# Patient Record
Sex: Female | Born: 1935 | Race: White | Hispanic: No | Smoking: Former smoker
Health system: Southern US, Community
[De-identification: ages and names within clinical notes are randomized; demographics above are authoritative.]

## PROBLEM LIST (undated history)

## (undated) DIAGNOSIS — M549 Dorsalgia, unspecified: Secondary | ICD-10-CM

## (undated) DIAGNOSIS — G8929 Other chronic pain: Secondary | ICD-10-CM

## (undated) DIAGNOSIS — T7840XA Allergy, unspecified, initial encounter: Secondary | ICD-10-CM

## (undated) DIAGNOSIS — G43909 Migraine, unspecified, not intractable, without status migrainosus: Secondary | ICD-10-CM

## (undated) DIAGNOSIS — I82401 Acute embolism and thrombosis of unspecified deep veins of right lower extremity: Secondary | ICD-10-CM

## (undated) DIAGNOSIS — K644 Residual hemorrhoidal skin tags: Secondary | ICD-10-CM

## (undated) DIAGNOSIS — R350 Frequency of micturition: Secondary | ICD-10-CM

## (undated) DIAGNOSIS — S060X0A Concussion without loss of consciousness, initial encounter: Secondary | ICD-10-CM

## (undated) DIAGNOSIS — R600 Localized edema: Secondary | ICD-10-CM

## (undated) DIAGNOSIS — Z5189 Encounter for other specified aftercare: Secondary | ICD-10-CM

## (undated) DIAGNOSIS — I714 Abdominal aortic aneurysm, without rupture, unspecified: Secondary | ICD-10-CM

## (undated) DIAGNOSIS — E7800 Pure hypercholesterolemia, unspecified: Secondary | ICD-10-CM

## (undated) DIAGNOSIS — I482 Chronic atrial fibrillation, unspecified: Secondary | ICD-10-CM

## (undated) DIAGNOSIS — Z8719 Personal history of other diseases of the digestive system: Secondary | ICD-10-CM

## (undated) DIAGNOSIS — C679 Malignant neoplasm of bladder, unspecified: Secondary | ICD-10-CM

## (undated) DIAGNOSIS — R519 Other chronic pain: Secondary | ICD-10-CM

## (undated) DIAGNOSIS — M069 Rheumatoid arthritis, unspecified: Secondary | ICD-10-CM

## (undated) DIAGNOSIS — K760 Fatty (change of) liver, not elsewhere classified: Secondary | ICD-10-CM

## (undated) DIAGNOSIS — J189 Pneumonia, unspecified organism: Secondary | ICD-10-CM

## (undated) DIAGNOSIS — Z8489 Family history of other specified conditions: Secondary | ICD-10-CM

## (undated) DIAGNOSIS — I2510 Atherosclerotic heart disease of native coronary artery without angina pectoris: Secondary | ICD-10-CM

## (undated) DIAGNOSIS — F419 Anxiety disorder, unspecified: Secondary | ICD-10-CM

## (undated) DIAGNOSIS — K219 Gastro-esophageal reflux disease without esophagitis: Secondary | ICD-10-CM

## (undated) DIAGNOSIS — R51 Headache: Secondary | ICD-10-CM

## (undated) DIAGNOSIS — N390 Urinary tract infection, site not specified: Secondary | ICD-10-CM

## (undated) DIAGNOSIS — K5790 Diverticulosis of intestine, part unspecified, without perforation or abscess without bleeding: Secondary | ICD-10-CM

## (undated) DIAGNOSIS — K5792 Diverticulitis of intestine, part unspecified, without perforation or abscess without bleeding: Secondary | ICD-10-CM

## (undated) HISTORY — DX: Chronic atrial fibrillation, unspecified: I48.20

## (undated) HISTORY — PX: BREAST CYST EXCISION: SHX579

## (undated) HISTORY — DX: Allergy, unspecified, initial encounter: T78.40XA

## (undated) HISTORY — PX: TUMOR EXCISION: SHX421

## (undated) HISTORY — DX: Diverticulosis of intestine, part unspecified, without perforation or abscess without bleeding: K57.90

## (undated) HISTORY — DX: Residual hemorrhoidal skin tags: K64.4

## (undated) HISTORY — DX: Fatty (change of) liver, not elsewhere classified: K76.0

## (undated) HISTORY — DX: Abdominal aortic aneurysm, without rupture, unspecified: I71.40

## (undated) HISTORY — PX: CARDIAC CATHETERIZATION: SHX172

## (undated) HISTORY — DX: Localized edema: R60.0

## (undated) HISTORY — PX: INCONTINENCE SURGERY: SHX676

## (undated) HISTORY — PX: TRANSTHORACIC ECHOCARDIOGRAM: SHX275

## (undated) HISTORY — PX: JOINT REPLACEMENT: SHX530

## (undated) HISTORY — PX: TRANSURETHRAL RESECTION OF BLADDER TUMOR WITH GYRUS (TURBT-GYRUS): SHX6458

## (undated) HISTORY — DX: Abdominal aortic aneurysm, without rupture: I71.4

---
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
## 1958-12-13 DIAGNOSIS — IMO0001 Reserved for inherently not codable concepts without codable children: Secondary | ICD-10-CM

## 1958-12-13 DIAGNOSIS — Z5189 Encounter for other specified aftercare: Secondary | ICD-10-CM

## 1958-12-13 HISTORY — PX: VAGINAL HYSTERECTOMY: SUR661

## 1958-12-13 HISTORY — DX: Encounter for other specified aftercare: Z51.89

## 1958-12-13 HISTORY — DX: Reserved for inherently not codable concepts without codable children: IMO0001

## 1990-12-13 HISTORY — PX: CATARACT EXTRACTION W/ INTRAOCULAR LENS  IMPLANT, BILATERAL: SHX1307

## 1998-05-26 ENCOUNTER — Other Ambulatory Visit: Admission: RE | Admit: 1998-05-26 | Discharge: 1998-05-26 | Payer: Self-pay | Admitting: *Deleted

## 1998-06-02 ENCOUNTER — Ambulatory Visit (HOSPITAL_COMMUNITY): Admission: RE | Admit: 1998-06-02 | Discharge: 1998-06-02 | Payer: Self-pay | Admitting: *Deleted

## 1999-03-03 ENCOUNTER — Ambulatory Visit (HOSPITAL_COMMUNITY): Admission: RE | Admit: 1999-03-03 | Discharge: 1999-03-03 | Payer: Self-pay | Admitting: Internal Medicine

## 2000-09-28 ENCOUNTER — Inpatient Hospital Stay (HOSPITAL_COMMUNITY): Admission: EM | Admit: 2000-09-28 | Discharge: 2000-10-03 | Payer: Self-pay | Admitting: Emergency Medicine

## 2000-09-28 ENCOUNTER — Encounter: Payer: Self-pay | Admitting: Internal Medicine

## 2000-10-25 ENCOUNTER — Encounter: Payer: Self-pay | Admitting: *Deleted

## 2000-10-25 ENCOUNTER — Ambulatory Visit (HOSPITAL_COMMUNITY): Admission: RE | Admit: 2000-10-25 | Discharge: 2000-10-25 | Payer: Self-pay | Admitting: *Deleted

## 2001-08-08 ENCOUNTER — Emergency Department (HOSPITAL_COMMUNITY): Admission: EM | Admit: 2001-08-08 | Discharge: 2001-08-08 | Payer: Self-pay | Admitting: Emergency Medicine

## 2001-08-08 ENCOUNTER — Encounter: Payer: Self-pay | Admitting: Emergency Medicine

## 2001-08-18 ENCOUNTER — Ambulatory Visit (HOSPITAL_COMMUNITY): Admission: RE | Admit: 2001-08-18 | Discharge: 2001-08-18 | Payer: Self-pay | Admitting: Family Medicine

## 2001-08-18 ENCOUNTER — Encounter: Payer: Self-pay | Admitting: Family Medicine

## 2001-11-02 ENCOUNTER — Ambulatory Visit (HOSPITAL_COMMUNITY): Admission: RE | Admit: 2001-11-02 | Discharge: 2001-11-02 | Payer: Self-pay | Admitting: Family Medicine

## 2001-11-02 ENCOUNTER — Encounter: Payer: Self-pay | Admitting: Family Medicine

## 2002-11-16 ENCOUNTER — Encounter: Payer: Self-pay | Admitting: Family Medicine

## 2002-11-16 ENCOUNTER — Ambulatory Visit (HOSPITAL_COMMUNITY): Admission: RE | Admit: 2002-11-16 | Discharge: 2002-11-16 | Payer: Self-pay | Admitting: Family Medicine

## 2003-12-14 DIAGNOSIS — I82401 Acute embolism and thrombosis of unspecified deep veins of right lower extremity: Secondary | ICD-10-CM

## 2003-12-14 HISTORY — PX: KNEE ARTHROSCOPY: SHX127

## 2003-12-14 HISTORY — DX: Acute embolism and thrombosis of unspecified deep veins of right lower extremity: I82.401

## 2004-03-31 DIAGNOSIS — S060X0A Concussion without loss of consciousness, initial encounter: Secondary | ICD-10-CM

## 2004-03-31 HISTORY — DX: Concussion without loss of consciousness, initial encounter: S06.0X0A

## 2004-04-30 ENCOUNTER — Inpatient Hospital Stay (HOSPITAL_COMMUNITY): Admission: EM | Admit: 2004-04-30 | Discharge: 2004-05-06 | Payer: Self-pay | Admitting: Emergency Medicine

## 2004-05-06 ENCOUNTER — Inpatient Hospital Stay (HOSPITAL_COMMUNITY)
Admission: RE | Admit: 2004-05-06 | Discharge: 2004-05-15 | Payer: Self-pay | Admitting: Physical Medicine & Rehabilitation

## 2004-05-21 ENCOUNTER — Encounter
Admission: RE | Admit: 2004-05-21 | Discharge: 2004-07-20 | Payer: Self-pay | Admitting: Physical Medicine & Rehabilitation

## 2004-06-22 ENCOUNTER — Encounter
Admission: RE | Admit: 2004-06-22 | Discharge: 2004-09-20 | Payer: Self-pay | Admitting: Physical Medicine & Rehabilitation

## 2004-06-26 ENCOUNTER — Inpatient Hospital Stay (HOSPITAL_COMMUNITY): Admission: EM | Admit: 2004-06-26 | Discharge: 2004-06-29 | Payer: Self-pay | Admitting: Emergency Medicine

## 2004-06-26 ENCOUNTER — Encounter: Payer: Self-pay | Admitting: Internal Medicine

## 2004-10-01 ENCOUNTER — Ambulatory Visit (HOSPITAL_COMMUNITY): Admission: RE | Admit: 2004-10-01 | Discharge: 2004-10-01 | Payer: Self-pay | Admitting: Internal Medicine

## 2004-10-12 ENCOUNTER — Encounter: Admission: RE | Admit: 2004-10-12 | Discharge: 2004-12-11 | Payer: Self-pay | Admitting: Orthopedic Surgery

## 2004-10-16 ENCOUNTER — Encounter: Admission: RE | Admit: 2004-10-16 | Discharge: 2004-10-16 | Payer: Self-pay | Admitting: Internal Medicine

## 2004-11-19 ENCOUNTER — Ambulatory Visit (HOSPITAL_COMMUNITY): Admission: RE | Admit: 2004-11-19 | Discharge: 2004-11-19 | Payer: Self-pay | Admitting: Internal Medicine

## 2005-03-29 ENCOUNTER — Encounter: Admission: RE | Admit: 2005-03-29 | Discharge: 2005-05-27 | Payer: Self-pay | Admitting: Orthopedic Surgery

## 2005-09-24 ENCOUNTER — Ambulatory Visit (HOSPITAL_COMMUNITY): Admission: RE | Admit: 2005-09-24 | Discharge: 2005-09-24 | Payer: Self-pay | Admitting: Internal Medicine

## 2006-08-08 ENCOUNTER — Encounter: Admission: RE | Admit: 2006-08-08 | Discharge: 2006-08-08 | Payer: Self-pay | Admitting: Obstetrics and Gynecology

## 2006-09-15 ENCOUNTER — Encounter (INDEPENDENT_AMBULATORY_CARE_PROVIDER_SITE_OTHER): Payer: Self-pay | Admitting: Specialist

## 2006-09-15 ENCOUNTER — Ambulatory Visit (HOSPITAL_COMMUNITY): Admission: RE | Admit: 2006-09-15 | Discharge: 2006-09-15 | Payer: Self-pay | Admitting: Urology

## 2007-03-28 ENCOUNTER — Encounter: Admission: RE | Admit: 2007-03-28 | Discharge: 2007-04-24 | Payer: Self-pay | Admitting: Orthopedic Surgery

## 2007-04-20 ENCOUNTER — Encounter: Admission: RE | Admit: 2007-04-20 | Discharge: 2007-04-20 | Payer: Self-pay | Admitting: Internal Medicine

## 2007-09-18 ENCOUNTER — Inpatient Hospital Stay (HOSPITAL_COMMUNITY): Admission: EM | Admit: 2007-09-18 | Discharge: 2007-09-19 | Payer: Self-pay | Admitting: Emergency Medicine

## 2007-09-19 ENCOUNTER — Encounter (INDEPENDENT_AMBULATORY_CARE_PROVIDER_SITE_OTHER): Payer: Self-pay | Admitting: Neurology

## 2007-10-30 ENCOUNTER — Ambulatory Visit: Payer: Self-pay | Admitting: Internal Medicine

## 2007-11-20 ENCOUNTER — Ambulatory Visit: Payer: Self-pay | Admitting: Internal Medicine

## 2007-11-20 ENCOUNTER — Other Ambulatory Visit: Admission: RE | Admit: 2007-11-20 | Discharge: 2007-11-20 | Payer: Self-pay | Admitting: Internal Medicine

## 2007-11-20 ENCOUNTER — Encounter: Payer: Self-pay | Admitting: Internal Medicine

## 2007-12-19 ENCOUNTER — Ambulatory Visit (HOSPITAL_COMMUNITY): Admission: RE | Admit: 2007-12-19 | Discharge: 2007-12-19 | Payer: Self-pay | Admitting: Internal Medicine

## 2008-01-26 DIAGNOSIS — K5730 Diverticulosis of large intestine without perforation or abscess without bleeding: Secondary | ICD-10-CM | POA: Insufficient documentation

## 2008-01-26 DIAGNOSIS — K644 Residual hemorrhoidal skin tags: Secondary | ICD-10-CM | POA: Insufficient documentation

## 2008-01-26 DIAGNOSIS — K2940 Chronic atrophic gastritis without bleeding: Secondary | ICD-10-CM | POA: Insufficient documentation

## 2008-01-26 DIAGNOSIS — B3781 Candidal esophagitis: Secondary | ICD-10-CM | POA: Insufficient documentation

## 2008-01-26 DIAGNOSIS — D126 Benign neoplasm of colon, unspecified: Secondary | ICD-10-CM | POA: Insufficient documentation

## 2008-01-26 DIAGNOSIS — K449 Diaphragmatic hernia without obstruction or gangrene: Secondary | ICD-10-CM | POA: Insufficient documentation

## 2008-04-30 ENCOUNTER — Emergency Department (HOSPITAL_COMMUNITY): Admission: EM | Admit: 2008-04-30 | Discharge: 2008-04-30 | Payer: Self-pay | Admitting: Emergency Medicine

## 2008-05-23 ENCOUNTER — Ambulatory Visit (HOSPITAL_COMMUNITY): Admission: RE | Admit: 2008-05-23 | Discharge: 2008-05-23 | Payer: Self-pay | Admitting: Urology

## 2008-05-23 ENCOUNTER — Encounter: Payer: Self-pay | Admitting: Urology

## 2008-07-22 ENCOUNTER — Emergency Department (HOSPITAL_COMMUNITY): Admission: EM | Admit: 2008-07-22 | Discharge: 2008-07-22 | Payer: Self-pay | Admitting: Emergency Medicine

## 2008-07-24 ENCOUNTER — Ambulatory Visit (HOSPITAL_COMMUNITY): Admission: RE | Admit: 2008-07-24 | Discharge: 2008-07-24 | Payer: Self-pay | Admitting: Internal Medicine

## 2009-01-30 ENCOUNTER — Ambulatory Visit (HOSPITAL_COMMUNITY): Admission: RE | Admit: 2009-01-30 | Discharge: 2009-01-30 | Payer: Self-pay | Admitting: Internal Medicine

## 2009-02-06 ENCOUNTER — Ambulatory Visit (HOSPITAL_COMMUNITY): Admission: RE | Admit: 2009-02-06 | Discharge: 2009-02-06 | Payer: Self-pay | Admitting: Obstetrics and Gynecology

## 2009-04-06 ENCOUNTER — Emergency Department (HOSPITAL_COMMUNITY): Admission: EM | Admit: 2009-04-06 | Discharge: 2009-04-06 | Payer: Self-pay | Admitting: Family Medicine

## 2009-06-09 ENCOUNTER — Emergency Department (HOSPITAL_COMMUNITY): Admission: EM | Admit: 2009-06-09 | Discharge: 2009-06-10 | Payer: Self-pay | Admitting: Emergency Medicine

## 2009-06-17 ENCOUNTER — Ambulatory Visit (HOSPITAL_COMMUNITY): Admission: RE | Admit: 2009-06-17 | Discharge: 2009-06-17 | Payer: Self-pay | Admitting: Urology

## 2009-06-17 ENCOUNTER — Encounter: Payer: Self-pay | Admitting: Urology

## 2009-08-09 ENCOUNTER — Encounter: Admission: RE | Admit: 2009-08-09 | Discharge: 2009-08-09 | Payer: Self-pay | Admitting: Orthopedic Surgery

## 2010-02-04 ENCOUNTER — Encounter: Admission: RE | Admit: 2010-02-04 | Discharge: 2010-02-04 | Payer: Self-pay | Admitting: Internal Medicine

## 2010-11-03 ENCOUNTER — Encounter: Admission: RE | Admit: 2010-11-03 | Discharge: 2010-11-03 | Payer: Self-pay | Admitting: Internal Medicine

## 2010-12-13 DIAGNOSIS — Z8489 Family history of other specified conditions: Secondary | ICD-10-CM

## 2010-12-13 HISTORY — DX: Family history of other specified conditions: Z84.89

## 2011-01-02 ENCOUNTER — Other Ambulatory Visit: Payer: Self-pay | Admitting: Internal Medicine

## 2011-01-02 DIAGNOSIS — Z1239 Encounter for other screening for malignant neoplasm of breast: Secondary | ICD-10-CM

## 2011-01-03 ENCOUNTER — Encounter: Payer: Self-pay | Admitting: Internal Medicine

## 2011-02-05 ENCOUNTER — Ambulatory Visit
Admission: RE | Admit: 2011-02-05 | Discharge: 2011-02-05 | Disposition: A | Discharge status: Home / Self Care | Payer: Medicare Other | Source: Ambulatory Visit | Attending: Internal Medicine | Admitting: Internal Medicine

## 2011-02-05 DIAGNOSIS — Z1239 Encounter for other screening for malignant neoplasm of breast: Secondary | ICD-10-CM

## 2011-03-21 LAB — BASIC METABOLIC PANEL
BUN: 11 mg/dL (ref 6–23)
CO2: 32 mEq/L (ref 19–32)
Chloride: 100 mEq/L (ref 96–112)
GFR calc non Af Amer: >60 mL/min (ref >60)
Glucose, Bld: 103 mg/dL — ABNORMAL HIGH (ref 70–99)
Potassium: 4.1 mEq/L (ref 3.5–5.1)
Sodium: 141 mEq/L (ref 135–145)

## 2011-03-21 LAB — PROTIME-INR
INR: 0.9 (ref 0.00–1.49)
Prothrombin Time: 12.7 seconds (ref 11.6–15.2)

## 2011-03-21 LAB — APTT: aPTT: 22 seconds — ABNORMAL LOW (ref 24–37)

## 2011-03-22 LAB — CBC
Hemoglobin: 14.9 g/dL (ref 12.0–15.0)
Platelets: 201 10*3/uL (ref 150–400)
RDW: 14.3 % (ref 11.5–15.5)
WBC: 7.6 10*3/uL (ref 4.0–10.5)

## 2011-03-22 LAB — POCT CARDIAC MARKERS
CKMB, poc: 1.9 ng/mL (ref 1.0–8.0)
Myoglobin, poc: 55.2 ng/mL (ref 12–200)

## 2011-03-22 LAB — URINALYSIS, ROUTINE W REFLEX MICROSCOPIC
Nitrite: NEGATIVE
Protein, ur: NEGATIVE mg/dL
Specific Gravity, Urine: 1.003 — ABNORMAL LOW (ref 1.005–1.030)
Urobilinogen, UA: 0.2 mg/dL (ref 0.0–1.0)

## 2011-03-22 LAB — POCT I-STAT, CHEM 8
HCT: 45 % (ref 36.0–46.0)
Hemoglobin: 15.3 g/dL — ABNORMAL HIGH (ref 12.0–15.0)
Potassium: 3.7 mEq/L (ref 3.5–5.1)
Sodium: 137 mEq/L (ref 135–145)

## 2011-03-22 LAB — DIFFERENTIAL
Basophils Absolute: 0 10*3/uL (ref 0.0–0.1)
Lymphocytes Relative: 30 % (ref 12–46)
Lymphs Abs: 2.3 10*3/uL (ref 0.7–4.0)
Neutro Abs: 4.2 10*3/uL (ref 1.7–7.7)

## 2011-03-22 LAB — URINE CULTURE

## 2011-03-22 LAB — URINE MICROSCOPIC-ADD ON

## 2011-03-22 LAB — PROTIME-INR: Prothrombin Time: 27.6 seconds — ABNORMAL HIGH (ref 11.6–15.2)

## 2011-04-27 NOTE — Discharge Summary (Signed)
[name], [name]           ACCOUNT NO.:  [id number]   MEDICAL RECORD NO.:  [id number]          PATIENT TYPE:  INP   LOCATION:  3737                         FACILITY:  [facility]   PHYSICIAN:  Pramod P. Pearlean Brownie, MD    DATE OF BIRTH:  06/25/1921   DATE OF ADMISSION:  09/18/2007  DATE OF DISCHARGE:  09/19/2007                               DISCHARGE SUMMARY   DISCHARGE DIAGNOSES:  1. Concussion following a fall negative stroke workup.  2. Hypokalemia.  3. Atrial fibrillation on Coumadin.  4. Closed head injury May 2005, with subarachnoid hemorrhage in      frontal area.  Rib fractures also occurred at this time.  5. Gastroesophageal reflux disease.  6. Anemia.  7. Degenerative arthritis.  8. Hematuria with bladder tumor that was resected.  9. Colon polyps.  10.Diverticulosis. next  11.Hysterectomy.  12.Cataract surgery.  13.Tonsillectomy.  14.Bladder resuspension procedure.  15.Lipoma resection.   DISCHARGE MEDICATIONS:  1. Prevacid 30 mg a day.  2. Coumadin 7.5 mg Monday, Wednesday, Friday and 5 mg other days.  3. Crestor 10 mg a day.  4. Cardizem 240 CD one a day.   STUDIES PERFORMED.:  1. CT of the brain on admission shows no acute abnormality.  2. MRI of the brain shows no acute abnormality.  No change from MRI on      Apr 20, 2007.  3. Carotid Doppler shows no ICA stenosis.  4. A 2-D echocardiogram performed, results pending.  5. Transcranial Doppler performed, results pending.  6. EKG is not present in chart.   LABORATORY STUDIES:  CBC normal.  Chemistry with potassium 3.3, glucose  114, otherwise normal.  PT 24.6, INR 2.1, PTT 53.  Liver function tests  normal.  Albumin 3.1.  Cardiac enzymes negative.  Urinalysis with 0-2  white blood cells, 7-10 red blood cells and trace leukocyte esterase.  Hemoglobin A1c was 6.3.   HISTORY OF PRESENT ILLNESS:  The patient is an 77 year old right-handed  female with a history of atrial fibrillation, currently treated with  Coumadin.  The patient apparently fell while gardening tonight around  7:15 p.m., striking the back of her head.  The patient did not lose  consciousness.  Due to the fact that she is on Coumadin, her daughter  brought her to the emergency room for evaluation.  On the way to the  hospital, the patient was noted to have some transient left facial  droop, decreased grip with the left hand and slurred speech.  By the  time she arrived at the emergency room this had cleared.  CT of the  brain showed no acute abnormality.  There was an old left deep frontal  white matter lesions seen consistent with old stroke event.  The patient  does have a history of closed head injury that involved a frontal  subarachnoid hemorrhage and the patient had some residual left-sided  weakness following that event.  Her INR in the emergency room was at  2.2.  NIH stroke scale was zero.  TPA was not considered secondary to  rapid improvement, stroke severity too mild as well as therapeutic  Coumadin level.  The patient was admitted to the hospital for further  evaluation.   HOSPITAL COURSE:  MRI was negative for acute stroke.  In reviewing the  history, it is really not sounding like a stroke associated history.  Feelings are that the patient had a concussion after her fall in the  garden.  She has no PT, OT deficits.  No need for any type of rehab.  Her stroke workup was essentially normal.  Of note, she did have some  mild hyperkalemia on admission in the hospital and potassium supplement  was given.  She is stable for discharge home.   CONDITION ON DISCHARGE:  The patient alert and oriented x3.  No aphasia.  No dysarthria.  Eye movements are full.  Visual fields are full.  Face  is symmetric.  She has no focal neurologic deficits.  No extremity  weakness and no ataxia.   DISCHARGE/PLAN:  1. Discharge home with family.  2. Continue Coumadin for stroke prevention.  3. Follow up with primary care physician as  needed.  4. No neurologic follow-up necessary.      Annie Main, N.P.    ______________________________  Sunny Schlein. Pearlean Brownie, MD    SB/MEDQ  D:  09/19/2007  T:  09/20/2007  Job:  [id number]   cc:   Juline Patch, M.D.

## 2011-04-27 NOTE — H&P (Signed)
NAMEMarland Kitchen  [name], [name]:  [id number]   MEDICAL RECORD NO.:  [id number]          PATIENT TYPE:  INP   LOCATION:  3737                         FACILITY:  [facility]   PHYSICIAN:  [name], M.D.  DATE OF BIRTH:  01-Aug-1921   DATE OF ADMISSION:  09/18/2007  DATE OF DISCHARGE:                              HISTORY & PHYSICAL   HISTORY OF PRESENT ILLNESS:  [name] is an 77 year old right-  handed white female born 01-28-1921 with a history of atrial  fibrillation who is currently treated with Coumadin.  The patient  apparently fell while gardening tonight at around 7:15 p.m., striking  the back of her head.  The patient did not lose consciousness.  Due to  the fact that she is on Coumadin, her daughter brought her to the  emergency room for evaluation.  On the way to the hospital, the patient  was noted to have some transient left facial droop, decreased grip with  the left hand, slurred speech.  By the time she arrived in the emergency  room, this had essentially cleared.  CT scan of the brain shows no acute  changes.  An old left deep frontal white matter lesion is seen,  consistent with an old stroke event.  The patient does have a history of  closed head injury that involved a frontal subarachnoid hemorrhage, and  the patient had some residual left-sided weakness following that event.  INR is 2.2.  The patient comes in as a code stroke.  NIH stroke scale is  0.   PAST MEDICAL HISTORY:  1. History of transient left facial droop, left hand weakness,      possible TIA.  2. Atrial fibrillation on Coumadin.  3. Closed head injury in May of 2005 with subarachnoid hemorrhage in      the frontal area.  Rib fractures also occurred with the fall.  4. Gastroesophageal reflux disease.  5. Anemia.  6. Degenerative arthritis.  7. Hematuria with bladder tumor that was resected.  8. Colon polyps.  9. Diverticulosis.  10.Hysterectomy.  11.History of  cataract surgery.  12.Tonsillectomy.  13.Bladder resuspension procedure.  14.Lipoma resection.   MEDICATIONS:  1. Prevacid 30 mg daily.  2. Coumadin 7.5 mg Monday, Wednesday, Friday and 5 mg on all other      days.  3. Crestor, I believe at 10 mg daily.  4. I believe the patient is also on Cardizem 240 mg CD tablet one      daily.   HABITS:  The patient does not smoke and drinks a glass of red wine every  night.   ALLERGIES:  PENICILLIN AND IVP DYE.   SOCIAL HISTORY:  This patient lives in the [place], [place] area.  She is a widow, has three children who are alive and well.  She has one  son and two daughters.  The patient has retired.   FAMILY HISTORY:  Mother died with cancer.  Father died of old age.  The patient has 10 brothers and sisters.  There is history of heart  disease and cancer running in the family.  REVIEW OF SYSTEMS:  Notable for no recent fevers, chills.  The patient  does note a slight headache following the fall.  Denies any visual field  disturbance, loss of vision, double vision.  Denies speech problems at  this point, trouble swallowing.  Denies neck pain.  She does have some  shortness of breath at times.  She denies chest pains.  Denies any  abdominal pain.  Has had some bladder control problems with some  occasional incontinence.  Denies any numbness or weakness at this point.  Has no dizziness or blackout episodes.   PHYSICAL EXAMINATION:  VITAL SIGNS:  Blood pressure is 163/91, heart  rate 77, respiratory 18, temperature afebrile.  GENERAL:  The patient is a fairly well-developed elderly white female  who is alert and cooperative at the time of the examination.  HEENT:  Head is atraumatic.  Eyes:  Pupils are equal, round, postsurgical, react  to light.  Disks soft and flat bilaterally.  NECK:  Supple.  No carotid bruits noted.  RESPIRATORY:  Clear.  CARDIOVASCULAR:  Regular rate and rhythm.  No obvious murmurs or rubs  noted.   EXTREMITIES:  Without significant edema.  1+ edema is present.  ABDOMEN:  Positive bowel sounds.  No organomegaly or tenderness noted.  NEUROLOGIC:  Cranial nerves as above.  Facial symmetry is present.  The  patient has good sensation of the face to pinprick and soft touch  bilaterally.  Has good strength of the facial muscles and muscles of  head turning and shrug bilaterally.  Speech is well- enunciated, not  aphasic.  Motor testing reveals 5/5 strength in all fours, good  symmetric motor tone is noted throughout.  Sensory testing is intact to  pinprick, soft touch, and vibratory sensation throughout.  The patient  has good finger-nose-finger, heel-to-shin.  Gait was not tested.  Deep  tendon reflexes symmetric.  Normal toes neutral bilaterally.  No drift  is seen in the upper or lower extremities.   LABORATORY DATA:  White count of 8.0, hemoglobin 12.3, hematocrit 36.1,  MCV of 93.5, platelets of 297.  INR 2.2, sodium 133, potassium 3.5,  chloride of 99, CO2 26, glucose of 101, BUN of 12, creatinine 0.79.  Alkaline phosphatase of 59, total bilirubin 0.6, SGOT of 24, SGPT of 29,  total protein 6.6, albumin 3.1, calcium 8.6.  CK level is 110, MB  fraction 3.0, troponin I level 0.01.   CT of the head is as above.   IMPRESSION:  1. History of atrial fibrillation on Coumadin.  2. Status post fall.  3. Transient left facial droop, left grip strength weakness, and      slurred speech; possible right brain transient ischemic attack.   This patient has [employer] stroke scale of 0 at this  point.  The patient is not a tissue plasminogen activator candidate due  to minimal deficit and due to anticoagulation.  The patient will be  admitted for further evaluation.   PLAN:  1. Admission to [facility].  2. MRI of the brain.  3. MRI angiogram of the intracranial and extracranial vessels.  4. 2-D echocardiogram.  5. Continue Coumadin therapy.  6. IV fluid  hydration.   I will follow the patient's clinical course while in-house.      [name], M.D.  Electronically Signed     CKW/MEDQ  D:  09/18/2007  T:  09/19/2007  Job:  [id number]   cc:   [name], M.D.  [facility]

## 2011-04-27 NOTE — Op Note (Signed)
NAMEARRIE, BORRELLI NO.:  [id number]   MEDICAL RECORD NO.:  [id number]          PATIENT TYPE:  AMB   LOCATION:  DAY                          FACILITY:  [facility]   PHYSICIAN:  Excell Seltzer. Annabell Howells, M.D.    DATE OF BIRTH:  January 16, 1921   DATE OF PROCEDURE:  06/17/2009  DATE OF DISCHARGE:                               OPERATIVE REPORT   PROCEDURES:  1. Cystoscopy.  2. Bilateral retrograde pyelograms with interpretation.  3. Biopsy and fulguration of a 2-3-cm tumor from the dome and a 2-cm      lesion from the bladder neck.  4. Instillation of mitomycin C 40 mg in 40 cc.   PREOPERATIVE DIAGNOSIS:  Recurrent bladder tumor.   POSTOPERATIVE DIAGNOSIS:  Recurrent bladder tumor with probable TA low-  grade disease.   SURGEON:  Dr. Bjorn Pippin   ANESTHESIA:  General.   SPECIMEN:  Biopsies from the dome and bladder neck.   COMPLICATIONS:  None.   DRAIN:  20-French Foley.   INDICATIONS:  Ms. 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  She also has a CONTRAST allergy.  She was recently found to have  recurrent bladder tumors on office cystoscopy.  It was felt that upper  tract studies and biopsy of the lesions was indicated.   FINDINGS AND PROCEDURE:  The patient was given Cipro.  She was taken  operating room, where general anesthetic was induced.  She was placed in  lithotomy position.  Her perineum and genitalia were prepped with  Betadine solution.  She was draped in usual sterile fashion.  Cystoscopy  was performed using a 22-French scope and 12 and 70-degrees lenses.  Examination revealed a normal urethra.  However, at the bladder neck at  12 o'clock there was some shagginess of the mucosa suggestive of  recurrent tumor.  It had a relatively low-grade appearance.  Also on the  dome there was about a 3-cm contiguous area of low papillary appearing  lesions also suggestive of low-grade  superficial disease.  The ureteral  orifices were unremarkable.  Prior scar was noted on the posterior wall  of the bladder.   Once cystoscopy had been performed, bilateral retrograde pyelograms were  performed using a 5-French open-end catheter and Omnipaque.   The right retrograde pyelogram was remarkable for no filling defects,  hydronephrosis or other abnormalities.   The left retrograde pyelogram was remarkable for no filling defects,  hydronephrosis or other abnormalities.   Once the retrograde pyelograms were performed, a cup biopsy was used to  take three biopsies from the tumor on the dome, followed by a single  biopsy from the area at the bladder neck.  Once the biopsies had been  obtained, a resectoscope sheath was inserted and a standard loop was  used to widely fulgurate the residual tumor burden on the dome with an  area of approximately 3 cm, and at the bladder neck with diameter  approximately 2 cm.  Once fulguration had been completed and all visible  tumor destroyed, the bladder was drained and the scope  was removed.   A 20-French Foley catheter was inserted.  Balloon was filled with 10 cc  sterile fluid and her bladder was instilled with 40 mg of mitomycin C in  40 cc of diluent.  The catheter was then plugged.  The mitomycin C will  be left indwelling for 30 minutes and the patient will go home with a  catheter.  Prescriptions for Cipro and Pyridium will be sent to the  pharmacy.      Excell Seltzer. Annabell Howells, M.D.  Electronically Signed     JJW/MEDQ  D:  06/17/2009  T:  06/17/2009  Job:  [id number]

## 2011-04-27 NOTE — Op Note (Signed)
NAMEMarland Chambers  AIRIANA, ELMAN NO.:  [id number]   MEDICAL RECORD NO.:  [id number]          PATIENT TYPE:  AMB   LOCATION:  DAY                          FACILITY:  [facility]   PHYSICIAN:  Excell Seltzer. Annabell Howells, M.D.    DATE OF BIRTH:  Jul 18, 1921   DATE OF PROCEDURE:  05/23/2008  DATE OF DISCHARGE:                               OPERATIVE REPORT   PREOPERATIVE DIAGNOSIS:  Presumed grade 1 TA bladder tumors.   PROCEDURE:  Cystoscopy, bilateral retrograde pyelograms with  interpretation and transurethral resection of three bladder tumors,  the  largest approximately 3 cm.   SURGEON:  Dr. Bjorn Pippin.   ANESTHESIA:  General.   SPECIMEN:  Bladder tumor chips.   DRAIN:  20-French Foley catheter.   COMPLICATIONS:  None.   INDICATIONS:  [name] is an 77 year old white female with history  of low grade superficial transitional cell carcinoma of the bladder.  Initially she was felt that recurrence in the area trigone and dome that  required resection.  She has a contrast allergy and upper tracts needed  evaluation so I felt that the retrograde pyelogram should be performed.   FINDINGS/PROCEDURE:  The patient was  given Cipro.  She had been off her  Coumadin and Lovenox an appropriate period prior to procedure and her  PT, PTT and INR were unremarkable.  She was fitted with PAS hose.  A  general anesthetic was induced.  She was placed in lithotomy position.  Her perineum and genitalia were prepped with Betadine solution and she  was draped in the usual sterile fashion.   Cystoscopy was performed using the 22-French scope and 12 degree lens.  Examination revealed normal urethra.  There was a flat, somewhat wavy  appearing area just above the trigone that measured approximately 3 cm  in diameter that was suspicious for recurrent transitional cell  carcinoma.  There was about a 1 cm lesion on the dome and after thorough  inspection, I also found a similar 1-1.5 cm lesion on the  posterior wall  just to the right of midline.  This was not noted on office cystoscopy.  The ureteral orifices were unremarkable.   The right ureteral orifice was cannulated with a 6-French open-end  catheter.  Contrast was instilled.   The right retrograde pyelogram was unremarkable with no filling defects  or hydronephrosis.   The left ureteral orifice was then cannulated with an open-end catheter.  Contrast was instilled.   The left retrograde pyelogram revealed a normal ureter and internal  pelvis without filling defects or hydronephrosis.   At this point, the cystoscope was removed and the 28-French continuous  flow resectoscope sheath was inserted.  The irrigant was glycine.  The  scope was fitted with an Wandra Scot handle with a standard loop and 12  degree lens.  The Bovie was set on bladder tumor setting.  The lesion in  the trigone was resected and it appeared we were able to get muscle in  the specimen.  Once the tumor been resected, the margins were  fulgurated.  The lesion on the dome was not in a good  location to resect  and being that was flat, I felt that generous fulguration was adequate.  This was performed.  Finally, the lesion on the posterior wall was  resected with the tumor being shaved off the bladder wall since there  was little intravesical component and it was primarily a flat tumor, but  with a low grade appearance.  Once this had been resected, the bed and  margins were fulgurated with the resectoscope.  At this point, the  bladder was evacuated free of the tumor chips and a 20-French 5 mL  catheter was placed.  The balloon was filled with 10 mL of sterile  fluid.  The urine returned clear.  The catheter was placed to straight  drainage.  The specimen was sent to pathology.  The patient was taken  down from lithotomy position.  Her anesthetic was reversed.  She was  moved to the recovery room in stable condition.  There were no  complications.       Excell Seltzer. Annabell Howells, M.D.  Electronically Signed     JJW/MEDQ  D:  05/23/2008  T:  05/23/2008  Job:  [id number]   cc:   Juline Patch, M.D.  Fax: [phone]

## 2011-04-27 NOTE — Assessment & Plan Note (Signed)
[name]                         GASTROENTEROLOGY OFFICE NOTE   NAME:WHITFIELDPatria, [name]                    MRN:          [id number]  DATE:10/30/2007                            DOB:          05-31-1921    [name] is a very nice 77 year old white female who is here today  to schedule for a colonoscopy and an endoscopy.  She has a history of  adenomatous polyp of the colon which was removed on colonoscopy in 2000.  On the last colonoscopy in November 2003, she had moderately severe  diverticulosis of the left and of the right colon with internal  hemorrhoids.  She had no recurrent polyps.  She is now complaining of  abdominal pain from left lower quadrant to right lower quadrant.  She  has been constipated.  The patient also has a gastroesophageal reflux  which she treats with omeprazole 20 mg daily.  She denies any dysphagia  or odynophagia.  On upper endoscopy on November 02, 2002, the patient  was found to have a hiatal hernia and mild gastritis which was H. pylori  negative.   MEDICATIONS:  1. Diltiazem CD __________ mg 1 p.o. daily.  2. Crestor 5 mg p.o. daily.  3. Coumadin 5 mg Tuesday, Thursday, Saturday, Sunday, and 7.5 mg      Monday, Wednesday, Friday.  4. Furosemide 20 mg p.o. daily.  5. Omeprazole 20 mg daily.  6. Klor-Con 20 mEq daily.  7. Zyrtec 10 mg daily.   PAST MEDICAL HISTORY:  Significant for DVT about 2 or 3 years ago after  an accident.  She injured her knee and back of her head.  She has been  on Coumadin except for several days last year while having bladder  surgery.  She had to go on Lovenox for 5 days prior to the surgery.  She  also has allergies.  Past history is significant for hysterectomy and  breast biopsies.   FAMILY HISTORY:  Negative for colon cancer, positive for prostate cancer  in her brother, breast cancer in a niece and heart disease in her  brother.   SOCIAL HISTORY:  She is widowed with 3  children.  She is a housewife,  she does not smoke and does not drink alcohol.   REVIEW OF SYSTEMS:  Positive for eyeglasses, swelling of her feet, skin  rashes, night sweats, blood in urine, itching, leakage of urine.   PHYSICAL EXAMINATION:  Blood pressure 130/78, pulse 64 and weight 162  pounds.  The patient was somewhat hard of hearing.  LUNGS:  Clear to auscultation.  COR:  Normal S1, normal S2.  ABDOMEN:  Soft with tenderness in left lower and middle quadrants.  There was also tenderness across above the umbilicus in the midline and  in the right lower quadrant.  There was no distention and bowel sounds  were normal.  RECTAL EXAM:  Shows small amount of Hemoccult negative stool in the  rectal ampulla.   IMPRESSION:  1. A 77 year old white female with history of adenomatous polyps who      is due for colonoscopy.  She is, however, on  Coumadin and will have      to stay on anticoagulants because of high risk of pulmonary      embolus.  2. Anticoagulation with Coumadin to therapeutic level due to risk of      deep venous thrombosis and pulmonary embolus.   PLAN:  Upper endoscopy and colonoscopy scheduled off Coumadin.  The  patient will restart her Lovenox 30 mg subcutaneously twice a day 5 days  prior to the procedure and will continue after the procedure until her  INR is between 2 and 3.  As far as her abdominal pain is concerned, I  think it may be related to constipation, but we will make sure that it  is not diverticulitis or some space-occupying lesion which needs to be  further evaluated.     Hedwig Morton. Juanda Chance, MD  Electronically Signed    DMB/MedQ  DD: 10/30/2007  DT: 10/30/2007  Job #: [id number]   cc:   Juline Patch, M.D.

## 2011-04-30 NOTE — H&P (Signed)
NAME:  [name], [name]                     ACCOUNT NO.:  [id number]   MEDICAL RECORD NO.:  [id number]                   PATIENT TYPE:  INP   LOCATION:  1832                                 FACILITY:  [facility]   PHYSICIAN:  Elliot Cousin, M.D.                 DATE OF BIRTH:  05-25-1921   DATE OF ADMISSION:  06/25/2004  DATE OF DISCHARGE:                                HISTORY & PHYSICAL   PRIMARY CARE PHYSICIAN:  Juline Patch, M.D.   CHIEF COMPLAINT:  Shortness of breath, transient chest pain and rapid heart  beat.   HISTORY OF PRESENT ILLNESS:  The patient is an 77 year old lady with a past  medical history significant for a traumatic fall on May 2005 causing a  closed head injury and small frontal subarachnoid hemorrhage, chronic  anemia, gastroesophageal reflux disease, and DJD who presents to the  emergency department with a chief complaint of a rapid heart beat, shortness  of breath and transient chest pain.  The patient states that years ago she  had on and off episodes of her heart fluttering.  It has not happened for  several years until yesterday.  At approximately 4:00 P.M. on June 25, 2004  the patient was sitting down at home and began to feel her heart race.  She  felt that her heart was racing so fast that it was making her whole body  shake.  She had some associated substernal chest discomfort and some  associated left arm numbness.  She called her grandson who then called EMS.  When the EMS arrived a telemetry strip revealed that the patient was in  atrial fibrillation with a heart rate of 140.  The patient was therefore  transferred to the emergency department.   On the way to the hospital the patient's chest discomfort and left arm  numbness resolved.  The sensation that her heart was racing had subsided.  The EMS report is not available; however, the patient does not believe she  received any medications en route to the hospital.   When the patient arrived at  the emergency department she was in no acute  distress.  She was afebrile.  Her blood pressure was 147/71, pulse 76,  respiratory rate 18 and oxygen saturation 94% on room air.  Her EKG revealed  normal sinus rhythm with nonspecific ST and T wave abnormalities with a  heart rate of 100.  Currently her heart rate is 69 bears/minute on the  monitor.  The patient currently denies any chest pain or shortness of  breath.   PAST MEDICAL HISTORY:  1. Traumatic fall in May 2005.     A. Closed head injury status post closure of a 2 cm left posterior scalp        laceration.     B. Brief loss of consciousness secondary to the fall.     C. Small frontal subarachnoid hemorrhage.  (Follow up CT  scan of the head        per Dr. Yetta Barre revealed resolution of the hemorrhage.)     D. Acute left rib fractures of ribs 6, 7, and 9.     E. Right lower extremity contusion secondary to fall.  2. Chronic anemia.  3. Gastroesophageal reflux disease.  4. Left hilar pneumonia in October 2001.  5. DJD.  6. History of chronic hematuria with a negative workup in the past.  7. History of colon polyps.  8. History of diverticulosis.  9. History of chest pain with negative workup in 1999.  10.      Status post total abdominal hysterectomy secondary to dysfunctional     uterine bleeding in the past.  11.      Status post tonsillectomy in the past.  12.      Status post lipoma excision from the back in the past.  13.      Status post bladder tacking in the past.  14.      Status post bilateral cataract surgery in the past.   MEDICATIONS:  1. Prilosec 20 mg daily.  2. Vitamin C 1 tablet daily.  3. Vitamin E 1 tablet daily.  4. Garlic pill twice daily.  5. Fish oil capsule 2 caps daily.  6. Iron pill b.i.d.  7. Multivitamin with iron 1 daily.   ALLERGIES:  PENICILLIN, which causes hives.   SOCIAL HISTORY:  The patient is widowed and she lives in [place].  Her youngest daughter lives with her.  She has  three children.  She is  retired from [employer]. [employer].  She smoked cigarettes approximately 15-20 years  before stopping approximately 30 years ago.  She drinks a glass of wine each  night.  She can read and write.  She ambulates with a cane.  She was driving  up until the accidental fall two months ago.   FAMILY HISTORY:  Patient's mother died of cervical cancer in her 31s.  Her  father died at 91 years of age secondary to old age.  Her youngest daughter  is 22 years of age and has a history of seizures.   REVIEW OF SYSTEMS:  The patient's review of systems is positive for  occasional dizziness, posterior headache, occasional neck pain, left-sided  chest pain from the rib fractures, constipation, and dark stools.  Her  review of systems is negative for visual changes, blackout spells within the  past four weeks, difficulty swallowing, pleurisy, cough, abdominal pain,  bright red blood per rectum, diarrhea, and painful urination.  No increase  in leg swelling or pain.  Her stools are dark from the iron.   PHYSICAL EXAMINATION:  VITAL SIGNS:  Temperature 97.2, blood pressure  147/71, pulse 67, respiratory rate 16, and oxygen saturation 94% on room  air.  GENERAL APPEARANCE:  In general the patient is a pleasant alert 77 year old  Caucasian woman who is currently sitting up in bed in no acute distress.  HEENT:  Head is normocephalic and nontraumatic.  I cannot detect any open  lacerations or hematomata on her scalp.  Her left pupil is irregular and  mildly dilated, but reacts to light.  The right pupil is round and reacts to  light.  Extraocular movements are intact.  Conjunctivae are clear.  Sclerae  are white.  Tympanic membranes are clear bilaterally.  Nasal mucosa is  moist.  Oropharynx reveals s full set of dentures.  No posterior exudates or  erythema.  Mucous  membranes are moist. NECK:  Neck is supple.  No adenopathy.  No thyromegaly.  No bruit.  No JVD.  LUNGS:  Lungs are clear to  auscultation with the exception of a rare crackle  in the bases.  Breathing is nonlabored.  No pleurisy.  HEART:  S1 and S2 with a 1-2/6 systolic murmur.  ABDOMEN:  Positive bowel sounds.  Soft, obese.  Mildly tender in the left  lower quadrant.  Nondistended.  No masses palpated.  No hepatosplenomegaly.  No guarding.  EXTREMITIES:  The patient has a healing ecchymotic lesion on the right calf.  It is mildly tender, but no active erythema.  Pedal pulses 2+ bilaterally.  She has a trace of pedal edema bilaterally.  She has good range of motion of  her legs and her arms.  NEUROLOGIC:  The patient is alert and oriented times three.  Cranial nerves  II-XII are intact.  Strength is intact throughout.  Sensation is intact.   LABORATORY DATA:  Admission laboratories:  EKG; normal sinus rhythm with an  occasional premature PVC and nonspecific ST-T wave abnormalities, heart rate  100 beats/minute.  Chest x-ray reveals scarring of the left lateral base and  the costophrenic angle.  Healing fractures on the left.  WBC 7.3, hemoglobin  13.6, hematocrit 39.5, MCV 92.5, and platelets 242,000.  Sodium 138,  potassium 3.9, chloride 106, CO2 25, glucose 111, BUN 14, creatinine 0.9,  and calcium 9.0.   ASSESSMENT:  1. Atrial fibrillation with rapid ventricular response.  The patient is symptomatic with shortness of breath and chest discomfort.  The atrial fibrillation and the patient's symptoms have resolved for now.  The patient will need to be admitted for further evaluation and management;  and, possibly reversible causes of the atrial fibrillation.  The patient  probably has a long history of paroxysmal atrial fibrillation given her  history.   1. Recent small subarachnoid hemorrhage secondary to a traumatic fall.  The patient has no neurological deficits.  She was reevaluated by  neurosurgeon, Dr. Yetta Barre, in the outpatient setting.  Per Dr. Yetta Barre a repeat  computerized tomographic scan of the head  revealed total resolution of the  hemorrhage.  Following hospital discharge on May 06, 2004 by the trauma  service the patient was admitted to rehabilitation.  The patient completed a  one-week stay at rehabilitation.   1. Left-sided rib fractures secondary to the fall.  The patient still has some discomfort secondary to the fractures.  Per the  chest x-ray the rib fractures are healing.   PLAN:  1. The patient will be admitted to a telemetry bed.  2. Cardiac enzymes will be ordered q.8 hours times three.  3. The patient's magnesium and phosphorus levels will be assessed.  4. The patient's thyroid function tests will also be assessed.  5. The patient will also be evaluated with a 2-D echocardiogram.  6. Will start Lovenox 1 mg/kg subcu q.12 hours.  I have discussed starting     anticoagulation with neurosurgeon, Dr. Yetta Barre, who believes that    anticoagulation is appropriate to start.  The patient had total     resolution of the subarachnoid hemorrhage approximately 6-7 weeks ago.  7. Will hold on Coumadin for now,  but will probably start it tomorrow.  8. Consult cardiology if needed.  9. Repeat EKG in the A.M.  10.      Will hold on starting rate controlling medication unless the     patient's heart rate increases  to a sustained rate of equal to or greater     than 120.  Will consider starting a calcium channel blocker or a beta     blocker.  The patient's heart rate is currently 69 beats/minute.                                                Elliot Cousin, M.D.    DF/MEDQ  D:  06/26/2004  T:  06/26/2004  Job:  [id number]   cc:   Juline Patch, M.D.  [address]  [place], [place] [address]  Fax: [phone]

## 2011-04-30 NOTE — Op Note (Signed)
NAMEMarland Chambers  JONIECE, SMOTHERMAN NO.:  [id number]   MEDICAL RECORD NO.:  [id number]          PATIENT TYPE:  AMB   LOCATION:  DAY                          FACILITY:  [facility]   PHYSICIAN:  Excell Seltzer. Annabell Howells, M.D.    DATE OF BIRTH:  December 03, 1921   DATE OF PROCEDURE:  09/15/2006  DATE OF DISCHARGE:                                 OPERATIVE REPORT   PROCEDURE:  Cystoscopy, bilateral retrograde pyelograms with interpretation  and biopsy and fulguration of a 2 x 3 cm lesion on the anterior bladder  wall.   PREOPERATIVE DIAGNOSIS:  Hematuria with bladder wall lesion.   POSTOPERATIVE DIAGNOSIS:  Hematuria with bladder wall lesion.   SURGEON:  Dr. Bjorn Pippin.   ANESTHESIA:  General.   SPECIMEN:  Bladder biopsies sent to path.   DRAIN:  18-French Foley catheter.   COMPLICATIONS:  None.   INDICATIONS:  [name] is an 77 year old white female who was sent for  evaluation of hematuria.  She was found to have a lesion on the anterior  wall of the bladder that was felt to require biopsy.  She has had a little  pain in the bladder area.   FINDINGS AND PROCEDURE:  The patient had been then taken off her Coumadin  and was on Lovenox bridging. She was taken to the operating room where she  was given Cipro, a general anesthetic was induced.  She was placed in  lithotomy position. Her perineum and genitalia were prepped with Betadine  solution, she was draped in the usual sterile fashion.  Cystoscopy was  performed using the 22 [place] scope and 12 and 70 degrees lenses.  Examination revealed a normal urethra.  The bladder wall was smooth and  pale. On the anterior bladder wall just proximal to the bladder neck was a 2  x 3 cm shaggy patch worrisome for carcinoma in situ versus inflammation, no  other lesions were noted.  The ureteral orifices were unremarkable.   The right ureteral orifice was cannulated with a 5-French open-end catheter  and contrast was instilled in a retrograde  fashion.   The right retrograde pyelogram demonstrated normal intrarenal collecting  system and ureter.   The left ureteral orifice was cannulated with a 5-French open-end catheter  and contrast was instilled in a retrograde fashion.  Renografin was used  because of her history of an allergy to Omnipaque and low pressure infusion  was also used. This revealed a normal intrarenal collecting system and  ureter.  No filling defects were identified other than some air bubbles.   After completion of the retrograde pyelogram, a cup biopsy forceps was used  to take 2 biopsies from the suspicious area on the anterior bladder wall.  A  Bugbee electrode was then used to fulgurate the entire lesion and the biopsy  sites.   The cystoscope was removed and an 18-French Foley catheter was placed. The  biopsies appeared to go to perivesical fat and I felt decompression of the  bladder was indicated for the next 24 hours. She will be kept off her  Lovenox for 24 hours as well.  She was taken down from lithotomy position,  her anesthetic was reversed, she was removed to the recovery room in stable  condition and there were no complications.      Excell Seltzer. Annabell Howells, M.D.  Electronically Signed     JJW/MEDQ  D:  09/15/2006  T:  09/17/2006  Job:  [id number]

## 2011-04-30 NOTE — Consult Note (Signed)
NAME:  [name], [name]                     ACCOUNT NO.:  [id number]   MEDICAL RECORD NO.:  [id number]                   PATIENT TYPE:  INP   LOCATION:  1825                                 FACILITY:  [facility]   PHYSICIAN:  Tia Alert, MD                  DATE OF BIRTH:  [birth year]   DATE OF CONSULTATION:  04/30/2004  DATE OF DISCHARGE:                                   CONSULTATION   CHIEF COMPLAINT:  Closed head injury.   HISTORY OF PRESENT ILLNESS:  [name] is an 77 year old white female  who reportedly fell in an oil spot while at [facility] and struck her head.  She states she did have a brief loss of consciousness, and she has some mild  amnesia for the event.  She was brought to the emergency department where  she had multiple injuries.  She was a Glasgow coma scale of 15 on arrival.  A head CT showed some small traumatic subarachnoid hemorrhage, and  neurosurgical consultation was requested.  She denies any headache,  diplopia, numbness, tingling, weakness, or other lateralizing signs.   PAST MEDICAL HISTORY:  1. Gastroesophageal reflux disease.  2. Hyperlipidemia.  3. Osteoarthritis.  4. Colonic polyps.  5. Diverticulosis.   MEDICATIONS:  1. Prilosec.  2. Vitamins.  3. Fish oil.  4. Baby aspirin.   ALLERGIES:  PENICILLIN.   SOCIAL HISTORY:  No tobacco or alcohol use.   PHYSICAL EXAMINATION:  VITAL SIGNS:  Pulse 70, blood pressure 157/70, oxygen  saturation 98%.  GENERAL:  Pleasant, elderly white female in no acute distress lying on a  stretcher.  HEENT:  She has a small laceration to the occipital region.  There is no  other obvious external trauma to the head.  Extraocular movements are intact  without nystagmus.  Her left pupil is irregular from cataract surgery.  Her  right pupils is small and reactive.  Gait is conjugate.  NECK:  Supple and nontender.  HEART:  Regular rate and rhythm.  EXTREMITIES:  No obvious deformity.  NEUROLOGIC:  She is  awake and alert and conversive.  There is apparent  aphasia.  She has good attention span.  Fund of knowledge and memory seem  appropriate.  No facial asymmetry.  Tongue protrudes in midline.  She can  puff out her cheeks.  She has good shoulder shrug.  Her strength is good in  the upper and lower extremities.  It seems to be equal, and is at least anti-  gravity to an in-bed exam.  I find no obvious weakness.  Sensation is  grossly intact throughout.  Gait is not tested.   IMAGING STUDIES:  CT scan of the head shows a very tiny hyperdensity in the  left frontal region, consistent with either traumatic subarachnoid  hemorrhage, or a small contusion.  There is no shift or mass effect.  The  basal cisterns are open.  There  is no epidural or subdural hematoma.  CT  scan of the belly shows a small left transverse process fracture of L4.  CT  scan of the neck is read as normal.   ASSESSMENT AND PLAN:  This is an 77 year old white female with a mild closed  head injury with a small amount of traumatic subarachnoid hemorrhage in the  left frontal region.  She will be admitted to the trauma service.  They can  perform q.4h. neurologic checks.  We will re-scan her if she has an  neurologic deterioration.  I do not see any need for seizure prophylaxis at  this time.  For the L4 transverse process fracture, this is a stable  fracture, and needs no specific treatment.  It does not need bracing.                                               Tia Alert, MD    DSJ/MEDQ  D:  04/30/2004  T:  05/02/2004  Job:  [id number]

## 2011-04-30 NOTE — H&P (Signed)
[facility]  Patient:    [name], [name]                  MRN: [id number] Adm. Date:  [id number] Attending:  Allean Found                         History and Physical  DATE OF BIRTH:  September 27, 1921.  PRIMARY CARE PHYSICIAN:  Dr. Amada Jupiter T. Dreiling.  DIAGNOSIS ON ADMISSION:  Pneumonia.  CHIEF COMPLAINT:  Chills and achiness.  HISTORY OF PRESENT ILLNESS:  [name] is a 77 year old married female who was in her usual good state of health until yesterday when she developed generalized achiness, fever and chills that lasted through today.  She occasionally goes to [facility] and sees Dr. Bennie Dallas, due to the proximity to her home -- her husband has Alzheimers and she is the main care-giver and sometimes cannot get into [place].  Her regular physician, otherwise, is Dr. Amada Jupiter Dreiling at [facility]. Patient was seen at [facility] this morning due to the fact of the chills, achiness and just not feeling well.  She was somewhat lethargic there and from best I can gather, she was transferred by ambulance to [facility] ER due to this.  At the present time, she is quite alert and attentive. She has complaints of mild shortness of breath, light cough and myalgias.  PAST MEDICAL HISTORY:  Her past medical history includes medical problems such as GERD, hyperlipidemia, anxiousness, osteoarthritis, hematuria with a negative workup, colon polyps, diverticulosis, history of chest pain with a negative workup in 1999.  CURRENT MEDICATIONS 1. Protonix 40 mg a day. 2. Ativan 1 mg one-half to one tablet b.i.d. 3. Occasional use of Naprosyn b.i.d. 4. Aspirin one a day. 5. She does take vitamins.  There is a question whether she is on Premarin and this will need to be rectified.  PAST SURGICAL HISTORY:  She has had a hysterectomy but ovaries are intact. She has had a tonsillectomy.   She has had a fibroma/lipoma removed off her back.  She has had bilateral cataract removal with lens implants and a bladder tacking.  ALLERGIES:  She is allergic to PENICILLIN; this was after an injectable source.  HEALTH MAINTENANCE:  She had a complete physical on July 19, 2000 by Dr. Lattie Corns.  She had negative Hemoccult cards x 6 on August 05, 2000.  There is some question about her last vaccine status, but it appears that she had a Pneumovax approximately 1997.  Her adult tetanus was done in 1999.  She had a Pap smear on May 26, 1998 which was normal.  Her last mammogram report that I can find is June 30, 1998, which was normal, although she states she had one in August of 2000.  FAMILY HISTORY:  Mother with uterine cancer at a young age and died.  Father died secondary to old age.  She has 10 brothers and sister which have a positive history throughout them of leukemia, throat cancer, uterine cancer, atherosclerotic heart disease; no CVA, no hypertension, no other cancers.  SOCIAL HISTORY:  Married times years.  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  She does not smoke or drink alcohol.  She has three children, five grandchildren and two great-grandchildren.  Taking care of her husband takes up most of her time.  REVIEW OF  SYSTEMS:  She has myalgias, cough, chills and feeling of sluggishness.  No chest pain.  No dyspnea.  No abdominal pain.  She does have some mild joint and back pain.  Denies any trouble with her memory.  PHYSICAL EXAMINATION  GENERAL:  She is an alert, oriented white female in no acute distress.  VITAL SIGNS:  Temperature 99.4, pulse 88, respirations 24, blood pressure 91/45.  HEENT:  Her TMs reveal irritated canals but her TMs are normal.  Throat clear without erythema.  Pupils are equal, round and reactive to light.  Extraocular muscles intact bilaterally.  Fundi show some narrowing of the vessels but  no hemorrhages.  Cranial nerves II-XI are intact.  NECK:  Positive lymph node in the right anterior chain.  Neck is supple; otherwise, no other masses felt and no thyromegaly.  LUNGS:  There are some diffuse rales in the mid-lung fields bilaterally, left greater than right; however, there are no rhonchi.  She has good air movement.  BACK:  She has no pain with palpation of back.  HEART:  Regular rate and rhythm with no rubs, murmurs or gallops.  ABDOMEN:  Positive bowel sounds throughout.  No hepatosplenomegaly.  No masses.  No tenderness.  PELVIC:  Deferred.  RECTAL:  She had normal Hemoccults in August of 2001.  EXTREMITIES:  No edema.  No cyanosis.  She has good reflexes.  Her skin is cool.  No clamminess.  NEUROLOGIC:  Her sensory is intact.  Strength is 5/5.  Mentation is good.  LABORATORY AND X-RAY FINDINGS:  Urinalysis shows hematuria, no leukocytes. Culture is pending.  Her white blood cell count is 23.8, hemoglobin is 11.0 and her platelet count is 205,000.  She had a left shift with 86 neutrophils, 8 lymphocytes, 5 monos, with increased bands of greater than 20%.  Chemistry panel shows a sodium of 131, potassium of 3.1 and the rest of the lab work is normal.  Chest x-ray shows a left perihilar density -- probable pneumonia; this will need to be followed to make sure it is not a neoplasm.  KUB otherwise is normal, showing some diverticulosis.  ASSESSMENT AND PLAN 1. Presumed pneumonia, with increased white cell count, left shift, increased    bands and perihilar density.  Patient is allergic to penicillin and    emergency room physician started patient on Tequin 400 mg intravenously    q.d. and I will continue.  She has had Tylenol for fever; we will continue    this.  Blood cultures and urine cultures are pending.  Will obtain a sputum    Gram stain, if able. 2. Hematuria:  She has had previous urologic workup per her primary care    physician and urologist,  which stated it was negative in the office chart.    Will await urine culture.  3. History of colon polyps:  She is due for a colonoscopy with    Dr. Hedwig Morton. Juanda Chance and this will need to be set up.  She does have    diverticulosis.  No problem with pain of the abdomen at the present time. 4. History of gastroesophageal reflux disease:  She is on Protonix 40 mg a day    and will continue that. 5. Hypokalemia:  We will replace this orally.  She will be given regular meals    along with a banana in the emergency room tonight.  She is on no    medications to cause potassium wasting.  Consider this due  to nutrition,    but she has normal sodium level. 6. Anxiety and stress:  Patient is the main care-giver for her husband and    will attempt to be discharged as soon as possible.  Will continue her    Ativan 0.5 mg p.o. b.i.d. as needed. 7. Arthritis:  Will treat if needed.  Add further pain medications as needed. 8. Will continue to follow this patient and she should have a quick turn    around. DD:  09/28/00 TD:  09/29/00 Job: [id number]

## 2011-04-30 NOTE — H&P (Signed)
NAME:  [name], [name]                     ACCOUNT NO.:  [id number]   MEDICAL RECORD NO.:  [id number]                   PATIENT TYPE:  INP   LOCATION:  1825                                 FACILITY:  [facility]   PHYSICIAN:  Tia Alert, MD                  DATE OF BIRTH:  1921/07/31   DATE OF ADMISSION:  04/30/2004  DATE OF DISCHARGE:                                HISTORY & PHYSICAL   HISTORY OF ILLNESS:  This is an 77 year old white female who is a patient of  Dr. 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  She apparently had a loss of consciousness at  the scene but by the time she presented to the emergency room, she was alert  and responding.  She was not a non trauma code.   According to her family, she has had one or two falls before.  The patient  really does not remember the circumstances of this fall, but she has had no  prior seizure, no prior loss of consciousness, no significant coronary  artery disease she is aware of.   PAST MEDICAL HISTORY:  She is allergic to penicillin, which makes her break  out.  She also says she is allergic to IVP dye though it is somewhat unclear  what that causes.   CURRENT MEDICATIONS:  Prilosec 40 mg daily.  She takes vitamin C, vitamin E,  fish oil, baby aspirin a day.  She is on a  allergy medicine unknown, and  her garlic pill.   REVIEW OF SYSTEMS:  PULMONARY:  She has been treated for bronchitis and not  really sure what the medicine are that she is taking.  CARDIOVASCULAR:  No  evidence of chest pain or hypertension.  GI:  She has some reflux but no  history of peptic ulcer disease or liver disease.  Her prior abdominal  surgery includes a hysterectomy in the past.  UROLOGIC:  She has had a  bladder tacking.   Her family,  __________ son in the room, one daughter is Andree Elk.   PHYSICAL EXAMINATION:  VITAL SIGNS:  Her pulse is 70, blood pressure 157/70,  respirations are 20 and regular.  O2 sat is 98%.  Her skin is warm.  HEENT:  She has a hematoma with about a 1-2 cm LAC of her left posterior  scalp.  She has no obvious facial trauma or fractures.  Her extraocular  movements are good x 6 with reactive pupils.  Her external canals are  unremarkable.  Her neck has no tenderness.  She moves it without any pain.  LUNGS:  Clear to auscultation.  Slightly decreased breath sounds, but she  hurts when she takes a deep breath.  HEART:  Regular rate and rhythm.  I hear no murmur or rub.  BACK:  She is tender when she  sits up, particularly in her low back, maybe  over L2/L3.  EXTREMITIES:  She came with a splint on her right leg but has no real bony  deformity.  What she has looks like she has a contusion along her right  posterior leg, probably where she fell or slid over a side or something, but  I do not see an obvious fracture or bony injury or deformity.  NEUROLOGIC:  She is oriented to her person, place and time.  She has gross  sensation intact in her upper and lower extremities.   LABS:  Sodium of 139, potassium of 3.9, chloride of 105, potassium of 3.9,  chloride of 105, CO2 of 26, glucose of 142, BUN of 11.  Hematocrit 35, white  blood cell count of 13,300.  Review of her chest x-ray shows rib fracture of  6, 7 and 8.  No obvious pneumothorax.  Her pelvic films were unremarkable.  CT of her head showed a left frontal contusion without bony injury.   Her C spine of her neck show some degenerative disease but no obvious  injury.  Her lungs showed no pneumothorax.  Again, it shows the left rib  fractures.  Her abdomen shows a left renal cyst, but no solid organ injury  or free fluid.  X-ray of her right leg shows no obvious fracture.   IMPRESSION:  1. Left sternal contusion by CT scan with a stable neurologic exam.  Dr.     Yetta Barre has been consulted to see her in consultation.  2. Left posterior scalp laceration, closed in the emergency  room, 2 cm     laceration.  3. Left rib fractures of 6, 7 and 8.  4. Left renal cyst.  5. Diverticulosis.  6. Right posterior leg hematoma and contusion.  7. Gastroesophageal reflux disease.  8. History of bronchitis.  9. Questionable prior history of falls, which may need further evaluation     when she gets over all of this.   Again, I explained this all to the patient and her family at her bedside.      Sandria Bales. Ezzard Standing, M.D.                     Tia Alert, MD    DHN/MEDQ  D:  04/30/2004  T:  04/30/2004  Job:  [id number]   cc:   Tia Alert, MD  [address]  [place], [place] [address]  Fax: [phone]   Juline Patch, M.D.  [address]  [place], [place] [address]  Fax: [phone]

## 2011-04-30 NOTE — Discharge Summary (Signed)
NAME:  [name], [name]                     ACCOUNT NO.:  [id number]   MEDICAL RECORD NO.:  [id number]                   PATIENT TYPE:  IPS   LOCATION:  4010                                 FACILITY:  [facility]   PHYSICIAN:  Ranelle Oyster, M.D.             DATE OF BIRTH:  March 18, 1921   DATE OF ADMISSION:  05/06/2004  DATE OF DISCHARGE:  05/15/2004                                 DISCHARGE SUMMARY   DISCHARGE DIAGNOSES:  1. Congestive heart failure/small subarachnoid hemorrhage.  2. Rib fractures left 6, 7, 8 and 9.  3. Scalp laceration, status post closure.  4. Left knee strain.  5. History of anemia.  6. Left leg blister.   HISTORY OF PRESENT ILLNESS:  The patient is an 77 year old white female with  past history of osteoarthritis who was at [facility] on Apr 30, 2004, and  fell in the oil spot, struck her head, landing in the oil well, possible  loss of consciousness with mild amnesia of the event.  Head CT revealed  small traumatic subarachnoid hemorrhage.  Conservative care by neurosurgeon,  Tia Alert, MD.  The patient also sustained a right leg contusion and  scalp laceration with closure and scalp hematoma, left rib fractures 6, 7  and 8.  PT report at this time indicates the patient minimal to guard assist  ambulating 40 feet using rolling walker, minimal assist transfers and bed  mobility not tested.  Hospital course significant for anemia.  Dopplers were  performed on May 04, 2004, they were negative.  Positive for knee strain and  cough.   PAST MEDICAL HISTORY:  1. Recent bronchitis.  2. GERD.  3. Hyperlipidemia.  4. Migraines.  5. Diverticulosis.  6. Osteoarthritis.   PAST SURGICAL HISTORY:  None.   PRIMARY CARE PHYSICIAN:  Juline Patch, M.D.   MEDICATIONS PRIOR TO ADMISSION:  1. __________ fish oil.  2. Garlic pill.  3. Prilosec.   REVIEW OF SYMPTOMS:  Significant for headache and incontinence.   SOCIAL HISTORY:  The patient lives with daughter in  one level home and four  to three steps at entry.  Independent prior to admission.  No tobacco or  alcohol use.  Limited family support.   HOSPITAL COURSE:  [name] was admitted to [facility]  Department on May 06, 2004, for comprehensive inpatient rehabilitation where  she received more than three hours of therapy daily.  Overall, Mrs.  [name] progressed very well once her pain was under control.  She had  significant rib pain and muscle achiness and soreness.  Initially, the  patient was placed on oxycodone and received ice pack and Lidoderm patch.  Scalp laceration healed very well and staples were to be removed.  The  patient received Guaifenesin as needed for cough.  The patient occasionally  complained of neck pain.  She received sports cream b.i.d. in the neck and  heating pad to neck as needed.  The patient also to receive Ultram 500 mg  p.o. q.6h. as well.  Oxycodone initially discontinued on May 07, 2004, and  Percocet was started which seemed pain was better controlled.  She continued  to have the Lidoderm patches as needed for a period of 12 hours, one to two  patches daily.  As stated above, once the patient had the pain under  control, she was able to progress very well.   As noted, on May 08, 2004, while performing a transfer with therapist, the  patient noticed a large pop in her back.  She sustained excruciating pain.  The patient was unable to do therapies throughout the rest of the day.  She  had a repeat chest x-ray which revealed that the patient had another  fracture at left L9.  The patient continued the same pain management care.  Overall, she was discharged at supervision level.  Once the pain was under  control, she was able to ambulate greater than 30 feet.  The patient also  was started on ibuprofen 800 mg b.i.d. to  help with her rib pain.  The  patient was started on Trinsicon one tablet b.i.d. for an anemia while in  rehab.  There  were no other major issues while the patient was in rehab.   At the time of discharge, PT report indicates the patient is able to  ambulate approximately 150 feet supervision level with rolling walker,  transfer sit to stand supervision level, bed mobility modified  independently.  She would be able to perform most ADLs with supervision to  modified independence.  From a memory standpoint with her SDH, long-term  memory was within functional limits.  Attention, responsiveness, orientation  within functional limits.  The patient had significant speech and language  deficits.  Cognition is within functional limits.  The patient was  discharged at overall supervision level.   LABORATORY DATA:  Latest hemoglobin 9.5, hematocrit 27.5, white blood cell  count 27.4, platelet count 288. Sodium 137, potassium 3.9, chloride 105, CO2  27, glucose 126, BUN 13, creatinine 0.8.  AST 22, ALT 23.   At the time of discharge physical examination indicates the patient did have  a blister on left leg was present with Op-Site.  This was intact.  Still  mild ecchymosis on extremities.  Vital signs stable.  She was discharged  home at supervision with her family.   DISCHARGE MEDICATIONS:  1. Trinsicon one tablet  8 a.m. and 8 p.m.  2. Os-Cal 5 mg twice daily.  3. Ibuprofen 400 mg three times daily.  4. Ultram 50 mg q.6-8h.  5. Lidocaine patch on taper.  6. Percocet one or two tablets q.4-6h. p.r.n. pain.  7. Robitussin as needed.   Encourage incentive spirometry.   PAIN MANAGEMENT:  Tylenol, Percocet, Ultram, and ibuprofen.   No driving, no drinking alcohol.  No smoking.  Use walker.  [facility]  in [place], [place], on May 19, 2004, at 2:15.  Follow-up Dr. Ranelle Oyster on June 24, 2004, at 1 p.m.  Marikay Alar,  M.D. in two to three weeks, call for appointment.  Dr. Ricki Miller in four to six  weeks to check hemoglobin and anemia.     Drucilla Schmidt, P.A.                          Ranelle Oyster, M.D.    LB/MEDQ  D:  05/19/2004  T:  05/20/2004  Job:  [id number]

## 2011-04-30 NOTE — Op Note (Signed)
NAME:  Adriana, Chambers                     ACCOUNT NO.:  [id number]   MEDICAL RECORD NO.:  [id number]                   PATIENT TYPE:  INP   LOCATION:  2034                                 FACILITY:  [facility]   PHYSICIAN:  Hettie Holstein, D.O.                 DATE OF BIRTH:  Apr 27, 1921   DATE OF PROCEDURE:  DATE OF DISCHARGE:  06/29/2004                                 OPERATIVE REPORT   ADMISSION DIAGNOSIS:  Shortness of breath with transient chest pain and  atrial fibrillation with rapid ventricular rate.   DISCHARGE DIAGNOSES:  1. Paroxysmal atrial fibrillation with rapid ventricular rate, controlled     with initiation of Cardizem and initiation of Coumadin therapy.  2. Recent subarachnoid hemorrhage secondary to trauma.  3. Gastroesophageal reflux disease.  4. Anemia resolved.   MEDICATIONS ON DISCHARGE:  1. Cardizem CD 240 mg q.d.  2. Prilosec 20 mg q.d.  3. Nu-Iron 150 q.d.  4. She is instructed to resume all other medications as outlined in the     history and physical as dictated by Dr. Elliot Cousin.  5. She is to initiate Senokot twice daily, hold for diarrhea.  6. Coumadin 7.5 mg each evening for five days until reassessment of PT INR.  7. Lovenox 100 mg subcu one day starting on Tuesday, June 30, 2004.  She was     provided with a Coumadin booklet and instructions.  She had scheduled to     follow with Arlys John _________ at 1:45 p.m. for PT INR and Dr. Ricki Miller in one     to two weeks.  She was instructed to call for appointment.   HISTORY OF PRESENT ILLNESS:  For details see H&P as dictated by Elliot Cousin, M.D.; however, briefly, this is an 77 year old female with past  medical history significant for traumatic fall in year 2005 causing closed  head injury and small frontal subarachnoid hemorrhage, chronic anemia,  gastroesophageal reflux disease and degenerative joint disease who presented  to the emergency department with a chief complaint of rapid heart beat and  shortness of breath and transient chest pain.  She stated that years ago she  had episodes of heart fluttering in the past.  That did not happen for  several years until day preceding.  At approximately  4 p.m. on July 14 the patient was sitting down at home and began to feel her  heart rate.  She felt that her heart was racing so fast, it was making her  whole body shake.  She has associated some sternal chest discomfort and  associated left arm numbness.  She called her grandson, who then called the  EMS.  The EMS arrived and found her to be in atrial fibrillation with rapid  rate of 140.   HOSPITAL COURSE:  She was admitted for further evaluation and management.  She was initiated on rate controlling medications and anticoagulation.  She  underwent further evaluation with a V/Q scan for evaluation of  pulmonary embolus which revealed normal study, not suggestive of pulmonary  emboli.  Her course was otherwise uneventful.  She underwent 2D echo study  which revealed normal LV size and left ventricular function of 55 to 65%.  She was arranged for outpatient Lovenox bridge and was discharged home in  stable condition.                                               Hettie Holstein, D.O.    ESS/MEDQ  D:  07/22/2004  T:  07/22/2004  Job:  [id number]   cc:   Juline Patch, M.D.  [address]  [place], [place] [address]  Fax: [phone]

## 2011-04-30 NOTE — Discharge Summary (Signed)
[facility]  Patient:    [name], [name]                  MRN: [id number] Adm. Date:  [id number] Disc. Date: 10/03/00 Attending:  Jetty Duhamel T CC:         Willis Modena. Dreiling, M.D. fax# [phone]   Discharge Summary  PRIMARY CARE PHYSICIAN:  Dr. Amada Jupiter Dreiling.  DATE OF BIRTH:  [birth year]  DISCHARGE DIAGNOSES:  1. Left hilar pneumonia.  2. Possible left hilar mass though most consistent with problem #1. i  3. Gastroesophageal reflux disease.  4. Hyperlipidemia.  5. Anxiousness.  6. Osteoarthritis.  7. Chronic hematuria with previous negative workup.  8. History of colon polyps.  9. History of diverticulosis. 10. History of chest pain with complete and negative workup 1999. 11. Status post hysterectomy with intact ovaries. 12. Status post tonsillectomy. 13. Status post lipoma resection from back. 14. Status post bilateral cataract removal with lens implants. 15. Status post bladder tacking. 16. Allergy to PENICILLIN.  DISCHARGE MEDICATIONS:  1. Protonix 40 mg 1 q.d.  2. Ativan 1 mg 1/2 tablet b.i.d. p.r.n.  3. Aspirin 325 mg 1 a day.  4. Zithromax 250 mg 1 a day for 3 days.  FOLLOW-UP:  The patient will return to her primary care physician, Dr. Norva Pavlov, for reevaluation on October 11, 2000 at 11:00 in the morning. At that time, routine follow-up for community acquired pneumonia will be accomplished and focus will be placed on pulmonary exam.  Additionally, at that time it is advised that the patients herpes labialis be reevaluated to assure that the patient is tolerating placement of her dentures and that there is no further progression of this disease.  Additionally, it is recommended that in 4-6 weeks, the patient undergo routine repeat chest x-ray to assure that there has been resolution of the hilar infiltrate which raised the question of a possible hilar mass per radiology review.  PROCEDURES:  Acute abdomen with  chest film on September 28, 2000--frontal view of the chest demonstrates ill defined air space opacity in the left hilar region. The radiologist suspected that this represented a small region of pneumonia because there appeared to be an air bronchogram although it was suggested that reimaging be accomplished following full treatment in order to exclude a hilar mass.  ADMISSION HISTORY:  This revealed a 77 year old Caucasian female who was in her usual state of good health until the day prior to admission when she developed generalized achiness, fever and chills that lasted throughout the day. At the time of admission, the patient was somewhat lethargic but alert and attentive. She had complaints of mild shortness of breath and light coughing myalgias.  HOSPITAL COURSE BY PROBLEM: #1 - COMMUNITY ACQUIRED PNEUMONIA.  The patient was admitted to the hospital and treatment was initiated with Tequin for a presumed community acquired pneumonia--on IV Tequin the patient progressed nicely and malaise and pulmonary symptoms resolved over the next 48 hours. Please make note of the chest x-ray findings as listed above. Clinically, it is suspected that this was in fact a pneumonia as this also fit the admission physical exam. The patient did respond well to appropriate community acquired pneumonia treatment and had returned to her baseline health at the time of discharge. Review of systems was negative for anything to suggest a systemic cancer. Nevertheless, it is recommended that a routine follow-up chest x-ray be accomplished in 4-6 weeks status post discharge. The patient will be  discharged on the azithromycin as this change was made during her hospitalization. It was suspected initially that the patient had undergone an allergic reaction to Tequin though further investigation suggested that this was in fact a case of herpes labialis. #2 - QUESTIONABLE ALLERGIC RESPONSE TO TEQUIN. By her second  day of hospitalization, the patient developed a left sided swelling of the upper and lower lip. She denied shortness of breath or stridor but she did report some pain in the floor of the mouth as well. By her third day of hospitalization, this had progressed to the point that the patient was unable to tolerate her dentures. As this was initially thought to possibly represent an allergic reaction to Tequin, she was changed to azithromycin. She tolerated the azithromycin without difficulty. She was initially placed on prednisone and Claritin in an attempt to quail what thought to be an allergic response. Further review of systems and history suggested that this could possibly be herpes labialis. By the day of discharge, lesions were beginning to develop on the lower lip which were more consistent with herpes labialis. As a result, prednisone was discontinued and the patient was instructed on routine care and that time simply would be required to resolve the lesions. #3 - HERPES LABIALIS.  Please see detailed dictation as above. At the time of discharge, the patient was instructed on symptomatic care of herpes labialis and instructed to call her physicians office should her lesions progress over the next week prior to her follow-up appointment. #4 - HYPOKALEMIA.  During hospitalization, the patient had intermittent difficulty with hypokalemia secondary to decreased p.o. intake. This was repleted with oral potassium and remained in a normal range when the patients diet had resumed. No further workup was felt to be necessary concerning the hypokalemia.  DISCHARGE LABS:  Hemoglobin 11.1, platelets 278 and white count of 10. DD:  10/03/00 TD:  10/03/00 Job: [id number]

## 2011-04-30 NOTE — Consult Note (Signed)
NAME:  ARIANN, KHAIMOV                     ACCOUNT NO.:  [id number]   MEDICAL RECORD NO.:  [id number]                   PATIENT TYPE:  INP   LOCATION:  2034                                 FACILITY:  [facility]   PHYSICIAN:  Jaclyn Prime. Lucas Mallow, M.D.                DATE OF BIRTH:  1921-09-27   DATE OF CONSULTATION:  DATE OF DISCHARGE:                                   CONSULTATION   I appreciate the opportunity to participate in the care of the very nice, 77-  year-old [name], patient of Dr. Robb Matar and Dr. Juline Patch, by providing consultative services, at Dr. Donato Heinz request, in  regards to [name]'s atrial fibrillation.   Her current history superbly detailed by Dr. Elliot Cousin in the history  and physical, which is found in the chart.  In summary, she felt her heart  start to race while sitting down at home.  There was some associated chest  discomfort and shortness of breath, and she was brought to the emergency  room by her grandson.  Atrial fibrillation with a rapid rate was  demonstrated on the EKG.  Subsequently, she has had recovery of sinus  rhythm.   She says that she has had occasional skipped heart beats for years, but  except for a single episode, much shorter, of rapid irregular heart rate,  occurring approximately one week before this admission, she had had no  similar episode in the past to the one which caused her admission.   Her recent past medical history is very significant.  She was hospitalized  for a month in May, after a closed-head injury, with severe enough bleeding  to get her hemoglobin down to about 6.  She also had a small, frontal  subarachnoid hemorrhage, which has since resolved, but she continues to have  some headaches related to that.  That fall was actually caused by falling  into a grease pit while she was trying to get into her truck to retrieve  something when it was in the shop.  She did not faint then, and  has not  fainted at any other time, nor has she had any other fall that she is aware  of.  Her hemoglobin has been checked since that episode and has been normal.  She has also had gastroesophageal reflux disease; pneumonia in 2001;  hematuria; diverticulosis and diverticulitis, which bothers her sometimes,  and she does sometimes fail to adhere to the no seeds diet; a prior  abdominal hysterectomy; and bilateral cataract surgery. There is also an  operative report in the records received from her previous physician, which  appears to make reference to a prior cholecystectomy, but she denies every  having had a cholecystectomy, and her daughter seconds that.   Finally, it is of note that she had a cardiac catheterization in 1987 by Dr.  Donnie Aho. At that time, she was having atypical chest  discomfort, and had a  failed stress test and had a normal cardiac catheterization.   FAMILY HISTORY:  Her mother died of cervical cancer in her 24s.  Her father  died at 64 of old age.  A daughter, now 61, had seizures. A sister, now 5,  has angina, and is going to have a test this week.  She had two brothers  who had heart trouble, otherwise unspecified.  One of them had died of  cancer.   SOCIAL HISTORY:  She is widowed.  She lives in [place] with her  youngest daughter.  She smoked cigarettes up until about 30 years ago.  She  walks with a cane.  She no longer drives in [place].   REVIEW OF SYSTEMS:  CONSTITUTIONAL:  She denies fevers, chills or sweats.  She also denies claudication.  She walks with a cane.  She has had  persistent swelling of her right leg after very deep bruise encountered in  the same fall.  Also in that fall, she broke several ribs.  Eyes:  No  diplopia or blurring.  She does wear glasses.  ENT:  She is rather deaf, and  she has full dentures in both jaws.  CARDIOVASCULAR:  See the history of  present illness.  RESPIRATORY:  She has had fairly frequent problem  with  bronchitis.  She says she had bronchitis with a terrible cough when she was  in the hospital five weeks ago.  When she develops bronchitis, she has quite  noticeable wheezing.  Otherwise, she is not limited by dyspnea on exertion.  GI:  Her diverticulitis and hiatal hernia are noted above.  GU:  No dysuria  or pyuria.  She does have a history of microscopic hematuria, and she says  that she now has nocturia x2.  MUSCULOSKELETAL:  She continues to have pain  in her left ribs in particular.  She also has pain in her upper back at  about the T2 or T3 level, and this has bothered her since the fall in May.  NEUROLOGIC:  As noted above, she has never fainted.  She does have headaches  following the head trauma.  PSYCHIATRIC:  No depression or hallucinations.  ENDOCRINE:  No known diabetes or thyroid disease.  She has had a normal TSH  while in the hospital.  HEME/LYMPH:  No swelling in the neck, axillary or  groin.   ALLERGIC:  She is allergic to PENICILLIN.   PHYSICAL EXAMINATION:  VITAL SIGNS:  Blood pressure 130/70, pulse 70 and  somewhat irregular, respirations 18 and unlabored.  She is afebrile.  GENERAL:  She is a thin, otherwise well-developed, well-nourished, elderly  woman, in no acute distress.  She is oriented to person, place and time.  Her mood and affect are pleasant.  Conjunctivae and lids reveals no  xanthelasma, icterus or arcus senilis.  She has full dentures in both jaws.  The oral mucosa reveals no pallor or cyanosis.  NECK:  Supple and symmetrical.  Trachea is midline and mobile.  There is no  palpable thyromegaly or cervical node.  No carotid bruits or JVD.  LUNGS:  Respiratory effort is slightly splinted with persistent pain in her  ribs.  Her lungs are otherwise clear to auscultation and percussion except  for mild prolongation of the forced expiratory time.  There is no definite  wheezing today. BACK:  Her back is straight.  There is very slight tenderness over  the upper  spine.  There  is no redness or heat.  Her gait is not tested.  She could not  undergo a stress test of any significance.  Her muscle strength and tone are  at the low-limit of normal for her age.  HEART:  The cardiac apical impulse is cryptic.  The heart rhythm is regular  with occasional premature beats.  There is no gallop, click or murmur.  There is no palpable precordial impulse.  EXTREMITIES:  Her digits and nails reveal no clubbing, cyanosis or splinter  hemorrhage.  There is trace to 1+ edema in her right leg.  Pedal pulses are  intact, 2+ PT and dorsalis pedis pulses bilaterally.  SKIN:  Skin and subcutaneous tissue reveal no rash, but her right leg is in  a compression hose, and I cannot examine the skin there.  ABDOMEN:  Flat, nontender.  There is still tenderness over the left lower  ribs.  There is no palpable enlargement of the liver or spleen.  The  abdominal aorta is palpable, and there is no bruit.  The femoral arteries  are without bruits.   An EKG was done today and read by Dr. Fraser Din as essentially normal.  The  EKG on admission to the hospital showed atrial fibrillation with a rapid  rate.  An EKG dated yesterday showed sinus rhythm, a vertical QRS axis of 0  degrees, and nonspecific ST-T wave abnormalities. The heart rate was still  rapid on that EKG, 100 beats per minute.   There are several issues in treating atrial fibrillation, and [name]  exemplifies most of them.   First of all, the AFFIRM trial showed that there is no identifiable benefit  to really aggressive attempts to maintain sinus rhythm in persons who are  essentially asymptomatic with rate control.  Since [name] has not  had adequate rate control up until now, we can probably assume that she will  have very few symptoms with adequate rate control.  If we had to consider  rhythm control, then we would have to consider antiarrhythmic drugs.  I have  reviewed, Up-To-Date  today, and the discussion there, to which you might  wish to refer, is really very helpful.  There is a developing consensus that  non-emergent DC cardioversion in people over 65 is probably not an effective  strategy.  In addition, there is a very strong statement concerning the use  of anticoagulant drugs in the elderly, with the remarkable estimate that a  person would have to fall 300 times in a year in order to have the risk of  falling exceed the risk of stroke in the absence of Coumadin.  I frankly can  hardly believe that, but I would be willing to agree to 10 or 15 as an  acceptable number. Certainly, [name] does not have a history of  spontaneous falling that should cause [place] any concern about giving her any  anticoagulants.   As far as the choice of rate-controlling drugs is concerned, there really  are only three classes, namely digitalis, beta-blockers, and calcium channel blockers.  Digitalis is not very effective, certainly, in terms of  preventing reversion of atrial fibrillation from sinus rhythm.  She has a  relative contraindication to beta-blockers, in that she does have frequent  episodes of bronchitis with wheezing.  She has no counterindication to  calcium channel blockers and fortunately has normal left ventricular  function, according to echocardiogram.   I recommend that you consider placing her on diltiazem  sustained-release  preparation, probably at least 180 mg daily.  I would also place her on  Coumadin in the therapeutic doses.   As far as followup is concerned, we will place her in our Coumadin followup  clinic at [facility].  Dr. Juline Patch, her primary  physician, will continue to have primary responsibility for her followup.  At this point, I will plan to see her in the office within a week or two  after discharge, and make additional arrangements, and plan to see her on an  occasional basis to adjust her medications.   I  appreciate the opportunity to participate in the care of this delightful  woman.  As it turns out, her daughter works in our office, so we will have  an easy time of maintaining communication with her.                                               Jaclyn Prime. Lucas Mallow, M.D.    DDG/MEDQ  D:  06/26/2004  T:  06/26/2004  Job:  [id number]   cc:   Miachel Roux L. Robb Matar, M.D.   Elliot Cousin, M.D.   Juline Patch, M.D.  [address]  [place], [place] [address]  Fax: [phone]

## 2011-04-30 NOTE — Discharge Summary (Signed)
NAME:  [name], [name]                     ACCOUNT NO.:  [id number]   MEDICAL RECORD NO.:  [id number]                   PATIENT TYPE:  INP   LOCATION:  3011                                 FACILITY:  [facility]   PHYSICIAN:  Phineas Semen, P.A.                DATE OF BIRTH:  06-22-1921   DATE OF ADMISSION:  04/30/2004  DATE OF DISCHARGE:  05/06/2004                                 DISCHARGE SUMMARY   FINAL DIAGNOSES:  1. Fall.  2. Closed head injury with brief loss of consciousness.  3. Small frontal intracerebral hemorrhage.  4. Left rib fractures.  5. Contusion, right lower extremity.  6. Scalp hematoma.   HISTORY:  This is an 77 year old white female who is Dr. [name].  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  Apparently had loss of consciousness at  the scene, but by the time she presented to the emergency room she was alert  and responding.  She was seen in the emergency room by Dr. Ovidio Kin.  She had a workup performed.  CT scan showed a left frontal contusion.  Because of this Dr. Marikay Alar was consulted.  He saw the patient and noted  that she needed to be observed, and she was subsequently hospitalized.   HOSPITAL COURSE:  The patient's hospital course was without untoward events.  She did well initially.  Her diet was advanced as tolerated.  PT and OT  consults were done on the first hospital day.  They came to see the patient.  Dr. Lynne [name] office was notified that she was in the hospital at this time.  She was complaining of some left knee pain.  There was tenderness to  palpation noted on May 02, 2004, but left knee x-rays were done, which ruled  that there were no fractures.  She continued to progress in a satisfactory  manner.  Rehab consult was done.  She was a rehab acceptable patient.  They  recommended a rehab stay and subsequently she was transferred to rehab on  May 06, 2004.  She  did well during this time.  She was tolerating a diet.  She was having no complaints or problems noted.  She was subsequently  discharged in a satisfactory and stable condition.                                                Phineas Semen, P.A.    CL/MEDQ  D:  06/04/2004  T:  06/05/2004  Job:  [id number]

## 2011-04-30 NOTE — Op Note (Signed)
NAME:  [name], [name]                     ACCOUNT NO.:  [id number]   MEDICAL RECORD NO.:  [id number]                   PATIENT TYPE:  EMS   LOCATION:  MAJO                                 FACILITY:  [facility]   PHYSICIAN:  Sandria Bales. Ezzard Standing, M.D.               DATE OF BIRTH:  14-Sep-1921   DATE OF PROCEDURE:  04/30/2004  DATE OF DISCHARGE:                                 OPERATIVE REPORT   PREOPERATIVE DIAGNOSIS:  A 2 cm left posterior scalp laceration.   POSTOPERATIVE DIAGNOSIS:  A 2 cm left posterior scalp laceration.   OPERATION PERFORMED:  Closure of scalp laceration.   SURGEON:  Sandria Bales. Ezzard Standing, M.D.   ANESTHESIA:  10 mL of 2% Xylocaine plain.   INDICATIONS FOR PROCEDURE:  [name] is an 77 year old white female who  fell into a [employer] ditch, was knocked unconscious, is being admitted to  the trauma service, with a left posterior scalp laceration where she struck  something.   DESCRIPTION OF PROCEDURE:  Patient in the ER rooms where the scalp  laceration was cleaned with saline and Betadine and skin infiltrated with 2%  Xylocaine.  I evacuated any hematoma.  I felt no foreign body, felt no  fracture underneath the scalp laceration and then closed it with skin  staples.  The patient tolerated the procedure well.  Will be admitted to the  trauma service for further observation.                                               Sandria Bales. Ezzard Standing, M.D.    DHN/MEDQ  D:  04/30/2004  T:  05/02/2004  Job:  [id number]

## 2011-09-08 LAB — POCT I-STAT, CHEM 8
BUN: 23
Creatinine, Ser: 1
Sodium: 137
TCO2: 26

## 2011-09-08 LAB — CBC
HCT: 37.9
Hemoglobin: 13
MCHC: 34.2
MCV: 94.7
RBC: 4.01

## 2011-09-08 LAB — DIFFERENTIAL
Basophils Relative: 0
Monocytes Absolute: 0.9
Monocytes Relative: 6
Neutro Abs: 11.3 — ABNORMAL HIGH

## 2011-09-09 LAB — BASIC METABOLIC PANEL
CO2: 31
Calcium: 8.8
Glucose, Bld: 100 — ABNORMAL HIGH
Potassium: 4
Sodium: 141

## 2011-09-09 LAB — PROTIME-INR
INR: 1
Prothrombin Time: 13

## 2011-09-09 LAB — APTT: aPTT: 26

## 2011-09-10 LAB — POCT I-STAT, CHEM 8
HCT: 38
Hemoglobin: 12.9
Sodium: 140
TCO2: 29

## 2011-09-10 LAB — PROTIME-INR
INR: 2.3 — ABNORMAL HIGH
Prothrombin Time: 26.3 — ABNORMAL HIGH

## 2011-09-23 LAB — COMPREHENSIVE METABOLIC PANEL
ALT: 28
ALT: 29
AST: 23
AST: 24
Albumin: 3.1 — ABNORMAL LOW
CO2: 28
Calcium: 8.6
Calcium: 8.7
Creatinine, Ser: 0.71
Creatinine, Ser: 0.79
GFR calc Af Amer: >60
GFR calc Af Amer: >60
GFR calc non Af Amer: >60
Sodium: 133 — ABNORMAL LOW
Sodium: 137
Total Protein: 6.5
Total Protein: 6.6

## 2011-09-23 LAB — URINALYSIS, ROUTINE W REFLEX MICROSCOPIC
Nitrite: NEGATIVE
Protein, ur: NEGATIVE
Specific Gravity, Urine: 1.004 — ABNORMAL LOW
Urobilinogen, UA: 0.2

## 2011-09-23 LAB — DIFFERENTIAL
Eosinophils Absolute: 0.7
Eosinophils Absolute: 0.7
Eosinophils Relative: 8 — ABNORMAL HIGH
Eosinophils Relative: 9 — ABNORMAL HIGH
Lymphocytes Relative: 22
Lymphocytes Relative: 24
Lymphs Abs: 1.6
Lymphs Abs: 1.9
Monocytes Absolute: 0.8 — ABNORMAL HIGH
Monocytes Relative: 10
Monocytes Relative: 11
Neutrophils Relative %: 57

## 2011-09-23 LAB — HEMOGLOBIN A1C: Hgb A1c MFr Bld: 6.3 — ABNORMAL HIGH

## 2011-09-23 LAB — TROPONIN I
Troponin I: 0.01
Troponin I: 0.01

## 2011-09-23 LAB — CK TOTAL AND CKMB (NOT AT ARMC)
CK, MB: 3
Relative Index: 2.7 — ABNORMAL HIGH
Total CK: 107
Total CK: 110

## 2011-09-23 LAB — APTT: aPTT: 53 — ABNORMAL HIGH

## 2011-09-23 LAB — URINE MICROSCOPIC-ADD ON

## 2011-09-23 LAB — CBC
MCV: 93.8
Platelets: 297
Platelets: 300
RBC: 3.86 — ABNORMAL LOW
RBC: 3.91
WBC: 7.4
WBC: 8

## 2011-09-23 LAB — PROTIME-INR
INR: 2.1 — ABNORMAL HIGH
INR: 2.2 — ABNORMAL HIGH

## 2011-10-20 ENCOUNTER — Other Ambulatory Visit: Payer: Self-pay | Admitting: Urology

## 2011-11-01 ENCOUNTER — Encounter (HOSPITAL_COMMUNITY): Payer: Self-pay | Admitting: Pharmacy Technician

## 2011-11-08 ENCOUNTER — Encounter (HOSPITAL_COMMUNITY)
Admission: RE | Admit: 2011-11-08 | Discharge: 2011-11-08 | Disposition: A | Discharge status: Home / Self Care | Payer: Medicare Other | Source: Ambulatory Visit | Attending: Urology | Admitting: Urology

## 2011-11-08 ENCOUNTER — Other Ambulatory Visit: Payer: Self-pay

## 2011-11-08 ENCOUNTER — Ambulatory Visit (HOSPITAL_COMMUNITY)
Admission: RE | Admit: 2011-11-08 | Discharge: 2011-11-08 | Disposition: A | Discharge status: Home / Self Care | Payer: Medicare Other | Source: Ambulatory Visit | Attending: Urology | Admitting: Urology

## 2011-11-08 ENCOUNTER — Encounter (HOSPITAL_COMMUNITY): Payer: Self-pay

## 2011-11-08 DIAGNOSIS — Z01812 Encounter for preprocedural laboratory examination: Secondary | ICD-10-CM | POA: Insufficient documentation

## 2011-11-08 DIAGNOSIS — Z01810 Encounter for preprocedural cardiovascular examination: Secondary | ICD-10-CM | POA: Insufficient documentation

## 2011-11-08 DIAGNOSIS — D4989 Neoplasm of unspecified behavior of other specified sites: Secondary | ICD-10-CM | POA: Insufficient documentation

## 2011-11-08 HISTORY — DX: Atherosclerotic heart disease of native coronary artery without angina pectoris: I25.10

## 2011-11-08 HISTORY — DX: Gastro-esophageal reflux disease without esophagitis: K21.9

## 2011-11-08 LAB — BASIC METABOLIC PANEL
BUN: 12 mg/dL (ref 6–23)
Chloride: 103 mEq/L (ref 96–112)
Creatinine, Ser: 0.66 mg/dL (ref 0.50–1.10)
Glucose, Bld: 89 mg/dL (ref 70–99)
Potassium: 3.6 mEq/L (ref 3.5–5.1)

## 2011-11-08 LAB — CBC
HCT: 40.4 % (ref 36.0–46.0)
Hemoglobin: 13.2 g/dL (ref 12.0–15.0)
MCHC: 32.7 g/dL (ref 30.0–36.0)
MCV: 97.6 fL (ref 78.0–100.0)
WBC: 8 10*3/uL (ref 4.0–10.5)

## 2011-11-08 LAB — APTT: aPTT: 32 seconds (ref 24–37)

## 2011-11-08 LAB — SURGICAL PCR SCREEN: Staphylococcus aureus: NEGATIVE

## 2011-11-08 NOTE — Pre-Procedure Instructions (Signed)
Pt. Stopped Coumadin 11/07/2011 and was to be bridges by Lovenox per Dr. Annabell Howells- Dr. Lynne [name] office knows nothing about this or for her to have labs on 11/10/2011 prior to surgery 11/11/2011. Informed daughter Eunice Blase to call Dr. Lynne [name] office to resolve this and let Dr. Belva Crome office know about this. Daughter voiced understanding.

## 2011-11-08 NOTE — Patient Instructions (Signed)
20 [name]  11/08/2011   Your procedure is scheduled on:  Thurs. 11/11/2011  Report to [facility] at 0830 AM.  Call this number if you have problems the morning of surgery: [phone]   Remember:Pre-admission dept. -[phone]   Do not eat food:After Midnight.  May have clear liquids:until Midnight .  Clear liquids include soda, tea, black coffee, apple or grape juice, broth.  Take these medicines the morning of surgery with A SIP OF WATER: Klonopin,Metoprolol, Prilosec   Do not wear jewelry, make-up or nail polish.  Do not wear lotions, powders, or perfumes.   Do not shave 48 hours prior to surgery. (women only)  Do not bring valuables to the hospital.  Contacts, dentures or bridgework may not be worn into surgery.  Leave suitcase in the car. After surgery it may be brought to your room.  For patients admitted to the hospital, checkout time is 11:00 AM the day of discharge.   Patients discharged the day of surgery will not be allowed to drive home.  Name and phone number of your driver: [phone]  Special Instructions: CHG Shower Use Special Wash: 1/2 bottle night before surgery and 1/2 bottle morning of surgery.   Please read over the following fact sheets that you were given: MRSA Information

## 2011-11-10 DIAGNOSIS — C679 Malignant neoplasm of bladder, unspecified: Secondary | ICD-10-CM | POA: Diagnosis present

## 2011-11-10 MED ORDER — MITOMYCIN CHEMO FOR BLADDER INSTILLATION 40 MG
40.0000 mg | Freq: Once | INTRAVENOUS | Status: AC
  Administered 2011-11-11: 40 mg via INTRAVESICAL
  Filled 2011-11-10: qty 40

## 2011-11-10 NOTE — H&P (Signed)
Active Problems Problems  1. History of  Bladder Cancer V10.51 2. CXR Lungs Solitary Pulmonary Nodule (0.3  Cm) 793.11 3. Malignant Neoplasm Of The [facility] Of The Bladder 188.1 4. Microscopic Hematuria 599.72 5. 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 Stress Incontinence 788.39 7. Urge Incontinence Of Urine 788.31  History of Present Illness      [name] returns today in f/u for bladder biopsy and possible TURBT with instillation of MMC.  She has a history of bladder cancer with her last resection in 7/10.  She has chronic microhematuria and has had a patch of atypia at the bladder neck anteriorly that is chronic as well.  She had stable findings on cystoscopy on 4/25 but her cytology had atypia with a positive FISH.  She had a CT scan done in 4/12 for evaluation of the upper tracts.  The CT showed no urologic abnormalities but there was a small 3mm pulmonary nodule and the report was sent to the PCP to determine f/u for the nodule.   She denies gross hematuria.  She has had some dysuria recently with a full bladder.   She has mixed incontinence and wears a pad.   Past Medical History Problems  1. History of  Arthritis V13.4 2. History of  Atrial Fibrillation 427.31 3. History of  Bladder Cancer V10.51 4. History of  Bladder Cancer V10.51 5. History of  Bladder Disorders 596.9 6. History of  Chronic Cystitis 595.2 7. History of  Esophageal Reflux 530.81 8. History of  Hypercholesterolemia 272.0 9. History of  Malignant Neoplasm Of The [facility] Of The Bladder V10.51 10. History of  Vaginal Candidiasis 112.1 11. History of  Vaginitis 616.10 12. History of  Vaginitis 616.10  Surgical History Problems  1. History of  Bladder Surgery 2. History of  Cystoscopy Bladder Tumor 596.9 3. History of  Cystoscopy With Fulguration Medium Lesion (2-5cm) 4. History of  Cystoscopy With Fulguration Medium Lesion (2-5cm) 5. History of  Hysterectomy  Current Meds 1. Benadryl TABS; Therapy:  (Recorded:14Apr2008) to 2. Calcium + D TABS; Therapy: (Recorded:28Mar2008) to 3. Claritin 10 MG Oral Tablet; Therapy: (Recorded:18May2009) to 4. Coumadin 5 MG Oral Tablet; Therapy: (Recorded:27Oct2008) to 5. Crestor 5 MG Oral Tablet; Therapy: (Recorded:14Apr2008) to 6. Cyclobenzaprine HCl 10 MG Oral Tablet; Therapy: 03Feb2011 to 7. Diltiazem HCl Coated Beads 240 MG Oral Capsule Extended Release 24 Hour; Therapy:  14Dec2010 to 8. Fish Oil CAPS; Therapy: (Recorded:28Mar2008) to 9. Klor-Con M10 10 MEQ Oral Tablet Extended Release; Therapy: 04Jan2011 to 10. Lasix 40 MG Oral Tablet; Therapy: (Recorded:18May2009) to 11. Metoprolol Succinate 50 MG Oral Tablet Extended Release 24 Hour; Therapy: 11Nov2010 to 12. Nystop 100000 UNIT/GM External Powder; APPLY 2-3 TIMES DAILY TO AFFECTED AREA(S);   Therapy: 07Dec2009 to (Last Rx:07Dec2009) 13. Omeprazole 20 MG Oral Capsule Delayed Release; Therapy: 29Aug2010 to 14. Sulfacetamide Sodium 10 % Ophthalmic Solution; Therapy: 21Jun2011 to  Allergies Medication  1. Contrast Media Ready-Box MISC 2. Penicillins  Family History Problems  1. Maternal history of  Cancer 199.1 UTERINE 2. Sororal history of  Coronary Artery Disease 3. Paternal history of  Death In The Family Father Old Age Age 41 4. Maternal history of  Death In The Family Mother CancerAge 62 5. Family history of  Family Health Status Number Of Children 1 SON, 2 DAUGHTERS 6. Sororal history of  Leukemia V16.6 7. Fraternal history of  Prostate Cancer V16.42 8. Family history of  Tuberculosis 011.90 GRANDMOTHER  Social History Problems  1. Alcohol Use 1 PER DAY 2.  Marital History - Widowed 3. Occupation: RETIRED 4. Tobacco Use V15.82 SMOKED 10 YRS - QUIT 30 YRS Denied  5. Caffeine Use   Past and social history reviewed and updated.   Review of Systems  Cardiovascular: palpitations and leg swelling, but no chest pain.  Respiratory: shortness of breath.    Vitals Vital  Signs [Data Includes: Last 1 Day]  22Oct2012 02:59PM  Blood Pressure: 138 / 78 Temperature: 98.3 F Heart Rate: 85  Physical Exam Constitutional: Well nourished and well developed . No acute distress.  Pulmonary: No respiratory distress and normal respiratory rhythm and effort.  Cardiovascular:. No peripheral edema. The rhythm was irregularly irregular.    Results/Data Urine [Data Includes: Last 1 Day]  22Oct2012  COLOR: YELLOW  Reference Range YELLOW APPEARANCE: CLEAR  Reference Range CLEAR SPECIFIC GRAVITY: <1.005  Abnormal Low Reference Range 1.005-1.030 pH: 6.0  Reference Range 5.0-8.0 GLUCOSE: NEG mg/dL Reference Range NEG BILIRUBIN: NEG  Reference Range NEG KETONE: NEG mg/dL Reference Range NEG BLOOD: MOD  Abnormal Reference Range NEG PROTEIN: NEG mg/dL Reference Range NEG UROBILINOGEN: 0.2 mg/dL Reference Range 4.0-9.8 NITRITE: NEG  Reference Range NEG LEUKOCYTE ESTERASE: NEG  Reference Range NEG SQUAMOUS EPITHELIAL/HPF: RARE  Reference Range RARE WBC: 0-3 WBC/hpf Reference Range <4 RBC: 7-10 RBC/hpf Abnormal Reference Range <4 BACTERIA: NONE SEEN  Reference Range RARE CRYSTALS: NONE SEEN  Reference Range NEG CASTS: NONE SEEN  Reference Range NEG  Procedure  Procedure: Cystoscopy  Indication: History of Urothelial Carcinoma.  Informed Consent: Risks, benefits, and potential adverse events were discussed and informed consent was obtained from the patient.  Prep: The patient was prepped with betadine.  Procedure Note:  Urethral meatus:. No abnormalities.  Anterior urethra: No abnormalities.  Bladder: Visulization was clear. The ureteral orifices were in the normal anatomic position bilaterally and had clear efflux of urine. 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 The patient tolerated the procedure well.  Complications: None.    Assessment Assessed  1. History of  Bladder Cancer V10.51 2. Malignant Neoplasm Of The [facility] Of The Bladder 188.1 3. Microscopic Hematuria 599.72 4. Urge Incontinence Of Urine 788.31 5. Stress Incontinence 788.39   She has a probable recurrent tumor on the dome of the bladder. She has stable incontinence.   Plan Health Maintenance (V70.0)  1. UA With REFLEX  Done: 22Oct2012 03:21PM Malignant Neoplasm Of The [facility] Of The Bladder (188.1)  2. Follow-up Schedule Surgery Office  Follow-up  Requested for: 22Oct2012 PMH: Bladder Cancer (V10.51)  3. URINE CYTOLOGY W/REFLEX FISH  Requested for: 22Oct2012   I am going to set her up for cystoscopy with TURBT and possible M[facility] instillations.  The risks were reviewed. Urine cytology today. She will need to be off of Coumadin and on lovenox bridging per Dr. Ricki Miller.

## 2011-11-11 ENCOUNTER — Encounter (HOSPITAL_COMMUNITY): Payer: Self-pay | Admitting: *Deleted

## 2011-11-11 ENCOUNTER — Ambulatory Visit (HOSPITAL_COMMUNITY)
Admission: RE | Admit: 2011-11-11 | Discharge: 2011-11-11 | Disposition: A | Discharge status: Home / Self Care | Payer: Medicare Other | Source: Ambulatory Visit | Attending: Urology | Admitting: Urology

## 2011-11-11 ENCOUNTER — Encounter (HOSPITAL_COMMUNITY): Payer: Self-pay | Admitting: Anesthesiology

## 2011-11-11 ENCOUNTER — Other Ambulatory Visit: Payer: Self-pay | Admitting: Urology

## 2011-11-11 ENCOUNTER — Encounter (HOSPITAL_COMMUNITY)
Admission: RE | Disposition: A | Discharge status: Home / Self Care | Payer: Self-pay | Source: Ambulatory Visit | Attending: Urology

## 2011-11-11 ENCOUNTER — Ambulatory Visit (HOSPITAL_COMMUNITY): Payer: Medicare Other | Admitting: Anesthesiology

## 2011-11-11 DIAGNOSIS — I4891 Unspecified atrial fibrillation: Secondary | ICD-10-CM | POA: Insufficient documentation

## 2011-11-11 DIAGNOSIS — J40 Bronchitis, not specified as acute or chronic: Secondary | ICD-10-CM | POA: Insufficient documentation

## 2011-11-11 DIAGNOSIS — N3946 Mixed incontinence: Secondary | ICD-10-CM | POA: Insufficient documentation

## 2011-11-11 DIAGNOSIS — R3129 Other microscopic hematuria: Secondary | ICD-10-CM | POA: Insufficient documentation

## 2011-11-11 DIAGNOSIS — K219 Gastro-esophageal reflux disease without esophagitis: Secondary | ICD-10-CM | POA: Insufficient documentation

## 2011-11-11 DIAGNOSIS — C679 Malignant neoplasm of bladder, unspecified: Secondary | ICD-10-CM | POA: Diagnosis present

## 2011-11-11 DIAGNOSIS — Z7901 Long term (current) use of anticoagulants: Secondary | ICD-10-CM | POA: Insufficient documentation

## 2011-11-11 DIAGNOSIS — N952 Postmenopausal atrophic vaginitis: Secondary | ICD-10-CM | POA: Insufficient documentation

## 2011-11-11 DIAGNOSIS — E7800 Pure hypercholesterolemia, unspecified: Secondary | ICD-10-CM | POA: Insufficient documentation

## 2011-11-11 DIAGNOSIS — Z79899 Other long term (current) drug therapy: Secondary | ICD-10-CM | POA: Insufficient documentation

## 2011-11-11 DIAGNOSIS — C671 Malignant neoplasm of dome of bladder: Secondary | ICD-10-CM | POA: Insufficient documentation

## 2011-11-11 HISTORY — PX: TRANSURETHRAL RESECTION OF BLADDER TUMOR: SHX2575

## 2011-11-11 HISTORY — PX: CYSTOSCOPY: SHX5120

## 2011-11-11 LAB — PROTIME-INR: Prothrombin Time: 14.3 seconds (ref 11.6–15.2)

## 2011-11-11 SURGERY — TURBT (TRANSURETHRAL RESECTION OF BLADDER TUMOR)
Anesthesia: General | Site: Bladder | Wound class: Clean Contaminated

## 2011-11-11 MED ORDER — LACTATED RINGERS IV SOLN
INTRAVENOUS | Status: DC
  Administered 2011-11-11: 1000 mL via INTRAVENOUS

## 2011-11-11 MED ORDER — SODIUM CHLORIDE 0.9 % IR SOLN
Status: DC | PRN
  Administered 2011-11-11: 6000 mL

## 2011-11-11 MED ORDER — LIDOCAINE HCL (CARDIAC) 10 MG/ML IV SOLN
INTRAVENOUS | Status: DC | PRN
  Administered 2011-11-11: 50 mg via INTRAVENOUS

## 2011-11-11 MED ORDER — PROPOFOL 10 MG/ML IV BOLUS
INTRAVENOUS | Status: DC | PRN
  Administered 2011-11-11: 100 mg via INTRAVENOUS
  Administered 2011-11-11: 30 mg via INTRAVENOUS

## 2011-11-11 MED ORDER — CIPROFLOXACIN IN D5W 400 MG/200ML IV SOLN
400.0000 mg | Freq: Two times a day (BID) | INTRAVENOUS | Status: DC
  Administered 2011-11-11: 400 mg via INTRAVENOUS
  Filled 2011-11-11: qty 200

## 2011-11-11 MED ORDER — CIPROFLOXACIN HCL 250 MG PO TABS: 250.0000 mg | ORAL_TABLET | Freq: Two times a day (BID) | ORAL | Status: AC

## 2011-11-11 MED ORDER — PHENYLEPHRINE HCL 10 MG/ML IJ SOLN
INTRAMUSCULAR | Status: DC | PRN
  Administered 2011-11-11: 80 ug via INTRAVENOUS

## 2011-11-11 MED ORDER — PHENAZOPYRIDINE HCL 200 MG PO TABS: 200.0000 mg | ORAL_TABLET | Freq: Three times a day (TID) | ORAL | Status: AC | PRN

## 2011-11-11 MED ORDER — ONDANSETRON HCL 4 MG/2ML IJ SOLN
INTRAMUSCULAR | Status: DC | PRN
  Administered 2011-11-11: 4 mg via INTRAVENOUS

## 2011-11-11 MED ORDER — FENTANYL CITRATE 0.05 MG/ML IJ SOLN: 25.0000 ug | INTRAMUSCULAR | Status: DC | PRN

## 2011-11-11 MED ORDER — FENTANYL CITRATE 0.05 MG/ML IJ SOLN
INTRAMUSCULAR | Status: DC | PRN
  Administered 2011-11-11 (×2): 25 ug via INTRAVENOUS
  Administered 2011-11-11: 50 ug via INTRAVENOUS

## 2011-11-11 MED ORDER — METOCLOPRAMIDE HCL 5 MG/ML IJ SOLN: 10.0000 mg | Freq: Once | INTRAMUSCULAR | Status: DC | PRN

## 2011-11-11 MED ORDER — WARFARIN SODIUM 5 MG PO TABS: 5.0000 mg | ORAL_TABLET | Freq: Every evening | ORAL | Status: DC

## 2011-11-11 SURGICAL SUPPLY — 27 items
BAG URINE DRAINAGE (UROLOGICAL SUPPLIES) IMPLANT
BAG URO CATCHER STRL LF (DRAPE) ×2 IMPLANT
CATH FOLEY 2WAY SLVR  5CC 20FR (CATHETERS) ×1
CATH FOLEY 2WAY SLVR 5CC 20FR (CATHETERS) ×1 IMPLANT
CATH FOLEY 3WAY 30CC 22FR (CATHETERS) IMPLANT
CATH ROBINSON RED A/P 16FR (CATHETERS) IMPLANT
CLOTH BEACON ORANGE TIMEOUT ST (SAFETY) ×2 IMPLANT
DRAPE CAMERA CLOSED 9X96 (DRAPES) ×2 IMPLANT
ELECT REM PT RETURN 9FT ADLT (ELECTROSURGICAL)
ELECTRODE KNIFE URO 27FR PED (UROLOGICAL SUPPLIES) IMPLANT
ELECTRODE REM PT RTRN 9FT ADLT (ELECTROSURGICAL) IMPLANT
GLOVE SURG SS PI 8.0 STRL IVOR (GLOVE) ×6 IMPLANT
GOWN PREVENTION PLUS XLARGE (GOWN DISPOSABLE) ×2 IMPLANT
GOWN STRL REIN XL XLG (GOWN DISPOSABLE) ×2 IMPLANT
HOLDER FOLEY CATH W/STRAP (MISCELLANEOUS) IMPLANT
KIT ASPIRATION TUBING (SET/KITS/TRAYS/PACK) ×2 IMPLANT
KNIFE COLLINS 24FR (ELECTROSURGICAL) IMPLANT
LOOPS CUTTING 24FR (ELECTROSURGICAL) IMPLANT
LOOPS RESECTOSCOPE DISP (ELECTROSURGICAL) ×2 IMPLANT
MANIFOLD NEPTUNE II (INSTRUMENTS) ×2 IMPLANT
MARKER SKIN DUAL TIP RULER LAB (MISCELLANEOUS) IMPLANT
PACK CYSTO (CUSTOM PROCEDURE TRAY) ×2 IMPLANT
ROLLER BALL 3MM 27FR (ELECTROSURGICAL) IMPLANT
SUT ETHILON 3 0 PS 1 (SUTURE) IMPLANT
SYR 30ML LL (SYRINGE) ×2 IMPLANT
SYRINGE IRR TOOMEY STRL 70CC (SYRINGE) ×2 IMPLANT
TUBING CONNECTING 10 (TUBING) ×2 IMPLANT

## 2011-11-11 NOTE — Progress Notes (Signed)
Receive from OR with catheter 1234

## 2011-11-11 NOTE — Op Note (Signed)
NAMEMarland Kitchen  AMALYA, SALMONS NO.:  [id number]  MEDICAL RECORD NO.:  [id number]  LOCATION:  [facility]                         FACILITY:  [facility]  PHYSICIAN:  Excell Seltzer. Annabell Howells, M.D.    DATE OF BIRTH:  1921-04-22  DATE OF PROCEDURE: DATE OF DISCHARGE:                              OPERATIVE REPORT   PROCEDURE: 1. Cystoscopy. 2. Transurethral resection of 2-5 cm bladder tumor. 3. Instillation of mitomycin-C in PACU.  PREOPERATIVE DIAGNOSIS:  Bladder tumor.  POSTOPERATIVE DIAGNOSIS:  Bladder tumor.  SURGEON:  Excell Seltzer. Annabell Howells, M.D.  ANESTHESIA:  General.  SPECIMEN:  Bladder tumor biopsies from dome.  DRAIN:  20-French Foley catheter.  COMPLICATIONS:  None.  INDICATIONS:  Ms. Mcdonnell is an 77 year old white female with history of bladder tumor.  She on recent surveillance cystoscopy, was noted to have a patchy area of new lesions on the dome of the bladder, suspicious for recurrent transitional cell carcinoma.  She also has an abnormal patch at the anterior bladder neck, that has been biopsied several times, and returned as atypia that is still present.  It was felt that biopsy, fulguration, and resection of the new lesions as well a few old lesions were indicated.  FINDINGS OF PROCEDURE:  She was taken to the operating room, where she was given Cipro.  She was given a general anesthetic and placed in lithotomy position.  PAS hose were placed.  Her perineum and genitalia were prepped with Betadine solution.  She was draped in usual sterile fashion.  Cystoscopy was performed using a 22-French scope and 12 and 70 degree lenses.  Examination revealed a normal urethra.  The ureteral orifices were unremarkable.  The bladder wall was inspected, that was smooth without trabeculation.  At the anterior bladder neck in the midline was approximately a 2 cm patch of shaggy erythematous material that has been present for some time and on biopsy, has been atypia.  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  Because of the presumed thinness of the patient's bladder at age 33, I elected to do cup biopsies of the new lesions on the dome of the bladder.  Two biopsies were obtained, sampling the larger areas of tumor prominence.  Once the biopsies were obtained, inspection revealed no bladder perforations and the cystoscope was then replaced with a 28-French continuous flow resectoscope sheath with an Wandra Scot handle with gyrus loop and a 30-degree lens.  Using this instrument, the remaining tumors and the biopsy sites were widely fulgurated covering an area approximately 3-4 cm in greatest dimension.  Once the portion of this area was fulgurated, I fulgurated the atypical area at the anterior bladder neck as well.  At this point, the bladder was drained and reinspected.  No active bleeding was noted.  A 20-French Foley catheter was inserted.  The balloon was filled with 10 cc.  The bladder was drained.  The catheter was placed to straight drainage.  The patient was taken down from lithotomy position.  Her anesthetic was reversed.  She was moved to recovery room in stable condition.  In the recovery room, her bladder was  instilled with 40 mg of mitomycin-C and 40 cc of diluent and the catheter was plugged for 30 minutes and then drained.  There were no complications during the procedure.     Excell Seltzer. Annabell Howells, M.D.     JJW/MEDQ  D:  11/11/2011  T:  11/11/2011  Job:  [id number]  cc:   Juline Patch, M.D. Fax: [phone]

## 2011-11-11 NOTE — Transfer of Care (Signed)
Immediate Anesthesia Transfer of Care Note  Patient: [name]  Procedure(s) Performed:  TRANSURETHRAL RESECTION OF BLADDER TUMOR (TURBT) - Cysto, Bladder Biopsy, TURBT with Gyrus, ; CYSTOSCOPY  Patient Location: PACU  Anesthesia Type: General  Level of Consciousness: awake, alert , oriented and responds to stimulation  Airway & Oxygen Therapy: Patient Spontanous Breathing and Patient connected to face mask oxygen  Post-op Assessment: Report given to PACU RN and Post -op Vital signs reviewed and stable  Post vital signs: stable  Complications: No apparent anesthesia complications

## 2011-11-11 NOTE — OR Nursing (Signed)
Mitomycin dwell 1110-1140 by Dr Annabell Howells.

## 2011-11-11 NOTE — Brief Op Note (Signed)
11/11/2011  10:55 AM  PATIENT:  [name]  77 y.o. female  PRE-OPERATIVE DIAGNOSIS:  bladder tumor   POST-OPERATIVE DIAGNOSIS:  bladder tumor  PROCEDURE:  Procedure(s): TRANSURETHRAL RESECTION OF BLADDER TUMOR (TURBT) 2-5CM CYSTOSCOPY INSTILLATION OF MITOMYCIN C (in PACU)  SURGEON:  Surgeon(s): Anner Crete  PHYSICIAN ASSISTANT:   ASSISTANTS: none   ANESTHESIA:   general  EBL:     BLOOD ADMINISTERED:none  DRAINS: Urinary Catheter (Foley)   LOCAL MEDICATIONS USED:  NONE  SPECIMEN:  Biopsy / Limited Resection  DISPOSITION OF SPECIMEN:  PATHOLOGY  COUNTS:  YES  TOURNIQUET:  * No tourniquets in log *  DICTATION: .Other Dictation: Dictation Number [id number]  PLAN OF CARE: Discharge to home after PACU  PATIENT DISPOSITION:  PACU - hemodynamically stable.   Delay start of Pharmacological VTE agent (>24hrs) due to surgical blood loss or risk of bleeding:  {YES/NO/NOT APPLICABLE:20182

## 2011-11-11 NOTE — Anesthesia Preprocedure Evaluation (Addendum)
Anesthesia Evaluation  Patient identified by MRN, date of birth, ID band Patient awake    Reviewed: Allergy & Precautions, H&P , NPO status , Patient's Chart, lab work & pertinent test results  Airway Mallampati: II TM Distance: >3 FB Neck ROM: Full    Dental No notable dental hx. (+) Edentulous Upper and Edentulous Lower   Pulmonary neg pulmonary ROS, former smoker Recent bronchitis. Minimal SOB with exertion. Room air sats 99%.  clear to auscultation  Pulmonary exam normal + decreased breath sounds      Cardiovascular hypertension, Pt. on home beta blockers + CAD and neg cardio ROS Irregular Normal    Neuro/Psych  Headaches,  Neuromuscular disease Negative Neurological ROS  Negative Psych ROS   GI/Hepatic negative GI ROS, Neg liver ROS,   Endo/Other  Negative Endocrine ROS  Renal/GU negative Renal ROS  Genitourinary negative   Musculoskeletal negative musculoskeletal ROS (+)   Abdominal   Peds negative pediatric ROS (+)  Hematology negative hematology ROS (+)   Anesthesia Other Findings   Reproductive/Obstetrics negative OB ROS                           Anesthesia Physical Anesthesia Plan  ASA: III  Anesthesia Plan: General   Post-op Pain Management:    Induction: Intravenous  Airway Management Planned: LMA  Additional Equipment:   Intra-op Plan:   Post-operative Plan: Extubation in OR  Informed Consent: I have reviewed the patients History and Physical, chart, labs and discussed the procedure including the risks, benefits and alternatives for the proposed anesthesia with the patient or authorized representative who has indicated his/her understanding and acceptance.   Dental advisory given  Plan Discussed with: CRNA  Anesthesia Plan Comments: (Off coumadin 5 days. Plan general)        Anesthesia Quick Evaluation Also on recent 3 doses of Lovenox. Last dose  yesterday, amount unknown

## 2011-11-11 NOTE — Interval H&P Note (Signed)
History and Physical Interval Note:  11/11/2011 8:01 AM  [name]  has presented today for surgery, with the diagnosis of bladder tumor   The various methods of treatment have been discussed with the patient and family. After consideration of risks, benefits and other options for treatment, the patient has consented to  Procedure(s): TRANSURETHRAL RESECTION OF BLADDER TUMOR (TURBT) CYSTOSCOPY as a surgical intervention .  The patients' history has been reviewed, patient examined, no change in status, stable for surgery.  I have reviewed the patients' chart and labs.  Questions were answered to the patient's satisfaction.     , J

## 2011-11-11 NOTE — Anesthesia Postprocedure Evaluation (Signed)
  Anesthesia Post-op Note  Patient: [name]  Procedure(s) Performed:  TRANSURETHRAL RESECTION OF BLADDER TUMOR (TURBT) - Cysto, Bladder Biopsy, TURBT with Gyrus, ; CYSTOSCOPY  Patient Location: PACU  Anesthesia Type: General  Level of Consciousness: awake and alert   Airway and Oxygen Therapy: Patient Spontanous Breathing  Post-op Pain: mild  Post-op Assessment: Post-op Vital signs reviewed, Patient's Cardiovascular Status Stable, Respiratory Function Stable, Patent Airway and No signs of Nausea or vomiting  Post-op Vital Signs: stable  Complications: No apparent anesthesia complications

## 2011-11-12 ENCOUNTER — Encounter (HOSPITAL_COMMUNITY): Payer: Self-pay | Admitting: Urology

## 2011-12-16 DIAGNOSIS — C671 Malignant neoplasm of dome of bladder: Secondary | ICD-10-CM | POA: Diagnosis not present

## 2011-12-21 DIAGNOSIS — I4891 Unspecified atrial fibrillation: Secondary | ICD-10-CM | POA: Diagnosis not present

## 2011-12-21 DIAGNOSIS — E7800 Pure hypercholesterolemia, unspecified: Secondary | ICD-10-CM | POA: Diagnosis not present

## 2011-12-21 DIAGNOSIS — Z7901 Long term (current) use of anticoagulants: Secondary | ICD-10-CM | POA: Diagnosis not present

## 2011-12-23 DIAGNOSIS — R82998 Other abnormal findings in urine: Secondary | ICD-10-CM | POA: Diagnosis not present

## 2011-12-23 DIAGNOSIS — C671 Malignant neoplasm of dome of bladder: Secondary | ICD-10-CM | POA: Diagnosis not present

## 2011-12-28 DIAGNOSIS — E7800 Pure hypercholesterolemia, unspecified: Secondary | ICD-10-CM | POA: Diagnosis not present

## 2011-12-30 DIAGNOSIS — C671 Malignant neoplasm of dome of bladder: Secondary | ICD-10-CM | POA: Diagnosis not present

## 2012-01-04 DIAGNOSIS — I4891 Unspecified atrial fibrillation: Secondary | ICD-10-CM | POA: Diagnosis not present

## 2012-01-04 DIAGNOSIS — Z7901 Long term (current) use of anticoagulants: Secondary | ICD-10-CM | POA: Diagnosis not present

## 2012-01-05 DIAGNOSIS — IMO0002 Reserved for concepts with insufficient information to code with codable children: Secondary | ICD-10-CM | POA: Diagnosis not present

## 2012-01-05 DIAGNOSIS — M1710 Unilateral primary osteoarthritis, unspecified knee: Secondary | ICD-10-CM | POA: Diagnosis not present

## 2012-01-06 DIAGNOSIS — C671 Malignant neoplasm of dome of bladder: Secondary | ICD-10-CM | POA: Diagnosis not present

## 2012-01-13 DIAGNOSIS — C671 Malignant neoplasm of dome of bladder: Secondary | ICD-10-CM | POA: Diagnosis not present

## 2012-02-03 DIAGNOSIS — R109 Unspecified abdominal pain: Secondary | ICD-10-CM | POA: Diagnosis not present

## 2012-02-03 DIAGNOSIS — C671 Malignant neoplasm of dome of bladder: Secondary | ICD-10-CM | POA: Diagnosis not present

## 2012-02-03 DIAGNOSIS — R3915 Urgency of urination: Secondary | ICD-10-CM | POA: Diagnosis not present

## 2012-02-03 DIAGNOSIS — R300 Dysuria: Secondary | ICD-10-CM | POA: Diagnosis not present

## 2012-02-07 DIAGNOSIS — Z7901 Long term (current) use of anticoagulants: Secondary | ICD-10-CM | POA: Diagnosis not present

## 2012-02-07 DIAGNOSIS — I4891 Unspecified atrial fibrillation: Secondary | ICD-10-CM | POA: Diagnosis not present

## 2012-02-23 ENCOUNTER — Other Ambulatory Visit (HOSPITAL_COMMUNITY): Payer: Self-pay | Admitting: Internal Medicine

## 2012-02-23 DIAGNOSIS — Z1231 Encounter for screening mammogram for malignant neoplasm of breast: Secondary | ICD-10-CM

## 2012-02-28 DIAGNOSIS — R3915 Urgency of urination: Secondary | ICD-10-CM | POA: Diagnosis not present

## 2012-02-28 DIAGNOSIS — Z8551 Personal history of malignant neoplasm of bladder: Secondary | ICD-10-CM | POA: Diagnosis not present

## 2012-03-06 DIAGNOSIS — I4891 Unspecified atrial fibrillation: Secondary | ICD-10-CM | POA: Diagnosis not present

## 2012-03-06 DIAGNOSIS — Z7901 Long term (current) use of anticoagulants: Secondary | ICD-10-CM | POA: Diagnosis not present

## 2012-03-12 ENCOUNTER — Encounter (HOSPITAL_COMMUNITY): Payer: Self-pay

## 2012-03-12 ENCOUNTER — Emergency Department (INDEPENDENT_AMBULATORY_CARE_PROVIDER_SITE_OTHER)
Admission: EM | Admit: 2012-03-12 | Discharge: 2012-03-12 | Disposition: A | Discharge status: Home / Self Care | Payer: Medicare Other | Source: Home / Self Care | Attending: Family Medicine | Admitting: Family Medicine

## 2012-03-12 DIAGNOSIS — N390 Urinary tract infection, site not specified: Secondary | ICD-10-CM | POA: Diagnosis not present

## 2012-03-12 LAB — POCT URINALYSIS DIP (DEVICE)
Ketones, ur: NEGATIVE mg/dL
pH: 5.5 (ref 5.0–8.0)

## 2012-03-12 MED ORDER — SULFAMETHOXAZOLE-TRIMETHOPRIM 800-160 MG PO TABS: 1.0000 | ORAL_TABLET | Freq: Two times a day (BID) | ORAL | Status: AC

## 2012-03-12 MED ORDER — NITROFURANTOIN MONOHYD MACRO 100 MG PO CAPS: 100.0000 mg | ORAL_CAPSULE | Freq: Two times a day (BID) | ORAL | Status: DC

## 2012-03-12 NOTE — Discharge Instructions (Signed)
Take antibiotics as directed. Please follow up with your Urologist for continued evaluation and management. Return to care should your symptoms not improve, or worsen in any way, such as fever, increased symptoms, pain worsens, mental status changes, shortness of breath, or any other symptom that might concern you.

## 2012-03-12 NOTE — ED Notes (Signed)
Pt has burning and urgency with urination for several days,  She had TURBT in Nov and has had the burning since the surgery but worse last two days.

## 2012-03-13 LAB — URINE CULTURE
Colony Count: 4000
Culture  Setup Time: 201303312051

## 2012-03-20 DIAGNOSIS — I4891 Unspecified atrial fibrillation: Secondary | ICD-10-CM | POA: Diagnosis not present

## 2012-03-20 DIAGNOSIS — Z7901 Long term (current) use of anticoagulants: Secondary | ICD-10-CM | POA: Diagnosis not present

## 2012-03-22 ENCOUNTER — Ambulatory Visit (HOSPITAL_COMMUNITY)
Admission: RE | Admit: 2012-03-22 | Discharge: 2012-03-22 | Disposition: A | Discharge status: Home / Self Care | Payer: Medicare Other | Source: Ambulatory Visit | Attending: Internal Medicine | Admitting: Internal Medicine

## 2012-03-22 DIAGNOSIS — Z1231 Encounter for screening mammogram for malignant neoplasm of breast: Secondary | ICD-10-CM | POA: Diagnosis not present

## 2012-03-31 DIAGNOSIS — IMO0002 Reserved for concepts with insufficient information to code with codable children: Secondary | ICD-10-CM | POA: Diagnosis not present

## 2012-03-31 DIAGNOSIS — M1710 Unilateral primary osteoarthritis, unspecified knee: Secondary | ICD-10-CM | POA: Diagnosis not present

## 2012-03-31 DIAGNOSIS — M25569 Pain in unspecified knee: Secondary | ICD-10-CM | POA: Diagnosis not present

## 2012-04-03 DIAGNOSIS — Z7901 Long term (current) use of anticoagulants: Secondary | ICD-10-CM | POA: Diagnosis not present

## 2012-04-03 DIAGNOSIS — I4891 Unspecified atrial fibrillation: Secondary | ICD-10-CM | POA: Diagnosis not present

## 2012-04-13 NOTE — ED Provider Notes (Signed)
History     CSN: [id number]  Arrival date & time 03/12/12  1432   First MD Initiated Contact with Patient 03/12/12 1446      Chief Complaint  Patient presents with  . Cystitis    (Consider location/radiation/quality/duration/timing/severity/associated sxs/prior treatment) HPI Comments: [name] presents for evaluation of burning and urination over the last few days. She had surgery for a bladder tumor in November and has had intermittent symptoms since that time. She denies any fever, no nausea, no vomiting.   Patient is a 78 y.o. female presenting with dysuria. The history is provided by the patient.  Dysuria  This is a recurrent problem. The current episode started more than 2 days ago. The problem occurs every urination. The problem has not changed since onset.The quality of the pain is described as burning. There has been no fever. Associated symptoms include frequency and urgency. Pertinent negatives include no chills, no nausea, no hematuria and no flank pain. Her past medical history is significant for urological procedure.    Past Medical History  Diagnosis Date  . Coronary artery disease   . Hypertension   . Dysrhythmia     Atrial fibrillation  . GERD (gastroesophageal reflux disease)   . Headache   . Arthritis     Past Surgical History  Procedure Date  . Abdominal hysterectomy 1960    partial hysterectomy  . Back surgery 1976    tumor removed-benign  . Breast surgery     2 cysts removed-left breast-benign  . Eye surgery 1981    bilateral cataract with lens implant  . Tonsillectomy 1952  . Transurethral resection of bladder tumor 11/11/2011    Procedure: TRANSURETHRAL RESECTION OF BLADDER TUMOR (TURBT);  Surgeon: Anner Crete;  Location: [facility];  Service: Urology;  Laterality: N/A;  Cysto, Bladder Biopsy, TURBT with Gyrus,   . Cystoscopy 11/11/2011    Procedure: CYSTOSCOPY;  Surgeon: Anner Crete;  Location: [facility];  Service: Urology;  Laterality: N/A;     Family History  Problem Relation Age of Onset  . Anesthesia problems Daughter     History  Substance Use Topics  . Smoking status: Former Smoker -- 1.5 packs/day for 50 years    Types: Cigarettes    Quit date: 11/07/1982  . Smokeless tobacco: Not on file  . Alcohol Use: 0.6 oz/week    1 Glasses of wine per week    OB History    Grav Para Term Preterm Abortions TAB SAB Ect Mult Living                  Review of Systems  Constitutional: Negative.  Negative for chills.  HENT: Negative.   Eyes: Negative.   Respiratory: Negative.   Cardiovascular: Negative.   Gastrointestinal: Negative.  Negative for nausea.  Genitourinary: Positive for dysuria, urgency and frequency. Negative for hematuria and flank pain.  Musculoskeletal: Negative.   Skin: Negative.   Neurological: Negative.     Allergies  Contrast media and Penicillins  Home Medications   Current Outpatient Rx  Name Route Sig Dispense Refill  . CALCIUM + D PO Oral Take 1 tablet by mouth daily.     Marland Kitchen CINNAMON PO Oral Take 1 tablet by mouth daily.     Marland Kitchen CLONAZEPAM 0.5 MG PO TABS Oral Take 0.5 mg by mouth daily as needed. Anxiety     . DILTIAZEM HCL ER COATED BEADS 120 MG PO CP24 Oral Take 120 mg by mouth every morning.     Marland Kitchen  DIPHENHYDRAMINE HCL 25 MG PO TABS Oral Take 25 mg by mouth every morning.     Marland Kitchen OMEGA-3 FATTY ACIDS 1000 MG PO CAPS Oral Take 1 g by mouth 2 (two) times daily.     . FUROSEMIDE 40 MG PO TABS Oral Take 40 mg by mouth every morning.     Marland Kitchen HYDROCODONE-ACETAMINOPHEN 5-500 MG PO TABS Oral Take 1 tablet by mouth every 8 (eight) hours as needed. Pain     . MECLIZINE HCL 25 MG PO TABS Oral Take 25 mg by mouth 3 (three) times daily as needed. dizziness    . METHOCARBAMOL 750 MG PO TABS Oral Take 750 mg by mouth at bedtime.     Marland Kitchen METOPROLOL SUCCINATE ER 50 MG PO TB24 Oral Take 50 mg by mouth at bedtime.     Carma Leaven M PLUS PO TABS Oral Take 1 tablet by mouth daily.     Marland Kitchen OMEPRAZOLE 20 MG PO CPDR Oral  Take 20 mg by mouth daily.     Marland Kitchen POTASSIUM CHLORIDE 10 MEQ PO TBCR Oral Take 10 mEq by mouth every morning.     Marland Kitchen ROSUVASTATIN CALCIUM 5 MG PO TABS Oral Take 5 mg by mouth at bedtime.     . TRAMADOL HCL 50 MG PO TABS Oral Take 50 mg by mouth every 8 (eight) hours as needed. Maximum dose= 8 tablets per day. Pain     . WARFARIN SODIUM 5 MG PO TABS Oral Take 1 tablet (5 mg total) by mouth every evening. 30 tablet 0    She should resume her coumadin per Dr. Lynne [name] inst ...    BP 144/70  Pulse 93  Temp(Src) 97.9 F (36.6 C) (Oral)  Resp 20  SpO2 96%  Physical Exam  Nursing note and vitals reviewed. Constitutional: She is oriented to person, place, and time. She appears well-developed and well-nourished.  HENT:  Head: Normocephalic and atraumatic.  Eyes: EOM are normal.  Neck: Normal range of motion.  Pulmonary/Chest: Effort normal.  Abdominal: Soft. Normal appearance and bowel sounds are normal. There is no tenderness. There is no rebound, no guarding and no CVA tenderness.  Musculoskeletal: Normal range of motion.  Neurological: She is alert and oriented to person, place, and time.  Skin: Skin is warm and dry.  Psychiatric: Her behavior is normal.    ED Course  Procedures (including critical care time)  Labs Reviewed  POCT URINALYSIS DIP (DEVICE) - Abnormal; Notable for the following:    Bilirubin Urine SMALL (*)    Hgb urine dipstick LARGE (*)    Protein, ur 30 (*)    Leukocytes, UA SMALL (*) Biochemical Testing Only. Please order routine urinalysis from main lab if confirmatory testing is needed.   All other components within normal limits  URINE CULTURE  LAB REPORT - SCANNED   No results found.   1. UTI (lower urinary tract infection)       MDM  Labs reviewed; rx given for TMP-SMX; follow up with PCP         Renaee Munda, MD 04/13/12 1121

## 2012-04-17 DIAGNOSIS — I4891 Unspecified atrial fibrillation: Secondary | ICD-10-CM | POA: Diagnosis not present

## 2012-04-17 DIAGNOSIS — Z7901 Long term (current) use of anticoagulants: Secondary | ICD-10-CM | POA: Diagnosis not present

## 2012-04-18 DIAGNOSIS — H43819 Vitreous degeneration, unspecified eye: Secondary | ICD-10-CM | POA: Diagnosis not present

## 2012-04-24 DIAGNOSIS — H43819 Vitreous degeneration, unspecified eye: Secondary | ICD-10-CM | POA: Diagnosis not present

## 2012-05-08 ENCOUNTER — Emergency Department (HOSPITAL_COMMUNITY): Payer: Medicare Other

## 2012-05-08 ENCOUNTER — Encounter (HOSPITAL_COMMUNITY): Payer: Self-pay | Admitting: Emergency Medicine

## 2012-05-08 ENCOUNTER — Inpatient Hospital Stay (HOSPITAL_COMMUNITY)
Admission: EM | Admit: 2012-05-08 | Discharge: 2012-05-11 | DRG: 194 | Disposition: A | Discharge status: Home / Self Care | Payer: Medicare Other | Source: Ambulatory Visit | Attending: Family Medicine | Admitting: Family Medicine

## 2012-05-08 DIAGNOSIS — R319 Hematuria, unspecified: Secondary | ICD-10-CM | POA: Diagnosis present

## 2012-05-08 DIAGNOSIS — Z8551 Personal history of malignant neoplasm of bladder: Secondary | ICD-10-CM

## 2012-05-08 DIAGNOSIS — J984 Other disorders of lung: Secondary | ICD-10-CM | POA: Diagnosis not present

## 2012-05-08 DIAGNOSIS — K219 Gastro-esophageal reflux disease without esophagitis: Secondary | ICD-10-CM | POA: Diagnosis present

## 2012-05-08 DIAGNOSIS — C679 Malignant neoplasm of bladder, unspecified: Secondary | ICD-10-CM | POA: Diagnosis present

## 2012-05-08 DIAGNOSIS — I82401 Acute embolism and thrombosis of unspecified deep veins of right lower extremity: Secondary | ICD-10-CM | POA: Diagnosis present

## 2012-05-08 DIAGNOSIS — I10 Essential (primary) hypertension: Secondary | ICD-10-CM | POA: Diagnosis not present

## 2012-05-08 DIAGNOSIS — D696 Thrombocytopenia, unspecified: Secondary | ICD-10-CM

## 2012-05-08 DIAGNOSIS — Z87891 Personal history of nicotine dependence: Secondary | ICD-10-CM

## 2012-05-08 DIAGNOSIS — R5381 Other malaise: Secondary | ICD-10-CM | POA: Diagnosis not present

## 2012-05-08 DIAGNOSIS — R5383 Other fatigue: Secondary | ICD-10-CM | POA: Diagnosis not present

## 2012-05-08 DIAGNOSIS — D649 Anemia, unspecified: Secondary | ICD-10-CM | POA: Diagnosis present

## 2012-05-08 DIAGNOSIS — R7989 Other specified abnormal findings of blood chemistry: Secondary | ICD-10-CM | POA: Diagnosis present

## 2012-05-08 DIAGNOSIS — Z7901 Long term (current) use of anticoagulants: Secondary | ICD-10-CM | POA: Diagnosis not present

## 2012-05-08 DIAGNOSIS — E785 Hyperlipidemia, unspecified: Secondary | ICD-10-CM | POA: Diagnosis present

## 2012-05-08 DIAGNOSIS — I4891 Unspecified atrial fibrillation: Secondary | ICD-10-CM | POA: Diagnosis present

## 2012-05-08 DIAGNOSIS — K2940 Chronic atrophic gastritis without bleeding: Secondary | ICD-10-CM

## 2012-05-08 DIAGNOSIS — J40 Bronchitis, not specified as acute or chronic: Secondary | ICD-10-CM

## 2012-05-08 DIAGNOSIS — B3781 Candidal esophagitis: Secondary | ICD-10-CM

## 2012-05-08 DIAGNOSIS — R05 Cough: Secondary | ICD-10-CM | POA: Diagnosis not present

## 2012-05-08 DIAGNOSIS — K5900 Constipation, unspecified: Secondary | ICD-10-CM | POA: Diagnosis present

## 2012-05-08 DIAGNOSIS — D126 Benign neoplasm of colon, unspecified: Secondary | ICD-10-CM

## 2012-05-08 DIAGNOSIS — IMO0001 Reserved for inherently not codable concepts without codable children: Secondary | ICD-10-CM

## 2012-05-08 DIAGNOSIS — K449 Diaphragmatic hernia without obstruction or gangrene: Secondary | ICD-10-CM

## 2012-05-08 DIAGNOSIS — J9801 Acute bronchospasm: Secondary | ICD-10-CM

## 2012-05-08 DIAGNOSIS — I739 Peripheral vascular disease, unspecified: Secondary | ICD-10-CM | POA: Diagnosis present

## 2012-05-08 DIAGNOSIS — J4489 Other specified chronic obstructive pulmonary disease: Secondary | ICD-10-CM | POA: Diagnosis present

## 2012-05-08 DIAGNOSIS — M069 Rheumatoid arthritis, unspecified: Secondary | ICD-10-CM | POA: Diagnosis present

## 2012-05-08 DIAGNOSIS — J189 Pneumonia, unspecified organism: Secondary | ICD-10-CM | POA: Diagnosis present

## 2012-05-08 DIAGNOSIS — R51 Headache: Secondary | ICD-10-CM | POA: Diagnosis not present

## 2012-05-08 DIAGNOSIS — J449 Chronic obstructive pulmonary disease, unspecified: Secondary | ICD-10-CM | POA: Diagnosis present

## 2012-05-08 DIAGNOSIS — R918 Other nonspecific abnormal finding of lung field: Secondary | ICD-10-CM | POA: Diagnosis not present

## 2012-05-08 DIAGNOSIS — R509 Fever, unspecified: Secondary | ICD-10-CM | POA: Diagnosis not present

## 2012-05-08 DIAGNOSIS — K644 Residual hemorrhoidal skin tags: Secondary | ICD-10-CM

## 2012-05-08 DIAGNOSIS — I2510 Atherosclerotic heart disease of native coronary artery without angina pectoris: Secondary | ICD-10-CM | POA: Diagnosis present

## 2012-05-08 DIAGNOSIS — I82509 Chronic embolism and thrombosis of unspecified deep veins of unspecified lower extremity: Secondary | ICD-10-CM | POA: Diagnosis present

## 2012-05-08 DIAGNOSIS — K5730 Diverticulosis of large intestine without perforation or abscess without bleeding: Secondary | ICD-10-CM | POA: Diagnosis not present

## 2012-05-08 DIAGNOSIS — R059 Cough, unspecified: Secondary | ICD-10-CM | POA: Diagnosis not present

## 2012-05-08 DIAGNOSIS — R079 Chest pain, unspecified: Secondary | ICD-10-CM | POA: Diagnosis not present

## 2012-05-08 DIAGNOSIS — I509 Heart failure, unspecified: Secondary | ICD-10-CM

## 2012-05-08 DIAGNOSIS — I4819 Other persistent atrial fibrillation: Secondary | ICD-10-CM

## 2012-05-08 HISTORY — DX: Encounter for other specified aftercare: Z51.89

## 2012-05-08 HISTORY — DX: Pneumonia, unspecified organism: J18.9

## 2012-05-08 HISTORY — DX: Concussion without loss of consciousness, initial encounter: S06.0X0A

## 2012-05-08 HISTORY — DX: Personal history of other diseases of the digestive system: Z87.19

## 2012-05-08 HISTORY — DX: Rheumatoid arthritis, unspecified: M06.9

## 2012-05-08 HISTORY — DX: Acute embolism and thrombosis of unspecified deep veins of right lower extremity: I82.401

## 2012-05-08 HISTORY — DX: Malignant neoplasm of bladder, unspecified: C67.9

## 2012-05-08 HISTORY — DX: Pure hypercholesterolemia, unspecified: E78.00

## 2012-05-08 LAB — URINALYSIS, ROUTINE W REFLEX MICROSCOPIC
Bilirubin Urine: NEGATIVE
Nitrite: NEGATIVE
Protein, ur: NEGATIVE mg/dL
Urobilinogen, UA: 0.2 mg/dL (ref 0.0–1.0)

## 2012-05-08 LAB — CBC
HCT: 39.2 % (ref 36.0–46.0)
MCHC: 33.9 g/dL (ref 30.0–36.0)
Platelets: 232 10*3/uL (ref 150–400)
RDW: 13.2 % (ref 11.5–15.5)
WBC: 9.2 10*3/uL (ref 4.0–10.5)

## 2012-05-08 LAB — URINE MICROSCOPIC-ADD ON

## 2012-05-08 LAB — PRO B NATRIURETIC PEPTIDE: Pro B Natriuretic peptide (BNP): 1558 pg/mL — ABNORMAL HIGH (ref 0–450)

## 2012-05-08 LAB — BASIC METABOLIC PANEL
BUN: 8 mg/dL (ref 6–23)
GFR calc Af Amer: >90 mL/min (ref >90)
GFR calc non Af Amer: 82 mL/min — ABNORMAL LOW (ref >90)
Potassium: 3.9 mEq/L (ref 3.5–5.1)

## 2012-05-08 LAB — APTT: aPTT: 47 seconds — ABNORMAL HIGH (ref 24–37)

## 2012-05-08 MED ORDER — SODIUM CHLORIDE 0.9 % IV SOLN: 250.0000 mL | INTRAVENOUS | Status: DC | PRN

## 2012-05-08 MED ORDER — ACETAMINOPHEN 650 MG RE SUPP: 650.0000 mg | Freq: Four times a day (QID) | RECTAL | Status: DC | PRN

## 2012-05-08 MED ORDER — THERA M PLUS PO TABS: 1.0000 | ORAL_TABLET | Freq: Every day | ORAL | Status: DC

## 2012-05-08 MED ORDER — SODIUM CHLORIDE 0.9 % IV SOLN
INTRAVENOUS | Status: AC
  Administered 2012-05-08 (×2): via INTRAVENOUS

## 2012-05-08 MED ORDER — FESOTERODINE FUMARATE ER 4 MG PO TB24
4.0000 mg | ORAL_TABLET | Freq: Every day | ORAL | Status: DC
  Administered 2012-05-09 – 2012-05-11 (×3): 4 mg via ORAL
  Filled 2012-05-08 (×3): qty 1

## 2012-05-08 MED ORDER — WARFARIN - PHARMACIST DOSING INPATIENT: Freq: Every day | Status: DC

## 2012-05-08 MED ORDER — ACETAMINOPHEN 325 MG PO TABS
650.0000 mg | ORAL_TABLET | Freq: Four times a day (QID) | ORAL | Status: DC | PRN
  Administered 2012-05-09 – 2012-05-10 (×2): 650 mg via ORAL
  Filled 2012-05-08 (×2): qty 2

## 2012-05-08 MED ORDER — ACETAMINOPHEN 325 MG PO TABS
650.0000 mg | ORAL_TABLET | Freq: Once | ORAL | Status: AC
  Administered 2012-05-08: 650 mg via ORAL
  Filled 2012-05-08: qty 2

## 2012-05-08 MED ORDER — LEVOFLOXACIN IN D5W 500 MG/100ML IV SOLN
500.0000 mg | INTRAVENOUS | Status: AC
  Administered 2012-05-08: 500 mg via INTRAVENOUS
  Filled 2012-05-08: qty 100

## 2012-05-08 MED ORDER — DILTIAZEM HCL ER BEADS 120 MG PO CP24
120.0000 mg | ORAL_CAPSULE | Freq: Every day | ORAL | Status: DC
  Administered 2012-05-08: 120 mg via ORAL
  Filled 2012-05-08 (×2): qty 1

## 2012-05-08 MED ORDER — CALCIUM CARBONATE-VITAMIN D 250-125 MG-UNIT PO TABS: 1.0000 | ORAL_TABLET | Freq: Two times a day (BID) | ORAL | Status: DC

## 2012-05-08 MED ORDER — SODIUM CHLORIDE 0.9 % IJ SOLN
3.0000 mL | Freq: Two times a day (BID) | INTRAMUSCULAR | Status: DC
  Administered 2012-05-09 – 2012-05-11 (×5): 3 mL via INTRAVENOUS

## 2012-05-08 MED ORDER — ALBUTEROL SULFATE (5 MG/ML) 0.5% IN NEBU: 2.5000 mg | INHALATION_SOLUTION | RESPIRATORY_TRACT | Status: AC | PRN

## 2012-05-08 MED ORDER — IPRATROPIUM BROMIDE 0.02 % IN SOLN
0.5000 mg | Freq: Once | RESPIRATORY_TRACT | Status: AC
  Administered 2012-05-08: 0.5 mg via RESPIRATORY_TRACT
  Filled 2012-05-08: qty 2.5

## 2012-05-08 MED ORDER — ALBUTEROL SULFATE (5 MG/ML) 0.5% IN NEBU
5.0000 mg | INHALATION_SOLUTION | Freq: Once | RESPIRATORY_TRACT | Status: AC
  Administered 2012-05-08: 5 mg via RESPIRATORY_TRACT
  Filled 2012-05-08: qty 1

## 2012-05-08 MED ORDER — FUROSEMIDE 10 MG/ML IJ SOLN
40.0000 mg | Freq: Two times a day (BID) | INTRAMUSCULAR | Status: AC
  Administered 2012-05-09 (×2): 40 mg via INTRAVENOUS
  Filled 2012-05-08 (×3): qty 4

## 2012-05-08 MED ORDER — ALBUTEROL SULFATE HFA 108 (90 BASE) MCG/ACT IN AERS: 1.0000 | INHALATION_SPRAY | Freq: Four times a day (QID) | RESPIRATORY_TRACT | Status: DC | PRN

## 2012-05-08 MED ORDER — PANTOPRAZOLE SODIUM 40 MG PO TBEC
40.0000 mg | DELAYED_RELEASE_TABLET | Freq: Every day | ORAL | Status: DC
  Administered 2012-05-08 – 2012-05-11 (×4): 40 mg via ORAL
  Filled 2012-05-08 (×4): qty 1

## 2012-05-08 MED ORDER — PREDNISONE 10 MG PO TABS: 20.0000 mg | ORAL_TABLET | Freq: Every day | ORAL | Status: DC

## 2012-05-08 MED ORDER — SODIUM CHLORIDE 0.9 % IJ SOLN
3.0000 mL | Freq: Two times a day (BID) | INTRAMUSCULAR | Status: DC
  Administered 2012-05-10: 3 mL via INTRAVENOUS

## 2012-05-08 MED ORDER — LEVOFLOXACIN IN D5W 750 MG/150ML IV SOLN: 750.0000 mg | INTRAVENOUS | Status: DC

## 2012-05-08 MED ORDER — CALCIUM CARBONATE-VITAMIN D 500-200 MG-UNIT PO TABS
0.5000 | ORAL_TABLET | Freq: Every day | ORAL | Status: DC
  Administered 2012-05-09 – 2012-05-11 (×3): 0.5 via ORAL
  Filled 2012-05-08 (×3): qty 0.5

## 2012-05-08 MED ORDER — LEVOFLOXACIN IN D5W 500 MG/100ML IV SOLN: 500.0000 mg | INTRAVENOUS | Status: DC

## 2012-05-08 MED ORDER — POTASSIUM CHLORIDE CRYS ER 10 MEQ PO TBCR
10.0000 meq | EXTENDED_RELEASE_TABLET | Freq: Every day | ORAL | Status: DC
  Administered 2012-05-09 – 2012-05-11 (×3): 10 meq via ORAL
  Filled 2012-05-08 (×3): qty 1

## 2012-05-08 MED ORDER — WARFARIN SODIUM 2.5 MG PO TABS: 2.5000 mg | ORAL_TABLET | Freq: Once | ORAL | Status: DC

## 2012-05-08 MED ORDER — DILTIAZEM HCL ER BEADS 240 MG PO CP24
240.0000 mg | ORAL_CAPSULE | Freq: Every morning | ORAL | Status: DC
  Administered 2012-05-09 – 2012-05-11 (×3): 240 mg via ORAL
  Filled 2012-05-08 (×3): qty 1

## 2012-05-08 MED ORDER — SODIUM CHLORIDE 0.9 % IJ SOLN: 3.0000 mL | INTRAMUSCULAR | Status: DC | PRN

## 2012-05-08 MED ORDER — ADULT MULTIVITAMIN W/MINERALS CH
1.0000 | ORAL_TABLET | Freq: Every day | ORAL | Status: DC
  Administered 2012-05-09 – 2012-05-11 (×3): 1 via ORAL
  Filled 2012-05-08 (×3): qty 1

## 2012-05-08 MED ORDER — METOPROLOL SUCCINATE ER 50 MG PO TB24
50.0000 mg | ORAL_TABLET | Freq: Every day | ORAL | Status: DC
  Administered 2012-05-08 – 2012-05-10 (×3): 50 mg via ORAL
  Filled 2012-05-08 (×4): qty 1

## 2012-05-08 MED ORDER — LEVOFLOXACIN 500 MG PO TABS: 500.0000 mg | ORAL_TABLET | Freq: Every day | ORAL | Status: DC

## 2012-05-08 MED ORDER — WARFARIN SODIUM 2.5 MG PO TABS
2.5000 mg | ORAL_TABLET | Freq: Once | ORAL | Status: AC
  Administered 2012-05-08: 2.5 mg via ORAL
  Filled 2012-05-08 (×3): qty 1

## 2012-05-08 MED ORDER — ATORVASTATIN CALCIUM 10 MG PO TABS
10.0000 mg | ORAL_TABLET | Freq: Every day | ORAL | Status: DC
  Administered 2012-05-09 – 2012-05-10 (×2): 10 mg via ORAL
  Filled 2012-05-08 (×4): qty 1

## 2012-05-08 MED ORDER — METHYLPREDNISOLONE SODIUM SUCC 125 MG IJ SOLR
125.0000 mg | Freq: Once | INTRAMUSCULAR | Status: AC
  Administered 2012-05-08: 125 mg via INTRAVENOUS
  Filled 2012-05-08: qty 2

## 2012-05-08 MED ORDER — DOCUSATE SODIUM 100 MG PO CAPS
100.0000 mg | ORAL_CAPSULE | Freq: Two times a day (BID) | ORAL | Status: DC
  Administered 2012-05-08 – 2012-05-10 (×4): 100 mg via ORAL
  Filled 2012-05-08 (×4): qty 1

## 2012-05-08 NOTE — ED Notes (Signed)
Pt c/o productive cough with yellow sputum x 3 days with fever and generalized body aches; pt sts some cramping in hand and legs x several weeks

## 2012-05-08 NOTE — ED Notes (Signed)
Pt reports productive cough with yellow sputum since WEdnesday. Reports h/o pneumonia with similar symptoms. Also c/o right neck/shoulder pain since this morning. Pt resting with daughter at bedside on cardiac monitor

## 2012-05-08 NOTE — ED Notes (Signed)
Nurse unavailable for report at this time

## 2012-05-08 NOTE — Discharge Instructions (Signed)
Bronchospasm, Adult Bronchospasm means that there is a spasm or tightening of the airways going into the lungs. Because the airways go into a spasm and get smaller it makes breathing more difficult. For reasons not completely known, workings (functions) of the airways designed to protect the lungs become over active. This causes the airways to become more sensitive to:  Infection.   Weather.   Exercise.   Irritants.   Things that cause allergic reactions or allergies (allergens).  Frequent coughing or respiratory episodes should be checked for the cause. This condition may be made worse by exercise. CAUSES  Inflammation is often the cause of this condition. Allergy, viral respiratory infections, or irritants in the air often cause this problem. Allergic reactions produce immediate and delayed responses. Late reactions may produce more serious inflammation. This may lead to increased reactivity of the airways. Sometimes this is inherited. Some common triggers are:  Allergies.   Infection commonly triggers attacks. Antibiotics are not helpful for viral infections and usually do not help with attacks of bronchospasm.   Exercise (running, etc.) can trigger an attack. Proper pre-exercise medications help most individuals participate in sports. Swimming is the least likely sport to cause problems.   Irritants (for example, pollution, cigarette smoke, strong odors, aerosol sprays, paint fumes, etc.) may trigger attacks. You cannot smoke and do not allow smoking in your home. This is absolutely necessary. Show this instruction to mates, relatives and significant others that may not agree with you.   Weather changes may cause lung problems but moving around trying to find an ideal climate does not seem to be overly helpful. Winds increase molds and pollens in the air. Rain refreshes the air by washing irritants out. Cold air may cause irritation.   Emotional problems do not cause lung problems but  can trigger attacks.  SYMPTOMS  Wheezing is the most common symptom. Frequent coughing (with or without exercise and or crying) and repeated respiratory infections are all early warning signs of bronchospasm. Chest tightness and shortness of breath are other symptoms. DIAGNOSIS  Early hidden bronchospasm may go for long periods of time without being detected. This is especially true if wheezing cannot be detected by your caregiver. Lung (pulmonary) function studies may help with diagnosis in these cases. HOME CARE INSTRUCTIONS   It is necessary to remain calm during an attack. Try to relax and breathe more slowly. During this time medications may be given. If any breathing problems seem to be getting worse and are unresponsive to treatment seek immediate medical care.   If you have severe breathing difficulty or have had a life threatening attack it is probably a good idea for you to learn how to give adrenaline (epi-pen) or use an anaphylaxis kit. Your caregiver can help you with this. These are the same kits carried by people who have severe allergic reactions. This is especially important if you do not have readily accessible medical care.   With any severe breathing problems where epinephrine (adrenaline) has been given at home call 911 immediately as the delayed reaction may be even more severe.  SEEK MEDICAL CARE IF:   There is wheezing and shortness of breath, even if medications are given to prevent attacks.   An oral temperature above 102 F (38.9 C) develops.   There are muscle aches, chest pain, or thickening of sputum.   The sputum changes from clear or white to yellow, green, gray, or bloody.   There are problems that may be related to   the medicine you are given, such as a rash, itching, swelling, or trouble breathing.  SEEK IMMEDIATE MEDICAL CARE IF:   The usual medicines do not stop your wheezing, or there is increased coughing.   You have increased difficulty breathing.    MAKE SURE YOU:   Understand these instructions.   Will watch your condition.   Will get help right away if you are not doing well or get worse.  Document Released: 12/02/2003 Document Revised: 11/18/2011 Document Reviewed: 07/17/2008 [facility] Patient Information 2012 [place], [place].  Using Your Inhaler 1. Take the cap off the mouthpiece.  2. Shake the inhaler for 5 seconds.  3. Turn the inhaler so the bottle is above the mouthpiece. Hold it away from your mouth, at a distance of the width of 2 fingers.  4. Open your mouth widely, and tilt your head back slightly. Let your breath out.  5. Take a deep breath in slowly through your mouth. At the same time, push down on the bottle 1 time. You will feel the medicine enter your mouth and throat as you breathe.  6. Continue to take a deep breath in very slowly.  7. After you have breathed in completely, hold your breath for 10 seconds. This will help the medicine to settle in your lungs. If you cannot hold your breath for 10 seconds, hold it for as long as you can before you breathe out.  8. If your doctor has told you to take more than 1 puff, wait at least 1 minute between puffs. This will help you get the best results from your medicine.  9. If you use a steroid inhaler, rinse out your mouth after each dose.  10. Wash your inhaler once a day. Remove the bottle from the mouthpiece. Rinse the mouthpiece and cap with warm water. Dry everything well before you put the inhaler back together.  Document Released: 09/07/2008 Document Revised: 11/18/2011 Document Reviewed: 09/16/2009 [facility] Patient Information 2012 [place], [place].  Pneumonia, Adult Pneumonia is an infection of the lungs. It may be caused by a germ (virus or bacteria). Some types of pneumonia can spread easily from person to person. This can happen when you cough or sneeze. HOME CARE Only take medicine as told by your doctor.  Take your medicine (antibiotics) as told. Finish it  even if you start to feel better.  Do not smoke.  You may use a vaporizer or humidifier in your room. This can help loosen thick spit (mucus).  Sleep so you are almost sitting up (semi-upright). This helps reduce coughing.  Rest.  A shot (vaccine) can help prevent pneumonia. Shots are often advised for: People over 31 years old.  Patients on chemotherapy.  People with long-term (chronic) lung problems.  People with immune system problems.  GET HELP RIGHT AWAY IF:  You are getting worse.  You cannot control your cough, and you are losing sleep.  You cough up blood.  Your pain gets worse, even with medicine.  You have a fever.  Any of your problems are getting worse, not better.  You have shortness of breath or chest pain.  MAKE SURE YOU:  Understand these instructions.  Will watch your condition.  Will get help right away if you are not doing well or get worse.  Document Released: 05/17/2008 Document Revised: 11/18/2011 Document Reviewed: 02/19/2011 [facility] Patient Information 2012 [place], [place].

## 2012-05-08 NOTE — H&P (Signed)
[facility] History and Physical  DAWNIELLE CHRISTIANA NWG:[id number] DOB: [birth year] DOA: 05/08/2012  PCP: Juline Patch, MD, MD   Chief Complaint:  Chief Complaint  Patient presents with  . Cough  . Fever  . Generalized Body Aches     HPI:  78 yo woman with hx of afib, here with symptoms of dyspnea, cough and fever. Sputum is yellow green and she has had difficulties breathing at night. She also c/o weight gain and cramping.    Review of Systems:  Per HPI - all other systems reviewed and negative   Past Medical History  Diagnosis Date  . Coronary artery disease   . Hypertension   . GERD (gastroesophageal reflux disease)   . Headache   . High cholesterol   . Atrial fibrillation   . DVT (deep venous thrombosis), right 2005    "dry blood; after 8 foot fall"  . Pneumonia ~ 2010  . Chronic bronchitis     "get it basically 3 times/yr"  . Blood transfusion 1960  . H/O hiatal hernia   . Bladder cancer   . Rheumatoid arthritis   . Concussion w/o coma 03/31/2004    "even now has times when she's not able to comprehend" (05/08/12)   Past Surgical History  Procedure Date  . Back surgery 1976    tumor removed-benign  . Tonsillectomy 1952  . Transurethral resection of bladder tumor 11/11/2011    Procedure: TRANSURETHRAL RESECTION OF BLADDER TUMOR (TURBT);  Surgeon: Anner Crete;  Location: [facility];  Service: Urology;  Laterality: N/A;  Cysto, Bladder Biopsy, TURBT with Gyrus,   . Cystoscopy 11/11/2011    Procedure: CYSTOSCOPY;  Surgeon: Anner Crete;  Location: [facility];  Service: Urology;  Laterality: N/A;  . Bladder lift   . Bladder cancer 2010; 2009    "for tumors on surface of bladder"  . Breast cyst excision     2 cysts removed-left breast-benign  . Abdominal hysterectomy 1960    partial hysterectomy  . Knee arthroscopy 2005    right; S/P fall  . Cataract extraction w/ intraocular lens  implant, bilateral 1981  . Cardiac catheterization 1980's   Social History:   reports that she quit smoking about 29 years ago. Her smoking use included Cigarettes. She has a 50 pack-year smoking history. She does not have any smokeless tobacco history on file. She reports that she drinks about 3 ounces of alcohol per week. She reports that she does not use illicit drugs.  Allergies  Allergen Reactions  . Contrast Media (Iodinated Diagnostic Agents) Hives  . Penicillins Rash    "haven't had it in years"    Family History  Problem Relation Age of Onset  . Anesthesia problems Daughter     Prior to Admission medications   Medication Sig Start Date End Date Taking? Authorizing Provider  Calcium Carbonate-Vitamin D (CALCIUM + D PO) Take 1 tablet by mouth daily.    Yes Historical Provider, MD  CINNAMON PO Take 1 tablet by mouth daily.    Yes Historical Provider, MD  clonazePAM (KLONOPIN) 0.5 MG tablet Take 0.5 mg by mouth daily as needed. Anxiety    Yes Historical Provider, MD  diltiazem (TIAZAC) 120 MG 24 hr capsule Take 120 mg by mouth at bedtime.   Yes Historical Provider, MD  diltiazem (TIAZAC) 240 MG 24 hr capsule Take 240 mg by mouth every morning.   Yes Historical Provider, MD  diphenhydrAMINE (BENADRYL) 25 MG tablet Take 25 mg by mouth daily  as needed. For allergies   Yes Historical Provider, MD  fesoterodine (TOVIAZ) 4 MG TB24 Take 4 mg by mouth daily.   Yes Historical Provider, MD  fish oil-omega-3 fatty acids 1000 MG capsule Take 1 g by mouth 2 (two) times daily.    Yes Historical Provider, MD  furosemide (LASIX) 40 MG tablet Take 20 mg by mouth daily.    Yes Historical Provider, MD  metoprolol (TOPROL-XL) 50 MG 24 hr tablet Take 50 mg by mouth at bedtime.    Yes Historical Provider, MD  Multiple Vitamins-Minerals (MULTIVITAMINS THER. W/MINERALS) TABS Take 1 tablet by mouth daily.    Yes Historical Provider, MD  omeprazole (PRILOSEC) 20 MG capsule Take 20 mg by mouth daily.    Yes Historical Provider, MD  potassium chloride (KLOR-CON) 10 MEQ CR tablet Take 10  mEq by mouth every morning.    Yes Historical Provider, MD  rosuvastatin (CRESTOR) 5 MG tablet Take 5 mg by mouth at bedtime.    Yes Historical Provider, MD  warfarin (COUMADIN) 5 MG tablet Take 5 mg by mouth every evening. 11/11/11  Yes Anner Crete, MD  albuterol (PROVENTIL HFA;VENTOLIN HFA) 108 (90 BASE) MCG/ACT inhaler Inhale 1-2 puffs into the lungs every 6 (six) hours as needed for wheezing. 05/08/12 05/08/13  Loren Racer, MD  levofloxacin (LEVAQUIN) 500 MG tablet Take 1 tablet (500 mg total) by mouth daily. 05/08/12 05/18/12  Loren Racer, MD  predniSONE (DELTASONE) 10 MG tablet Take 2 tablets (20 mg total) by mouth daily. 05/08/12   Loren Racer, MD   Physical Exam: Filed Vitals:   05/08/12 1545 05/08/12 1600 05/08/12 1615 05/08/12 1630  BP: 126/62 128/71 123/77 134/69  Pulse: 98 117 89 91  Temp:      TempSrc:      Resp: 23 21 23 15   SpO2: 90% 94% 92% 94%     General:  Alert an doriented x3  Eyes: PERRLA, EOMI  ENT: clear   Neck: no JVD  Cardiovascular: irreg irreg , no murmurs  Respiratory: crackles right base   Abdomen: soft, Nt  Skin: old bruises   Musculoskeletal: changes of chronic arthritis  Psychiatric: euthymic , oriented x3  Neurologic: CN 2-12 intact, strength 5/5   Labs on Admission:  Basic Metabolic Panel:  Lab 05/08/12 0454  NA 135  K 3.9  CL 97  CO2 24  GLUCOSE 92  BUN 8  CREATININE 0.54  CALCIUM 8.9  MG --  PHOS --   Liver Function Tests: No results found for this basename: AST:5,ALT:5,ALKPHOS:5,BILITOT:5,PROT:5,ALBUMIN:5 in the last 168 hours No results found for this basename: LIPASE:5,AMYLASE:5 in the last 168 hours No results found for this basename: AMMONIA:5 in the last 168 hours CBC:  Lab 05/08/12 1241  WBC 9.2  NEUTROABS --  HGB 13.3  HCT 39.2  MCV 94.0  PLT 232   Cardiac Enzymes: No results found for this basename: CKTOTAL:5,CKMB:5,CKMBINDEX:5,TROPONINI:5 in the last 168 hours BNP: No components found with  this basename: POCBNP:5 CBG: No results found for this basename: GLUCAP:5 in the last 168 hours  Radiological Exams on Admission: Dg Chest 2 View  05/08/2012  *RADIOLOGY REPORT*  Clinical Data: Chest pain.  CHEST - 2 VIEW  Comparison: 11/08/2011  Findings: The patient has peribronchial thickening which is chronic.  Chronic prominent of the left pericardial fat pad. Pulmonary vascularity is normal.  Lungs are somewhat hyperinflated with flattening of the diaphragm consistent with emphysema.  On the lateral view there is a faint nodular  appearing density just behind the sternum which was not visible on multiple prior lateral views of the chest.  I suspect this is a confluence of normal structures but I cannot exclude a new pulmonary nodule.  CT scan of the chest without contrast would be useful for further evaluation.  IMPRESSION: Chronic lung disease.  Possible nodule seen on the lateral view. CT scan of the chest without contrast would be useful for further evaluation.  Original Report Authenticated By: Gwynn Burly, M.D.   Ct Head Wo Contrast  05/08/2012  *RADIOLOGY REPORT*  Clinical Data: Headache.  Malaise.  Fever.  CT HEAD WITHOUT CONTRAST  Technique:  Contiguous axial images were obtained from the base of the skull through the vertex without contrast.  Comparison: 06/10/2009  Findings: There is no evidence of intracranial hemorrhage, brain edema or other signs of acute infarction.  There is no evidence of intracranial mass lesion or mass effect.  No abnormal extra-axial fluid collections are identified.  Mild cerebral atrophy and chronic small vessel disease again noted. No evidence of hydrocephalus.  No skull abnormality identified.  IMPRESSION:  1.  No acute intracranial abnormality. 2.  Stable mild cerebral atrophy and chronic small vessel disease.  Original Report Authenticated By: Danae Orleans, M.D.   Ct Chest Wo Contrast  05/08/2012  *RADIOLOGY REPORT*  Clinical Data: Lung nodules seen on  chest radiograph.  Cough and fever.  CT CHEST WITHOUT CONTRAST  Technique:  Multidetector CT imaging of the chest was performed following the standard protocol without IV contrast.  Comparison: Chest radiograph on 05/08/2012  Findings: Scattered areas of pleural - parenchymal scarring are seen bilaterally, mainly in the lung bases. A focal area of airspace opacities seen in the lateral right lung base with diffuse central air bronchograms, and pneumonia cannot be excluded.  No suspicious pulmonary nodules masses are identified.  No evidence of pleural or pericardial effusion.  No evidence of hilar or mediastinal masses.  No other areas of lymphadenopathy identified.  Images of the upper abdomen show normal appearance of both adrenal glands.  Severe diverticulosis is seen involving the splenic flexure the colon.  No significant bone abnormality identified.  IMPRESSION:  1.  Focal area of airspace opacity with several air bronchograms in the lateral right lower lobe, suspicious for pneumonia. 2.  Scattered areas of pleural - parenchymal scarring, and probable COPD. 3.  No evidence of mass or lymphadenopathy.  Original Report Authenticated By: Danae Orleans, M.D.      Assessment/Plan Principal Problem:  *Pneumonia Active Problems:  DIVERTICULOSIS OF COLON  Coronary artery disease  Hypertension  GERD (gastroesophageal reflux disease)  Atrial fibrillation  DVT (deep venous thrombosis), right  Bladder cancer  Rheumatoid arthritis   1. RLL pneumonia - stable minimal oxygen requirement - plan for iv levaquin. Send sputum cx 2. Dyspnea worse at night ? chf due to afib - check echo - lasix iv  3.  Afib with RVr - resume rate controlling meds and consult cardiology  4.  Hx of bladder cancer - treated with BCG - now cancer free    Code Status: full Family Communication: daughter Disposition Plan: home  ,, MD  [facility] Pager [phone]  If 7PM-7AM, please contact  night-coverage [email or web address] Password [facility] 05/08/2012, 5:02 PM

## 2012-05-08 NOTE — ED Provider Notes (Signed)
History     CSN: [id number]  Arrival date & time 05/08/12  1107   First MD Initiated Contact with Patient 05/08/12 1327      Chief Complaint  Patient presents with  . Cough  . Fever  . Generalized Body Aches    (Consider location/radiation/quality/duration/timing/severity/associated sxs/prior treatment) HPI Pt has had 4 days of cough productive of yellow sputum and SOB. + low grade fever today. No chest pain. Pt had HA yesterday with questionable history of mild head trauma. She take coumadin for Afib. Past Medical History  Diagnosis Date  . Coronary artery disease   . Hypertension   . Dysrhythmia     Atrial fibrillation  . GERD (gastroesophageal reflux disease)   . Headache   . Arthritis     Past Surgical History  Procedure Date  . Abdominal hysterectomy 1960    partial hysterectomy  . Back surgery 1976    tumor removed-benign  . Breast surgery     2 cysts removed-left breast-benign  . Eye surgery 1981    bilateral cataract with lens implant  . Tonsillectomy 1952  . Transurethral resection of bladder tumor 11/11/2011    Procedure: TRANSURETHRAL RESECTION OF BLADDER TUMOR (TURBT);  Surgeon: Anner Crete;  Location: [facility];  Service: Urology;  Laterality: N/A;  Cysto, Bladder Biopsy, TURBT with Gyrus,   . Cystoscopy 11/11/2011    Procedure: CYSTOSCOPY;  Surgeon: Anner Crete;  Location: [facility];  Service: Urology;  Laterality: N/A;    Family History  Problem Relation Age of Onset  . Anesthesia problems Daughter     History  Substance Use Topics  . Smoking status: Former Smoker -- 1.5 packs/day for 50 years    Types: Cigarettes    Quit date: 11/07/1982  . Smokeless tobacco: Not on file  . Alcohol Use: 0.6 oz/week    1 Glasses of wine per week    OB History    Grav Para Term Preterm Abortions TAB SAB Ect Mult Living                  Review of Systems  Constitutional: Positive for fever and fatigue.  HENT: Negative for neck pain and neck stiffness.     Respiratory: Positive for cough, shortness of breath and wheezing.   Cardiovascular: Negative for chest pain, palpitations and leg swelling.  Gastrointestinal: Negative for nausea, vomiting and abdominal pain.  Genitourinary: Negative for dysuria and flank pain.  Skin: Negative for rash and wound.  Neurological: Negative for dizziness, weakness, light-headedness and numbness.    Allergies  Contrast media and Penicillins  Home Medications   Current Outpatient Rx  Name Route Sig Dispense Refill  . CALCIUM + D PO Oral Take 1 tablet by mouth daily.     Marland Kitchen CINNAMON PO Oral Take 1 tablet by mouth daily.     Marland Kitchen CLONAZEPAM 0.5 MG PO TABS Oral Take 0.5 mg by mouth daily as needed. Anxiety     . DILTIAZEM HCL ER BEADS 120 MG PO CP24 Oral Take 120 mg by mouth at bedtime.    Marland Kitchen DILTIAZEM HCL ER BEADS 240 MG PO CP24 Oral Take 240 mg by mouth every morning.    Marland Kitchen DIPHENHYDRAMINE HCL 25 MG PO TABS Oral Take 25 mg by mouth daily as needed. For allergies    . FESOTERODINE FUMARATE ER 4 MG PO TB24 Oral Take 4 mg by mouth daily.    . OMEGA-3 FATTY ACIDS 1000 MG PO CAPS Oral Take 1  g by mouth 2 (two) times daily.     . FUROSEMIDE 40 MG PO TABS Oral Take 20 mg by mouth daily.     Marland Kitchen METOPROLOL SUCCINATE ER 50 MG PO TB24 Oral Take 50 mg by mouth at bedtime.     Carma Leaven M PLUS PO TABS Oral Take 1 tablet by mouth daily.     Marland Kitchen OMEPRAZOLE 20 MG PO CPDR Oral Take 20 mg by mouth daily.     Marland Kitchen POTASSIUM CHLORIDE 10 MEQ PO TBCR Oral Take 10 mEq by mouth every morning.     Marland Kitchen ROSUVASTATIN CALCIUM 5 MG PO TABS Oral Take 5 mg by mouth at bedtime.     . WARFARIN SODIUM 5 MG PO TABS Oral Take 5 mg by mouth every evening.    . ALBUTEROL SULFATE HFA 108 (90 BASE) MCG/ACT IN AERS Inhalation Inhale 1-2 puffs into the lungs every 6 (six) hours as needed for wheezing. 1 Inhaler 0  . LEVOFLOXACIN 500 MG PO TABS Oral Take 1 tablet (500 mg total) by mouth daily. 10 tablet 0  . PREDNISONE 10 MG PO TABS Oral Take 2 tablets (20 mg  total) by mouth daily. 10 tablet 0    BP 119/64  Pulse 97  Temp(Src) 99.3 F (37.4 C) (Oral)  Resp 18  SpO2 96%  Physical Exam  Nursing note and vitals reviewed. Constitutional: She is oriented to person, place, and time. She appears well-developed and well-nourished. No distress.  HENT:  Head: Normocephalic and atraumatic.  Mouth/Throat: Oropharynx is clear and moist.  Eyes: EOM are normal.       irreg pupil on L due to prev cataract surgery   Neck: Normal range of motion. Neck supple.  Cardiovascular: Normal rate.        irreg irreg  Pulmonary/Chest: Effort normal. No respiratory distress. She has wheezes (Mild expiratory wheezing on L). She has no rales. She exhibits no tenderness.  Abdominal: Soft. Bowel sounds are normal. There is no tenderness. There is no rebound and no guarding.  Musculoskeletal: Normal range of motion. She exhibits no edema and no tenderness.  Neurological: She is alert and oriented to person, place, and time.       Moves all ext, sensation intact  Skin: Skin is warm and dry. No rash noted. No erythema.  Psychiatric: She has a normal mood and affect. Her behavior is normal.    ED Course  Procedures (including critical care time)  Labs Reviewed  BASIC METABOLIC PANEL - Abnormal; Notable for the following:    GFR calc non Af Amer 82 (*)    All other components within normal limits  PRO B NATRIURETIC PEPTIDE - Abnormal; Notable for the following:    Pro B Natriuretic peptide (BNP) 1558.0 (*)    All other components within normal limits  URINALYSIS, ROUTINE W REFLEX MICROSCOPIC - Abnormal; Notable for the following:    Hgb urine dipstick LARGE (*)    All other components within normal limits  PROTIME-INR - Abnormal; Notable for the following:    Prothrombin Time 24.8 (*)    INR 2.20 (*)    All other components within normal limits  APTT - Abnormal; Notable for the following:    aPTT 47 (*)    All other components within normal limits  CBC    URINE MICROSCOPIC-ADD ON   Dg Chest 2 View  05/08/2012  *RADIOLOGY REPORT*  Clinical Data: Chest pain.  CHEST - 2 VIEW  Comparison: 11/08/2011  Findings:  The patient has peribronchial thickening which is chronic.  Chronic prominent of the left pericardial fat pad. Pulmonary vascularity is normal.  Lungs are somewhat hyperinflated with flattening of the diaphragm consistent with emphysema.  On the lateral view there is a faint nodular appearing density just behind the sternum which was not visible on multiple prior lateral views of the chest.  I suspect this is a confluence of normal structures but I cannot exclude a new pulmonary nodule.  CT scan of the chest without contrast would be useful for further evaluation.  IMPRESSION: Chronic lung disease.  Possible nodule seen on the lateral view. CT scan of the chest without contrast would be useful for further evaluation.  Original Report Authenticated By: Gwynn Burly, M.D.   Ct Head Wo Contrast  05/08/2012  *RADIOLOGY REPORT*  Clinical Data: Headache.  Malaise.  Fever.  CT HEAD WITHOUT CONTRAST  Technique:  Contiguous axial images were obtained from the base of the skull through the vertex without contrast.  Comparison: 06/10/2009  Findings: There is no evidence of intracranial hemorrhage, brain edema or other signs of acute infarction.  There is no evidence of intracranial mass lesion or mass effect.  No abnormal extra-axial fluid collections are identified.  Mild cerebral atrophy and chronic small vessel disease again noted. No evidence of hydrocephalus.  No skull abnormality identified.  IMPRESSION:  1.  No acute intracranial abnormality. 2.  Stable mild cerebral atrophy and chronic small vessel disease.  Original Report Authenticated By: Danae Orleans, M.D.   Ct Chest Wo Contrast  05/08/2012  *RADIOLOGY REPORT*  Clinical Data: Lung nodules seen on chest radiograph.  Cough and fever.  CT CHEST WITHOUT CONTRAST  Technique:  Multidetector CT imaging of  the chest was performed following the standard protocol without IV contrast.  Comparison: Chest radiograph on 05/08/2012  Findings: Scattered areas of pleural - parenchymal scarring are seen bilaterally, mainly in the lung bases. A focal area of airspace opacities seen in the lateral right lung base with diffuse central air bronchograms, and pneumonia cannot be excluded.  No suspicious pulmonary nodules masses are identified.  No evidence of pleural or pericardial effusion.  No evidence of hilar or mediastinal masses.  No other areas of lymphadenopathy identified.  Images of the upper abdomen show normal appearance of both adrenal glands.  Severe diverticulosis is seen involving the splenic flexure the colon.  No significant bone abnormality identified.  IMPRESSION:  1.  Focal area of airspace opacity with several air bronchograms in the lateral right lower lobe, suspicious for pneumonia. 2.  Scattered areas of pleural - parenchymal scarring, and probable COPD. 3.  No evidence of mass or lymphadenopathy.  Original Report Authenticated By: Danae Orleans, M.D.     1. Community acquired pneumonia   2. Bronchospasm   3. Atrial fibrillation       MDM          Loren Racer, MD 05/08/12 [phone]

## 2012-05-08 NOTE — ED Notes (Signed)
Pt to CT with tech.

## 2012-05-08 NOTE — Progress Notes (Signed)
ANTICOAGULATION CONSULT NOTE - Initial Consult  Pharmacy Consult for warfarin  Indication: atrial fibrillation  Allergies  Allergen Reactions  . Contrast Media (Iodinated Diagnostic Agents) Hives  . Penicillins Rash    "haven't had it in years"    Patient Measurements:   Heparin Dosing Weight:   Vital Signs: Temp: 99.3 F (37.4 C) (05/27 1129) Temp src: Oral (05/27 1129) BP: 134/69 mmHg (05/27 1630) Pulse Rate: 91  (05/27 1630)  Labs:  Basename 05/08/12 1329 05/08/12 1241  HGB -- 13.3  HCT -- 39.2  PLT -- 232  APTT 47* --  LABPROT 24.8* --  INR 2.20* --  HEPARINUNFRC -- --  CREATININE -- 0.54  CKTOTAL -- --  CKMB -- --  TROPONINI -- --    The CrCl is unknown because both a height and weight (above a minimum accepted value) are required for this calculation.   Medical History: Past Medical History  Diagnosis Date  . Coronary artery disease   . Hypertension   . GERD (gastroesophageal reflux disease)   . Headache   . High cholesterol   . Atrial fibrillation   . DVT (deep venous thrombosis), right 2005    "dry blood; after 8 foot fall"  . Pneumonia ~ 2010  . Chronic bronchitis     "get it basically 3 times/yr"  . Blood transfusion 1960  . H/O hiatal hernia   . Bladder cancer   . Rheumatoid arthritis   . Concussion w/o coma 03/31/2004    "even now has times when she's not able to comprehend" (05/08/12)    Medications:  Home dose coumadin:  2.5mg  M/F, 5mg  all other days  Assessment: 63 yoF admitted for CAP, on chronic coumadin for Afib - ? Hx mild head trauma. INR 2.20, CBC ok. No bleeding noted.  Pt noted to have started levaquin which can cause elevations in INR - will f/u closely.    Goal of Therapy:  INR 2-3 Monitor platelets by anticoagulation protocol: Yes   Plan:  1.  Coumadin 2.5mg  po x1 tonight (home dose) 2.  F/u daily PT/INR, CBC, clinical course 3.  F/u LOT of levaquin ,  E 05/08/2012,6:05 PM

## 2012-05-09 DIAGNOSIS — I509 Heart failure, unspecified: Secondary | ICD-10-CM

## 2012-05-09 DIAGNOSIS — I4891 Unspecified atrial fibrillation: Secondary | ICD-10-CM

## 2012-05-09 DIAGNOSIS — D696 Thrombocytopenia, unspecified: Secondary | ICD-10-CM

## 2012-05-09 LAB — CBC
MCH: 31.4 pg (ref 26.0–34.0)
MCHC: 33.5 g/dL (ref 30.0–36.0)
RDW: 13.1 % (ref 11.5–15.5)

## 2012-05-09 LAB — BASIC METABOLIC PANEL
BUN: 12 mg/dL (ref 6–23)
Creatinine, Ser: 0.54 mg/dL (ref 0.50–1.10)
GFR calc Af Amer: >90 mL/min (ref >90)
GFR calc non Af Amer: 82 mL/min — ABNORMAL LOW (ref >90)
Glucose, Bld: 192 mg/dL — ABNORMAL HIGH (ref 70–99)

## 2012-05-09 LAB — EXPECTORATED SPUTUM ASSESSMENT W GRAM STAIN, RFLX TO RESP C

## 2012-05-09 LAB — PROTIME-INR: INR: 2.22 — ABNORMAL HIGH (ref 0.00–1.49)

## 2012-05-09 MED ORDER — IPRATROPIUM BROMIDE HFA 17 MCG/ACT IN AERS
2.0000 | INHALATION_SPRAY | Freq: Four times a day (QID) | RESPIRATORY_TRACT | Status: DC
  Administered 2012-05-09 – 2012-05-11 (×8): 2 via RESPIRATORY_TRACT
  Filled 2012-05-09: qty 12.9

## 2012-05-09 MED ORDER — DILTIAZEM HCL ER COATED BEADS 120 MG PO TB24
120.0000 mg | ORAL_TABLET | Freq: Every day | ORAL | Status: DC
  Administered 2012-05-09 – 2012-05-10 (×2): 120 mg via ORAL
  Filled 2012-05-09 (×3): qty 1

## 2012-05-09 MED ORDER — LEVOFLOXACIN 500 MG PO TABS: 500.0000 mg | ORAL_TABLET | Freq: Every day | ORAL | Status: DC

## 2012-05-09 MED ORDER — WARFARIN SODIUM 2.5 MG PO TABS: 2.5000 mg | ORAL_TABLET | ORAL | Status: DC

## 2012-05-09 MED ORDER — ALBUTEROL SULFATE HFA 108 (90 BASE) MCG/ACT IN AERS
2.0000 | INHALATION_SPRAY | Freq: Four times a day (QID) | RESPIRATORY_TRACT | Status: DC
  Administered 2012-05-09 – 2012-05-11 (×8): 2 via RESPIRATORY_TRACT
  Filled 2012-05-09: qty 6.7

## 2012-05-09 MED ORDER — LEVOFLOXACIN 250 MG PO TABS
250.0000 mg | ORAL_TABLET | ORAL | Status: DC
  Administered 2012-05-09: 250 mg via ORAL
  Filled 2012-05-09 (×2): qty 1

## 2012-05-09 MED ORDER — WARFARIN SODIUM 5 MG PO TABS
5.0000 mg | ORAL_TABLET | ORAL | Status: DC
  Administered 2012-05-09 – 2012-05-10 (×2): 5 mg via ORAL
  Filled 2012-05-09 (×5): qty 1

## 2012-05-09 MED ORDER — FUROSEMIDE 20 MG PO TABS
20.0000 mg | ORAL_TABLET | Freq: Two times a day (BID) | ORAL | Status: DC
  Administered 2012-05-10 – 2012-05-11 (×3): 20 mg via ORAL
  Filled 2012-05-09 (×5): qty 1

## 2012-05-09 NOTE — Progress Notes (Signed)
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 Possible nodule lateral view  CT scan chest 5/27 = focal airspace opacity with several air bronchograms and lateral right lower lobe suspicious for pneumonia-no mass or lymphadenopathy  CT head 5/27=no acute intracranial abnormality, mild cerebral atrophy  Antibiotics:  Levofloxacin 5/27   Subjective  Complains of abdominal pain. States pain is epigastric with some radiation of the lower abdomen. Denies any nausea vomiting diarrhea. States it is more of a full feeling. Chest and lower extremity edema and also some shoulder pain on the right side going into neck Cough all night but seems to be better now   Objective    Interim History: Chart reviewed in detail  Subjective:   Objective: Filed Vitals:   05/08/12 1815 05/08/12 1841 05/08/12 2132 05/09/12 0508  BP: 123/90 108/71 106/63 102/53  Pulse:  105 67 87  Temp:  98.1 F (36.7 C) 97.9 F (36.6 C) 98.4 F (36.9 C)  TempSrc:  Oral Oral Oral  Resp: 22 18 18 18   Height:  5\' 3"  (1.6 m)    Weight:  71.1 kg (156 lb 12 oz)  70.9 kg (156 lb 4.9 oz)  SpO2:  93% 92% 93%     Intake/Output Summary (Last 24 hours) at 05/09/12 8119 Last data filed at 05/08/12 1412  Gross per 24 hour  Intake      0 ml  Output    300 ml  Net   -300 ml    Exam:  General: Alert pleasant Caucasian female slightly hard of hearing Cardiovascular: S1-S2 no murmur rub or gallop. Nature fibrillation on monitor with no red alarms  Respiratory: Clinically clear with no added sound. No tactile vocal resonance and fremitus Abdomen: Soft nontender nondistended. Midline scar the abdomen. Some tenderness in the epigastric region Skin bilateral lower extremity pitting edema grade 1-2 Neuro intact  Data Reviewed: Basic Metabolic Panel:  Lab 05/09/12 1478 05/08/12 1241  NA 133* 135  K 3.9 3.9  CL 100 97  CO2 24 24  GLUCOSE 192* 92  BUN 12 8  CREATININE 0.54 0.54  CALCIUM 8.5 8.9  MG -- --  PHOS -- --   Liver Function Tests: No results found for this basename: AST:5,ALT:5,ALKPHOS:5,BILITOT:5,PROT:5,ALBUMIN:5 in the last 168 hours No results found for this basename: LIPASE:5,AMYLASE:5 in the last 168 hours No results found for this basename: AMMONIA:5 in the last 168 hours CBC:  Lab 05/09/12 0600 05/08/12 1241  WBC 9.5 9.2  NEUTROABS -- --  HGB 11.0* 13.3  HCT 32.8* 39.2  MCV 93.7 94.0  PLT 207 232   Cardiac Enzymes: No results found for this basename: CKTOTAL:5,CKMB:5,CKMBINDEX:5,TROPONINI:5 in the last 168 hours BNP: No components found with this basename: POCBNP:5 CBG: No results found for this basename: GLUCAP:5 in the last 168 hours  No  results found for this or any previous visit (from the past 240 hour(s)).   Studies:              All Imaging reviewed and is as per above notation   Scheduled Meds:   . sodium chloride   Intravenous STAT  . acetaminophen  650 mg Oral Once  . albuterol  5 mg Nebulization Once  . atorvastatin  10 mg Oral q1800  . calcium-vitamin D  0.5 tablet Oral Daily  . diltiazem  120 mg Oral QHS  . diltiazem  240 mg Oral q morning -  10a  . docusate sodium  100 mg Oral BID  . fesoterodine  4 mg Oral Daily  . furosemide  40 mg Intravenous BID  . ipratropium  0.5 mg Nebulization Once  . levofloxacin (LEVAQUIN) IV  500 mg Intravenous To Major  . levofloxacin (LEVAQUIN) IV  750 mg Intravenous Q48H  . methylPREDNISolone (SOLU-MEDROL) injection  125 mg Intravenous Once  . metoprolol succinate  50 mg Oral QHS  . mulitivitamin with minerals  1 tablet Oral Daily  . pantoprazole  40 mg Oral Q1200  . potassium chloride  10 mEq Oral Daily  . sodium chloride  3 mL Intravenous Q12H  . sodium chloride  3 mL Intravenous Q12H  . warfarin  2.5 mg Oral ONCE-1800  . Warfarin - Pharmacist Dosing Inpatient   Does not apply q1800  . DISCONTD: calcium-vitamin D  1 tablet Oral BID  . DISCONTD: levofloxacin (LEVAQUIN) IV  500 mg Intravenous Q24H  . DISCONTD: levofloxacin (LEVAQUIN) IV  750 mg Intravenous Q24H  . DISCONTD: multivitamins ther. w/minerals  1 tablet Oral Daily  . DISCONTD: warfarin  2.5 mg Oral ONCE-1800  . DISCONTD: Warfarin - Pharmacist Dosing Inpatient   Does not apply q1800   Continuous Infusions:    Assessment/Plan: 1. Community-acquired pneumonia-transitioned to by mouth levofloxacin from tomorrow. Get white count and repeat labs. 2. Abdominal pain-unlikely pancreatitis given able to tolerate by mouth with no nausea vomiting. We'll get lipase just to be sure. She has history of diverticulosis that might be the cause-would not image at present 3. A. Fibrillation-Chad score=3.  continue Tiazac 120 mg every afternoon &240 mg every morning-his blood pressure sustains below 90/50 he would cut back dose of Cardizem-continue Lasix 40 mg IV twice a day, metoprolol 50 mg XL every afternoon-INR therapeutic at 2.2. Continue Coumadin. 4. CHF with acute on chronic component-continue lasix 40 IV bid 5. Elevated blood sugar without diabetes diagnosis-get A1c in the morning-she will  6. Hyperlipidemia-continue Crestor 5 mg  daily 7. Reflux continue Prilosec 20 mg daily 8. History of transitional cell cancer-outpatient urology follow-up 9. History DVT in 2005-continue Coumadin. 10. Questionable history rheumatoid arthritis-stable-has seen no one for this recently 11. ?COPD-on CT scan of chest-continue Albuterol and Atrovent 12. Hld-Cont crestor  Code Status: Full-patient has a living will-wants compressions but doesn't wish to be in a vegetative state  Family Communication: Spoke with [name] [phone].  States she has chronic cramping in u and l extremities.  She will need outpatient Rheum and Card f/u Disposition Plan: inpatient   Pleas Koch, MD  [facility] Pager [phone] 05/09/2012, 8:32 AM    LOS: 1 day

## 2012-05-09 NOTE — Clinical Documentation Improvement (Signed)
CHF DOCUMENTATION CLARIFICATION QUERY  THIS DOCUMENT IS NOT A PERMANENT PART OF THE MEDICAL RECORD  TO RESPOND TO THE THIS QUERY, FOLLOW THE INSTRUCTIONS BELOW:  1. If needed, update documentation for the patient's encounter via the notes activity.  2. Access this query again and click edit on the In [employer].  3. After updating, or not, click F2 to complete all highlighted (required) fields concerning your review. Select "additional documentation in the medical record" OR "no additional documentation provided".  4. Click Sign note button.  5. The deficiency will fall out of your In Basket *Please let us know if you are not able to complete this workflow by phone or e-mail (listed below).  Please update your documentation within the medical record to reflect your response to this query.                                                                                    05/09/12  Dear Dr.Samtani/ Associates,  In a better effort to capture your patient's severity of illness, reflect appropriate length of stay and utilization of resources, a review of the patient medical record has revealed the following indicators the diagnosis of Heart Failure.    Based on your clinical judgment, please clarify and document in a progress note and/or discharge summary the clinical condition associated with the following supporting information:  In responding to this query please exercise your independent judgment.  The fact that a query is asked, does not imply that any particular answer is desired or expected.  Possible Clinical Conditions?  Chronic Systolic Congestive Heart Failure Chronic Diastolic Congestive Heart Failure Chronic Systolic & Diastolic Congestive Heart Failure Acute Systolic Congestive Heart Failure Acute Diastolic Congestive Heart Failure Acute Systolic & Diastolic Congestive Heart Failure Acute on Chronic Systolic Congestive Heart Failure Acute on Chronic Diastolic Congestive  Heart Failure Acute on Chronic Systolic & Diastolic  Congestive Heart Failure Other Condition Cannot Clinically Determine  Supporting Information:  Risk Factors: Questionable CHF due to A.fib noted per 5/27 progress notes.  Diagnostics: 5/27: proBNP: 1558.0 Treatment: 5/28:   Lasix 40mg  IV 2x/daily  Reviewed:  Per Cardiology consult does not appear to in CHF per progress notes.                             Thank You,  Marciano Sequin,  Clinical Documentation Specialist:  Pager: [phone]  Health Information Management [place]

## 2012-05-09 NOTE — Progress Notes (Signed)
PHARMACIST - PHYSICIAN COMMUNICATION DR:  Mahala Menghini  CONCERNING:  Levofloxacin 500mg  po q24 ordered in this patient with CrCl ~76ml/min.     DESCRIPTION: Recommended dosing for renal function is 500mg  po x 1 (pt received 5/27), then 250mg  po q24.  Have adjusted dosing.  Marisue Humble, PharmD Clinical Pharmacist [facility]

## 2012-05-09 NOTE — Progress Notes (Signed)
  Echocardiogram 2D Echocardiogram has been performed.  , Real Cons 05/09/2012, 6:28 PM

## 2012-05-09 NOTE — Consult Note (Signed)
Reason for Consult: Afib RVR Referring Physician: Mahala Menghini  HPI:   The patient is an 78 yo female with a history of CAD, HTN, GERD, AFIB(takes coumadin), HLD, Fall in 2005 with resulting DVT, bladder cancer, hiatal hernia, bronchitis.  She was seen at [facility] in May 2011 for a 2D echo only which revealed an EF >55%, normal LV size and wall thickness, LA was mildly dilated.  Mild AO regurgitation.  She also reports 40% right carotid and 70% left carotid stenoses.   She presented with SOB, productive cough.  She also reports dizziness, feeling like her "heart stops for a second", right LE edema, nausea, right and left lower quad pain, 2 pillow orthopnea but denies PND.  She also denies CP, vomiting, hematochezia, hematuria.  She is currently therapeutic with her INR.  She is being treated for PNA.    Past Medical History  Diagnosis Date  . Coronary artery disease   . Hypertension   . GERD (gastroesophageal reflux disease)   . Headache   . High cholesterol   . Atrial fibrillation   . DVT (deep venous thrombosis), right 2005    "dry blood; after 8 foot fall"  . Pneumonia ~ 2010  . Chronic bronchitis     "get it basically 3 times/yr"  . Blood transfusion 1960  . H/O hiatal hernia   . Bladder cancer   . Rheumatoid arthritis   . Concussion w/o coma 03/31/2004    "even now has times when she's not able to comprehend" (05/08/12)    Past Surgical History  Procedure Date  . Back surgery 1976    tumor removed-benign  . Tonsillectomy 1952  . Transurethral resection of bladder tumor 11/11/2011    Procedure: TRANSURETHRAL RESECTION OF BLADDER TUMOR (TURBT);  Surgeon: Anner Crete;  Location: [facility];  Service: Urology;  Laterality: N/A;  Cysto, Bladder Biopsy, TURBT with Gyrus,   . Cystoscopy 11/11/2011    Procedure: CYSTOSCOPY;  Surgeon: Anner Crete;  Location: [facility];  Service: Urology;  Laterality: N/A;  . Bladder lift   . Bladder cancer 2010; 2009    "for tumors on surface of bladder"  .  Breast cyst excision     2 cysts removed-left breast-benign  . Abdominal hysterectomy 1960    partial hysterectomy  . Knee arthroscopy 2005    right; S/P fall  . Cataract extraction w/ intraocular lens  implant, bilateral 1981  . Cardiac catheterization 1980's    Family History  Problem Relation Age of Onset  . Anesthesia problems Daughter     Social History:  reports that she quit smoking about 29 years ago. Her smoking use included Cigarettes. She has a 50 pack-year smoking history. She does not have any smokeless tobacco history on file. She reports that she drinks about 3 ounces of alcohol per week. She reports that she does not use illicit drugs.  Allergies:  Allergies  Allergen Reactions  . Contrast Media (Iodinated Diagnostic Agents) Hives  . Penicillins Rash    "haven't had it in years"    Medications:     . sodium chloride   Intravenous STAT  . acetaminophen  650 mg Oral Once  . albuterol  2 puff Inhalation Q6H  . albuterol  5 mg Nebulization Once  . atorvastatin  10 mg Oral q1800  . calcium-vitamin D  0.5 tablet Oral Daily  . diltiazem  120 mg Oral QHS  . diltiazem  240 mg Oral q morning - 10a  . docusate  sodium  100 mg Oral BID  . fesoterodine  4 mg Oral Daily  . furosemide  40 mg Intravenous BID  . ipratropium  2 puff Inhalation Q6H  . ipratropium  0.5 mg Nebulization Once  . levofloxacin (LEVAQUIN) IV  500 mg Intravenous To Major  . levofloxacin  250 mg Oral Q24H  . methylPREDNISolone (SOLU-MEDROL) injection  125 mg Intravenous Once  . metoprolol succinate  50 mg Oral QHS  . mulitivitamin with minerals  1 tablet Oral Daily  . pantoprazole  40 mg Oral Q1200  . potassium chloride  10 mEq Oral Daily  . sodium chloride  3 mL Intravenous Q12H  . sodium chloride  3 mL Intravenous Q12H  . warfarin  2.5 mg Oral ONCE-1800  . warfarin  2.5 mg Oral Custom  . warfarin  5 mg Oral Custom  . Warfarin - Pharmacist Dosing Inpatient   Does not apply q1800  . DISCONTD:  calcium-vitamin D  1 tablet Oral BID  . DISCONTD: levofloxacin (LEVAQUIN) IV  500 mg Intravenous Q24H  . DISCONTD: levofloxacin (LEVAQUIN) IV  750 mg Intravenous Q24H  . DISCONTD: levofloxacin (LEVAQUIN) IV  750 mg Intravenous Q48H  . DISCONTD: levofloxacin  500 mg Oral Daily  . DISCONTD: multivitamins ther. w/minerals  1 tablet Oral Daily  . DISCONTD: warfarin  2.5 mg Oral ONCE-1800  . DISCONTD: Warfarin - Pharmacist Dosing Inpatient   Does not apply q1800     Results for orders placed during the hospital encounter of 05/08/12 (from the past 48 hour(s))  URINALYSIS, ROUTINE W REFLEX MICROSCOPIC     Status: Abnormal   Collection Time   05/08/12 12:34 PM      Component Value Range Comment   Color, Urine YELLOW  YELLOW     APPearance CLEAR  CLEAR     Specific Gravity, Urine 1.006  1.005 - 1.030     pH 6.0  5.0 - 8.0     Glucose, UA NEGATIVE  NEGATIVE (mg/dL)    Hgb urine dipstick LARGE (*) NEGATIVE     Bilirubin Urine NEGATIVE  NEGATIVE     Ketones, ur NEGATIVE  NEGATIVE (mg/dL)    Protein, ur NEGATIVE  NEGATIVE (mg/dL)    Urobilinogen, UA 0.2  0.0 - 1.0 (mg/dL)    Nitrite NEGATIVE  NEGATIVE     Leukocytes, UA NEGATIVE  NEGATIVE    URINE MICROSCOPIC-ADD ON     Status: Normal   Collection Time   05/08/12 12:34 PM      Component Value Range Comment   Squamous Epithelial / LPF RARE  RARE     RBC / HPF 3-6  <3 (RBC/hpf)   CBC     Status: Normal   Collection Time   05/08/12 12:41 PM      Component Value Range Comment   WBC 9.2  4.0 - 10.5 (K/uL)    RBC 4.17  3.87 - 5.11 (MIL/uL)    Hemoglobin 13.3  12.0 - 15.0 (g/dL)    HCT 40.9  81.1 - 91.4 (%)    MCV 94.0  78.0 - 100.0 (fL)    MCH 31.9  26.0 - 34.0 (pg)    MCHC 33.9  30.0 - 36.0 (g/dL)    RDW 78.2  95.6 - 21.3 (%)    Platelets 232  150 - 400 (K/uL)   BASIC METABOLIC PANEL     Status: Abnormal   Collection Time   05/08/12 12:41 PM      Component Value Range Comment  Sodium 135  135 - 145 (mEq/L)    Potassium 3.9  3.5 - 5.1  (mEq/L)    Chloride 97  96 - 112 (mEq/L)    CO2 24  19 - 32 (mEq/L)    Glucose, Bld 92  70 - 99 (mg/dL)    BUN 8  6 - 23 (mg/dL)    Creatinine, Ser 2.13  0.50 - 1.10 (mg/dL)    Calcium 8.9  8.4 - 10.5 (mg/dL)    GFR calc non Af Amer 82 (*) >90 (mL/min)    GFR calc Af Amer >90  >90 (mL/min)   PRO B NATRIURETIC PEPTIDE     Status: Abnormal   Collection Time   05/08/12 12:48 PM      Component Value Range Comment   Pro B Natriuretic peptide (BNP) 1558.0 (*) 0 - 450 (pg/mL)   PROTIME-INR     Status: Abnormal   Collection Time   05/08/12  1:29 PM      Component Value Range Comment   Prothrombin Time 24.8 (*) 11.6 - 15.2 (seconds)    INR 2.20 (*) 0.00 - 1.49    APTT     Status: Abnormal   Collection Time   05/08/12  1:29 PM      Component Value Range Comment   aPTT 47 (*) 24 - 37 (seconds)   PROTIME-INR     Status: Abnormal   Collection Time   05/09/12  6:00 AM      Component Value Range Comment   Prothrombin Time 25.0 (*) 11.6 - 15.2 (seconds)    INR 2.22 (*) 0.00 - 1.49    BASIC METABOLIC PANEL     Status: Abnormal   Collection Time   05/09/12  6:00 AM      Component Value Range Comment   Sodium 133 (*) 135 - 145 (mEq/L)    Potassium 3.9  3.5 - 5.1 (mEq/L)    Chloride 100  96 - 112 (mEq/L)    CO2 24  19 - 32 (mEq/L)    Glucose, Bld 192 (*) 70 - 99 (mg/dL)    BUN 12  6 - 23 (mg/dL)    Creatinine, Ser 0.86  0.50 - 1.10 (mg/dL)    Calcium 8.5  8.4 - 10.5 (mg/dL)    GFR calc non Af Amer 82 (*) >90 (mL/min)    GFR calc Af Amer >90  >90 (mL/min)   CBC     Status: Abnormal   Collection Time   05/09/12  6:00 AM      Component Value Range Comment   WBC 9.5  4.0 - 10.5 (K/uL)    RBC 3.50 (*) 3.87 - 5.11 (MIL/uL)    Hemoglobin 11.0 (*) 12.0 - 15.0 (g/dL) DELTA CHECK NOTED   HCT 32.8 (*) 36.0 - 46.0 (%)    MCV 93.7  78.0 - 100.0 (fL)    MCH 31.4  26.0 - 34.0 (pg)    MCHC 33.5  30.0 - 36.0 (g/dL)    RDW 57.8  46.9 - 62.9 (%)    Platelets 207  150 - 400 (K/uL)     Dg Chest 2  View  05/08/2012  *RADIOLOGY REPORT*  Clinical Data: Chest pain.  CHEST - 2 VIEW  Comparison: 11/08/2011  Findings: The patient has peribronchial thickening which is chronic.  Chronic prominent of the left pericardial fat pad. Pulmonary vascularity is normal.  Lungs are somewhat hyperinflated with flattening of the diaphragm consistent with emphysema.  On the lateral view  there is a faint nodular appearing density just behind the sternum which was not visible on multiple prior lateral views of the chest.  I suspect this is a confluence of normal structures but I cannot exclude a new pulmonary nodule.  CT scan of the chest without contrast would be useful for further evaluation.  IMPRESSION: Chronic lung disease.  Possible nodule seen on the lateral view. CT scan of the chest without contrast would be useful for further evaluation.  Original Report Authenticated By: Gwynn Burly, M.D.   Ct Head Wo Contrast  05/08/2012  *RADIOLOGY REPORT*  Clinical Data: Headache.  Malaise.  Fever.  CT HEAD WITHOUT CONTRAST  Technique:  Contiguous axial images were obtained from the base of the skull through the vertex without contrast.  Comparison: 06/10/2009  Findings: There is no evidence of intracranial hemorrhage, brain edema or other signs of acute infarction.  There is no evidence of intracranial mass lesion or mass effect.  No abnormal extra-axial fluid collections are identified.  Mild cerebral atrophy and chronic small vessel disease again noted. No evidence of hydrocephalus.  No skull abnormality identified.  IMPRESSION:  1.  No acute intracranial abnormality. 2.  Stable mild cerebral atrophy and chronic small vessel disease.  Original Report Authenticated By: Danae Orleans, M.D.   Ct Chest Wo Contrast  05/08/2012  *RADIOLOGY REPORT*  Clinical Data: Lung nodules seen on chest radiograph.  Cough and fever.  CT CHEST WITHOUT CONTRAST  Technique:  Multidetector CT imaging of the chest was performed following the  standard protocol without IV contrast.  Comparison: Chest radiograph on 05/08/2012  Findings: Scattered areas of pleural - parenchymal scarring are seen bilaterally, mainly in the lung bases. A focal area of airspace opacities seen in the lateral right lung base with diffuse central air bronchograms, and pneumonia cannot be excluded.  No suspicious pulmonary nodules masses are identified.  No evidence of pleural or pericardial effusion.  No evidence of hilar or mediastinal masses.  No other areas of lymphadenopathy identified.  Images of the upper abdomen show normal appearance of both adrenal glands.  Severe diverticulosis is seen involving the splenic flexure the colon.  No significant bone abnormality identified.  IMPRESSION:  1.  Focal area of airspace opacity with several air bronchograms in the lateral right lower lobe, suspicious for pneumonia. 2.  Scattered areas of pleural - parenchymal scarring, and probable COPD. 3.  No evidence of mass or lymphadenopathy.  Original Report Authenticated By: Danae Orleans, M.D.    Review of Systems  Constitutional: Negative for fever and diaphoresis.  HENT: Positive for congestion and neck pain (right sided).   Eyes: Positive for blurred vision.  Respiratory: Positive for cough and shortness of breath.   Cardiovascular: Positive for palpitations, orthopnea and leg swelling (right lower extremity). Negative for chest pain.  Gastrointestinal: Positive for nausea, abdominal pain (on the right and left) and constipation. Negative for vomiting, diarrhea, blood in stool and melena.  Genitourinary: Positive for dysuria. Negative for hematuria.  Neurological: Positive for dizziness.   Blood pressure 102/53, pulse 87, temperature 98.4 F (36.9 C), temperature source Oral, resp. rate 18, height 5\' 3"  (1.6 m), weight 70.9 kg (156 lb 4.9 oz), SpO2 93.00%. Physical Exam  Constitutional: She is oriented to person, place, and time.       Overweight caucasian female.    HENT:  Head: Normocephalic and atraumatic.  Eyes: EOM are normal. Pupils are equal, round, and reactive to light. No scleral icterus.  Neck: Normal range of motion. Neck supple. No JVD present.  Cardiovascular: Normal rate.  An irregular rhythm present.  No murmur heard. Pulses:      Radial pulses are 2+ on the right side, and 2+ on the left side.       Dorsalis pedis pulses are 2+ on the right side, and 2+ on the left side.       No carotid or femoral bruits  Respiratory: Effort normal and breath sounds normal. She has no wheezes. She has no rales.  GI: Soft. Bowel sounds are normal. There is tenderness (Right and left lower quadrants).  Musculoskeletal: She exhibits no edema.       No LEE   Lymphadenopathy:    She has no cervical adenopathy.  Neurological: She is alert and oriented to person, place, and time. She exhibits normal muscle tone.  Skin: Skin is warm and dry.  Psychiatric: She has a normal mood and affect.    Assessment/Plan: Patient Active Hospital Problem List: Pneumonia () DIVERTICULOSIS OF COLON (01/26/2008) Coronary artery disease () Hypertension () GERD (gastroesophageal reflux disease) () Atrial fibrillation () DVT (deep venous thrombosis), right () Bladder cancer () Rheumatoid arthritis ()  Plan:  BNP is slightly elevated ~1500 with not overt signs of CHF but has received IV Lasix.  Recommend switching to PO.  May be related to Afib which is currently controlled in at 79BPM with PO diltiazem 240mg  QAM and 120mg  QHS (was on 120mg  po daily @ home) as well as 50mg  of PO lopressor BID.  Last Echo 04/2010 with normal EF.  New echo is pending.  She reports a history of carotid stenosis and will likely need follow up dopplers.  Unsure of when last study was down.  BP controlled 102/53 - 134/69.    We will look for echo when completed.  HAGER, BRYAN 05/09/2012, 9:54 AM   ATTENDING ATTESTATION:  I have seen and examined the patient along with Wilburt Finlay, PA.  I  have reviewed the chart, notes and new data.  I agree with Bryan's note.  Brief Description: Very pleasant 78 y/o woman with PMH notable for CAF - rate controlled on Warfarin (previously normal EF) who was admitted last PM with Wheezing, coughing & SOB -- noted to have elevated BNP & stable Afib. Mild orthopnea but no PND, no edema.   Key new complaints: Feeling better  Key examination changes: mild upper airway wheezing, no edema, irreg-irreg, no M/R/G; agree with remainder of Bryan's exam.  Key new findings / data: CXR & CT (personally reviewed) -- no real suggestion of pulmonary edema, suggestion of likely PNA  PLAN:  Afib - rate pretty much controlled -- need to confirm her home med dose -- she says her Diltiazem dose was changed.    INR is therapeutic - will need close monitoring with Abx for PNA.  Will change Furosemide to PO for tomorrow after IV doses today -- cxr not suggesting Pulm edema & no peripheral edema.  We will follow along, but do not anticipate making significant changes. I will happily see her in f/u as she does not have a primary cardiologist since Dr. Guadlupe Spanish' retirement.  Marykay Lex, M.D., M.S. [facility] [address] [place], [place]  [address]  [phone]  05/09/2012 2:48 PM

## 2012-05-09 NOTE — Progress Notes (Signed)
ANTICOAGULATION CONSULT NOTE - Follow Up Consult  Pharmacy Consult for Coumadin Indication: atrial fibrillation  Allergies  Allergen Reactions  . Contrast Media (Iodinated Diagnostic Agents) Hives  . Penicillins Rash    "haven't had it in years"    Patient Measurements: Height: 5\' 3"  (160 cm) Weight: 156 lb 4.9 oz (70.9 kg) IBW/kg (Calculated) : 52.4  Heparin Dosing Weight:   Vital Signs: Temp: 98.4 F (36.9 C) (05/28 0508) Temp src: Oral (05/28 0508) BP: 102/53 mmHg (05/28 0508) Pulse Rate: 87  (05/28 0508)  Labs:  Basename 05/09/12 0600 05/08/12 1329 05/08/12 1241  HGB 11.0* -- 13.3  HCT 32.8* -- 39.2  PLT 207 -- 232  APTT -- 47* --  LABPROT 25.0* 24.8* --  INR 2.22* 2.20* --  HEPARINUNFRC -- -- --  CREATININE 0.54 -- 0.54  CKTOTAL -- -- --  CKMB -- -- --  TROPONINI -- -- --    Estimated Creatinine Clearance: 45.9 ml/min (by C-G formula based on Cr of 0.54).  Assessment: 89yof on chronic Coumadin for Afib. INR (2.22) remains therapeutic on home regimen (5mg  daily except 2.5mg  on Mon/Fri). - H/H and Plts trended down - No significant bleeding reported  Goal of Therapy:  INR 2-3 Monitor platelets by anticoagulation protocol: Yes   Plan:  1. Continue patient's home Coumadin regimen - 5mg  due today 2. Follow-up AM INR   Cleon Dew [phone] 05/09/2012,9:01 AM     .

## 2012-05-09 NOTE — Care Management Note (Unsigned)
    Page 1 of 1   05/09/2012     11:45:54 AM   CARE MANAGEMENT NOTE 05/09/2012  Patient:  [name], [name]   Account Number:  [id number]  Date Initiated:  05/09/2012  Documentation initiated by:  SIMMONS,  Subjective/Objective Assessment:   ADMITTED WITH PNA; LIVES AT HOME WITH DAUGHTER; AMBULATES WITH CANE AT HOME.     Action/Plan:   DISCHARGE PLANNING INITIATED.   Anticipated DC Date:  05/11/2012   Anticipated DC Plan:  HOME/SELF CARE      DC Planning Services  CM consult      Choice offered to / List presented to:             Status of service:  In process, will continue to follow Medicare Important Message given?   (If response is "NO", the following Medicare IM given date fields will be blank) Date Medicare IM given:   Date Additional Medicare IM given:    Discharge Disposition:    Per UR Regulation:    If discussed at Long Length of Stay Meetings, dates discussed:    Comments:  05/09/12  1145   SIMMONS RN, BSN [phone] NCM WILL FOLLOW.

## 2012-05-10 DIAGNOSIS — D696 Thrombocytopenia, unspecified: Secondary | ICD-10-CM

## 2012-05-10 DIAGNOSIS — I4891 Unspecified atrial fibrillation: Secondary | ICD-10-CM

## 2012-05-10 DIAGNOSIS — E785 Hyperlipidemia, unspecified: Secondary | ICD-10-CM | POA: Diagnosis present

## 2012-05-10 DIAGNOSIS — I739 Peripheral vascular disease, unspecified: Secondary | ICD-10-CM | POA: Diagnosis present

## 2012-05-10 DIAGNOSIS — Z7901 Long term (current) use of anticoagulants: Secondary | ICD-10-CM

## 2012-05-10 DIAGNOSIS — I509 Heart failure, unspecified: Secondary | ICD-10-CM

## 2012-05-10 DIAGNOSIS — IMO0001 Reserved for inherently not codable concepts without codable children: Secondary | ICD-10-CM

## 2012-05-10 DIAGNOSIS — R7989 Other specified abnormal findings of blood chemistry: Secondary | ICD-10-CM | POA: Diagnosis present

## 2012-05-10 LAB — CBC
HCT: 35.4 % — ABNORMAL LOW (ref 36.0–46.0)
Hemoglobin: 12 g/dL (ref 12.0–15.0)
MCH: 32.3 pg (ref 26.0–34.0)
MCHC: 33.9 g/dL (ref 30.0–36.0)
RBC: 3.72 MIL/uL — ABNORMAL LOW (ref 3.87–5.11)

## 2012-05-10 LAB — DIFFERENTIAL
Basophils Relative: 0 % (ref 0–1)
Eosinophils Absolute: 0.4 10*3/uL (ref 0.0–0.7)
Eosinophils Relative: 4 % (ref 0–5)
Lymphocytes Relative: 19 % (ref 12–46)
Neutro Abs: 7.3 10*3/uL (ref 1.7–7.7)
Neutrophils Relative %: 65 % (ref 43–77)

## 2012-05-10 LAB — LIPASE, BLOOD: Lipase: 36 U/L (ref 11–59)

## 2012-05-10 LAB — PROTIME-INR: INR: 1.95 — ABNORMAL HIGH (ref 0.00–1.49)

## 2012-05-10 MED ORDER — LEVOFLOXACIN 750 MG PO TABS
750.0000 mg | ORAL_TABLET | ORAL | Status: DC
  Administered 2012-05-10: 750 mg via ORAL
  Filled 2012-05-10: qty 1

## 2012-05-10 MED ORDER — LEVOFLOXACIN 500 MG PO TABS
500.0000 mg | ORAL_TABLET | ORAL | Status: DC
  Filled 2012-05-10: qty 1

## 2012-05-10 MED ORDER — SENNOSIDES-DOCUSATE SODIUM 8.6-50 MG PO TABS
2.0000 | ORAL_TABLET | Freq: Two times a day (BID) | ORAL | Status: DC
  Administered 2012-05-10 – 2012-05-11 (×2): 2 via ORAL
  Filled 2012-05-10 (×6): qty 2

## 2012-05-10 MED ORDER — ZOLPIDEM TARTRATE 5 MG PO TABS: 5.0000 mg | ORAL_TABLET | Freq: Every evening | ORAL | Status: DC | PRN

## 2012-05-10 NOTE — Progress Notes (Signed)
Pt had a 2.17 second pause, pt VS stable, pt asymptomatic. Will continue to monitor.

## 2012-05-10 NOTE — Evaluation (Signed)
Physical Therapy Evaluation Patient Details Name: [name] MRN: [id number] DOB: 12-30-1920 Today's Date: 05/10/2012 Time: 4098-1191 PT Time Calculation (min): 15 min  PT Assessment / Plan / Recommendation Clinical Impression  Patient s/p PNA with decr mobility secondary to decr balance.  Patient reports fallling a lot at home.  Recommend outpt PT f/u at d/c and use of RW at all times.  Patient agrees.      PT Assessment  Patient needs continued PT services    Follow Up Recommendations  Outpatient PT;Supervision/Assistance - 24 hour    Barriers to Discharge  None      lEquipment Recommendations  None recommended by PT    Recommendations for Other Services   None  Frequency Min 3X/week    Precautions / Restrictions Precautions Precautions: Fall Restrictions Weight Bearing Restrictions: No   Pertinent Vitals/Pain VSS/ No pain      Mobility  Bed Mobility Bed Mobility: Rolling Right;Right Sidelying to Sit Rolling Right: 7: Independent Right Sidelying to Sit: 7: Independent Transfers Transfers: Sit to Stand;Stand to Sit Sit to Stand: 4: Min guard;With upper extremity assist;From bed Stand to Sit: 4: Min guard;To bed;With upper extremity assist Details for Transfer Assistance: steadying assist needed Ambulation/Gait Ambulation/Gait Assistance: 4: Min guard Ambulation Distance (Feet): 45 Feet Assistive device: Rolling walker Ambulation/Gait Assistance Details: Patient needs occasional steadying assist with RW Gait Pattern: Step-through pattern;Decreased stride length Gait velocity: Slow cadence Stairs: No Wheelchair Mobility Wheelchair Mobility: No         PT Diagnosis: Generalized weakness  PT Problem List: Decreased activity tolerance;Decreased balance;Decreased mobility;Decreased safety awareness PT Treatment Interventions: DME instruction;Gait training;Functional mobility training;Therapeutic activities;Therapeutic exercise;Balance  training;Patient/family education   PT Goals Acute Rehab PT Goals PT Goal Formulation: With patient Time For Goal Achievement: 05/24/12 Potential to Achieve Goals: Good Pt will go Supine/Side to Sit: Independently PT Goal: Supine/Side to Sit - Progress: Goal set today Pt will go Sit to Stand: with supervision PT Goal: Sit to Stand - Progress: Goal set today Pt will Transfer Bed to Chair/Chair to Bed: with supervision PT Transfer Goal: Bed to Chair/Chair to Bed - Progress: Goal set today Pt will Ambulate: with supervision;with least restrictive assistive device;51 - 150 feet PT Goal: Ambulate - Progress: Goal set today Additional Goals Additional Goal #1: Patient to complete Berg test with score > 38/56.   PT Goal: Additional Goal #1 - Progress: Goal set today  Visit Information  Last PT Received On: 05/10/12 Assistance Needed: +1    Subjective Data  Subjective: "I fall alot." Patient Stated Goal: To go home   Prior Functioning  Home Living Lives With: Daughter Available Help at Discharge: Family;Available 24 hours/day Type of Home: Mobile home Home Access: Ramped entrance Home Layout: One level Bathroom Shower/Tub: Engineer, manufacturing systems: Standard Home Adaptive Equipment: Walker - rolling;Shower chair with back;Bedside commode/3-in-1;Straight cane Prior Function Level of Independence: Independent with assistive device(s) (used cane PTA) Driving: Yes Vocation: Retired Musician: No difficulties    Cognition  Overall Cognitive Status: Appears within functional limits for tasks assessed/performed Arousal/Alertness: Awake/alert Orientation Level: Appears intact for tasks assessed Behavior During Session: [facility] for tasks performed    Extremity/Trunk Assessment Right Upper Extremity Assessment RUE ROM/Strength/Tone: Within functional levels RUE Sensation: WFL - Light Touch RUE Coordination: WFL - gross/fine motor Left Upper Extremity  Assessment LUE ROM/Strength/Tone: Within functional levels LUE Sensation: WFL - Light Touch LUE Coordination: WFL - gross/fine motor Right Lower Extremity Assessment RLE ROM/Strength/Tone: Within functional levels RLE Sensation:  WFL - Light Touch RLE Coordination: WFL - gross/fine motor Left Lower Extremity Assessment LLE ROM/Strength/Tone: Within functional levels LLE Sensation: WFL - Light Touch LLE Coordination: WFL - gross/fine motor Trunk Assessment Trunk Assessment: Normal   Balance Balance Balance Assessed: Yes Static Standing Balance Static Standing - Balance Support: Bilateral upper extremity supported;During functional activity Static Standing - Level of Assistance: 4: Min assist Static Standing - Comment/# of Minutes: 3  End of Session PT - End of Session Equipment Utilized During Treatment: Gait belt Activity Tolerance: Patient tolerated treatment well Patient left: in bed;with call bell/phone within reach Nurse Communication: Mobility status   INGOLD, 05/10/2012, 1:36 PM  [facility] [phone] (pager)

## 2012-05-10 NOTE — Progress Notes (Signed)
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 Possible nodule lateral view  CT scan chest 5/27 = focal airspace opacity with several air bronchograms and lateral right lower lobe suspicious for pneumonia-no mass or lymphadenopathy  CT head 5/27=no acute intracranial abnormality, mild cerebral atrophy  Antibiotics:  Levofloxacin 5/27   Subjective  Abdominal pain somewhat better.  Constipated. No SOB, N,V, CP Producing some thick discoloured mucous   Objective    Interim History: Chart reviewed in detail Cardiology notes reviewed  Subjective:   Objective: Filed Vitals:   05/09/12 2153 05/10/12 0306 05/10/12 0312 05/10/12 0845  BP:   109/70   Pulse:   87   Temp:   98.3 F (36.8 C)   TempSrc:   Oral   Resp:   18   Height:      Weight:   72.122 kg (159 lb)   SpO2: 95% 94% 94% 96%    Intake/Output Summary (Last 24 hours) at 05/10/12 0935 Last data filed at 05/10/12 0800  Gross per 24 hour  Intake    480 ml  Output      0 ml  Net    480 ml    Exam:  General: Alert pleasant Caucasian female slightly hard of hearing Cardiovascular: S1-S2 no murmur rub or  gallop. Atrial fibrillation on monitor with no red alarms  Respiratory: Clinically clear with no added sound. No tactile vocal resonance and fremitus Abdomen: Soft nontender nondistended. Midline scar the abdomen. Non-tendern epigastric region Skin bilateral lower extremity pitting edema grade 1-2 Neuro intact  Data Reviewed: Basic Metabolic Panel:  Lab 05/09/12 8119 05/08/12 1241  NA 133* 135  K 3.9 3.9  CL 100 97  CO2 24 24  GLUCOSE 192* 92  BUN 12 8  CREATININE 0.54 0.54  CALCIUM 8.5 8.9  MG -- --  PHOS -- --   Liver Function Tests: No results found for this basename: AST:5,ALT:5,ALKPHOS:5,BILITOT:5,PROT:5,ALBUMIN:5 in the last 168 hours  Lab 05/10/12 0450  LIPASE 36  AMYLASE --   No results found for this basename: AMMONIA:5 in the last 168 hours CBC:  Lab 05/10/12 0450 05/09/12 0600 05/08/12 1241  WBC 11.1* 9.5 9.2  NEUTROABS 7.3 -- --  HGB 12.0 11.0* 13.3  HCT 35.4* 32.8* 39.2  MCV 95.2 93.7 94.0  PLT 238 207 232   Cardiac Enzymes: No results found for this basename: CKTOTAL:5,CKMB:5,CKMBINDEX:5,TROPONINI:5 in the last 168 hours BNP: No components found with this basename: POCBNP:5 CBG: No results found for this basename: GLUCAP:5 in the last 168 hours  Recent Results (from the past 240 hour(s))  CULTURE, EXPECTORATED SPUTUM-ASSESSMENT     Status: Normal   Collection Time   05/09/12  1:16 PM      Component Value Range Status Comment   Specimen Description SPUTUM   Final  Special Requests NONE   Final    Sputum evaluation     Final    Value: MICROSCOPIC FINDINGS SUGGEST THAT THIS SPECIMEN IS NOT REPRESENTATIVE OF LOWER RESPIRATORY SECRETIONS. PLEASE RECOLLECT.     Gram Stain Report Called to,Read Back By and Verified With: JULIE BROWN,RN AT 1353 05/09/12 BY K BARR   Report Status 05/09/2012 FINAL   Final      Studies:              All Imaging reviewed and is as per above notation   Scheduled Meds:    . albuterol  2 puff Inhalation Q6H  .  atorvastatin  10 mg Oral q1800  . calcium-vitamin D  0.5 tablet Oral Daily  . diltiazem  120 mg Oral QHS  . diltiazem  240 mg Oral q morning - 10a  . docusate sodium  100 mg Oral BID  . fesoterodine  4 mg Oral Daily  . furosemide  40 mg Intravenous BID  . furosemide  20 mg Oral BID  . ipratropium  2 puff Inhalation Q6H  . levofloxacin  750 mg Oral Q48H  . metoprolol succinate  50 mg Oral QHS  . mulitivitamin with minerals  1 tablet Oral Daily  . pantoprazole  40 mg Oral Q1200  . potassium chloride  10 mEq Oral Daily  . sodium chloride  3 mL Intravenous Q12H  . sodium chloride  3 mL Intravenous Q12H  . warfarin  2.5 mg Oral Custom  . warfarin  5 mg Oral Custom  . Warfarin - Pharmacist Dosing Inpatient   Does not apply q1800  . DISCONTD: diltiazem  120 mg Oral QHS  . DISCONTD: levofloxacin  250 mg Oral Q24H  . DISCONTD: levofloxacin  500 mg Oral Q24H   Continuous Infusions:    Assessment/Plan: 1. Community-acquired pneumonia-transitioned to by mouth levofloxacin from tomorrow. White count slightly elevated-dose Levoflox 500 mg q 24 hrs and reassess-Sputum cultures not representative, therefore will not track 2. Abdominal pain-unlikely pancreatitis given able to tolerate by mouth with no nausea vomiting.I think she has predominant Constipation and will add a laxative 3. A. Fibrillation-Chad score=3.  continue Tiazac 120 mg every afternoon &240 mg every morning-his blood pressure sustains below 90/50 he would cut back dose of Cardizem-continue Lasix changed to 20 mg bid, metoprolol 50 mg XL every afternoon-INR therapeutic at 2.2. Continue Coumadin. 4. CHF with acute on chronic component-continue lasix as above 5. Elevated blood sugar without diabetes diagnosis-likely 2/2 to steroids. 6. Hyperlipidemia-continue Crestor 5 mg daily 7. Reflux continue Prilosec 20 mg daily 8. History of transitional cell cancer-outpatient urology follow-up 9. History DVT in 2005-continue Coumadin-Inr  slightly sub tx today 10. Questionable history rheumatoid arthritis-stable-has seen no one for this recently 11. ?COPD-on CT scan of chest-continue Albuterol and Atrovent 12. Hld-Cont crestor  Code Status: Full-patient has a living will-wants compressions but doesn't wish to be in a vegetative state  Family Communication: Spoke with Mariane Masters [phone]-updated. Discussed likey d/c home in 1-2 days Disposition Plan: inpatient   Pleas Koch, MD  [facility] Pager [phone] 05/10/2012, 9:35 AM    LOS: 2 days

## 2012-05-10 NOTE — Progress Notes (Deleted)
RN contacted [facility] concerning patients concern that patient's established cardiologist has not rounded on patient upon hospital admission. Per Rn in surgery returning floor Rn's page, Dr.Varanassi on-call for Dr.Turner. MD made aware of patient's concerns, MD to follow up with patient, Md currently in process of finishing up a procedure. Md to see patient. No time specified. Patient Advocacy rep in pt's room to address concerns.    ,RN,BSN   

## 2012-05-10 NOTE — Progress Notes (Signed)
Subjective:  SOB improving, she still looks weak.  Objective:  Vital Signs in the last 24 hours: Temp:  [97.1 F (36.2 C)-98.3 F (36.8 C)] 98.3 F (36.8 C) (05/29 0312) Pulse Rate:  [69-89] 87  (05/29 0312) Resp:  [18] 18  (05/29 0312) BP: (109-149)/(68-76) 109/70 mmHg (05/29 0312) SpO2:  [94 %-98 %] 96 % (05/29 0845) Weight:  [72.122 kg (159 lb)] 72.122 kg (159 lb) (05/29 0312)  Intake/Output from previous day:  Intake/Output Summary (Last 24 hours) at 05/10/12 0850 Last data filed at 05/10/12 0800  Gross per 24 hour  Intake    480 ml  Output      0 ml  Net    480 ml    Physical Exam: General appearance: alert, cooperative, no distress and frail Lungs: no rales or wheezing Heart: irregularly irregular rhythm   Rate: 88  Rhythm: atrial fibrillation  Lab Results:  Basename 05/10/12 0450 05/09/12 0600  WBC 11.1* 9.5  HGB 12.0 11.0*  PLT 238 207    Basename 05/09/12 0600 05/08/12 1241  NA 133* 135  K 3.9 3.9  CL 100 97  CO2 24 24  GLUCOSE 192* 92  BUN 12 8  CREATININE 0.54 0.54   No results found for this basename: TROPONINI:2,CK,MB:2 in the last 72 hours Hepatic Function Panel No results found for this basename: PROT,ALBUMIN,AST,ALT,ALKPHOS,BILITOT,BILIDIR,IBILI in the last 72 hours No results found for this basename: CHOL in the last 72 hours  Basename 05/10/12 0450  INR 1.95*    Imaging: Imaging results have been reviewed  Cardiac Studies:  Assessment/Plan:   Principal Problem:  *Pneumonia Active Problems:  Elevated brain natriuretic peptide (BNP) level- 1558 on admission  Chronic anticoagulation  Atrial fibrillation, persistent  Normal left ventricular systolic function, 2D 5/11  Hypertension  GERD (gastroesophageal reflux disease)  DVT (deep venous thrombosis), right  Bladder cancer  Rheumatoid arthritis  Normal coronary angiogram 1999 after an abnormal stress test  PVD, moderate bilat carotid disease   Plan- admitted with presumed  CAP, not sure how to explain BNP elevation. She does not look like she is in CHF on exam today. She has been placed on low dose Lasix. Will check 12 lead.  Corine Shelter PA-C 05/10/2012, 8:50 AM  I've seen and evaluated patient today, reviewed the chart and findings. I agree with the note written by Mr. Diona Fanti. The patient seems to be progressing well from a pulmonary standpoint.  She has no active signs or symptoms of heart failure and draped fibrillation rate is well controlled on current regimen.  Her Lasix was converted to by mouth.  As she is fairly stable cardiac standpoint, we will sign off for now with plans to establish outpatient followup for her to monitor her age of fibrillation. Please do not hesitate to contact us with questions or concerns.  Thank you.  Marykay Lex, M.D., M.S. [facility] [address] [place], [place]  [address]  [phone] Pager # [phone]  05/10/2012 10:37 AM

## 2012-05-10 NOTE — Progress Notes (Signed)
ANTICOAGULATION CONSULT NOTE - Follow Up Consult  Pharmacy Consult for Coumadin Indication: atrial fibrillation  Allergies  Allergen Reactions  . Contrast Media (Iodinated Diagnostic Agents) Hives  . Penicillins Rash    "haven't had it in years"    Patient Measurements: Height: 5\' 3"  (160 cm) Weight: 159 lb (72.122 kg) IBW/kg (Calculated) : 52.4  Heparin Dosing Weight:   Vital Signs: Temp: 98.3 F (36.8 C) (05/29 0312) Temp src: Oral (05/29 0312) BP: 109/70 mmHg (05/29 0312) Pulse Rate: 87  (05/29 0312)  Labs:  Basename 05/10/12 0450 05/09/12 0600 05/08/12 1329 05/08/12 1241  HGB 12.0 11.0* -- --  HCT 35.4* 32.8* -- 39.2  PLT 238 207 -- 232  APTT -- -- 47* --  LABPROT 22.6* 25.0* 24.8* --  INR 1.95* 2.22* 2.20* --  HEPARINUNFRC -- -- -- --  CREATININE -- 0.54 -- 0.54  CKTOTAL -- -- -- --  CKMB -- -- -- --  TROPONINI -- -- -- --    Estimated Creatinine Clearance: 46.3 ml/min (by C-G formula based on Cr of 0.54).  Assessment: 89yof on chronic Coumadin for Afib. INR (1.95) is just below goal after decreasing on home Coumadin regimen (5mg  daily except 2.5mg  M/F). Will continue with home regimen for now, especially since patient is on Levaquin which can increase the INR.  - H/H adn Plts wnl - No bleeding reported  Goal of Therapy:  INR 2-3   Plan:  1. Continue patient's home Coumadin regimen - 5mg  due today  2. Follow-up AM INR  3. Will adjust Levaquin to 750mg  po q48h for PNA coverage and CrCl 20-49 ml/min  Cleon Dew [phone] 05/10/2012,9:21 AM

## 2012-05-10 NOTE — Progress Notes (Signed)
Pt walked with rw 246ft with Rn. Pt tolerated walk. No complaints of SOB. VS stable hr 72. B/p 109/70. Will continue to monitor.    ,RN,BSN

## 2012-05-11 DIAGNOSIS — D696 Thrombocytopenia, unspecified: Secondary | ICD-10-CM

## 2012-05-11 DIAGNOSIS — I509 Heart failure, unspecified: Secondary | ICD-10-CM

## 2012-05-11 DIAGNOSIS — I4891 Unspecified atrial fibrillation: Secondary | ICD-10-CM

## 2012-05-11 LAB — CBC
MCV: 94.9 fL (ref 78.0–100.0)
Platelets: 268 10*3/uL (ref 150–400)
RBC: 3.89 MIL/uL (ref 3.87–5.11)
RDW: 13.4 % (ref 11.5–15.5)
WBC: 8.8 10*3/uL (ref 4.0–10.5)

## 2012-05-11 LAB — PROTIME-INR: INR: 2.05 — ABNORMAL HIGH (ref 0.00–1.49)

## 2012-05-11 MED ORDER — PREDNISONE 10 MG PO TABS: 20.0000 mg | ORAL_TABLET | Freq: Every day | ORAL | Status: DC

## 2012-05-11 MED ORDER — LEVOFLOXACIN 500 MG PO TABS: 500.0000 mg | ORAL_TABLET | Freq: Every day | ORAL | Status: AC

## 2012-05-11 MED ORDER — ALBUTEROL SULFATE HFA 108 (90 BASE) MCG/ACT IN AERS: 1.0000 | INHALATION_SPRAY | Freq: Four times a day (QID) | RESPIRATORY_TRACT | Status: DC | PRN

## 2012-05-11 NOTE — Progress Notes (Signed)
Physical Therapy Treatment Patient Details Name: [name] MRN: [id number] DOB: 11-26-1921 Today's Date: 05/11/2012 Time: 1226-1251 PT Time Calculation (min): 25 min  PT Assessment / Plan / Recommendation Comments on Treatment Session  Pt with PNA who demonstrates balance deficits and educated for balance deficits and highly encouraged to use RW with all ambulation at home to prevent falls and increase stability. Pt verbalized understanding. Continue to recommend OPPT to improve balance and decrease fall risk. Also discussed with pt use of 3in1 over toilet at home for ease of transfers.     Follow Up Recommendations  Outpatient PT;Supervision for mobility/OOB    Barriers to Discharge        Equipment Recommendations  None recommended by PT    Recommendations for Other Services    Frequency     Plan Discharge plan remains appropriate    Precautions / Restrictions Precautions Precautions: Fall   Pertinent Vitals/Pain No pain Vitals stable    Mobility  Bed Mobility Bed Mobility: Not assessed Details for Bed Mobility Assistance: Pt seated EOB upon OTs arrival. Transfers Transfers: Sit to Stand;Stand to Sit;Stand Pivot Transfers Sit to Stand: 6: Modified independent (Device/Increase time) Stand to Sit: 6: Modified independent (Device/Increase time) Stand Pivot Transfers: 6: Modified independent (Device/Increase time) Ambulation/Gait Ambulation/Gait Assistance: 5: Supervision Ambulation Distance (Feet): 400 Feet Assistive device: None Ambulation/Gait Assistance Details: Pt unable to change gait speed and altered gait with head turns particularly horizontally but able to maintain balance without assist Gait Pattern: Within Functional Limits Gait velocity: decreased Stairs: No    Exercises     PT Diagnosis:    PT Problem List:   PT Treatment Interventions:     PT Goals Acute Rehab PT Goals PT Goal: Sit to Stand - Progress: Met PT Transfer Goal: Bed to  Chair/Chair to Bed - Progress: Met Pt will Ambulate: >150 feet;with modified independence;with least restrictive assistive device PT Goal: Ambulate - Progress: Updated due to goal met Additional Goals PT Goal: Additional Goal #1 - Progress: Partly met  Visit Information  Last PT Received On: 05/11/12 Assistance Needed: +1    Subjective Data  Subjective: I started falling after I quit using my RW   Cognition  Overall Cognitive Status: Appears within functional limits for tasks assessed/performed Arousal/Alertness: Awake/alert Orientation Level: Appears intact for tasks assessed Behavior During Session: [facility] for tasks performed    Balance  Balance Balance Assessed: Yes Static Standing Balance Static Standing - Balance Support: Bilateral upper extremity supported Static Standing - Level of Assistance: 5: Stand by assistance Standardized Balance Assessment Standardized Balance Assessment: Berg Balance Test Berg Balance Test Sit to Stand: Able to stand  independently using hands Standing Unsupported: Able to stand safely 2 minutes Sitting with Back Unsupported but Feet Supported on Floor or Stool: Able to sit safely and securely 2 minutes Stand to Sit: Controls descent by using hands Transfers: Able to transfer safely, definite need of hands Standing Unsupported with Eyes Closed: Able to stand 10 seconds safely Standing Ubsupported with Feet Together: Able to place feet together independently and stand 1 minute safely From Standing, Reach Forward with Outstretched Arm: Can reach forward >12 cm safely (5") From Standing Position, Pick up Object from Floor: Able to pick up shoe safely and easily From Standing Position, Turn to Look Behind Over each Shoulder: Turn sideways only but maintains balance Turn 360 Degrees: Able to turn 360 degrees safely but slowly Standing Unsupported, Alternately Place Feet on Step/Stool: Needs assistance to keep from falling  or unable to try Standing  Unsupported, One Foot in Front: Needs help to step but can hold 15 seconds Standing on One Leg: Tries to lift leg/unable to hold 3 seconds but remains standing independently Total Score: 38   End of Session PT - End of Session Equipment Utilized During Treatment: Gait belt Activity Tolerance: Patient tolerated treatment well Patient left: in bed;with call bell/phone within reach Nurse Communication: Mobility status    Delorse Lek 05/11/2012, 1:11 PM Delaney Meigs, PT [phone]

## 2012-05-11 NOTE — Discharge Summary (Signed)
[facility] Hospital Discharge Summary  Date of Admission: 05/08/2012 11:20 AM Admitter: @ADMITPROV @   Date of Discharge5/30/2013 Attending Physician: Rhetta Mura, MD  Things to Follow-up on: Needs INR + BMET in 3-5 days Need to establish with Cardiology for Afib as outpt   [name]:[id number] DOB: 09/10/1921 DOA: 05/08/2012 PCP: Juline Patch, MD, MD  Brief narrative: 78 year old female admitted with right lower lobe pneumonia-community acquired 5/27- symptoms of dyspnea, cough and fever. Sputum is yellow green and she has had difficulties breathing at night. She also c/o weight gain and cramping.    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 Possible nodule lateral view   CT scan chest 5/27 = focal airspace opacity with several air bronchograms and lateral right lower lobe suspicious for pneumonia-no mass or lymphadenopathy   CT head 5/27=no acute intracranial abnormality, mild cerebral atrophy  Antibiotics:  Levofloxacin 5/27-7 days   Hospital Course by problem list: Assessment/Plan: 1. Community-acquired pneumonia- was initially IV levofloxain transitioned to by mouth levofloxacin from tomorrow. White count slightly elevated-dose Levoflox 500 mg q 24 hrs and reassess-Sputum cultures not representative, therefore will not track-she improved to the poitn  of doing really well ambualting and did not have an Oxygen requirement, and was deemed appropriate for out-oatient therapy as she has some recurring dizzyness  2. Abdominal pain-Chronic-unlikely pancreatitis given able to tolerate by mouth with no nausea vomiting.I think she has predominant Constipation and will add a laxative-this will need to be followed in out-patient by PCP 3. A. Fibrillation-Chad score=3.  continue Tiazac 120 mg every afternoon &240 mg every morning-his blood pressure sustains below 90/50 he would cut back dose of Cardizem-continue Lasix changed to 20 mg bid, metoprolol 50 mg XL every afternoon-INR therapeutic for most of hospital stay. Continue Coumadin at 5 mg daily and needs close f/o of INR 3-5 days  4. CHF with acute on chronic component-continue lasix 20 mg daily 5. Elevated blood sugar without diabetes diagnosis-likely 2/2 to steroids-outpatienmt eval with A1c 6. Hyperlipidemia-continue Crestor 5 mg daily  7. Reflux continue Prilosec 20 mg daily  8. History of transitional cell cancer-outpatient urology follow-up  9. History DVT in 2005-continue Coumadin 10. Questionable history rheumatoid arthritis-stable-has seen no one for this recently-might need outpatient rheum appt 11. ?COPD-on CT scan of chest-continue Albuterol and Atrovent    Procedures Performed and pertinent labs: Dg Chest 2 View  05/08/2012  *RADIOLOGY REPORT*  Clinical Data: Chest pain.  CHEST - 2 VIEW  Comparison: 11/08/2011  Findings: The patient has peribronchial thickening which is chronic.  Chronic prominent of the left pericardial fat pad. Pulmonary vascularity is normal.  Lungs are somewhat hyperinflated with flattening of the diaphragm consistent with emphysema.  On the lateral view there is a faint nodular appearing density just behind the sternum which was not visible on multiple prior lateral views of the chest.  I suspect this is a confluence of normal structures but I cannot exclude a new  pulmonary nodule.  CT  scan of the chest without contrast would be useful for further evaluation.  IMPRESSION: Chronic lung disease.  Possible nodule seen on the lateral view. CT scan of the chest without contrast would be useful for further evaluation.  Original Report Authenticated By: Gwynn Burly, M.D.   Ct Head Wo Contrast  05/08/2012  *RADIOLOGY REPORT*  Clinical Data: Headache.  Malaise.  Fever.  CT HEAD WITHOUT CONTRAST  Technique:  Contiguous axial images were obtained from the base of the skull through the vertex without contrast.  Comparison: 06/10/2009  Findings: There is no evidence of intracranial hemorrhage, brain edema or other signs of acute infarction.  There is no evidence of intracranial mass lesion or mass effect.  No abnormal extra-axial fluid collections are identified.  Mild cerebral atrophy and chronic small vessel disease again noted. No evidence of hydrocephalus.  No skull abnormality identified.  IMPRESSION:  1.  No acute intracranial abnormality. 2.  Stable mild cerebral atrophy and chronic small vessel disease.  Original Report Authenticated By: Danae Orleans, M.D.   Ct Chest Wo Contrast  05/08/2012  *RADIOLOGY REPORT*  Clinical Data: Lung nodules seen on chest radiograph.  Cough and fever.  CT CHEST WITHOUT CONTRAST  Technique:  Multidetector CT imaging of the chest was performed following the standard protocol without IV contrast.  Comparison: Chest radiograph on 05/08/2012  Findings: Scattered areas of pleural - parenchymal scarring are seen bilaterally, mainly in the lung bases. A focal area of airspace opacities seen in the lateral right lung base with diffuse central air bronchograms, and pneumonia cannot be excluded.  No suspicious pulmonary nodules masses are identified.  No evidence of pleural or pericardial effusion.  No evidence of hilar or mediastinal masses.  No other areas of lymphadenopathy identified.  Images of the upper abdomen show normal appearance of both  adrenal glands.  Severe diverticulosis is seen involving the splenic flexure the colon.  No significant bone abnormality identified.  IMPRESSION:  1.  Focal area of airspace opacity with several air bronchograms in the lateral right lower lobe, suspicious for pneumonia. 2.  Scattered areas of pleural - parenchymal scarring, and probable COPD. 3.  No evidence of mass or lymphadenopathy.  Original Report Authenticated By: Danae Orleans, M.D.    Discharge Vitals & PE:  BP 108/60  Pulse 83  Temp(Src) 98.3 F (36.8 C) (Oral)  Resp 20  Ht 5\' 3"  (1.6 m)  Wt 71.4 kg (157 lb 6.5 oz)  BMI 27.88 kg/m2  SpO2 96%General: Alert pleasant Caucasian female slightly hard of hearing Cardiovascular: S1-S2 no murmur rub or gallop. Atrial fibrillation on monitor with no red alarms  Respiratory: Clinically clear with no added sound. No tactile vocal resonance and fremitus Abdomen: Soft nontender nondistended. Midline scar the abdomen. Non-tendern epigastric region Skin bilateral lower extremity pitting edema grade 1-2 Neuro intact   Discharge Labs:  Results for orders placed during the hospital encounter of 05/08/12 (from the past 24 hour(s))  PROTIME-INR     Status: Abnormal   Collection Time   05/11/12  5:35 AM      Component Value Range   Prothrombin Time 23.5 (*) 11.6 - 15.2 (seconds)   INR 2.05 (*) 0.00 - 1.49   CBC     Status: Normal   Collection Time   05/11/12  9:49 AM      Component Value Range   WBC 8.8  4.0 - 10.5 (K/uL)   RBC 3.89  3.87 - 5.11 (MIL/uL)   Hemoglobin  12.5  12.0 - 15.0 (g/dL)   HCT 19.1  47.8 - 29.5 (%)   MCV 94.9  78.0 - 100.0 (fL)   MCH 32.1  26.0 - 34.0 (pg)   MCHC 33.9  30.0 - 36.0 (g/dL)   RDW 62.1  30.8 - 65.7 (%)   Platelets 268  150 - 400 (K/uL)    Disposition and follow-up:   [name] was discharged from in good condition.    Follow-up Appointments:  Follow-up Information    Schedule an appointment as soon as possible for a visit with  Juline Patch, MD.   Contact information:   [address] [facility] [place] [address] [phone]       Follow up with [facility]. (As needed if symptoms worsen)    Contact information:   [address] [place] [address] [phone]      Follow up with Marykay Lex, MD on 05/29/2012. (at 2:00pm)    Contact information:   [facility] [address], [address] [place] [address] [phone]          Discharge Medications: Medication List  As of 05/11/2012 11:34 AM   STOP taking these medications         diphenhydrAMINE 25 MG tablet         TAKE these medications         albuterol 108 (90 BASE) MCG/ACT inhaler   Commonly known as: PROVENTIL HFA;VENTOLIN HFA   Inhale 1-2 puffs into the lungs every 6 (six) hours as needed for wheezing.      CALCIUM + D PO   Take 1 tablet by mouth daily.      CINNAMON PO   Take 1 tablet by mouth daily.      clonazePAM 0.5 MG tablet   Commonly known as: KLONOPIN   Take 0.5 mg by mouth daily as needed. Anxiety        diltiazem 240 MG 24 hr capsule   Commonly known as: TIAZAC   Take 240 mg by mouth every morning.      diltiazem 120 MG 24 hr capsule   Commonly known as: TIAZAC   Take 120 mg by mouth at bedtime.      fish oil-omega-3 fatty acids 1000 MG capsule   Take 1 g by mouth 2 (two) times daily.      furosemide 40 MG tablet   Commonly known as: LASIX   Take 20 mg by mouth daily.      levofloxacin 500 MG tablet   Commonly known as: LEVAQUIN   Take 1 tablet (500 mg total) by mouth daily.      metoprolol succinate 50 MG 24 hr tablet   Commonly known as: TOPROL-XL   Take 50 mg by mouth at bedtime.      multivitamins ther. w/minerals Tabs   Take 1 tablet by mouth daily.      omeprazole 20 MG capsule   Commonly known as: PRILOSEC   Take 20 mg by mouth daily.      potassium chloride 10 MEQ CR tablet    Commonly known as: KLOR-CON   Take 10 mEq by mouth every morning.      predniSONE 10 MG tablet   Commonly known as: DELTASONE   Take 2 tablets (20 mg total) by mouth daily.      rosuvastatin 5 MG tablet   Commonly known as: CRESTOR   Take 5 mg by mouth  at bedtime.      TOVIAZ 4 MG Tb24   Generic drug: fesoterodine   Take 4 mg by mouth daily.      warfarin 5 MG tablet   Commonly known as: COUMADIN   Take 5 mg by mouth every evening.           Medications Discontinued During This Encounter  Medication Reason  . diltiazem (CARDIZEM CD) 120 MG 24 hr capsule Change in therapy  . HYDROcodone-acetaminophen (VICODIN) 5-500 MG per tablet Change in therapy  . methocarbamol (ROBAXIN) 750 MG tablet Patient has not taken in last 30 days  . traMADol (ULTRAM) 50 MG tablet Discontinued by provider  . meclizine (ANTIVERT) 25 MG tablet Patient has not taken in last 30 days  . warfarin (COUMADIN) 5 MG tablet   . warfarin (COUMADIN) tablet 2.5 mg   . Warfarin - Pharmacist Dosing Inpatient   . calcium-vitamin D (OSCAL WITH D) 250-125 MG-UNIT per tablet 1 tablet Formulary change  . multivitamins ther. w/minerals tablet 1 tablet   . levofloxacin (LEVAQUIN) IVPB 500 mg   . levofloxacin (LEVAQUIN) IVPB 750 mg   . levofloxacin (LEVAQUIN) IVPB 750 mg   . levofloxacin (LEVAQUIN) tablet 500 mg Dose change  . diltiazem (TIAZAC) 24 hr capsule 120 mg   . levofloxacin (LEVAQUIN) tablet 250 mg   . levofloxacin (LEVAQUIN) tablet 500 mg   . docusate sodium (COLACE) capsule 100 mg   . levofloxacin (LEVAQUIN) 500 MG tablet   . diphenhydrAMINE (BENADRYL) 25 MG tablet Stop Taking at Discharge      > 40 minutes time spent preparing d/c summary, including direct face-face patient Time, contact with consultants, family and care coordination   Signed: Rhetta Mura 05/11/2012, 11:34 AM

## 2012-05-11 NOTE — Progress Notes (Signed)
Pt ambulated with rolling walker approx 500 feet. Tolerate well. Pt stated she feels "light-headed" at times when she stands up.

## 2012-05-11 NOTE — Progress Notes (Signed)
ANTICOAGULATION CONSULT NOTE - Follow Up Consult  Pharmacy Consult for Coumadin Indication: atrial fibrillation  Allergies  Allergen Reactions  . Contrast Media (Iodinated Diagnostic Agents) Hives  . Penicillins Rash    "haven't had it in years"    Patient Measurements: Height: 5\' 3"  (160 cm) Weight: 157 lb 6.5 oz (71.4 kg) IBW/kg (Calculated) : 52.4  Heparin Dosing Weight:   Vital Signs: Temp: 98.3 F (36.8 C) (05/30 0421) Temp src: Oral (05/30 0421) BP: 108/60 mmHg (05/30 0421) Pulse Rate: 83  (05/30 0421)  Labs:  Basename 05/11/12 0535 05/10/12 0450 05/09/12 0600 05/08/12 1329 05/08/12 1241  HGB -- 12.0 11.0* -- --  HCT -- 35.4* 32.8* -- 39.2  PLT -- 238 207 -- 232  APTT -- -- -- 47* --  LABPROT 23.5* 22.6* 25.0* -- --  INR 2.05* 1.95* 2.22* -- --  HEPARINUNFRC -- -- -- -- --  CREATININE -- -- 0.54 -- 0.54  CKTOTAL -- -- -- -- --  CKMB -- -- -- -- --  TROPONINI -- -- -- -- --    Estimated Creatinine Clearance: 46 ml/min (by C-G formula based on Cr of 0.54).  Assessment: 89yof on chronic Coumadin for Afib. INR (2.05) is therapeutic on home regimen (5mg  daily except 2.5mg  M/F). - No CBC this AM - No significant bleeding reported  Goal of Therapy:  INR 2-3   Plan:  1. Continue patient's home Coumadin regimen - 5mg  due today  2. Follow-up AM INR   Cleon Dew [phone] 05/11/2012,9:19 AM

## 2012-05-11 NOTE — Progress Notes (Signed)
Discharge instructions and prescriptions given to pt and daughter. Verbalized understanding. Discontinued pt's IV, tolerated well, no bleeding noted. Pt ready for discharge

## 2012-05-11 NOTE — Evaluation (Signed)
Occupational Therapy Evaluation Patient Details Name: [name] MRN: [id number] DOB: 1921-04-27 Today's Date: 05/11/2012 Time:  -     OT Assessment / Plan / Recommendation Clinical Impression  Pt is an 78 yo female who presents with pna. D/C home with family planned for today. Pt is mod I with all ADLs, has all necessary DME and will have prn A upon return home.    OT Assessment  Patient does not need any further OT services    Follow Up Recommendations  No OT follow up    Barriers to Discharge      Equipment Recommendations  None recommended by OT    Recommendations for Other Services    Frequency       Precautions / Restrictions Precautions Precautions: Fall Restrictions Weight Bearing Restrictions: No   Pertinent Vitals/Pain     ADL  Grooming: Simulated;Modified independent Where Assessed - Grooming: Unsupported standing Upper Body Bathing: Simulated;Modified independent Where Assessed - Upper Body Bathing: Supported standing Lower Body Bathing: Simulated;Modified independent Where Assessed - Lower Body Bathing: Supported sit to stand Upper Body Dressing: Simulated;Modified independent Where Assessed - Upper Body Dressing: Supported standing Lower Body Dressing: Performed;Modified independent Where Assessed - Lower Body Dressing: Unsupported sitting Toilet Transfer: Performed;Modified independent Toilet Transfer Method: Sit to Barista: Regular height toilet;Grab bars Toileting - Clothing Manipulation and Hygiene: Simulated;Modified independent Where Assessed - Toileting Clothing Manipulation and Hygiene: Sit to stand from 3-in-1 or toilet Tub/Shower Transfer: Simulated;Modified independent Tub/Shower Transfer Method: Science writer: Grab bars Equipment Used: Rolling walker Transfers/Ambulation Related to ADLs: Pt ambulated to the bathroom with supervision & RW. 1 VC not to abandon walker before reaching  the bathroom.    OT Diagnosis:    OT Problem List:   OT Treatment Interventions:     OT Goals    Visit Information  Last OT Received On: 05/11/12 Assistance Needed: +1    Subjective Data  Subjective: Id like to see the old gang at outpatient therapy Patient Stated Goal: Not asked   Prior Functioning  Home Living Lives With: Daughter Available Help at Discharge: Family;Available PRN/intermittently Type of Home: Mobile home Home Access: Ramped entrance Home Layout: One level Bathroom Shower/Tub: Engineer, manufacturing systems: Standard (vanity beside.) Home Adaptive Equipment: Walker - rolling;Shower chair with back;Bedside commode/3-in-1;Straight cane;Grab bars in shower Prior Function Level of Independence: Independent with assistive device(s) Driving: Yes Vocation: Retired Musician: No difficulties Dominant Hand: Right    Cognition  Overall Cognitive Status: Appears within functional limits for tasks assessed/performed Arousal/Alertness: Awake/alert Orientation Level: Appears intact for tasks assessed Behavior During Session: [facility] for tasks performed    Extremity/Trunk Assessment Right Upper Extremity Assessment RUE ROM/Strength/Tone: [facility] for tasks assessed Left Upper Extremity Assessment LUE ROM/Strength/Tone: WFL for tasks assessed   Mobility Bed Mobility Details for Bed Mobility Assistance: Pt seated EOB upon OTs arrival. Transfers Sit to Stand: 6: Modified independent (Device/Increase time);From bed;From toilet;With upper extremity assist Stand to Sit: 6: Modified independent (Device/Increase time);To bed;To toilet   Exercise    Balance Balance Balance Assessed: Yes Static Standing Balance Static Standing - Balance Support: Bilateral upper extremity supported Static Standing - Level of Assistance: 5: Stand by assistance  End of Session OT - End of Session Activity Tolerance: Patient tolerated treatment well Patient left: in bed;with  call bell/phone within reach   , A OTR/L [phone] 05/11/2012, 12:03 PM

## 2012-05-15 ENCOUNTER — Ambulatory Visit: Payer: Medicare Other | Attending: Family Medicine | Admitting: Physical Therapy

## 2012-05-15 DIAGNOSIS — R5381 Other malaise: Secondary | ICD-10-CM | POA: Diagnosis not present

## 2012-05-15 DIAGNOSIS — IMO0001 Reserved for inherently not codable concepts without codable children: Secondary | ICD-10-CM | POA: Insufficient documentation

## 2012-05-15 DIAGNOSIS — R279 Unspecified lack of coordination: Secondary | ICD-10-CM | POA: Insufficient documentation

## 2012-05-18 ENCOUNTER — Ambulatory Visit: Payer: Medicare Other | Admitting: Physical Therapy

## 2012-05-22 DIAGNOSIS — I4891 Unspecified atrial fibrillation: Secondary | ICD-10-CM | POA: Diagnosis not present

## 2012-05-22 DIAGNOSIS — Z7901 Long term (current) use of anticoagulants: Secondary | ICD-10-CM | POA: Diagnosis not present

## 2012-05-24 DIAGNOSIS — I4891 Unspecified atrial fibrillation: Secondary | ICD-10-CM | POA: Diagnosis not present

## 2012-05-24 DIAGNOSIS — T07XXXA Unspecified multiple injuries, initial encounter: Secondary | ICD-10-CM | POA: Diagnosis not present

## 2012-05-24 DIAGNOSIS — R7309 Other abnormal glucose: Secondary | ICD-10-CM | POA: Diagnosis not present

## 2012-05-24 DIAGNOSIS — Z0000 Encounter for general adult medical examination without abnormal findings: Secondary | ICD-10-CM | POA: Diagnosis not present

## 2012-05-24 DIAGNOSIS — E7800 Pure hypercholesterolemia, unspecified: Secondary | ICD-10-CM | POA: Diagnosis not present

## 2012-05-25 ENCOUNTER — Encounter: Payer: Medicare Other | Admitting: Physical Therapy

## 2012-05-29 DIAGNOSIS — Z8551 Personal history of malignant neoplasm of bladder: Secondary | ICD-10-CM | POA: Diagnosis not present

## 2012-06-01 DIAGNOSIS — I4891 Unspecified atrial fibrillation: Secondary | ICD-10-CM | POA: Diagnosis not present

## 2012-06-01 DIAGNOSIS — I10 Essential (primary) hypertension: Secondary | ICD-10-CM | POA: Diagnosis not present

## 2012-06-01 DIAGNOSIS — J189 Pneumonia, unspecified organism: Secondary | ICD-10-CM | POA: Diagnosis not present

## 2012-06-01 DIAGNOSIS — R7309 Other abnormal glucose: Secondary | ICD-10-CM | POA: Diagnosis not present

## 2012-06-02 DIAGNOSIS — I4891 Unspecified atrial fibrillation: Secondary | ICD-10-CM | POA: Diagnosis not present

## 2012-06-02 DIAGNOSIS — Z01810 Encounter for preprocedural cardiovascular examination: Secondary | ICD-10-CM | POA: Diagnosis not present

## 2012-06-06 ENCOUNTER — Other Ambulatory Visit: Payer: Self-pay | Admitting: Internal Medicine

## 2012-06-06 ENCOUNTER — Ambulatory Visit
Admission: RE | Admit: 2012-06-06 | Discharge: 2012-06-06 | Disposition: A | Discharge status: Home / Self Care | Payer: Medicare Other | Source: Ambulatory Visit | Attending: Internal Medicine | Admitting: Internal Medicine

## 2012-06-06 DIAGNOSIS — Z7901 Long term (current) use of anticoagulants: Secondary | ICD-10-CM | POA: Diagnosis not present

## 2012-06-06 DIAGNOSIS — S0990XA Unspecified injury of head, initial encounter: Secondary | ICD-10-CM | POA: Diagnosis not present

## 2012-06-06 DIAGNOSIS — I4891 Unspecified atrial fibrillation: Secondary | ICD-10-CM | POA: Diagnosis not present

## 2012-06-06 DIAGNOSIS — I999 Unspecified disorder of circulatory system: Secondary | ICD-10-CM | POA: Diagnosis not present

## 2012-06-06 DIAGNOSIS — G319 Degenerative disease of nervous system, unspecified: Secondary | ICD-10-CM | POA: Diagnosis not present

## 2012-06-19 DIAGNOSIS — H43819 Vitreous degeneration, unspecified eye: Secondary | ICD-10-CM | POA: Diagnosis not present

## 2012-07-06 DIAGNOSIS — I4891 Unspecified atrial fibrillation: Secondary | ICD-10-CM | POA: Diagnosis not present

## 2012-07-06 DIAGNOSIS — Z7901 Long term (current) use of anticoagulants: Secondary | ICD-10-CM | POA: Diagnosis not present

## 2012-07-20 DIAGNOSIS — I4891 Unspecified atrial fibrillation: Secondary | ICD-10-CM | POA: Diagnosis not present

## 2012-07-20 DIAGNOSIS — Z7901 Long term (current) use of anticoagulants: Secondary | ICD-10-CM | POA: Diagnosis not present

## 2012-08-03 DIAGNOSIS — Z7901 Long term (current) use of anticoagulants: Secondary | ICD-10-CM | POA: Diagnosis not present

## 2012-08-03 DIAGNOSIS — I4891 Unspecified atrial fibrillation: Secondary | ICD-10-CM | POA: Diagnosis not present

## 2012-08-21 ENCOUNTER — Other Ambulatory Visit: Payer: Self-pay | Admitting: Internal Medicine

## 2012-08-21 DIAGNOSIS — R1011 Right upper quadrant pain: Secondary | ICD-10-CM | POA: Diagnosis not present

## 2012-08-21 DIAGNOSIS — R05 Cough: Secondary | ICD-10-CM | POA: Diagnosis not present

## 2012-08-21 DIAGNOSIS — M25569 Pain in unspecified knee: Secondary | ICD-10-CM | POA: Diagnosis not present

## 2012-08-21 DIAGNOSIS — R059 Cough, unspecified: Secondary | ICD-10-CM | POA: Diagnosis not present

## 2012-08-22 DIAGNOSIS — I4891 Unspecified atrial fibrillation: Secondary | ICD-10-CM | POA: Diagnosis not present

## 2012-08-22 DIAGNOSIS — Z7901 Long term (current) use of anticoagulants: Secondary | ICD-10-CM | POA: Diagnosis not present

## 2012-08-22 DIAGNOSIS — IMO0001 Reserved for inherently not codable concepts without codable children: Secondary | ICD-10-CM | POA: Diagnosis not present

## 2012-08-24 ENCOUNTER — Ambulatory Visit
Admission: RE | Admit: 2012-08-24 | Discharge: 2012-08-24 | Disposition: A | Discharge status: Home / Self Care | Payer: Medicare Other | Source: Ambulatory Visit | Attending: Internal Medicine | Admitting: Internal Medicine

## 2012-08-24 ENCOUNTER — Other Ambulatory Visit: Payer: Self-pay | Admitting: Internal Medicine

## 2012-08-24 DIAGNOSIS — R1011 Right upper quadrant pain: Secondary | ICD-10-CM

## 2012-08-30 DIAGNOSIS — R300 Dysuria: Secondary | ICD-10-CM | POA: Diagnosis not present

## 2012-08-30 DIAGNOSIS — N3941 Urge incontinence: Secondary | ICD-10-CM | POA: Diagnosis not present

## 2012-08-30 DIAGNOSIS — R3129 Other microscopic hematuria: Secondary | ICD-10-CM | POA: Diagnosis not present

## 2012-08-30 DIAGNOSIS — Z8551 Personal history of malignant neoplasm of bladder: Secondary | ICD-10-CM | POA: Diagnosis not present

## 2012-08-31 DIAGNOSIS — R609 Edema, unspecified: Secondary | ICD-10-CM | POA: Diagnosis not present

## 2012-09-04 DIAGNOSIS — M159 Polyosteoarthritis, unspecified: Secondary | ICD-10-CM | POA: Diagnosis not present

## 2012-09-04 DIAGNOSIS — M25569 Pain in unspecified knee: Secondary | ICD-10-CM | POA: Diagnosis not present

## 2012-09-21 DIAGNOSIS — I4891 Unspecified atrial fibrillation: Secondary | ICD-10-CM | POA: Diagnosis not present

## 2012-09-21 DIAGNOSIS — Z7901 Long term (current) use of anticoagulants: Secondary | ICD-10-CM | POA: Diagnosis not present

## 2012-09-21 DIAGNOSIS — Z23 Encounter for immunization: Secondary | ICD-10-CM | POA: Diagnosis not present

## 2012-09-22 DIAGNOSIS — M19039 Primary osteoarthritis, unspecified wrist: Secondary | ICD-10-CM | POA: Diagnosis not present

## 2012-09-22 DIAGNOSIS — M79609 Pain in unspecified limb: Secondary | ICD-10-CM | POA: Diagnosis not present

## 2012-09-22 DIAGNOSIS — R609 Edema, unspecified: Secondary | ICD-10-CM | POA: Diagnosis not present

## 2012-09-22 DIAGNOSIS — H539 Unspecified visual disturbance: Secondary | ICD-10-CM | POA: Diagnosis not present

## 2012-10-11 DIAGNOSIS — Z7901 Long term (current) use of anticoagulants: Secondary | ICD-10-CM | POA: Diagnosis not present

## 2012-10-11 DIAGNOSIS — J069 Acute upper respiratory infection, unspecified: Secondary | ICD-10-CM | POA: Diagnosis not present

## 2012-10-11 DIAGNOSIS — E785 Hyperlipidemia, unspecified: Secondary | ICD-10-CM | POA: Diagnosis not present

## 2012-10-11 DIAGNOSIS — I10 Essential (primary) hypertension: Secondary | ICD-10-CM | POA: Diagnosis not present

## 2012-10-17 DIAGNOSIS — J45909 Unspecified asthma, uncomplicated: Secondary | ICD-10-CM | POA: Diagnosis not present

## 2012-10-17 DIAGNOSIS — Z7901 Long term (current) use of anticoagulants: Secondary | ICD-10-CM | POA: Diagnosis not present

## 2012-10-31 DIAGNOSIS — I824Z9 Acute embolism and thrombosis of unspecified deep veins of unspecified distal lower extremity: Secondary | ICD-10-CM | POA: Diagnosis not present

## 2012-11-14 DIAGNOSIS — I824Z9 Acute embolism and thrombosis of unspecified deep veins of unspecified distal lower extremity: Secondary | ICD-10-CM | POA: Diagnosis not present

## 2012-11-23 DIAGNOSIS — I4891 Unspecified atrial fibrillation: Secondary | ICD-10-CM | POA: Diagnosis not present

## 2012-11-23 DIAGNOSIS — Z7901 Long term (current) use of anticoagulants: Secondary | ICD-10-CM | POA: Diagnosis not present

## 2012-11-24 DIAGNOSIS — I824Z9 Acute embolism and thrombosis of unspecified deep veins of unspecified distal lower extremity: Secondary | ICD-10-CM | POA: Diagnosis not present

## 2012-11-27 DIAGNOSIS — Z8551 Personal history of malignant neoplasm of bladder: Secondary | ICD-10-CM | POA: Diagnosis not present

## 2012-12-01 DIAGNOSIS — I824Z9 Acute embolism and thrombosis of unspecified deep veins of unspecified distal lower extremity: Secondary | ICD-10-CM | POA: Diagnosis not present

## 2012-12-14 DIAGNOSIS — I824Z9 Acute embolism and thrombosis of unspecified deep veins of unspecified distal lower extremity: Secondary | ICD-10-CM | POA: Diagnosis not present

## 2012-12-27 DIAGNOSIS — I4891 Unspecified atrial fibrillation: Secondary | ICD-10-CM | POA: Diagnosis not present

## 2012-12-27 DIAGNOSIS — E782 Mixed hyperlipidemia: Secondary | ICD-10-CM | POA: Diagnosis not present

## 2012-12-27 DIAGNOSIS — Z01810 Encounter for preprocedural cardiovascular examination: Secondary | ICD-10-CM | POA: Diagnosis not present

## 2012-12-28 DIAGNOSIS — R609 Edema, unspecified: Secondary | ICD-10-CM | POA: Diagnosis not present

## 2012-12-28 DIAGNOSIS — E119 Type 2 diabetes mellitus without complications: Secondary | ICD-10-CM | POA: Diagnosis not present

## 2012-12-28 DIAGNOSIS — I4891 Unspecified atrial fibrillation: Secondary | ICD-10-CM | POA: Diagnosis not present

## 2012-12-28 DIAGNOSIS — E7800 Pure hypercholesterolemia, unspecified: Secondary | ICD-10-CM | POA: Diagnosis not present

## 2012-12-28 DIAGNOSIS — Z7901 Long term (current) use of anticoagulants: Secondary | ICD-10-CM | POA: Diagnosis not present

## 2013-01-03 DIAGNOSIS — I4891 Unspecified atrial fibrillation: Secondary | ICD-10-CM | POA: Diagnosis not present

## 2013-01-03 DIAGNOSIS — M159 Polyosteoarthritis, unspecified: Secondary | ICD-10-CM | POA: Diagnosis not present

## 2013-01-03 DIAGNOSIS — M25569 Pain in unspecified knee: Secondary | ICD-10-CM | POA: Diagnosis not present

## 2013-01-03 DIAGNOSIS — I10 Essential (primary) hypertension: Secondary | ICD-10-CM | POA: Diagnosis not present

## 2013-01-17 DIAGNOSIS — H903 Sensorineural hearing loss, bilateral: Secondary | ICD-10-CM | POA: Diagnosis not present

## 2013-01-17 DIAGNOSIS — H905 Unspecified sensorineural hearing loss: Secondary | ICD-10-CM | POA: Diagnosis not present

## 2013-01-25 ENCOUNTER — Encounter (HOSPITAL_COMMUNITY): Payer: Self-pay | Admitting: Pharmacy Technician

## 2013-01-29 DIAGNOSIS — Z7901 Long term (current) use of anticoagulants: Secondary | ICD-10-CM | POA: Diagnosis not present

## 2013-01-29 DIAGNOSIS — I4891 Unspecified atrial fibrillation: Secondary | ICD-10-CM | POA: Diagnosis not present

## 2013-01-30 ENCOUNTER — Emergency Department (HOSPITAL_COMMUNITY): Payer: Medicare Other

## 2013-01-30 ENCOUNTER — Other Ambulatory Visit (HOSPITAL_COMMUNITY): Payer: Self-pay | Admitting: Orthopedic Surgery

## 2013-01-30 ENCOUNTER — Encounter (HOSPITAL_COMMUNITY): Payer: Self-pay

## 2013-01-30 ENCOUNTER — Emergency Department (HOSPITAL_COMMUNITY)
Admission: EM | Admit: 2013-01-30 | Discharge: 2013-01-30 | Disposition: A | Discharge status: Home / Self Care | Payer: Medicare Other | Attending: Emergency Medicine | Admitting: Emergency Medicine

## 2013-01-30 ENCOUNTER — Encounter (HOSPITAL_COMMUNITY)
Admission: RE | Admit: 2013-01-30 | Discharge: 2013-01-30 | Disposition: A | Discharge status: Home / Self Care | Payer: Medicare Other | Source: Ambulatory Visit | Attending: Orthopedic Surgery | Admitting: Orthopedic Surgery

## 2013-01-30 ENCOUNTER — Encounter (HOSPITAL_COMMUNITY): Payer: Self-pay | Admitting: Emergency Medicine

## 2013-01-30 DIAGNOSIS — Y92009 Unspecified place in unspecified non-institutional (private) residence as the place of occurrence of the external cause: Secondary | ICD-10-CM | POA: Insufficient documentation

## 2013-01-30 DIAGNOSIS — I2510 Atherosclerotic heart disease of native coronary artery without angina pectoris: Secondary | ICD-10-CM | POA: Diagnosis not present

## 2013-01-30 DIAGNOSIS — S0003XA Contusion of scalp, initial encounter: Secondary | ICD-10-CM | POA: Diagnosis not present

## 2013-01-30 DIAGNOSIS — Z8679 Personal history of other diseases of the circulatory system: Secondary | ICD-10-CM | POA: Diagnosis not present

## 2013-01-30 DIAGNOSIS — W19XXXA Unspecified fall, initial encounter: Secondary | ICD-10-CM

## 2013-01-30 DIAGNOSIS — Z8551 Personal history of malignant neoplasm of bladder: Secondary | ICD-10-CM | POA: Insufficient documentation

## 2013-01-30 DIAGNOSIS — S300XXA Contusion of lower back and pelvis, initial encounter: Secondary | ICD-10-CM | POA: Diagnosis not present

## 2013-01-30 DIAGNOSIS — E041 Nontoxic single thyroid nodule: Secondary | ICD-10-CM | POA: Diagnosis not present

## 2013-01-30 DIAGNOSIS — Z86718 Personal history of other venous thrombosis and embolism: Secondary | ICD-10-CM | POA: Diagnosis not present

## 2013-01-30 DIAGNOSIS — Z8719 Personal history of other diseases of the digestive system: Secondary | ICD-10-CM | POA: Insufficient documentation

## 2013-01-30 DIAGNOSIS — Z8709 Personal history of other diseases of the respiratory system: Secondary | ICD-10-CM | POA: Insufficient documentation

## 2013-01-30 DIAGNOSIS — M533 Sacrococcygeal disorders, not elsewhere classified: Secondary | ICD-10-CM | POA: Diagnosis not present

## 2013-01-30 DIAGNOSIS — Z8701 Personal history of pneumonia (recurrent): Secondary | ICD-10-CM | POA: Insufficient documentation

## 2013-01-30 DIAGNOSIS — Z87891 Personal history of nicotine dependence: Secondary | ICD-10-CM | POA: Diagnosis not present

## 2013-01-30 DIAGNOSIS — S1093XA Contusion of unspecified part of neck, initial encounter: Secondary | ICD-10-CM | POA: Insufficient documentation

## 2013-01-30 DIAGNOSIS — Z79899 Other long term (current) drug therapy: Secondary | ICD-10-CM | POA: Insufficient documentation

## 2013-01-30 DIAGNOSIS — S0010XA Contusion of unspecified eyelid and periocular area, initial encounter: Secondary | ICD-10-CM | POA: Insufficient documentation

## 2013-01-30 DIAGNOSIS — S0993XA Unspecified injury of face, initial encounter: Secondary | ICD-10-CM | POA: Diagnosis not present

## 2013-01-30 DIAGNOSIS — W1809XA Striking against other object with subsequent fall, initial encounter: Secondary | ICD-10-CM | POA: Insufficient documentation

## 2013-01-30 DIAGNOSIS — E7800 Pure hypercholesterolemia, unspecified: Secondary | ICD-10-CM | POA: Insufficient documentation

## 2013-01-30 DIAGNOSIS — I10 Essential (primary) hypertension: Secondary | ICD-10-CM | POA: Diagnosis not present

## 2013-01-30 DIAGNOSIS — Z7901 Long term (current) use of anticoagulants: Secondary | ICD-10-CM | POA: Diagnosis not present

## 2013-01-30 DIAGNOSIS — D689 Coagulation defect, unspecified: Secondary | ICD-10-CM | POA: Diagnosis not present

## 2013-01-30 DIAGNOSIS — S0083XA Contusion of other part of head, initial encounter: Secondary | ICD-10-CM | POA: Diagnosis not present

## 2013-01-30 DIAGNOSIS — K219 Gastro-esophageal reflux disease without esophagitis: Secondary | ICD-10-CM | POA: Diagnosis not present

## 2013-01-30 DIAGNOSIS — S0990XA Unspecified injury of head, initial encounter: Secondary | ICD-10-CM | POA: Diagnosis not present

## 2013-01-30 DIAGNOSIS — D6832 Hemorrhagic disorder due to extrinsic circulating anticoagulants: Secondary | ICD-10-CM

## 2013-01-30 DIAGNOSIS — Y9389 Activity, other specified: Secondary | ICD-10-CM | POA: Insufficient documentation

## 2013-01-30 DIAGNOSIS — Z8669 Personal history of other diseases of the nervous system and sense organs: Secondary | ICD-10-CM | POA: Diagnosis not present

## 2013-01-30 DIAGNOSIS — IMO0002 Reserved for concepts with insufficient information to code with codable children: Secondary | ICD-10-CM | POA: Insufficient documentation

## 2013-01-30 HISTORY — DX: Diverticulitis of intestine, part unspecified, without perforation or abscess without bleeding: K57.92

## 2013-01-30 LAB — PROTIME-INR
INR: 2.64 — ABNORMAL HIGH (ref 0.00–1.49)
INR: 2.51 — ABNORMAL HIGH (ref 0.00–1.49)
Prothrombin Time: 26.9 seconds — ABNORMAL HIGH (ref 11.6–15.2)

## 2013-01-30 LAB — APTT: aPTT: 42 seconds — ABNORMAL HIGH (ref 24–37)

## 2013-01-30 LAB — URINE MICROSCOPIC-ADD ON

## 2013-01-30 LAB — URINALYSIS, ROUTINE W REFLEX MICROSCOPIC
Nitrite: NEGATIVE
Specific Gravity, Urine: 1.009 (ref 1.005–1.030)
pH: 6.5 (ref 5.0–8.0)

## 2013-01-30 LAB — CBC
HCT: 40.5 % (ref 36.0–46.0)
Hemoglobin: 13.8 g/dL (ref 12.0–15.0)
RBC: 4.29 MIL/uL (ref 3.87–5.11)
WBC: 12.8 10*3/uL — ABNORMAL HIGH (ref 4.0–10.5)

## 2013-01-30 LAB — SURGICAL PCR SCREEN: Staphylococcus aureus: NEGATIVE

## 2013-01-30 LAB — BASIC METABOLIC PANEL
Chloride: 93 mEq/L — ABNORMAL LOW (ref 96–112)
GFR calc Af Amer: 84 mL/min — ABNORMAL LOW (ref >90)
Potassium: 3.7 mEq/L (ref 3.5–5.1)

## 2013-01-30 NOTE — Patient Instructions (Addendum)
20 KENDAL GHAZARIAN  01/30/2013   Your procedure is scheduled on:   Report to [facility] at 1150 AM.  Call this number if you have problems the morning of surgery [phone]   Remember:   Do not eat food  :After Midnight.  Clear liquids midnight until 0820 am day of surgery, then nothing by mouth.   Take these medicines the morning of surgery with A SIP OF WATER: PRILOSEC, DILTIAZEM                                SEE [place] PREPARING FOR SURGERY SHEET   Do not wear jewelry, make-up or nail polish.  Do not wear lotions, powders, or perfumes. You may wear deodorant.   Men may shave face and neck.  Do not bring valuables to the hospital.  Contacts, dentures or bridgework may not be worn into surgery.  Leave suitcase in the car. After surgery it may be brought to your room.  For patients admitted to the hospital, checkout time is 11:00 AM the day of discharge.   Patients discharged the day of surgery will not be allowed to drive home.  Name and phone number of your driver:  Special Instructions: N/A   Please read over the following fact sheets that you were given: MRSA Information., blood fact sheet, incentive spiriometer fact sheet, clear liquid sheet Call Cain Sieve RN pre op nurse if needed [phone]    FAILURE TO FOLLOW THESE INSTRUCTIONS MAY RESULT IN THE CANCELLATION OF YOUR SURGERY. PATIENT SIGNATURE___________________________________________

## 2013-01-30 NOTE — Progress Notes (Addendum)
Lov/ekg  12-27-2012 dr harding [facility] on char, CARDIAC CLEARANCE NOTE [facility] ON CHART MEDICAL CLEARANCE NOTE DR Selena Batten ON CHART 05-08-2012 CHEST 2 VIEW EPIC 05-09-2012 ECHO EPIC

## 2013-01-30 NOTE — ED Notes (Signed)
MD at bedside. 
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
## 2013-01-30 NOTE — ED Provider Notes (Signed)
History     CSN: [id number]  Arrival date & time 01/30/13  1218   First MD Initiated Contact with Patient 01/30/13 1245      Chief Complaint  Patient presents with  . Fall  . Head Injury  . Tailbone Pain    (Consider location/radiation/quality/duration/timing/severity/associated sxs/prior treatment) Patient is a 79 y.o. female presenting with fall and head injury. The history is provided by the patient.  Fall  Head Injury She was getting out of her car home when it started rolling and knocked her down. She landed on her Botox and then hit her head. She denies loss of consciousness. She's complaining of pain in her tailbone and also in the back of her head. Pain is mild and rated at 2/10. She also noticed a small black and blue area adjacent to her left eye. She denies other injury. Of note, and she is on warfarin anticoagulation for atrial fibrillation and INR had been checked yesterday was elevated at 3.8. Also, of note, she is scheduled to come off of warfarin in the way and go on Lovenox in anticipation of knee replacement surgery next week.  Past Medical History  Diagnosis Date  . Coronary artery disease   . Hypertension   . GERD (gastroesophageal reflux disease)   . Headache   . High cholesterol   . Atrial fibrillation   . DVT (deep venous thrombosis), right 2005    "dry blood; after 8 foot fall"  . Pneumonia ~ 2010  . Chronic bronchitis     "get it basically 3 times/yr"  . Blood transfusion 1960  . H/O hiatal hernia   . Bladder cancer   . Rheumatoid arthritis   . Concussion w/o coma 03/31/2004    "even now has times when she's not able to comprehend" (05/08/12)    Past Surgical History  Procedure Laterality Date  . Back surgery  1976    tumor removed-benign  . Tonsillectomy  1952  . Transurethral resection of bladder tumor  11/11/2011    Procedure: TRANSURETHRAL RESECTION OF BLADDER TUMOR (TURBT);  Surgeon: Anner Crete;  Location: [facility];  Service: Urology;   Laterality: N/A;  Cysto, Bladder Biopsy, TURBT with Gyrus,   . Cystoscopy  11/11/2011    Procedure: CYSTOSCOPY;  Surgeon: Anner Crete;  Location: [facility];  Service: Urology;  Laterality: N/A;  . Bladder lift    . Bladder cancer  2010; 2009    "for tumors on surface of bladder"  . Breast cyst excision      2 cysts removed-left breast-benign  . Abdominal hysterectomy  1960    partial hysterectomy  . Knee arthroscopy  2005    right; S/P fall  . Cataract extraction w/ intraocular lens  implant, bilateral  1981  . Cardiac catheterization  1980's    Family History  Problem Relation Age of Onset  . Anesthesia problems Daughter     History  Substance Use Topics  . Smoking status: Former Smoker -- 1.00 packs/day for 50 years    Types: Cigarettes    Quit date: 11/07/1982  . Smokeless tobacco: Not on file  . Alcohol Use: 3.0 oz/week    5 Glasses of wine per week    OB History   Grav Para Term Preterm Abortions TAB SAB Ect Mult Living                  Review of Systems  All other systems reviewed and are negative.    Allergies  Contrast media and Penicillins  Home Medications   Current Outpatient Rx  Name  Route  Sig  Dispense  Refill  . acetaminophen (TYLENOL) 500 MG tablet   Oral   Take 1,000 mg by mouth every 6 (six) hours as needed for pain.         . Calcium Carbonate-Vit D-Min (CALCIUM 1200 PO)   Oral   Take by mouth.         . Cinnamon 500 MG capsule   Oral   Take 1,000 mg by mouth daily.         Marland Kitchen diltiazem (TIAZAC) 240 MG 24 hr capsule   Oral   Take 240 mg by mouth every morning.         . docusate sodium (COLACE) 100 MG capsule   Oral   Take 200 mg by mouth daily as needed for constipation.         Marland Kitchen esomeprazole (NEXIUM) 20 MG capsule   Oral   Take 20 mg by mouth daily before breakfast.         . fish oil-omega-3 fatty acids 1000 MG capsule   Oral   Take 1 g by mouth 2 (two) times daily.          . furosemide (LASIX) 40 MG  tablet   Oral   Take 20 mg by mouth daily.          . metoprolol (TOPROL-XL) 50 MG 24 hr tablet   Oral   Take 50 mg by mouth at bedtime.          . Multiple Vitamins-Minerals (MULTIVITAMINS THER. W/MINERALS) TABS   Oral   Take 1 tablet by mouth daily.          . rosuvastatin (CRESTOR) 5 MG tablet   Oral   Take 5 mg by mouth at bedtime.          Marland Kitchen spironolactone (ALDACTONE) 25 MG tablet   Oral   Take 25 mg by mouth daily.         Marland Kitchen warfarin (COUMADIN) 7.5 MG tablet   Oral   Take 7.5 mg by mouth daily.           BP 131/62  Pulse 113  Temp(Src) 97.5 F (36.4 C) (Oral)  Resp 18  SpO2 98%  Physical Exam  Nursing note and vitals reviewed.  79 year old female, resting comfortably and in no acute distress. Vital signs are significant for tachycardia with heart rate of 113. Oxygen saturation is 98%, which is normal. Head is normocephalic. 3 mm ecchymosis present lateral aspect of left orbital rim - no step-off palpated. Amsll hematoma present right side of occiput. PERRLA, EOMI. Oropharynx is clear. Neck is nontender without adenopathy or JVD. Back has mild tenderness over the coccyx area. There is no CVA tenderness. Lungs are clear without rales, wheezes, or rhonchi. Chest is nontender. Heart has regular rate and rhythm without murmur. Abdomen is soft, flat, nontender without masses or hepatosplenomegaly and peristalsis is normoactive. Extremities have 2-3+ edema with chronic venous stasis changes present, full range of motion is present. Skin is warm and dry without rash. Neurologic: Mental status is normal, cranial nerves are intact, there are no motor or sensory deficits.   ED Course  Procedures (including critical care time)  Results for orders placed during the hospital encounter of 01/30/13  PROTIME-INR      Result Value Range   Prothrombin Time 26.9 (*) 11.6 - 15.2 seconds  INR 2.64 (*) 0.00 - 1.49   Dg Sacrum/coccyx  01/30/2013  *RADIOLOGY  REPORT*  Clinical Data: Larey Seat.  Sacral pain.  SACRUM AND COCCYX - 2+ VIEW  Comparison: None  Findings: The pubic symphysis and SI joints are intact.  Both hips are normally located.  Asymmetric right-sided hip joint degenerative changes.  No obvious acute fracture of the sacrum or coccyx.  If pain persists CT may be helpful for further evaluation.  IMPRESSION:  No obvious acute fracture.   Original Report Authenticated By: Rudie Meyer, M.D.    Ct Head Wo Contrast  01/30/2013  *RADIOLOGY REPORT*  Clinical Data:  Fall  CT HEAD WITHOUT CONTRAST CT CERVICAL SPINE WITHOUT CONTRAST  Technique:  Multidetector CT imaging of the head and cervical spine was performed following the standard protocol without intravenous contrast.  Multiplanar CT image reconstructions of the cervical spine were also generated.  Comparison:  06/06/2012  CT HEAD  Findings: Minimal chronic ischemic changes in the periventricular white matter. No mass effect, midline shift, or acute intracranial hemorrhage.  Mastoid air cells and visualized paranasal sinuses are clear.  Cranium is intact.  IMPRESSION: No acute intracranial pathology.  CT CERVICAL SPINE  Findings: No acute fracture.  No dislocation.  Scattered degenerative changes are noted.  Prominent right-sided facet arthropathy at C4-5 with coexisting mild anterolisthesis of C4 upon C5.  Posterior disc osteophytes occur most prominently at C3-4. Minimal uncovertebral osteophytes.  There is some foraminal narrowing on the right at C4-5 secondary to facet arthropathy.  Atherosclerotic calcified plaque is noted.  1.9 cm right thyroid nodule.  IMPRESSION: No acute bony injury in the cervical spine.  Degenerative change.  1.9 cm right thyroid nodule.  Thyroid ultrasound is recommended.   Original Report Authenticated By: Jolaine Click, M.D.    Ct Cervical Spine Wo Contrast  01/30/2013  *RADIOLOGY REPORT*  Clinical Data:  Fall  CT HEAD WITHOUT CONTRAST CT CERVICAL SPINE WITHOUT CONTRAST  Technique:   Multidetector CT imaging of the head and cervical spine was performed following the standard protocol without intravenous contrast.  Multiplanar CT image reconstructions of the cervical spine were also generated.  Comparison:  06/06/2012  CT HEAD  Findings: Minimal chronic ischemic changes in the periventricular white matter. No mass effect, midline shift, or acute intracranial hemorrhage.  Mastoid air cells and visualized paranasal sinuses are clear.  Cranium is intact.  IMPRESSION: No acute intracranial pathology.  CT CERVICAL SPINE  Findings: No acute fracture.  No dislocation.  Scattered degenerative changes are noted.  Prominent right-sided facet arthropathy at C4-5 with coexisting mild anterolisthesis of C4 upon C5.  Posterior disc osteophytes occur most prominently at C3-4. Minimal uncovertebral osteophytes.  There is some foraminal narrowing on the right at C4-5 secondary to facet arthropathy.  Atherosclerotic calcified plaque is noted.  1.9 cm right thyroid nodule.  IMPRESSION: No acute bony injury in the cervical spine.  Degenerative change.  1.9 cm right thyroid nodule.  Thyroid ultrasound is recommended.   Original Report Authenticated By: Jolaine Click, M.D.       1. Fall   2. Scalp contusion   3. Periorbital ecchymosis   4. Contusion of coccyx   5. Warfarin-induced coagulopathy   6. Thyroid nodule       MDM  Fall with head injury and patient on warfarin anticoagulation. She will need CT of her head and will also need to be advised of possibility of delayed bleeding. Plain films of the obtained of sacrum and coccyx.  X-rays  and CT scans are unremarkable except for incidental finding of thyroid nodule which will need outpatient ultrasound. INR has fallen into the therapeutic range. She is sent home with 3 instructions and advised of the possibility of delayed bleeding.     Dione Booze, MD 01/30/13 [phone]

## 2013-01-30 NOTE — ED Notes (Signed)
Pt Discharged and went to pre-op with her family.  Unable to discharge out of system at this time due to pt's chart being accessed by, Maudry Diego.

## 2013-01-31 DIAGNOSIS — IMO0002 Reserved for concepts with insufficient information to code with codable children: Secondary | ICD-10-CM | POA: Diagnosis not present

## 2013-01-31 DIAGNOSIS — M1710 Unilateral primary osteoarthritis, unspecified knee: Secondary | ICD-10-CM | POA: Diagnosis not present

## 2013-01-31 NOTE — Progress Notes (Signed)
08-21-2012 chest 2 view xray [facility] on chart

## 2013-01-31 NOTE — Progress Notes (Signed)
Micro ua results faxed to dr olin by epic 

## 2013-02-04 NOTE — H&P (Signed)
TOTAL KNEE ADMISSION H&P  Patient is being admitted for right total knee arthroplasty.  Subjective:  Chief Complaint:  Right knee OA / pain.  HPI: [name], 79 y.o. female, has a history of pain and functional disability in the right knee due to arthritis and has failed non-surgical conservative treatments for greater than 12 weeks to includeNSAID's and/or analgesics, corticosteriod injections and activity modification.  Onset of symptoms was gradual, starting 9 years ago with gradually worsening course since that time. The patient noted no past surgery on the right knee(s).  Patient currently rates pain in the right knee(s) at 8 out of 10 with activity. Patient has night pain, worsening of pain with activity and weight bearing, pain that interferes with activities of daily living, pain with passive range of motion, crepitus and joint swelling.  Patient has evidence of periarticular osteophytes and joint space narrowing by imaging studies. There is no active infection.  Risks, benefits and expectations were discussed with the patient. Patient understand the risks, benefits and expectations and wishes to proceed with surgery.   D/C Plans:   SNF  Post-op Meds:   No Rx given   Tranexamic Acid:   Not to be given - CAD / A-fib / DVT  Decadron:    To be given  FYI:    After surgery will need Coumadin with branching Lovenox  Patient Active Problem List   Diagnosis Date Noted  . Normal coronary angiogram 1999 after an abnormal stress test 05/10/2012  . Chronic anticoagulation 05/10/2012  . Atrial fibrillation, persistent 05/10/2012  . PVD, moderate bilat carotid disease 05/10/2012  . Normal left ventricular systolic function, 2D 5/11 05/10/2012  . Elevated brain natriuretic peptide (BNP) level- 1558 on admission 05/10/2012  . Dyslipidemia 05/10/2012  . Hypertension   . GERD (gastroesophageal reflux disease)   . DVT (deep venous thrombosis), right   . Pneumonia   . Bladder cancer    . Rheumatoid arthritis   . Bronchitis 11/11/2011  . Primary bladder malignant neoplasm 11/10/2011  . CANDIDIASIS, ESOPHAGEAL 01/26/2008  . COLONIC POLYPS, ADENOMATOUS 01/26/2008  . HEMORRHOIDS, EXTERNAL 01/26/2008  . GASTRITIS, CHRONIC 01/26/2008  . HIATAL HERNIA 01/26/2008  . DIVERTICULOSIS OF COLON 01/26/2008   Past Medical History  Diagnosis Date  . Coronary artery disease   . GERD (gastroesophageal reflux disease)   . Headache   . High cholesterol   . DVT (deep venous thrombosis), right 2005    "dry blood; after 8 foot fall"  . Chronic bronchitis     "get it basically 3 times/yr"  . Blood transfusion 1960  . H/O hiatal hernia   . Rheumatoid arthritis   . Concussion w/o coma 03/31/2004    "even now has times when she's not able to comprehend" (05/08/12)  . Atrial fibrillation   . Pneumonia ~ 2010 AND 2013  . Bladder cancer dx'd 2011  . Diverticulitis     Past Surgical History  Procedure Laterality Date  . Transurethral resection of bladder tumor  11/11/2011    Procedure: TRANSURETHRAL RESECTION OF BLADDER TUMOR (TURBT);  Surgeon: Anner Crete;  Location: [facility];  Service: Urology;  Laterality: N/A;  Cysto, Bladder Biopsy, TURBT with Gyrus,   . Cystoscopy  11/11/2011    Procedure: CYSTOSCOPY;  Surgeon: Anner Crete;  Location: [facility];  Service: Urology;  Laterality: N/A;  . Bladder lift  YRS AGO  . Bladder cancer  2010; 2009    "for tumors on surface of bladder"  . Knee  arthroscopy  2005    right; S/P fall  . Cataract extraction w/ intraocular lens  implant, bilateral Bilateral 1992  . Back surgery  1976 UPPER BACK    tumor removed-benign  . Tonsillectomy  1952  . Breast cyst excision  YRS AGO    2 cysts removed-left breast-benign  . Cardiac catheterization  1980's  . Abdominal hysterectomy  1960    partial hysterectomy  . Left thumb  benign tumor removed  1980'S    No prescriptions prior to admission   Allergies  Allergen Reactions  . Contrast Media  (Iodinated Diagnostic Agents) Hives  . Penicillins Rash    "haven't had it in years"    History  Substance Use Topics  . Smoking status: Former Smoker -- 1.00 packs/day for 50 years    Types: Cigarettes    Quit date: 11/07/1982  . Smokeless tobacco: Not on file  . Alcohol Use: 4.2 oz/week    7 Glasses of wine per week    Family History  Problem Relation Age of Onset  . Anesthesia problems Daughter      Review of Systems  Constitutional: Negative.   HENT: Positive for hearing loss.   Eyes: Negative.   Respiratory: Positive for wheezing.   Cardiovascular: Negative.   Gastrointestinal: Positive for heartburn and constipation.  Genitourinary: Positive for urgency and frequency.  Musculoskeletal: Positive for back pain and joint pain.  Skin: Positive for itching.  Neurological: Negative.   Endo/Heme/Allergies: Negative.   Psychiatric/Behavioral: Negative.     Objective:  Physical Exam  Constitutional: She is oriented to person, place, and time. She appears well-developed and well-nourished.  HENT:  Head: Normocephalic and atraumatic.  Mouth/Throat: Oropharynx is clear and moist. She has dentures.  Eyes: Pupils are equal, round, and reactive to light.  Neck: Neck supple. No JVD present. No tracheal deviation present. No thyromegaly present.  Cardiovascular: Normal rate, regular rhythm, normal heart sounds and intact distal pulses.   Respiratory: Effort normal. No stridor. No respiratory distress.  GI: Soft. There is no tenderness. There is no guarding.  Musculoskeletal:       Right knee: She exhibits decreased range of motion, swelling, deformity (valgus deformity) and bony tenderness. She exhibits no effusion, no ecchymosis, no laceration and no erythema. Tenderness found.  Lymphadenopathy:    She has no cervical adenopathy.  Neurological: She is alert and oriented to person, place, and time.  Skin: Skin is warm and dry.  Psychiatric: She has a normal mood and affect.     Labs:  Estimated body mass index is 27.89 kg/(m^2) as calculated from the following:   Height as of 05/08/12: 5\' 3"  (1.6 m).   Weight as of 05/08/12: 71.4 kg (157 lb 6.5 oz).   Imaging Review Plain radiographs demonstrate severe degenerative joint disease of the right knee(s). The overall alignment isneutral. The bone quality appears to be good for age and reported activity level.  Assessment/Plan:  End stage arthritis, right knee   The patient history, physical examination, clinical judgment of the provider and imaging studies are consistent with end stage degenerative joint disease of the right knee(s) and total knee arthroplasty is deemed medically necessary. The treatment options including medical management, injection therapy arthroscopy and arthroplasty were discussed at length. The risks and benefits of total knee arthroplasty were presented and reviewed. The risks due to aseptic loosening, infection, stiffness, patella tracking problems, thromboembolic complications and other imponderables were discussed. The patient acknowledged the explanation, agreed to proceed with the plan  and consent was signed. Patient is being admitted for inpatient treatment for surgery, pain control, PT, OT, prophylactic antibiotics, VTE prophylaxis, progressive ambulation and ADL's and discharge planning. The patient is planning to be discharged to skilled nursing facility.    Anastasio Auerbach    PAC  02/04/2013, 5:34 PM

## 2013-02-05 ENCOUNTER — Inpatient Hospital Stay (HOSPITAL_COMMUNITY): Payer: Medicare Other | Admitting: Anesthesiology

## 2013-02-05 ENCOUNTER — Encounter (HOSPITAL_COMMUNITY)
Admission: RE | Disposition: A | Discharge status: Skilled Nursing Facility | Payer: Self-pay | Source: Ambulatory Visit | Attending: Orthopedic Surgery

## 2013-02-05 ENCOUNTER — Encounter (HOSPITAL_COMMUNITY): Payer: Self-pay | Admitting: *Deleted

## 2013-02-05 ENCOUNTER — Encounter (HOSPITAL_COMMUNITY): Payer: Self-pay | Admitting: Anesthesiology

## 2013-02-05 ENCOUNTER — Inpatient Hospital Stay (HOSPITAL_COMMUNITY)
Admission: RE | Admit: 2013-02-05 | Discharge: 2013-02-08 | DRG: 470 | Disposition: A | Discharge status: Skilled Nursing Facility | Payer: Medicare Other | Source: Ambulatory Visit | Attending: Orthopedic Surgery | Admitting: Orthopedic Surgery

## 2013-02-05 DIAGNOSIS — I10 Essential (primary) hypertension: Secondary | ICD-10-CM | POA: Diagnosis present

## 2013-02-05 DIAGNOSIS — R29818 Other symptoms and signs involving the nervous system: Secondary | ICD-10-CM | POA: Diagnosis not present

## 2013-02-05 DIAGNOSIS — M25569 Pain in unspecified knee: Secondary | ICD-10-CM | POA: Diagnosis not present

## 2013-02-05 DIAGNOSIS — Z86718 Personal history of other venous thrombosis and embolism: Secondary | ICD-10-CM | POA: Diagnosis not present

## 2013-02-05 DIAGNOSIS — K219 Gastro-esophageal reflux disease without esophagitis: Secondary | ICD-10-CM | POA: Diagnosis present

## 2013-02-05 DIAGNOSIS — Z471 Aftercare following joint replacement surgery: Secondary | ICD-10-CM | POA: Diagnosis not present

## 2013-02-05 DIAGNOSIS — R262 Difficulty in walking, not elsewhere classified: Secondary | ICD-10-CM | POA: Diagnosis not present

## 2013-02-05 DIAGNOSIS — Z8551 Personal history of malignant neoplasm of bladder: Secondary | ICD-10-CM

## 2013-02-05 DIAGNOSIS — R5381 Other malaise: Secondary | ICD-10-CM | POA: Diagnosis not present

## 2013-02-05 DIAGNOSIS — M1710 Unilateral primary osteoarthritis, unspecified knee: Principal | ICD-10-CM | POA: Diagnosis present

## 2013-02-05 DIAGNOSIS — I82409 Acute embolism and thrombosis of unspecified deep veins of unspecified lower extremity: Secondary | ICD-10-CM | POA: Diagnosis not present

## 2013-02-05 DIAGNOSIS — Z96651 Presence of right artificial knee joint: Secondary | ICD-10-CM

## 2013-02-05 DIAGNOSIS — Z7901 Long term (current) use of anticoagulants: Secondary | ICD-10-CM | POA: Diagnosis not present

## 2013-02-05 DIAGNOSIS — M069 Rheumatoid arthritis, unspecified: Secondary | ICD-10-CM | POA: Diagnosis present

## 2013-02-05 DIAGNOSIS — E785 Hyperlipidemia, unspecified: Secondary | ICD-10-CM | POA: Diagnosis not present

## 2013-02-05 DIAGNOSIS — I4891 Unspecified atrial fibrillation: Secondary | ICD-10-CM | POA: Diagnosis present

## 2013-02-05 DIAGNOSIS — Z5189 Encounter for other specified aftercare: Secondary | ICD-10-CM | POA: Diagnosis not present

## 2013-02-05 DIAGNOSIS — R279 Unspecified lack of coordination: Secondary | ICD-10-CM | POA: Diagnosis not present

## 2013-02-05 DIAGNOSIS — I259 Chronic ischemic heart disease, unspecified: Secondary | ICD-10-CM | POA: Diagnosis not present

## 2013-02-05 DIAGNOSIS — Z96659 Presence of unspecified artificial knee joint: Secondary | ICD-10-CM | POA: Diagnosis not present

## 2013-02-05 DIAGNOSIS — J42 Unspecified chronic bronchitis: Secondary | ICD-10-CM | POA: Diagnosis not present

## 2013-02-05 DIAGNOSIS — I2510 Atherosclerotic heart disease of native coronary artery without angina pectoris: Secondary | ICD-10-CM | POA: Diagnosis present

## 2013-02-05 DIAGNOSIS — IMO0002 Reserved for concepts with insufficient information to code with codable children: Secondary | ICD-10-CM | POA: Diagnosis not present

## 2013-02-05 DIAGNOSIS — R059 Cough, unspecified: Secondary | ICD-10-CM | POA: Diagnosis not present

## 2013-02-05 DIAGNOSIS — Z79899 Other long term (current) drug therapy: Secondary | ICD-10-CM | POA: Diagnosis not present

## 2013-02-05 DIAGNOSIS — S8990XA Unspecified injury of unspecified lower leg, initial encounter: Secondary | ICD-10-CM | POA: Diagnosis not present

## 2013-02-05 HISTORY — PX: TOTAL KNEE ARTHROPLASTY: SHX125

## 2013-02-05 LAB — TYPE AND SCREEN

## 2013-02-05 LAB — PROTIME-INR: INR: 1.03 (ref 0.00–1.49)

## 2013-02-05 SURGERY — ARTHROPLASTY, KNEE, TOTAL
Anesthesia: Spinal | Site: Knee | Laterality: Right | Wound class: Clean

## 2013-02-05 MED ORDER — METHOCARBAMOL 100 MG/ML IJ SOLN
500.0000 mg | Freq: Four times a day (QID) | INTRAVENOUS | Status: DC | PRN
  Filled 2013-02-05: qty 5

## 2013-02-05 MED ORDER — LACTATED RINGERS IR SOLN
Status: DC | PRN
  Administered 2013-02-05: 1000 mL

## 2013-02-05 MED ORDER — SENNA 8.6 MG PO TABS
1.0000 | ORAL_TABLET | Freq: Two times a day (BID) | ORAL | Status: DC
  Administered 2013-02-05 – 2013-02-08 (×6): 8.6 mg via ORAL
  Filled 2013-02-05 (×6): qty 1

## 2013-02-05 MED ORDER — METOPROLOL SUCCINATE ER 50 MG PO TB24
50.0000 mg | ORAL_TABLET | Freq: Every day | ORAL | Status: DC
  Administered 2013-02-05 – 2013-02-07 (×3): 50 mg via ORAL
  Filled 2013-02-05 (×5): qty 1

## 2013-02-05 MED ORDER — BUPIVACAINE IN DEXTROSE 0.75-8.25 % IT SOLN
INTRATHECAL | Status: DC | PRN
  Administered 2013-02-05: 1.4 mL via INTRATHECAL

## 2013-02-05 MED ORDER — ZOLPIDEM TARTRATE 5 MG PO TABS
5.0000 mg | ORAL_TABLET | Freq: Every evening | ORAL | Status: DC | PRN
  Administered 2013-02-05: 5 mg via ORAL
  Filled 2013-02-05: qty 1

## 2013-02-05 MED ORDER — MIDAZOLAM HCL 5 MG/5ML IJ SOLN
INTRAMUSCULAR | Status: DC | PRN
  Administered 2013-02-05: 0.5 mg via INTRAVENOUS

## 2013-02-05 MED ORDER — HYDROMORPHONE HCL PF 1 MG/ML IJ SOLN
0.2000 mg | INTRAMUSCULAR | Status: DC | PRN
  Administered 2013-02-05 (×2): 0.5 mg via INTRAVENOUS
  Filled 2013-02-05: qty 1

## 2013-02-05 MED ORDER — CLINDAMYCIN PHOSPHATE 600 MG/50ML IV SOLN
600.0000 mg | Freq: Four times a day (QID) | INTRAVENOUS | Status: AC
  Administered 2013-02-05 – 2013-02-06 (×2): 600 mg via INTRAVENOUS
  Filled 2013-02-05 (×2): qty 50

## 2013-02-05 MED ORDER — SPIRONOLACTONE 25 MG PO TABS
25.0000 mg | ORAL_TABLET | Freq: Every day | ORAL | Status: DC
  Administered 2013-02-06 – 2013-02-08 (×3): 25 mg via ORAL
  Filled 2013-02-05 (×3): qty 1

## 2013-02-05 MED ORDER — MENTHOL 3 MG MT LOZG
1.0000 | LOZENGE | OROMUCOSAL | Status: DC | PRN
  Filled 2013-02-05: qty 9

## 2013-02-05 MED ORDER — DEXAMETHASONE SODIUM PHOSPHATE 10 MG/ML IJ SOLN
10.0000 mg | Freq: Once | INTRAMUSCULAR | Status: AC
  Administered 2013-02-05: 10 mg via INTRAVENOUS

## 2013-02-05 MED ORDER — FUROSEMIDE 40 MG PO TABS
40.0000 mg | ORAL_TABLET | Freq: Every day | ORAL | Status: DC
  Administered 2013-02-07 – 2013-02-08 (×2): 40 mg via ORAL
  Filled 2013-02-05 (×3): qty 1

## 2013-02-05 MED ORDER — WARFARIN SODIUM 5 MG PO TABS
5.0000 mg | ORAL_TABLET | Freq: Once | ORAL | Status: AC
  Administered 2013-02-05: 5 mg via ORAL
  Filled 2013-02-05: qty 1

## 2013-02-05 MED ORDER — HYDROMORPHONE HCL PF 1 MG/ML IJ SOLN
INTRAMUSCULAR | Status: AC
  Filled 2013-02-05: qty 1

## 2013-02-05 MED ORDER — DOCUSATE SODIUM 100 MG PO CAPS
100.0000 mg | ORAL_CAPSULE | Freq: Two times a day (BID) | ORAL | Status: DC
  Administered 2013-02-05 – 2013-02-08 (×6): 100 mg via ORAL

## 2013-02-05 MED ORDER — ACETAMINOPHEN 10 MG/ML IV SOLN
INTRAVENOUS | Status: DC | PRN
  Administered 2013-02-05: 1000 mg via INTRAVENOUS

## 2013-02-05 MED ORDER — ENOXAPARIN SODIUM 30 MG/0.3ML ~~LOC~~ SOLN
30.0000 mg | Freq: Two times a day (BID) | SUBCUTANEOUS | Status: DC
  Administered 2013-02-06 – 2013-02-08 (×5): 30 mg via SUBCUTANEOUS
  Filled 2013-02-05 (×7): qty 0.3

## 2013-02-05 MED ORDER — LACTATED RINGERS IV SOLN
INTRAVENOUS | Status: DC
  Administered 2013-02-05: 15:00:00 via INTRAVENOUS
  Administered 2013-02-05: 1000 mL via INTRAVENOUS

## 2013-02-05 MED ORDER — DILTIAZEM HCL ER BEADS 240 MG PO CP24
240.0000 mg | ORAL_CAPSULE | Freq: Every morning | ORAL | Status: DC
  Administered 2013-02-06 – 2013-02-08 (×3): 240 mg via ORAL
  Filled 2013-02-05 (×3): qty 1

## 2013-02-05 MED ORDER — HYDROMORPHONE HCL PF 1 MG/ML IJ SOLN: 0.2500 mg | INTRAMUSCULAR | Status: DC | PRN

## 2013-02-05 MED ORDER — ONDANSETRON HCL 4 MG PO TABS
4.0000 mg | ORAL_TABLET | Freq: Four times a day (QID) | ORAL | Status: DC | PRN
  Administered 2013-02-08: 4 mg via ORAL
  Filled 2013-02-05: qty 1

## 2013-02-05 MED ORDER — ALUMINUM HYDROXIDE GEL 320 MG/5ML PO SUSP
15.0000 mL | ORAL | Status: DC | PRN
  Filled 2013-02-05: qty 30

## 2013-02-05 MED ORDER — PANTOPRAZOLE SODIUM 40 MG PO TBEC
40.0000 mg | DELAYED_RELEASE_TABLET | Freq: Every day | ORAL | Status: DC
  Administered 2013-02-06 – 2013-02-08 (×3): 40 mg via ORAL
  Filled 2013-02-05 (×3): qty 1

## 2013-02-05 MED ORDER — SODIUM CHLORIDE 0.9 % IV SOLN
INTRAVENOUS | Status: DC
  Administered 2013-02-05: 23:00:00 via INTRAVENOUS
  Filled 2013-02-05 (×8): qty 1000

## 2013-02-05 MED ORDER — METHOCARBAMOL 500 MG PO TABS
500.0000 mg | ORAL_TABLET | Freq: Four times a day (QID) | ORAL | Status: DC | PRN
  Administered 2013-02-05 – 2013-02-08 (×5): 500 mg via ORAL
  Filled 2013-02-05 (×5): qty 1

## 2013-02-05 MED ORDER — HYDROCODONE-ACETAMINOPHEN 7.5-325 MG PO TABS
1.0000 | ORAL_TABLET | ORAL | Status: DC
  Administered 2013-02-05 (×2): 2 via ORAL
  Administered 2013-02-06: 1 via ORAL
  Administered 2013-02-06 (×3): 2 via ORAL
  Administered 2013-02-06 (×2): 1 via ORAL
  Administered 2013-02-07: 2 via ORAL
  Administered 2013-02-07: 1 via ORAL
  Administered 2013-02-07 (×3): 2 via ORAL
  Administered 2013-02-07: 1 via ORAL
  Administered 2013-02-08 (×3): 2 via ORAL
  Filled 2013-02-05: qty 2
  Filled 2013-02-05: qty 1
  Filled 2013-02-05 (×4): qty 2
  Filled 2013-02-05: qty 1
  Filled 2013-02-05 (×3): qty 2
  Filled 2013-02-05 (×2): qty 1
  Filled 2013-02-05: qty 2
  Filled 2013-02-05: qty 1
  Filled 2013-02-05 (×2): qty 2
  Filled 2013-02-05: qty 1
  Filled 2013-02-05: qty 2

## 2013-02-05 MED ORDER — FERROUS SULFATE 325 (65 FE) MG PO TABS
325.0000 mg | ORAL_TABLET | Freq: Three times a day (TID) | ORAL | Status: DC
  Administered 2013-02-06 – 2013-02-08 (×7): 325 mg via ORAL
  Filled 2013-02-05 (×10): qty 1

## 2013-02-05 MED ORDER — PHENOL 1.4 % MT LIQD
1.0000 | OROMUCOSAL | Status: DC | PRN
  Filled 2013-02-05: qty 177

## 2013-02-05 MED ORDER — DEXAMETHASONE SODIUM PHOSPHATE 10 MG/ML IJ SOLN
10.0000 mg | Freq: Once | INTRAMUSCULAR | Status: AC
  Administered 2013-02-06: 10 mg via INTRAVENOUS
  Filled 2013-02-05: qty 1

## 2013-02-05 MED ORDER — SODIUM CHLORIDE 0.9 % IJ SOLN
INTRAMUSCULAR | Status: DC | PRN
  Administered 2013-02-05: 15:00:00

## 2013-02-05 MED ORDER — VITAMIN D3 25 MCG (1000 UNIT) PO TABS
1000.0000 [IU] | ORAL_TABLET | Freq: Every day | ORAL | Status: DC
  Administered 2013-02-06 – 2013-02-08 (×3): 1000 [IU] via ORAL
  Filled 2013-02-05 (×3): qty 1

## 2013-02-05 MED ORDER — PROMETHAZINE HCL 25 MG/ML IJ SOLN: 6.2500 mg | INTRAMUSCULAR | Status: DC | PRN

## 2013-02-05 MED ORDER — CLINDAMYCIN PHOSPHATE 900 MG/50ML IV SOLN
900.0000 mg | INTRAVENOUS | Status: AC
  Administered 2013-02-05: 900 mg via INTRAVENOUS

## 2013-02-05 MED ORDER — POLYETHYLENE GLYCOL 3350 17 G PO PACK: 17.0000 g | PACK | Freq: Every day | ORAL | Status: DC | PRN

## 2013-02-05 MED ORDER — ONDANSETRON HCL 4 MG/2ML IJ SOLN
4.0000 mg | Freq: Four times a day (QID) | INTRAMUSCULAR | Status: DC | PRN
  Administered 2013-02-06: 4 mg via INTRAVENOUS
  Filled 2013-02-05: qty 2

## 2013-02-05 MED ORDER — FENTANYL CITRATE 0.05 MG/ML IJ SOLN
INTRAMUSCULAR | Status: DC | PRN
  Administered 2013-02-05 (×2): 25 ug via INTRAVENOUS
  Administered 2013-02-05: 50 ug via INTRAVENOUS

## 2013-02-05 MED ORDER — DIPHENHYDRAMINE HCL 12.5 MG/5ML PO ELIX: 25.0000 mg | ORAL_SOLUTION | Freq: Four times a day (QID) | ORAL | Status: DC | PRN

## 2013-02-05 MED ORDER — PHENYLEPHRINE HCL 10 MG/ML IJ SOLN
20.0000 mg | INTRAVENOUS | Status: DC | PRN
  Administered 2013-02-05: 5 ug/min via INTRAVENOUS

## 2013-02-05 MED ORDER — BUPIVACAINE LIPOSOME 1.3 % IJ SUSP
20.0000 mL | Freq: Once | INTRAMUSCULAR | Status: DC
  Filled 2013-02-05: qty 20

## 2013-02-05 MED ORDER — PROPOFOL INFUSION 10 MG/ML OPTIME
INTRAVENOUS | Status: DC | PRN
  Administered 2013-02-05: 25 ug/kg/min via INTRAVENOUS

## 2013-02-05 MED ORDER — WARFARIN - PHARMACIST DOSING INPATIENT
Freq: Every day | Status: DC
  Administered 2013-02-07: 18:00:00

## 2013-02-05 SURGICAL SUPPLY — 55 items
BAG ZIPLOCK 12X15 (MISCELLANEOUS) ×2 IMPLANT
BANDAGE ELASTIC 6 VELCRO ST LF (GAUZE/BANDAGES/DRESSINGS) ×2 IMPLANT
BANDAGE ESMARK 6X9 LF (GAUZE/BANDAGES/DRESSINGS) ×1 IMPLANT
BLADE SAW SGTL 13.0X1.19X90.0M (BLADE) ×2 IMPLANT
BNDG ESMARK 6X9 LF (GAUZE/BANDAGES/DRESSINGS) ×2
BOWL SMART MIX CTS (DISPOSABLE) ×2 IMPLANT
CEMENT HV SMART SET (Cement) ×4 IMPLANT
CLOTH BEACON ORANGE TIMEOUT ST (SAFETY) ×2 IMPLANT
CUFF TOURN SGL QUICK 34 (TOURNIQUET CUFF) ×1
CUFF TRNQT CYL 34X4X40X1 (TOURNIQUET CUFF) ×1 IMPLANT
DECANTER SPIKE VIAL GLASS SM (MISCELLANEOUS) ×2 IMPLANT
DERMABOND ADVANCED (GAUZE/BANDAGES/DRESSINGS) ×1
DERMABOND ADVANCED .7 DNX12 (GAUZE/BANDAGES/DRESSINGS) ×1 IMPLANT
DRAPE EXTREMITY T 121X128X90 (DRAPE) ×2 IMPLANT
DRAPE POUCH INSTRU U-SHP 10X18 (DRAPES) ×2 IMPLANT
DRAPE U-SHAPE 47X51 STRL (DRAPES) ×2 IMPLANT
DRSG AQUACEL AG ADV 3.5X10 (GAUZE/BANDAGES/DRESSINGS) ×2 IMPLANT
DRSG TEGADERM 4X4.75 (GAUZE/BANDAGES/DRESSINGS) ×2 IMPLANT
DURAPREP 26ML APPLICATOR (WOUND CARE) ×2 IMPLANT
ELECT REM PT RETURN 9FT ADLT (ELECTROSURGICAL) ×2
ELECTRODE REM PT RTRN 9FT ADLT (ELECTROSURGICAL) ×1 IMPLANT
EVACUATOR 1/8 PVC DRAIN (DRAIN) ×2 IMPLANT
FACESHIELD LNG OPTICON STERILE (SAFETY) ×10 IMPLANT
GAUZE SPONGE 2X2 8PLY STRL LF (GAUZE/BANDAGES/DRESSINGS) ×1 IMPLANT
GLOVE BIOGEL PI IND STRL 7.5 (GLOVE) ×1 IMPLANT
GLOVE BIOGEL PI IND STRL 8 (GLOVE) ×1 IMPLANT
GLOVE BIOGEL PI INDICATOR 7.5 (GLOVE) ×1
GLOVE BIOGEL PI INDICATOR 8 (GLOVE) ×1
GLOVE ECLIPSE 8.0 STRL XLNG CF (GLOVE) ×2 IMPLANT
GLOVE ORTHO TXT STRL SZ7.5 (GLOVE) ×4 IMPLANT
GOWN BRE IMP PREV XXLGXLNG (GOWN DISPOSABLE) ×4 IMPLANT
GOWN STRL NON-REIN LRG LVL3 (GOWN DISPOSABLE) ×2 IMPLANT
HANDPIECE INTERPULSE COAX TIP (DISPOSABLE) ×1
IMMOBILIZER KNEE 20 (SOFTGOODS)
IMMOBILIZER KNEE 20 THIGH 36 (SOFTGOODS) IMPLANT
KIT BASIN OR (CUSTOM PROCEDURE TRAY) ×2 IMPLANT
MANIFOLD NEPTUNE II (INSTRUMENTS) ×2 IMPLANT
NDL SAFETY ECLIPSE 18X1.5 (NEEDLE) ×1 IMPLANT
NEEDLE HYPO 18GX1.5 SHARP (NEEDLE) ×1
NS IRRIG 1000ML POUR BTL (IV SOLUTION) ×4 IMPLANT
PACK TOTAL JOINT (CUSTOM PROCEDURE TRAY) ×2 IMPLANT
POSITIONER SURGICAL ARM (MISCELLANEOUS) ×2 IMPLANT
SET HNDPC FAN SPRY TIP SCT (DISPOSABLE) ×1 IMPLANT
SET PAD KNEE POSITIONER (MISCELLANEOUS) ×2 IMPLANT
SPONGE GAUZE 2X2 STER 10/PKG (GAUZE/BANDAGES/DRESSINGS) ×1
SUCTION FRAZIER 12FR DISP (SUCTIONS) ×2 IMPLANT
SUT MNCRL AB 4-0 PS2 18 (SUTURE) ×2 IMPLANT
SUT VIC AB 1 CT1 36 (SUTURE) ×6 IMPLANT
SUT VIC AB 2-0 CT1 27 (SUTURE) ×3
SUT VIC AB 2-0 CT1 TAPERPNT 27 (SUTURE) ×3 IMPLANT
SYR 50ML LL SCALE MARK (SYRINGE) ×2 IMPLANT
TOWEL OR 17X26 10 PK STRL BLUE (TOWEL DISPOSABLE) ×4 IMPLANT
TRAY FOLEY CATH 14FRSI W/METER (CATHETERS) ×2 IMPLANT
WATER STERILE IRR 1500ML POUR (IV SOLUTION) ×2 IMPLANT
WRAP KNEE MAXI GEL POST OP (GAUZE/BANDAGES/DRESSINGS) ×2 IMPLANT

## 2013-02-05 NOTE — Progress Notes (Signed)
ANTICOAGULATION CONSULT NOTE - Initial Consult  Pharmacy Consult for Warfarin Indication: VTE prophylaxis  Allergies  Allergen Reactions  . Contrast Media (Iodinated Diagnostic Agents) Hives  . Penicillins Rash    "haven't had it in years"    Patient Measurements: Height: 5\' 5"  (165.1 cm) Weight: 162 lb (73.483 kg) IBW/kg (Calculated) : 57kg   Vital Signs: Temp: 97.5 F (36.4 C) (02/24 1700) Temp src: Oral (02/24 1209) BP: 119/77 mmHg (02/24 1700) Pulse Rate: 67 (02/24 1700)  Labs:  Recent Labs  02/05/13 1200  LABPROT 13.4  INR 1.03    Estimated Creatinine Clearance: 47.9 ml/min (by C-G formula based on Cr of 0.76).   Medical History: Past Medical History  Diagnosis Date  . Coronary artery disease   . GERD (gastroesophageal reflux disease)   . Headache   . High cholesterol   . DVT (deep venous thrombosis), right 2005    "dry blood; after 8 foot fall"  . Chronic bronchitis     "get it basically 3 times/yr"  . Blood transfusion 1960  . H/O hiatal hernia   . Rheumatoid arthritis   . Concussion w/o coma 03/31/2004    "even now has times when she's not able to comprehend" (05/08/12)  . Atrial fibrillation   . Pneumonia ~ 2010 AND 2013  . Bladder cancer dx'd 2011  . Diverticulitis     Medications:  Scheduled:  . [START ON 02/06/2013] cholecalciferol  1,000 Units Oral Daily  . clindamycin (CLEOCIN) IV  600 mg Intravenous Q6H  . [COMPLETED] clindamycin (CLEOCIN) IV  900 mg Intravenous On Call to OR  . [COMPLETED] dexamethasone  10 mg Intravenous Once  . [START ON 02/06/2013] dexamethasone  10 mg Intravenous Once  . [START ON 02/06/2013] diltiazem  240 mg Oral q morning - 10a  . docusate sodium  100 mg Oral BID  . [START ON 02/06/2013] enoxaparin (LOVENOX) injection  30 mg Subcutaneous Q12H  . [START ON 02/06/2013] ferrous sulfate  325 mg Oral TID PC  . [START ON 02/06/2013] furosemide  40 mg Oral Daily  . HYDROcodone-acetaminophen  1-2 tablet Oral Q4H  .  HYDROmorphone      . metoprolol succinate  50 mg Oral QHS  . pantoprazole  40 mg Oral Daily  . senna  1 tablet Oral BID  . spironolactone  25 mg Oral Daily  . [DISCONTINUED] bupivacaine liposome  20 mL Infiltration Once   Infusions:  . sodium chloride 0.9 % 1,000 mL with potassium chloride 10 mEq infusion    . [DISCONTINUED] lactated ringers     PRN: aluminum hydroxide, diphenhydrAMINE, HYDROmorphone (DILAUDID) injection, menthol-cetylpyridinium, methocarbamol (ROBAXIN) IV, methocarbamol, ondansetron (ZOFRAN) IV, ondansetron, phenol, polyethylene glycol, zolpidem, [DISCONTINUED] bupivacaine liposome (EXPAREL 1.3 %) with 0.9 % sodium chloride inj, [DISCONTINUED]  HYDROmorphone (DILAUDID) injection, [DISCONTINUED] lactated ringers, [DISCONTINUED] promethazine  Assessment: 79 yo F with right knee OA, now S/P Right TKA Patient has been on chronic anticoagulation with hx of atrial fibrillation and past DVT Goal of Therapy:  INR 2-3 Monitor platelets by anticoagulation protocol: Yes   Plan:  1. ) Coumadin 5mg  po x1 today 2. ) Daily PT/INR and CBC starting 2/25  Loletta Specter 02/05/2013,6:01 PM

## 2013-02-05 NOTE — Op Note (Signed)
NAME:  KALYANI MAEDA                      MEDICAL RECORD NO.:  [id number]                             FACILITY:  [facility]      PHYSICIAN:  Madlyn Frankel. Charlann Boxer, M.D.  DATE OF BIRTH:  22-May-1921      DATE OF PROCEDURE:  02/05/2013                                     OPERATIVE REPORT         PREOPERATIVE DIAGNOSIS:  Right knee osteoarthritis.      POSTOPERATIVE DIAGNOSIS:  Right knee osteoarthritis.      FINDINGS:  The patient was noted to have complete loss of cartilage and   bone-on-bone arthritis with associated osteophytes in all three compartments of   the knee worse laterally with significant correctable valgus deformitywith a significant synovitis and associated effusion.      PROCEDURE:  Right total knee replacement.      COMPONENTS USED:  DePuy rotating platform posterior stabilized knee   system, a size 3 femur, 2.5 tibia, 12.5 mm PS insert, and 38 patellar   button.      SURGEON:  Madlyn Frankel. Charlann Boxer, M.D.      ASSISTANT:  Leilani Able, PA-C.      ANESTHESIA:  Spinal.      SPECIMENS:  None.      COMPLICATION:  None.      DRAINS:  One Hemovac.  EBL: <200cc      TOURNIQUET TIME:   Total Tourniquet Time Documented: Thigh (Right) - 30 minutes Total: Thigh (Right) - 30 minutes  .      The patient was stable to the recovery room.      INDICATION FOR PROCEDURE:  DEMETRICA ZIPP is a 79 y.o. female patient of   mine.  The patient had been seen, evaluated, and treated conservatively in the   office with medication, activity modification, and injections.  The patient had   radiographic changes of bone-on-bone arthritis with endplate sclerosis and osteophytes noted.      The patient failed conservative measures including medication, injections, and activity modification, and at this point was ready for more definitive measures.   Based on the radiographic changes and failed conservative measures, the patient   decided to proceed with total knee replacement.  Risks  of infection,   DVT, component failure, need for revision surgery, postop course, and   expectations were all   discussed and reviewed.  Consent was obtained for benefit of pain   relief.      PROCEDURE IN DETAIL:  The patient was brought to the operative theater.   Once adequate anesthesia, preoperative antibiotics, 2 gm of Ancef administered, the patient was positioned supine with the right thigh tourniquet placed.  The  right lower extremity was prepped and draped in sterile fashion.  A time-   out was performed identifying the patient, planned procedure, and   extremity.      The right lower extremity was placed in the [facility] leg holder.  The leg was   exsanguinated, tourniquet elevated to 250 mmHg.  A midline incision was   made followed by median parapatellar arthrotomy.  Following initial  exposure, attention was first directed to the patella.  Precut   measurement was noted to be 22 mm.  I resected down to 13 mm and used a   38 patellar button to restore patellar height as well as cover the cut   surface.      The lug holes were drilled and a metal shim was placed to protect the   patella from retractors and saw blades.      At this point, attention was now directed to the femur.  The femoral   canal was opened with a drill, irrigated to try to prevent fat emboli.  An   intramedullary rod was passed at 5 degrees valgus, 10 mm of bone was   resected off the distal femur.  Following this resection, the tibia was   subluxated anteriorly.  Using the extramedullary guide, 4mm of bone was resected off   the proximal lateral tibia.  We confirmed the gap would be   stable medially and laterally with a 10 mm insert as well as confirmed   the cut was perpendicular in the coronal plane, checking with an alignment rod.      Once this was done, I sized the femur to be a size 3 in the anterior-   posterior dimension, chose a standard component based on medial and   lateral dimension.   The size 3 rotation block was then pinned in   position anterior referenced using the C-clamp to set rotation.  The   anterior, posterior, and  chamfer cuts were made without difficulty nor   notching making certain that I was along the anterior cortex to help   with flexion gap stability.      The final box cut was made off the lateral aspect of distal femur.      At this point, the tibia was sized to be a size 2.5, the size 2.5 tray was   then pinned in position through the medial third of the tubercle,   drilled, and keel punched.  Trial reduction was now carried with a 3 femur,  2.5 tibia, a 12.5 mm insert, and the 38 patella botton.  The knee was brought to   extension, full extension with good flexion stability with the patella   tracking through the trochlea without application of pressure.  Given   all these findings, the trial components removed.  Final components were   opened and cement was mixed.  The knee was irrigated with normal saline   solution and pulse lavage.  The synovial lining was   then injected with 0.25% Marcaine with epinephrine and 1 cc of Toradol,   total of 61 cc.      The knee was irrigated.  Final implants were then cemented onto clean and   dried cut surfaces of bone with the knee brought to extension with a 12.5 mm trial insert.      Once the cement had fully cured, the excess cement was removed   throughout the knee.  I confirmed I was satisfied with the range of   motion and stability, and the final 12.5 mm PS insert was chosen.  It was   placed into the knee.      The tourniquet had been let down at 29 minutes.  No significant   hemostasis required.  The medium Hemovac drain was placed deep.  The   extensor mechanism was then reapproximated using #1 Vicryl with the knee   in flexion.  The   remaining wound was closed with 2-0 Vicryl and running 4-0 Monocryl.   The knee was cleaned, dried, dressed sterilely using Dermabond and   Aquacel dressing.   Drain site dressed separately.  The patient was then   brought to recovery room in stable condition, tolerating the procedure   well.   Please note that Physician Assistant, Leilani Able, was present for the entirety of the case, and was utilized for pre-operative positioning, peri-operative retractor management, general facilitation of the procedure.  He was also utilized for primary wound closure at the end of the case.              Madlyn Frankel Charlann Boxer, M.D.

## 2013-02-05 NOTE — Interval H&P Note (Signed)
History and Physical Interval Note:  02/05/2013 1:32 PM  [name]  has presented today for surgery, with the diagnosis of RIGHT KNEE OA  The various methods of treatment have been discussed with the patient and family. After consideration of risks, benefits and other options for treatment, the patient has consented to  Procedure(s): TOTAL KNEE ARTHROPLASTY (Right) as a surgical intervention .  The patient's history has been reviewed, patient examined, no change in status, stable for surgery.  I have reviewed the patient's chart and labs.  Questions were answered to the patient's satisfaction.     Shelda Pal

## 2013-02-05 NOTE — Anesthesia Preprocedure Evaluation (Addendum)
Anesthesia Evaluation  Patient identified by MRN, date of birth, ID band Patient awake    Reviewed: Allergy & Precautions, H&P , NPO status , Patient's Chart, lab work & pertinent test results  Airway Mallampati: II TM Distance: >3 FB Neck ROM: Full    Dental no notable dental hx.    Pulmonary former smoker,  breath sounds clear to auscultation  Pulmonary exam normal       Cardiovascular hypertension, + CAD and + Peripheral Vascular Disease + dysrhythmias Atrial Fibrillation Rhythm:Regular Rate:Normal     Neuro/Psych negative neurological ROS  negative psych ROS   GI/Hepatic Neg liver ROS, GERD-  Medicated,  Endo/Other  negative endocrine ROS  Renal/GU negative Renal ROS  negative genitourinary   Musculoskeletal  (+) Arthritis -, Rheumatoid disorders,    Abdominal   Peds negative pediatric ROS (+)  Hematology negative hematology ROS (+)   Anesthesia Other Findings   Reproductive/Obstetrics negative OB ROS                           Anesthesia Physical Anesthesia Plan  ASA: III  Anesthesia Plan: Spinal   Post-op Pain Management:    Induction:   Airway Management Planned: Simple Face Mask  Additional Equipment:   Intra-op Plan:   Post-operative Plan:   Informed Consent: I have reviewed the patients History and Physical, chart, labs and discussed the procedure including the risks, benefits and alternatives for the proposed anesthesia with the patient or authorized representative who has indicated his/her understanding and acceptance.     Plan Discussed with: CRNA and Surgeon  Anesthesia Plan Comments:         Anesthesia Quick Evaluation

## 2013-02-05 NOTE — Transfer of Care (Signed)
Immediate Anesthesia Transfer of Care Note  Patient: [name]  Procedure(s) Performed: Procedure(s): TOTAL KNEE ARTHROPLASTY (Right)  Patient Location: PACU  Anesthesia Type:MAC and Spinal  Level of Consciousness: awake, alert , oriented and patient cooperative  Airway & Oxygen Therapy: Patient Spontanous Breathing and Patient connected to face mask oxygen  Post-op Assessment: Report given to PACU RN and Post -op Vital signs reviewed and stable  Post vital signs: Reviewed and stable  Complications: No apparent anesthesia complications

## 2013-02-05 NOTE — Anesthesia Procedure Notes (Signed)
Spinal Patient location during procedure: OR Staffing Performed by: anesthesiologist  Preanesthetic Checklist Completed: patient identified, site marked, surgical consent, pre-op evaluation, timeout performed, IV checked, risks and benefits discussed and monitors and equipment checked Spinal Block Patient position: sitting Prep: Betadine Patient monitoring: heart rate, continuous pulse ox and blood pressure Injection technique: single-shot Needle Needle type: Sprotte  Needle gauge: 22 G Needle length: 9 cm Additional Notes Expiration date of kit checked and confirmed. Patient tolerated procedure well, without complications.     

## 2013-02-05 NOTE — Plan of Care (Signed)
Problem: Consults Goal: Diagnosis- Total Joint Replacement Outcome: Completed/Met Date Met:  02/05/13 Primary Total Knee

## 2013-02-06 ENCOUNTER — Encounter (HOSPITAL_COMMUNITY): Payer: Self-pay | Admitting: Orthopedic Surgery

## 2013-02-06 LAB — BASIC METABOLIC PANEL
Chloride: 92 mEq/L — ABNORMAL LOW (ref 96–112)
GFR calc Af Amer: >90 mL/min (ref >90)
GFR calc non Af Amer: 82 mL/min — ABNORMAL LOW (ref >90)
Potassium: 3.8 mEq/L (ref 3.5–5.1)
Sodium: 125 mEq/L — ABNORMAL LOW (ref 135–145)

## 2013-02-06 LAB — PROTIME-INR
INR: 1.09 (ref 0.00–1.49)
Prothrombin Time: 14 seconds (ref 11.6–15.2)

## 2013-02-06 LAB — CBC
MCHC: 34.8 g/dL (ref 30.0–36.0)
RDW: 12.9 % (ref 11.5–15.5)
WBC: 8.5 10*3/uL (ref 4.0–10.5)

## 2013-02-06 MED ORDER — WARFARIN SODIUM 7.5 MG PO TABS
7.5000 mg | ORAL_TABLET | Freq: Once | ORAL | Status: AC
  Administered 2013-02-06: 7.5 mg via ORAL
  Filled 2013-02-06: qty 1

## 2013-02-06 NOTE — Progress Notes (Signed)
Patient ID: LIN HACKMANN, female   DOB: 23-Mar-1921, 79 y.o.   MRN: [id number] Subjective: 1 Day Post-Op Procedure(s) (LRB): TOTAL KNEE ARTHROPLASTY (Right)    Patient reports pain as mild.  Doing fine this am, not great sleep.  Happy to have procedure finally done  Objective:   VITALS:   Filed Vitals:   02/06/13 2134  BP: 114/64  Pulse: 85  Temp: 98.7 F (37.1 C)  Resp: 16    Neurovascular intact Incision: dressing C/D/I HV removed  LABS  Recent Labs  02/06/13 0420  HGB 9.8*  HCT 28.2*  WBC 8.5  PLT 177     Recent Labs  02/06/13 0420  NA 125*  K 3.8  BUN 8  CREATININE 0.53  GLUCOSE 229*     Recent Labs  02/05/13 1200 02/06/13 0420  INR 1.03 1.09     Assessment/Plan: 1 Day Post-Op Procedure(s) (LRB): TOTAL KNEE ARTHROPLASTY (Right)   Advance diet Up with therapy Discharge to SNF pending evaluation of available locations

## 2013-02-06 NOTE — Progress Notes (Signed)
Utilization review completed.  

## 2013-02-06 NOTE — Progress Notes (Signed)
ANTICOAGULATION CONSULT NOTE - Follow Up Consult  Pharmacy Consult for Warfarin Indication: VTE prophylaxis, hx of DVT and Afib  Allergies  Allergen Reactions  . Contrast Media (Iodinated Diagnostic Agents) Hives  . Penicillins Rash    "haven't had it in years"    Patient Measurements: Height: 5\' 5"  (165.1 cm) Weight: 162 lb (73.483 kg) IBW/kg (Calculated) : 57  Vital Signs: Temp: 98.3 F (36.8 C) (02/25 0947) Temp src: Oral (02/25 0947) BP: 102/53 mmHg (02/25 0947) Pulse Rate: 63 (02/25 0947)  Labs:  Recent Labs  02/05/13 1200 02/06/13 0420  HGB  --  9.8*  HCT  --  28.2*  PLT  --  177  LABPROT 13.4 14.0  INR 1.03 1.09  CREATININE  --  0.53    Estimated Creatinine Clearance: 47.9 ml/min (by C-G formula based on Cr of 0.53).   Medications:  Scheduled:  . cholecalciferol  1,000 Units Oral Daily  . [COMPLETED] clindamycin (CLEOCIN) IV  600 mg Intravenous Q6H  . [COMPLETED] clindamycin (CLEOCIN) IV  900 mg Intravenous On Call to OR  . [COMPLETED] dexamethasone  10 mg Intravenous Once  . dexamethasone  10 mg Intravenous Once  . diltiazem  240 mg Oral q morning - 10a  . docusate sodium  100 mg Oral BID  . enoxaparin (LOVENOX) injection  30 mg Subcutaneous Q12H  . ferrous sulfate  325 mg Oral TID PC  . furosemide  40 mg Oral Daily  . HYDROcodone-acetaminophen  1-2 tablet Oral Q4H  . [EXPIRED] HYDROmorphone      . metoprolol succinate  50 mg Oral QHS  . pantoprazole  40 mg Oral Daily  . senna  1 tablet Oral BID  . spironolactone  25 mg Oral Daily  . [COMPLETED] warfarin  5 mg Oral Once  . Warfarin - Pharmacist Dosing Inpatient   Does not apply q1800  . [DISCONTINUED] bupivacaine liposome  20 mL Infiltration Once   Infusions:  . sodium chloride 0.9 % 1,000 mL with potassium chloride 10 mEq infusion 20 mL/hr at 02/06/13 0733  . [DISCONTINUED] lactated ringers      Assessment: 89 yof on chronic warfarin PTA for hx of Afib and DVT.  She is now s/p right TKA  2/24.  Pharmacy was asked to resume warfarin dosing for VTE prophylaxis.  PTA dose was 5mg  daily, last dose taken on 01/31/13  Lovenox 30mg  SQ BID ordered per MD for warfarin bridge; d/c when INR >/= 1.8  INR (1.09) remains subtherapeutic, but increased after first postop dose.  CBC:  Hgb decreased to 9.8 and Plt remain wnl.  No bleeding reported/documented.  Goal of Therapy:  INR 2-3 Monitor platelets by anticoagulation protocol: Yes   Plan:   Warfarin 7.5mg  PO at 1800 x1  Daily INR, CBC  Lynann Beaver PharmD, BCPS Pager [phone] 02/06/2013 1:04 PM

## 2013-02-06 NOTE — Progress Notes (Signed)
Physical Therapy Treatment Patient Details Name: [name] MRN: [id number] DOB: Jan 14, 1921 Today's Date: 02/06/2013 Time: 9562-1308 PT Time Calculation (min): 17 min  PT Assessment / Plan / Recommendation Comments on Treatment Session       Follow Up Recommendations  SNF     Does the patient have the potential to tolerate intense rehabilitation     Barriers to Discharge        Equipment Recommendations  None recommended by PT    Recommendations for Other Services OT consult  Frequency 7X/week   Plan Discharge plan remains appropriate    Precautions / Restrictions Precautions Precautions: Knee;Fall Restrictions Weight Bearing Restrictions: No Other Position/Activity Restrictions: wbat   Pertinent Vitals/Pain     Mobility  Transfers Transfers: Sit to Stand;Stand to Sit Sit to Stand: 1: +2 Total assist Sit to Stand: Patient Percentage: 70% Stand to Sit: 1: +2 Total assist Stand to Sit: Patient Percentage: 70% Details for Transfer Assistance: cues for use of UEs and for LE management Ambulation/Gait Ambulation/Gait Assistance: 1: +2 Total assist Ambulation/Gait: Patient Percentage: 70% Ambulation Distance (Feet): 12 Feet Assistive device: Rolling walker Ambulation/Gait Assistance Details: cues for sequence, posture, and position from RW Gait Pattern: Step-to pattern General Gait Details: ltd by onset of nausea    Exercises     PT Diagnosis:    PT Problem List:   PT Treatment Interventions:     PT Goals Acute Rehab PT Goals PT Goal Formulation: With patient Time For Goal Achievement: 02/09/13 Potential to Achieve Goals: Good Pt will go Supine/Side to Sit: with supervision PT Goal: Supine/Side to Sit - Progress: Goal set today Pt will go Sit to Supine/Side: with supervision PT Goal: Sit to Supine/Side - Progress: Goal set today Pt will go Sit to Stand: with supervision PT Goal: Sit to Stand - Progress: Goal set today Pt will go Stand to Sit:  with supervision PT Goal: Stand to Sit - Progress: Goal set today Pt will Ambulate: 51 - 150 feet;with supervision;with rolling walker PT Goal: Ambulate - Progress: Goal set today  Visit Information  Last PT Received On: 02/06/13 Assistance Needed: +2    Subjective Data  Subjective: I guess I'm ready Patient Stated Goal: rehab and then home   Cognition  Cognition Overall Cognitive Status: Appears within functional limits for tasks assessed/performed Arousal/Alertness: Awake/alert Orientation Level: Appears intact for tasks assessed Behavior During Session: [facility] for tasks performed    Balance     End of Session PT - End of Session Equipment Utilized During Treatment: Gait belt;Left knee immobilizer Activity Tolerance: Patient limited by fatigue;Other (comment) (nausea) Patient left: in chair;with call bell/phone within reach;with family/visitor present Nurse Communication: Mobility status;Other (comment) (nausea)   GP     , 02/06/2013, 4:44 PM

## 2013-02-06 NOTE — Progress Notes (Signed)
OT Cancellation Note  Patient Details Name: [name] MRN: [id number] DOB: 04-27-1921   Cancelled Treatment:    Reason Eval/Treat Not Completed: Other (comment)  Will defer OT eval to SNF.  , 02/06/2013, 1:41 PM Marica Otter, OTR/L [phone] 02/06/2013

## 2013-02-06 NOTE — Progress Notes (Signed)
Clinical Social Work Department BRIEF PSYCHOSOCIAL ASSESSMENT 02/06/2013  Patient:  [name], [name]     Account Number:  [id number]     Admit date:  02/05/2013  Clinical Social Worker:  Candie Chroman  Date/Time:  02/06/2013 01:05 PM  Referred by:  Physician  Date Referred:  02/06/2013 Referred for  SNF Placement   Other Referral:   Interview type:   Other interview type:    PSYCHOSOCIAL DATA Living Status:  FAMILY Admitted from facility:   Level of care:   Primary support name:  Judi Cong Primary support relationship to patient:  CHILD, ADULT Degree of support available:   supportive    CURRENT CONCERNS Current Concerns  Post-Acute Placement   Other Concerns:    SOCIAL WORK ASSESSMENT / PLAN Pt is an 79 yr old female living at home prior to hospitalization. CSW met with pt and daughters to assist with d/c planning. Pt will need ST Rehab following hospital d/c. Pt / family are in agreement with this plan. SNF search has been initiated and bed offers provided. CSW will follow to assist with d/c planning to SNF.   Assessment/plan status:  Psychosocial Support/Ongoing Assessment of Needs Other assessment/ plan:   Information/referral to community resources:   SNF list provided.    PATIENT'S/FAMILY'S RESPONSE TO PLAN OF CARE: Pt / family feel ST Rehab is needed prior to returning home.   Adriana Razor LCSW [phone]

## 2013-02-06 NOTE — Progress Notes (Signed)
Physical Therapy Treatment Patient Details Name: FREDIA CHITTENDEN MRN: [id number] DOB: 07/06/1921 Today's Date: 02/06/2013 Time: 4098-1191 PT Time Calculation (min): 19 min  PT Assessment / Plan / Recommendation Comments on Treatment Session       Follow Up Recommendations  SNF     Does the patient have the potential to tolerate intense rehabilitation     Barriers to Discharge        Equipment Recommendations  None recommended by PT    Recommendations for Other Services OT consult  Frequency 7X/week   Plan Discharge plan remains appropriate    Precautions / Restrictions Precautions Precautions: Knee;Fall Restrictions Weight Bearing Restrictions: No Other Position/Activity Restrictions: wbat   Pertinent Vitals/Pain     Mobility  Bed Mobility Bed Mobility: Sit to Supine Sit to Supine: 4: Min assist;3: Mod assist Details for Bed Mobility Assistance: cues for sequence and use of L LE to self assist Transfers Transfers: Sit to Stand;Stand to Sit Sit to Stand: 1: +2 Total assist Sit to Stand: Patient Percentage: 70% Stand to Sit: 1: +2 Total assist Stand to Sit: Patient Percentage: 70% Details for Transfer Assistance: cues for use of UEs and for LE management Ambulation/Gait Ambulation/Gait Assistance: 1: +2 Total assist Ambulation/Gait: Patient Percentage: 70% Ambulation Distance (Feet): 34 Feet Assistive device: Rolling walker Ambulation/Gait Assistance Details: cues for posture, sequence, position from RW and increased UE WB Gait Pattern: Step-to pattern General Gait Details: ltd by onset of nausea    Exercises     PT Diagnosis:    PT Problem List:   PT Treatment Interventions:     PT Goals Acute Rehab PT Goals PT Goal Formulation: With patient Time For Goal Achievement: 02/09/13 Potential to Achieve Goals: Good Pt will go Supine/Side to Sit: with supervision PT Goal: Supine/Side to Sit - Progress: Progressing toward goal Pt will go Sit to  Supine/Side: with supervision PT Goal: Sit to Supine/Side - Progress: Progressing toward goal Pt will go Sit to Stand: with supervision PT Goal: Sit to Stand - Progress: Progressing toward goal Pt will go Stand to Sit: with supervision PT Goal: Stand to Sit - Progress: Progressing toward goal Pt will Ambulate: 51 - 150 feet;with supervision;with rolling walker PT Goal: Ambulate - Progress: Progressing toward goal  Visit Information  Last PT Received On: 02/06/13 Assistance Needed: +2    Subjective Data  Subjective: I'm feeling better and want to try again Patient Stated Goal: rehab and then home   Cognition  Cognition Overall Cognitive Status: Appears within functional limits for tasks assessed/performed Arousal/Alertness: Awake/alert Orientation Level: Appears intact for tasks assessed Behavior During Session: [facility] for tasks performed    Balance     End of Session PT - End of Session Equipment Utilized During Treatment: Gait belt;Left knee immobilizer Activity Tolerance: Patient limited by fatigue;Other (comment);Patient limited by pain Patient left: in bed;with call bell/phone within reach;with family/visitor present Nurse Communication: Mobility status;Other (comment)   GP     , 02/06/2013, 4:51 PM

## 2013-02-06 NOTE — Progress Notes (Signed)
Clinical Social Work Department CLINICAL SOCIAL WORK PLACEMENT NOTE 02/06/2013  Patient:  [name], [name]  Account Number:  [id number] Admit date:  02/05/2013  Clinical Social Worker:  Cori Razor, LCSW  Date/time:  02/06/2013 01:23 PM  Clinical Social Work is seeking post-discharge placement for this patient at the following level of care:   SKILLED NURSING   (*CSW will update this form in Epic as items are completed)   02/06/2013  Patient/family provided with [facility] Department of Clinical Social Work's list of facilities offering this level of care within the geographic area requested by the patient (or if unable, by the patient's family).  02/06/2013  Patient/family informed of their freedom to choose among providers that offer the needed level of care, that participate in Medicare, Medicaid or managed care program needed by the patient, have an available bed and are willing to accept the patient.  02/06/2013  Patient/family informed of [facility]' ownership interest in [facility], as well as of the fact that they are under no obligation to receive care at this facility.  PASARR submitted to EDS on 02/05/2013 PASARR number received from [employer] on 02/05/2013  FL2 transmitted to all facilities in geographic area requested by pt/family on  02/06/2013 FL2 transmitted to all facilities within larger geographic area on   Patient informed that his/her managed care company has contracts with or will negotiate with  certain facilities, including the following:     Patient/family informed of bed offers received:  02/06/2013 Patient chooses bed at  Physician recommends and patient chooses bed at    Patient to be transferred to  on   Patient to be transferred to facility by   The following physician request were entered in Epic:   Additional Comments:  Cori Razor LCSW [phone]

## 2013-02-06 NOTE — Evaluation (Signed)
Physical Therapy Evaluation Patient Details Name: [name] MRN: [id number] DOB: [birth year] Today's Date: 02/06/2013 Time: 1110-1140 PT Time Calculation (min): 30 min  PT Assessment / Plan / Recommendation Clinical Impression  Pt s/p R TKR presents with decreased R LE strength/ROM and post op pain limiting functional mobility    PT Assessment  Patient needs continued PT services    Follow Up Recommendations  SNF    Does the patient have the potential to tolerate intense rehabilitation      Barriers to Discharge Decreased caregiver support      Equipment Recommendations  None recommended by PT    Recommendations for Other Services OT consult   Frequency 7X/week    Precautions / Restrictions Precautions Precautions: Knee;Fall Restrictions Weight Bearing Restrictions: No Other Position/Activity Restrictions: wbat   Pertinent Vitals/Pain 5/10; premed, cold packs provided      Mobility  Bed Mobility Bed Mobility: Supine to Sit Supine to Sit: 4: Min assist Details for Bed Mobility Assistance: cues for sequence and use of L LE to self assist Transfers Transfers: Sit to Stand;Stand to Sit Sit to Stand: 1: +2 Total assist Sit to Stand: Patient Percentage: 60% Stand to Sit: 1: +2 Total assist Stand to Sit: Patient Percentage: 60% Details for Transfer Assistance: cues for use of UEs and for LE management Ambulation/Gait Ambulation/Gait Assistance: 1: +2 Total assist Ambulation/Gait: Patient Percentage: 60% Ambulation Distance (Feet): 8 Feet Assistive device: Rolling walker Ambulation/Gait Assistance Details: cues for posture, sequence, position from RW  Gait Pattern: Step-to pattern    Exercises Total Joint Exercises Ankle Circles/Pumps: AROM;10 reps;Supine;Both Quad Sets: AROM;10 reps;Both;Supine Heel Slides: AAROM;10 reps;Supine;Right Straight Leg Raises: AAROM;10 reps;Supine;Right   PT Diagnosis: Difficulty walking  PT Problem List: Decreased  strength;Decreased range of motion;Decreased activity tolerance;Decreased mobility;Decreased knowledge of use of DME;Pain PT Treatment Interventions: Gait training;DME instruction;Stair training;Functional mobility training;Therapeutic activities;Therapeutic exercise;Patient/family education   PT Goals Acute Rehab PT Goals PT Goal Formulation: With patient Time For Goal Achievement: 02/09/13 Potential to Achieve Goals: Good Pt will go Supine/Side to Sit: with supervision PT Goal: Supine/Side to Sit - Progress: Goal set today Pt will go Sit to Supine/Side: with supervision PT Goal: Sit to Supine/Side - Progress: Goal set today Pt will go Sit to Stand: with supervision PT Goal: Sit to Stand - Progress: Goal set today Pt will go Stand to Sit: with supervision PT Goal: Stand to Sit - Progress: Goal set today Pt will Ambulate: 51 - 150 feet;with supervision;with rolling walker PT Goal: Ambulate - Progress: Goal set today  Visit Information  Last PT Received On: 02/06/13 Assistance Needed: +2    Subjective Data  Subjective: I'm just apprehensive Patient Stated Goal: rehab and then home   Prior Functioning  Home Living Lives With: Daughter (dtr is ltd by hand injury) Available Help at Discharge: Family Prior Function Level of Independence: Independent;Independent with assistive device(s) Able to Take Stairs?: Yes Vocation: Retired Musician: No difficulties    Copywriter, advertising Overall Cognitive Status: Appears within functional limits for tasks assessed/performed Arousal/Alertness: Awake/alert Orientation Level: Appears intact for tasks assessed Behavior During Session: [facility] for tasks performed    Extremity/Trunk Assessment Right Upper Extremity Assessment RUE ROM/Strength/Tone: [facility] for tasks assessed Left Upper Extremity Assessment LUE ROM/Strength/Tone: WFL for tasks assessed Right Lower Extremity Assessment RLE ROM/Strength/Tone: Deficits RLE  ROM/Strength/Tone Deficits: 3/5 quads - AAROM -10 - 75 Left Lower Extremity Assessment LLE ROM/Strength/Tone: WFL for tasks assessed   Balance    End of Session  PT - End of Session Equipment Utilized During Treatment: Gait belt;Left knee immobilizer Activity Tolerance: Patient limited by fatigue;Patient limited by pain Patient left: in chair;with call bell/phone within reach;with family/visitor present Nurse Communication: Mobility status  GP     , 02/06/2013, 1:05 PM

## 2013-02-07 ENCOUNTER — Inpatient Hospital Stay (HOSPITAL_COMMUNITY): Payer: Medicare Other

## 2013-02-07 DIAGNOSIS — R5383 Other fatigue: Secondary | ICD-10-CM | POA: Diagnosis not present

## 2013-02-07 DIAGNOSIS — R05 Cough: Secondary | ICD-10-CM | POA: Diagnosis not present

## 2013-02-07 LAB — PROTIME-INR: Prothrombin Time: 15.1 seconds (ref 11.6–15.2)

## 2013-02-07 LAB — CBC
HCT: 25.7 % — ABNORMAL LOW (ref 36.0–46.0)
MCH: 32.5 pg (ref 26.0–34.0)
MCHC: 35 g/dL (ref 30.0–36.0)
MCV: 92.8 fL (ref 78.0–100.0)
Platelets: 186 10*3/uL (ref 150–400)
RDW: 12.8 % (ref 11.5–15.5)
WBC: 12.4 10*3/uL — ABNORMAL HIGH (ref 4.0–10.5)

## 2013-02-07 LAB — BASIC METABOLIC PANEL
BUN: 12 mg/dL (ref 6–23)
Calcium: 8.5 mg/dL (ref 8.4–10.5)
Chloride: 94 mEq/L — ABNORMAL LOW (ref 96–112)
Creatinine, Ser: 0.65 mg/dL (ref 0.50–1.10)
GFR calc Af Amer: 89 mL/min — ABNORMAL LOW (ref >90)

## 2013-02-07 MED ORDER — LEVOFLOXACIN 750 MG PO TABS
750.0000 mg | ORAL_TABLET | Freq: Every day | ORAL | Status: DC
  Administered 2013-02-07: 750 mg via ORAL
  Filled 2013-02-07: qty 1

## 2013-02-07 MED ORDER — LEVOFLOXACIN 750 MG PO TABS
750.0000 mg | ORAL_TABLET | Freq: Every day | ORAL | Status: DC
  Administered 2013-02-08: 750 mg via ORAL
  Filled 2013-02-07: qty 1

## 2013-02-07 MED ORDER — ALBUTEROL SULFATE (5 MG/ML) 0.5% IN NEBU
2.5000 mg | INHALATION_SOLUTION | Freq: Four times a day (QID) | RESPIRATORY_TRACT | Status: DC | PRN
  Administered 2013-02-07: 2.5 mg via RESPIRATORY_TRACT
  Filled 2013-02-07: qty 0.5

## 2013-02-07 MED ORDER — WARFARIN SODIUM 5 MG PO TABS
5.0000 mg | ORAL_TABLET | Freq: Once | ORAL | Status: AC
  Administered 2013-02-07: 5 mg via ORAL
  Filled 2013-02-07: qty 1

## 2013-02-07 MED FILL — Clindamycin Phosphate in D5W IV Soln 900 MG/50ML: INTRAVENOUS | Qty: 50 | Status: AC

## 2013-02-07 NOTE — Progress Notes (Signed)
CSW assisting with d/c planning. Pt has a ST SNF placement at [facility] when stable for d/c. CSW will continue to follow to assist with d/c planning to SNF.  Cori Razor LCSW [phone]

## 2013-02-07 NOTE — Anesthesia Postprocedure Evaluation (Signed)
  Anesthesia Post-op Note  Patient: [name]  Procedure(s) Performed: Procedure(s) (LRB): TOTAL KNEE ARTHROPLASTY (Right)  Patient Location: PACU  Anesthesia Type: Spinal  Level of Consciousness: awake and alert   Airway and Oxygen Therapy: Patient Spontanous Breathing  Post-op Pain: mild  Post-op Assessment: Post-op Vital signs reviewed, Patient's Cardiovascular Status Stable, Respiratory Function Stable, Patent Airway and No signs of Nausea or vomiting  Last Vitals:  Filed Vitals:   02/07/13 0500  BP: 112/61  Pulse: 63  Temp: 36.8 C  Resp: 16    Post-op Vital Signs: stable   Complications: No apparent anesthesia complications

## 2013-02-07 NOTE — Progress Notes (Signed)
Inpatient Diabetes Program Recommendations  AACE/ADA: New Consensus Statement on Inpatient Glycemic Control (2013)  Target Ranges:  Prepandial:   less than 140 mg/dL      Peak postprandial:   less than 180 mg/dL (1-2 hours)      Critically ill patients:  140 - 180 mg/dL   Reason for Assessment:  Elevated Lab Glucose X 2 days  Results for Adriana, Chambers (MRN [id number]) as of 02/07/2013 10:23  Ref. Range 02/06/2013 04:20 02/07/2013 04:39  Glucose Latest Range: 70-99 mg/dL 086 (H) 578 (H)    Inpatient Diabetes Program Recommendations Correction (SSI): Please consider addition of Novolog sensitive tidwc  Note: Will continue to follow.  Thank you. Ailene Ards, RD, LDN, CDE Inpatient Diabetes Coordinator [phone]

## 2013-02-07 NOTE — Clinical Documentation Improvement (Signed)
Anemia Blood Loss Clarification  THIS DOCUMENT IS NOT A PERMANENT PART OF THE MEDICAL RECORD  RESPOND TO THE THIS QUERY, FOLLOW THE INSTRUCTIONS BELOW:  1. If needed, update documentation for the patient's encounter via the notes activity.  2. Access this query again and click edit on the In [employer].  3. After updating, or not, click F2 to complete all highlighted (required) fields concerning your review. Select "additional documentation in the medical record" OR "no additional documentation provided".  4. Click Sign note button.  5. The deficiency will fall out of your In Basket *Please let us know if you are not able to complete this workflow by phone or e-mail (listed below).        02/07/13  Dear Gerrit Halls, PA Marton Redwood  In an effort to better capture your patient's severity of illness, reflect appropriate length of stay and utilization of resources, a review of the patient medical record has revealed the following indicators.    Based on your clinical judgment, please clarify and document in a progress note and/or discharge summary the clinical condition associated with the following supporting information:  In responding to this query please exercise your independent judgment.  The fact that a query is asked, does not imply that any particular answer is desired or expected.  Post Op H/H=9.0/25.7  Clarification Needed    Please clarify if underlying diagnosis responsible for the abnormal H/H and document in pn or d/c summary     Possible Clinical Conditions?   " Expected Acute Blood Loss Anemia  " Acute Blood Loss Anemia  " Acute on chronic blood loss anemia    " Other Condition________________  " Cannot Clinically Determine  Risk Factors: (recent surgery, pre op anemia, EBL in OR)  Supporting Information:  Signs and Symptoms  End stage arthritis, right knee, R TKA.   Diagnostics: EBL < 200 ml Component     Latest Ref Rng 02/06/2013  02/07/2013           Hemoglobin     12.0 - 15.0 g/dL 9.8 (L) 9.0 (L)  HCT     36.0 - 46.0 % 28.2 (L) 25.7 (L)   Treatments: Monitoring  ferrous sulfate tablet 325 mg   Reviewed:  no additional documentation provided ljh  Thank You,  Enis Slipper  RN, BSN, MSN/Inf, CCDS Clinical Documentation Specialist [facility] Dept Pager: [phone] / E-mail: [email or web address]  Health Information Management [place]

## 2013-02-07 NOTE — Progress Notes (Signed)
ANTICOAGULATION CONSULT NOTE - Follow Up Consult  Pharmacy Consult for Warfarin Indication: VTE prophylaxis, hx of DVT and Afib  Allergies  Allergen Reactions  . Contrast Media (Iodinated Diagnostic Agents) Hives  . Penicillins Rash    "haven't had it in years"    Patient Measurements: Height: 5\' 5"  (165.1 cm) Weight: 162 lb (73.483 kg) IBW/kg (Calculated) : 57  Vital Signs: Temp: 98.3 F (36.8 C) (02/26 0500) Temp src: Oral (02/26 0500) BP: 112/61 mmHg (02/26 0500) Pulse Rate: 63 (02/26 0500)  Labs:  Recent Labs  02/05/13 1200 02/06/13 0420 02/07/13 0439  HGB  --  9.8* 9.0*  HCT  --  28.2* 25.7*  PLT  --  177 186  LABPROT 13.4 14.0 15.1  INR 1.03 1.09 1.21  CREATININE  --  0.53 0.65    Estimated Creatinine Clearance: 47.9 ml/min (by C-G formula based on Cr of 0.65).   Medications:  Scheduled:  . cholecalciferol  1,000 Units Oral Daily  . [COMPLETED] dexamethasone  10 mg Intravenous Once  . diltiazem  240 mg Oral q morning - 10a  . docusate sodium  100 mg Oral BID  . enoxaparin (LOVENOX) injection  30 mg Subcutaneous Q12H  . ferrous sulfate  325 mg Oral TID PC  . furosemide  40 mg Oral Daily  . HYDROcodone-acetaminophen  1-2 tablet Oral Q4H  . metoprolol succinate  50 mg Oral QHS  . pantoprazole  40 mg Oral Daily  . senna  1 tablet Oral BID  . spironolactone  25 mg Oral Daily  . [COMPLETED] warfarin  7.5 mg Oral ONCE-1800  . Warfarin - Pharmacist Dosing Inpatient   Does not apply q1800   Infusions:  . sodium chloride 0.9 % 1,000 mL with potassium chloride 10 mEq infusion 20 mL/hr at 02/06/13 1610    Assessment: 89 yof on chronic warfarin PTA for hx of Afib and DVT.  She is now s/p right TKA 2/24.  Pharmacy was asked to resume warfarin dosing for VTE prophylaxis.  PTA dose was 5mg  daily, last dose taken on 01/31/13  Lovenox 30mg  SQ BID ordered per MD for warfarin bridge; d/c when INR >/= 1.8  INR (1.21) remains subtherapeutic, but increasing with  warfarin restart.  CBC:  Hgb decreased to 9.0 and Plt remain wnl.  No bleeding reported/documented.  Goal of Therapy:  INR 2-3 Monitor platelets by anticoagulation protocol: Yes   Plan:   Warfarin 5mg  PO at 1800 x1  Daily INR, CBC  Lynann Beaver PharmD, BCPS Pager [phone] 02/07/2013 8:19 AM

## 2013-02-07 NOTE — Progress Notes (Signed)
Patient ID: [name], female   DOB: Mar 22, 1921, 79 y.o.   MRN: [id number] Subjective: 2 Days Post-Op Procedure(s) (LRB): TOTAL KNEE ARTHROPLASTY (Right)    Patient reports pain as mild.  Tolerating her post-operative course fine, no events  Objective:   VITALS:   Filed Vitals:   02/07/13 0500  BP: 112/61  Pulse: 63  Temp: 98.3 F (36.8 C)  Resp: 16    Neurovascular intact Incision: dressing C/D/I ACE wrap off today  LABS  Recent Labs  02/06/13 0420 02/07/13 0439  HGB 9.8* 9.0*  HCT 28.2* 25.7*  WBC 8.5 12.4*  PLT 177 186     Recent Labs  02/06/13 0420 02/07/13 0439  NA 125* 128*  K 3.8 4.4  BUN 8 12  CREATININE 0.53 0.65  GLUCOSE 229* 194*     Recent Labs  02/06/13 0420 02/07/13 0439  INR 1.09 1.21     Assessment/Plan: 2 Days Post-Op Procedure(s) (LRB): TOTAL KNEE ARTHROPLASTY (Right)   Up with therapy Discharge to SNF tomorrow, family decided to go to [facility]

## 2013-02-07 NOTE — Progress Notes (Signed)
Physical Therapy Treatment Patient Details Name: [name] MRN: [id number] DOB: Apr 30, 1921 Today's Date: 02/07/2013 Time: 1012-1042 PT Time Calculation (min): 30 min  PT Assessment / Plan / Recommendation Comments on Treatment Session       Follow Up Recommendations  SNF     Does the patient have the potential to tolerate intense rehabilitation     Barriers to Discharge        Equipment Recommendations  None recommended by PT    Recommendations for Other Services OT consult  Frequency 7X/week   Plan Discharge plan remains appropriate    Precautions / Restrictions Precautions Precautions: Knee;Fall Restrictions Weight Bearing Restrictions: No Other Position/Activity Restrictions: wbat   Pertinent Vitals/Pain 6/10; premed, cold packs provided    Mobility  Transfers Transfers: Sit to Stand;Stand to Sit Sit to Stand: 4: Min assist Stand to Sit: 4: Min assist Details for Transfer Assistance: cues for use of UEs and for LE management Ambulation/Gait Ambulation/Gait Assistance: 3: Mod assist;4: Min assist Ambulation Distance (Feet): 59 Feet Assistive device: Rolling walker Ambulation/Gait Assistance Details: cues for posture, position from RW, sequence and increased R LE WB Gait Pattern: Step-to pattern    Exercises Total Joint Exercises Ankle Circles/Pumps: AROM;Supine;Both;20 reps Quad Sets: AROM;Both;Supine;20 reps Heel Slides: AAROM;Supine;Right;20 reps Straight Leg Raises: AAROM;Supine;Right;AROM;20 reps   PT Diagnosis:    PT Problem List:   PT Treatment Interventions:     PT Goals Acute Rehab PT Goals PT Goal Formulation: With patient Time For Goal Achievement: 02/09/13 Potential to Achieve Goals: Good Pt will go Supine/Side to Sit: with supervision PT Goal: Supine/Side to Sit - Progress: Progressing toward goal Pt will go Sit to Supine/Side: with supervision PT Goal: Sit to Supine/Side - Progress: Progressing toward goal Pt will go Sit to  Stand: with supervision PT Goal: Sit to Stand - Progress: Progressing toward goal Pt will go Stand to Sit: with supervision PT Goal: Stand to Sit - Progress: Progressing toward goal Pt will Ambulate: 51 - 150 feet;with supervision;with rolling walker PT Goal: Ambulate - Progress: Progressing toward goal  Visit Information  Last PT Received On: 02/07/13 Assistance Needed: +1    Subjective Data  Subjective: I'm ready Patient Stated Goal: rehab and then home   Cognition  Cognition Overall Cognitive Status: Appears within functional limits for tasks assessed/performed Arousal/Alertness: Awake/alert Orientation Level: Appears intact for tasks assessed Behavior During Session: [facility] for tasks performed    Balance     End of Session PT - End of Session Equipment Utilized During Treatment: Gait belt Activity Tolerance: Patient tolerated treatment well Patient left: in chair;with call bell/phone within reach Nurse Communication: Mobility status;Other (comment)   GP     , 02/07/2013, 10:46 AM

## 2013-02-07 NOTE — Progress Notes (Signed)
Physical Therapy Treatment Patient Details Name: [name] MRN: [id number] DOB: August 04, 1921 Today's Date: 02/07/2013 Time: 1553-1610 PT Time Calculation (min): 17 min  PT Assessment / Plan / Recommendation Comments on Treatment Session       Follow Up Recommendations  SNF     Does the patient have the potential to tolerate intense rehabilitation     Barriers to Discharge        Equipment Recommendations  None recommended by PT    Recommendations for Other Services OT consult  Frequency 7X/week   Plan Discharge plan remains appropriate    Precautions / Restrictions Precautions Precautions: Knee;Fall Restrictions Weight Bearing Restrictions: No Other Position/Activity Restrictions: wbat   Pertinent Vitals/Pain 5/10; premed, ice packs provided    Mobility  Bed Mobility Bed Mobility: Sit to Supine Sit to Supine: 4: Min assist;3: Mod assist Details for Bed Mobility Assistance: cues for sequence and use of L LE to self assist Transfers Transfers: Sit to Stand;Stand to Sit Sit to Stand: 4: Min assist Stand to Sit: 4: Min assist Details for Transfer Assistance: cues for use of UEs and for LE management Ambulation/Gait Ambulation/Gait Assistance: 4: Min assist;3: Mod assist Ambulation Distance (Feet): 98 Feet Assistive device: Rolling walker Ambulation/Gait Assistance Details: cues for posture, position from RW, sequence, stride length, pace and increased R Heel contact Gait Pattern: Step-to pattern    Exercises     PT Diagnosis:    PT Problem List:   PT Treatment Interventions:     PT Goals Acute Rehab PT Goals PT Goal Formulation: With patient Time For Goal Achievement: 02/09/13 Potential to Achieve Goals: Good Pt will go Supine/Side to Sit: with supervision PT Goal: Supine/Side to Sit - Progress: Progressing toward goal Pt will go Sit to Supine/Side: with supervision PT Goal: Sit to Supine/Side - Progress: Progressing toward goal Pt will go Sit to  Stand: with supervision PT Goal: Sit to Stand - Progress: Progressing toward goal Pt will go Stand to Sit: with supervision PT Goal: Stand to Sit - Progress: Progressing toward goal Pt will Ambulate: 51 - 150 feet;with supervision;with rolling walker PT Goal: Ambulate - Progress: Progressing toward goal  Visit Information  Last PT Received On: 02/07/13 Assistance Needed: +1    Subjective Data  Subjective: I'm tired but I'll walk a little Patient Stated Goal: rehab and then home   Cognition  Cognition Overall Cognitive Status: Appears within functional limits for tasks assessed/performed Arousal/Alertness: Awake/alert Orientation Level: Appears intact for tasks assessed Behavior During Session: [facility] for tasks performed    Balance     End of Session PT - End of Session Equipment Utilized During Treatment: Gait belt Activity Tolerance: Patient tolerated treatment well Patient left: in bed;with call bell/phone within reach;with family/visitor present Nurse Communication: Mobility status;Other (comment)   GP     , 02/07/2013, 4:32 PM

## 2013-02-08 DIAGNOSIS — I4891 Unspecified atrial fibrillation: Secondary | ICD-10-CM | POA: Diagnosis not present

## 2013-02-08 DIAGNOSIS — R262 Difficulty in walking, not elsewhere classified: Secondary | ICD-10-CM | POA: Diagnosis not present

## 2013-02-08 DIAGNOSIS — Z79899 Other long term (current) drug therapy: Secondary | ICD-10-CM | POA: Diagnosis not present

## 2013-02-08 DIAGNOSIS — S8990XA Unspecified injury of unspecified lower leg, initial encounter: Secondary | ICD-10-CM | POA: Diagnosis not present

## 2013-02-08 DIAGNOSIS — R1904 Left lower quadrant abdominal swelling, mass and lump: Secondary | ICD-10-CM | POA: Diagnosis not present

## 2013-02-08 DIAGNOSIS — R29818 Other symptoms and signs involving the nervous system: Secondary | ICD-10-CM | POA: Diagnosis not present

## 2013-02-08 DIAGNOSIS — J42 Unspecified chronic bronchitis: Secondary | ICD-10-CM | POA: Diagnosis not present

## 2013-02-08 DIAGNOSIS — M1990 Unspecified osteoarthritis, unspecified site: Secondary | ICD-10-CM | POA: Diagnosis not present

## 2013-02-08 DIAGNOSIS — Z5189 Encounter for other specified aftercare: Secondary | ICD-10-CM | POA: Diagnosis not present

## 2013-02-08 DIAGNOSIS — Z96659 Presence of unspecified artificial knee joint: Secondary | ICD-10-CM | POA: Diagnosis not present

## 2013-02-08 DIAGNOSIS — K279 Peptic ulcer, site unspecified, unspecified as acute or chronic, without hemorrhage or perforation: Secondary | ICD-10-CM | POA: Diagnosis not present

## 2013-02-08 DIAGNOSIS — I82409 Acute embolism and thrombosis of unspecified deep veins of unspecified lower extremity: Secondary | ICD-10-CM | POA: Diagnosis not present

## 2013-02-08 DIAGNOSIS — K5900 Constipation, unspecified: Secondary | ICD-10-CM | POA: Diagnosis not present

## 2013-02-08 DIAGNOSIS — R059 Cough, unspecified: Secondary | ICD-10-CM | POA: Diagnosis not present

## 2013-02-08 DIAGNOSIS — Z471 Aftercare following joint replacement surgery: Secondary | ICD-10-CM | POA: Diagnosis not present

## 2013-02-08 DIAGNOSIS — M25569 Pain in unspecified knee: Secondary | ICD-10-CM | POA: Diagnosis not present

## 2013-02-08 DIAGNOSIS — R05 Cough: Secondary | ICD-10-CM | POA: Diagnosis not present

## 2013-02-08 DIAGNOSIS — Z7901 Long term (current) use of anticoagulants: Secondary | ICD-10-CM | POA: Diagnosis not present

## 2013-02-08 DIAGNOSIS — R279 Unspecified lack of coordination: Secondary | ICD-10-CM | POA: Diagnosis not present

## 2013-02-08 DIAGNOSIS — I259 Chronic ischemic heart disease, unspecified: Secondary | ICD-10-CM | POA: Diagnosis not present

## 2013-02-08 LAB — PROTIME-INR: INR: 1.72 — ABNORMAL HIGH (ref 0.00–1.49)

## 2013-02-08 MED ORDER — ENOXAPARIN SODIUM 40 MG/0.4ML ~~LOC~~ SOLN: 40.0000 mg | SUBCUTANEOUS | Status: DC

## 2013-02-08 MED ORDER — HYDROCODONE-ACETAMINOPHEN 7.5-325 MG PO TABS: 1.0000 | ORAL_TABLET | ORAL | Status: DC

## 2013-02-08 MED ORDER — METHOCARBAMOL 500 MG PO TABS: 500.0000 mg | ORAL_TABLET | Freq: Four times a day (QID) | ORAL | Status: DC | PRN

## 2013-02-08 MED ORDER — FERROUS SULFATE 325 (65 FE) MG PO TABS: 325.0000 mg | ORAL_TABLET | Freq: Three times a day (TID) | ORAL | Status: DC

## 2013-02-08 MED ORDER — POLYETHYLENE GLYCOL 3350 17 G PO PACK: 17.0000 g | PACK | Freq: Every day | ORAL | Status: DC | PRN

## 2013-02-08 MED ORDER — LEVOFLOXACIN 750 MG PO TABS: 750.0000 mg | ORAL_TABLET | Freq: Every day | ORAL | Status: DC

## 2013-02-08 NOTE — Progress Notes (Signed)
Physical Therapy Treatment Patient Details Name: [name] MRN: [id number] DOB: 07-04-1921 Today's Date: 02/08/2013 Time: 7829-5621 PT Time Calculation (min): 28 min  PT Assessment / Plan / Recommendation Comments on Treatment Session       Follow Up Recommendations  SNF     Does the patient have the potential to tolerate intense rehabilitation     Barriers to Discharge        Equipment Recommendations  None recommended by PT    Recommendations for Other Services OT consult  Frequency 7X/week   Plan Discharge plan remains appropriate    Precautions / Restrictions Precautions Precautions: Knee;Fall Restrictions Weight Bearing Restrictions: No Other Position/Activity Restrictions: wbat   Pertinent Vitals/Pain 8/10; Meds requested, cold packs provided    Mobility  Bed Mobility Bed Mobility: Supine to Sit Supine to Sit: 4: Min assist Details for Bed Mobility Assistance: cues for sequence and use of L LE to self assist Transfers Transfers: Sit to Stand;Stand to Sit Sit to Stand: 4: Min guard Stand to Sit: 4: Min guard Details for Transfer Assistance: cues for use of UEs and for LE management Ambulation/Gait Ambulation/Gait Assistance: 4: Min assist Ambulation Distance (Feet): 82 Feet Assistive device: Rolling walker Ambulation/Gait Assistance Details: min cues for posture, sequence, postion from RW and increased R heel contact Gait Pattern: Step-to pattern    Exercises Total Joint Exercises Ankle Circles/Pumps: AROM;Supine;Both;20 reps Quad Sets: AROM;Both;Supine;20 reps Heel Slides: AAROM;Supine;Right;20 reps Straight Leg Raises: AAROM;Supine;Right;AROM;20 reps   PT Diagnosis:    PT Problem List:   PT Treatment Interventions:     PT Goals Acute Rehab PT Goals PT Goal Formulation: With patient Time For Goal Achievement: 02/09/13 Potential to Achieve Goals: Good Pt will go Supine/Side to Sit: with supervision PT Goal: Supine/Side to Sit -  Progress: Progressing toward goal Pt will go Sit to Supine/Side: with supervision PT Goal: Sit to Supine/Side - Progress: Progressing toward goal Pt will go Sit to Stand: with supervision PT Goal: Sit to Stand - Progress: Progressing toward goal Pt will go Stand to Sit: with supervision PT Goal: Stand to Sit - Progress: Progressing toward goal Pt will Ambulate: 51 - 150 feet;with supervision;with rolling walker PT Goal: Ambulate - Progress: Progressing toward goal  Visit Information  Last PT Received On: 02/08/13 Assistance Needed: +1    Subjective Data  Subjective: I slept well but not sure if I am leaving today Patient Stated Goal: rehab and then home   Cognition  Cognition Overall Cognitive Status: Appears within functional limits for tasks assessed/performed Arousal/Alertness: Awake/alert Orientation Level: Appears intact for tasks assessed Behavior During Session: [facility] for tasks performed    Balance     End of Session PT - End of Session Activity Tolerance: Patient tolerated treatment well Patient left: in chair;with call bell/phone within reach Nurse Communication: Mobility status   GP     , 02/08/2013, 9:05 AM

## 2013-02-08 NOTE — Discharge Summary (Signed)
Physician Discharge Summary   Patient ID: [name] MRN: [id number] DOB/AGE: [birth year] 79 y.o.  Admit date: 02/05/2013 Discharge date: 02/08/2013  Primary Diagnosis:  Right knee osteoarthritis.   Admission Diagnoses:  Past Medical History  Diagnosis Date  . Coronary artery disease   . GERD (gastroesophageal reflux disease)   . Headache   . High cholesterol   . DVT (deep venous thrombosis), right 2005    "dry blood; after 8 foot fall"  . Chronic bronchitis     "get it basically 3 times/yr"  . Blood transfusion 1960  . H/O hiatal hernia   . Rheumatoid arthritis   . Concussion w/o coma 03/31/2004    "even now has times when she's not able to comprehend" (05/08/12)  . Atrial fibrillation   . Pneumonia ~ 2010 AND 2013  . Bladder cancer dx'd 2011  . Diverticulitis    Discharge Diagnoses:   Active Problems:   * No active hospital problems. *  Estimated body mass index is 26.96 kg/(m^2) as calculated from the following:   Height as of this encounter: 5\' 5"  (1.651 m).   Weight as of this encounter: 73.483 kg (162 lb).  Classification of overweight in adults according to BMI (WHO, 1998)   Procedure:  Procedure(s) (LRB): TOTAL KNEE ARTHROPLASTY (Right)   Consults: None  HPI: The patient was noted to have complete loss of cartilage and  bone-on-bone arthritis with associated osteophytes in all three compartments of  the knee worse laterally with significant correctable valgus deformitywith a significant synovitis and associated effusion.   Laboratory Data: Admission on 02/05/2013  Component Date Value Range Status  . ABO/RH(D) 02/05/2013 A POS   Final  . Antibody Screen 02/05/2013 NEG   Final  . Sample Expiration 02/05/2013 02/08/2013   Final  . Prothrombin Time 02/05/2013 13.4  11.6 - 15.2 seconds Final  . INR 02/05/2013 1.03  0.00 - 1.49 Final  . ABO/RH(D) 02/05/2013 A POS   Final  . WBC 02/06/2013 8.5  4.0 - 10.5 K/uL Final  . RBC 02/06/2013 3.03* 3.87 -  5.11 MIL/uL Final  . Hemoglobin 02/06/2013 9.8* 12.0 - 15.0 g/dL Final  . HCT 40/98/1191 28.2* 36.0 - 46.0 % Final  . MCV 02/06/2013 93.1  78.0 - 100.0 fL Final  . MCH 02/06/2013 32.3  26.0 - 34.0 pg Final  . MCHC 02/06/2013 34.8  30.0 - 36.0 g/dL Final  . RDW 47/82/9562 12.9  11.5 - 15.5 % Final  . Platelets 02/06/2013 177  150 - 400 K/uL Final  . Sodium 02/06/2013 125* 135 - 145 mEq/L Final  . Potassium 02/06/2013 3.8  3.5 - 5.1 mEq/L Final  . Chloride 02/06/2013 92* 96 - 112 mEq/L Final  . CO2 02/06/2013 24  19 - 32 mEq/L Final  . Glucose, Bld 02/06/2013 229* 70 - 99 mg/dL Final  . BUN 13/07/6577 8  6 - 23 mg/dL Final  . Creatinine, Ser 02/06/2013 0.53  0.50 - 1.10 mg/dL Final  . Calcium 46/96/2952 8.2* 8.4 - 10.5 mg/dL Final  . GFR calc non Af Amer 02/06/2013 82* >90 mL/min Final  . GFR calc Af Amer 02/06/2013 >90  >90 mL/min Final   Comment:                                 The eGFR has been calculated  using the CKD EPI equation.                          This calculation has not been                          validated in all clinical                          situations.                          eGFR's persistently                          <90 mL/min signify                          possible Chronic Kidney Disease.  Marland Kitchen Prothrombin Time 02/06/2013 14.0  11.6 - 15.2 seconds Final  . INR 02/06/2013 1.09  0.00 - 1.49 Final  . WBC 02/07/2013 12.4* 4.0 - 10.5 K/uL Final  . RBC 02/07/2013 2.77* 3.87 - 5.11 MIL/uL Final  . Hemoglobin 02/07/2013 9.0* 12.0 - 15.0 g/dL Final  . HCT 16/09/9603 25.7* 36.0 - 46.0 % Final  . MCV 02/07/2013 92.8  78.0 - 100.0 fL Final  . MCH 02/07/2013 32.5  26.0 - 34.0 pg Final  . MCHC 02/07/2013 35.0  30.0 - 36.0 g/dL Final  . RDW 54/08/8118 12.8  11.5 - 15.5 % Final  . Platelets 02/07/2013 186  150 - 400 K/uL Final  . Sodium 02/07/2013 128* 135 - 145 mEq/L Final  . Potassium 02/07/2013 4.4  3.5 - 5.1 mEq/L Final  . Chloride  02/07/2013 94* 96 - 112 mEq/L Final  . CO2 02/07/2013 27  19 - 32 mEq/L Final  . Glucose, Bld 02/07/2013 194* 70 - 99 mg/dL Final  . BUN 14/78/2956 12  6 - 23 mg/dL Final  . Creatinine, Ser 02/07/2013 0.65  0.50 - 1.10 mg/dL Final  . Calcium 21/30/8657 8.5  8.4 - 10.5 mg/dL Final  . GFR calc non Af Amer 02/07/2013 76* >90 mL/min Final  . GFR calc Af Amer 02/07/2013 89* >90 mL/min Final   Comment:                                 The eGFR has been calculated                          using the CKD EPI equation.                          This calculation has not been                          validated in all clinical                          situations.                          eGFR's persistently                          <  90 mL/min signify                          possible Chronic Kidney Disease.  Marland Kitchen Prothrombin Time 02/07/2013 15.1  11.6 - 15.2 seconds Final  . INR 02/07/2013 1.21  0.00 - 1.49 Final  . Prothrombin Time 02/08/2013 19.6* 11.6 - 15.2 seconds Final  . INR 02/08/2013 1.72* 0.00 - 1.49 Final  Admission on 01/30/2013, Discharged on 01/30/2013  Component Date Value Range Status  . Prothrombin Time 01/30/2013 26.9* 11.6 - 15.2 seconds Final  . INR 01/30/2013 2.64* 0.00 - 1.49 Final  . Squamous Epithelial / LPF 01/30/2013 RARE  RARE Final  . WBC, UA 01/30/2013 0-2  <3 WBC/hpf Final  . RBC / HPF 01/30/2013 11-20  <3 RBC/hpf Final  . Bacteria, UA 01/30/2013 RARE  RARE Final  Hospital Outpatient Visit on 01/30/2013  Component Date Value Range Status  . MRSA, PCR 01/30/2013 NEGATIVE  NEGATIVE Final  . Staphylococcus aureus 01/30/2013 NEGATIVE  NEGATIVE Final   Comment:                                 The Xpert SA Assay (FDA                          approved for NASAL specimens                          in patients over 25 years of age),                          is one component of                          a comprehensive surveillance                          program.  Test  performance has                          been validated by [employer] for patients greater                          than or equal to 12 year old.                          It is not intended                          to diagnose infection nor to                          guide or monitor treatment.  Marland Kitchen aPTT 01/30/2013 42* 24 - 37 seconds Final   Comment:                                 IF BASELINE aPTT IS ELEVATED,  SUGGEST PATIENT RISK ASSESSMENT                          BE USED TO DETERMINE APPROPRIATE                          ANTICOAGULANT THERAPY.  . Sodium 01/30/2013 131* 135 - 145 mEq/L Final  . Potassium 01/30/2013 3.7  3.5 - 5.1 mEq/L Final  . Chloride 01/30/2013 93* 96 - 112 mEq/L Final  . CO2 01/30/2013 28  19 - 32 mEq/L Final  . Glucose, Bld 01/30/2013 109* 70 - 99 mg/dL Final  . BUN 40/98/1191 13  6 - 23 mg/dL Final  . Creatinine, Ser 01/30/2013 0.76  0.50 - 1.10 mg/dL Final  . Calcium 47/82/9562 8.8  8.4 - 10.5 mg/dL Final  . GFR calc non Af Amer 01/30/2013 73* >90 mL/min Final  . GFR calc Af Amer 01/30/2013 84* >90 mL/min Final   Comment:                                 The eGFR has been calculated                          using the CKD EPI equation.                          This calculation has not been                          validated in all clinical                          situations.                          eGFR's persistently                          <90 mL/min signify                          possible Chronic Kidney Disease.  . WBC 01/30/2013 12.8* 4.0 - 10.5 K/uL Final  . RBC 01/30/2013 4.29  3.87 - 5.11 MIL/uL Final  . Hemoglobin 01/30/2013 13.8  12.0 - 15.0 g/dL Final  . HCT 13/07/6577 40.5  36.0 - 46.0 % Final  . MCV 01/30/2013 94.4  78.0 - 100.0 fL Final  . MCH 01/30/2013 32.2  26.0 - 34.0 pg Final  . MCHC 01/30/2013 34.1  30.0 - 36.0 g/dL Final  . RDW 46/96/2952 12.6  11.5 - 15.5 % Final  . Platelets  01/30/2013 248  150 - 400 K/uL Final  . Prothrombin Time 01/30/2013 25.9* 11.6 - 15.2 seconds Final  . INR 01/30/2013 2.51* 0.00 - 1.49 Final  . Color, Urine 01/30/2013 YELLOW  YELLOW Final  . APPearance 01/30/2013 CLEAR  CLEAR Final  . Specific Gravity, Urine 01/30/2013 1.009  1.005 - 1.030 Final  . pH 01/30/2013 6.5  5.0 - 8.0 Final  . Glucose, UA 01/30/2013 NEGATIVE  NEGATIVE mg/dL Final  . Hgb urine dipstick 01/30/2013 LARGE* NEGATIVE Final  . Bilirubin Urine 01/30/2013 NEGATIVE  NEGATIVE Final  . Ketones, ur  01/30/2013 NEGATIVE  NEGATIVE mg/dL Final  . Protein, ur 08/65/7846 NEGATIVE  NEGATIVE mg/dL Final  . Urobilinogen, UA 01/30/2013 0.2  0.0 - 1.0 mg/dL Final  . Nitrite 96/29/5284 NEGATIVE  NEGATIVE Final  . Leukocytes, UA 01/30/2013 TRACE* NEGATIVE Final     X-Rays:Dg Sacrum/coccyx  01/30/2013  *RADIOLOGY REPORT*  Clinical Data: Larey Seat.  Sacral pain.  SACRUM AND COCCYX - 2+ VIEW  Comparison: None  Findings: The pubic symphysis and SI joints are intact.  Both hips are normally located.  Asymmetric right-sided hip joint degenerative changes.  No obvious acute fracture of the sacrum or coccyx.  If pain persists CT may be helpful for further evaluation.  IMPRESSION:  No obvious acute fracture.   Original Report Authenticated By: Rudie Meyer, M.D.    Ct Head Wo Contrast  01/30/2013  *RADIOLOGY REPORT*  Clinical Data:  Fall  CT HEAD WITHOUT CONTRAST CT CERVICAL SPINE WITHOUT CONTRAST  Technique:  Multidetector CT imaging of the head and cervical spine was performed following the standard protocol without intravenous contrast.  Multiplanar CT image reconstructions of the cervical spine were also generated.  Comparison:  06/06/2012  CT HEAD  Findings: Minimal chronic ischemic changes in the periventricular white matter. No mass effect, midline shift, or acute intracranial hemorrhage.  Mastoid air cells and visualized paranasal sinuses are clear.  Cranium is intact.  IMPRESSION: No acute  intracranial pathology.  CT CERVICAL SPINE  Findings: No acute fracture.  No dislocation.  Scattered degenerative changes are noted.  Prominent right-sided facet arthropathy at C4-5 with coexisting mild anterolisthesis of C4 upon C5.  Posterior disc osteophytes occur most prominently at C3-4. Minimal uncovertebral osteophytes.  There is some foraminal narrowing on the right at C4-5 secondary to facet arthropathy.  Atherosclerotic calcified plaque is noted.  1.9 cm right thyroid nodule.  IMPRESSION: No acute bony injury in the cervical spine.  Degenerative change.  1.9 cm right thyroid nodule.  Thyroid ultrasound is recommended.   Original Report Authenticated By: Jolaine Click, M.D.    Ct Cervical Spine Wo Contrast  01/30/2013  *RADIOLOGY REPORT*  Clinical Data:  Fall  CT HEAD WITHOUT CONTRAST CT CERVICAL SPINE WITHOUT CONTRAST  Technique:  Multidetector CT imaging of the head and cervical spine was performed following the standard protocol without intravenous contrast.  Multiplanar CT image reconstructions of the cervical spine were also generated.  Comparison:  06/06/2012  CT HEAD  Findings: Minimal chronic ischemic changes in the periventricular white matter. No mass effect, midline shift, or acute intracranial hemorrhage.  Mastoid air cells and visualized paranasal sinuses are clear.  Cranium is intact.  IMPRESSION: No acute intracranial pathology.  CT CERVICAL SPINE  Findings: No acute fracture.  No dislocation.  Scattered degenerative changes are noted.  Prominent right-sided facet arthropathy at C4-5 with coexisting mild anterolisthesis of C4 upon C5.  Posterior disc osteophytes occur most prominently at C3-4. Minimal uncovertebral osteophytes.  There is some foraminal narrowing on the right at C4-5 secondary to facet arthropathy.  Atherosclerotic calcified plaque is noted.  1.9 cm right thyroid nodule.  IMPRESSION: No acute bony injury in the cervical spine.  Degenerative change.  1.9 cm right thyroid  nodule.  Thyroid ultrasound is recommended.   Original Report Authenticated By: Jolaine Click, M.D.    [facility] 1 View  02/07/2013  *RADIOLOGY REPORT*  Clinical Data: Cough, weakness.  PORTABLE CHEST - 1 VIEW  Comparison: 08/21/2012  Findings: There is hyperinflation of the lungs compatible with COPD.  Heart is  borderline in size.  Increased markings in the lung bases, likely scarring, stable.  No acute opacities or effusions. No acute bony abnormality.  IMPRESSION: COPD/chronic changes.  No acute findings.   Original Report Authenticated By: Charlett Nose, M.D.     EKG: Orders placed during the hospital encounter of 05/08/12  . EKG 12-LEAD  . EKG 12-LEAD  . EKG     Hospital Course: MADILINE SAFFRAN is a 79 y.o. who was admitted to [facility]. They were brought to the operating room on 02/05/2013 and underwent Procedure(s): TOTAL KNEE ARTHROPLASTY.  Patient tolerated the procedure well and was later transferred to the recovery room and then to the orthopaedic floor for postoperative care.  They were given PO and IV analgesics for pain control following their surgery.  They were given 24 hours of postoperative antibiotics of  Anti-infectives   Start     Dose/Rate Route Frequency Ordered Stop   02/08/13 1000  levofloxacin (LEVAQUIN) tablet 750 mg    Comments:  Continue for seven days   750 mg Oral Daily 02/07/13 1809     02/08/13 0000  levofloxacin (LEVAQUIN) 750 MG tablet    Comments:  Take for 10 days   750 mg Oral Daily 02/08/13 0615     02/07/13 1700  levofloxacin (LEVAQUIN) tablet 750 mg  Status:  Discontinued     750 mg Oral Daily 02/07/13 1550 02/07/13 1809   02/05/13 2200  clindamycin (CLEOCIN) IVPB 600 mg     600 mg 100 mL/hr over 30 Minutes Intravenous Every 6 hours 02/05/13 1738 02/06/13 0447   02/05/13 1136  clindamycin (CLEOCIN) IVPB 900 mg     900 mg 100 mL/hr over 30 Minutes Intravenous On call to O.R. 02/05/13 1136 02/05/13 1418     and started on DVT  prophylaxis in the form of Lovenox and Coumadin.   PT and OT were ordered for total joint protocol.  Discharge planning consulted to help with postop disposition and equipment needs.  Patient had a good night on the evening of surgery.  They started to get up OOB with therapy on day one. Hemovac drain was pulled without difficulty.  Continued to work with therapy into day two.  Dressing was left in place.  It will remain in place until follow up with Dr. Charlann Boxer in the office. Patient was seen in rounds on day three and was ready to go home to the SNF later that same day.  Please note that the patient may shower but leave the dressing in place until follow up in the office.  Discharge Medications: Prior to Admission medications   Medication Sig Start Date End Date Taking? Authorizing Provider  acetaminophen (TYLENOL) 500 MG tablet Take 1,000 mg by mouth every 6 (six) hours as needed for pain.   Yes Historical Provider, MD  diltiazem (TIAZAC) 240 MG 24 hr capsule Take 240 mg by mouth every morning.   Yes Historical Provider, MD  esomeprazole (NEXIUM) 20 MG capsule Take 20 mg by mouth daily before breakfast. WILL START WHEN FINISHES PRILOSEC   Yes Historical Provider, MD  furosemide (LASIX) 40 MG tablet Take 40 mg by mouth daily.    Yes Historical Provider, MD  guaiFENesin (MUCINEX) 600 MG 12 hr tablet Take 600 mg by mouth 2 (two) times daily as needed for congestion.   Yes Historical Provider, MD  metoprolol (TOPROL-XL) 50 MG 24 hr tablet Take 50 mg by mouth at bedtime.    Yes Historical Provider, MD  rosuvastatin (CRESTOR) 5 MG tablet Take 5 mg by mouth at bedtime.    Yes Historical Provider, MD  spironolactone (ALDACTONE) 25 MG tablet Take 25 mg by mouth daily.   Yes Historical Provider, MD  calcium carbonate (OS-CAL - DOSED IN MG OF ELEMENTAL CALCIUM) 1250 MG tablet Take 1 tablet by mouth daily.    Historical Provider, MD  cholecalciferol (VITAMIN D) 1000 UNITS tablet Take 1,000 Units by mouth daily.     Historical Provider, MD  Cinnamon 500 MG capsule Take 1,000 mg by mouth daily.    Historical Provider, MD  docusate sodium (COLACE) 100 MG capsule Take 200 mg by mouth daily as needed for constipation.    Historical Provider, MD  enoxaparin (LOVENOX) 40 MG/0.4ML injection Inject 0.4 mLs (40 mg total) into the skin daily. STARTS 01-31-2013 AND LAST DOSE 02-04-2013 02/08/13   Shelda Pal, MD  ferrous sulfate 325 (65 FE) MG tablet Take 1 tablet (325 mg total) by mouth 3 (three) times daily after meals. 02/08/13   Shelda Pal, MD  fish oil-omega-3 fatty acids 1000 MG capsule Take 1 g by mouth 2 (two) times daily.     Historical Provider, MD  HYDROcodone-acetaminophen (NORCO) 7.5-325 MG per tablet Take 1-2 tablets by mouth every 4 (four) hours. 02/08/13   Shelda Pal, MD  levofloxacin (LEVAQUIN) 750 MG tablet Take 1 tablet (750 mg total) by mouth daily. 02/08/13   Shelda Pal, MD  methocarbamol (ROBAXIN) 500 MG tablet Take 1 tablet (500 mg total) by mouth every 6 (six) hours as needed. 02/08/13   Shelda Pal, MD  Multiple Vitamins-Minerals (MULTIVITAMINS THER. W/MINERALS) TABS Take 1 tablet by mouth daily.     Historical Provider, MD  omeprazole (PRILOSEC) 20 MG capsule Take 20 mg by mouth 2 (two) times daily.    Historical Provider, MD  polyethylene glycol (MIRALAX / GLYCOLAX) packet Take 17 g by mouth daily as needed. 02/08/13   Shelda Pal, MD  warfarin (COUMADIN) 5 MG tablet Take 5 mg by mouth daily. Take Coumadin for three weeks for postoperative protocol and then the patient may resume their previous Coumadin home regimen.  The dose may need to be adjusted based upon the INR.  Please follow the INR and titrate Coumadin dose for a therapeutic range between 2.0 and 3.0 INR.  After completing the three weeks of Coumadin, the patient may resume their previous Coumadin home regimen.     Historical Provider, MD    Diet: Cardiac diet Activity:WBAT Follow-up:in 12 days Disposition - Skilled  nursing facility - [facility] Discharged Condition: good   Discharge Orders   Future Orders Complete By Expires     Call MD / Call 911  As directed     Comments:      If you experience chest pain or shortness of breath, CALL 911 and be transported to the hospital emergency room.  If you develope a fever above 101 F, pus (white drainage) or increased drainage or redness at the wound, or calf pain, call your surgeon's office.    Constipation Prevention  As directed     Comments:      Drink plenty of fluids.  Prune juice may be helpful.  You may use a stool softener, such as Colace (over the counter) 100 mg twice a day.  Use MiraLax (over the counter) for constipation as needed.    Diet - low sodium heart healthy  As directed     Discharge instructions  As directed     Comments:      Pick up stool softner and laxative for home. Do not submerge incision under water. May shower. Continue to use ice for pain and swelling from surgery.  Take Coumadin for three weeks for postoperative protocol and then the patient may resume their previous Coumadin home regimen.  The dose may need to be adjusted based upon the INR.  Please follow the INR and titrate Coumadin dose for a therapeutic range between 2.0 and 3.0 INR.  After completing the three weeks of Coumadin, the patient may resume their previous Coumadin home regimen.  Continue Lovenox injections until the INR is therapeutic at or greater than 2.0.  When INR reaches the therapeutic level of equal to or greater than 2.0, the patient may discontinue the Lovenox injections.  When discharged from the skilled rehab facility, please have the facility set up the patient's Home Health Physical Therapy prior to being released.  Also provide the patient with their medications at time of release from the facility to include their pain medication, the muscle relaxants, and their blood thinner medication.  If the patient is still at the rehab facility at time of  follow up appointment, please also assist the patient in arranging follow up appointment in our office and any transportation needs.    Do not put a pillow under the knee. Place it under the heel.  As directed     Do not sit on low chairs, stoools or toilet seats, as it may be difficult to get up from low surfaces  As directed     Driving restrictions  As directed     Comments:      No driving until released by the physician.    Increase activity slowly as tolerated  As directed     Lifting restrictions  As directed     Comments:      No lifting until released by the physician.    Patient may shower  As directed     Comments:      The patient may shower with dressing in place.  DO NOT CHANGE DRESSING.  Leave in place until follow up with Dr. Charlann Boxer in the office.    TED hose  As directed     Comments:      Use stockings (TED hose) for 3 weeks on both leg(s).  You may remove them at night for sleeping.    Weight bearing as tolerated  As directed         Medication List    TAKE these medications       acetaminophen 500 MG tablet  Commonly known as:  TYLENOL  Take 1,000 mg by mouth every 6 (six) hours as needed for pain.     calcium carbonate 1250 MG tablet  Commonly known as:  OS-CAL - dosed in mg of elemental calcium  Take 1 tablet by mouth daily.     cholecalciferol 1000 UNITS tablet  Commonly known as:  VITAMIN D  Take 1,000 Units by mouth daily.     Cinnamon 500 MG capsule  Take 1,000 mg by mouth daily.     diltiazem 240 MG 24 hr capsule  Commonly known as:  TIAZAC  Take 240 mg by mouth every morning.     docusate sodium 100 MG capsule  Commonly known as:  COLACE  Take 200 mg by mouth daily as needed for constipation.     enoxaparin 40 MG/0.4ML injection  Commonly  known as:  LOVENOX  Inject 0.4 mLs (40 mg total) into the skin daily. STARTS 01-31-2013 AND LAST DOSE 02-04-2013     esomeprazole 20 MG capsule  Commonly known as:  NEXIUM  Take 20 mg by mouth daily before  breakfast. WILL START WHEN FINISHES PRILOSEC     ferrous sulfate 325 (65 FE) MG tablet  Take 1 tablet (325 mg total) by mouth 3 (three) times daily after meals.     fish oil-omega-3 fatty acids 1000 MG capsule  Take 1 g by mouth 2 (two) times daily.     furosemide 40 MG tablet  Commonly known as:  LASIX  Take 40 mg by mouth daily.     guaiFENesin 600 MG 12 hr tablet  Commonly known as:  MUCINEX  Take 600 mg by mouth 2 (two) times daily as needed for congestion.     HYDROcodone-acetaminophen 7.5-325 MG per tablet  Commonly known as:  NORCO  Take 1-2 tablets by mouth every 4 (four) hours.     levofloxacin 750 MG tablet  Commonly known as:  LEVAQUIN  Take 1 tablet (750 mg total) by mouth daily.     methocarbamol 500 MG tablet  Commonly known as:  ROBAXIN  Take 1 tablet (500 mg total) by mouth every 6 (six) hours as needed.     metoprolol succinate 50 MG 24 hr tablet  Commonly known as:  TOPROL-XL  Take 50 mg by mouth at bedtime.     multivitamins ther. w/minerals Tabs  Take 1 tablet by mouth daily.     polyethylene glycol packet  Commonly known as:  MIRALAX / GLYCOLAX  Take 17 g by mouth daily as needed.     PRILOSEC 20 MG capsule  Generic drug:  omeprazole  Take 20 mg by mouth 2 (two) times daily.     rosuvastatin 5 MG tablet  Commonly known as:  CRESTOR  Take 5 mg by mouth at bedtime.     spironolactone 25 MG tablet  Commonly known as:  ALDACTONE  Take 25 mg by mouth daily.     warfarin 5 MG tablet  Commonly known as:  COUMADIN  Take 5 mg by mouth daily.           Follow-up Information   Follow up with Shelda Pal, MD. Schedule an appointment as soon as possible for a visit in 12 days.   Contact information:   [address], [address] [place] [address] [phone]       Signed: Patrica Duel 02/08/2013, 9:13 AM

## 2013-02-08 NOTE — Progress Notes (Signed)
Clinical Social Work Department CLINICAL SOCIAL WORK PLACEMENT NOTE 02/08/2013  Patient:  [name], [name]  Account Number:  [id number] Admit date:  02/05/2013  Clinical Social Worker:  Cori Razor, LCSW  Date/time:  02/06/2013 01:23 PM  Clinical Social Work is seeking post-discharge placement for this patient at the following level of care:   SKILLED NURSING   (*CSW will update this form in Epic as items are completed)   02/06/2013  Patient/family provided with [facility] Department of Clinical Social Work's list of facilities offering this level of care within the geographic area requested by the patient (or if unable, by the patient's family).  02/06/2013  Patient/family informed of their freedom to choose among providers that offer the needed level of care, that participate in Medicare, Medicaid or managed care program needed by the patient, have an available bed and are willing to accept the patient.  02/06/2013  Patient/family informed of [facility]' ownership interest in [facility], as well as of the fact that they are under no obligation to receive care at this facility.  PASARR submitted to EDS on 02/05/2013 PASARR number received from EDS on 02/05/2013  FL2 transmitted to all facilities in geographic area requested by pt/family on  02/06/2013 FL2 transmitted to all facilities within larger geographic area on   Patient informed that his/her managed care company has contracts with or will negotiate with  certain facilities, including the following:     Patient/family informed of bed offers received:  02/06/2013 Patient chooses bed at [place], [facility] Physician recommends and patient chooses bed at    Patient to be transferred to [place], [facility] on  02/08/2013 Patient to be transferred to facility by [facility]  The following physician request were entered in Epic:   Additional Comments:  Cori Razor LCSW [phone]

## 2013-02-08 NOTE — Progress Notes (Signed)
   Subjective: 3 Days Post-Op Procedure(s) (LRB): TOTAL KNEE ARTHROPLASTY (Right) Patient reports pain as mild.   Patient seen in rounds by Dr. Charlann Boxer. Patient is well, and has had no acute complaints or problems Patient is ready to go to the SNF - [facility].  Objective: Vital signs in last 24 hours: Temp:  [97.3 F (36.3 C)-98.3 F (36.8 C)] 98.3 F (36.8 C) (02/27 0543) Pulse Rate:  [63-83] 63 (02/27 0543) Resp:  [18-20] 18 (02/27 0543) BP: (108-132)/(65-73) 118/73 mmHg (02/27 0543) SpO2:  [92 %-96 %] 94 % (02/27 0543)  Intake/Output from previous day:  Intake/Output Summary (Last 24 hours) at 02/08/13 0907 Last data filed at 02/08/13 0500  Gross per 24 hour  Intake    360 ml  Output   2550 ml  Net  -2190 ml    Intake/Output this shift:    Labs:  Recent Labs  02/06/13 0420 02/07/13 0439  HGB 9.8* 9.0*    Recent Labs  02/06/13 0420 02/07/13 0439  WBC 8.5 12.4*  RBC 3.03* 2.77*  HCT 28.2* 25.7*  PLT 177 186    Recent Labs  02/06/13 0420 02/07/13 0439  NA 125* 128*  K 3.8 4.4  CL 92* 94*  CO2 24 27  BUN 8 12  CREATININE 0.53 0.65  GLUCOSE 229* 194*  CALCIUM 8.2* 8.5    Recent Labs  02/07/13 0439 02/08/13 0820  INR 1.21 1.72*    EXAM: General - Patient is Alert, Appropriate and Oriented Extremity - Neurovascular intact Sensation intact distally Dorsiflexion/Plantar flexion intact Dressing - clean and dry Motor Function - intact, moving foot and toes well on exam.   Assessment/Plan: 3 Days Post-Op Procedure(s) (LRB): TOTAL KNEE ARTHROPLASTY (Right) Procedure(s) (LRB): TOTAL KNEE ARTHROPLASTY (Right) Past Medical History  Diagnosis Date  . Coronary artery disease   . GERD (gastroesophageal reflux disease)   . Headache   . High cholesterol   . DVT (deep venous thrombosis), right 2005    "dry blood; after 8 foot fall"  . Chronic bronchitis     "get it basically 3 times/yr"  . Blood transfusion 1960  . H/O hiatal hernia   .  Rheumatoid arthritis   . Concussion w/o coma 03/31/2004    "even now has times when she's not able to comprehend" (05/08/12)  . Atrial fibrillation   . Pneumonia ~ 2010 AND 2013  . Bladder cancer dx'd 2011  . Diverticulitis    Active Problems:   * No active hospital problems. *  Estimated body mass index is 26.96 kg/(m^2) as calculated from the following:   Height as of this encounter: 5\' 5"  (1.651 m).   Weight as of this encounter: 73.483 kg (162 lb). Discharge to SNF Diet - Cardiac diet Follow up - in 2 weeks Activity - WBAT Disposition - Skilled nursing facility Condition Upon Discharge - Good D/C Meds - See DC Summary DVT Prophylaxis - Lovenox and Coumadin  ,  02/08/2013, 9:07 AM

## 2013-02-08 NOTE — Care Management Note (Signed)
    Page 1 of 1   02/08/2013     5:43:52 PM   CARE MANAGEMENT NOTE 02/08/2013  Patient:  JAYAH, BALTHAZAR   Account Number:  [id number]  Date Initiated:  02/08/2013  Documentation initiated by:  Colleen Can  Subjective/Objective Assessment:   DX RT KNEE OSTEOARTHRITIS, TOTAL KNEE REPLACEMNT     Action/Plan:   SNF REHAB   Anticipated DC Date:  02/08/2013   Anticipated DC Plan:  SKILLED NURSING FACILITY  In-house referral  Clinical Social Worker      DC Planning Services  CM consult      Choice offered to / List presented to:             Status of service:  Completed, signed off Medicare Important Message given?  NA - LOS <3 / Initial given by admissions (If response is "NO", the following Medicare IM given date fields will be blank) Date Medicare IM given:   Date Additional Medicare IM given:    Discharge Disposition:  HOME W HOME HEALTH SERVICES  Per UR Regulation:    If discussed at Long Length of Stay Meetings, dates discussed:    Comments:

## 2013-02-08 NOTE — Progress Notes (Deleted)
Clinical Social Work Department CLINICAL SOCIAL WORK PLACEMENT NOTE 02/08/2013  Patient:  [name],[name]  Account Number:  [id number] Admit date:  02/05/2013  Clinical Social Worker:   , LCSW  Date/time:  02/06/2013 02:00 PM  Clinical Social Work is seeking post-discharge placement for this patient at the following level of care:   SKILLED NURSING   (*CSW will update this form in Epic as items are completed)   02/06/2013  Patient/family provided with [facility] Department of Clinical Social Work'Chambers list of facilities offering this level of care within the geographic area requested by the patient (or if unable, by the patient'Chambers family).  02/06/2013  Patient/family informed of their freedom to choose among providers that offer the needed level of care, that participate in Medicare, Medicaid or managed care program needed by the patient, have an available bed and are willing to accept the patient.    Patient/family informed of [facility]' ownership interest in [facility], as well as of the fact that they are under no obligation to receive care at this facility.  PASARR submitted to EDS on 02/06/2013 PASARR number received from EDS on 02/06/2013  FL2 transmitted to all facilities in geographic area requested by pt/family on  02/06/2013 FL2 transmitted to all facilities within larger geographic area on   Patient informed that his/her managed care company has contracts with or will negotiate with  certain facilities, including the following:     Patient/family informed of bed offers received:  02/07/2013 Patient chooses bed at [facility] [place] Physician recommends and patient chooses bed at    Patient to be transferred to [facility] [place] on  02/08/2013 Patient to be transferred to facility by [facility]  The following physician request were entered in Epic:   Additional Comments:    LCSW [phone] 

## 2013-02-09 DIAGNOSIS — M1990 Unspecified osteoarthritis, unspecified site: Secondary | ICD-10-CM | POA: Diagnosis not present

## 2013-02-09 DIAGNOSIS — I259 Chronic ischemic heart disease, unspecified: Secondary | ICD-10-CM | POA: Diagnosis not present

## 2013-02-09 DIAGNOSIS — Z471 Aftercare following joint replacement surgery: Secondary | ICD-10-CM | POA: Diagnosis not present

## 2013-02-09 DIAGNOSIS — I4891 Unspecified atrial fibrillation: Secondary | ICD-10-CM | POA: Diagnosis not present

## 2013-02-20 DIAGNOSIS — R1904 Left lower quadrant abdominal swelling, mass and lump: Secondary | ICD-10-CM | POA: Diagnosis not present

## 2013-02-20 DIAGNOSIS — K5900 Constipation, unspecified: Secondary | ICD-10-CM | POA: Diagnosis not present

## 2013-03-10 DIAGNOSIS — M069 Rheumatoid arthritis, unspecified: Secondary | ICD-10-CM | POA: Diagnosis not present

## 2013-03-10 DIAGNOSIS — Z471 Aftercare following joint replacement surgery: Secondary | ICD-10-CM | POA: Diagnosis not present

## 2013-03-10 DIAGNOSIS — I2510 Atherosclerotic heart disease of native coronary artery without angina pectoris: Secondary | ICD-10-CM | POA: Diagnosis not present

## 2013-03-10 DIAGNOSIS — IMO0001 Reserved for inherently not codable concepts without codable children: Secondary | ICD-10-CM | POA: Diagnosis not present

## 2013-03-10 DIAGNOSIS — Z96659 Presence of unspecified artificial knee joint: Secondary | ICD-10-CM | POA: Diagnosis not present

## 2013-03-12 DIAGNOSIS — M069 Rheumatoid arthritis, unspecified: Secondary | ICD-10-CM | POA: Diagnosis not present

## 2013-03-12 DIAGNOSIS — I2510 Atherosclerotic heart disease of native coronary artery without angina pectoris: Secondary | ICD-10-CM | POA: Diagnosis not present

## 2013-03-12 DIAGNOSIS — Z471 Aftercare following joint replacement surgery: Secondary | ICD-10-CM | POA: Diagnosis not present

## 2013-03-12 DIAGNOSIS — Z96659 Presence of unspecified artificial knee joint: Secondary | ICD-10-CM | POA: Diagnosis not present

## 2013-03-12 DIAGNOSIS — IMO0001 Reserved for inherently not codable concepts without codable children: Secondary | ICD-10-CM | POA: Diagnosis not present

## 2013-03-13 DIAGNOSIS — Z96659 Presence of unspecified artificial knee joint: Secondary | ICD-10-CM | POA: Diagnosis not present

## 2013-03-13 DIAGNOSIS — Z471 Aftercare following joint replacement surgery: Secondary | ICD-10-CM | POA: Diagnosis not present

## 2013-03-13 DIAGNOSIS — I2510 Atherosclerotic heart disease of native coronary artery without angina pectoris: Secondary | ICD-10-CM | POA: Diagnosis not present

## 2013-03-13 DIAGNOSIS — IMO0001 Reserved for inherently not codable concepts without codable children: Secondary | ICD-10-CM | POA: Diagnosis not present

## 2013-03-13 DIAGNOSIS — M069 Rheumatoid arthritis, unspecified: Secondary | ICD-10-CM | POA: Diagnosis not present

## 2013-03-15 DIAGNOSIS — I2510 Atherosclerotic heart disease of native coronary artery without angina pectoris: Secondary | ICD-10-CM | POA: Diagnosis not present

## 2013-03-15 DIAGNOSIS — IMO0001 Reserved for inherently not codable concepts without codable children: Secondary | ICD-10-CM | POA: Diagnosis not present

## 2013-03-15 DIAGNOSIS — Z471 Aftercare following joint replacement surgery: Secondary | ICD-10-CM | POA: Diagnosis not present

## 2013-03-15 DIAGNOSIS — Z96659 Presence of unspecified artificial knee joint: Secondary | ICD-10-CM | POA: Diagnosis not present

## 2013-03-15 DIAGNOSIS — M069 Rheumatoid arthritis, unspecified: Secondary | ICD-10-CM | POA: Diagnosis not present

## 2013-03-16 DIAGNOSIS — Z471 Aftercare following joint replacement surgery: Secondary | ICD-10-CM | POA: Diagnosis not present

## 2013-03-16 DIAGNOSIS — I2510 Atherosclerotic heart disease of native coronary artery without angina pectoris: Secondary | ICD-10-CM | POA: Diagnosis not present

## 2013-03-16 DIAGNOSIS — M069 Rheumatoid arthritis, unspecified: Secondary | ICD-10-CM | POA: Diagnosis not present

## 2013-03-16 DIAGNOSIS — IMO0001 Reserved for inherently not codable concepts without codable children: Secondary | ICD-10-CM | POA: Diagnosis not present

## 2013-03-16 DIAGNOSIS — Z96659 Presence of unspecified artificial knee joint: Secondary | ICD-10-CM | POA: Diagnosis not present

## 2013-03-19 DIAGNOSIS — I2510 Atherosclerotic heart disease of native coronary artery without angina pectoris: Secondary | ICD-10-CM | POA: Diagnosis not present

## 2013-03-19 DIAGNOSIS — IMO0001 Reserved for inherently not codable concepts without codable children: Secondary | ICD-10-CM | POA: Diagnosis not present

## 2013-03-19 DIAGNOSIS — Z471 Aftercare following joint replacement surgery: Secondary | ICD-10-CM | POA: Diagnosis not present

## 2013-03-19 DIAGNOSIS — M069 Rheumatoid arthritis, unspecified: Secondary | ICD-10-CM | POA: Diagnosis not present

## 2013-03-19 DIAGNOSIS — Z96659 Presence of unspecified artificial knee joint: Secondary | ICD-10-CM | POA: Diagnosis not present

## 2013-03-20 DIAGNOSIS — I2510 Atherosclerotic heart disease of native coronary artery without angina pectoris: Secondary | ICD-10-CM | POA: Diagnosis not present

## 2013-03-20 DIAGNOSIS — IMO0001 Reserved for inherently not codable concepts without codable children: Secondary | ICD-10-CM | POA: Diagnosis not present

## 2013-03-20 DIAGNOSIS — Z471 Aftercare following joint replacement surgery: Secondary | ICD-10-CM | POA: Diagnosis not present

## 2013-03-20 DIAGNOSIS — M069 Rheumatoid arthritis, unspecified: Secondary | ICD-10-CM | POA: Diagnosis not present

## 2013-03-20 DIAGNOSIS — Z96659 Presence of unspecified artificial knee joint: Secondary | ICD-10-CM | POA: Diagnosis not present

## 2013-03-22 DIAGNOSIS — IMO0001 Reserved for inherently not codable concepts without codable children: Secondary | ICD-10-CM | POA: Diagnosis not present

## 2013-03-22 DIAGNOSIS — Z96659 Presence of unspecified artificial knee joint: Secondary | ICD-10-CM | POA: Diagnosis not present

## 2013-03-22 DIAGNOSIS — M25569 Pain in unspecified knee: Secondary | ICD-10-CM | POA: Diagnosis not present

## 2013-03-22 DIAGNOSIS — M069 Rheumatoid arthritis, unspecified: Secondary | ICD-10-CM | POA: Diagnosis not present

## 2013-03-22 DIAGNOSIS — Z471 Aftercare following joint replacement surgery: Secondary | ICD-10-CM | POA: Diagnosis not present

## 2013-03-22 DIAGNOSIS — I2510 Atherosclerotic heart disease of native coronary artery without angina pectoris: Secondary | ICD-10-CM | POA: Diagnosis not present

## 2013-03-23 DIAGNOSIS — I2510 Atherosclerotic heart disease of native coronary artery without angina pectoris: Secondary | ICD-10-CM | POA: Diagnosis not present

## 2013-03-23 DIAGNOSIS — Z471 Aftercare following joint replacement surgery: Secondary | ICD-10-CM | POA: Diagnosis not present

## 2013-03-23 DIAGNOSIS — Z96659 Presence of unspecified artificial knee joint: Secondary | ICD-10-CM | POA: Diagnosis not present

## 2013-03-23 DIAGNOSIS — IMO0001 Reserved for inherently not codable concepts without codable children: Secondary | ICD-10-CM | POA: Diagnosis not present

## 2013-03-23 DIAGNOSIS — M069 Rheumatoid arthritis, unspecified: Secondary | ICD-10-CM | POA: Diagnosis not present

## 2013-03-26 DIAGNOSIS — R300 Dysuria: Secondary | ICD-10-CM | POA: Diagnosis not present

## 2013-03-26 DIAGNOSIS — M069 Rheumatoid arthritis, unspecified: Secondary | ICD-10-CM | POA: Diagnosis not present

## 2013-03-26 DIAGNOSIS — IMO0001 Reserved for inherently not codable concepts without codable children: Secondary | ICD-10-CM | POA: Diagnosis not present

## 2013-03-26 DIAGNOSIS — C671 Malignant neoplasm of dome of bladder: Secondary | ICD-10-CM | POA: Diagnosis not present

## 2013-03-26 DIAGNOSIS — Z8551 Personal history of malignant neoplasm of bladder: Secondary | ICD-10-CM | POA: Diagnosis not present

## 2013-03-26 DIAGNOSIS — I2510 Atherosclerotic heart disease of native coronary artery without angina pectoris: Secondary | ICD-10-CM | POA: Diagnosis not present

## 2013-03-26 DIAGNOSIS — Z471 Aftercare following joint replacement surgery: Secondary | ICD-10-CM | POA: Diagnosis not present

## 2013-03-26 DIAGNOSIS — N3941 Urge incontinence: Secondary | ICD-10-CM | POA: Diagnosis not present

## 2013-03-26 DIAGNOSIS — Z96659 Presence of unspecified artificial knee joint: Secondary | ICD-10-CM | POA: Diagnosis not present

## 2013-03-28 DIAGNOSIS — Z471 Aftercare following joint replacement surgery: Secondary | ICD-10-CM | POA: Diagnosis not present

## 2013-03-28 DIAGNOSIS — I2510 Atherosclerotic heart disease of native coronary artery without angina pectoris: Secondary | ICD-10-CM | POA: Diagnosis not present

## 2013-03-28 DIAGNOSIS — Z96659 Presence of unspecified artificial knee joint: Secondary | ICD-10-CM | POA: Diagnosis not present

## 2013-03-28 DIAGNOSIS — IMO0001 Reserved for inherently not codable concepts without codable children: Secondary | ICD-10-CM | POA: Diagnosis not present

## 2013-03-28 DIAGNOSIS — M069 Rheumatoid arthritis, unspecified: Secondary | ICD-10-CM | POA: Diagnosis not present

## 2013-03-30 DIAGNOSIS — Z471 Aftercare following joint replacement surgery: Secondary | ICD-10-CM | POA: Diagnosis not present

## 2013-03-30 DIAGNOSIS — IMO0001 Reserved for inherently not codable concepts without codable children: Secondary | ICD-10-CM | POA: Diagnosis not present

## 2013-03-30 DIAGNOSIS — I2510 Atherosclerotic heart disease of native coronary artery without angina pectoris: Secondary | ICD-10-CM | POA: Diagnosis not present

## 2013-03-30 DIAGNOSIS — M069 Rheumatoid arthritis, unspecified: Secondary | ICD-10-CM | POA: Diagnosis not present

## 2013-03-30 DIAGNOSIS — Z96659 Presence of unspecified artificial knee joint: Secondary | ICD-10-CM | POA: Diagnosis not present

## 2013-04-02 DIAGNOSIS — I2510 Atherosclerotic heart disease of native coronary artery without angina pectoris: Secondary | ICD-10-CM | POA: Diagnosis not present

## 2013-04-02 DIAGNOSIS — M069 Rheumatoid arthritis, unspecified: Secondary | ICD-10-CM | POA: Diagnosis not present

## 2013-04-02 DIAGNOSIS — IMO0001 Reserved for inherently not codable concepts without codable children: Secondary | ICD-10-CM | POA: Diagnosis not present

## 2013-04-02 DIAGNOSIS — Z471 Aftercare following joint replacement surgery: Secondary | ICD-10-CM | POA: Diagnosis not present

## 2013-04-02 DIAGNOSIS — Z96659 Presence of unspecified artificial knee joint: Secondary | ICD-10-CM | POA: Diagnosis not present

## 2013-04-03 ENCOUNTER — Other Ambulatory Visit: Payer: Self-pay | Admitting: Internal Medicine

## 2013-04-03 DIAGNOSIS — M069 Rheumatoid arthritis, unspecified: Secondary | ICD-10-CM | POA: Diagnosis not present

## 2013-04-03 DIAGNOSIS — I10 Essential (primary) hypertension: Secondary | ICD-10-CM | POA: Diagnosis not present

## 2013-04-03 DIAGNOSIS — Z7901 Long term (current) use of anticoagulants: Secondary | ICD-10-CM | POA: Diagnosis not present

## 2013-04-03 DIAGNOSIS — I2510 Atherosclerotic heart disease of native coronary artery without angina pectoris: Secondary | ICD-10-CM | POA: Diagnosis not present

## 2013-04-03 DIAGNOSIS — Z96659 Presence of unspecified artificial knee joint: Secondary | ICD-10-CM | POA: Diagnosis not present

## 2013-04-03 DIAGNOSIS — Z471 Aftercare following joint replacement surgery: Secondary | ICD-10-CM | POA: Diagnosis not present

## 2013-04-03 DIAGNOSIS — R1900 Intra-abdominal and pelvic swelling, mass and lump, unspecified site: Secondary | ICD-10-CM

## 2013-04-03 DIAGNOSIS — IMO0001 Reserved for inherently not codable concepts without codable children: Secondary | ICD-10-CM | POA: Diagnosis not present

## 2013-04-03 DIAGNOSIS — I4891 Unspecified atrial fibrillation: Secondary | ICD-10-CM | POA: Diagnosis not present

## 2013-04-03 DIAGNOSIS — R7309 Other abnormal glucose: Secondary | ICD-10-CM | POA: Diagnosis not present

## 2013-04-05 DIAGNOSIS — Z96659 Presence of unspecified artificial knee joint: Secondary | ICD-10-CM | POA: Diagnosis not present

## 2013-04-05 DIAGNOSIS — I2510 Atherosclerotic heart disease of native coronary artery without angina pectoris: Secondary | ICD-10-CM | POA: Diagnosis not present

## 2013-04-05 DIAGNOSIS — M069 Rheumatoid arthritis, unspecified: Secondary | ICD-10-CM | POA: Diagnosis not present

## 2013-04-05 DIAGNOSIS — Z471 Aftercare following joint replacement surgery: Secondary | ICD-10-CM | POA: Diagnosis not present

## 2013-04-05 DIAGNOSIS — IMO0001 Reserved for inherently not codable concepts without codable children: Secondary | ICD-10-CM | POA: Diagnosis not present

## 2013-04-09 DIAGNOSIS — M069 Rheumatoid arthritis, unspecified: Secondary | ICD-10-CM | POA: Diagnosis not present

## 2013-04-09 DIAGNOSIS — IMO0001 Reserved for inherently not codable concepts without codable children: Secondary | ICD-10-CM | POA: Diagnosis not present

## 2013-04-09 DIAGNOSIS — Z96659 Presence of unspecified artificial knee joint: Secondary | ICD-10-CM | POA: Diagnosis not present

## 2013-04-09 DIAGNOSIS — Z471 Aftercare following joint replacement surgery: Secondary | ICD-10-CM | POA: Diagnosis not present

## 2013-04-09 DIAGNOSIS — I2510 Atherosclerotic heart disease of native coronary artery without angina pectoris: Secondary | ICD-10-CM | POA: Diagnosis not present

## 2013-04-10 DIAGNOSIS — M069 Rheumatoid arthritis, unspecified: Secondary | ICD-10-CM | POA: Diagnosis not present

## 2013-04-10 DIAGNOSIS — IMO0001 Reserved for inherently not codable concepts without codable children: Secondary | ICD-10-CM | POA: Diagnosis not present

## 2013-04-10 DIAGNOSIS — Z471 Aftercare following joint replacement surgery: Secondary | ICD-10-CM | POA: Diagnosis not present

## 2013-04-10 DIAGNOSIS — Z96659 Presence of unspecified artificial knee joint: Secondary | ICD-10-CM | POA: Diagnosis not present

## 2013-04-10 DIAGNOSIS — I2510 Atherosclerotic heart disease of native coronary artery without angina pectoris: Secondary | ICD-10-CM | POA: Diagnosis not present

## 2013-04-11 ENCOUNTER — Inpatient Hospital Stay: Admission: RE | Admit: 2013-04-11 | Payer: Medicare Other | Source: Ambulatory Visit

## 2013-04-11 DIAGNOSIS — M069 Rheumatoid arthritis, unspecified: Secondary | ICD-10-CM | POA: Diagnosis not present

## 2013-04-11 DIAGNOSIS — Z96659 Presence of unspecified artificial knee joint: Secondary | ICD-10-CM | POA: Diagnosis not present

## 2013-04-11 DIAGNOSIS — I2510 Atherosclerotic heart disease of native coronary artery without angina pectoris: Secondary | ICD-10-CM | POA: Diagnosis not present

## 2013-04-11 DIAGNOSIS — IMO0001 Reserved for inherently not codable concepts without codable children: Secondary | ICD-10-CM | POA: Diagnosis not present

## 2013-04-11 DIAGNOSIS — Z471 Aftercare following joint replacement surgery: Secondary | ICD-10-CM | POA: Diagnosis not present

## 2013-04-12 ENCOUNTER — Ambulatory Visit
Admission: RE | Admit: 2013-04-12 | Discharge: 2013-04-12 | Disposition: A | Discharge status: Home / Self Care | Payer: Medicare Other | Source: Ambulatory Visit | Attending: Internal Medicine | Admitting: Internal Medicine

## 2013-04-12 DIAGNOSIS — Z96659 Presence of unspecified artificial knee joint: Secondary | ICD-10-CM | POA: Diagnosis not present

## 2013-04-12 DIAGNOSIS — Z471 Aftercare following joint replacement surgery: Secondary | ICD-10-CM | POA: Diagnosis not present

## 2013-04-12 DIAGNOSIS — R1900 Intra-abdominal and pelvic swelling, mass and lump, unspecified site: Secondary | ICD-10-CM

## 2013-04-12 DIAGNOSIS — I2510 Atherosclerotic heart disease of native coronary artery without angina pectoris: Secondary | ICD-10-CM | POA: Diagnosis not present

## 2013-04-12 DIAGNOSIS — M069 Rheumatoid arthritis, unspecified: Secondary | ICD-10-CM | POA: Diagnosis not present

## 2013-04-12 DIAGNOSIS — K5730 Diverticulosis of large intestine without perforation or abscess without bleeding: Secondary | ICD-10-CM | POA: Diagnosis not present

## 2013-04-12 DIAGNOSIS — IMO0001 Reserved for inherently not codable concepts without codable children: Secondary | ICD-10-CM | POA: Diagnosis not present

## 2013-04-12 MED ORDER — IOHEXOL 300 MG/ML  SOLN
100.0000 mL | Freq: Once | INTRAMUSCULAR | Status: AC | PRN
  Administered 2013-04-12: 100 mL via INTRAVENOUS

## 2013-04-13 DIAGNOSIS — M069 Rheumatoid arthritis, unspecified: Secondary | ICD-10-CM | POA: Diagnosis not present

## 2013-04-13 DIAGNOSIS — IMO0001 Reserved for inherently not codable concepts without codable children: Secondary | ICD-10-CM | POA: Diagnosis not present

## 2013-04-13 DIAGNOSIS — Z471 Aftercare following joint replacement surgery: Secondary | ICD-10-CM | POA: Diagnosis not present

## 2013-04-13 DIAGNOSIS — I2510 Atherosclerotic heart disease of native coronary artery without angina pectoris: Secondary | ICD-10-CM | POA: Diagnosis not present

## 2013-04-13 DIAGNOSIS — Z96659 Presence of unspecified artificial knee joint: Secondary | ICD-10-CM | POA: Diagnosis not present

## 2013-04-16 DIAGNOSIS — Z96659 Presence of unspecified artificial knee joint: Secondary | ICD-10-CM | POA: Diagnosis not present

## 2013-04-16 DIAGNOSIS — M069 Rheumatoid arthritis, unspecified: Secondary | ICD-10-CM | POA: Diagnosis not present

## 2013-04-16 DIAGNOSIS — I2510 Atherosclerotic heart disease of native coronary artery without angina pectoris: Secondary | ICD-10-CM | POA: Diagnosis not present

## 2013-04-16 DIAGNOSIS — Z471 Aftercare following joint replacement surgery: Secondary | ICD-10-CM | POA: Diagnosis not present

## 2013-04-16 DIAGNOSIS — IMO0001 Reserved for inherently not codable concepts without codable children: Secondary | ICD-10-CM | POA: Diagnosis not present

## 2013-04-17 DIAGNOSIS — I4891 Unspecified atrial fibrillation: Secondary | ICD-10-CM | POA: Diagnosis not present

## 2013-04-17 DIAGNOSIS — R634 Abnormal weight loss: Secondary | ICD-10-CM | POA: Diagnosis not present

## 2013-04-17 DIAGNOSIS — K632 Fistula of intestine: Secondary | ICD-10-CM | POA: Diagnosis not present

## 2013-04-17 DIAGNOSIS — Z7901 Long term (current) use of anticoagulants: Secondary | ICD-10-CM | POA: Diagnosis not present

## 2013-04-17 DIAGNOSIS — R21 Rash and other nonspecific skin eruption: Secondary | ICD-10-CM | POA: Diagnosis not present

## 2013-04-18 DIAGNOSIS — Z96659 Presence of unspecified artificial knee joint: Secondary | ICD-10-CM | POA: Diagnosis not present

## 2013-04-18 DIAGNOSIS — M069 Rheumatoid arthritis, unspecified: Secondary | ICD-10-CM | POA: Diagnosis not present

## 2013-04-18 DIAGNOSIS — Z471 Aftercare following joint replacement surgery: Secondary | ICD-10-CM | POA: Diagnosis not present

## 2013-04-18 DIAGNOSIS — I2510 Atherosclerotic heart disease of native coronary artery without angina pectoris: Secondary | ICD-10-CM | POA: Diagnosis not present

## 2013-04-18 DIAGNOSIS — IMO0001 Reserved for inherently not codable concepts without codable children: Secondary | ICD-10-CM | POA: Diagnosis not present

## 2013-04-20 DIAGNOSIS — IMO0001 Reserved for inherently not codable concepts without codable children: Secondary | ICD-10-CM | POA: Diagnosis not present

## 2013-04-20 DIAGNOSIS — Z96659 Presence of unspecified artificial knee joint: Secondary | ICD-10-CM | POA: Diagnosis not present

## 2013-04-20 DIAGNOSIS — I2510 Atherosclerotic heart disease of native coronary artery without angina pectoris: Secondary | ICD-10-CM | POA: Diagnosis not present

## 2013-04-20 DIAGNOSIS — M069 Rheumatoid arthritis, unspecified: Secondary | ICD-10-CM | POA: Diagnosis not present

## 2013-04-20 DIAGNOSIS — Z471 Aftercare following joint replacement surgery: Secondary | ICD-10-CM | POA: Diagnosis not present

## 2013-04-23 DIAGNOSIS — H43819 Vitreous degeneration, unspecified eye: Secondary | ICD-10-CM | POA: Diagnosis not present

## 2013-04-24 DIAGNOSIS — I4891 Unspecified atrial fibrillation: Secondary | ICD-10-CM | POA: Diagnosis not present

## 2013-04-24 DIAGNOSIS — Z7901 Long term (current) use of anticoagulants: Secondary | ICD-10-CM | POA: Diagnosis not present

## 2013-05-01 DIAGNOSIS — R634 Abnormal weight loss: Secondary | ICD-10-CM | POA: Diagnosis not present

## 2013-05-03 DIAGNOSIS — Z96659 Presence of unspecified artificial knee joint: Secondary | ICD-10-CM | POA: Diagnosis not present

## 2013-05-04 ENCOUNTER — Other Ambulatory Visit: Payer: Self-pay | Admitting: Internal Medicine

## 2013-05-04 DIAGNOSIS — R634 Abnormal weight loss: Secondary | ICD-10-CM

## 2013-05-09 ENCOUNTER — Other Ambulatory Visit: Payer: Medicare Other

## 2013-05-11 ENCOUNTER — Other Ambulatory Visit: Payer: Medicare Other

## 2013-05-14 ENCOUNTER — Other Ambulatory Visit: Payer: Medicare Other

## 2013-05-15 DIAGNOSIS — Z7901 Long term (current) use of anticoagulants: Secondary | ICD-10-CM | POA: Diagnosis not present

## 2013-05-15 DIAGNOSIS — M810 Age-related osteoporosis without current pathological fracture: Secondary | ICD-10-CM | POA: Diagnosis not present

## 2013-05-15 DIAGNOSIS — I4891 Unspecified atrial fibrillation: Secondary | ICD-10-CM | POA: Diagnosis not present

## 2013-05-16 DIAGNOSIS — Z7901 Long term (current) use of anticoagulants: Secondary | ICD-10-CM | POA: Diagnosis not present

## 2013-05-23 ENCOUNTER — Ambulatory Visit
Admission: RE | Admit: 2013-05-23 | Discharge: 2013-05-23 | Disposition: A | Discharge status: Home / Self Care | Payer: Medicare Other | Source: Ambulatory Visit | Attending: Internal Medicine | Admitting: Internal Medicine

## 2013-05-23 DIAGNOSIS — R634 Abnormal weight loss: Secondary | ICD-10-CM

## 2013-05-23 DIAGNOSIS — K5730 Diverticulosis of large intestine without perforation or abscess without bleeding: Secondary | ICD-10-CM | POA: Diagnosis not present

## 2013-05-23 DIAGNOSIS — I714 Abdominal aortic aneurysm, without rupture: Secondary | ICD-10-CM | POA: Diagnosis not present

## 2013-05-24 ENCOUNTER — Other Ambulatory Visit: Payer: Self-pay | Admitting: Family Medicine

## 2013-05-29 DIAGNOSIS — R109 Unspecified abdominal pain: Secondary | ICD-10-CM | POA: Diagnosis not present

## 2013-05-29 DIAGNOSIS — R7309 Other abnormal glucose: Secondary | ICD-10-CM | POA: Diagnosis not present

## 2013-05-29 DIAGNOSIS — I10 Essential (primary) hypertension: Secondary | ICD-10-CM | POA: Diagnosis not present

## 2013-05-29 DIAGNOSIS — E7800 Pure hypercholesterolemia, unspecified: Secondary | ICD-10-CM | POA: Diagnosis not present

## 2013-05-30 ENCOUNTER — Encounter (INDEPENDENT_AMBULATORY_CARE_PROVIDER_SITE_OTHER): Payer: Self-pay

## 2013-05-30 ENCOUNTER — Telehealth: Payer: Self-pay | Admitting: Internal Medicine

## 2013-05-30 NOTE — Telephone Encounter (Signed)
Spoke with Dennie Bible and scheduled OV with Mike Gip, PA on 05/31/13 at 10:00 AM. Dennie Bible to fax OV notes.

## 2013-05-31 ENCOUNTER — Ambulatory Visit: Payer: Medicare Other | Admitting: Physician Assistant

## 2013-06-11 ENCOUNTER — Encounter: Payer: Self-pay | Admitting: Physician Assistant

## 2013-06-11 ENCOUNTER — Other Ambulatory Visit (INDEPENDENT_AMBULATORY_CARE_PROVIDER_SITE_OTHER): Payer: Medicare Other

## 2013-06-11 ENCOUNTER — Ambulatory Visit (INDEPENDENT_AMBULATORY_CARE_PROVIDER_SITE_OTHER): Payer: Medicare Other | Admitting: Physician Assistant

## 2013-06-11 VITALS — BP 102/60 | HR 80 | Ht 65.0 in | Wt 157.2 lb

## 2013-06-11 DIAGNOSIS — K632 Fistula of intestine: Secondary | ICD-10-CM

## 2013-06-11 DIAGNOSIS — K5732 Diverticulitis of large intestine without perforation or abscess without bleeding: Secondary | ICD-10-CM

## 2013-06-11 LAB — CBC WITH DIFFERENTIAL/PLATELET
Basophils Absolute: 0.1 10*3/uL (ref 0.0–0.1)
Basophils Relative: 0.7 % (ref 0.0–3.0)
Eosinophils Absolute: 0.7 10*3/uL (ref 0.0–0.7)
HCT: 38.8 % (ref 36.0–46.0)
Hemoglobin: 13.1 g/dL (ref 12.0–15.0)
Lymphs Abs: 2.4 10*3/uL (ref 0.7–4.0)
MCHC: 33.7 g/dL (ref 30.0–36.0)
Monocytes Relative: 9.4 % (ref 3.0–12.0)
Neutro Abs: 4.3 10*3/uL (ref 1.4–7.7)
RDW: 14.6 % (ref 11.5–14.6)

## 2013-06-11 LAB — BASIC METABOLIC PANEL
BUN: 14 mg/dL (ref 6–23)
CO2: 25 mEq/L (ref 19–32)
Calcium: 9.8 mg/dL (ref 8.4–10.5)
Creatinine, Ser: 0.8 mg/dL (ref 0.4–1.2)

## 2013-06-11 MED ORDER — CIPROFLOXACIN HCL 500 MG PO TABS: 500.0000 mg | ORAL_TABLET | Freq: Two times a day (BID) | ORAL | Status: DC

## 2013-06-11 MED ORDER — METRONIDAZOLE 500 MG PO TABS: 500.0000 mg | ORAL_TABLET | Freq: Two times a day (BID) | ORAL | Status: DC

## 2013-06-11 MED ORDER — METRONIDAZOLE 500 MG PO TABS: 500.0000 mg | ORAL_TABLET | Freq: Two times a day (BID) | ORAL | Status: AC

## 2013-06-11 MED ORDER — CIPROFLOXACIN HCL 500 MG PO TABS: 500.0000 mg | ORAL_TABLET | Freq: Two times a day (BID) | ORAL | Status: AC

## 2013-06-11 NOTE — Progress Notes (Signed)
Subjective:    Patient ID: [name], female    DOB: [birth year], 79 y.o.   MRN: [id number]  HPI   [name] is a very nice 79 year old white female known to Dr. Lina Sar. She has history of bladder cancer which was resected in November of 2012 and had a recent total knee replacement in February 2014 She last underwent colonoscopy December of 2008 and has severe extensive diverticular disease with deep diverticuli and a narrowed lumen . Medical problems include persistent atrial fibrillation for which she is on Coumadin, history of DVT, hypertension, hyperlipidemia, and rheumatoid arthritis. She is referred currently by her PCP for evaluation of lower abdominal pain and abnormal CT scan. Asian says she first noticed discomfort and a "knot" in her lower abdomen in April. She says she has had long-term problems with GI symptoms and a "sensitive stomach". She has not had any severe abdominal pain but says she has discomfort in her lower abdomen that comes and goes over the past 2 months. She describes this as an aching or sticking type of pain. She has chronic problems with constipation has been using stool softeners with some benefit. She says overall she feels well her appetite has been fine she's not had any fever or chills. She had CT scan of the abdomen and pelvis with contrast 04/12/2013 that showed Descending colon diverticulosis, extensive with an apparent soft tissue tract containing walk to also gas extending from the distal transverse colon to the left lower quadrant small bowel loops could not exclude a coloenteric fistula, there was slight surrounding inflammatory change around this tract. To my knowledge she was not treated with antibiotics at that time. 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   Review of Systems  Constitutional: Negative.   HENT: Negative.   Eyes: Negative.   Respiratory: Negative.   Cardiovascular: Negative.   Gastrointestinal: Positive for abdominal pain.  Endocrine: Negative.   Genitourinary: Negative.   Musculoskeletal: Negative.   Skin: Negative.   Allergic/Immunologic: Negative.   Neurological: Negative.   Hematological: Negative.   Psychiatric/Behavioral: Negative.    Outpatient Prescriptions Prior to Visit  Medication Sig Dispense Refill  . acetaminophen (TYLENOL) 500 MG tablet Take 1,000 mg by mouth every 6 (six) hours as needed for pain.      . calcium carbonate (OS-CAL - DOSED IN MG OF ELEMENTAL CALCIUM) 1250 MG tablet Take 1 tablet by mouth daily.      . cholecalciferol (VITAMIN D) 1000 UNITS tablet Take 1,000 Units by mouth daily.      . Cinnamon 500 MG capsule Take 1,000 mg by mouth daily.      Marland Kitchen diltiazem (TIAZAC) 240 MG 24 hr capsule Take 240 mg by mouth every morning.      . ferrous sulfate 325 (65 FE) MG tablet Take 1 tablet (325 mg total) by mouth 3 (three) times daily after meals.  60 tablet  0  . fish oil-omega-3 fatty acids 1000 MG capsule Take 1 g by mouth 2 (two) times daily.       . furosemide (LASIX) 40 MG tablet TAKE ONE TABLET BY MOUTH ONE TIME DAILY  30 tablet  0  . guaiFENesin (MUCINEX) 600 MG 12 hr tablet Take 600 mg by mouth 2 (two)  times daily as needed for congestion.      Marland Kitchen HYDROcodone-acetaminophen (NORCO) 7.5-325 MG per tablet Take 1-2 tablets by mouth every 4 (four) hours.  120 tablet  0  . methocarbamol (ROBAXIN) 500 MG tablet Take 1 tablet (500 mg total) by mouth every 6 (six) hours as needed.  90 tablet  1  . metoprolol (TOPROL-XL) 50 MG 24 hr tablet Take 50 mg by mouth at bedtime.       . Multiple Vitamins-Minerals (MULTIVITAMINS THER. W/MINERALS) TABS Take 1 tablet by mouth daily.       Marland Kitchen omeprazole (PRILOSEC) 20 MG capsule Take 20 mg by mouth 2 (two) times  daily.      . polyethylene glycol (MIRALAX / GLYCOLAX) packet Take 17 g by mouth daily as needed.  28 each  0  . rosuvastatin (CRESTOR) 5 MG tablet Take 5 mg by mouth at bedtime.       Marland Kitchen spironolactone (ALDACTONE) 25 MG tablet Take 25 mg by mouth daily.      Marland Kitchen warfarin (COUMADIN) 5 MG tablet Take 5 mg by mouth daily.      Marland Kitchen docusate sodium (COLACE) 100 MG capsule Take 200 mg by mouth daily as needed for constipation.      . enoxaparin (LOVENOX) 40 MG/0.4ML injection Inject 0.4 mLs (40 mg total) into the skin daily. STARTS 01-31-2013 AND LAST DOSE 02-04-2013  5 Syringe  0  . esomeprazole (NEXIUM) 20 MG capsule Take 20 mg by mouth daily before breakfast. WILL START WHEN FINISHES PRILOSEC      . levofloxacin (LEVAQUIN) 750 MG tablet Take 1 tablet (750 mg total) by mouth daily.  10 tablet  0   No facility-administered medications prior to visit.   Allergies  Allergen Reactions  . Contrast Media (Iodinated Diagnostic Agents) Hives  . Penicillins Rash    "haven't had it in years"   Patient Active Problem List   Diagnosis Date Noted  . 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 Normal coronary angiogram 1999 after an abnormal stress test 05/10/2012  . Chronic anticoagulation 05/10/2012  . Atrial fibrillation, persistent 05/10/2012  . PVD, moderate bilat carotid disease 05/10/2012  . Normal left ventricular systolic function, 2D 5/11 05/10/2012  . Elevated brain natriuretic peptide (BNP) level- 1558 on admission 05/10/2012  . Dyslipidemia 05/10/2012  . Hypertension   . GERD (gastroesophageal reflux disease)   . DVT (deep venous thrombosis), right   . Pneumonia   . Bladder cancer   . Rheumatoid arthritis   . Bronchitis 11/11/2011  . Primary bladder malignant neoplasm 11/10/2011  . CANDIDIASIS, ESOPHAGEAL 01/26/2008  . COLONIC POLYPS, ADENOMATOUS 01/26/2008  . HEMORRHOIDS, EXTERNAL 01/26/2008  . GASTRITIS, CHRONIC 01/26/2008  . HIATAL HERNIA 01/26/2008  . DIVERTICULOSIS OF COLON 01/26/2008    History  Substance Use Topics  . Smoking status: Former Smoker -- 1.00 packs/day for 50 years    Types: Cigarettes    Quit date: 11/07/1982  . Smokeless tobacco: Never Used  . Alcohol Use: 4.2 oz/week    7 Glasses of wine per week   family history includes Anesthesia problems in her daughter.     Objective:   Physical Exam Well-developed 79 year old white female who appears younger than her stated age. She ambulates with a cane. Blood pressure 102/60 pulse 80 height 5 foot 5 weight 157. HEENT; nontraumatic normocephalic EOMI PERRLA sclera anicteric,neck; Supple no JVD, Cardiovascular; regular rate and rhythm with S1-S2 soft systolic murmur, Pulmonary; clear bilaterally, Abdomen; soft bowel sounds are active she  has a rounded fullness in the left lower quadrant which is mobile and nontender no guarding or rebound, no hepatosplenomegaly,, Rectal; not done, Extremities; no clubbing cyanosis or edema skin warm and dry, Psych ;mood and affect normal and appropriate.      Assessment & Plan:  #16 79 year old white female with persistent lower abdominal discomfort and fullness in the left lower abdomen x2 months with CT scan x2 suggestive of a diverticulitis or other inflammatory process of the left colon with fistulization to the small bowel.  #2 chronic atrial fibrillation #3 chronic anticoagulation #4 history of bladder cancer status post resection November 2012 #5 status post knee replacement February 2014 #6 hypertension #7 rheumatoid arthritis  Plan; start Cipro 500 mg by mouth twice daily x21 days Start Flagyl 500 mg by mouth twice daily x21 days Start a line one by mouth daily Start MiraLax 17 g in 8 ounces of water daily Long discussion with patient regarding CT finding and potential need for surgery. At her advanced age obviously would like to avoid surgery she has not had any progression in her symptoms over the past 6 weeks. Will treat with antibiotics for 3 weeks and  initially, then repeat CT scan of the abdomen and pelvis and plan office followup post CT scan. Surgical consultation thereafter if persistent fistula. Patient is aware that she should call in the interim for any worsening in symptoms increased abdominal pain fever etc.

## 2013-06-11 NOTE — Patient Instructions (Addendum)
Please go to the basement level to have your labs drawn.  We sent prescriptions to [facility] [place] for Cipro and Metronidazole ( Flagyl) You take 1 tab twice daily for 21 days.)  Take 1 dose of Miralax once daily.  We have given you samples of Align, a probiortic, Take 1 capsule daily while on antibioitcs, 21 days.   You have been scheduled for a CT scan of the abdomen and pelvis at [facility] ([address]---this is in the same building as Architectural technologist).   You are scheduled on 07-02-2013 Monday at 11:00 am . You should arrive at 10:45 Am  prior to your appointment time for registration. Please follow the written instructions below on the day of your exam:  WARNING: IF YOU ARE ALLERGIC TO IODINE/X-RAY DYE, PLEASE NOTIFY RADIOLOGY IMMEDIATELY AT [phone]! YOU WILL BE GIVEN A 13 HOUR PREMEDICATION PREP.  1) Do not eat or drink anything after 7:00 am  (4 hours prior to your test) 2) You have been given 2 bottles of oral contrast to drink. The solution may taste  better if refrigerated, but do NOT add ice or any other liquid to this solution. Shake  ell before drinking.    Drink 1 bottle of contrast @ 9:00 am  (2 hours prior to your exam)  Drink 1 bottle of contrast @ 10:00 am (1 hour prior to your exam)  You may take any medications as prescribed with a small amount of water except for the following: Metformin, Glucophage, Glucovance, Avandamet, Riomet, Fortamet, Actoplus Met, Janumet, Glumetza or Metaglip. The above medications must be held the day of the exam AND 48 hours after the exam.  The purpose of you drinking the oral contrast is to aid in the visualization of your intestinal tract. The contrast solution may cause some diarrhea. Before your exam is started, you will be given a small amount of fluid to drink. Depending on your individual set of symptoms, you may also receive an intravenous injection of x-ray contrast/dye. Plan on being at [facility] for  30 minutes or long, depending on the type of exam you are having performed.  If you have any questions regarding your exam or if you need to reschedule, you may call the CT department at [phone] between the hours of 8:00 am and 5:00 pm, Monday-Friday.  ________________________________________________________________________

## 2013-06-11 NOTE — Progress Notes (Signed)
Reviewed and agree with oral antibiotic since she is not toxic and has minimal symptoms but will b e reassessed as to need for resection.

## 2013-06-12 ENCOUNTER — Ambulatory Visit (INDEPENDENT_AMBULATORY_CARE_PROVIDER_SITE_OTHER): Payer: Medicare Other | Admitting: General Surgery

## 2013-06-12 DIAGNOSIS — Z7901 Long term (current) use of anticoagulants: Secondary | ICD-10-CM | POA: Diagnosis not present

## 2013-06-12 DIAGNOSIS — I4891 Unspecified atrial fibrillation: Secondary | ICD-10-CM | POA: Diagnosis not present

## 2013-06-18 ENCOUNTER — Telehealth: Payer: Self-pay | Admitting: *Deleted

## 2013-06-18 DIAGNOSIS — I4891 Unspecified atrial fibrillation: Secondary | ICD-10-CM | POA: Diagnosis not present

## 2013-06-18 DIAGNOSIS — Z7901 Long term (current) use of anticoagulants: Secondary | ICD-10-CM | POA: Diagnosis not present

## 2013-06-18 NOTE — Telephone Encounter (Signed)
I left a message for Mariane Masters, patient's daugher.  I neglected to put the appt in Epic for 07-10-2013 at 10:15 Am.  There is an opening for 07-13-2013 at 3:30 PM.  I asked Gavin Pound to please  Call me back and let me know if that is okay.

## 2013-06-19 ENCOUNTER — Encounter: Payer: Self-pay | Admitting: *Deleted

## 2013-06-21 NOTE — Telephone Encounter (Signed)
I did ask the daughter, Mariane Masters if the appointment with Dr. Juanda Chance on 07-13-2013 at 3:30 PM is acceptable. The daughter said that would be fine for her mother.

## 2013-06-25 ENCOUNTER — Other Ambulatory Visit: Payer: Self-pay | Admitting: Family Medicine

## 2013-06-25 DIAGNOSIS — C679 Malignant neoplasm of bladder, unspecified: Secondary | ICD-10-CM | POA: Diagnosis not present

## 2013-06-25 DIAGNOSIS — Z8551 Personal history of malignant neoplasm of bladder: Secondary | ICD-10-CM | POA: Diagnosis not present

## 2013-06-26 DIAGNOSIS — I4891 Unspecified atrial fibrillation: Secondary | ICD-10-CM | POA: Diagnosis not present

## 2013-06-26 DIAGNOSIS — Z7901 Long term (current) use of anticoagulants: Secondary | ICD-10-CM | POA: Diagnosis not present

## 2013-06-27 NOTE — Telephone Encounter (Signed)
Last seen 10/17/12  ACM

## 2013-06-28 ENCOUNTER — Telehealth: Payer: Self-pay | Admitting: Physician Assistant

## 2013-06-28 NOTE — Telephone Encounter (Signed)
Spoke with patient's caregiver and she missed 2 days of her antibiotics. She will not complete them until 07/05/13. She has a CT scheduled on 07/02/13. She is asking if she needs to reschedule the CT after she completes the antibiotics. She is also concerned because patient is allergic to contrast media. Verified with Rose at [facility]  That she is scheduled for CT without contrast. Please, advise on when patient should get CT.

## 2013-06-28 NOTE — Telephone Encounter (Signed)
Patient's caregiver notified of recommendations.

## 2013-06-28 NOTE — Telephone Encounter (Signed)
I think  She should go ahead with the CT on the day it is scheduled for - ,continue abx until gone.. Please be sure she has an office visit set  Up with Dr. Juanda Chance  Within a few days of the CT scan

## 2013-07-02 ENCOUNTER — Ambulatory Visit (INDEPENDENT_AMBULATORY_CARE_PROVIDER_SITE_OTHER)
Admission: RE | Admit: 2013-07-02 | Discharge: 2013-07-02 | Disposition: A | Discharge status: Home / Self Care | Payer: Medicare Other | Source: Ambulatory Visit | Attending: Physician Assistant | Admitting: Physician Assistant

## 2013-07-02 ENCOUNTER — Encounter (INDEPENDENT_AMBULATORY_CARE_PROVIDER_SITE_OTHER): Payer: Self-pay

## 2013-07-02 DIAGNOSIS — K5732 Diverticulitis of large intestine without perforation or abscess without bleeding: Secondary | ICD-10-CM

## 2013-07-02 DIAGNOSIS — K632 Fistula of intestine: Secondary | ICD-10-CM

## 2013-07-02 DIAGNOSIS — K5730 Diverticulosis of large intestine without perforation or abscess without bleeding: Secondary | ICD-10-CM | POA: Diagnosis not present

## 2013-07-03 DIAGNOSIS — I4891 Unspecified atrial fibrillation: Secondary | ICD-10-CM | POA: Diagnosis not present

## 2013-07-03 DIAGNOSIS — Z7901 Long term (current) use of anticoagulants: Secondary | ICD-10-CM | POA: Diagnosis not present

## 2013-07-04 ENCOUNTER — Ambulatory Visit (INDEPENDENT_AMBULATORY_CARE_PROVIDER_SITE_OTHER): Payer: Medicare Other | Admitting: Cardiology

## 2013-07-04 VITALS — BP 126/62 | Ht 65.0 in | Wt 158.6 lb

## 2013-07-04 DIAGNOSIS — E7800 Pure hypercholesterolemia, unspecified: Secondary | ICD-10-CM | POA: Diagnosis not present

## 2013-07-04 DIAGNOSIS — E785 Hyperlipidemia, unspecified: Secondary | ICD-10-CM

## 2013-07-04 DIAGNOSIS — C679 Malignant neoplasm of bladder, unspecified: Secondary | ICD-10-CM

## 2013-07-04 DIAGNOSIS — Z7901 Long term (current) use of anticoagulants: Secondary | ICD-10-CM | POA: Diagnosis not present

## 2013-07-04 DIAGNOSIS — I4891 Unspecified atrial fibrillation: Secondary | ICD-10-CM

## 2013-07-04 DIAGNOSIS — I482 Chronic atrial fibrillation, unspecified: Secondary | ICD-10-CM

## 2013-07-04 DIAGNOSIS — I82401 Acute embolism and thrombosis of unspecified deep veins of right lower extremity: Secondary | ICD-10-CM

## 2013-07-04 DIAGNOSIS — I10 Essential (primary) hypertension: Secondary | ICD-10-CM

## 2013-07-04 DIAGNOSIS — R609 Edema, unspecified: Secondary | ICD-10-CM

## 2013-07-04 DIAGNOSIS — R600 Localized edema: Secondary | ICD-10-CM

## 2013-07-04 DIAGNOSIS — I82409 Acute embolism and thrombosis of unspecified deep veins of unspecified lower extremity: Secondary | ICD-10-CM

## 2013-07-04 NOTE — Patient Instructions (Addendum)
Take extra doses Lasix  for couple a days, then return to regular dose.   Discuss with Dr Selena Batten about your breathing issues  Your physician wants you to follow-up in 6 months with Dr Herbie Baltimore.  You will receive a reminder letter in the mail two months in advance. If you don't receive a letter, please call our office to schedule the follow-up appointment.

## 2013-07-05 ENCOUNTER — Encounter: Payer: Self-pay | Admitting: Cardiology

## 2013-07-09 DIAGNOSIS — R609 Edema, unspecified: Secondary | ICD-10-CM | POA: Diagnosis not present

## 2013-07-09 DIAGNOSIS — I10 Essential (primary) hypertension: Secondary | ICD-10-CM | POA: Diagnosis not present

## 2013-07-09 DIAGNOSIS — R0989 Other specified symptoms and signs involving the circulatory and respiratory systems: Secondary | ICD-10-CM | POA: Diagnosis not present

## 2013-07-09 DIAGNOSIS — R0609 Other forms of dyspnea: Secondary | ICD-10-CM | POA: Diagnosis not present

## 2013-07-11 ENCOUNTER — Other Ambulatory Visit: Payer: Self-pay | Admitting: Internal Medicine

## 2013-07-11 DIAGNOSIS — R609 Edema, unspecified: Secondary | ICD-10-CM

## 2013-07-12 ENCOUNTER — Ambulatory Visit
Admission: RE | Admit: 2013-07-12 | Discharge: 2013-07-12 | Disposition: A | Discharge status: Home / Self Care | Payer: Medicare Other | Source: Ambulatory Visit | Attending: Internal Medicine | Admitting: Internal Medicine

## 2013-07-12 DIAGNOSIS — R609 Edema, unspecified: Secondary | ICD-10-CM

## 2013-07-13 ENCOUNTER — Encounter: Payer: Self-pay | Admitting: Internal Medicine

## 2013-07-13 ENCOUNTER — Ambulatory Visit (INDEPENDENT_AMBULATORY_CARE_PROVIDER_SITE_OTHER): Payer: Medicare Other | Admitting: Internal Medicine

## 2013-07-13 VITALS — BP 102/60 | HR 80 | Ht 65.0 in | Wt 158.1 lb

## 2013-07-13 DIAGNOSIS — K632 Fistula of intestine: Secondary | ICD-10-CM

## 2013-07-13 DIAGNOSIS — R933 Abnormal findings on diagnostic imaging of other parts of digestive tract: Secondary | ICD-10-CM

## 2013-07-13 NOTE — Patient Instructions (Addendum)
Dr Pearson Grippe

## 2013-07-13 NOTE — Progress Notes (Signed)
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 The fistula   developed after a total  knee replacement  in February .She became constipated patient while  taking of hydrocodone for pain control . She has a history of severe diverticulosis on colonoscopy in December 2008. Last CT scan showed improvement in the inflammatory changes along the sigmoid colon. She was treated with combination of Cipro and Flagyl. She is currently pain-free but still has induration in the left lower quadrant. She takes Metamucil 1 teaspoon daily and stool soft and one or 2 a day she has been on Coumadin for DVT and history of atrial fibrillation. She has a history of bladder cancer and will be seeing her urologist in October of this year and will  go off Coumadin and on Lovenox bridge.   Past Medical History  Diagnosis Date  . Coronary artery disease   . GERD (gastroesophageal reflux disease)   . Headache(784.0)   . High cholesterol   . DVT (deep venous thrombosis), right 2005    "dry blood; after 8 foot fall"  . Chronic bronchitis     "get it basically 3 times/yr"  . Blood transfusion 1960  . H/O hiatal hernia   . Rheumatoid arthritis(714.0)   . Concussion w/o coma 03/31/2004    "even now has times when she's not able to comprehend" (05/08/12)  . Atrial fibrillation   . Pneumonia ~ 2010 AND 2013  . Bladder cancer dx'd 2011  . Diverticulitis   . Diverticulosis   . External hemorrhoids   . Fatty liver   . AAA (abdominal aortic aneurysm)    Past Surgical History  Procedure Laterality Date  . Transurethral resection of bladder tumor  11/11/2011    Procedure: TRANSURETHRAL RESECTION OF BLADDER TUMOR (TURBT);  Surgeon: Anner Crete;  Location: [facility];  Service: Urology;  Laterality: N/A;  Cysto, Bladder Biopsy, TURBT with Gyrus,   . Cystoscopy   11/11/2011    Procedure: CYSTOSCOPY;  Surgeon: Anner Crete;  Location: [facility];  Service: Urology;  Laterality: N/A;  . Bladder lift  YRS AGO  . Bladder cancer  2010; 2009    "for tumors on surface of bladder"  . Knee arthroscopy  2005    right; S/P fall  . Cataract extraction w/ intraocular lens  implant, bilateral Bilateral 1992  . Back surgery  1976 UPPER BACK    tumor removed-benign  . Tonsillectomy  1952  . Breast cyst excision  YRS AGO    2 cysts removed-left breast-benign  . Cardiac catheterization  1980's  . Abdominal hysterectomy  1960    partial hysterectomy  . Left thumb  benign tumor removed  1980'S  . Total knee arthroplasty Right 02/05/2013    Procedure: TOTAL KNEE ARTHROPLASTY;  Surgeon: Shelda Pal, MD;  Location: [facility];  Service: Orthopedics;  Laterality: Right;    reports that she quit smoking about 30 years ago. Her smoking use included Cigarettes. She has a 50 pack-year smoking history. She has never used smokeless tobacco. She reports that she drinks about 4.2 ounces of alcohol per week. She reports that she does not use illicit drugs. family history includes Anesthesia problems in her daughter; Breast cancer in her other; Heart disease in her brother; and Prostate cancer in  her brother.  There is no history of Colon cancer. Allergies  Allergen Reactions  . Contrast Media (Iodinated Diagnostic Agents) Hives  . Penicillins Rash    "haven't had it in years"        Review of Systems: Denies rectal bleeding fever abdominal pain  The remainder of the 10 point ROS is negative except as outlined in H&P   Physical Exam: General appearance  Well developed, in no distress. Eyes- non icteric. HEENT nontraumatic, normocephalic. Mouth no lesions, tongue papillated, no cheilosis. Neck supple without adenopathy, thyroid not enlarged, no carotid bruits, no JVD. Lungs Clear to auscultation bilaterally. Cor normal S1, normal S2, regular rhythm, no murmur,  quiet  precordium. Abdomen: Soft with normoactive bowel sounds. Minimal tenderness left lower quadrant with induration and palpable sigmoid colon. No bruit Rectal: Not done Extremities no pedal edema. Skin no lesions. Neurological alert and oriented x 3. Psychological normal mood and affect.  Assessment and Plan:  79 year old white female with multiple comorbidities who has a colo- enteric fistula on CT scan 2 months ago and is clinically improved after course of Cipro and Flagyl.. She's a high surgical risk and we will hold off on the surgical consultation.. Will continue  stool softener and high fiber diet. I would be interested in at least flexible sigmoidoscopy or colonoscopy when she goes off Coumadin October 2014. Her brother had colon cancer and the patient is a concern about possibility of cancer, although the CT scan suggests benign process.   07/13/2013 [name]

## 2013-07-16 DIAGNOSIS — Z7901 Long term (current) use of anticoagulants: Secondary | ICD-10-CM | POA: Diagnosis not present

## 2013-07-16 DIAGNOSIS — I4891 Unspecified atrial fibrillation: Secondary | ICD-10-CM | POA: Diagnosis not present

## 2013-07-20 ENCOUNTER — Encounter: Payer: Self-pay | Admitting: Cardiology

## 2013-07-20 DIAGNOSIS — I4891 Unspecified atrial fibrillation: Secondary | ICD-10-CM | POA: Insufficient documentation

## 2013-07-20 DIAGNOSIS — I482 Chronic atrial fibrillation, unspecified: Secondary | ICD-10-CM | POA: Insufficient documentation

## 2013-07-20 DIAGNOSIS — R600 Localized edema: Secondary | ICD-10-CM | POA: Insufficient documentation

## 2013-07-20 DIAGNOSIS — I4821 Permanent atrial fibrillation: Secondary | ICD-10-CM | POA: Insufficient documentation

## 2013-07-20 NOTE — Assessment & Plan Note (Signed)
Okay to hold warfarin [name], [place].D. to give hold and restart parameters) as needed for biopsies.

## 2013-07-20 NOTE — Assessment & Plan Note (Signed)
Her edema is well worse today.  Will have her bump up her Lasix dose for next couple days and then go back to her standing dose.  I recommended that she monitor daily weights to take an additional half a dose of Lasix for weight gain greater than 3 pounds.

## 2013-07-20 NOTE — Assessment & Plan Note (Signed)
Chronic DVTs are managed with atrial fibrillation related anticoagulation.

## 2013-07-20 NOTE — Progress Notes (Signed)
Patient ID: [name], female   DOB: 12-Feb-1921, 79 y.o.   MRN: [id number] PCP: Pearson Grippe, MD  Clinic Note: Chief Complaint  Patient presents with  . Follow-up    have some SOB yeaterday, swelling in right leg, had R Knee replacement in February   HPI: FLORNCE Chambers is a 79 y.o. female with a PMH below who presents today for six-month followup of her chronic atrial fibrillation. I saw her in January of this year as part of pre-of evaluation for her right knee arthroplasty that was done in February.  She also has a history of chronic DVT with chronic edema associated with it.  Long ago she had an ischemic evaluation that was negative for coronary disease.  She's not had any anginal symptoms since.  She may have some mild diastolic arthritis and is due to her chronic AFib on occasion.  Apparently she had heart catheterization back in the 1980s which showed no significant disease.  Interval History:  Today she comes in for routine followup, had been doing relatively well until yesterday.  She felt more short of breath than usual, saying that on several occasions he felt it she has had shallow breathing the breathing she gave out on her.  She's never had this symptom before, and has not had any today.  She does get short of breath and sheet is a little more active than usual. She is recovering relatively well for her knee surgery.  She still has swelling in her right foot and ankle that is actually more associated with the ankle in the leg.  She's been doing her rehabilitation has been getting along pretty well. Otherwise, the remainder of cardiac review of systems is as follows: Cardiovascular ROS: negative for - chest pain, loss of consciousness, murmur, orthopnea, paroxysmal nocturnal dyspnea, rapid heart rate or And is usually the case she only occasionally notes palpitations from her atrial fibrillation, but denies lightheadedness, weakness or syncope/near syncope.  No TIA or  amaurosis fugax symptoms.  No melena, hematochezia or hematuria.  She does have a history of microscopic hematuria but none that she can tell.  No nosebleeds   She says she is due for her bladder biopsy and October.  It should be fine for stopping her Coumadin for that.  Past Medical History  Diagnosis Date  . Coronary artery disease     Nonobstructive  . GERD (gastroesophageal reflux disease)   . Headache(784.0)   . High cholesterol   . DVT (deep venous thrombosis), right 2005    "dry blood; after 8 foot fall"  . Chronic bronchitis     "get it basically 3 times/yr"  . Blood transfusion 1960  . H/O hiatal hernia   . Rheumatoid arthritis(714.0)   . Concussion w/o coma 03/31/2004    Complicated by subarachnoid hemorrhage.  "even now has times when she's not able to comprehend" (05/08/12)  . Chronic atrial fibrillation     Anticoagulated with warfarin, rate control with diltiazem and Toprol  . Pneumonia ~ 2010 AND 2013  . 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 Diverticulitis   . Diverticulosis   . External hemorrhoids   . Fatty liver   . AAA (abdominal aortic aneurysm)   . Edema of both legs     Chronic, thought to be secondary to DVTs    Prior Cardiac Evaluation and Past Surgical History: Past Surgical History  Procedure Laterality Date  . Transurethral  resection of bladder tumor  11/11/2011    Procedure: TRANSURETHRAL RESECTION OF BLADDER TUMOR (TURBT);  Surgeon: Anner Crete;  Location: [facility];  Service: Urology;  Laterality: N/A;  Cysto, Bladder Biopsy, TURBT with Gyrus,   . Cystoscopy  11/11/2011    Procedure: CYSTOSCOPY;  Surgeon: Anner Crete;  Location: [facility];  Service: Urology;  Laterality: N/A;  . Bladder lift  YRS AGO  . Bladder cancer  2010; 2009    "for tumors on surface of bladder"  . Knee arthroscopy  2005    right; S/P fall  . Cataract extraction w/ intraocular lens  implant, bilateral Bilateral 1992  . Back  surgery  1976 UPPER BACK    tumor removed-benign  . Tonsillectomy  1952  . Breast cyst excision  YRS AGO    2 cysts removed-left breast-benign  . Cardiac catheterization  1980's  . Abdominal hysterectomy  1960    partial hysterectomy  . Left thumb  benign tumor removed  1980'S  . Total knee arthroplasty Right 02/05/2013    Procedure: TOTAL KNEE ARTHROPLASTY;  Surgeon: Shelda Pal, MD;  Location: [facility];  Service: Orthopedics;  Laterality: Right;   Allergies  Allergen Reactions  . Contrast Media (Iodinated Diagnostic Agents) Hives  . Penicillins Rash    "haven't had it in years"    Current Outpatient Prescriptions  Medication Sig Dispense Refill  . acetaminophen (TYLENOL) 500 MG tablet Take 1,000 mg by mouth every 6 (six) hours as needed for pain.      . calcium carbonate (OS-CAL - DOSED IN MG OF ELEMENTAL CALCIUM) 1250 MG tablet Take 1 tablet by mouth daily.      . cholecalciferol (VITAMIN D) 1000 UNITS tablet Take 1,000 Units by mouth daily.      . Cinnamon 500 MG capsule Take 1,000 mg by mouth daily.      Marland Kitchen diltiazem (TIAZAC) 240 MG 24 hr capsule Take 240 mg by mouth every morning.      . fish oil-omega-3 fatty acids 1000 MG capsule Take 1 g by mouth 2 (two) times daily.       . furosemide (LASIX) 40 MG tablet TAKE ONE TABLET BY MOUTH ONE TIME DAILY  30 tablet  0  . guaiFENesin (MUCINEX) 600 MG 12 hr tablet Take 600 mg by mouth 2 (two) times daily as needed for congestion.      . metoprolol (TOPROL-XL) 50 MG 24 hr tablet Take 50 mg by mouth at bedtime.       . Multiple Vitamins-Minerals (MULTIVITAMINS THER. W/MINERALS) TABS Take 1 tablet by mouth daily.       Marland Kitchen omeprazole (PRILOSEC) 20 MG capsule Take 20 mg by mouth 2 (two) times daily.      . rosuvastatin (CRESTOR) 5 MG tablet Take 5 mg by mouth at bedtime.       Marland Kitchen spironolactone (ALDACTONE) 25 MG tablet Take 25 mg by mouth daily.      Marland Kitchen warfarin (COUMADIN) 5 MG tablet Take 5 mg by mouth daily.      . psyllium (METAMUCIL) 58.6  % powder Take 1 packet by mouth daily.       No current facility-administered medications for this visit.    History   Social History Narrative   She is a widowed mother of 3, grandmother of 5, great grandmother of 5. She does not   really get routine exercise. She quit smoking in 1983 -- after smoking a pack a day for 50 years.  She does not smoke and does not use illicit drugs and does not drink.   ROS: A comprehensive Review of Systems - Negative except Pertinent positives noted above. Respiratory ROS: positive for - Intermittently feeling short of breath, but not associated with anything in particular negative for - hemoptysis, orthopnea, pleuritic pain, sputum changes, stridor, tachypnea or wheezing Musculoskeletal ROS: positive for - Swelling and discomfort in the right knee with more notable edema in that leg.  She does have bilateral edema.  PHYSICAL EXAM BP 126/62  Ht 5\' 5"  (1.651 m)  Wt 158 lb 9.6 oz (71.94 kg)  BMI 26.39 kg/m2 General appearance: Very pleasant, somewhat confused elderly woman.  Very hard of hearing, in no acute distress.  A and O. x3 and does answer questions appropriately but is somewhat tangential with her thought process.  HEENT: [place]/AT, EOMI, MMM, anicteric sclera, mild arcus senilis Neck: no adenopathy, no carotid bruit, no JVD and supple, symmetrical, trachea midline Lungs: clear to auscultation bilaterally, normal percussion bilaterally and increased AP diameter, and mild interstitial sounds but normal effort. Heart: irregularly irregular rhythm, S1, S2 normal, no S3 or S4 and no murmurs or rubs.  Nondisplaced PMI.   Abdomen: soft, non-tender; bowel sounds normal; no masses,  no organomegaly Extremities: edema 3+ with right lower charity, 2+ left, no ulcers, gangrene or trophic changes and has mild spider veins but no major/large varicose veins. Pulses: 2+ and symmetric bilateral radial, but due to the extent of edema, but not really able to palpate pulses  on the dorsalis pedis and posterior tibial pulses.  [id number] today: Yes Rate: 64 , Rhythm: Atrial fibrillation with PVCs/or barely conducted topics his.  Nonspecific ST-T changes with mild flattening ST segments and T-wave inversions in V5 and 6; no significant change from prior ECG  Recent Labs: Labs from June 30 reviewed on Epic  ASSESSMENT / PLAN: Overall relatively stable.  Not sure to make of this refill episodes of shortness of breath she had yesterday.  She is otherwise relatively stable.  Chronic anticoagulation Rate is well-controlled with beta blocker and calcium channel blocker combination.  No signs of tachybradycardia syndrome.  She is only mildly so that.  I think things will be made a lot usually when she went to once daily doses of diltiazem and metoprolol at alternating times a day.  Again we told her family that she has rapid episodes, she should take an additional dose of either one of the medications as a when necessary.  No change.  Warfarin followed by [name], Pharm.D.  Hypertension Stable blood pressure.  No changes in medications.  Primary bladder malignant neoplasm Okay to hold warfarin [name], [place].D. to give hold and restart parameters) as needed for biopsies.  DVT (deep venous thrombosis), right Chronic DVTs are managed with atrial fibrillation related anticoagulation.  Dyslipidemia On low-dose statin.  Also followed by PCP.  Edema of both legs Her edema is well worse today.  Will have her bump up her Lasix dose for next couple days and then go back to her standing dose.  I recommended that she monitor daily weights to take an additional half a dose of Lasix for weight gain greater than 3 pounds.   Followup: 6 months   W. Herbie Baltimore, M.D., M.S. [facility] [address] [place], [place]  [address]  [phone] Pager # [phone]

## 2013-07-20 NOTE — Assessment & Plan Note (Signed)
Rate is well-controlled with beta blocker and calcium channel blocker combination.  No signs of tachybradycardia syndrome.  She is only mildly so that.  I think things will be made a lot usually when she went to once daily doses of diltiazem and metoprolol at alternating times a day.  Again we told her family that she has rapid episodes, she should take an additional dose of either one of the medications as a when necessary.  No change.  Warfarin followed by [name], Pharm.D.

## 2013-07-20 NOTE — Assessment & Plan Note (Signed)
On low-dose statin.  Also followed by PCP.

## 2013-07-20 NOTE — Assessment & Plan Note (Signed)
Stable blood pressure.  No changes in medications.

## 2013-07-23 DIAGNOSIS — R609 Edema, unspecified: Secondary | ICD-10-CM | POA: Diagnosis not present

## 2013-07-30 ENCOUNTER — Encounter: Payer: Self-pay | Admitting: Internal Medicine

## 2013-07-30 DIAGNOSIS — Z96659 Presence of unspecified artificial knee joint: Secondary | ICD-10-CM | POA: Diagnosis not present

## 2013-08-06 ENCOUNTER — Ambulatory Visit: Payer: Medicare Other | Admitting: Physical Therapy

## 2013-08-08 DIAGNOSIS — I10 Essential (primary) hypertension: Secondary | ICD-10-CM | POA: Diagnosis not present

## 2013-08-08 DIAGNOSIS — Z7901 Long term (current) use of anticoagulants: Secondary | ICD-10-CM | POA: Diagnosis not present

## 2013-08-08 DIAGNOSIS — I4891 Unspecified atrial fibrillation: Secondary | ICD-10-CM | POA: Diagnosis not present

## 2013-08-09 ENCOUNTER — Ambulatory Visit: Payer: Medicare Other | Attending: Orthopedic Surgery | Admitting: Physical Therapy

## 2013-08-09 DIAGNOSIS — M25569 Pain in unspecified knee: Secondary | ICD-10-CM | POA: Insufficient documentation

## 2013-08-09 DIAGNOSIS — IMO0001 Reserved for inherently not codable concepts without codable children: Secondary | ICD-10-CM | POA: Diagnosis not present

## 2013-08-09 DIAGNOSIS — R5381 Other malaise: Secondary | ICD-10-CM | POA: Diagnosis not present

## 2013-08-09 DIAGNOSIS — M25669 Stiffness of unspecified knee, not elsewhere classified: Secondary | ICD-10-CM | POA: Insufficient documentation

## 2013-08-09 DIAGNOSIS — Z96659 Presence of unspecified artificial knee joint: Secondary | ICD-10-CM | POA: Diagnosis not present

## 2013-08-14 ENCOUNTER — Ambulatory Visit: Payer: Medicare Other | Attending: Orthopedic Surgery | Admitting: Physical Therapy

## 2013-08-14 DIAGNOSIS — R5381 Other malaise: Secondary | ICD-10-CM | POA: Insufficient documentation

## 2013-08-14 DIAGNOSIS — M25569 Pain in unspecified knee: Secondary | ICD-10-CM | POA: Insufficient documentation

## 2013-08-14 DIAGNOSIS — IMO0001 Reserved for inherently not codable concepts without codable children: Secondary | ICD-10-CM | POA: Insufficient documentation

## 2013-08-14 DIAGNOSIS — Z96659 Presence of unspecified artificial knee joint: Secondary | ICD-10-CM | POA: Insufficient documentation

## 2013-08-14 DIAGNOSIS — M25669 Stiffness of unspecified knee, not elsewhere classified: Secondary | ICD-10-CM | POA: Diagnosis not present

## 2013-08-15 ENCOUNTER — Ambulatory Visit: Payer: Medicare Other

## 2013-08-17 DIAGNOSIS — R7309 Other abnormal glucose: Secondary | ICD-10-CM | POA: Diagnosis not present

## 2013-08-17 DIAGNOSIS — I10 Essential (primary) hypertension: Secondary | ICD-10-CM | POA: Diagnosis not present

## 2013-08-20 ENCOUNTER — Ambulatory Visit: Payer: Medicare Other

## 2013-08-22 ENCOUNTER — Ambulatory Visit: Payer: Medicare Other

## 2013-08-23 DIAGNOSIS — I10 Essential (primary) hypertension: Secondary | ICD-10-CM | POA: Diagnosis not present

## 2013-08-23 DIAGNOSIS — R7309 Other abnormal glucose: Secondary | ICD-10-CM | POA: Diagnosis not present

## 2013-08-23 DIAGNOSIS — R609 Edema, unspecified: Secondary | ICD-10-CM | POA: Diagnosis not present

## 2013-08-23 DIAGNOSIS — J40 Bronchitis, not specified as acute or chronic: Secondary | ICD-10-CM | POA: Diagnosis not present

## 2013-08-24 ENCOUNTER — Ambulatory Visit: Payer: Medicare Other

## 2013-08-27 ENCOUNTER — Ambulatory Visit: Payer: Medicare Other

## 2013-08-29 ENCOUNTER — Ambulatory Visit: Payer: Medicare Other

## 2013-08-31 ENCOUNTER — Ambulatory Visit: Payer: Medicare Other | Admitting: Physical Therapy

## 2013-09-03 ENCOUNTER — Ambulatory Visit: Payer: Medicare Other | Admitting: Physical Therapy

## 2013-09-05 ENCOUNTER — Ambulatory Visit: Payer: Medicare Other | Admitting: Physical Therapy

## 2013-09-05 ENCOUNTER — Encounter: Payer: Medicare Other | Admitting: Physical Therapy

## 2013-09-06 DIAGNOSIS — I4891 Unspecified atrial fibrillation: Secondary | ICD-10-CM | POA: Diagnosis not present

## 2013-09-06 DIAGNOSIS — E7800 Pure hypercholesterolemia, unspecified: Secondary | ICD-10-CM | POA: Diagnosis not present

## 2013-09-06 DIAGNOSIS — I10 Essential (primary) hypertension: Secondary | ICD-10-CM | POA: Diagnosis not present

## 2013-09-06 DIAGNOSIS — IMO0001 Reserved for inherently not codable concepts without codable children: Secondary | ICD-10-CM | POA: Diagnosis not present

## 2013-09-06 DIAGNOSIS — Z006 Encounter for examination for normal comparison and control in clinical research program: Secondary | ICD-10-CM | POA: Diagnosis not present

## 2013-09-06 DIAGNOSIS — Z7901 Long term (current) use of anticoagulants: Secondary | ICD-10-CM | POA: Diagnosis not present

## 2013-09-07 ENCOUNTER — Ambulatory Visit: Payer: Medicare Other | Admitting: *Deleted

## 2013-09-10 ENCOUNTER — Ambulatory Visit: Payer: Medicare Other

## 2013-09-12 ENCOUNTER — Ambulatory Visit: Payer: Medicare Other | Attending: Orthopedic Surgery

## 2013-09-12 DIAGNOSIS — Z96659 Presence of unspecified artificial knee joint: Secondary | ICD-10-CM | POA: Diagnosis not present

## 2013-09-12 DIAGNOSIS — M25569 Pain in unspecified knee: Secondary | ICD-10-CM | POA: Diagnosis not present

## 2013-09-12 DIAGNOSIS — M25669 Stiffness of unspecified knee, not elsewhere classified: Secondary | ICD-10-CM | POA: Diagnosis not present

## 2013-09-12 DIAGNOSIS — R5381 Other malaise: Secondary | ICD-10-CM | POA: Diagnosis not present

## 2013-09-12 DIAGNOSIS — IMO0001 Reserved for inherently not codable concepts without codable children: Secondary | ICD-10-CM | POA: Diagnosis not present

## 2013-09-20 DIAGNOSIS — Z7901 Long term (current) use of anticoagulants: Secondary | ICD-10-CM | POA: Diagnosis not present

## 2013-09-20 DIAGNOSIS — I4891 Unspecified atrial fibrillation: Secondary | ICD-10-CM | POA: Diagnosis not present

## 2013-09-20 DIAGNOSIS — I10 Essential (primary) hypertension: Secondary | ICD-10-CM | POA: Diagnosis not present

## 2013-09-21 DIAGNOSIS — H43819 Vitreous degeneration, unspecified eye: Secondary | ICD-10-CM | POA: Diagnosis not present

## 2013-10-08 DIAGNOSIS — D494 Neoplasm of unspecified behavior of bladder: Secondary | ICD-10-CM | POA: Diagnosis not present

## 2013-10-08 DIAGNOSIS — N3941 Urge incontinence: Secondary | ICD-10-CM | POA: Diagnosis not present

## 2013-10-08 HISTORY — PX: CYSTOSTOMY W/ BLADDER BIOPSY: SHX1431

## 2013-10-09 DIAGNOSIS — Z01818 Encounter for other preprocedural examination: Secondary | ICD-10-CM | POA: Diagnosis not present

## 2013-10-11 DIAGNOSIS — Z7901 Long term (current) use of anticoagulants: Secondary | ICD-10-CM | POA: Diagnosis not present

## 2013-10-11 DIAGNOSIS — I4891 Unspecified atrial fibrillation: Secondary | ICD-10-CM | POA: Diagnosis not present

## 2013-10-15 DIAGNOSIS — Z23 Encounter for immunization: Secondary | ICD-10-CM | POA: Diagnosis not present

## 2013-10-15 DIAGNOSIS — I4891 Unspecified atrial fibrillation: Secondary | ICD-10-CM | POA: Diagnosis not present

## 2013-10-15 DIAGNOSIS — Z7901 Long term (current) use of anticoagulants: Secondary | ICD-10-CM | POA: Diagnosis not present

## 2013-10-16 ENCOUNTER — Ambulatory Visit (INDEPENDENT_AMBULATORY_CARE_PROVIDER_SITE_OTHER): Payer: Medicare Other | Admitting: Internal Medicine

## 2013-10-16 ENCOUNTER — Encounter: Payer: Self-pay | Admitting: Internal Medicine

## 2013-10-16 VITALS — BP 106/50 | HR 64 | Ht 65.0 in | Wt 160.5 lb

## 2013-10-16 DIAGNOSIS — K632 Fistula of intestine: Secondary | ICD-10-CM | POA: Diagnosis not present

## 2013-10-16 DIAGNOSIS — R933 Abnormal findings on diagnostic imaging of other parts of digestive tract: Secondary | ICD-10-CM | POA: Diagnosis not present

## 2013-10-16 DIAGNOSIS — K5732 Diverticulitis of large intestine without perforation or abscess without bleeding: Secondary | ICD-10-CM | POA: Diagnosis not present

## 2013-10-16 NOTE — Progress Notes (Signed)
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 A CT scan of the abdomen and pelvis at that time showed linear density extending between the proximal sigmoid colon and adjacent small bowel in the anterior left lower quadrant. There was no abscess. She had a stable infrarenal aneurysm 3.2 cm in diameter. She has known severe diverticulosis of the left colon confirmed on a colonoscopy in December 2008. She also had a hyperplastic polyp. A colonoscopy in 2000 showed a tubular adenoma. In 2003, there were no polyps. She has been followed for bladder cancer by Dr. Annabell Howells. She has a history of atrial fibrillation for which she took Coumadin. She had total knee replacement in February 2014. She has a history of Candida esophagitis on endoscopy in December 2008.   Past Medical History  Diagnosis Date  . Coronary artery disease     Nonobstructive  . GERD (gastroesophageal reflux disease)   . Headache(784.0)   . High cholesterol   . DVT (deep venous thrombosis), right 2005    "dry blood; after 8 foot fall"  . Chronic bronchitis     "get it basically 3 times/yr"  . Blood transfusion 1960  . H/O hiatal hernia   . Rheumatoid arthritis(714.0)   . Concussion w/o coma 03/31/2004    Complicated by subarachnoid hemorrhage.  "even now has times when she's not able to comprehend" (05/08/12)  . Chronic atrial fibrillation     Anticoagulated with warfarin, rate control with diltiazem and Toprol  . Pneumonia ~ 2010 AND 2013  . 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 Diverticulitis   . Diverticulosis   . External hemorrhoids   . Fatty liver   . AAA (abdominal aortic aneurysm)   . Edema of both legs     Chronic, thought to be secondary to DVTs   Past Surgical History  Procedure Laterality Date  .  Transurethral resection of bladder tumor  11/11/2011    Procedure: TRANSURETHRAL RESECTION OF BLADDER TUMOR (TURBT);  Surgeon: Anner Crete;  Location: [facility];  Service: Urology;  Laterality: N/A;  Cysto, Bladder Biopsy, TURBT with Gyrus,   . Cystoscopy  11/11/2011    Procedure: CYSTOSCOPY;  Surgeon: Anner Crete;  Location: [facility];  Service: Urology;  Laterality: N/A;  . Bladder lift  YRS AGO  . Bladder cancer  2010; 2009    "for tumors on surface of bladder"  . Knee arthroscopy  2005    right; S/P fall  . Cataract extraction w/ intraocular lens  implant, bilateral Bilateral 1992  . Back surgery  1976 UPPER BACK    tumor removed-benign  . Tonsillectomy  1952  . Breast cyst excision  YRS AGO    2 cysts removed-left breast-benign  . Cardiac catheterization  1980's  . Abdominal hysterectomy  1960    partial hysterectomy  . Left thumb  benign tumor removed  1980'S  . Total knee arthroplasty Right 02/05/2013    Procedure: TOTAL KNEE ARTHROPLASTY;  Surgeon: Shelda Pal, MD;  Location: [facility];  Service: Orthopedics;  Laterality: Right;  . Cystostomy w/ bladder biopsy  10/08/2013    reports that she quit smoking about 30 years ago. Her smoking use included Cigarettes. She has a 50 pack-year smoking history. She has never used smokeless tobacco. She reports that she drinks about  4.2 ounces of alcohol per week. She reports that she does not use illicit drugs. family history includes Anesthesia problems in her daughter; Breast cancer in her other; Heart disease in her brother; Prostate cancer in her brother. There is no history of Colon cancer. Allergies  Allergen Reactions  . Contrast Media [Iodinated Diagnostic Agents] Hives  . Penicillins Rash    "haven't had it in years"        Review of Systems: Positive for constipation negative for rectal bleeding  The remainder of the 10 point ROS is negative except as outlined in H&P   Physical Exam: General appearance  Well developed, in no  distress. Appearing younger than her stated age Eyes- non icteric. HEENT nontraumatic, normocephalic. Mouth no lesions, tongue papillated, no cheilosis. Neck supple without adenopathy, thyroid not enlarged, no carotid bruits, no JVD. Lungs Clear to auscultation bilaterally. Cor normal S1, normal S2, regular rhythm, no murmur,  quiet precordium. Abdomen: Soft abdomen with normal active bowel sounds. Well-healed surgical scar. Marked tenderness in left lower quadrant without rebound or fullness. Rectal: Not done. Extremities no pedal edema. Skin no lesions. Neurological alert and oriented x 3. Psychological normal mood and affect.  Assessment and Plan  Problem #13 79 year old white female with colo-enteric fistula, persistent tenderness and left lower quadrant without signs of fever or ongoing infection. We will repeat a CT scan of the abdomen with attention to the left lower quadrant to update [place] on the fistula. She will continue Metamucil 1 heaping teaspoon daily and increase her fluid intake to mostly water. She may also take stool softeners, 1-2 a day to improve her bowel habits. She is not a good surgical candidate because of her advanced age. I would not recommend a surgical resection of the fistula unless she has progressive symptoms indicating abscess or bleeding.   10/16/2013 Lina Sar

## 2013-10-16 NOTE — Patient Instructions (Signed)
Please purchase Metamucil over the counter. Take 1-2 heaping teaspoon daily.  Please purchase a stool softener and take 1-2 of them daily.  You have been scheduled for a CT scan of the abdomen and pelvis at [facility] ([address]---this is in the same building as [facility]).   You are scheduled on Friday 10/19/13 at 1:30 pm. You should arrive 15 minutes prior to your appointment time for registration. Please follow the written instructions below on the day of your exam:  1) Do not eat or drink anything after 9:30 am (4 hours prior to your test) 2) You have been given 2 bottles of oral contrast to drink. The solution may tastebetter if refrigerated, but do NOT add ice or any other liquid to this solution. Shake well before drinking.    Drink 1 bottle of contrast @ 11:30 am (2 hours prior to your exam)  Drink 1 bottle of contrast @ 12:30 pm (1 hour prior to your exam)  You may take any medications as prescribed with a small amount of water except for the following: Metformin, Glucophage, Glucovance, Avandamet, Riomet, Fortamet, Actoplus Met, Janumet, Glumetza or Metaglip. The above medications must be held the day of the exam AND 48 hours after the exam.  The purpose of you drinking the oral contrast is to aid in the visualization of your intestinal tract. The contrast solution may cause some diarrhea. Before your exam is started, you will be given a small amount of fluid to drink.   This test typically takes 30-45 minutes to complete.  If you have any questions regarding your exam or if you need to reschedule, you may call the CT department at [phone] between the hours of 8:00 am and 5:00 pm, Monday-Friday.  ________________________________________________________________________ Cc: Dr Pearson Grippe

## 2013-10-19 ENCOUNTER — Ambulatory Visit (INDEPENDENT_AMBULATORY_CARE_PROVIDER_SITE_OTHER)
Admission: RE | Admit: 2013-10-19 | Discharge: 2013-10-19 | Disposition: A | Discharge status: Home / Self Care | Payer: Medicare Other | Source: Ambulatory Visit | Attending: Internal Medicine | Admitting: Internal Medicine

## 2013-10-19 DIAGNOSIS — K632 Fistula of intestine: Secondary | ICD-10-CM

## 2013-10-25 DIAGNOSIS — I4891 Unspecified atrial fibrillation: Secondary | ICD-10-CM | POA: Diagnosis not present

## 2013-10-25 DIAGNOSIS — Z7901 Long term (current) use of anticoagulants: Secondary | ICD-10-CM | POA: Diagnosis not present

## 2013-11-07 ENCOUNTER — Emergency Department (HOSPITAL_COMMUNITY): Payer: Medicare Other

## 2013-11-07 ENCOUNTER — Inpatient Hospital Stay (HOSPITAL_COMMUNITY): Payer: Medicare Other

## 2013-11-07 ENCOUNTER — Encounter (HOSPITAL_COMMUNITY): Payer: Self-pay | Admitting: Emergency Medicine

## 2013-11-07 ENCOUNTER — Inpatient Hospital Stay (HOSPITAL_COMMUNITY)
Admission: EM | Admit: 2013-11-07 | Discharge: 2013-11-10 | DRG: 872 | Disposition: A | Discharge status: Home / Self Care | Payer: Medicare Other | Attending: Internal Medicine | Admitting: Internal Medicine

## 2013-11-07 DIAGNOSIS — Z96659 Presence of unspecified artificial knee joint: Secondary | ICD-10-CM | POA: Diagnosis not present

## 2013-11-07 DIAGNOSIS — J189 Pneumonia, unspecified organism: Secondary | ICD-10-CM

## 2013-11-07 DIAGNOSIS — E872 Acidosis, unspecified: Secondary | ICD-10-CM | POA: Diagnosis not present

## 2013-11-07 DIAGNOSIS — Z86718 Personal history of other venous thrombosis and embolism: Secondary | ICD-10-CM

## 2013-11-07 DIAGNOSIS — I714 Abdominal aortic aneurysm, without rupture, unspecified: Secondary | ICD-10-CM | POA: Diagnosis present

## 2013-11-07 DIAGNOSIS — J40 Bronchitis, not specified as acute or chronic: Secondary | ICD-10-CM | POA: Diagnosis not present

## 2013-11-07 DIAGNOSIS — I2510 Atherosclerotic heart disease of native coronary artery without angina pectoris: Secondary | ICD-10-CM | POA: Diagnosis present

## 2013-11-07 DIAGNOSIS — E876 Hypokalemia: Secondary | ICD-10-CM

## 2013-11-07 DIAGNOSIS — K2940 Chronic atrophic gastritis without bleeding: Secondary | ICD-10-CM | POA: Diagnosis present

## 2013-11-07 DIAGNOSIS — Z8551 Personal history of malignant neoplasm of bladder: Secondary | ICD-10-CM | POA: Diagnosis not present

## 2013-11-07 DIAGNOSIS — R509 Fever, unspecified: Secondary | ICD-10-CM

## 2013-11-07 DIAGNOSIS — Z7901 Long term (current) use of anticoagulants: Secondary | ICD-10-CM | POA: Diagnosis not present

## 2013-11-07 DIAGNOSIS — A084 Viral intestinal infection, unspecified: Secondary | ICD-10-CM | POA: Diagnosis present

## 2013-11-07 DIAGNOSIS — I482 Chronic atrial fibrillation, unspecified: Secondary | ICD-10-CM

## 2013-11-07 DIAGNOSIS — B3781 Candidal esophagitis: Secondary | ICD-10-CM

## 2013-11-07 DIAGNOSIS — K219 Gastro-esophageal reflux disease without esophagitis: Secondary | ICD-10-CM

## 2013-11-07 DIAGNOSIS — I4891 Unspecified atrial fibrillation: Secondary | ICD-10-CM | POA: Diagnosis present

## 2013-11-07 DIAGNOSIS — K5730 Diverticulosis of large intestine without perforation or abscess without bleeding: Secondary | ICD-10-CM

## 2013-11-07 DIAGNOSIS — I739 Peripheral vascular disease, unspecified: Secondary | ICD-10-CM

## 2013-11-07 DIAGNOSIS — C679 Malignant neoplasm of bladder, unspecified: Secondary | ICD-10-CM

## 2013-11-07 DIAGNOSIS — I4821 Permanent atrial fibrillation: Secondary | ICD-10-CM | POA: Diagnosis present

## 2013-11-07 DIAGNOSIS — K7689 Other specified diseases of liver: Secondary | ICD-10-CM | POA: Diagnosis present

## 2013-11-07 DIAGNOSIS — E7800 Pure hypercholesterolemia, unspecified: Secondary | ICD-10-CM | POA: Diagnosis present

## 2013-11-07 DIAGNOSIS — D126 Benign neoplasm of colon, unspecified: Secondary | ICD-10-CM

## 2013-11-07 DIAGNOSIS — A419 Sepsis, unspecified organism: Secondary | ICD-10-CM | POA: Diagnosis not present

## 2013-11-07 DIAGNOSIS — Z87891 Personal history of nicotine dependence: Secondary | ICD-10-CM | POA: Diagnosis not present

## 2013-11-07 DIAGNOSIS — E785 Hyperlipidemia, unspecified: Secondary | ICD-10-CM | POA: Diagnosis present

## 2013-11-07 DIAGNOSIS — K632 Fistula of intestine: Secondary | ICD-10-CM

## 2013-11-07 DIAGNOSIS — R609 Edema, unspecified: Secondary | ICD-10-CM | POA: Diagnosis not present

## 2013-11-07 DIAGNOSIS — K644 Residual hemorrhoidal skin tags: Secondary | ICD-10-CM

## 2013-11-07 DIAGNOSIS — I82401 Acute embolism and thrombosis of unspecified deep veins of right lower extremity: Secondary | ICD-10-CM | POA: Diagnosis present

## 2013-11-07 DIAGNOSIS — L02419 Cutaneous abscess of limb, unspecified: Secondary | ICD-10-CM | POA: Diagnosis not present

## 2013-11-07 DIAGNOSIS — M069 Rheumatoid arthritis, unspecified: Secondary | ICD-10-CM | POA: Diagnosis present

## 2013-11-07 DIAGNOSIS — R7989 Other specified abnormal findings of blood chemistry: Secondary | ICD-10-CM

## 2013-11-07 DIAGNOSIS — L03119 Cellulitis of unspecified part of limb: Secondary | ICD-10-CM | POA: Diagnosis present

## 2013-11-07 DIAGNOSIS — N281 Cyst of kidney, acquired: Secondary | ICD-10-CM | POA: Diagnosis not present

## 2013-11-07 DIAGNOSIS — K449 Diaphragmatic hernia without obstruction or gangrene: Secondary | ICD-10-CM

## 2013-11-07 DIAGNOSIS — I10 Essential (primary) hypertension: Secondary | ICD-10-CM

## 2013-11-07 DIAGNOSIS — R600 Localized edema: Secondary | ICD-10-CM

## 2013-11-07 DIAGNOSIS — R112 Nausea with vomiting, unspecified: Secondary | ICD-10-CM | POA: Diagnosis not present

## 2013-11-07 DIAGNOSIS — R5381 Other malaise: Secondary | ICD-10-CM | POA: Diagnosis not present

## 2013-11-07 DIAGNOSIS — R6889 Other general symptoms and signs: Secondary | ICD-10-CM | POA: Diagnosis not present

## 2013-11-07 DIAGNOSIS — L03115 Cellulitis of right lower limb: Secondary | ICD-10-CM | POA: Diagnosis present

## 2013-11-07 DIAGNOSIS — A088 Other specified intestinal infections: Secondary | ICD-10-CM | POA: Diagnosis present

## 2013-11-07 LAB — COMPREHENSIVE METABOLIC PANEL
ALT: 16 U/L (ref 0–35)
AST: 22 U/L (ref 0–37)
Albumin: 3.8 g/dL (ref 3.5–5.2)
Alkaline Phosphatase: 84 U/L (ref 39–117)
CO2: 26 mEq/L (ref 19–32)
Calcium: 8.6 mg/dL (ref 8.4–10.5)
Chloride: 97 mEq/L (ref 96–112)
Creatinine, Ser: 0.59 mg/dL (ref 0.50–1.10)
GFR calc non Af Amer: 78 mL/min — ABNORMAL LOW (ref >90)
Potassium: 3.2 mEq/L — ABNORMAL LOW (ref 3.5–5.1)
Sodium: 136 mEq/L (ref 135–145)
Total Bilirubin: 0.5 mg/dL (ref 0.3–1.2)

## 2013-11-07 LAB — URINALYSIS, ROUTINE W REFLEX MICROSCOPIC
Bilirubin Urine: NEGATIVE
Glucose, UA: NEGATIVE mg/dL
Protein, ur: NEGATIVE mg/dL
Urobilinogen, UA: 0.2 mg/dL (ref 0.0–1.0)

## 2013-11-07 LAB — CBC WITH DIFFERENTIAL/PLATELET
Basophils Absolute: 0 10*3/uL (ref 0.0–0.1)
Basophils Relative: 0 % (ref 0–1)
Eosinophils Absolute: 0.2 10*3/uL (ref 0.0–0.7)
Eosinophils Relative: 1 % (ref 0–5)
HCT: 38.5 % (ref 36.0–46.0)
Lymphocytes Relative: 5 % — ABNORMAL LOW (ref 12–46)
MCV: 96.5 fL (ref 78.0–100.0)
Monocytes Absolute: 1.4 10*3/uL — ABNORMAL HIGH (ref 0.1–1.0)
Monocytes Relative: 8 % (ref 3–12)
Neutro Abs: 14.5 10*3/uL — ABNORMAL HIGH (ref 1.7–7.7)
Neutrophils Relative %: 86 % — ABNORMAL HIGH (ref 43–77)
RDW: 13.1 % (ref 11.5–15.5)
WBC: 16.9 10*3/uL — ABNORMAL HIGH (ref 4.0–10.5)

## 2013-11-07 LAB — CG4 I-STAT (LACTIC ACID): Lactic Acid, Venous: 3.13 mmol/L — ABNORMAL HIGH (ref 0.5–2.2)

## 2013-11-07 LAB — PROCALCITONIN: Procalcitonin: 0.71 ng/mL

## 2013-11-07 LAB — URINE MICROSCOPIC-ADD ON

## 2013-11-07 LAB — PROTIME-INR: INR: 2.2 — ABNORMAL HIGH (ref 0.00–1.49)

## 2013-11-07 LAB — MRSA PCR SCREENING: MRSA by PCR: NEGATIVE

## 2013-11-07 MED ORDER — LEVOFLOXACIN IN D5W 750 MG/150ML IV SOLN
750.0000 mg | Freq: Once | INTRAVENOUS | Status: AC
  Administered 2013-11-07: 750 mg via INTRAVENOUS
  Filled 2013-11-07: qty 150

## 2013-11-07 MED ORDER — ACETAMINOPHEN 325 MG PO TABS
325.0000 mg | ORAL_TABLET | Freq: Once | ORAL | Status: AC
  Administered 2013-11-07: 325 mg via ORAL
  Filled 2013-11-07: qty 1

## 2013-11-07 MED ORDER — DEXTROSE 5 % IV SOLN
1.0000 g | Freq: Once | INTRAVENOUS | Status: AC
  Administered 2013-11-07: 1 g via INTRAVENOUS
  Filled 2013-11-07: qty 10

## 2013-11-07 MED ORDER — AZTREONAM 1 G IJ SOLR
1.0000 g | Freq: Three times a day (TID) | INTRAMUSCULAR | Status: DC
  Filled 2013-11-07 (×2): qty 1

## 2013-11-07 MED ORDER — VANCOMYCIN HCL IN DEXTROSE 750-5 MG/150ML-% IV SOLN
750.0000 mg | Freq: Two times a day (BID) | INTRAVENOUS | Status: DC
  Filled 2013-11-07: qty 150

## 2013-11-07 MED ORDER — ATORVASTATIN CALCIUM 80 MG PO TABS: 80.0000 mg | ORAL_TABLET | Freq: Every day | ORAL | Status: DC

## 2013-11-07 MED ORDER — DEXTROSE 5 % IV SOLN
1.0000 g | Freq: Three times a day (TID) | INTRAVENOUS | Status: DC
  Administered 2013-11-07 – 2013-11-08 (×2): 1 g via INTRAVENOUS
  Filled 2013-11-07 (×5): qty 1

## 2013-11-07 MED ORDER — SODIUM CHLORIDE 0.9 % IV SOLN
1000.0000 mL | Freq: Once | INTRAVENOUS | Status: AC
  Administered 2013-11-07: 1000 mL via INTRAVENOUS

## 2013-11-07 MED ORDER — PROMETHAZINE HCL 12.5 MG PO TABS: 12.5000 mg | ORAL_TABLET | Freq: Four times a day (QID) | ORAL | Status: DC | PRN

## 2013-11-07 MED ORDER — DEXTROSE 5 % IV SOLN
2.0000 g | Freq: Once | INTRAVENOUS | Status: AC
  Administered 2013-11-07: 2 g via INTRAVENOUS
  Filled 2013-11-07: qty 2

## 2013-11-07 MED ORDER — POTASSIUM CHLORIDE CRYS ER 20 MEQ PO TBCR
20.0000 meq | EXTENDED_RELEASE_TABLET | Freq: Every day | ORAL | Status: DC
  Filled 2013-11-07 (×2): qty 1

## 2013-11-07 MED ORDER — DEXTROSE 5 % IV SOLN
1.0000 g | Freq: Three times a day (TID) | INTRAVENOUS | Status: DC
  Filled 2013-11-07 (×2): qty 1

## 2013-11-07 MED ORDER — ACETAMINOPHEN 325 MG PO TABS
325.0000 mg | ORAL_TABLET | Freq: Four times a day (QID) | ORAL | Status: DC | PRN
  Administered 2013-11-07 – 2013-11-10 (×6): 325 mg via ORAL
  Filled 2013-11-07 (×6): qty 1

## 2013-11-07 MED ORDER — METRONIDAZOLE IN NACL 5-0.79 MG/ML-% IV SOLN
500.0000 mg | Freq: Three times a day (TID) | INTRAVENOUS | Status: DC
  Administered 2013-11-07 – 2013-11-08 (×2): 500 mg via INTRAVENOUS
  Filled 2013-11-07 (×4): qty 100

## 2013-11-07 MED ORDER — SODIUM CHLORIDE 0.9 % IV SOLN
INTRAVENOUS | Status: DC
  Administered 2013-11-08: 05:00:00 via INTRAVENOUS
  Administered 2013-11-08: 1000 mL via INTRAVENOUS

## 2013-11-07 MED ORDER — SODIUM CHLORIDE 0.9 % IJ SOLN
3.0000 mL | Freq: Two times a day (BID) | INTRAMUSCULAR | Status: DC
  Administered 2013-11-07 – 2013-11-09 (×3): 3 mL via INTRAVENOUS

## 2013-11-07 MED ORDER — ACETAMINOPHEN 325 MG PO TABS
650.0000 mg | ORAL_TABLET | Freq: Once | ORAL | Status: AC
  Administered 2013-11-07: 650 mg via ORAL
  Filled 2013-11-07: qty 2

## 2013-11-07 MED ORDER — VANCOMYCIN HCL IN DEXTROSE 1-5 GM/200ML-% IV SOLN
1000.0000 mg | Freq: Once | INTRAVENOUS | Status: AC
  Administered 2013-11-07: 1000 mg via INTRAVENOUS
  Filled 2013-11-07: qty 200

## 2013-11-07 MED ORDER — DILTIAZEM HCL ER COATED BEADS 240 MG PO CP24
240.0000 mg | ORAL_CAPSULE | Freq: Every day | ORAL | Status: DC
  Administered 2013-11-07 – 2013-11-10 (×4): 240 mg via ORAL
  Filled 2013-11-07 (×4): qty 1

## 2013-11-07 MED ORDER — SODIUM CHLORIDE 0.9 % IV SOLN
1000.0000 mL | INTRAVENOUS | Status: DC
  Administered 2013-11-07: 1000 mL via INTRAVENOUS

## 2013-11-07 MED ORDER — ROSUVASTATIN CALCIUM 5 MG PO TABS
5.0000 mg | ORAL_TABLET | Freq: Every day | ORAL | Status: DC
  Administered 2013-11-08 – 2013-11-10 (×3): 5 mg via ORAL
  Filled 2013-11-07 (×3): qty 1

## 2013-11-07 MED ORDER — POTASSIUM CHLORIDE 20 MEQ/15ML (10%) PO LIQD
20.0000 meq | Freq: Every day | ORAL | Status: DC
  Administered 2013-11-07 – 2013-11-09 (×3): 20 meq via ORAL
  Filled 2013-11-07 (×3): qty 15

## 2013-11-07 MED ORDER — SODIUM CHLORIDE 0.9 % IV SOLN
INTRAVENOUS | Status: AC
  Administered 2013-11-07: 18:00:00 via INTRAVENOUS

## 2013-11-07 NOTE — Progress Notes (Signed)
ANTICOAGULATION CONSULT NOTE - Initial Consult  Pharmacy Consult for Heparin Indication: atrial fibrillation  Allergies  Allergen Reactions  . Contrast Media [Iodinated Diagnostic Agents] Hives  . Levaquin [Levofloxacin] Itching and Rash  . Penicillins Rash    "haven't had it in years"  . Vancomycin Itching and Rash    Patient Measurements: Height: 5\' 5"  (165.1 cm) Weight: 167 lb (75.751 kg) IBW/kg (Calculated) : 57  Vital Signs: Temp: 99.6 F (37.6 C) (11/26 1343) Temp src: Oral (11/26 1343) BP: 114/57 mmHg (11/26 1630) Pulse Rate: 89 (11/26 1630)  Labs:  Recent Labs  11/07/13 1308  HGB 13.2  HCT 38.5  PLT 181  LABPROT 23.7*  INR 2.20*  CREATININE 0.59    Estimated Creatinine Clearance: 47.6 ml/min (by C-G formula based on Cr of 0.59).   Medical History: Past Medical History  Diagnosis Date  . Coronary artery disease     Nonobstructive  . GERD (gastroesophageal reflux disease)   . Headache(784.0)   . High cholesterol   . DVT (deep venous thrombosis), right 2005    "dry blood; after 8 foot fall"  . Chronic bronchitis     "get it basically 3 times/yr"  . Blood transfusion 1960  . H/O hiatal hernia   . Rheumatoid arthritis(714.0)   . Concussion w/o coma 03/31/2004    Complicated by subarachnoid hemorrhage.  "even now has times when she's not able to comprehend" (05/08/12)  . Chronic atrial fibrillation     Anticoagulated with warfarin, rate control with diltiazem and Toprol  . Pneumonia ~ 2010 AND 2013  . 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 Diverticulitis   . Diverticulosis   . External hemorrhoids   . Fatty liver   . AAA (abdominal aortic aneurysm)   . Edema of both legs     Chronic, thought to be secondary to DVTs    Medications:   (Not in a hospital admission)  Assessment: 79 yo F admitted 11/07/2013  With vomiting and malaise diagnosed with sepsis.  To transition to heparin when INR <  goal.   Goal of Therapy:  Heparin level 0.3-0.7 units/ml Monitor platelets by anticoagulation protocol: Yes   Plan:  1. Follow up INR, when < 2.0 start heparin, no bolus 2. Daily INR   Merrily Brittle 11/07/2013,4:49 PM

## 2013-11-07 NOTE — ED Notes (Signed)
NOTIFIED DR. Annita Brod IN PERSON OF PATIENTS LAB RESULTS OF CG4 LACTIC ACID = 3.64mmoI/L

## 2013-11-07 NOTE — Consult Note (Signed)
Gastroenterology Consult: 4:02 PM 11/07/2013  LOS: 0 days    Referring Provider: ED MD, Dr Mahala Menghini Primary Care Physician:  Pearson Grippe, MD Primary Gastroenterologist:  Dr. Juanda Chance   Reason for Consultation:  Fever and chronic abdominal pain.   HPI: [name] is a 79 y.o. female.  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  TURBT 10/2011.  Chronic atrial fibrillation on Coumadin. Tubular adenoma in 2000.  2003 colonoscopy: no recurrent polyps. 2008 colonoscopy: severe left colon diverticulosis.  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 Medical mgt as pt is not an ideal surgery candidate. Course of Cipro, Flagyl for 21 days in July 2014.      Seen by Dr Juanda Chance 10/16/13. Pt has been having intermittent pain in mid abdomen. Stool softeners and daily metamucil was recommended. She is eating well and having daily BMs CT repeated 10/16/13 showed "One of the 2 previously described colo enteric fistula is again identified. It is much narrower and does not fill with either contrast or gas, and there is no surrounding inflammation. The second adjacent fistula is no longer present".     Yesterday ate pizza and salad with the rest of her family.  This AM at 1045 had shaking chills and nausea, thre up 2 x, first clear and foamy, second time darker like the coffee she had for breakfast Brought to ED today.  Temp is 103.6. WBCs 16.9.  U/A not c/w at UTI She is not having exacerbation of her abdominal pain.  Last BM was brown, this AM.  Felt fine this AM until 1045. Chills resolved after given tylenol in ED. Empiric Flagyl, vanco and Azactam has been ordered. Dosed with Vancomycin, levaquin, rocephin in ED once in ED CT abdomen/pelvis has  been ordered.   Past Medical History  Diagnosis Date  . Coronary artery disease     Nonobstructive  . GERD (gastroesophageal reflux disease)   . Headache(784.0)   . High cholesterol   . DVT (deep venous thrombosis), right 2005    "dry blood; after 8 foot fall"  . Chronic bronchitis     "get it basically 3 times/yr"  . Blood transfusion 1960  . H/O hiatal hernia   . Rheumatoid arthritis(714.0)   . Concussion w/o coma 03/31/2004    Complicated by subarachnoid hemorrhage.  "even now has times when she's not able to comprehend" (05/08/12)  . Chronic atrial fibrillation     Anticoagulated with warfarin, rate control with diltiazem and Toprol  . Pneumonia ~ 2010 AND 2013  . 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 Diverticulitis   . Diverticulosis   . External hemorrhoids   . Fatty liver   . AAA (abdominal aortic aneurysm)   . Edema of both legs     Chronic, thought to be secondary to DVTs    Past Surgical History  Procedure Laterality Date  . Transurethral resection of bladder tumor  11/11/2011    Procedure:  TRANSURETHRAL RESECTION OF BLADDER TUMOR (TURBT);  Surgeon: Anner Crete;  Location: [facility];  Service: Urology;  Laterality: N/A;  Cysto, Bladder Biopsy, TURBT with Gyrus,   . Cystoscopy  11/11/2011    Procedure: CYSTOSCOPY;  Surgeon: Anner Crete;  Location: [facility];  Service: Urology;  Laterality: N/A;  . Bladder lift  YRS AGO  . Bladder cancer  2010; 2009    "for tumors on surface of bladder"  . Knee arthroscopy  2005    right; S/P fall  . Cataract extraction w/ intraocular lens  implant, bilateral Bilateral 1992  . Back surgery  1976 UPPER BACK    tumor removed-benign  . Tonsillectomy  1952  . Breast cyst excision  YRS AGO    2 cysts removed-left breast-benign  . Cardiac catheterization  1980's  . Abdominal hysterectomy  1960    partial hysterectomy  . Left thumb  benign tumor removed  1980'S  . Total knee arthroplasty  Right 02/05/2013    Procedure: TOTAL KNEE ARTHROPLASTY;  Surgeon: Shelda Pal, MD;  Location: [facility];  Service: Orthopedics;  Laterality: Right;  . Cystostomy w/ bladder biopsy  10/08/2013    Prior to Admission medications   Medication Sig Start Date End Date Taking? Authorizing Provider  acetaminophen (TYLENOL) 500 MG tablet Take 1,000 mg by mouth every 6 (six) hours as needed for pain.   Yes Historical Provider, MD  calcium carbonate (OS-CAL - DOSED IN MG OF ELEMENTAL CALCIUM) 1250 MG tablet Take 1 tablet by mouth daily.   Yes Historical Provider, MD  cholecalciferol (VITAMIN D) 1000 UNITS tablet Take 1,000 Units by mouth daily.   Yes Historical Provider, MD  Cinnamon 500 MG capsule Take 1,000 mg by mouth daily.   Yes Historical Provider, MD  diltiazem (CARDIZEM CD) 240 MG 24 hr capsule Take 240 mg by mouth daily. 11/02/13  Yes Historical Provider, MD  fish oil-omega-3 fatty acids 1000 MG capsule Take 1 g by mouth 2 (two) times daily.    Yes Historical Provider, MD  furosemide (LASIX) 40 MG tablet Take 40 mg by mouth daily.   Yes Historical Provider, MD  guaiFENesin (MUCINEX) 600 MG 12 hr tablet Take 600 mg by mouth 2 (two) times daily as needed for congestion.   Yes Historical Provider, MD  metoprolol (TOPROL-XL) 50 MG 24 hr tablet Take 50 mg by mouth at bedtime.    Yes Historical Provider, MD  Multiple Vitamins-Minerals (MULTIVITAMINS THER. W/MINERALS) TABS Take 1 tablet by mouth daily.    Yes Historical Provider, MD  omeprazole (PRILOSEC) 20 MG capsule Take 20 mg by mouth 2 (two) times daily.   Yes Historical Provider, MD  psyllium (METAMUCIL) 58.6 % powder Take 1 packet by mouth 2 (two) times daily.    Yes Historical Provider, MD  rosuvastatin (CRESTOR) 5 MG tablet Take 5 mg by mouth daily.    Yes Historical Provider, MD  spironolactone (ALDACTONE) 25 MG tablet Take 25 mg by mouth daily.   Yes Historical Provider, MD  warfarin (COUMADIN) 5 MG tablet Take 5 mg by mouth daily. Take 7.5mg   by mouth on Thurs, take 5mg  every other day   Yes Historical Provider, MD    Scheduled Meds:   Infusions: . sodium chloride 1,000 mL (11/07/13 1343)  . metronidazole     PRN Meds:    Allergies as of 11/07/2013 - Review Complete 11/07/2013  Allergen Reaction Noted  . Contrast media [iodinated diagnostic agents] Hives 11/08/2011  . Levaquin [levofloxacin] Itching  and Rash 11/07/2013  . Penicillins Rash 11/01/2011  . Vancomycin Itching and Rash 11/07/2013    Family History  Problem Relation Age of Onset  . Anesthesia problems Daughter   . Prostate cancer Brother   . Breast cancer Other     neice  . Heart disease Brother   . Colon cancer Neg Hx     History   Social History  . Marital Status: Widowed    Spouse Name: N/A    Number of Children: 3  . Years of Education: N/A   Occupational History  . Retired    Social History Main Topics  . Smoking status: Former Smoker -- 1.00 packs/day for 50 years    Types: Cigarettes    Quit date: 11/07/1982  . Smokeless tobacco: Never Used  . Alcohol Use: 4.2 oz/week    7 Glasses of wine per week  . Drug Use: No  . Sexual Activity: No   Other Topics Concern  . Not on file   Social History Narrative   She is a widowed mother of 3, grandmother of 5, great grandmother of 5. She does not   really get routine exercise. She quit smoking in 1983. She does not smoke and does not use illicit drugs and does not drink.    REVIEW OF SYSTEMS: No sick contacts, no rash,  No blurred vision/double vision, no weakness on any one side of body,  Usually is ambulatory, can drive, cooks for herself.  + chronic, intermittent, lower extremity edema No dyspnea or cough  + headaches and neck pain.  No dysuria, or frequency.  No falls or any syncopal episodes     PHYSICAL EXAM: Vital signs in last 24 hours: Filed Vitals:   11/07/13 1530  BP: 114/54  Pulse: 105  Temp:   Resp: 16   Wt Readings from Last 3 Encounters:  11/07/13 75.751  kg (167 lb)  10/16/13 72.802 kg (160 lb 8 oz)  07/13/13 71.725 kg (158 lb 2 oz)    General: Looks well and young for age Head:  No trauma or swelling  Eyes:  No icterus or pallor Ears:  + HOH  Nose:  No discharge Mouth:  No sores, no exudates Neck:  No mass or JVD Lungs:  Crackle are fine in right base Heart: RRR, no MRG Abdomen:  Soft, minimal mid, bilateral abdominal tenderness, no masses.   Rectal: deferred   Musc/Skeltl: no joint swelling Extremities:  No pedal edema.  Feet warm  Neurologic:  Oriented x 3.  Skin:  No sores, no rash, no purpura Tattoos:  none Nodes:  No cervical adenopathy   Psych:  Pleasant, in good spirits.   Intake/Output from previous day:   Intake/Output this shift:    LAB RESULTS:  Recent Labs  11/07/13 1308  WBC 16.9*  HGB 13.2  HCT 38.5  PLT 181   BMET Lab Results  Component Value Date   NA 136 11/07/2013   NA 135 06/11/2013   NA 128* 02/07/2013   K 3.2* 11/07/2013   K 4.6 06/11/2013   K 4.4 02/07/2013   CL 97 11/07/2013   CL 95* 06/11/2013   CL 94* 02/07/2013   CO2 26 11/07/2013   CO2 25 06/11/2013   CO2 27 02/07/2013   GLUCOSE 89 11/07/2013   GLUCOSE 122* 06/11/2013   GLUCOSE 194* 02/07/2013   BUN 12 11/07/2013   BUN 14 06/11/2013   BUN 12 02/07/2013   CREATININE 0.59 11/07/2013   CREATININE 0.8  06/11/2013   CREATININE 0.65 02/07/2013   CALCIUM 8.6 11/07/2013   CALCIUM 9.8 06/11/2013   CALCIUM 8.5 02/07/2013   LFT  Recent Labs  11/07/13 1308  PROT 7.0  ALBUMIN 3.8  AST 22  ALT 16  ALKPHOS 84  BILITOT 0.5   PT/INR Lab Results  Component Value Date   INR 2.20* 11/07/2013   INR 1.72* 02/08/2013   INR 1.21 02/07/2013   Lipase     Component Value Date/Time   LIPASE 36 05/10/2012 0450     RADIOLOGY STUDIES: Dg Chest Port 1 View  11/07/2013   CLINICAL DATA:  Weakness and fever  EXAM: PORTABLE CHEST - 1 VIEW  COMPARISON:  02/07/2013  FINDINGS: The cardiac shadow is stable. Mild interstitial changes are again noted  bilaterally without focal infiltrate or sizable effusion no acute bony abnormality is seen.  IMPRESSION: Chronic changes without acute abnormality.   Electronically Signed   By: Alcide Clever M.D.   On: 11/07/2013 12:40    IMPRESSION:   *  Fever and elevated WBC count.  Pt with hx coloenteric fistulas since 05/2013.  These improved by 10/16/13 CT scan.  These may have flared and CT is ordered to reevaluate.  Rule out diverticulitis, though not having significant pain or tenderness as would be expected.   *  Hypokalemia.   *  Chronic Coumadin. Therapeutic INR  *  Hx bladder cancer. Chronic hematuria, confirmed by U/A   PLAN:     *  Await CT scan, empiric abx as ordered.   *  She can have clears after the CT scan.    [name]  11/07/2013, 4:02 PM Pager: [phone]      Attending physician's note   I have taken a history, examined the patient and reviewed the chart. I agree with the Advanced Practitioner's note, impression and recommendations. Fever of 103.6 with chills, mild hypotension, N/V. Her colo-enteric fistulae were substantially improved on CT 2 weeks ago. She has mild chronic abdominal pain and chronic constipation and both are unchanged so diverticulitis, abscess, or other acute abdominal process may not be the primary problem today.  Await abd/pelvic CT. If no acute finding on CT the primary service will need to investigate for other sources of infection.  Meryl Dare, MD Clement[facility] Graham

## 2013-11-07 NOTE — ED Provider Notes (Signed)
CSN: [id number]     Arrival date & time 11/07/13  1140 History   First MD Initiated Contact with Patient 11/07/13 1144     Chief Complaint  Patient presents with  . Emesis   (Consider location/radiation/quality/duration/timing/severity/associated sxs/prior Treatment) HPI Complains of vomiting, general malaise and shaking chills onset approximately 11 AM today. Her daughter states that she went to adult daycare earlier this morning, felt well. Denies cough denies shortness of breath denies pain anywhere. Denies diarrhea. EMS treated patient with 150 mL bolus normal saline intravenously and Zofran formula grams IV. Past Medical History  Diagnosis Date  . Coronary artery disease     Nonobstructive  . GERD (gastroesophageal reflux disease)   . Headache(784.0)   . High cholesterol   . DVT (deep venous thrombosis), right 2005    "dry blood; after 8 foot fall"  . Chronic bronchitis     "get it basically 3 times/yr"  . Blood transfusion 1960  . H/O hiatal hernia   . Rheumatoid arthritis(714.0)   . Concussion w/o coma 03/31/2004    Complicated by subarachnoid hemorrhage.  "even now has times when she's not able to comprehend" (05/08/12)  . Chronic atrial fibrillation     Anticoagulated with warfarin, rate control with diltiazem and Toprol  . Pneumonia ~ 2010 AND 2013  . 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 Diverticulitis   . Diverticulosis   . External hemorrhoids   . Fatty liver   . AAA (abdominal aortic aneurysm)   . Edema of both legs     Chronic, thought to be secondary to DVTs   Past Surgical History  Procedure Laterality Date  . Transurethral resection of bladder tumor  11/11/2011    Procedure: TRANSURETHRAL RESECTION OF BLADDER TUMOR (TURBT);  Surgeon: Anner Crete;  Location: [facility];  Service: Urology;  Laterality: N/A;  Cysto, Bladder Biopsy, TURBT with Gyrus,   . Cystoscopy  11/11/2011    Procedure: CYSTOSCOPY;  Surgeon:  Anner Crete;  Location: [facility];  Service: Urology;  Laterality: N/A;  . Bladder lift  YRS AGO  . Bladder cancer  2010; 2009    "for tumors on surface of bladder"  . Knee arthroscopy  2005    right; S/P fall  . Cataract extraction w/ intraocular lens  implant, bilateral Bilateral 1992  . Back surgery  1976 UPPER BACK    tumor removed-benign  . Tonsillectomy  1952  . Breast cyst excision  YRS AGO    2 cysts removed-left breast-benign  . Cardiac catheterization  1980's  . Abdominal hysterectomy  1960    partial hysterectomy  . Left thumb  benign tumor removed  1980'S  . Total knee arthroplasty Right 02/05/2013    Procedure: TOTAL KNEE ARTHROPLASTY;  Surgeon: Shelda Pal, MD;  Location: [facility];  Service: Orthopedics;  Laterality: Right;  . Cystostomy w/ bladder biopsy  10/08/2013   Family History  Problem Relation Age of Onset  . Anesthesia problems Daughter   . Prostate cancer Brother   . Breast cancer Other     neice  . Heart disease Brother   . Colon cancer Neg Hx    History  Substance Use Topics  . Smoking status: Former Smoker -- 1.00 packs/day for 50 years    Types: Cigarettes    Quit date: 11/07/1982  . Smokeless tobacco: Never Used  . Alcohol Use: 4.2 oz/week    7 Glasses of wine per week  OB History   Grav Para Term Preterm Abortions TAB SAB Ect Mult Living                 Review of Systems  Constitutional: Positive for chills.  Gastrointestinal: Positive for nausea and vomiting.  Neurological: Positive for weakness.  All other systems reviewed and are negative.    Allergies  Contrast media and Penicillins  Home Medications   Current Outpatient Rx  Name  Route  Sig  Dispense  Refill  . acetaminophen (TYLENOL) 500 MG tablet   Oral   Take 1,000 mg by mouth every 6 (six) hours as needed for pain.         . calcium carbonate (OS-CAL - DOSED IN MG OF ELEMENTAL CALCIUM) 1250 MG tablet   Oral   Take 1 tablet by mouth daily.         .  cholecalciferol (VITAMIN D) 1000 UNITS tablet   Oral   Take 1,000 Units by mouth daily.         . Cinnamon 500 MG capsule   Oral   Take 1,000 mg by mouth daily.         Marland Kitchen diltiazem (TIAZAC) 240 MG 24 hr capsule   Oral   Take 240 mg by mouth every morning.         . fish oil-omega-3 fatty acids 1000 MG capsule   Oral   Take 1 g by mouth 2 (two) times daily.          . furosemide (LASIX) 40 MG tablet      TAKE ONE TABLET BY MOUTH ONE TIME DAILY   30 tablet   0   . guaiFENesin (MUCINEX) 600 MG 12 hr tablet   Oral   Take 600 mg by mouth 2 (two) times daily as needed for congestion.         . metoprolol (TOPROL-XL) 50 MG 24 hr tablet   Oral   Take 50 mg by mouth at bedtime.          . Multiple Vitamins-Minerals (MULTIVITAMINS THER. W/MINERALS) TABS   Oral   Take 1 tablet by mouth daily.          Marland Kitchen omeprazole (PRILOSEC) 20 MG capsule   Oral   Take 20 mg by mouth 2 (two) times daily.         . psyllium (METAMUCIL) 58.6 % powder   Oral   Take 1 packet by mouth daily.         . rosuvastatin (CRESTOR) 5 MG tablet   Oral   Take 5 mg by mouth at bedtime.          Marland Kitchen spironolactone (ALDACTONE) 25 MG tablet   Oral   Take 25 mg by mouth daily.         Marland Kitchen warfarin (COUMADIN) 5 MG tablet   Oral   Take 5 mg by mouth daily.          BP 126/57  Pulse 89  Temp(Src) 103.6 F (39.8 C) (Rectal)  Resp 16  Ht 5\' 5"  (1.651 m)  Wt 167 lb (75.751 kg)  BMI 27.79 kg/m2  SpO2 95% Physical Exam  Nursing note and vitals reviewed. Constitutional: She appears well-developed and well-nourished. She appears distressed.  Ill-appearing Glasgow Coma Score 15  HENT:  Head: Normocephalic and atraumatic.  Mucous membranes dry  Eyes: Conjunctivae are normal. Pupils are equal, round, and reactive to light.  Neck: Neck supple. No tracheal deviation present. No  thyromegaly present.  Cardiovascular: Normal rate.   No murmur heard. Irregularly irregular   Pulmonary/Chest: Effort normal and breath sounds normal.  Abdominal: Soft. Bowel sounds are normal. She exhibits no distension. There is no tenderness.  Musculoskeletal: Normal range of motion. She exhibits no edema and no tenderness.  Neurological: She is alert. Coordination normal.  Skin: Skin is warm and dry. No rash noted.  Psychiatric: She has a normal mood and affect.    ED Course  Procedures (including critical care time) Labs Review Labs Reviewed  CULTURE, BLOOD (ROUTINE X 2)  CULTURE, BLOOD (ROUTINE X 2)  URINE CULTURE  CBC WITH DIFFERENTIAL  COMPREHENSIVE METABOLIC PANEL  URINALYSIS, ROUTINE W REFLEX MICROSCOPIC  PROTIME-INR   code sepsis called. Chest x-ray viewed by me Imaging Review No results found.  EKG Interpretation    Date/Time:  Wednesday November 07 2013 12:14:24 EST Ventricular Rate:  106 PR Interval:    QRS Duration: 94 QT Interval:  351 QTC Calculation: 466 R Axis:     Text Interpretation:  Atrial fibrillation Abnormal R-wave progression, early transition Anteroseptal infarct, old Abnormal T, consider ischemia, diffuse leads No significant change since last tracing Confirmed by   MD,  ([id number]) on 11/07/2013 12:23:45 PM           1420 p.m. patient noted to have itching at left arm and some redness of left arm while Levaquin was infusing. Levaquin was discontinued. Rocephin 2 g intravenously was ordered by me. At 1520 p.m. I was notified that she had redness at right arm at infusion site of vancomycin. Vancomycin was discontinued by me. At 1531pm itching at arm has subsided. She does have mild redness at right arm at infusion site. Patient feels improved after treatment with intravenous fluids and intravenous antibiotics.  Results for orders placed during the hospital encounter of 11/07/13  CBC WITH DIFFERENTIAL      Result Value Range   WBC 16.9 (*) 4.0 - 10.5 K/uL   RBC 3.99  3.87 - 5.11 MIL/uL   Hemoglobin 13.2  12.0 - 15.0 g/dL    HCT 16.1  09.6 - 04.5 %   MCV 96.5  78.0 - 100.0 fL   MCH 33.1  26.0 - 34.0 pg   MCHC 34.3  30.0 - 36.0 g/dL   RDW 40.9  81.1 - 91.4 %   Platelets 181  150 - 400 K/uL   Neutrophils Relative % 86 (*) 43 - 77 %   Neutro Abs 14.5 (*) 1.7 - 7.7 K/uL   Lymphocytes Relative 5 (*) 12 - 46 %   Lymphs Abs 0.8  0.7 - 4.0 K/uL   Monocytes Relative 8  3 - 12 %   Monocytes Absolute 1.4 (*) 0.1 - 1.0 K/uL   Eosinophils Relative 1  0 - 5 %   Eosinophils Absolute 0.2  0.0 - 0.7 K/uL   Basophils Relative 0  0 - 1 %   Basophils Absolute 0.0  0.0 - 0.1 K/uL  COMPREHENSIVE METABOLIC PANEL      Result Value Range   Sodium 136  135 - 145 mEq/L   Potassium 3.2 (*) 3.5 - 5.1 mEq/L   Chloride 97  96 - 112 mEq/L   CO2 26  19 - 32 mEq/L   Glucose, Bld 89  70 - 99 mg/dL   BUN 12  6 - 23 mg/dL   Creatinine, Ser 7.82  0.50 - 1.10 mg/dL   Calcium 8.6  8.4 - 95.6 mg/dL   Total  Protein 7.0  6.0 - 8.3 g/dL   Albumin 3.8  3.5 - 5.2 g/dL   AST 22  0 - 37 U/L   ALT 16  0 - 35 U/L   Alkaline Phosphatase 84  39 - 117 U/L   Total Bilirubin 0.5  0.3 - 1.2 mg/dL   GFR calc non Af Amer 78 (*) >90 mL/min   GFR calc Af Amer >90  >90 mL/min  URINALYSIS, ROUTINE W REFLEX MICROSCOPIC      Result Value Range   Color, Urine YELLOW  YELLOW   APPearance CLEAR  CLEAR   Specific Gravity, Urine 1.007  1.005 - 1.030   pH 5.5  5.0 - 8.0   Glucose, UA NEGATIVE  NEGATIVE mg/dL   Hgb urine dipstick LARGE (*) NEGATIVE   Bilirubin Urine NEGATIVE  NEGATIVE   Ketones, ur NEGATIVE  NEGATIVE mg/dL   Protein, ur NEGATIVE  NEGATIVE mg/dL   Urobilinogen, UA 0.2  0.0 - 1.0 mg/dL   Nitrite NEGATIVE  NEGATIVE   Leukocytes, UA NEGATIVE  NEGATIVE  PROTIME-INR      Result Value Range   Prothrombin Time 23.7 (*) 11.6 - 15.2 seconds   INR 2.20 (*) 0.00 - 1.49  URINE MICROSCOPIC-ADD ON      Result Value Range   Squamous Epithelial / LPF RARE  RARE   WBC, UA 0-2  <3 WBC/hpf   RBC / HPF 11-20  <3 RBC/hpf   Bacteria, UA RARE  RARE  CG4  I-STAT (LACTIC ACID)      Result Value Range   Lactic Acid, Venous 3.13 (*) 0.5 - 2.2 mmol/L   Ct Abdomen Pelvis Wo Contrast  10/19/2013   CLINICAL DATA:  Followup colo enteric fistula  EXAM: CT ABDOMEN AND PELVIS WITHOUT CONTRAST  TECHNIQUE: Multidetector CT imaging of the abdomen and pelvis was performed following the standard protocol without intravenous contrast.  COMPARISON:  07/02/2013  FINDINGS: On series 2 image number is 60 through 24, there is a thin band of soft tissue extending between the proximal sigmoid colon and a loop of bowel. There is no contrast or gas within this very thin band with no surrounding inflammation. This is much narrower than it was previously and the 2nd band of soft tissue is no longer present. There is again extensive diverticulosis throughout the colon with no active diverticulitis. There is a duodenum diverticulum. There is a large left renal cyst. Kidneys are otherwise normal. Adrenal glands are normal. Spleen, pancreas, and liver are normal. Gallbladder is normal. The aorta shows focal aneurysmal dilatation just above the bifurcation to 3.2 cm, unchanged. The visualized portions of the lung bases are clear and there are no acute musculoskeletal abnormalities.  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 It is much narrower and does not fill with either contrast or gas, and there is no surrounding inflammation. The second adjacent fistula is no longer present.   Electronically Signed   By: Esperanza Heir M.D.   On: 10/19/2013 15:01   Dg Chest Port 1 View  11/07/2013   CLINICAL DATA:  Weakness and fever  EXAM: PORTABLE CHEST - 1 VIEW  COMPARISON:  02/07/2013  FINDINGS: The cardiac shadow is stable. Mild interstitial changes are again noted bilaterally without focal infiltrate or sizable effusion no acute bony abnormality is seen.  IMPRESSION: Chronic changes without acute abnormality.   Electronically Signed   By: Alcide Clever M.D.   On: 11/07/2013 12:40  MDM  No diagnosis found. I spoke with Dr.Samtani plan admit step down unit  Diagnosis #1 sepsis 2 hypokalemia #3 adverse drug reaction CRITICAL CARE Performed by: Doug Sou Total critical care time: 30 minute Critical care time was exclusive of separately billable procedures and treating other patients. Critical care was necessary to treat or prevent imminent or life-threatening deterioration. Critical care was time spent personally by me on the following activities: development of treatment plan with patient and/or surrogate as well as nursing, discussions with consultants, evaluation of patient's response to treatment, examination of patient, obtaining history from patient or surrogate, ordering and performing treatments and interventions, ordering and review of laboratory studies, ordering and review of radiographic studies, pulse oximetry and re-evaluation of patient's condition.    Doug Sou, MD 11/07/13 1534

## 2013-11-07 NOTE — Progress Notes (Signed)
ANTIBIOTIC CONSULT NOTE - INITIAL  Pharmacy Consult for Vancomycin and Aztreonam  Indication: sepsis  Allergies  Allergen Reactions  . Contrast Media [Iodinated Diagnostic Agents] Hives  . Penicillins Rash    "haven't had it in years"    Patient Measurements: Height: 5\' 5"  (165.1 cm) Weight: 167 lb (75.751 kg) IBW/kg (Calculated) : 57  Vital Signs: Temp: 99.6 F (37.6 C) (11/26 1343) Temp src: Oral (11/26 1343) BP: 107/47 mmHg (11/26 1445) Pulse Rate: 98 (11/26 1445) Intake/Output from previous day:   Intake/Output from this shift:    Labs:  Recent Labs  11/07/13 1308  WBC 16.9*  HGB 13.2  PLT 181  CREATININE 0.59   Estimated Creatinine Clearance: 47.6 ml/min (by C-G formula based on Cr of 0.59). No results found for this basename: VANCOTROUGH, VANCOPEAK, VANCORANDOM, GENTTROUGH, GENTPEAK, GENTRANDOM, TOBRATROUGH, TOBRAPEAK, TOBRARND, AMIKACINPEAK, AMIKACINTROU, AMIKACIN,  in the last 72 hours   Microbiology: No results found for this or any previous visit (from the past 720 hour(s)).  Medical History: Past Medical History  Diagnosis Date  . Coronary artery disease     Nonobstructive  . GERD (gastroesophageal reflux disease)   . Headache(784.0)   . High cholesterol   . DVT (deep venous thrombosis), right 2005    "dry blood; after 8 foot fall"  . Chronic bronchitis     "get it basically 3 times/yr"  . Blood transfusion 1960  . H/O hiatal hernia   . Rheumatoid arthritis(714.0)   . Concussion w/o coma 03/31/2004    Complicated by subarachnoid hemorrhage.  "even now has times when she's not able to comprehend" (05/08/12)  . Chronic atrial fibrillation     Anticoagulated with warfarin, rate control with diltiazem and Toprol  . Pneumonia ~ 2010 AND 2013  . 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 Diverticulitis   . Diverticulosis   . External hemorrhoids   . Fatty liver   . AAA (abdominal aortic aneurysm)    . Edema of both legs     Chronic, thought to be secondary to DVTs    Medications:  See med rec  Assessment: 79 y.o. female presents from adult daycare with vomiting, malaise, shaking chills.   ID: To begin broad spectrum antibiotics with Vancomycin and Aztreonam. Allergy to PCN happened many years ago and it was a rash. WBC elevated. LA 3.13. Levaquin 750mg  IV and Aztreonam 2gm given ~1330 in ED. Vanc 1 gm given ~1430 in ED.  Anticoagulation: Coumadin PTA for afib. (Home dose 5 mg daily except for 7.5mg  on Thur). Baseline INR 2.2. Last dose taken 11/25. CBC stable.  Goal of Therapy:  Vancomycin trough level 15-20 mcg/ml  Plan: 1. Aztreonam 1gm IV q8h. 2. Vancomycin 750mg  IV q12h. 3. Will f/u microbiological data, renal function, pt's clinical condition, trough prn 4. Consider using Cefepime instead of Aztreonam (PCN allergy is rash so cross-reactivity reaction low probability) 5. Will f/u continuation of coumadin upon admit   Christoper Fabian, PharmD, BCPS Clinical pharmacist, pager [phone] 11/07/2013,2:50 PM

## 2013-11-07 NOTE — ED Notes (Signed)
Per EMS [facility] and called out for "sick person" pt c/o n/v denies diarrhea. Pt has hx of atrial fib and takes comadin. Pt febrile. Pt has had 150 cc of fluids and 4mg  of IV zofran.

## 2013-11-07 NOTE — H&P (Signed)
[facility] History and Physical  HALENA MOHAR ZOX:[id number] DOB: 03/06/1921 DOA: 11/07/2013  Referring physician: Ethelda Chick, ED-P PCP: Pearson Grippe, MD  Specialists: None  Chief Complaint: Nausea vomiting   HPI: [name] is a 79 y.o. 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 TURBT 10/2011, diverticulosis plus adenomatous polyps, chronic atrial fibrillation, history of fall 2005 with small subarachnoid presented from home after being brought back from adult daycare on the morning of 11/07/2013. It was her birthday yesterday and she had gone out with her family to [employer] and had a meal there. 45 minutes after being dropped a daycare, the director at the facility brought patient back to her home where she lives with her daughter. Patient was cold and shaking and had chills. Blood pressure initially checked at home was 96/72. The ambulance was called and EMS noted her blood pressure 88/70. She had 2 episodes of vomiting in between that time of white material but no blood. She had recently been seen in Dr. 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 She's had a cough for the past couple of weeks and her daughter has allergies however on exam there is no significant findings and she has not had a shortness of breath or  chest pain to go with this .    Review of Systems: The patient denies exposure to ill contacts, new rash, blurred vision double vision, weakness on any one side of body, dysarthria, at baseline usually is ambulatory, can drive, cooks for herself. She does have some lower extremity edema which is relatively common for her to She does not feel overtly short of breath once again She states that she's had a headache on and off every couple of weeks and has had a headache this time for the past week but that this was arthritis in her neck. She clearly is able to move her neck around and had around and does not seem to exhibit any discomfort. She has had no diarrhea and actually is somewhat constipated She's not had any falls or any syncopal episodes    Past Medical History  Diagnosis Date  . Coronary artery disease     Nonobstructive  . GERD (gastroesophageal reflux disease)   . Headache(784.0)   . High cholesterol   . DVT (deep venous thrombosis), right 2005    "dry blood; after 8 foot fall"  . Chronic bronchitis     "get it basically 3 times/yr"  . Blood transfusion 1960  . H/O hiatal hernia   . Rheumatoid arthritis(714.0)   . Concussion w/o coma 03/31/2004    Complicated by subarachnoid hemorrhage.  "even now has times when she's not able to comprehend" (05/08/12)  . Chronic atrial fibrillation     Anticoagulated with warfarin, rate control with diltiazem and Toprol  . Pneumonia ~ 2010  AND 2013  . 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 Diverticulitis   . Diverticulosis   . External hemorrhoids   . Fatty liver   . AAA (abdominal aortic aneurysm)   . Edema of both legs     Chronic, thought to be secondary to DVTs   Past Surgical History  Procedure Laterality Date  . Transurethral resection of bladder tumor  11/11/2011    Procedure: TRANSURETHRAL RESECTION OF BLADDER TUMOR (TURBT);  Surgeon: Anner Crete;  Location: [facility];   Service: Urology;  Laterality: N/A;  Cysto, Bladder Biopsy, TURBT with Gyrus,   . Cystoscopy  11/11/2011    Procedure: CYSTOSCOPY;  Surgeon: Anner Crete;  Location: [facility];  Service: Urology;  Laterality: N/A;  . Bladder lift  YRS AGO  . Bladder cancer  2010; 2009    "for tumors on surface of bladder"  . Knee arthroscopy  2005    right; S/P fall  . Cataract extraction w/ intraocular lens  implant, bilateral Bilateral 1992  . Back surgery  1976 UPPER BACK    tumor removed-benign  . Tonsillectomy  1952  . Breast cyst excision  YRS AGO    2 cysts removed-left breast-benign  . Cardiac catheterization  1980's  . Abdominal hysterectomy  1960    partial hysterectomy  . Left thumb  benign tumor removed  1980'S  . Total knee arthroplasty Right 02/05/2013    Procedure: TOTAL KNEE ARTHROPLASTY;  Surgeon: Shelda Pal, MD;  Location: [facility];  Service: Orthopedics;  Laterality: Right;  . Cystostomy w/ bladder biopsy  10/08/2013   Social History:  reports that she quit smoking about 31 years ago. Her smoking use included Cigarettes. She has a 50 pack-year smoking history. She has never used smokeless tobacco. She reports that she drinks about 4.2 ounces of alcohol per week. She reports that she does not use illicit drugs. Patient currently lives with her daughter who is at bedside   she used to work at [employer] She used to smoke and drink about 50 years ago She is relatively high functioning  Allergies  Allergen Reactions  . Contrast Media [Iodinated Diagnostic Agents] Hives  . Penicillins Rash    "haven't had it in years"    Family History  Problem Relation Age of Onset  . Anesthesia problems Daughter   . Prostate cancer Brother   . Breast cancer Other     neice  . Heart disease Brother   . Colon cancer Neg Hx     Prior to Admission medications   Medication Sig Start Date End Date Taking? Authorizing Provider  acetaminophen (TYLENOL) 500 MG tablet Take 1,000 mg by mouth every 6  (six) hours as needed for pain.   Yes Historical Provider, MD  calcium carbonate (OS-CAL - DOSED IN MG OF ELEMENTAL CALCIUM) 1250 MG tablet Take 1 tablet by mouth daily.   Yes Historical Provider, MD  cholecalciferol (VITAMIN D) 1000 UNITS tablet Take 1,000 Units by mouth daily.   Yes Historical Provider, MD  Cinnamon 500 MG capsule Take 1,000 mg by mouth daily.   Yes Historical Provider, MD  diltiazem (CARDIZEM CD) 240 MG 24 hr capsule Take 240 mg by mouth daily. 11/02/13  Yes Historical Provider, MD  fish oil-omega-3 fatty acids 1000 MG capsule Take 1 g by mouth 2 (two) times daily.    Yes Historical Provider, MD  furosemide (LASIX) 40 MG tablet Take 40 mg by mouth  daily.   Yes Historical Provider, MD  guaiFENesin (MUCINEX) 600 MG 12 hr tablet Take 600 mg by mouth 2 (two) times daily as needed for congestion.   Yes Historical Provider, MD  metoprolol (TOPROL-XL) 50 MG 24 hr tablet Take 50 mg by mouth at bedtime.    Yes Historical Provider, MD  Multiple Vitamins-Minerals (MULTIVITAMINS THER. W/MINERALS) TABS Take 1 tablet by mouth daily.    Yes Historical Provider, MD  omeprazole (PRILOSEC) 20 MG capsule Take 20 mg by mouth 2 (two) times daily.   Yes Historical Provider, MD  psyllium (METAMUCIL) 58.6 % powder Take 1 packet by mouth 2 (two) times daily.    Yes Historical Provider, MD  rosuvastatin (CRESTOR) 5 MG tablet Take 5 mg by mouth daily.    Yes Historical Provider, MD  spironolactone (ALDACTONE) 25 MG tablet Take 25 mg by mouth daily.   Yes Historical Provider, MD  warfarin (COUMADIN) 5 MG tablet Take 5 mg by mouth daily. Take 7.5mg  by mouth on Thurs, take 5mg  every other day   Yes Historical Provider, MD   Physical Exam: Filed Vitals:   11/07/13 1445  BP: 107/47  Pulse: 98  Temp:   Resp: 25     General:  Alert somewhat uncomfortable appearing pleasant Caucasian female looking younger than stated age  Eyes: Mild pallor, no icterus  ENT: Soft supple mild submandibular right-sided  lymphadenopathy , gross amount of wax in both ears TMs appear somewhat red but there is no fluid behind either air . Throat is a little bit inflamed   Neck: See above in addition no thyromegaly no JVD no bruit   Cardiovascular: S1-S2 irregularly irregular heart rate in the low 100s and 90s   Respiratory: Clinically clear no tactile vocal resonance no tactile vocal fremitus   Abdomen: Mildly tender in the suprapubic and lower quadrant , no rebound or guarding   Skin: Grade 2-3 lower extremity edema   Musculoskeletal: Range of motion intact   Psychiatric: Euthymic and in no distress  Neurologic: Cranial 2 through 12 grossly intact, moves all 4 extremities equally, power 5/5, reflexes 2/3, gait not assessed  Labs on Admission:  Basic Metabolic Panel:  Recent Labs Lab 11/07/13 1308  NA 136  K 3.2*  CL 97  CO2 26  GLUCOSE 89  BUN 12  CREATININE 0.59  CALCIUM 8.6   Liver Function Tests:  Recent Labs Lab 11/07/13 1308  AST 22  ALT 16  ALKPHOS 84  BILITOT 0.5  PROT 7.0  ALBUMIN 3.8   No results found for this basename: LIPASE, AMYLASE,  in the last 168 hours No results found for this basename: AMMONIA,  in the last 168 hours CBC:  Recent Labs Lab 11/07/13 1308  WBC 16.9*  NEUTROABS 14.5*  HGB 13.2  HCT 38.5  MCV 96.5  PLT 181   Cardiac Enzymes: No results found for this basename: CKTOTAL, CKMB, CKMBINDEX, TROPONINI,  in the last 168 hours  BNP (last 3 results) No results found for this basename: PROBNP,  in the last 8760 hours CBG: No results found for this basename: GLUCAP,  in the last 168 hours  Radiological Exams on Admission: Dg Chest Port 1 View  11/07/2013   CLINICAL DATA:  Weakness and fever  EXAM: PORTABLE CHEST - 1 VIEW  COMPARISON:  02/07/2013  FINDINGS: The cardiac shadow is stable. Mild interstitial changes are again noted bilaterally without focal infiltrate or sizable effusion no acute bony abnormality is seen.  IMPRESSION: Chronic changes  without acute abnormality.   Electronically Signed   By: Alcide Clever M.D.   On: 11/07/2013 12:40    EKG: Independently reviewed. Atrial fibrillation rates in the low 100s QRS axis 0, ST T wave depressions in V4 through 6 which do not appear to be new. No Q waves.  Assessment/Plan Principal Problem:   Colo-enteric fistula-given that patient has had a recent history of the same, we'll get stat CT abdomen pelvis with contrast. Will start empiric ciprofloxacin and Flagyl. Doubt that there is any other sources such as meningitis, pyelonephritis given no other concrete signs or symptoms or lab work suggestive of the same Active Problems:   DVT (deep venous thrombosis), right-see below   Rheumatoid arthritis(714.0)-stable at present outpatient workup needed   Dyslipidemia-continue statin Lipitor 80 mg, transitioned back to Crestor on discharge   Chronic atrial fibrillation-needs to be on anticoagulation.  transition to heparin as might need surgery-may be able to transition back to Coumadin if no surgery planned.  Have held her metoprolol 50 mg each bedtime, will continue her Cardizem 240 mg extended release.  Will hold off on Lasix 40 mg as well as Aldactone 25 daily given hypertension   Sepsis-patient meets sepsis criteria with a lactic acid of 3.1, mild hypotension responsive to fluid-cycle CBC pro calcitonin, lactic acid and monitor closely in stepped-down status   Hypokalemia-replace with potassium IV   Full CODE STATUS Discussed with both daughters 5-7 days hospital stay  Time spent: 75 minutes including 50% face face direct contact with patient   Rhetta Mura [facility] Pager [phone]   If 7PM-7AM, please contact night-coverage [email or web address] Password [facility] 11/07/2013, 2:58 PM

## 2013-11-08 DIAGNOSIS — L02419 Cutaneous abscess of limb, unspecified: Secondary | ICD-10-CM | POA: Diagnosis not present

## 2013-11-08 DIAGNOSIS — R509 Fever, unspecified: Secondary | ICD-10-CM | POA: Diagnosis not present

## 2013-11-08 DIAGNOSIS — K5730 Diverticulosis of large intestine without perforation or abscess without bleeding: Secondary | ICD-10-CM | POA: Diagnosis not present

## 2013-11-08 DIAGNOSIS — L03119 Cellulitis of unspecified part of limb: Secondary | ICD-10-CM

## 2013-11-08 DIAGNOSIS — K632 Fistula of intestine: Secondary | ICD-10-CM | POA: Diagnosis not present

## 2013-11-08 DIAGNOSIS — A088 Other specified intestinal infections: Secondary | ICD-10-CM | POA: Diagnosis not present

## 2013-11-08 DIAGNOSIS — A084 Viral intestinal infection, unspecified: Secondary | ICD-10-CM | POA: Diagnosis present

## 2013-11-08 DIAGNOSIS — L03115 Cellulitis of right lower limb: Secondary | ICD-10-CM | POA: Diagnosis present

## 2013-11-08 LAB — COMPREHENSIVE METABOLIC PANEL
ALT: 12 U/L (ref 0–35)
AST: 14 U/L (ref 0–37)
Albumin: 2.8 g/dL — ABNORMAL LOW (ref 3.5–5.2)
Alkaline Phosphatase: 62 U/L (ref 39–117)
BUN: 10 mg/dL (ref 6–23)
Calcium: 7.8 mg/dL — ABNORMAL LOW (ref 8.4–10.5)
Creatinine, Ser: 0.71 mg/dL (ref 0.50–1.10)
GFR calc non Af Amer: 74 mL/min — ABNORMAL LOW (ref >90)
Potassium: 3.5 mEq/L (ref 3.5–5.1)
Total Protein: 5.7 g/dL — ABNORMAL LOW (ref 6.0–8.3)

## 2013-11-08 LAB — URINE CULTURE: Colony Count: NO GROWTH

## 2013-11-08 LAB — PROTIME-INR: Prothrombin Time: 23.4 seconds — ABNORMAL HIGH (ref 11.6–15.2)

## 2013-11-08 LAB — CBC
HCT: 32.3 % — ABNORMAL LOW (ref 36.0–46.0)
Hemoglobin: 11.1 g/dL — ABNORMAL LOW (ref 12.0–15.0)
MCV: 96.1 fL (ref 78.0–100.0)
RBC: 3.36 MIL/uL — ABNORMAL LOW (ref 3.87–5.11)
WBC: 16.6 10*3/uL — ABNORMAL HIGH (ref 4.0–10.5)

## 2013-11-08 LAB — CLOSTRIDIUM DIFFICILE BY PCR: Toxigenic C. Difficile by PCR: NEGATIVE

## 2013-11-08 MED ORDER — WARFARIN - PHARMACIST DOSING INPATIENT: Freq: Every day | Status: DC

## 2013-11-08 MED ORDER — WARFARIN SODIUM 10 MG PO TABS
10.0000 mg | ORAL_TABLET | Freq: Once | ORAL | Status: AC
  Administered 2013-11-08: 10 mg via ORAL
  Filled 2013-11-08: qty 1

## 2013-11-08 MED ORDER — SODIUM CHLORIDE 0.9 % IV BOLUS (SEPSIS)
250.0000 mL | Freq: Once | INTRAVENOUS | Status: AC
  Administered 2013-11-08: 250 mL via INTRAVENOUS

## 2013-11-08 MED ORDER — DEXTROSE 5 % IV SOLN
1.0000 g | INTRAVENOUS | Status: DC
  Administered 2013-11-08 – 2013-11-10 (×3): 1 g via INTRAVENOUS
  Filled 2013-11-08 (×3): qty 10

## 2013-11-08 NOTE — Progress Notes (Signed)
[facility] Progress Note  Subjective:  Feels ok.  Has a headache.  Abdominal symptoms are better but still has soreness in lower abdomen.  No vomiting.  Objective:  Vital signs in last 24 hours: Temp:  [97.9 F (36.6 C)-103.6 F (39.8 C)] 98.9 F (37.2 C) (11/27 0800) Pulse Rate:  [37-118] 91 (11/27 0400) Resp:  [16-27] 27 (11/27 0400) BP: (98-129)/(37-60) 111/56 mmHg (11/27 0932) SpO2:  [90 %-99 %] 96 % (11/27 0400) Weight:  [166 lb 14.2 oz (75.7 kg)-167 lb (75.751 kg)] 166 lb 14.2 oz (75.7 kg) (11/26 1721)   General:  Alert, Well-developed, in NAD Heart:  Regular rate and rhythm; no murmurs Pulm:  CTAB.  No W/R/R.  Tachypneic. Abdomen:  Soft, non-distended.  BS present.  Mild left sided TTP without R/R/G.   Extremities:  Without edema. Neurologic:  Alert and  oriented x4;  grossly normal neurologically. Psych:  Alert and cooperative. Normal mood and affect.  Intake/Output from previous day: 11/26 0701 - 11/27 0700 In: 325 [I.V.:125; IV Piggyback:200] Out: 2 [Urine:1; Stool:1]  Lab Results:  Recent Labs  11/07/13 1308 11/08/13 0524  WBC 16.9* 16.6*  HGB 13.2 11.1*  HCT 38.5 32.3*  PLT 181 154   BMET  Recent Labs  11/07/13 1308 11/08/13 0524  NA 136 131*  K 3.2* 3.5  CL 97 97  CO2 26 24  GLUCOSE 89 116*  BUN 12 10  CREATININE 0.59 0.71  CALCIUM 8.6 7.8*   LFT  Recent Labs  11/08/13 0524  PROT 5.7*  ALBUMIN 2.8*  AST 14  ALT 12  ALKPHOS 62  BILITOT 0.5   PT/INR  Recent Labs  11/07/13 1308 11/08/13 0524  LABPROT 23.7* 23.4*  INR 2.20* 2.16*   Ct Abdomen Pelvis Wo Contrast  11/07/2013   CLINICAL DATA:  Coloenteric fistula.  EXAM: CT ABDOMEN AND PELVIS WITHOUT CONTRAST  TECHNIQUE: Multidetector CT imaging of the abdomen and pelvis was performed following the standard protocol without intravenous contrast.  COMPARISON:  October 19, 2013.  FINDINGS: Visualized lung bases appear normal. These unenhanced images demonstrate no focal  abnormality of on the liver, spleen or pancreas. No gallstones are noted. Adrenal glands appear normal. No hydronephrosis or renal obstruction is noted. No renal or ureteral calculi are noted. Atherosclerotic calcifications of abdominal aorta and iliac arteries are noted. Stable 3.2 cm aneurysmal dilatation of proximal infrarenal abdominal aorta is noted. Stable 6.5 cm cyst is seen arising from the left kidney. No evidence of bowel obstruction is noted. Extensive sigmoid diverticulosis is noted without inflammation. There appears to be no significant change involving the small fistula noted on prior CT scans arising from the proximal sigmoid colon best seen on image number 65 of series 5. Urinary bladder appears normal. No abnormal fluid collection is noted.  IMPRESSION: Stable 3.2 cm infrarenal abdominal aortic aneurysm.  Stable 6.5 cm left renal cyst.  Stable sigmoid diverticulosis without inflammation.  No change is seen involving the residual fistula arising from the proximal sigmoid colon compared to prior exam. This fistula is not seen to definitively fill with contrast, and there is no surrounding inflammation. No abscess or abnormal fluid collection is noted on this study.   Electronically Signed   By: Roque Lias M.D.   On: 11/07/2013 19:42   Dg Chest Port 1 View  11/07/2013   CLINICAL DATA:  Weakness and fever  EXAM: PORTABLE CHEST - 1 VIEW  COMPARISON:  02/07/2013  FINDINGS: The cardiac shadow is stable. Mild interstitial  changes are again noted bilaterally without focal infiltrate or sizable effusion no acute bony abnormality is seen.  IMPRESSION: Chronic changes without acute abnormality.   Electronically Signed   By: Alcide Clever M.D.   On: 11/07/2013 12:40    Assessment / Plan: *Chronic abdominal pain:  Non-contrast CT scan did not show any new or acute GI issues to account for patient's symptoms, including fever or leukocytosis. * Fever and elevated WBC count:  Patient told this is likely a  gastroenteritis since she had nausea, vomiting, etc as well and other evaluation including blood cultures, UA, and chest x-ray were unremarkable. * Chronic anticoagulation therapy with coumadin  * Hx bladder cancer. Chronic hematuria, confirmed by U/A.  -Further recommendations per Dr. Russella Dar.  Likely no further input from GI standpoint.      LOS: 1 day   ZEHR, JESSICA D.  11/08/2013, 10:24 AM  Pager number [phone]    Attending physician's note   I have taken an interval history, reviewed the chart and examined the patient. I agree with the Advanced Practitioner's note, impression and recommendations. CT with no acute findings. Possible gastroenteritis but with high fever and soft BP still concerned about other sources of infection. Defer to primary service. No additional GI recommendations at this time. GI signing off. OP follow up with Dr. Lina Sar as planned.  Venita Lick. Russella Dar, MD [facility]

## 2013-11-08 NOTE — Progress Notes (Addendum)
ANTICOAGULATION CONSULT NOTE - Follow-up Consult  Pharmacy Consult for Heparin>>warfarin  Indication: atrial fibrillation  Allergies  Allergen Reactions  . Contrast Media [Iodinated Diagnostic Agents] Hives  . Levaquin [Levofloxacin] Itching and Rash  . Penicillins Rash    "haven't had it in years"  . Vancomycin Itching and Rash    Patient Measurements: Height: 5\' 5"  (165.1 cm) Weight: 166 lb 14.2 oz (75.7 kg) IBW/kg (Calculated) : 57  Vital Signs: Temp: 98.9 F (37.2 C) (11/27 0800) Temp src: Oral (11/27 0800) BP: 106/52 mmHg (11/27 0400) Pulse Rate: 91 (11/27 0400)  Labs:  Recent Labs  11/07/13 1308 11/08/13 0524  HGB 13.2 11.1*  HCT 38.5 32.3*  PLT 181 154  LABPROT 23.7* 23.4*  INR 2.20* 2.16*  CREATININE 0.59 0.71    Estimated Creatinine Clearance: 47.6 ml/min (by C-G formula based on Cr of 0.71).   Assessment: 79 yo F admitted 11/07/2013  With vomiting and malaise diagnosed with sepsis.  To restart warfarin today for Afib.  Home dose 5mg  daily except 7.5mg  on Thursday. Last dose taken 11/25 and INR remains therapeutic. Will give extra boost today since missed dose yesterday and at lower end of therapeutic.   Goal of Therapy:  INR: 2-3 Monitor platelets by anticoagulation protocol: Yes   Plan:  1. Warfarin 10mg  tonight 2. Daily INR  Thank you, Piedad Climes, PharmD Clinical Pharmacist - Resident Pager: [phone] Pharmacy: [phone] 11/08/2013 9:07 AM

## 2013-11-08 NOTE — Progress Notes (Addendum)
[facility] Progress Note [place] TEAM 1 - Stepdown/ICU TEAM   [name] ZOX:[id number] DOB: 12-28-1920 DOA: 11/07/2013 PCP: Pearson Grippe, MD  Brief narrative: This is a 79 year old female with a past medical history of grade 1 transitional cell bladder cancer status post transurethral resection of her bladder tumor and BCG treatment, chronic A. 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   Pt presented for vomiting, fever (103 in ER) and hypotension after eating out. See H and P.   Per daughter, Dr Juanda Chance performed a CT scan a couple of months ago and was told that the fistula appear to be 95% healed. Regardless of this, they feel that the fistula could have been the cause of the current symptoms. Therefore, a CT of the abd was performed in the ER. This revealed the fistula to be unchanged from the prior films.   She was however, later found to have erythema of her right leg and at this point I am suspecting she has a viral gastroenteritis along with a right leg cellulitis resulting in current symptoms.    Assessment/Plan: Principal Problem:   Sepsis- procalcitonin normal and re-assuring  (a) gastroenteritis- likely viral- cont to manage with anti- emetics and conservative diet (clears right now) she is beginning to have some loose stools today- will check for c diff but doubt this is the cause  (b) cellulitis of right leg? - she has chronic edema of the right leg but, per pt and family, it is more red than usual therefore, cont Rocephin for this- f/u on blood cx  Active Problems:  Hypotension- sepsis related - hold diuretics- may be able to start Metoprolol (for HR) if BP stays in low 100s - hydrate     DVT right leg / A-fib - cont Coumadin- watch INR when on antibiotics    Chronic atrial fibrillation - watch rate closely- CArdizem resumed but holding off on Metoprolol due to borderline BP - give small fluid bolus to see if hydrating will improve BP and  HR    Colo-enteric fistula - healing    Hypokalemia - improved today - cont to follow  H/o Bladder cancer - on UTI currently    Code Status: full code Family Communication: family at bedside- daughter Disposition Plan: follow in SDU today  Consultants: GI  Procedures: none  Antibiotics: Azactam 11/26- 11/27 Levaquin 11/26 Vancomycin 11/26  DVT prophylaxis: therapeutic INR  HPI/Subjective: Pt alert, weak, no significant nausea, no sore throat or flu like symptoms, had a BM today but not sure if it was diarrhea (per RN it was soft stool mixed in urine). No other complaints. RN noted right leg to be red but pt not c/o pain in the leg.    Objective: Blood pressure 111/56, pulse 108, temperature 98.9 F (37.2 C), temperature source Oral, resp. rate 22, height 5\' 5"  (1.651 m), weight 75.7 kg (166 lb 14.2 oz), SpO2 96.00%.  Intake/Output Summary (Last 24 hours) at 11/08/13 1252 Last data filed at 11/08/13 0933  Gross per 24 hour  Intake    325 ml  Output    353 ml  Net    -28 ml     Exam: General: AAO x 3, No acute respiratory distress Lungs: Clear to auscultation bilaterally without wheezes or crackles Cardiovascular: Regular rate and rhythm without murmur gallop or rub normal S1 and S2 Abdomen: Nontender, nondistended, soft, bowel sounds positive, no rebound, no ascites, no appreciable mass Extremities: No significant cyanosis, clubbing, - has edema  in right leg along with redness below the knee - temperature same as in her thigh area.   Data Reviewed: Basic Metabolic Panel:  Recent Labs Lab 11/07/13 1308 11/08/13 0524  NA 136 131*  K 3.2* 3.5  CL 97 97  CO2 26 24  GLUCOSE 89 116*  BUN 12 10  CREATININE 0.59 0.71  CALCIUM 8.6 7.8*   Liver Function Tests:  Recent Labs Lab 11/07/13 1308 11/08/13 0524  AST 22 14  ALT 16 12  ALKPHOS 84 62  BILITOT 0.5 0.5  PROT 7.0 5.7*  ALBUMIN 3.8 2.8*   No results found for this basename: LIPASE, AMYLASE,   in the last 168 hours No results found for this basename: AMMONIA,  in the last 168 hours CBC:  Recent Labs Lab 11/07/13 1308 11/08/13 0524  WBC 16.9* 16.6*  NEUTROABS 14.5*  --   HGB 13.2 11.1*  HCT 38.5 32.3*  MCV 96.5 96.1  PLT 181 154   Cardiac Enzymes: No results found for this basename: CKTOTAL, CKMB, CKMBINDEX, TROPONINI,  in the last 168 hours BNP (last 3 results) No results found for this basename: PROBNP,  in the last 8760 hours CBG: No results found for this basename: GLUCAP,  in the last 168 hours  Recent Results (from the past 240 hour(s))  CULTURE, BLOOD (ROUTINE X 2)     Status: None   Collection Time    11/07/13 12:55 PM      Result Value Range Status   Specimen Description BLOOD RIGHT HAND   Final   Special Requests BOTTLES DRAWN AEROBIC ONLY   Final   Culture  Setup Time     Final   Value: 11/07/2013 18:15     Performed at [facility]   Culture     Final   Value:        BLOOD CULTURE RECEIVED NO GROWTH TO DATE CULTURE WILL BE HELD FOR 5 DAYS BEFORE ISSUING A FINAL NEGATIVE REPORT     Performed at [facility]   Report Status PENDING   Incomplete  CULTURE, BLOOD (ROUTINE X 2)     Status: None   Collection Time    11/07/13  1:10 PM      Result Value Range Status   Specimen Description BLOOD RIGHT HAND   Final   Special Requests BOTTLES DRAWN AEROBIC ONLY   Final   Culture  Setup Time     Final   Value: 11/07/2013 18:15     Performed at [facility]   Culture     Final   Value:        BLOOD CULTURE RECEIVED NO GROWTH TO DATE CULTURE WILL BE HELD FOR 5 DAYS BEFORE ISSUING A FINAL NEGATIVE REPORT     Performed at [facility]   Report Status PENDING   Incomplete  URINE CULTURE     Status: None   Collection Time    11/07/13  1:33 PM      Result Value Range Status   Specimen Description URINE, CATHETERIZED   Final   Special Requests NONE   Final   Culture  Setup Time     Final   Value: 11/07/2013 13:57      Performed at [facility] Count     Final   Value: NO GROWTH     Performed at [facility]   Culture     Final   Value: NO GROWTH  Performed at [facility]   Report Status 11/08/2013 FINAL   Final  MRSA PCR SCREENING     Status: None   Collection Time    11/07/13  5:17 PM      Result Value Range Status   MRSA by PCR NEGATIVE  NEGATIVE Final   Comment:            The GeneXpert MRSA Assay (FDA     approved for NASAL specimens     only), is one component of a     comprehensive MRSA colonization     surveillance program. It is not     intended to diagnose MRSA     infection nor to guide or     monitor treatment for     MRSA infections.     Studies:  Recent x-ray studies have been reviewed in detail by the Attending Physician  Scheduled Meds:  Scheduled Meds: . cefTRIAXone (ROCEPHIN)  IV  1 g Intravenous Q24H  . diltiazem  240 mg Oral Daily  . potassium chloride  20 mEq Oral Daily  . rosuvastatin  5 mg Oral Daily  . sodium chloride  3 mL Intravenous Q12H   Continuous Infusions: . sodium chloride 1,000 mL (11/07/13 1343)  . sodium chloride 75 mL/hr at 11/08/13 1610    Time spent on care of this patient: 35 min   Calvert Cantor, MD  [facility] Office  [phone] Pager - Text Page per Loretha Stapler as per below:  On-Call/Text Page:      [email or web address]      password [email or web address]  If 7PM-7AM, please contact night-coverage [email or web address] Password [email or web address] 11/08/2013, 12:52 PM   LOS: 1 day

## 2013-11-09 DIAGNOSIS — I4891 Unspecified atrial fibrillation: Secondary | ICD-10-CM

## 2013-11-09 DIAGNOSIS — K632 Fistula of intestine: Secondary | ICD-10-CM | POA: Diagnosis not present

## 2013-11-09 DIAGNOSIS — A419 Sepsis, unspecified organism: Secondary | ICD-10-CM | POA: Diagnosis not present

## 2013-11-09 DIAGNOSIS — L02419 Cutaneous abscess of limb, unspecified: Secondary | ICD-10-CM | POA: Diagnosis not present

## 2013-11-09 LAB — BASIC METABOLIC PANEL WITH GFR
BUN: 6 mg/dL (ref 6–23)
CO2: 25 meq/L (ref 19–32)
Calcium: 8.1 mg/dL — ABNORMAL LOW (ref 8.4–10.5)
Chloride: 102 meq/L (ref 96–112)
Creatinine, Ser: 0.56 mg/dL (ref 0.50–1.10)
GFR calc Af Amer: >90 mL/min
GFR calc non Af Amer: 80 mL/min — ABNORMAL LOW
Glucose, Bld: 115 mg/dL — ABNORMAL HIGH (ref 70–99)
Potassium: 3.4 meq/L — ABNORMAL LOW (ref 3.5–5.1)
Sodium: 136 meq/L (ref 135–145)

## 2013-11-09 LAB — CBC
HCT: 33 % — ABNORMAL LOW (ref 36.0–46.0)
Hemoglobin: 11.2 g/dL — ABNORMAL LOW (ref 12.0–15.0)
MCH: 32.3 pg (ref 26.0–34.0)
RBC: 3.47 MIL/uL — ABNORMAL LOW (ref 3.87–5.11)
RDW: 13.4 % (ref 11.5–15.5)
WBC: 8.4 10*3/uL (ref 4.0–10.5)

## 2013-11-09 MED ORDER — POTASSIUM CHLORIDE 20 MEQ/15ML (10%) PO LIQD
20.0000 meq | Freq: Every day | ORAL | Status: DC
  Administered 2013-11-10: 20 meq via ORAL
  Filled 2013-11-09: qty 15

## 2013-11-09 MED ORDER — METOPROLOL SUCCINATE ER 25 MG PO TB24
25.0000 mg | ORAL_TABLET | Freq: Every day | ORAL | Status: DC
  Administered 2013-11-09: 25 mg via ORAL
  Filled 2013-11-09 (×2): qty 1

## 2013-11-09 MED ORDER — POTASSIUM CHLORIDE CRYS ER 20 MEQ PO TBCR: 40.0000 meq | EXTENDED_RELEASE_TABLET | Freq: Once | ORAL | Status: DC

## 2013-11-09 MED ORDER — SODIUM CHLORIDE 0.9 % IV SOLN
1000.0000 mL | INTRAVENOUS | Status: DC
  Administered 2013-11-09: 1000 mL via INTRAVENOUS

## 2013-11-09 MED ORDER — POLYETHYLENE GLYCOL 3350 17 G PO PACK
17.0000 g | PACK | Freq: Every day | ORAL | Status: DC | PRN
  Administered 2013-11-09 – 2013-11-10 (×2): 17 g via ORAL
  Filled 2013-11-09: qty 1

## 2013-11-09 MED ORDER — WARFARIN SODIUM 7.5 MG PO TABS
7.5000 mg | ORAL_TABLET | Freq: Once | ORAL | Status: AC
  Administered 2013-11-09: 7.5 mg via ORAL
  Filled 2013-11-09: qty 1

## 2013-11-09 MED ORDER — POLYETHYLENE GLYCOL 3350 17 G PO PACK
17.0000 g | PACK | Freq: Two times a day (BID) | ORAL | Status: DC
  Filled 2013-11-09 (×2): qty 1

## 2013-11-09 MED ORDER — POTASSIUM CHLORIDE 20 MEQ/15ML (10%) PO LIQD: 40.0000 meq | Freq: Every day | ORAL | Status: DC

## 2013-11-09 NOTE — Progress Notes (Signed)
ANTICOAGULATION CONSULT NOTE - Follow Up Consult  Pharmacy Consult for Coumadin Indication: atrial fibrillation  Allergies  Allergen Reactions  . Contrast Media [Iodinated Diagnostic Agents] Hives  . Levaquin [Levofloxacin] Itching and Rash  . Penicillins Rash    "haven't had it in years"  . Vancomycin Itching and Rash    Patient Measurements: Height: 5\' 5"  (165.1 cm) Weight: 166 lb 14.2 oz (75.7 kg) IBW/kg (Calculated) : 57  Vital Signs: Temp: 98.3 F (36.8 C) (11/28 0649) Temp src: Oral (11/28 0649) BP: 137/52 mmHg (11/28 0649) Pulse Rate: 90 (11/28 0649)  Labs:  Recent Labs  11/07/13 1308 11/08/13 0524 11/09/13 0440  HGB 13.2 11.1* 11.2*  HCT 38.5 32.3* 33.0*  PLT 181 154 147*  LABPROT 23.7* 23.4* 22.3*  INR 2.20* 2.16* 2.03*  CREATININE 0.59 0.71 0.56    Estimated Creatinine Clearance: 47.6 ml/min (by C-G formula based on Cr of 0.56).  Assessment: 89yof continues on coumadin for afib. INR is therapeutic on the low end. CBC fairly stable. No bleeding reported.  Goal of Therapy:  INR 2-3 Monitor platelets by anticoagulation protocol: Yes   Plan:  1) Coumadin 7.5mg  x 1 tonight then can probably resume home dose tomorrow 2) INR in AM  Fredrik Rigger 11/09/2013,10:07 AM

## 2013-11-09 NOTE — Evaluation (Signed)
Physical Therapy Evaluation Patient Details Name: [name] MRN: [id number] DOB: 09/03/1921 Today's Date: 11/09/2013 Time: 1005-1040 PT Time Calculation (min): 35 min  PT Assessment / Plan / Recommendation History of Present Illness  [name] is a 79 y.o. female, known history of chronic hematuria + grade 1 transitional cell bladder CA status post BCG treatment at [facility]. TURBT 10/2011, diverticulosis plus adenomatous polyps, chronic atrial fibrillation, history of fall 2005 with small subarachnoid.  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   Clinical Impression  Pt admitted with the above. Pt currently with functional limitations due to the deficits listed below (see PT Problem List). Pt moving well however reports "still shaking".  Will continue to follow and determine the need for HHPT.  Pt will benefit from skilled PT to increase their independence and safety with mobility to allow discharge to the venue listed below.      PT Assessment  Patient needs continued PT services    Follow Up Recommendations  Home health PT;Supervision - Intermittent (depending on progress pt may not need HHPT)    Equipment Recommendations  None recommended by PT    Frequency Min 3X/week    Precautions / Restrictions Precautions Precautions: Fall   Pertinent Vitals/Pain C/o chronic right knee pain but does not rate      Mobility  Bed Mobility Bed Mobility: Not assessed (Pt sitting in recliner when entering. ) Transfers Transfers: Sit to Stand;Stand to Sit Sit to Stand: 4: Min assist;4: Min guard;From chair/3-in-1;From toilet Stand to Sit: 4: Min guard;4: Min assist;To chair/3-in-1;To toilet Details for Transfer Assistance: (A) to initiate transfer from lower surface and max cues for hand placement Ambulation/Gait Ambulation/Gait Assistance: 4: Min guard Ambulation Distance (Feet): 200 Feet Assistive device: Rolling  walker Ambulation/Gait Assistance Details: Minguard for safety with cues for RW placement Gait Pattern: Step-through pattern;Decreased stride length;Shuffle;Trunk flexed Gait velocity: decreased General Gait Details: Pt with noticeable tremors with activity.  Pt stated "I wish I knew why I was shaking." Stairs: No    Exercises     PT Diagnosis: Difficulty walking;Generalized weakness  PT Problem List: Decreased strength;Decreased activity tolerance;Decreased balance;Decreased mobility;Decreased knowledge of use of DME;Pain;Cardiopulmonary status limiting activity PT Treatment Interventions: DME instruction;Gait training;Functional mobility training;Therapeutic activities;Therapeutic exercise;Balance training;Patient/family education     PT Goals(Current goals can be found in the care plan section) Acute Rehab PT Goals Patient Stated Goal: to feel better and go home PT Goal Formulation: With patient Time For Goal Achievement: 11/16/13 Potential to Achieve Goals: Good  Visit Information  Last PT Received On: 11/09/13 Assistance Needed: +1 History of Present Illness: [name] is a 79 y.o. female, known history of chronic hematuria + grade 1 transitional cell bladder CA status post BCG treatment at [facility]. TURBT 10/2011, diverticulosis plus adenomatous polyps, chronic atrial fibrillation, history of fall 2005 with small subarachnoid.  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        Prior Functioning  Home Living Family/patient expects to be discharged to:: Private residence Living Arrangements: Children Available Help at Discharge: Family Type of Home: Mobile home Home Access: Ramped entrance Home Layout: One level Home Equipment: Emergency planning/management officer - 2 wheels;Cane - single point Prior Function Level of Independence: Independent;Independent with assistive device(s) Comments: Pt ambulates with SPC in community.  Pt continues to cook  and cleaning.  Pt still drives.  Communication Communication: No difficulties Dominant Hand: Right  Cognition  Cognition Arousal/Alertness: Awake/alert Behavior During Therapy: WFL for tasks assessed/performed Overall Cognitive Status: Within Functional Limits for tasks assessed    Extremity/Trunk Assessment Lower Extremity Assessment Lower Extremity Assessment: Generalized weakness   Balance Balance Balance Assessed: Yes Static Standing Balance Static Standing - Balance Support: No upper extremity supported;During functional activity Static Standing - Level of Assistance: 5: Stand by assistance Static Standing - Comment/# of Minutes: Minguard for safety due to pt with impulsive movements at times  End of Session PT - End of Session Equipment Utilized During Treatment: Gait belt Activity Tolerance: Patient tolerated treatment well Patient left: in chair;with call bell/phone within reach Nurse Communication: Mobility status  GP     , 11/09/2013, 10:59 AM  Jake Shark, PT DPT [phone]

## 2013-11-09 NOTE — Progress Notes (Signed)
Utilization review completed.  

## 2013-11-09 NOTE — Progress Notes (Addendum)
[facility] Progress Note [place] TEAM 1 - Stepdown/ICU TEAM   [name] JYN:[id number] DOB: 1921/05/09 DOA: 11/07/2013 PCP: Pearson Grippe, MD  Brief narrative: This is a 79 year old female with a past medical history of grade 1 transitional cell bladder cancer status post transurethral resection of her bladder tumor and BCG treatment, chronic A. 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   Pt presented for vomiting, fever (103 in ER) and hypotension after eating out. See H and P please   Per daughter, Dr Juanda Chance performed a CT scan a couple of months ago and was told that the fistula appear to be 95% healed. Regardless of this, they feel that the fistula could have been the cause of the current symptoms. Therefore, a CT of the abd was performed in the ER. This revealed the fistula to be unchanged from the prior films.   She was however, later found to have erythema of her right leg and at this point I am suspecting she has a viral gastroenteritis along with a right leg cellulitis resulting in current symptoms.    Assessment/Plan: Principal Problem:   Sepsis/ lactic acidosis-  Normal procalcitonin is re-assuring  Leukocytosis resolved  (a) gastroenteritis- likely viral- resolved, advance to full liquids- C diff negative- left abd pain/ fullness (chronic?) still present  (b) cellulitis of right leg? - she has chronic edema of the right leg due to a knee injury and subsequent DVT but, per pt and family, it is more red than usual therefore, will cont Rocephin for this for now- appears to be improving - f/u on blood cx- elevate leg as much as possible  Active Problems:  Hypotension- sepsis related - hold diuretics-  -BP improved  - will cut fluids back to 50 cc/hr.     DVT right leg / A-fib - cont Coumadin- watch INR due to antibiotics    Chronic atrial fibrillation - HR stable still - add back Toprol at 25 mg (half dose)    Colo-enteric fistula - healing  appropriately and not a cause of current symptoms - will resume home dose Miralax (PRN) today    Hypokalemia - cont to replace as needed  H/o Bladder cancer  Left upper back pain - muscular in nature- PRN Voltaren gel    Code Status: full code Family Communication: - daughter on 11/27 Disposition Plan: transfer to tele- Pt eval  Consultants: GI  Procedures: none  Antibiotics: Rocephin 11/27 >> Azactam 11/26- 11/27 Levaquin 11/26 Vancomycin 11/26  DVT prophylaxis: therapeutic INR  HPI/Subjective: Pt sitting up in a chair. Seems reluctant to have diet advance but willing to try. Having continued tenderness/ fullness in left abdomen and feels that having a BM will resolve it. Had left sided back pain yesterday and this morning but this has resolved since being out of bed.    Objective: Blood pressure 137/52, pulse 90, temperature 98.3 F (36.8 C), temperature source Oral, resp. rate 24, height 5\' 5"  (1.651 m), weight 75.7 kg (166 lb 14.2 oz), SpO2 94.00%.  Intake/Output Summary (Last 24 hours) at 11/09/13 1003 Last data filed at 11/09/13 0600  Gross per 24 hour  Intake 2488.75 ml  Output   3225 ml  Net -736.25 ml     Exam: General: AAO x 3, No acute respiratory distress Lungs: Clear to auscultation bilaterally without wheezes or crackles Cardiovascular: Regular rate and rhythm without murmur gallop or rub normal S1 and S2 Abdomen: subjective tenderness when palpating left mid abdomen, nondistended, soft, bowel  sounds positive, no rebound, no ascites, no appreciable mass Extremities: No significant cyanosis, clubbing, - has edema (improving) in right leg along with redness below the knee - temperature same as in her thigh area.  Back- mild tenderness over left rib cage which is noted with aggressive palpation only.   Data Reviewed: Basic Metabolic Panel:  Recent Labs Lab 11/07/13 1308 11/08/13 0524 11/09/13 0440  NA 136 131* 136  K 3.2* 3.5 3.4*  CL 97  97 102  CO2 26 24 25   GLUCOSE 89 116* 115*  BUN 12 10 6   CREATININE 0.59 0.71 0.56  CALCIUM 8.6 7.8* 8.1*   Liver Function Tests:  Recent Labs Lab 11/07/13 1308 11/08/13 0524  AST 22 14  ALT 16 12  ALKPHOS 84 62  BILITOT 0.5 0.5  PROT 7.0 5.7*  ALBUMIN 3.8 2.8*   No results found for this basename: LIPASE, AMYLASE,  in the last 168 hours No results found for this basename: AMMONIA,  in the last 168 hours CBC:  Recent Labs Lab 11/07/13 1308 11/08/13 0524 11/09/13 0440  WBC 16.9* 16.6* 8.4  NEUTROABS 14.5*  --   --   HGB 13.2 11.1* 11.2*  HCT 38.5 32.3* 33.0*  MCV 96.5 96.1 95.1  PLT 181 154 147*   Cardiac Enzymes: No results found for this basename: CKTOTAL, CKMB, CKMBINDEX, TROPONINI,  in the last 168 hours BNP (last 3 results) No results found for this basename: PROBNP,  in the last 8760 hours CBG: No results found for this basename: GLUCAP,  in the last 168 hours  Recent Results (from the past 240 hour(s))  CULTURE, BLOOD (ROUTINE X 2)     Status: None   Collection Time    11/07/13 12:55 PM      Result Value Range Status   Specimen Description BLOOD RIGHT HAND   Final   Special Requests BOTTLES DRAWN AEROBIC ONLY   Final   Culture  Setup Time     Final   Value: 11/07/2013 18:15     Performed at [facility]   Culture     Final   Value:        BLOOD CULTURE RECEIVED NO GROWTH TO DATE CULTURE WILL BE HELD FOR 5 DAYS BEFORE ISSUING A FINAL NEGATIVE REPORT     Performed at [facility]   Report Status PENDING   Incomplete  CULTURE, BLOOD (ROUTINE X 2)     Status: None   Collection Time    11/07/13  1:10 PM      Result Value Range Status   Specimen Description BLOOD RIGHT HAND   Final   Special Requests BOTTLES DRAWN AEROBIC ONLY   Final   Culture  Setup Time     Final   Value: 11/07/2013 18:15     Performed at [facility]   Culture     Final   Value:        BLOOD CULTURE RECEIVED NO GROWTH TO DATE CULTURE WILL BE HELD  FOR 5 DAYS BEFORE ISSUING A FINAL NEGATIVE REPORT     Performed at [facility]   Report Status PENDING   Incomplete  URINE CULTURE     Status: None   Collection Time    11/07/13  1:33 PM      Result Value Range Status   Specimen Description URINE, CATHETERIZED   Final   Special Requests NONE   Final   Culture  Setup Time  Final   Value: 11/07/2013 13:57     Performed at [facility] Count     Final   Value: NO GROWTH     Performed at [facility]   Culture     Final   Value: NO GROWTH     Performed at [facility]   Report Status 11/08/2013 FINAL   Final  MRSA PCR SCREENING     Status: None   Collection Time    11/07/13  5:17 PM      Result Value Range Status   MRSA by PCR NEGATIVE  NEGATIVE Final   Comment:            The GeneXpert MRSA Assay (FDA     approved for NASAL specimens     only), is one component of a     comprehensive MRSA colonization     surveillance program. It is not     intended to diagnose MRSA     infection nor to guide or     monitor treatment for     MRSA infections.  CLOSTRIDIUM DIFFICILE BY PCR     Status: None   Collection Time    11/08/13  1:23 PM      Result Value Range Status   C difficile by pcr NEGATIVE  NEGATIVE Final     Studies:  Recent x-ray studies have been reviewed in detail by the Attending Physician  Scheduled Meds:  Scheduled Meds: . cefTRIAXone (ROCEPHIN)  IV  1 g Intravenous Q24H  . diltiazem  240 mg Oral Daily  . metoprolol succinate  25 mg Oral QHS  . polyethylene glycol  17 g Oral BID  . potassium chloride  20 mEq Oral Daily  . rosuvastatin  5 mg Oral Daily  . sodium chloride  3 mL Intravenous Q12H  . Warfarin - Pharmacist Dosing Inpatient   Does not apply q1800   Continuous Infusions: . sodium chloride 1,000 mL (11/07/13 1343)  . sodium chloride 1,000 mL (11/08/13 2243)    Time spent on care of this patient: 35 min   ,, MD  [facility] Office   [phone] Pager - Text Page per Loretha Stapler as per below:  On-Call/Text Page:      [email or web address]      password [email or web address]  If 7PM-7AM, please contact night-coverage [email or web address] Password [email or web address] 11/09/2013, 10:03 AM   LOS: 2 days

## 2013-11-10 DIAGNOSIS — B3781 Candidal esophagitis: Secondary | ICD-10-CM | POA: Diagnosis not present

## 2013-11-10 DIAGNOSIS — C679 Malignant neoplasm of bladder, unspecified: Secondary | ICD-10-CM | POA: Diagnosis not present

## 2013-11-10 DIAGNOSIS — J40 Bronchitis, not specified as acute or chronic: Secondary | ICD-10-CM

## 2013-11-10 DIAGNOSIS — A419 Sepsis, unspecified organism: Secondary | ICD-10-CM | POA: Diagnosis not present

## 2013-11-10 DIAGNOSIS — R609 Edema, unspecified: Secondary | ICD-10-CM

## 2013-11-10 HISTORY — PX: OTHER SURGICAL HISTORY: SHX169

## 2013-11-10 LAB — BASIC METABOLIC PANEL
BUN: 3 mg/dL — ABNORMAL LOW (ref 6–23)
Calcium: 8.6 mg/dL (ref 8.4–10.5)
Creatinine, Ser: 0.51 mg/dL (ref 0.50–1.10)
GFR calc Af Amer: >90 mL/min (ref >90)
GFR calc non Af Amer: 82 mL/min — ABNORMAL LOW (ref >90)
Glucose, Bld: 123 mg/dL — ABNORMAL HIGH (ref 70–99)
Potassium: 3.8 mEq/L (ref 3.5–5.1)
Sodium: 137 mEq/L (ref 135–145)

## 2013-11-10 LAB — CBC
MCH: 32.9 pg (ref 26.0–34.0)
MCHC: 34.9 g/dL (ref 30.0–36.0)
MCV: 94.1 fL (ref 78.0–100.0)
Platelets: 165 10*3/uL (ref 150–400)
RBC: 3.56 MIL/uL — ABNORMAL LOW (ref 3.87–5.11)

## 2013-11-10 LAB — PROTIME-INR
INR: 2.5 — ABNORMAL HIGH (ref 0.00–1.49)
Prothrombin Time: 26.2 seconds — ABNORMAL HIGH (ref 11.6–15.2)

## 2013-11-10 MED ORDER — CEPHALEXIN 500 MG PO CAPS: 500.0000 mg | ORAL_CAPSULE | Freq: Three times a day (TID) | ORAL | Status: DC

## 2013-11-10 MED ORDER — SACCHAROMYCES BOULARDII 250 MG PO CAPS: 250.0000 mg | ORAL_CAPSULE | Freq: Two times a day (BID) | ORAL | Status: DC

## 2013-11-10 MED ORDER — PROMETHAZINE HCL 12.5 MG PO TABS: 12.5000 mg | ORAL_TABLET | Freq: Four times a day (QID) | ORAL | Status: DC | PRN

## 2013-11-10 MED ORDER — WARFARIN SODIUM 5 MG PO TABS
5.0000 mg | ORAL_TABLET | Freq: Once | ORAL | Status: DC
  Filled 2013-11-10: qty 1

## 2013-11-10 NOTE — Discharge Summary (Signed)
Physician Discharge Summary  Patient ID: [name] MRN: [id number] DOB/AGE: [birth year] 79 y.o.  Admit date: 11/07/2013 Discharge date: 11/10/2013  Primary Care Physician:  Pearson Grippe, MD  Final Discharge Diagnoses:    . Sepsis- resolved   Secondary diagnoses  . DVT (deep venous thrombosis), right . Dyslipidemia . Chronic atrial fibrillation . GASTRITIS, CHRONIC . Rheumatoid arthritis(714.0) . Cellulitis of leg, right . Viral gastroenteritis- resolved  Consults: Gastroenterology, Dr. Russella Dar   Recommendations for Outpatient Follow-up:  1. currently diuretics are held as patient is recovering from gastroenteritis.  Allergies:   Allergies  Allergen Reactions  . Contrast Media [Iodinated Diagnostic Agents] Hives  . Levaquin [Levofloxacin] Itching and Rash  . Penicillins Rash    "haven't had it in years"  . Vancomycin Itching and Rash     Discharge Medications:   Medication List    STOP taking these medications       fish oil-omega-3 fatty acids 1000 MG capsule     furosemide 40 MG tablet  Commonly known as:  LASIX     spironolactone 25 MG tablet  Commonly known as:  ALDACTONE      TAKE these medications       acetaminophen 500 MG tablet  Commonly known as:  TYLENOL  Take 1,000 mg by mouth every 6 (six) hours as needed for pain.     calcium carbonate 1250 MG tablet  Commonly known as:  OS-CAL - dosed in mg of elemental calcium  Take 1 tablet by mouth daily.     cephALEXin 500 MG capsule  Commonly known as:  KEFLEX  Take 1 capsule (500 mg total) by mouth 3 (three) times daily. X 5 days     cholecalciferol 1000 UNITS tablet  Commonly known as:  VITAMIN D  Take 1,000 Units by mouth daily.     Cinnamon 500 MG capsule  Take 1,000 mg by mouth daily.     diltiazem 240 MG 24 hr capsule  Commonly known as:  CARDIZEM CD  Take 240 mg by mouth daily.     guaiFENesin 600 MG 12 hr tablet  Commonly known as:  MUCINEX  Take 600 mg by mouth 2 (two)  times daily as needed for congestion.     metoprolol succinate 50 MG 24 hr tablet  Commonly known as:  TOPROL-XL  Take 50 mg by mouth at bedtime.     multivitamins ther. w/minerals Tabs tablet  Take 1 tablet by mouth daily.     PRILOSEC 20 MG capsule  Generic drug:  omeprazole  Take 20 mg by mouth 2 (two) times daily.     promethazine 12.5 MG tablet  Commonly known as:  PHENERGAN  Take 1 tablet (12.5 mg total) by mouth every 6 (six) hours as needed for nausea.     psyllium 58.6 % powder  Commonly known as:  METAMUCIL  Take 1 packet by mouth 2 (two) times daily.     rosuvastatin 5 MG tablet  Commonly known as:  CRESTOR  Take 5 mg by mouth daily.     saccharomyces boulardii 250 MG capsule  Commonly known as:  FLORASTOR  Take 1 capsule (250 mg total) by mouth 2 (two) times daily.     warfarin 5 MG tablet  Commonly known as:  COUMADIN  Take 5 mg by mouth daily. Take 7.5mg  by mouth on Thurs, take 5mg  every other day         Brief H and P: For complete details please refer  to admission H and P, but in brief patient is a 79 year old female with history of chronic hematuria, chronic atrial fibrillation, who had gone out with her family to restaurant a day before the admission. Subsequently she was at adult daycare in the tract of the facility brought the patient back to her home. Patient was having shaking chills BP was 96/72 which dropped further down to 88/70 by EMS. Patient had 2 episodes of vomiting. Patient was admitted to step down unit for further workup.  Hospital Course:   This is a 79 year old female with a past medical history of grade 1 transitional cell bladder cancer status post transurethral resection of her bladder tumor and BCG treatment, chronic A. 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 Patient presented with vomiting, fever (103 in ER) and hypotension after eating out. Per daughter, Dr Juanda Chance performed a CT scan a couple of months ago and was told that  the fistula appear to be 95% healed. Regardless of this, they feel that the fistula could have been the cause of the current symptoms. Therefore, a CT of the abd was performed in the ER. This revealed the fistula to be unchanged from the prior films.  She was however, later found to have right leg cellulitis . She was admitted for further workup  Sepsis/ lactic acidosis- improved, Normal procalcitonin is re-assuring. Leukocytosis has resolved. Likely precipitated due to  (a) gastroenteritis- likely viral- resolved, patient is now tolerating solid diet. C. difficile was negative.  (b) cellulitis of right leg? - she has chronic edema of the right leg due to a knee injury and subsequent DVT. Patient was placed on Rocephin. Per patient this has significantly improved. Blood cultures and urine culture has been negative so for. DC on Keflex for a week.  Hypotension- sepsis related: Diuretics has been held. BP has been improving. Patient was recommended to hold the Lasix and spironolactone until she has seen her primary care physician in followup and if recommended by PCP.   DVT right leg / A-fib - cont Coumadin- watch INR due to antibiotics. Patient is on Coumadin regardless due to atrial fibrillation. Repeat bilateral Doppler ultrasound was obtained was negative for any DVT bilaterally.   Chronic atrial fibrillation currently rate controlled, continue Cardizem and beta blocker for now. Patient is on Coumadin.  Colo-enteric fistula - healing appropriately and not a cause of current symptoms. Patient recommended to follow with Dr. Juanda Chance outpatient   Hypokalemia  - cont to replace as needed   H/o Bladder cancer  Left upper back pain  - muscular in nature- PRN Voltaren gel  Day of Discharge BP 127/70  Pulse 92  Temp(Src) 97.9 F (36.6 C) (Oral)  Resp 19  Ht 5\' 5"  (1.651 m)  Wt 75.7 kg (166 lb 14.2 oz)  BMI 27.77 kg/m2  SpO2 93%  Physical Exam: General: Alert and awake oriented x3 not in  any acute distress. CVS: S1-S2 clear no murmur rubs or gallops Chest: clear to auscultation bilaterally, no wheezing rales or rhonchi Abdomen: soft nontender, nondistended, normal bowel sounds Extremities: no cyanosis, clubbing. 1+ edema in her right leg, no edema left leg, eczema improving Neuro: Cranial nerves II-XII intact, no focal neurological deficits   The results of significant diagnostics from this hospitalization (including imaging, microbiology, ancillary and laboratory) are listed below for reference.    LAB RESULTS: Basic Metabolic Panel:  Recent Labs Lab 11/09/13 0440 11/10/13 0601  NA 136 137  K 3.4* 3.8  CL 102 102  CO2 25 25  GLUCOSE 115* 123*  BUN 6 3*  CREATININE 0.56 0.51  CALCIUM 8.1* 8.6   Liver Function Tests:  Recent Labs Lab 11/07/13 1308 11/08/13 0524  AST 22 14  ALT 16 12  ALKPHOS 84 62  BILITOT 0.5 0.5  PROT 7.0 5.7*  ALBUMIN 3.8 2.8*   No results found for this basename: LIPASE, AMYLASE,  in the last 168 hours No results found for this basename: AMMONIA,  in the last 168 hours CBC:  Recent Labs Lab 11/07/13 1308  11/09/13 0440 11/10/13 0601  WBC 16.9*  < > 8.4 6.1  NEUTROABS 14.5*  --   --   --   HGB 13.2  < > 11.2* 11.7*  HCT 38.5  < > 33.0* 33.5*  MCV 96.5  < > 95.1 94.1  PLT 181  < > 147* 165  < > = values in this interval not displayed. Cardiac Enzymes: No results found for this basename: CKTOTAL, CKMB, CKMBINDEX, TROPONINI,  in the last 168 hours BNP: No components found with this basename: POCBNP,  CBG: No results found for this basename: GLUCAP,  in the last 168 hours  Significant Diagnostic Studies:  Ct Abdomen Pelvis Wo Contrast  11/07/2013   CLINICAL DATA:  Coloenteric fistula.  EXAM: CT ABDOMEN AND PELVIS WITHOUT CONTRAST  TECHNIQUE: Multidetector CT imaging of the abdomen and pelvis was performed following the standard protocol without intravenous contrast.  COMPARISON:  October 19, 2013.  FINDINGS: Visualized  lung bases appear normal. These unenhanced images demonstrate no focal abnormality of on the liver, spleen or pancreas. No gallstones are noted. Adrenal glands appear normal. No hydronephrosis or renal obstruction is noted. No renal or ureteral calculi are noted. Atherosclerotic calcifications of abdominal aorta and iliac arteries are noted. Stable 3.2 cm aneurysmal dilatation of proximal infrarenal abdominal aorta is noted. Stable 6.5 cm cyst is seen arising from the left kidney. No evidence of bowel obstruction is noted. Extensive sigmoid diverticulosis is noted without inflammation. There appears to be no significant change involving the small fistula noted on prior CT scans arising from the proximal sigmoid colon best seen on image number 65 of series 5. Urinary bladder appears normal. No abnormal fluid collection is noted.  IMPRESSION: Stable 3.2 cm infrarenal abdominal aortic aneurysm.  Stable 6.5 cm left renal cyst.  Stable sigmoid diverticulosis without inflammation.  No change is seen involving the residual fistula arising from the proximal sigmoid colon compared to prior exam. This fistula is not seen to definitively fill with contrast, and there is no surrounding inflammation. No abscess or abnormal fluid collection is noted on this study.   Electronically Signed   By: Roque Lias M.D.   On: 11/07/2013 19:42   Dg Chest Port 1 View  11/07/2013   CLINICAL DATA:  Weakness and fever  EXAM: PORTABLE CHEST - 1 VIEW  COMPARISON:  02/07/2013  FINDINGS: The cardiac shadow is stable. Mild interstitial changes are again noted bilaterally without focal infiltrate or sizable effusion no acute bony abnormality is seen.  IMPRESSION: Chronic changes without acute abnormality.   Electronically Signed   By: Alcide Clever M.D.   On: 11/07/2013 12:40    2D ECHO:   Disposition and Follow-up:     Discharge Orders   Future Orders Complete By Expires   Diet - low sodium heart healthy  As directed    Discharge  instructions  As directed    Comments:     1) Please HOLD lasix  and spironolactone until your hospital follow-up with your doctor, Dr Selena Batten. If your BP is stable and doing well, you may be restarted on them by your primary doctor.  2) Florastor is a pro-biotic while you are taking antibiotic   Increase activity slowly  As directed        DISPOSITION: Home DIET: Heart healthy diet  DISCHARGE FOLLOW-UP Follow-up Information   Follow up with Pearson Grippe, MD. Schedule an appointment as soon as possible for a visit in 10 days. (for hospital follow-up)    Specialty:  Internal Medicine   Contact information:   [address] [place] [address] [phone]       Follow up with Lina Sar, MD. Schedule an appointment as soon as possible for a visit in 2 weeks. (for the follow-up of colo-enteric fistula)    Specialty:  Gastroenterology   Contact information:   [address] [place] [address] [phone]       Time spent on Discharge: 45 mins  Signed:   RAI,RIPUDEEP M.D. [facility] 11/10/2013, 11:13 AM Pager: [phone]

## 2013-11-10 NOTE — Progress Notes (Signed)
Right lower extremity venous duplex completed.  Right:  No evidence of DVT, superficial thrombosis, or Baker's cyst.  Left:  Negative for DVT in the common femoral vein.  

## 2013-11-10 NOTE — Progress Notes (Signed)
   CARE MANAGEMENT NOTE 11/10/2013  Patient:  [name], [name]   Account Number:  [id number]  Date Initiated:  11/09/2013  Documentation initiated by:  Donn Pierini  Subjective/Objective Assessment:   Pt admitted with sepsis and afib     Action/Plan:   PTA pt lived at home with family- NCM to follow for d/c needs and planning   Anticipated DC Date:  11/12/2013   Anticipated DC Plan:  HOME W HOME HEALTH SERVICES      DC Planning Services  CM consult      [facility] Choice  HOME HEALTH   Choice offered to / List presented to:  C-1 Patient        HH arranged  HH-1 RN  HH-2 PT  HH-3 OT      [facility] agency  [facility]   Status of service:  Completed, signed off Medicare Important Message given?   (If response is "NO", the following Medicare IM given date fields will be blank) Date Medicare IM given:   Date Additional Medicare IM given:    Discharge Disposition:  HOME W HOME HEALTH SERVICES  Per UR Regulation:  Reviewed for med. necessity/level of care/duration of stay  If discussed at Long Length of Stay Meetings, dates discussed:    Comments:  11/10/13 13:00 CM spoke with pt and pt's family in room for choice.  Pt chooses [facility] for HHPT/OT/RN.  Address and contact numbers verified. Referral faxed to [facility].   No other CM needs were communicated.  Freddy Jaksch, BSN, CM [phone].

## 2013-11-10 NOTE — Progress Notes (Signed)
ANTICOAGULATION CONSULT NOTE - Follow Up Consult  Pharmacy Consult for Coumadin Indication: atrial fibrillation  Allergies  Allergen Reactions  . Contrast Media [Iodinated Diagnostic Agents] Hives  . Levaquin [Levofloxacin] Itching and Rash  . Penicillins Rash    "haven't had it in years"  . Vancomycin Itching and Rash    Patient Measurements: Height: 5\' 5"  (165.1 cm) Weight: 166 lb 14.2 oz (75.7 kg) IBW/kg (Calculated) : 57  Vital Signs: Temp: 97.9 F (36.6 C) (11/29 0358) Temp src: Oral (11/29 0358) BP: 127/70 mmHg (11/29 0358) Pulse Rate: 92 (11/29 0358)  Labs:  Recent Labs  11/08/13 0524 11/09/13 0440 11/10/13 0601  HGB 11.1* 11.2* 11.7*  HCT 32.3* 33.0* 33.5*  PLT 154 147* 165  LABPROT 23.4* 22.3* 26.2*  INR 2.16* 2.03* 2.50*  CREATININE 0.71 0.56 0.51    Estimated Creatinine Clearance: 47.6 ml/min (by C-G formula based on Cr of 0.51).  Assessment: 89yof continues on coumadin for afib. INR is therapeutic. CBC fairly stable. No bleeding reported.  Goal of Therapy:  INR 2-3 Monitor platelets by anticoagulation protocol: Yes   Plan:  1) Coumadin 5mg  x 1 tonight  2) INR in AM  Thank you, Piedad Climes, PharmD Clinical Pharmacist - Resident Pager: [phone] Pharmacy: [phone] 11/10/2013 10:29 AM

## 2013-11-10 NOTE — Progress Notes (Signed)
D/c instructions were reviewed with pt and her daughter. Copy of instructions and scripts given to pt. Pt took a shower with her daughters assist prior to discharge. Pt d/c'd with her belongings with her family via wheelchair, escorted by unit NT.

## 2013-11-12 DIAGNOSIS — Z86718 Personal history of other venous thrombosis and embolism: Secondary | ICD-10-CM | POA: Diagnosis not present

## 2013-11-12 DIAGNOSIS — R609 Edema, unspecified: Secondary | ICD-10-CM | POA: Diagnosis not present

## 2013-11-12 DIAGNOSIS — I4891 Unspecified atrial fibrillation: Secondary | ICD-10-CM | POA: Diagnosis not present

## 2013-11-12 DIAGNOSIS — I10 Essential (primary) hypertension: Secondary | ICD-10-CM | POA: Diagnosis not present

## 2013-11-12 DIAGNOSIS — I739 Peripheral vascular disease, unspecified: Secondary | ICD-10-CM | POA: Diagnosis not present

## 2013-11-12 DIAGNOSIS — E785 Hyperlipidemia, unspecified: Secondary | ICD-10-CM | POA: Diagnosis not present

## 2013-11-13 DIAGNOSIS — Z86718 Personal history of other venous thrombosis and embolism: Secondary | ICD-10-CM | POA: Diagnosis not present

## 2013-11-13 DIAGNOSIS — E785 Hyperlipidemia, unspecified: Secondary | ICD-10-CM | POA: Diagnosis not present

## 2013-11-13 DIAGNOSIS — I4891 Unspecified atrial fibrillation: Secondary | ICD-10-CM | POA: Diagnosis not present

## 2013-11-13 DIAGNOSIS — I739 Peripheral vascular disease, unspecified: Secondary | ICD-10-CM | POA: Diagnosis not present

## 2013-11-13 DIAGNOSIS — A419 Sepsis, unspecified organism: Secondary | ICD-10-CM | POA: Diagnosis not present

## 2013-11-13 DIAGNOSIS — R609 Edema, unspecified: Secondary | ICD-10-CM | POA: Diagnosis not present

## 2013-11-13 DIAGNOSIS — I10 Essential (primary) hypertension: Secondary | ICD-10-CM | POA: Diagnosis not present

## 2013-11-13 LAB — CULTURE, BLOOD (ROUTINE X 2)
Culture: NO GROWTH
Culture: NO GROWTH

## 2013-11-15 DIAGNOSIS — I4891 Unspecified atrial fibrillation: Secondary | ICD-10-CM | POA: Diagnosis not present

## 2013-11-15 DIAGNOSIS — E785 Hyperlipidemia, unspecified: Secondary | ICD-10-CM | POA: Diagnosis not present

## 2013-11-15 DIAGNOSIS — Z86718 Personal history of other venous thrombosis and embolism: Secondary | ICD-10-CM | POA: Diagnosis not present

## 2013-11-15 DIAGNOSIS — I739 Peripheral vascular disease, unspecified: Secondary | ICD-10-CM | POA: Diagnosis not present

## 2013-11-15 DIAGNOSIS — R609 Edema, unspecified: Secondary | ICD-10-CM | POA: Diagnosis not present

## 2013-11-15 DIAGNOSIS — I10 Essential (primary) hypertension: Secondary | ICD-10-CM | POA: Diagnosis not present

## 2013-11-16 DIAGNOSIS — R609 Edema, unspecified: Secondary | ICD-10-CM | POA: Diagnosis not present

## 2013-11-16 DIAGNOSIS — I4891 Unspecified atrial fibrillation: Secondary | ICD-10-CM | POA: Diagnosis not present

## 2013-11-16 DIAGNOSIS — Z86718 Personal history of other venous thrombosis and embolism: Secondary | ICD-10-CM | POA: Diagnosis not present

## 2013-11-16 DIAGNOSIS — I10 Essential (primary) hypertension: Secondary | ICD-10-CM | POA: Diagnosis not present

## 2013-11-16 DIAGNOSIS — E785 Hyperlipidemia, unspecified: Secondary | ICD-10-CM | POA: Diagnosis not present

## 2013-11-16 DIAGNOSIS — I739 Peripheral vascular disease, unspecified: Secondary | ICD-10-CM | POA: Diagnosis not present

## 2013-11-20 DIAGNOSIS — I739 Peripheral vascular disease, unspecified: Secondary | ICD-10-CM | POA: Diagnosis not present

## 2013-11-20 DIAGNOSIS — E785 Hyperlipidemia, unspecified: Secondary | ICD-10-CM | POA: Diagnosis not present

## 2013-11-20 DIAGNOSIS — I10 Essential (primary) hypertension: Secondary | ICD-10-CM | POA: Diagnosis not present

## 2013-11-20 DIAGNOSIS — I4891 Unspecified atrial fibrillation: Secondary | ICD-10-CM | POA: Diagnosis not present

## 2013-11-20 DIAGNOSIS — Z86718 Personal history of other venous thrombosis and embolism: Secondary | ICD-10-CM | POA: Diagnosis not present

## 2013-11-20 DIAGNOSIS — R609 Edema, unspecified: Secondary | ICD-10-CM | POA: Diagnosis not present

## 2013-11-21 DIAGNOSIS — I739 Peripheral vascular disease, unspecified: Secondary | ICD-10-CM | POA: Diagnosis not present

## 2013-11-21 DIAGNOSIS — Z86718 Personal history of other venous thrombosis and embolism: Secondary | ICD-10-CM | POA: Diagnosis not present

## 2013-11-21 DIAGNOSIS — I10 Essential (primary) hypertension: Secondary | ICD-10-CM | POA: Diagnosis not present

## 2013-11-21 DIAGNOSIS — R609 Edema, unspecified: Secondary | ICD-10-CM | POA: Diagnosis not present

## 2013-11-21 DIAGNOSIS — E785 Hyperlipidemia, unspecified: Secondary | ICD-10-CM | POA: Diagnosis not present

## 2013-11-21 DIAGNOSIS — I4891 Unspecified atrial fibrillation: Secondary | ICD-10-CM | POA: Diagnosis not present

## 2013-11-22 ENCOUNTER — Emergency Department (HOSPITAL_COMMUNITY): Payer: Medicare Other

## 2013-11-22 ENCOUNTER — Inpatient Hospital Stay (HOSPITAL_COMMUNITY)
Admission: EM | Admit: 2013-11-22 | Discharge: 2013-11-25 | DRG: 864 | Disposition: A | Discharge status: Home Health | Payer: Medicare Other | Attending: Internal Medicine | Admitting: Internal Medicine

## 2013-11-22 ENCOUNTER — Encounter (HOSPITAL_COMMUNITY): Payer: Self-pay | Admitting: Emergency Medicine

## 2013-11-22 DIAGNOSIS — I4821 Permanent atrial fibrillation: Secondary | ICD-10-CM | POA: Diagnosis present

## 2013-11-22 DIAGNOSIS — E876 Hypokalemia: Secondary | ICD-10-CM

## 2013-11-22 DIAGNOSIS — E871 Hypo-osmolality and hyponatremia: Secondary | ICD-10-CM

## 2013-11-22 DIAGNOSIS — Z87891 Personal history of nicotine dependence: Secondary | ICD-10-CM | POA: Diagnosis not present

## 2013-11-22 DIAGNOSIS — D126 Benign neoplasm of colon, unspecified: Secondary | ICD-10-CM

## 2013-11-22 DIAGNOSIS — I10 Essential (primary) hypertension: Secondary | ICD-10-CM

## 2013-11-22 DIAGNOSIS — M069 Rheumatoid arthritis, unspecified: Secondary | ICD-10-CM | POA: Diagnosis present

## 2013-11-22 DIAGNOSIS — R5381 Other malaise: Secondary | ICD-10-CM | POA: Diagnosis present

## 2013-11-22 DIAGNOSIS — A084 Viral intestinal infection, unspecified: Secondary | ICD-10-CM

## 2013-11-22 DIAGNOSIS — L03115 Cellulitis of right lower limb: Secondary | ICD-10-CM

## 2013-11-22 DIAGNOSIS — I4891 Unspecified atrial fibrillation: Secondary | ICD-10-CM | POA: Diagnosis not present

## 2013-11-22 DIAGNOSIS — K449 Diaphragmatic hernia without obstruction or gangrene: Secondary | ICD-10-CM

## 2013-11-22 DIAGNOSIS — R509 Fever, unspecified: Secondary | ICD-10-CM | POA: Diagnosis not present

## 2013-11-22 DIAGNOSIS — Z86718 Personal history of other venous thrombosis and embolism: Secondary | ICD-10-CM

## 2013-11-22 DIAGNOSIS — J069 Acute upper respiratory infection, unspecified: Secondary | ICD-10-CM | POA: Diagnosis not present

## 2013-11-22 DIAGNOSIS — I739 Peripheral vascular disease, unspecified: Secondary | ICD-10-CM

## 2013-11-22 DIAGNOSIS — R319 Hematuria, unspecified: Secondary | ICD-10-CM | POA: Diagnosis present

## 2013-11-22 DIAGNOSIS — I482 Chronic atrial fibrillation, unspecified: Secondary | ICD-10-CM

## 2013-11-22 DIAGNOSIS — B3781 Candidal esophagitis: Secondary | ICD-10-CM

## 2013-11-22 DIAGNOSIS — Z8551 Personal history of malignant neoplasm of bladder: Secondary | ICD-10-CM

## 2013-11-22 DIAGNOSIS — J189 Pneumonia, unspecified organism: Secondary | ICD-10-CM

## 2013-11-22 DIAGNOSIS — I2510 Atherosclerotic heart disease of native coronary artery without angina pectoris: Secondary | ICD-10-CM | POA: Diagnosis present

## 2013-11-22 DIAGNOSIS — E7800 Pure hypercholesterolemia, unspecified: Secondary | ICD-10-CM | POA: Diagnosis present

## 2013-11-22 DIAGNOSIS — K5730 Diverticulosis of large intestine without perforation or abscess without bleeding: Secondary | ICD-10-CM

## 2013-11-22 DIAGNOSIS — R7989 Other specified abnormal findings of blood chemistry: Secondary | ICD-10-CM

## 2013-11-22 DIAGNOSIS — Z7901 Long term (current) use of anticoagulants: Secondary | ICD-10-CM

## 2013-11-22 DIAGNOSIS — Z79899 Other long term (current) drug therapy: Secondary | ICD-10-CM

## 2013-11-22 DIAGNOSIS — Z803 Family history of malignant neoplasm of breast: Secondary | ICD-10-CM

## 2013-11-22 DIAGNOSIS — R059 Cough, unspecified: Secondary | ICD-10-CM | POA: Diagnosis not present

## 2013-11-22 DIAGNOSIS — N182 Chronic kidney disease, stage 2 (mild): Secondary | ICD-10-CM | POA: Diagnosis present

## 2013-11-22 DIAGNOSIS — R05 Cough: Secondary | ICD-10-CM | POA: Diagnosis not present

## 2013-11-22 DIAGNOSIS — A419 Sepsis, unspecified organism: Secondary | ICD-10-CM

## 2013-11-22 DIAGNOSIS — K219 Gastro-esophageal reflux disease without esophagitis: Secondary | ICD-10-CM

## 2013-11-22 DIAGNOSIS — I712 Thoracic aortic aneurysm, without rupture: Secondary | ICD-10-CM | POA: Diagnosis not present

## 2013-11-22 DIAGNOSIS — K644 Residual hemorrhoidal skin tags: Secondary | ICD-10-CM

## 2013-11-22 DIAGNOSIS — C679 Malignant neoplasm of bladder, unspecified: Secondary | ICD-10-CM

## 2013-11-22 DIAGNOSIS — R600 Localized edema: Secondary | ICD-10-CM

## 2013-11-22 DIAGNOSIS — Z8249 Family history of ischemic heart disease and other diseases of the circulatory system: Secondary | ICD-10-CM

## 2013-11-22 DIAGNOSIS — R609 Edema, unspecified: Secondary | ICD-10-CM | POA: Diagnosis not present

## 2013-11-22 DIAGNOSIS — K7689 Other specified diseases of liver: Secondary | ICD-10-CM | POA: Diagnosis present

## 2013-11-22 DIAGNOSIS — Z8673 Personal history of transient ischemic attack (TIA), and cerebral infarction without residual deficits: Secondary | ICD-10-CM | POA: Diagnosis not present

## 2013-11-22 DIAGNOSIS — J40 Bronchitis, not specified as acute or chronic: Secondary | ICD-10-CM

## 2013-11-22 DIAGNOSIS — K2940 Chronic atrophic gastritis without bleeding: Secondary | ICD-10-CM

## 2013-11-22 DIAGNOSIS — F0390 Unspecified dementia without behavioral disturbance: Secondary | ICD-10-CM | POA: Diagnosis present

## 2013-11-22 DIAGNOSIS — K632 Fistula of intestine: Secondary | ICD-10-CM

## 2013-11-22 DIAGNOSIS — E785 Hyperlipidemia, unspecified: Secondary | ICD-10-CM | POA: Diagnosis not present

## 2013-11-22 DIAGNOSIS — I82401 Acute embolism and thrombosis of unspecified deep veins of right lower extremity: Secondary | ICD-10-CM

## 2013-11-22 DIAGNOSIS — H9209 Otalgia, unspecified ear: Secondary | ICD-10-CM | POA: Diagnosis not present

## 2013-11-22 LAB — BASIC METABOLIC PANEL
CO2: 26 mEq/L (ref 19–32)
Chloride: 92 mEq/L — ABNORMAL LOW (ref 96–112)
GFR calc non Af Amer: 75 mL/min — ABNORMAL LOW (ref >90)
Potassium: 3.5 mEq/L (ref 3.5–5.1)
Sodium: 131 mEq/L — ABNORMAL LOW (ref 135–145)

## 2013-11-22 LAB — URINALYSIS, ROUTINE W REFLEX MICROSCOPIC
Glucose, UA: NEGATIVE mg/dL
Ketones, ur: NEGATIVE mg/dL
Leukocytes, UA: NEGATIVE
Nitrite: NEGATIVE
Protein, ur: NEGATIVE mg/dL
Specific Gravity, Urine: 1.005 (ref 1.005–1.030)
Urobilinogen, UA: 0.2 mg/dL (ref 0.0–1.0)
pH: 5.5 (ref 5.0–8.0)

## 2013-11-22 LAB — CBC WITH DIFFERENTIAL/PLATELET
Basophils Absolute: 0 10*3/uL (ref 0.0–0.1)
HCT: 40.5 % (ref 36.0–46.0)
Lymphocytes Relative: 25 % (ref 12–46)
MCHC: 35.3 g/dL (ref 30.0–36.0)
Monocytes Absolute: 1.1 10*3/uL — ABNORMAL HIGH (ref 0.1–1.0)
Monocytes Relative: 16 % — ABNORMAL HIGH (ref 3–12)
Neutro Abs: 3.8 10*3/uL (ref 1.7–7.7)
Neutrophils Relative %: 55 % (ref 43–77)
Platelets: 273 10*3/uL (ref 150–400)
RBC: 4.26 MIL/uL (ref 3.87–5.11)
RDW: 13.2 % (ref 11.5–15.5)
WBC: 6.9 10*3/uL (ref 4.0–10.5)

## 2013-11-22 LAB — POCT I-STAT TROPONIN I

## 2013-11-22 LAB — PROTIME-INR: Prothrombin Time: 20 seconds — ABNORMAL HIGH (ref 11.6–15.2)

## 2013-11-22 MED ORDER — PROMETHAZINE HCL 25 MG PO TABS
12.5000 mg | ORAL_TABLET | Freq: Four times a day (QID) | ORAL | Status: DC | PRN
  Filled 2013-11-22: qty 1

## 2013-11-22 MED ORDER — ACETAMINOPHEN 325 MG PO TABS
650.0000 mg | ORAL_TABLET | Freq: Once | ORAL | Status: AC
  Administered 2013-11-22: 650 mg via ORAL
  Filled 2013-11-22: qty 2

## 2013-11-22 MED ORDER — WARFARIN - PHARMACIST DOSING INPATIENT: Freq: Every day | Status: DC

## 2013-11-22 MED ORDER — PANTOPRAZOLE SODIUM 40 MG PO TBEC
40.0000 mg | DELAYED_RELEASE_TABLET | Freq: Every day | ORAL | Status: DC
  Administered 2013-11-22 – 2013-11-25 (×4): 40 mg via ORAL
  Filled 2013-11-22 (×5): qty 1

## 2013-11-22 MED ORDER — ATORVASTATIN CALCIUM 10 MG PO TABS
5.0000 mg | ORAL_TABLET | Freq: Every day | ORAL | Status: DC
  Administered 2013-11-22 – 2013-11-24 (×3): 5 mg via ORAL
  Filled 2013-11-22 (×4): qty 0.5

## 2013-11-22 MED ORDER — METOPROLOL SUCCINATE ER 50 MG PO TB24
50.0000 mg | ORAL_TABLET | Freq: Every day | ORAL | Status: DC
  Administered 2013-11-23 – 2013-11-24 (×2): 50 mg via ORAL
  Filled 2013-11-22 (×4): qty 1

## 2013-11-22 MED ORDER — GUAIFENESIN ER 600 MG PO TB12
600.0000 mg | ORAL_TABLET | Freq: Two times a day (BID) | ORAL | Status: DC
  Administered 2013-11-22 – 2013-11-25 (×6): 600 mg via ORAL
  Filled 2013-11-22 (×8): qty 1

## 2013-11-22 MED ORDER — DILTIAZEM HCL ER COATED BEADS 240 MG PO CP24
240.0000 mg | ORAL_CAPSULE | Freq: Every day | ORAL | Status: DC
  Administered 2013-11-23 – 2013-11-25 (×3): 240 mg via ORAL
  Filled 2013-11-22 (×4): qty 1

## 2013-11-22 MED ORDER — WARFARIN SODIUM 7.5 MG PO TABS
7.5000 mg | ORAL_TABLET | Freq: Once | ORAL | Status: AC
  Administered 2013-11-22: 7.5 mg via ORAL
  Filled 2013-11-22: qty 1

## 2013-11-22 NOTE — ED Notes (Signed)
Pt transported to CT ?

## 2013-11-22 NOTE — ED Provider Notes (Signed)
CSN: [id number]     Arrival date & time 11/22/13  1517 History   First MD Initiated Contact with Patient 11/22/13 1539     Chief Complaint  Patient presents with  . Fever   (Consider location/radiation/quality/duration/timing/severity/associated sxs/prior Treatment) HPI Comments: History presents emergency department with chief complaints of shortness of breath and cough. Patient is accompanied by her daughter, who states that over the past couple of days, the patient has not been feeling well. She has had less energy than usual. She states that she's not been eating regularly. The daughter also states that today the patient was running a low-grade fever. Patient was recently discharged approximately 2 weeks ago for septic shock, which is thought to have been from right lower extremity cellulitis. Additionally, the daughter states that since the patient was admitted, she had complained of some head pain, and "not feeling right in the head." She states that the patient still complaining of the symptoms. Patient is a high fall risk, but the daughter states that she has not fallen recently. The daughter does state that she hit her head on a shelf a couple of days ago. Patient is taking Coumadin for A. fib. Patient also has history of bladder cancer, as well as transurethral resection of the bladder. 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 Imaging was performed 2 weeks ago on this, and it appears to be unchanged and was not an issue in the patient's recent hospitalization.  The history is provided by the patient. No language interpreter was used.    Past Medical History  Diagnosis Date  . Coronary artery disease     Nonobstructive  . GERD (gastroesophageal reflux disease)   . Headache(784.0)   . High cholesterol   . DVT (deep venous thrombosis), right 2005    "dry blood; after 8 foot fall"  . Chronic bronchitis     "get it basically 3 times/yr"  . Blood transfusion 1960   . H/O hiatal hernia   . Rheumatoid arthritis(714.0)   . Concussion w/o coma 03/31/2004    Complicated by subarachnoid hemorrhage.  "even now has times when she's not able to comprehend" (05/08/12)  . Chronic atrial fibrillation     Anticoagulated with warfarin, rate control with diltiazem and Toprol  . Pneumonia ~ 2010 AND 2013  . 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 Diverticulitis   . Diverticulosis   . External hemorrhoids   . Fatty liver   . AAA (abdominal aortic aneurysm)   . Edema of both legs     Chronic, thought to be secondary to DVTs   Past Surgical History  Procedure Laterality Date  . Transurethral resection of bladder tumor  11/11/2011    Procedure: TRANSURETHRAL RESECTION OF BLADDER TUMOR (TURBT);  Surgeon: Anner Crete;  Location: [facility];  Service: Urology;  Laterality: N/A;  Cysto, Bladder Biopsy, TURBT with Gyrus,   . Cystoscopy  11/11/2011    Procedure: CYSTOSCOPY;  Surgeon: Anner Crete;  Location: [facility];  Service: Urology;  Laterality: N/A;  . Bladder lift  YRS AGO  . Bladder cancer  2010; 2009    "for tumors on surface of bladder"  . Knee arthroscopy  2005    right; S/P fall  . Cataract extraction w/ intraocular lens  implant, bilateral Bilateral 1992  . Back surgery  1976 UPPER BACK    tumor removed-benign  . Tonsillectomy  1952  .  Breast cyst excision  YRS AGO    2 cysts removed-left breast-benign  . Cardiac catheterization  1980's  . Abdominal hysterectomy  1960    partial hysterectomy  . Left thumb  benign tumor removed  1980'S  . Total knee arthroplasty Right 02/05/2013    Procedure: TOTAL KNEE ARTHROPLASTY;  Surgeon: Shelda Pal, MD;  Location: [facility];  Service: Orthopedics;  Laterality: Right;  . Cystostomy w/ bladder biopsy  10/08/2013   Family History  Problem Relation Age of Onset  . Anesthesia problems Daughter   . Prostate cancer Brother   . Breast cancer Other     neice  . Heart  disease Brother   . Colon cancer Neg Hx    History  Substance Use Topics  . Smoking status: Former Smoker -- 1.00 packs/day for 50 years    Types: Cigarettes    Quit date: 11/07/1982  . Smokeless tobacco: Never Used  . Alcohol Use: 4.2 oz/week    7 Glasses of wine per week   OB History   Grav Para Term Preterm Abortions TAB SAB Ect Mult Living                 Review of Systems  All other systems reviewed and are negative.    Allergies  Contrast media; Levaquin; Penicillins; and Vancomycin  Home Medications   Current Outpatient Rx  Name  Route  Sig  Dispense  Refill  . acetaminophen (TYLENOL) 500 MG tablet   Oral   Take 1,000 mg by mouth every 6 (six) hours as needed for pain.         . calcium carbonate (OS-CAL - DOSED IN MG OF ELEMENTAL CALCIUM) 1250 MG tablet   Oral   Take 1 tablet by mouth daily.         . cephALEXin (KEFLEX) 500 MG capsule   Oral   Take 1 capsule (500 mg total) by mouth 3 (three) times daily. X 5 days   15 capsule   0   . cholecalciferol (VITAMIN D) 1000 UNITS tablet   Oral   Take 1,000 Units by mouth daily.         . Cinnamon 500 MG capsule   Oral   Take 1,000 mg by mouth daily.         Marland Kitchen diltiazem (CARDIZEM CD) 240 MG 24 hr capsule   Oral   Take 240 mg by mouth daily.         Marland Kitchen guaiFENesin (MUCINEX) 600 MG 12 hr tablet   Oral   Take 600 mg by mouth 2 (two) times daily as needed for congestion.         . metoprolol (TOPROL-XL) 50 MG 24 hr tablet   Oral   Take 50 mg by mouth at bedtime.          . Multiple Vitamins-Minerals (MULTIVITAMINS THER. W/MINERALS) TABS   Oral   Take 1 tablet by mouth daily.          Marland Kitchen omeprazole (PRILOSEC) 20 MG capsule   Oral   Take 20 mg by mouth 2 (two) times daily.         . promethazine (PHENERGAN) 12.5 MG tablet   Oral   Take 1 tablet (12.5 mg total) by mouth every 6 (six) hours as needed for nausea.   30 tablet   0   . psyllium (METAMUCIL) 58.6 % powder   Oral   Take  1 packet by mouth 2 (two) times  daily.          . rosuvastatin (CRESTOR) 5 MG tablet   Oral   Take 5 mg by mouth daily.          Marland Kitchen saccharomyces boulardii (FLORASTOR) 250 MG capsule   Oral   Take 1 capsule (250 mg total) by mouth 2 (two) times daily.   14 capsule   0   . warfarin (COUMADIN) 5 MG tablet   Oral   Take 5 mg by mouth daily. Take 7.5mg  by mouth on Thurs, take 5mg  every other day          BP 114/41  Pulse 73  Temp(Src) 99.8 F (37.7 C) (Oral)  Resp 16  Ht 5\' 5"  (1.651 m)  Wt 160 lb (72.576 kg)  BMI 26.63 kg/m2  SpO2 97% Physical Exam  Nursing note and vitals reviewed. Constitutional: She is oriented to person, place, and time. She appears well-developed and well-nourished.  HENT:  Head: Normocephalic and atraumatic.  Eyes: Conjunctivae and EOM are normal. Pupils are equal, round, and reactive to light.  Neck: Normal range of motion. Neck supple.  Cardiovascular: Exam reveals no gallop and no friction rub.   No murmur heard. Irregularly irregular  Pulmonary/Chest: Effort normal. No respiratory distress. She has wheezes. She has no rales. She exhibits no tenderness.  Bilateral wheezing  Abdominal: Soft. Bowel sounds are normal. She exhibits no distension and no mass. There is tenderness. There is no rebound and no guarding.  Diffuse abdominal tenderness  Musculoskeletal: Normal range of motion. She exhibits no edema and no tenderness.  Right lower extremity is mildly swollen compared to the left, no evidence of erythema or cellulitis  Neurological: She is alert and oriented to person, place, and time.  CN 3-12 intact, sensation and strength intact throughout  Skin: Skin is warm and dry.  Psychiatric: She has a normal mood and affect. Her behavior is normal. Judgment and thought content normal.    ED Course  Procedures (including critical care time) Results for orders placed during the hospital encounter of 11/22/13  CBC WITH DIFFERENTIAL      Result  Value Range   WBC 6.9  4.0 - 10.5 K/uL   RBC 4.26  3.87 - 5.11 MIL/uL   Hemoglobin 14.3  12.0 - 15.0 g/dL   HCT 16.1  09.6 - 04.5 %   MCV 95.1  78.0 - 100.0 fL   MCH 33.6  26.0 - 34.0 pg   MCHC 35.3  30.0 - 36.0 g/dL   RDW 40.9  81.1 - 91.4 %   Platelets 273  150 - 400 K/uL   Neutrophils Relative % 55  43 - 77 %   Neutro Abs 3.8  1.7 - 7.7 K/uL   Lymphocytes Relative 25  12 - 46 %   Lymphs Abs 1.7  0.7 - 4.0 K/uL   Monocytes Relative 16 (*) 3 - 12 %   Monocytes Absolute 1.1 (*) 0.1 - 1.0 K/uL   Eosinophils Relative 4  0 - 5 %   Eosinophils Absolute 0.2  0.0 - 0.7 K/uL   Basophils Relative 0  0 - 1 %   Basophils Absolute 0.0  0.0 - 0.1 K/uL  BASIC METABOLIC PANEL      Result Value Range   Sodium 131 (*) 135 - 145 mEq/L   Potassium 3.5  3.5 - 5.1 mEq/L   Chloride 92 (*) 96 - 112 mEq/L   CO2 26  19 - 32 mEq/L  Glucose, Bld 100 (*) 70 - 99 mg/dL   BUN 9  6 - 23 mg/dL   Creatinine, Ser 2.84  0.50 - 1.10 mg/dL   Calcium 9.2  8.4 - 13.2 mg/dL   GFR calc non Af Amer 75 (*) >90 mL/min   GFR calc Af Amer 87 (*) >90 mL/min  URINALYSIS, ROUTINE W REFLEX MICROSCOPIC      Result Value Range   Color, Urine YELLOW  YELLOW   APPearance CLEAR  CLEAR   Specific Gravity, Urine 1.005  1.005 - 1.030   pH 5.5  5.0 - 8.0   Glucose, UA NEGATIVE  NEGATIVE mg/dL   Hgb urine dipstick LARGE (*) NEGATIVE   Bilirubin Urine NEGATIVE  NEGATIVE   Ketones, ur NEGATIVE  NEGATIVE mg/dL   Protein, ur NEGATIVE  NEGATIVE mg/dL   Urobilinogen, UA 0.2  0.0 - 1.0 mg/dL   Nitrite NEGATIVE  NEGATIVE   Leukocytes, UA NEGATIVE  NEGATIVE  PROTIME-INR      Result Value Range   Prothrombin Time 20.0 (*) 11.6 - 15.2 seconds   INR 1.76 (*) 0.00 - 1.49  GLUCOSE, CAPILLARY      Result Value Range   Glucose-Capillary 111 (*) 70 - 99 mg/dL   Comment 1 Documented in Chart     Comment 2 Notify RN    URINE MICROSCOPIC-ADD ON      Result Value Range   WBC, UA 0-2  <3 WBC/hpf   RBC / HPF 21-50  <3 RBC/hpf  CG4 I-STAT  (LACTIC ACID)      Result Value Range   Lactic Acid, Venous 1.15  0.5 - 2.2 mmol/L  POCT I-STAT TROPONIN I      Result Value Range   Troponin i, poc 0.00  0.00 - 0.08 ng/mL   Comment 3            Ct Abdomen Pelvis Wo Contrast  11/07/2013   CLINICAL DATA:  Coloenteric fistula.  EXAM: CT ABDOMEN AND PELVIS WITHOUT CONTRAST  TECHNIQUE: Multidetector CT imaging of the abdomen and pelvis was performed following the standard protocol without intravenous contrast.  COMPARISON:  October 19, 2013.  FINDINGS: Visualized lung bases appear normal. These unenhanced images demonstrate no focal abnormality of on the liver, spleen or pancreas. No gallstones are noted. Adrenal glands appear normal. No hydronephrosis or renal obstruction is noted. No renal or ureteral calculi are noted. Atherosclerotic calcifications of abdominal aorta and iliac arteries are noted. Stable 3.2 cm aneurysmal dilatation of proximal infrarenal abdominal aorta is noted. Stable 6.5 cm cyst is seen arising from the left kidney. No evidence of bowel obstruction is noted. Extensive sigmoid diverticulosis is noted without inflammation. There appears to be no significant change involving the small fistula noted on prior CT scans arising from the proximal sigmoid colon best seen on image number 65 of series 5. Urinary bladder appears normal. No abnormal fluid collection is noted.  IMPRESSION: Stable 3.2 cm infrarenal abdominal aortic aneurysm.  Stable 6.5 cm left renal cyst.  Stable sigmoid diverticulosis without inflammation.  No change is seen involving the residual fistula arising from the proximal sigmoid colon compared to prior exam. This fistula is not seen to definitively fill with contrast, and there is no surrounding inflammation. No abscess or abnormal fluid collection is noted on this study.   Electronically Signed   By: Roque Lias M.D.   On: 11/07/2013 19:42   Dg Chest 2 View  11/22/2013   CLINICAL DATA:  Fever, cough.  EXAM: CHEST  2  VIEW  COMPARISON:  11/07/2013.  FINDINGS: The heart size and mediastinal contours are within normal limits. Both lungs are clear. The visualized skeletal structures are unremarkable.  IMPRESSION: No active cardiopulmonary disease.   Electronically Signed   By: Elige Ko   On: 11/22/2013 16:49   Ct Head Wo Contrast  11/22/2013   CLINICAL DATA:  Mastoid pain.  EXAM: CT HEAD WITHOUT CONTRAST  TECHNIQUE: Contiguous axial images were obtained from the base of the skull through the vertex without intravenous contrast.  COMPARISON:  01/30/2013  FINDINGS: There is no evidence of mass effect, midline shift, or extra-axial fluid collections. There is no evidence of a space-occupying lesion or intracranial hemorrhage. There is no evidence of a cortical-based area of acute infarction. There is generalized cerebral atrophy. There is periventricular white matter low attenuation likely secondary to microangiopathy.  The ventricles and sulci are appropriate for the patient's age. The basal cisterns are patent.  Visualized portions of the orbits are unremarkable. The visualized portions of the paranasal sinuses and mastoid air cells are unremarkable. Cerebrovascular atherosclerotic calcifications are noted.  The osseous structures are unremarkable.  IMPRESSION: No acute intracranial pathology.   Electronically Signed   By: Elige Ko   On: 11/22/2013 17:33   Dg Chest Port 1 View  11/07/2013   CLINICAL DATA:  Weakness and fever  EXAM: PORTABLE CHEST - 1 VIEW  COMPARISON:  02/07/2013  FINDINGS: The cardiac shadow is stable. Mild interstitial changes are again noted bilaterally without focal infiltrate or sizable effusion no acute bony abnormality is seen.  IMPRESSION: Chronic changes without acute abnormality.   Electronically Signed   By: Alcide Clever M.D.   On: 11/07/2013 12:40      EKG Interpretation   None       MDM   1. Fever     Patient recently admitted for septic shock. Daughter states the patient  follow better for the first couple of days, and then began to feel worse again. Decreased in energy. She complaining of chronic abdominal pain. CT was basically negative during recent admission. No obvious source for sepsis was found.   Patient is febrile in the ED, otherwise labs artery assuring. Will admit the patient to observation.  Patient seen by and discussed with Dr. Jeraldine Loots.  Anticipate admission.  Roxy Horseman, PA-C 11/22/13 1825

## 2013-11-22 NOTE — ED Provider Notes (Signed)
  This was a shared visit with a mid-level provided (NP or PA).  Throughout the patient's course I was available for consultation/collaboration.  I saw the ECG (if appropriate), relevant labs and studies - I agree with the interpretation.  On my exam the patient was in no distress.  However, with her fever, her comorbidities, including: colono- enteric fistula and her recent admission for sepsis, she was admitted for further E/M.     Gerhard Munch, MD 11/22/13 [phone]

## 2013-11-22 NOTE — Progress Notes (Signed)
ANTICOAGULATION CONSULT NOTE - Initial Consult  Pharmacy Consult for coumadin Indication: atrial fibrillation  Allergies  Allergen Reactions  . Contrast Media [Iodinated Diagnostic Agents] Hives  . Levaquin [Levofloxacin] Itching and Rash  . Penicillins Rash    "haven't had it in years"  . Vancomycin Itching and Rash    Patient Measurements: Height: 5\' 5"  (165.1 cm) Weight: 160 lb (72.576 kg) IBW/kg (Calculated) : 57 Heparin Dosing Weight:   Vital Signs: Temp: 98.4 F (36.9 C) (12/11 1935) Temp src: Oral (12/11 1935) BP: 125/72 mmHg (12/11 1930) Pulse Rate: 77 (12/11 1930)  Labs:  Recent Labs  11/22/13 1616 11/22/13 1621  HGB 14.3  --   HCT 40.5  --   PLT 273  --   LABPROT  --  20.0*  INR  --  1.76*  CREATININE 0.66  --     Estimated Creatinine Clearance: 46.6 ml/min (by C-G formula based on Cr of 0.66).   Medical History: Past Medical History  Diagnosis Date  . Coronary artery disease     Nonobstructive  . GERD (gastroesophageal reflux disease)   . Headache(784.0)   . High cholesterol   . DVT (deep venous thrombosis), right 2005    "dry blood; after 8 foot fall"  . Chronic bronchitis     "get it basically 3 times/yr"  . Blood transfusion 1960  . H/O hiatal hernia   . Rheumatoid arthritis(714.0)   . Concussion w/o coma 03/31/2004    Complicated by subarachnoid hemorrhage.  "even now has times when she's not able to comprehend" (05/08/12)  . Chronic atrial fibrillation     Anticoagulated with warfarin, rate control with diltiazem and Toprol  . Pneumonia ~ 2010 AND 2013  . 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 Diverticulitis   . Diverticulosis   . External hemorrhoids   . Fatty liver   . AAA (abdominal aortic aneurysm)   . Edema of both legs     Chronic, thought to be secondary to DVTs    Medications:  Scheduled:  . [START ON 11/23/2013] atorvastatin  5 mg Oral q1800  . diltiazem  240 mg Oral  Daily  . guaiFENesin  600 mg Oral BID  . metoprolol succinate  50 mg Oral QHS  . pantoprazole  40 mg Oral Daily  . warfarin  7.5 mg Oral Once  . [START ON 11/23/2013] Warfarin - Pharmacist Dosing Inpatient   Does not apply q1800    Assessment: 79 yr old female presents to ED with SOB and cough. She was released from the hospital 2 weeks ago after she was admitted for septic shock. She has been running a low grade fever for several days. Pt has been on coumadin for a.fib and pharmacy has been asked to dose. INR is 1.76. Home dose is 7.5 mg on Thur and 5 mg on the other days. Goal of Therapy:  INR 2-3    Plan:  Coumadin 7.5 mg tonight. Daily PT/INR  Eugene Garnet 11/22/2013,8:27 PM

## 2013-11-22 NOTE — ED Notes (Signed)
Pt was discharged from [facility] for septic shock 2 weeks ago. Daughter states the pt has not felt well since. Daughter states over past few days the pt has not eaten and has been lethargic. Today she started running a fever.

## 2013-11-22 NOTE — ED Notes (Signed)
Pt in xray

## 2013-11-22 NOTE — ED Notes (Signed)
Per pt family member, states pt has been confused since discharge from hospital. States pt hasn't been eating or drinking. States pt has been really pale. Per family member, states temp was 104, pt was given 1 tylenol. Pt states she has left flank pain when she coughs. States she feels SOB. Intermittent confusion per mother.

## 2013-11-22 NOTE — ED Notes (Signed)
CBG 111 

## 2013-11-23 ENCOUNTER — Inpatient Hospital Stay (HOSPITAL_COMMUNITY): Payer: Medicare Other

## 2013-11-23 DIAGNOSIS — I712 Thoracic aortic aneurysm, without rupture: Secondary | ICD-10-CM | POA: Diagnosis not present

## 2013-11-23 DIAGNOSIS — I4891 Unspecified atrial fibrillation: Secondary | ICD-10-CM | POA: Diagnosis not present

## 2013-11-23 DIAGNOSIS — I10 Essential (primary) hypertension: Secondary | ICD-10-CM

## 2013-11-23 DIAGNOSIS — Z86718 Personal history of other venous thrombosis and embolism: Secondary | ICD-10-CM

## 2013-11-23 DIAGNOSIS — E871 Hypo-osmolality and hyponatremia: Secondary | ICD-10-CM

## 2013-11-23 DIAGNOSIS — R509 Fever, unspecified: Secondary | ICD-10-CM | POA: Diagnosis not present

## 2013-11-23 LAB — BASIC METABOLIC PANEL
CO2: 26 mEq/L (ref 19–32)
Chloride: 95 mEq/L — ABNORMAL LOW (ref 96–112)
GFR calc Af Amer: 88 mL/min — ABNORMAL LOW (ref >90)
Glucose, Bld: 106 mg/dL — ABNORMAL HIGH (ref 70–99)
Potassium: 3.4 mEq/L — ABNORMAL LOW (ref 3.5–5.1)
Sodium: 131 mEq/L — ABNORMAL LOW (ref 135–145)

## 2013-11-23 LAB — CBC
Hemoglobin: 12.6 g/dL (ref 12.0–15.0)
MCH: 32.2 pg (ref 26.0–34.0)
MCV: 93.4 fL (ref 78.0–100.0)
Platelets: 220 10*3/uL (ref 150–400)
RBC: 3.91 MIL/uL (ref 3.87–5.11)
WBC: 5.1 10*3/uL (ref 4.0–10.5)

## 2013-11-23 LAB — D-DIMER, QUANTITATIVE: D-Dimer, Quant: 0.58 ug/mL-FEU — ABNORMAL HIGH (ref 0.00–0.48)

## 2013-11-23 LAB — SEDIMENTATION RATE: Sed Rate: 42 mm/hr — ABNORMAL HIGH (ref 0–22)

## 2013-11-23 LAB — RHEUMATOID FACTOR: Rhuematoid fact SerPl-aCnc: 30 IU/mL — ABNORMAL HIGH (ref <=14)

## 2013-11-23 MED ORDER — ACETAMINOPHEN 325 MG PO TABS
650.0000 mg | ORAL_TABLET | Freq: Four times a day (QID) | ORAL | Status: DC | PRN
  Administered 2013-11-23: 650 mg via ORAL
  Filled 2013-11-23: qty 2

## 2013-11-23 MED ORDER — WARFARIN SODIUM 7.5 MG PO TABS
7.5000 mg | ORAL_TABLET | Freq: Once | ORAL | Status: AC
  Administered 2013-11-23: 7.5 mg via ORAL
  Filled 2013-11-23: qty 1

## 2013-11-23 NOTE — Progress Notes (Signed)
[facility] PROGRESS NOTE  Adriana GLASCO ZOX:[id number] DOB: 1921-07-19 DOA: 11/22/2013 PCP: Pearson Grippe, MD  Assessment/Plan: 1. Fever: no fever over night.  ddx broad for low level fever and malaise of about 3 wks duration and includes infection, malignancy, thrombotic event (though unlikely as she is anticoagulated).  ESR, Ddimer ordered. Will get CT of chest. Possible ECHO to ro endocarditis. Continue to monitor 2. Hyponatremia: recheck BMET 3. A-fib: rate controled, anticoagulated 4. Hematuria: has chronic hematuria and known history of bladder cancer 5. History of DVT in right LE: no increase in swelling, pain, has been anticoagulated 6.   Code Status: FULL Family Communication: spoke with patient Disposition Plan: remain inpatient for now   Consultants: None  Procedures:  none  Antibiotics: None  HPI/Subjective: Comfortable this morning.  Has a cough and some muscle soreness when she coughs. Is hungry, no nausea.  Objective: Filed Vitals:   11/23/13 0506  BP: 116/65  Pulse: 80  Temp: 98.4 F (36.9 C)  Resp: 20    Intake/Output Summary (Last 24 hours) at 11/23/13 0759 Last data filed at 11/23/13 0703  Gross per 24 hour  Intake    120 ml  Output    700 ml  Net   -580 ml   Filed Weights   11/22/13 1524 11/22/13 2215  Weight: 72.576 kg (160 lb) 63.9 kg (140 lb 14 oz)    Exam:   General:  NAD, resting in bed  Cardiovascular: irreg, rate controled, no mrg  Respiratory: poor effort, clear  Abdomen: bs decreased, soft, some tenderness over LLQ  Musculoskeletal: no warm, tender or swollen joints  EXT: trace edema bilaterally, no erythema or tenderness  Data Reviewed: Basic Metabolic Panel:  Recent Labs Lab 11/22/13 1616  NA 131*  K 3.5  CL 92*  CO2 26  GLUCOSE 100*  BUN 9  CREATININE 0.66  CALCIUM 9.2   Liver Function Tests: No results found for this basename: AST, ALT, ALKPHOS, BILITOT, PROT, ALBUMIN,  in the last 168  hours No results found for this basename: LIPASE, AMYLASE,  in the last 168 hours No results found for this basename: AMMONIA,  in the last 168 hours CBC:  Recent Labs Lab 11/22/13 1616  WBC 6.9  NEUTROABS 3.8  HGB 14.3  HCT 40.5  MCV 95.1  PLT 273   Cardiac Enzymes: No results found for this basename: CKTOTAL, CKMB, CKMBINDEX, TROPONINI,  in the last 168 hours BNP (last 3 results) No results found for this basename: PROBNP,  in the last 8760 hours CBG:  Recent Labs Lab 11/22/13 1552  GLUCAP 111*    No results found for this or any previous visit (from the past 240 hour(s)).   Studies: Dg Chest 2 View  11/22/2013   CLINICAL DATA:  Fever, cough.  EXAM: CHEST  2 VIEW  COMPARISON:  11/07/2013.  FINDINGS: The heart size and mediastinal contours are within normal limits. Both lungs are clear. The visualized skeletal structures are unremarkable.  IMPRESSION: No active cardiopulmonary disease.   Electronically Signed   By: Elige Ko   On: 11/22/2013 16:49   Ct Head Wo Contrast  11/22/2013   CLINICAL DATA:  Mastoid pain.  EXAM: CT HEAD WITHOUT CONTRAST  TECHNIQUE: Contiguous axial images were obtained from the base of the skull through the vertex without intravenous contrast.  COMPARISON:  01/30/2013  FINDINGS: There is no evidence of mass effect, midline shift, or extra-axial fluid collections. There is no evidence of a space-occupying lesion  or intracranial hemorrhage. There is no evidence of a cortical-based area of acute infarction. There is generalized cerebral atrophy. There is periventricular white matter low attenuation likely secondary to microangiopathy.  The ventricles and sulci are appropriate for the patient's age. The basal cisterns are patent.  Visualized portions of the orbits are unremarkable. The visualized portions of the paranasal sinuses and mastoid air cells are unremarkable. Cerebrovascular atherosclerotic calcifications are noted.  The osseous structures are  unremarkable.  IMPRESSION: No acute intracranial pathology.   Electronically Signed   By: Elige Ko   On: 11/22/2013 17:33    Scheduled Meds: . atorvastatin  5 mg Oral q1800  . diltiazem  240 mg Oral Daily  . guaiFENesin  600 mg Oral BID  . metoprolol succinate  50 mg Oral QHS  . pantoprazole  40 mg Oral Daily  . Warfarin - Pharmacist Dosing Inpatient   Does not apply q1800   Continuous Infusions:   Active Problems:   Bladder cancer   Rheumatoid arthritis(714.0)   Chronic anticoagulation   Chronic atrial fibrillation   Colo-enteric fistula   Fever   Hyponatremia   History of DVT of lower extremity    Time spent: 35 minutes   [facility]  [facility] Pager [phone] If 7PM-7AM, please contact night-coverage at [email or web address], password [facility] 11/23/2013, 7:59 AM  LOS: 1 day

## 2013-11-23 NOTE — Progress Notes (Signed)
[facility]  Patient Status: Active (receiving services up to time of hospitalization)  [facility] is providing the following services: RN, PT and OT  If patient discharges after hours, please call [phone].   [name] 11/23/2013, 11:14 AM

## 2013-11-23 NOTE — Progress Notes (Signed)
ANTICOAGULATION CONSULT NOTE - follow up  Pharmacy Consult for coumadin Indication: atrial fibrillation  Allergies  Allergen Reactions  . Contrast Media [Iodinated Diagnostic Agents] Hives  . Levaquin [Levofloxacin] Itching and Rash  . Penicillins Rash    "haven't had it in years"  . Vancomycin Itching and Rash    Patient Measurements: Height: 5\' 5"  (165.1 cm) Weight: 140 lb 14 oz (63.9 kg) IBW/kg (Calculated) : 57 Heparin Dosing Weight:   Vital Signs: Temp: 98.4 F (36.9 C) (12/12 0506) Temp src: Oral (12/12 0506) BP: 116/65 mmHg (12/12 0506) Pulse Rate: 80 (12/12 0506)  Labs:  Recent Labs  11/22/13 1616 11/22/13 1621 11/23/13 0529 11/23/13 0835  HGB 14.3  --   --  12.6  HCT 40.5  --   --  36.5  PLT 273  --   --  220  LABPROT  --  20.0* 19.5*  --   INR  --  1.76* 1.70*  --   CREATININE 0.66  --   --  0.65    Estimated Creatinine Clearance: 42.1 ml/min (by C-G formula based on Cr of 0.65).   Medical History: Past Medical History  Diagnosis Date  . Coronary artery disease     Nonobstructive  . GERD (gastroesophageal reflux disease)   . Headache(784.0)   . High cholesterol   . DVT (deep venous thrombosis), right 2005    "dry blood; after 8 foot fall"  . Chronic bronchitis     "get it basically 3 times/yr"  . Blood transfusion 1960  . H/O hiatal hernia   . Rheumatoid arthritis(714.0)   . Concussion w/o coma 03/31/2004    Complicated by subarachnoid hemorrhage.  "even now has times when she's not able to comprehend" (05/08/12)  . Chronic atrial fibrillation     Anticoagulated with warfarin, rate control with diltiazem and Toprol  . Pneumonia ~ 2010 AND 2013  . 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 Diverticulitis   . Diverticulosis   . External hemorrhoids   . Fatty liver   . AAA (abdominal aortic aneurysm)   . Edema of both legs     Chronic, thought to be secondary to DVTs    Medications:   Scheduled:  . atorvastatin  5 mg Oral q1800  . diltiazem  240 mg Oral Daily  . guaiFENesin  600 mg Oral BID  . metoprolol succinate  50 mg Oral QHS  . pantoprazole  40 mg Oral Daily  . Warfarin - Pharmacist Dosing Inpatient   Does not apply q1800    Assessment: 79 yr old female presents to ED with SOB and cough. She was released from the hospital 2 weeks ago after she was admitted for septic shock. She has been running a low grade fever for several days. Pt has been on coumadin for a.fib and pharmacy has been asked to dose. INR today 1.7. Home dose is 7.5 mg on Thur and 5 mg on the other days. Goal of Therapy:  INR 2-3    Plan:  Coumadin 7.5 mg tonight. Daily PT/INR  ,  [place] 11/23/2013,1:22 PM

## 2013-11-23 NOTE — H&P (Signed)
[facility] History and Physical  ABBIGAILE ROCKMAN HYQ:[id number] DOB: [birth year] DOA: 11/22/2013  THIS IS A LATE ENTRY DUE TO EPIC DOWN TIME  Referring physician: EDP PCP: Pearson Grippe, MD   Chief Complaint:  Weakness, fever  HPI: [name] is a 79 y.o. 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  She is here with her two daughters who help her with the history.  They report that after discharge she had about 4 days of good health.  For the past 10 days, however, she has become progressively weaker, eating less, more confused.  There have been no specific complaints.  No chest pain, cough, shortness of breath, diarrhea, vomiting, no sweats or measured fevers.  In the ED she has a rectal temp of 101.9 and we are asked to admit.  Review of Systems:  Constitutional: No weight loss, night sweats, Fevers, chills, fatigue.  HEENT: No headaches, Difficulty swallowing,Tooth/dental problems,Sore throat, No sneezing, itching, ear ache, nasal congestion, post nasal drip,  Cardio-vascular: No chest pain, Orthopnea, PND, swelling in lower extremities, anasarca, dizziness, palpitations  GI: No heartburn, indigestion, abdominal pain, vomiting, diarrhea, change in bowel habits, she does have nausea and loss of appetite as above. Resp: No shortness of breath with exertion or at rest. No excess mucus, no productive cough, No non-productive cough, No coughing up of blood.No change in color of mucus.No wheezing.No chest wall deformity  Skin: no rash or lesions (cellulitis from last admission has resolved) GU: no dysuria, change in color of urine, no urgency or frequency. No flank pain.  Musculoskeletal: No joint pain or swelling. No decreased  range of motion. No back pain.  Psych: No change in mood or affect. No depression or anxiety. There has been some confusion   Past Medical History  Diagnosis Date  . Coronary artery disease     Nonobstructive  . GERD (gastroesophageal reflux disease)   . Headache(784.0)   . High cholesterol   . DVT (deep venous thrombosis), right 2005    "dry blood; after 8 foot fall"  . Chronic bronchitis     "get it basically 3 times/yr"  . Blood transfusion 1960  . H/O hiatal hernia   . Rheumatoid arthritis(714.0)   . Concussion w/o coma 03/31/2004    Complicated by subarachnoid hemorrhage.  "even now has times when she's not able to comprehend" (05/08/12)  . Chronic atrial fibrillation     Anticoagulated with warfarin, rate control with diltiazem and Toprol  . Pneumonia ~ 2010 AND 2013  . 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 Diverticulitis   . Diverticulosis   . External hemorrhoids   . Fatty liver   . AAA (abdominal aortic aneurysm)   . Edema of both legs     Chronic, thought to be secondary to DVTs   Past Surgical History  Procedure Laterality Date  . Transurethral resection of bladder tumor  11/11/2011    Procedure: TRANSURETHRAL RESECTION OF BLADDER TUMOR (TURBT);  Surgeon: Anner Crete;  Location: [facility];  Service: Urology;  Laterality: N/A;  Cysto, Bladder Biopsy, TURBT with Gyrus,   . Cystoscopy  11/11/2011    Procedure: CYSTOSCOPY;  Surgeon: Anner Crete;  Location: [facility];  Service: Urology;  Laterality: N/A;  . Bladder lift  YRS AGO  . Bladder cancer  2010; 2009    "for tumors on surface of bladder"  . Knee arthroscopy  2005    right; S/P fall  . Cataract extraction w/ intraocular lens  implant, bilateral Bilateral 1992  . Back surgery  1976 UPPER BACK    tumor removed-benign  . Tonsillectomy  1952  . Breast cyst excision  YRS AGO    2 cysts removed-left breast-benign  . Cardiac catheterization  1980's  . Abdominal  hysterectomy  1960    partial hysterectomy  . Left thumb  benign tumor removed  1980'S  . Total knee arthroplasty Right 02/05/2013    Procedure: TOTAL KNEE ARTHROPLASTY;  Surgeon: Shelda Pal, MD;  Location: [facility];  Service: Orthopedics;  Laterality: Right;  . Cystostomy w/ bladder biopsy  10/08/2013   Social History:  reports that she quit smoking about 31 years ago. Her smoking use included Cigarettes. She has a 50 pack-year smoking history. She has never used smokeless tobacco. She reports that she drinks about 4.2 ounces of alcohol per week. She reports that she does not use illicit drugs.  Allergies  Allergen Reactions  . Contrast Media [Iodinated Diagnostic Agents] Hives  . Levaquin [Levofloxacin] Itching and Rash  . Penicillins Rash    "haven't had it in years"  . Vancomycin Itching and Rash    Family History  Problem Relation Age of Onset  . Anesthesia problems Daughter   . Prostate cancer Brother   . Breast cancer Other     neice  . Heart disease Brother   . Colon cancer Neg Hx      Prior to Admission medications   Medication Sig Start Date End Date Taking? Authorizing Provider  acetaminophen (TYLENOL) 500 MG tablet Take 1,000 mg by mouth every 6 (six) hours as needed for pain.   Yes Historical Provider, MD  calcium carbonate (OS-CAL - DOSED IN MG OF ELEMENTAL CALCIUM) 1250 MG tablet Take 1 tablet by mouth every morning.    Yes Historical Provider, MD  cholecalciferol (VITAMIN D) 1000 UNITS tablet Take 1,000 Units by mouth daily.   Yes Historical Provider, MD  Cinnamon 500 MG capsule Take 1,000 mg by mouth daily.   Yes Historical Provider, MD  diltiazem (CARDIZEM CD) 240 MG 24 hr capsule Take 240 mg by mouth daily. 11/02/13  Yes Historical Provider, MD  furosemide (LASIX) 40 MG tablet Take 40 mg by mouth. On Monday, Wednesday, and Friday take two tablets (80mg ). On Tuesday, Thursday, Saturday and Sunday take 1 tablet (40mg )   Yes Historical Provider, MD  metoprolol  (TOPROL-XL) 50 MG 24 hr tablet Take 50 mg by mouth at bedtime.    Yes Historical Provider, MD  Multiple Vitamins-Minerals (MULTIVITAMINS THER. W/MINERALS) TABS Take 1 tablet by mouth daily.    Yes Historical Provider, MD  omeprazole (PRILOSEC) 20 MG capsule Take 20 mg by mouth 2 (two) times daily.   Yes Historical Provider, MD  promethazine (PHENERGAN) 12.5 MG tablet Take 1 tablet (12.5 mg total) by mouth every 6 (six) hours as needed for nausea. 11/10/13  Yes Ripudeep K Rai, MD  psyllium (METAMUCIL) 58.6 % powder Take 1 packet by mouth 2 (two) times daily.    Yes Historical Provider, MD  rosuvastatin (CRESTOR) 5 MG tablet Take 5 mg by mouth every evening.    Yes Historical Provider, MD  spironolactone (ALDACTONE) 25 MG tablet Take 25 mg by mouth every morning.  Yes Historical Provider, MD  warfarin (COUMADIN) 5 MG tablet Take 5 mg by mouth daily. Take 7.5mg  by mouth on Thurs, take 5mg  every other day   Yes Historical Provider, MD   Physical Exam: Filed Vitals:   11/23/13 0506  BP: 116/65  Pulse: 80  Temp: 98.4 F (36.9 C)  Resp: 20    BP 116/65  Pulse 80  Temp(Src) 98.4 F (36.9 C) (Oral)  Resp 20  Ht 5\' 5"  (1.651 m)  Wt 63.9 kg (140 lb 14 oz)  BMI 23.44 kg/m2  SpO2 96%  EXAM GENERAL: fatigued, no distress, weak apearing, laying on stretcher HEENT: left eye has post surgical change, right eye pupil reactive, conjunctiva clear, MMdry, oropharynx is clear, no cervical LAD,  CV: irreg, rate controled, SEM 3/6 PULM: poor effort, clear, no wheezes, rhonchi, rales ABD: bs decreased, soft, non tender, no gaurding, no mass EXT: trace edema bilaterally, +1 pulses, there is slight erythema over the anterior right calf MSK: strength is 5/5 throughout NEURO: oriented to person, place and situation, CN2-12 grossly intact, non focal exam          Labs on Admission:  Basic Metabolic Panel:  Recent Labs Lab 11/22/13 1616  NA 131*  K 3.5  CL 92*  CO2 26  GLUCOSE 100*  BUN 9   CREATININE 0.66  CALCIUM 9.2   Liver Function Tests: No results found for this basename: AST, ALT, ALKPHOS, BILITOT, PROT, ALBUMIN,  in the last 168 hours No results found for this basename: LIPASE, AMYLASE,  in the last 168 hours No results found for this basename: AMMONIA,  in the last 168 hours CBC:  Recent Labs Lab 11/22/13 1616  WBC 6.9  NEUTROABS 3.8  HGB 14.3  HCT 40.5  MCV 95.1  PLT 273   Cardiac Enzymes: No results found for this basename: CKTOTAL, CKMB, CKMBINDEX, TROPONINI,  in the last 168 hours  BNP (last 3 results) No results found for this basename: PROBNP,  in the last 8760 hours CBG:  Recent Labs Lab 11/22/13 1552  GLUCAP 111*    Radiological Exams on Admission: Dg Chest 2 View  11/22/2013   CLINICAL DATA:  Fever, cough.  EXAM: CHEST  2 VIEW  COMPARISON:  11/07/2013.  FINDINGS: The heart size and mediastinal contours are within normal limits. Both lungs are clear. The visualized skeletal structures are unremarkable.  IMPRESSION: No active cardiopulmonary disease.   Electronically Signed   By: Elige Ko   On: 11/22/2013 16:49   Ct Head Wo Contrast  11/22/2013   CLINICAL DATA:  Mastoid pain.  EXAM: CT HEAD WITHOUT CONTRAST  TECHNIQUE: Contiguous axial images were obtained from the base of the skull through the vertex without intravenous contrast.  COMPARISON:  01/30/2013  FINDINGS: There is no evidence of mass effect, midline shift, or extra-axial fluid collections. There is no evidence of a space-occupying lesion or intracranial hemorrhage. There is no evidence of a cortical-based area of acute infarction. There is generalized cerebral atrophy. There is periventricular white matter low attenuation likely secondary to microangiopathy.  The ventricles and sulci are appropriate for the patient's age. The basal cisterns are patent.  Visualized portions of the orbits are unremarkable. The visualized portions of the paranasal sinuses and mastoid air cells are  unremarkable. Cerebrovascular atherosclerotic calcifications are noted.  The osseous structures are unremarkable.  IMPRESSION: No acute intracranial pathology.   Electronically Signed   By: Elige Ko   On: 11/22/2013 17:33    EKG:  Independently reviewed. afib controled, LVH  Assessment/Plan 1) fever and weakness:  Etiology unclear at this point.  WBC's normal.  No specific symptoms.  She does have RBC's in the urine which is chronic for her after treated bladder cancer- it is possible the fever represents recurrence of this malignancy and will look for last evaluation.  There is no evidence of UTI.  CT of the head is normal, no sign of sinusitis, mass or other abnormality.  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  The cellulitis on the left lower extremity has resolved. She has had symptoms now for about 3 weeks.  Cultures form last admission were negative, blood cultures have been collected again today.  There is mention of RA in her history- I will check an ESR, RF and CCP.  She is hyponatremic and the possibility of a pulmonary malignancy exists (there has been no CT of the lung/chest).  Will check a d-dimer and if positive get a PE protocol to ro PE at the same time. Otherwise will obtain regular CT chest.  Would also consider ECHO to look for endocarditis after cellulits.  Code Status: full Family Communication: patient and two daughters Disposition Plan: admit to obs tele Time spent: 45 minutes  [facility] Pager [phone]

## 2013-11-23 NOTE — Care Management Note (Unsigned)
    Page 1 of 1   11/23/2013     4:55:51 PM   CARE MANAGEMENT NOTE 11/23/2013  Patient:  [name], [name]   Account Number:  [id number]  Date Initiated:  11/23/2013  Documentation initiated by:  Letha Cape  Subjective/Objective Assessment:   dx fever  admit- lives with daughter.     Action/Plan:   Anticipated DC Date:  11/25/2013   Anticipated DC Plan:  HOME W HOME HEALTH SERVICES      DC Planning Services  CM consult      Choice offered to / List presented to:             Status of service:  In process, will continue to follow Medicare Important Message given?   (If response is "NO", the following Medicare IM given date fields will be blank) Date Medicare IM given:   Date Additional Medicare IM given:    Discharge Disposition:    Per UR Regulation:  Reviewed for med. necessity/level of care/duration of stay  If discussed at Long Length of Stay Meetings, dates discussed:    Comments:  11/23/13 16:55 Letha Cape RN, BSN [phone] patient lives with daughter, NCM will continue to follow for dc needs.

## 2013-11-23 NOTE — Progress Notes (Signed)
[facility]  Patient Status: Active (receiving services up to time of hospitalization)  [facility] is providing the following services: RN, PT and OT  If patient discharges after hours, please call [phone].   [name] 11/23/2013, 2:09 PM

## 2013-11-24 DIAGNOSIS — R609 Edema, unspecified: Secondary | ICD-10-CM

## 2013-11-24 DIAGNOSIS — E876 Hypokalemia: Secondary | ICD-10-CM | POA: Diagnosis not present

## 2013-11-24 DIAGNOSIS — R509 Fever, unspecified: Secondary | ICD-10-CM | POA: Diagnosis not present

## 2013-11-24 DIAGNOSIS — I4891 Unspecified atrial fibrillation: Secondary | ICD-10-CM | POA: Diagnosis not present

## 2013-11-24 LAB — CBC
HCT: 37.1 % (ref 36.0–46.0)
Hemoglobin: 12.6 g/dL (ref 12.0–15.0)
MCH: 32.1 pg (ref 26.0–34.0)
MCHC: 34 g/dL (ref 30.0–36.0)
MCV: 94.4 fL (ref 78.0–100.0)
Platelets: 220 K/uL (ref 150–400)
RBC: 3.93 MIL/uL (ref 3.87–5.11)
RDW: 13.3 % (ref 11.5–15.5)
WBC: 4.8 K/uL (ref 4.0–10.5)

## 2013-11-24 LAB — PROTIME-INR
INR: 1.83 — ABNORMAL HIGH (ref 0.00–1.49)
Prothrombin Time: 20.6 seconds — ABNORMAL HIGH (ref 11.6–15.2)

## 2013-11-24 LAB — BASIC METABOLIC PANEL
CO2: 26 mEq/L (ref 19–32)
Calcium: 8.3 mg/dL — ABNORMAL LOW (ref 8.4–10.5)
Chloride: 98 mEq/L (ref 96–112)
GFR calc Af Amer: 86 mL/min — ABNORMAL LOW (ref >90)
GFR calc non Af Amer: 74 mL/min — ABNORMAL LOW (ref >90)
Glucose, Bld: 123 mg/dL — ABNORMAL HIGH (ref 70–99)
Potassium: 3.9 mEq/L (ref 3.5–5.1)
Sodium: 134 mEq/L — ABNORMAL LOW (ref 135–145)

## 2013-11-24 LAB — TSH: TSH: 2.612 u[IU]/mL (ref 0.350–4.500)

## 2013-11-24 MED ORDER — DOCUSATE SODIUM 100 MG PO CAPS
100.0000 mg | ORAL_CAPSULE | Freq: Two times a day (BID) | ORAL | Status: DC
  Administered 2013-11-24 – 2013-11-25 (×2): 100 mg via ORAL
  Filled 2013-11-24 (×2): qty 1

## 2013-11-24 MED ORDER — POLYETHYLENE GLYCOL 3350 17 G PO PACK
17.0000 g | PACK | Freq: Every day | ORAL | Status: DC
  Administered 2013-11-24 – 2013-11-25 (×2): 17 g via ORAL
  Filled 2013-11-24 (×2): qty 1

## 2013-11-24 MED ORDER — WARFARIN SODIUM 7.5 MG PO TABS
7.5000 mg | ORAL_TABLET | Freq: Once | ORAL | Status: AC
  Administered 2013-11-24: 7.5 mg via ORAL
  Filled 2013-11-24: qty 1

## 2013-11-24 NOTE — Evaluation (Signed)
Physical Therapy Evaluation Patient Details Name: [name] MRN: [id number] DOB: September 10, 1921 Today's Date: 77/13/2014 Time: 7829-5621 PT Time Calculation (min): 22 min  PT Assessment / Plan / Recommendation History of Present Illness  79 y.o. 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  She is here with her two daughters who help her with the history.  They report that after discharge she had about 4 days of good health.  For the past 10 days, however, she has become progressively weaker, eating less, more confused.  Clinical Impression  *Pt demo's balance impairment and gait instability without use of RW. Pt with slight decrease insight to deficits as pt declining use of RW and initially HHPT services due to "I do fine when they're not there, so I don't think I need them." Pt con't to require 24/7 supervision for safe d/c home due to increased falls risk. Per pt daughter to be there all the time. Recommend con't of HHPT services.    PT Assessment  Patient needs continued PT services    Follow Up Recommendations  Home health PT;Supervision - Intermittent    Does the patient have the potential to tolerate intense rehabilitation      Barriers to Discharge        Equipment Recommendations  None recommended by PT (pt has equip at home)    Recommendations for Other Services     Frequency Min 3X/week    Precautions / Restrictions Precautions Precautions: Fall Restrictions Weight Bearing Restrictions: No   Pertinent Vitals/Pain Denies pain      Mobility  Bed Mobility Bed Mobility: Supine to Sit Supine to Sit: 5: Supervision Details for Bed Mobility Assistance: safe technique Transfers Transfers: Sit to Stand;Stand  to Sit Sit to Stand: 4: Min guard;With upper extremity assist;From bed Stand to Sit: 4: Min guard;With upper extremity assist;To chair/3-in-1 Details for Transfer Assistance: pt slightly impulsive due to urgency to use bathroom Ambulation/Gait Ambulation/Gait Assistance: 4: Min guard Ambulation Distance (Feet): 450 Feet Assistive device: None Ambulation/Gait Assistance Details: pt declined use of RW however suspect pt to have increased stabilty with RW. Pt with freq holding onto hallway rail. Gait Pattern: Step-through pattern Gait velocity: decreased General Gait Details: no episodes of LOB however pt unsteady  Stairs: No    Exercises     PT Diagnosis: Difficulty walking;Generalized weakness  PT Problem List: Decreased strength;Decreased activity tolerance;Decreased balance;Decreased mobility;Decreased knowledge of use of DME;Pain;Cardiopulmonary status limiting activity PT Treatment Interventions: DME instruction;Gait training;Functional mobility training;Therapeutic activities;Therapeutic exercise;Balance training;Patient/family education     PT Goals(Current goals can be found in the care plan section) Acute Rehab PT Goals Patient Stated Goal: to feel better and go home PT Goal Formulation: With patient Time For Goal Achievement: 12/01/13 Potential to Achieve Goals: Good  Visit Information  Last PT Received On: 77/13/14 Assistance Needed: +1 History of Present Illness: 79 y.o. 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  She is here with her two daughters who help her with the history.  They report that after discharge she had about 4 days  of good health.  For the past 10 days, however, she has become progressively weaker,  eating less, more confused.       Prior Functioning  Home Living Family/patient expects to be discharged to:: Private residence Living Arrangements: Children Available Help at Discharge: Family;Available 24 hours/day Type of Home: House Home Access: Ramped entrance Home Layout: One level Home Equipment: Emergency planning/management officer - 2 wheels;Cane - single point Prior Function Level of Independence: Independent;Independent with assistive device(s) Comments: pt only uses cane for long distances in community. pt still drives and does all ADLs and IADLs Communication Communication: No difficulties Dominant Hand: Right    Cognition  Cognition Arousal/Alertness: Awake/alert Behavior During Therapy: WFL for tasks assessed/performed Overall Cognitive Status: Within Functional Limits for tasks assessed (pt demo's slight decrease insight to deficits)    Extremity/Trunk Assessment Upper Extremity Assessment Upper Extremity Assessment: Generalized weakness Lower Extremity Assessment Lower Extremity Assessment: Generalized weakness Cervical / Trunk Assessment Cervical / Trunk Assessment: Normal   Balance    End of Session PT - End of Session Equipment Utilized During Treatment: Gait belt Activity Tolerance: Patient tolerated treatment well Patient left: in chair;with call bell/phone within reach Nurse Communication: Mobility status  GP     Marcene Brawn 11/24/2013, 4:16 PM  Lewis Shock, PT, DPT Pager #: [phone] Office #: [phone]

## 2013-11-24 NOTE — Progress Notes (Signed)
[facility] PROGRESS NOTE  [name]:[id number] DOB: 1920/12/14 DOA: 11/22/2013 PCP: Pearson Grippe, MD  Assessment/Plan: 1. Fever: No fever now for 48 hours. She is clinically better, more energetic. I beginning to show signs of upper respiratory infection.  CT chest is unremarkable. 2. Hyponatremia: normal today 3. A-fib: rate controled, anticoagulated 4. Hematuria: has chronic hematuria and known history of bladder cancer 5. History of DVT in right LE: no increase in swelling, pain, has been anticoagulated 6. CKD II: stable 7. Hyponatremia: continue to monitor 8. Generalized malaise and weakness: will consult PT today.  Code Status: FULL Family Communication: spoke with patient Disposition Plan: remain inpatient for now, will get PT recommendations for equipment and home safety placement.   Consultants: None  Procedures:  none  Antibiotics: None  HPI/Subjective: Comfortable this morning.  Has a cough runny nose. Overall more energetic and feeling better.  Objective: Filed Vitals:   11/24/13 0916  BP: 107/64  Pulse: 77  Temp:   Resp:     Intake/Output Summary (Last 24 hours) at 11/24/13 1346 Last data filed at 11/24/13 0104  Gross per 24 hour  Intake      0 ml  Output   1750 ml  Net  -1750 ml   Filed Weights   11/22/13 1524 11/22/13 2215  Weight: 72.576 kg (160 lb) 63.9 kg (140 lb 14 oz)    Exam:   General:  NAD, resting in bed  Cardiovascular: irreg, rate controled, no mrg  Respiratory: scattered wheezes, good air movement.  Abdomen: bs decreased, soft, some tenderness over LLQ  Musculoskeletal: no warm, tender or swollen joints  EXT: trace edema bilaterally, no erythema or tenderness  Data Reviewed: Basic Metabolic Panel:  Recent Labs Lab 11/22/13 1616 11/23/13 0835 11/24/13 0345  NA 131* 131* 134*  K 3.5 3.4* 3.9  CL 92* 95* 98  CO2 26 26 26   GLUCOSE 100* 106* 123*  BUN 9 9 13   CREATININE 0.66 0.65 0.69  CALCIUM  9.2 8.6 8.3*   Liver Function Tests: No results found for this basename: AST, ALT, ALKPHOS, BILITOT, PROT, ALBUMIN,  in the last 168 hours No results found for this basename: LIPASE, AMYLASE,  in the last 168 hours No results found for this basename: AMMONIA,  in the last 168 hours CBC:  Recent Labs Lab 11/22/13 1616 11/23/13 0835 11/24/13 0345  WBC 6.9 5.1 4.8  NEUTROABS 3.8  --   --   HGB 14.3 12.6 12.6  HCT 40.5 36.5 37.1  MCV 95.1 93.4 94.4  PLT 273 220 220   Cardiac Enzymes: No results found for this basename: CKTOTAL, CKMB, CKMBINDEX, TROPONINI,  in the last 168 hours BNP (last 3 results) No results found for this basename: PROBNP,  in the last 8760 hours CBG:  Recent Labs Lab 11/22/13 1552  GLUCAP 111*    Recent Results (from the past 240 hour(s))  CULTURE, BLOOD (ROUTINE X 2)     Status: None   Collection Time    11/22/13  4:20 PM      Result Value Range Status   Specimen Description BLOOD ARM RIGHT   Final   Special Requests BOTTLES DRAWN AEROBIC ONLY 10CC   Final   Culture  Setup Time     Final   Value: 11/22/2013 21:02     Performed at [facility]   Culture     Final   Value:        BLOOD CULTURE RECEIVED NO GROWTH  TO DATE CULTURE WILL BE HELD FOR 5 DAYS BEFORE ISSUING A FINAL NEGATIVE REPORT     Performed at [facility]   Report Status PENDING   Incomplete  CULTURE, BLOOD (ROUTINE X 2)     Status: None   Collection Time    11/22/13  4:25 PM      Result Value Range Status   Specimen Description BLOOD HAND RIGHT   Final   Special Requests BOTTLES DRAWN AEROBIC ONLY 6CC   Final   Culture  Setup Time     Final   Value: 11/22/2013 21:03     Performed at [facility]   Culture     Final   Value:        BLOOD CULTURE RECEIVED NO GROWTH TO DATE CULTURE WILL BE HELD FOR 5 DAYS BEFORE ISSUING A FINAL NEGATIVE REPORT     Performed at [facility]   Report Status PENDING   Incomplete     Studies: Dg Chest 2  View  11/22/2013   CLINICAL DATA:  Fever, cough.  EXAM: CHEST  2 VIEW  COMPARISON:  11/07/2013.  FINDINGS: The heart size and mediastinal contours are within normal limits. Both lungs are clear. The visualized skeletal structures are unremarkable.  IMPRESSION: No active cardiopulmonary disease.   Electronically Signed   By: Elige Ko   On: 11/22/2013 16:49   Ct Head Wo Contrast  11/22/2013   CLINICAL DATA:  Mastoid pain.  EXAM: CT HEAD WITHOUT CONTRAST  TECHNIQUE: Contiguous axial images were obtained from the base of the skull through the vertex without intravenous contrast.  COMPARISON:  01/30/2013  FINDINGS: There is no evidence of mass effect, midline shift, or extra-axial fluid collections. There is no evidence of a space-occupying lesion or intracranial hemorrhage. There is no evidence of a cortical-based area of acute infarction. There is generalized cerebral atrophy. There is periventricular white matter low attenuation likely secondary to microangiopathy.  The ventricles and sulci are appropriate for the patient's age. The basal cisterns are patent.  Visualized portions of the orbits are unremarkable. The visualized portions of the paranasal sinuses and mastoid air cells are unremarkable. Cerebrovascular atherosclerotic calcifications are noted.  The osseous structures are unremarkable.  IMPRESSION: No acute intracranial pathology.   Electronically Signed   By: Elige Ko   On: 11/22/2013 17:33   Ct Chest Wo Contrast  11/23/2013   CLINICAL DATA:  Fever and malaise.  EXAM: CT CHEST WITHOUT CONTRAST  TECHNIQUE: Multidetector CT imaging of the chest was performed following the standard protocol without IV contrast.  COMPARISON:  Chest radiograph performed 11/22/2013, and CT of the chest performed 05/08/2012  FINDINGS: Minimal scarring is noted in the periphery of both lungs. Mild bilateral lower lobe bronchiectasis is seen. There is no evidence of significant focal consolidation, pleural  effusion or pneumothorax. No masses are identified; no abnormal focal contrast enhancement is seen.  There is borderline aneurysmal dilatation of the ascending thoracic aorta, measuring 4.0 cm in AP dimension. Scattered mediastinal nodes remain borderline normal in size. No mediastinal lymphadenopathy is seen. Calcification is noted along the aortic arch and along the proximal left subclavian artery, with likely moderate narrowing of the proximal left subclavian artery. No pericardial effusion is seen. Scattered coronary artery calcifications are noted. No axillary lymphadenopathy is seen. A 2.1 cm hypodensity is noted within the right thyroid lobe, with a nearby calcification.  The visualized portions of the liver and spleen are unremarkable. Diverticulosis is seen along  the splenic flexure of the colon.  No acute osseous abnormalities are seen.  IMPRESSION: 1. Minimal scarring in the periphery of both lungs, with mild bilateral lower lobe bronchiectasis. Lungs otherwise clear. No evidence of focal consolidation. 2. Borderline aneurysmal dilatation of the ascending thoracic aorta, measuring 4.0 cm in AP dimension. 3. Calcification along the aortic arch and proximal left subclavian artery, with likely moderate narrowing of the proximal left subclavian artery. 4. Scattered coronary artery calcifications seen. 5. Diverticulosis along the splenic flexure of the colon. 6. 2.1 cm hypodensity within the right thyroid lobe. Consider further evaluation with thyroid ultrasound. If patient is clinically hyperthyroid, consider nuclear medicine thyroid uptake and scan.   Electronically Signed   By: Roanna Raider M.D.   On: 11/23/2013 21:49    Scheduled Meds: . atorvastatin  5 mg Oral q1800  . diltiazem  240 mg Oral Daily  . guaiFENesin  600 mg Oral BID  . metoprolol succinate  50 mg Oral QHS  . pantoprazole  40 mg Oral Daily  . warfarin  7.5 mg Oral ONCE-1800  . Warfarin - Pharmacist Dosing Inpatient   Does not apply  q1800   Continuous Infusions:   Active Problems:   Bladder cancer   Rheumatoid arthritis(714.0)   Chronic anticoagulation   Chronic atrial fibrillation   Colo-enteric fistula   Fever   Hyponatremia   History of DVT of lower extremity    Time spent: 35 minutes   [facility]  [facility] Pager [phone] If 7PM-7AM, please contact night-coverage at [email or web address], password [facility] 11/24/2013, 1:46 PM  LOS: 2 days

## 2013-11-24 NOTE — Progress Notes (Signed)
ANTICOAGULATION CONSULT NOTE - follow up  Pharmacy Consult for coumadin Indication: atrial fibrillation  Allergies  Allergen Reactions  . Contrast Media [Iodinated Diagnostic Agents] Hives  . Levaquin [Levofloxacin] Itching and Rash  . Penicillins Rash    "haven't had it in years"  . Vancomycin Itching and Rash    Patient Measurements: Height: 5\' 5"  (165.1 cm) Weight: 140 lb 14 oz (63.9 kg) IBW/kg (Calculated) : 57 Heparin Dosing Weight:   Vital Signs: Temp: 97.9 F (36.6 C) (12/13 0608) Temp src: Oral (12/13 0608) BP: 107/64 mmHg (12/13 0916) Pulse Rate: 77 (12/13 0916)  Labs:  Recent Labs  11/22/13 1616 11/22/13 1621 11/23/13 0529 11/23/13 0835 11/24/13 0345  HGB 14.3  --   --  12.6 12.6  HCT 40.5  --   --  36.5 37.1  PLT 273  --   --  220 220  LABPROT  --  20.0* 19.5*  --  20.6*  INR  --  1.76* 1.70*  --  1.83*  CREATININE 0.66  --   --  0.65 0.69    Estimated Creatinine Clearance: 42.1 ml/min (by C-G formula based on Cr of 0.69).   Medical History: Past Medical History  Diagnosis Date  . Coronary artery disease     Nonobstructive  . GERD (gastroesophageal reflux disease)   . Headache(784.0)   . High cholesterol   . DVT (deep venous thrombosis), right 2005    "dry blood; after 8 foot fall"  . Chronic bronchitis     "get it basically 3 times/yr"  . Blood transfusion 1960  . H/O hiatal hernia   . Rheumatoid arthritis(714.0)   . Concussion w/o coma 03/31/2004    Complicated by subarachnoid hemorrhage.  "even now has times when she's not able to comprehend" (05/08/12)  . Chronic atrial fibrillation     Anticoagulated with warfarin, rate control with diltiazem and Toprol  . Pneumonia ~ 2010 AND 2013  . 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 Diverticulitis   . Diverticulosis   . External hemorrhoids   . Fatty liver   . AAA (abdominal aortic aneurysm)   . Edema of both legs     Chronic, thought to be  secondary to DVTs    Medications:  Scheduled:  . atorvastatin  5 mg Oral q1800  . diltiazem  240 mg Oral Daily  . guaiFENesin  600 mg Oral BID  . metoprolol succinate  50 mg Oral QHS  . pantoprazole  40 mg Oral Daily  . Warfarin - Pharmacist Dosing Inpatient   Does not apply q1800    Assessment: 79 yr old female presents to ED with SOB and cough. She was released from the hospital 2 weeks ago after she was admitted for septic shock. She has been running a low grade fever for several days.  Pt has been on coumadin for a.fib and pharmacy has been asked to dose. INR today SUBtherapeutic at 1.83, CBC wnl and stable Of note, pt has chronic hematuria but has not had any noted changes since admission  Home dose is 7.5 mg on Thur and 5 mg on the other days.  Goal of Therapy:  INR 2-3    Plan:  - Coumadin 7.5 mg PO x 1 - Daily PT/INR - F/u discharge plans   Margie Billet, PharmD Clinical Pharmacist - Resident Pager: [phone] Pharmacy: [phone] 11/24/2013 10:56 AM

## 2013-11-25 ENCOUNTER — Inpatient Hospital Stay (HOSPITAL_COMMUNITY): Payer: Medicare Other

## 2013-11-25 DIAGNOSIS — I4891 Unspecified atrial fibrillation: Secondary | ICD-10-CM | POA: Diagnosis not present

## 2013-11-25 DIAGNOSIS — R509 Fever, unspecified: Secondary | ICD-10-CM | POA: Diagnosis not present

## 2013-11-25 DIAGNOSIS — R5381 Other malaise: Secondary | ICD-10-CM | POA: Diagnosis not present

## 2013-11-25 DIAGNOSIS — R05 Cough: Secondary | ICD-10-CM | POA: Diagnosis not present

## 2013-11-25 LAB — CBC
HCT: 36.3 % (ref 36.0–46.0)
Hemoglobin: 12.3 g/dL (ref 12.0–15.0)
MCH: 31.9 pg (ref 26.0–34.0)
MCHC: 33.9 g/dL (ref 30.0–36.0)
RDW: 13.4 % (ref 11.5–15.5)
WBC: 4.5 10*3/uL (ref 4.0–10.5)

## 2013-11-25 LAB — BASIC METABOLIC PANEL
BUN: 10 mg/dL (ref 6–23)
CO2: 26 mEq/L (ref 19–32)
Calcium: 8.5 mg/dL (ref 8.4–10.5)
GFR calc Af Amer: >90 mL/min (ref >90)
GFR calc non Af Amer: 78 mL/min — ABNORMAL LOW (ref >90)
Glucose, Bld: 115 mg/dL — ABNORMAL HIGH (ref 70–99)
Sodium: 133 mEq/L — ABNORMAL LOW (ref 135–145)

## 2013-11-25 LAB — PROTIME-INR: Prothrombin Time: 22.8 seconds — ABNORMAL HIGH (ref 11.6–15.2)

## 2013-11-25 MED ORDER — AZITHROMYCIN 250 MG PO TABS: ORAL_TABLET | ORAL | Status: DC

## 2013-11-25 MED ORDER — WARFARIN SODIUM 5 MG PO TABS
5.0000 mg | ORAL_TABLET | Freq: Once | ORAL | Status: DC
  Filled 2013-11-25: qty 1

## 2013-11-25 MED ORDER — GUAIFENESIN ER 600 MG PO TB12: 600.0000 mg | ORAL_TABLET | Freq: Two times a day (BID) | ORAL | Status: DC

## 2013-11-25 NOTE — Discharge Summary (Signed)
Physician Discharge Summary  [name]:[id number] DOB: 02-Dec-1921 DOA: 11/22/2013  PCP: Pearson Grippe, MD  Admit date: 11/22/2013 Discharge date: 11/25/2013  Time spent: 40 minutes  Recommendations for Outpatient Follow-up:  1. Home health physical therapy for strength and balance 2. Follow up with PCP within 2 weeks  Discharge Diagnoses:  Active Problems:   Bladder cancer   Rheumatoid arthritis(714.0)   Chronic anticoagulation   Chronic atrial fibrillation   Colo-enteric fistula   Fever   Hyponatremia   History of DVT of lower extremity   Discharge Condition: good  Diet recommendation: regular  Filed Weights   11/22/13 1524 11/22/13 2215  Weight: 72.576 kg (160 lb) 63.9 kg (140 lb 14 oz)    History of present illness:  [name] is a 79 y.o. 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 She is here with her two daughters who help her with the history. They report that after discharge she had about 4 days of good health. For the past 10 days, however, she has become progressively weaker, eating less, more confused. There have been no specific complaints. No chest pain, cough, shortness of breath, diarrhea, vomiting, no sweats or measured fevers. In the ED she has a rectal temp of 101.9 and we are asked to admit.  Hospital Course:  1. Fever: There was on elevated temp in the ED on admission at 101.9.  She did not have a fever during the rest of this admission.  She had no specific complaints.  She ate normally. She was not treated with antibiotics.  Blood cultures were negative. Urine studies did not show any sign of infection. CT of the chest was unremarkable no sign of pneumonia or or mass though there  was mild bilateral lower lobe bronchiectasis. On the last day of admission she seemed to have a URI and was discharged with a zpac.  There had been a mention in the record of possible RA and ESR was mildly elevated at 42, RF was positive and CCP is pending. TSH was normal. 2. Weakness/deconditioning: One of the major complaints the patient and family had upon admission was weakness.  She was evalauted by PT and further PT is recommended. I have written for HHPT. 3. Atrial fibrillation: rate controled throughout admission. Anticoagulated. 4. History of DVT: no evidence of DVT. Anticoagulated throughout admission   Procedures:  none  Consultations:  none  Discharge Exam: Filed Vitals:   11/25/13 0520  BP: 105/61  Pulse: 54  Temp: 98.4 F (36.9 C)  Resp: 17    General: NAD Cardiovascular: irreg, rate controled, no mrg Respiratory: scattered wheezes, good air movement, some coughing with converstation  Discharge Instructions  Discharge Orders   Future Orders Complete By Expires   Call MD for:  difficulty breathing, headache or visual disturbances  As directed    Call MD for:  persistant dizziness or light-headedness  As directed    Call MD for:  temperature >100.4  As directed    Diet - low sodium heart healthy  As directed    Face-to-face encounter (required for Medicare/Medicaid patients)  As directed    Comments:     I WALSH,CATHERINE certify that this patient is under my care and that I, or a nurse practitioner or physician's assistant  working with me, had a face-to-face encounter that meets the physician face-to-face encounter requirements with this patient on 11/25/2013. The encounter with the patient was in whole, or in part for the following medical condition(s) which is the primary reason for home health care deconditioning   Questions:     The encounter with the patient was in whole, or in part, for the following medical condition, which is the primary reason for home  health care:  deconditioning   I certify that, based on my findings, the following services are medically necessary home health services:  Physical therapy   My clinical findings support the need for the above services:  Unable to leave home safely without assistance and/or assistive device   Further, I certify that my clinical findings support that this patient is homebound due to:  Unable to leave home safely without assistance   Reason for Medically Necessary Home Health Services:  Therapy- Therapeutic Exercises to Increase Strength and Endurance   Home Health  As directed    Questions:     To provide the following care/treatments:  PT   Increase activity slowly  As directed        Medication List    STOP taking these medications       furosemide 40 MG tablet  Commonly known as:  LASIX      TAKE these medications       acetaminophen 500 MG tablet  Commonly known as:  TYLENOL  Take 1,000 mg by mouth every 6 (six) hours as needed for pain.     azithromycin 250 MG tablet  Commonly known as:  ZITHROMAX  Take one tablet daily for bronchitis     calcium carbonate 1250 MG tablet  Commonly known as:  OS-CAL - dosed in mg of elemental calcium  Take 1 tablet by mouth every morning.     cholecalciferol 1000 UNITS tablet  Commonly known as:  VITAMIN D  Take 1,000 Units by mouth daily.     Cinnamon 500 MG capsule  Take 1,000 mg by mouth daily.     diltiazem 240 MG 24 hr capsule  Commonly known as:  CARDIZEM CD  Take 240 mg by mouth daily.     guaiFENesin 600 MG 12 hr tablet  Commonly known as:  MUCINEX  Take 1 tablet (600 mg total) by mouth 2 (two) times daily.     metoprolol succinate 50 MG 24 hr tablet  Commonly known as:  TOPROL-XL  Take 50 mg by mouth at bedtime.     multivitamins ther. w/minerals Tabs tablet  Take 1 tablet by mouth daily.     PRILOSEC 20 MG capsule  Generic drug:  omeprazole  Take 20 mg by mouth 2 (two) times daily.     psyllium 58.6 % powder   Commonly known as:  METAMUCIL  Take 1 packet by mouth 2 (two) times daily.     rosuvastatin 5 MG tablet  Commonly known as:  CRESTOR  Take 5 mg by mouth every evening.     spironolactone 25 MG tablet  Commonly known as:  ALDACTONE  Take 25 mg by mouth every morning.     warfarin 5 MG tablet  Commonly known as:  COUMADIN  Take 5 mg by mouth daily. Take 7.5mg  by mouth on Thurs, take 5mg  every other day      ASK your doctor about these medications       promethazine 12.5 MG tablet  Commonly known as:  PHENERGAN  Take 1 tablet (12.5 mg total) by mouth every 6 (six) hours as needed for nausea.       Allergies  Allergen Reactions  . Contrast Media [Iodinated Diagnostic Agents] Hives  . Levaquin [Levofloxacin] Itching and Rash  . Penicillins Rash    "haven't had it in years"  . Vancomycin Itching and Rash       Follow-up Information   Follow up with Pearson Grippe, MD In 2 months.   Specialty:  Internal Medicine   Contact information:   [address] [place] [address] [phone]        The results of significant diagnostics from this hospitalization (including imaging, microbiology, ancillary and laboratory) are listed below for reference.    Significant Diagnostic Studies: Ct Abdomen Pelvis Wo Contrast  11/07/2013   CLINICAL DATA:  Coloenteric fistula.  EXAM: CT ABDOMEN AND PELVIS WITHOUT CONTRAST  TECHNIQUE: Multidetector CT imaging of the abdomen and pelvis was performed following the standard protocol without intravenous contrast.  COMPARISON:  October 19, 2013.  FINDINGS: Visualized lung bases appear normal. These unenhanced images demonstrate no focal abnormality of on the liver, spleen or pancreas. No gallstones are noted. Adrenal glands appear normal. No hydronephrosis or renal obstruction is noted. No renal or ureteral calculi are noted. Atherosclerotic calcifications of abdominal aorta and iliac arteries are noted. Stable 3.2 cm aneurysmal  dilatation of proximal infrarenal abdominal aorta is noted. Stable 6.5 cm cyst is seen arising from the left kidney. No evidence of bowel obstruction is noted. Extensive sigmoid diverticulosis is noted without inflammation. There appears to be no significant change involving the small fistula noted on prior CT scans arising from the proximal sigmoid colon best seen on image number 65 of series 5. Urinary bladder appears normal. No abnormal fluid collection is noted.  IMPRESSION: Stable 3.2 cm infrarenal abdominal aortic aneurysm.  Stable 6.5 cm left renal cyst.  Stable sigmoid diverticulosis without inflammation.  No change is seen involving the residual fistula arising from the proximal sigmoid colon compared to prior exam. This fistula is not seen to definitively fill with contrast, and there is no surrounding inflammation. No abscess or abnormal fluid collection is noted on this study.   Electronically Signed   By: Roque Lias M.D.   On: 11/07/2013 19:42   Dg Chest 2 View  11/25/2013   CLINICAL DATA:  Cough, sputum  EXAM: CHEST  2 VIEW  COMPARISON:  CT chest dated 11/23/2013  FINDINGS: Chronic interstitial markings/ emphysematous changes. No focal consolidation. No pleural effusion or pneumothorax.  The heart is normal in size.  Mild degenerative changes of the visualized thoracolumbar spine.  IMPRESSION: No evidence of acute cardiopulmonary disease.   Electronically Signed   By: Charline Bills M.D.   On: 11/25/2013 11:31   Dg Chest 2 View  11/22/2013   CLINICAL DATA:  Fever, cough.  EXAM: CHEST  2 VIEW  COMPARISON:  11/07/2013.  FINDINGS: The heart size and mediastinal contours are within normal limits. Both lungs are clear. The visualized skeletal structures are unremarkable.  IMPRESSION: No active cardiopulmonary disease.   Electronically Signed   By: Elige Ko   On: 11/22/2013 16:49   Ct Head Wo Contrast  11/22/2013   CLINICAL DATA:  Mastoid pain.  EXAM: CT HEAD WITHOUT CONTRAST  TECHNIQUE:  Contiguous axial images were obtained from the base of the skull through the vertex without intravenous contrast.  COMPARISON:  01/30/2013  FINDINGS: There is no evidence of mass effect, midline  shift, or extra-axial fluid collections. There is no evidence of a space-occupying lesion or intracranial hemorrhage. There is no evidence of a cortical-based area of acute infarction. There is generalized cerebral atrophy. There is periventricular white matter low attenuation likely secondary to microangiopathy.  The ventricles and sulci are appropriate for the patient's age. The basal cisterns are patent.  Visualized portions of the orbits are unremarkable. The visualized portions of the paranasal sinuses and mastoid air cells are unremarkable. Cerebrovascular atherosclerotic calcifications are noted.  The osseous structures are unremarkable.  IMPRESSION: No acute intracranial pathology.   Electronically Signed   By: Elige Ko   On: 11/22/2013 17:33   Ct Chest Wo Contrast  11/23/2013   CLINICAL DATA:  Fever and malaise.  EXAM: CT CHEST WITHOUT CONTRAST  TECHNIQUE: Multidetector CT imaging of the chest was performed following the standard protocol without IV contrast.  COMPARISON:  Chest radiograph performed 11/22/2013, and CT of the chest performed 05/08/2012  FINDINGS: Minimal scarring is noted in the periphery of both lungs. Mild bilateral lower lobe bronchiectasis is seen. There is no evidence of significant focal consolidation, pleural effusion or pneumothorax. No masses are identified; no abnormal focal contrast enhancement is seen.  There is borderline aneurysmal dilatation of the ascending thoracic aorta, measuring 4.0 cm in AP dimension. Scattered mediastinal nodes remain borderline normal in size. No mediastinal lymphadenopathy is seen. Calcification is noted along the aortic arch and along the proximal left subclavian artery, with likely moderate narrowing of the proximal left subclavian artery. No  pericardial effusion is seen. Scattered coronary artery calcifications are noted. No axillary lymphadenopathy is seen. A 2.1 cm hypodensity is noted within the right thyroid lobe, with a nearby calcification.  The visualized portions of the liver and spleen are unremarkable. Diverticulosis is seen along the splenic flexure of the colon.  No acute osseous abnormalities are seen.  IMPRESSION: 1. Minimal scarring in the periphery of both lungs, with mild bilateral lower lobe bronchiectasis. Lungs otherwise clear. No evidence of focal consolidation. 2. Borderline aneurysmal dilatation of the ascending thoracic aorta, measuring 4.0 cm in AP dimension. 3. Calcification along the aortic arch and proximal left subclavian artery, with likely moderate narrowing of the proximal left subclavian artery. 4. Scattered coronary artery calcifications seen. 5. Diverticulosis along the splenic flexure of the colon. 6. 2.1 cm hypodensity within the right thyroid lobe. Consider further evaluation with thyroid ultrasound. If patient is clinically hyperthyroid, consider nuclear medicine thyroid uptake and scan.   Electronically Signed   By: Roanna Raider M.D.   On: 11/23/2013 21:49   Dg Chest Port 1 View  11/07/2013   CLINICAL DATA:  Weakness and fever  EXAM: PORTABLE CHEST - 1 VIEW  COMPARISON:  02/07/2013  FINDINGS: The cardiac shadow is stable. Mild interstitial changes are again noted bilaterally without focal infiltrate or sizable effusion no acute bony abnormality is seen.  IMPRESSION: Chronic changes without acute abnormality.   Electronically Signed   By: Alcide Clever M.D.   On: 11/07/2013 12:40    Microbiology: Recent Results (from the past 240 hour(s))  CULTURE, BLOOD (ROUTINE X 2)     Status: None   Collection Time    11/22/13  4:20 PM      Result Value Range Status   Specimen Description BLOOD ARM RIGHT   Final   Special Requests BOTTLES DRAWN AEROBIC ONLY 10CC   Final   Culture  Setup Time     Final   Value:  11/22/2013 21:02  Performed at [facility]     Final   Value:        BLOOD CULTURE RECEIVED NO GROWTH TO DATE CULTURE WILL BE HELD FOR 5 DAYS BEFORE ISSUING A FINAL NEGATIVE REPORT     Performed at [facility]   Report Status PENDING   Incomplete  CULTURE, BLOOD (ROUTINE X 2)     Status: None   Collection Time    11/22/13  4:25 PM      Result Value Range Status   Specimen Description BLOOD HAND RIGHT   Final   Special Requests BOTTLES DRAWN AEROBIC ONLY 6CC   Final   Culture  Setup Time     Final   Value: 11/22/2013 21:03     Performed at [facility]   Culture     Final   Value:        BLOOD CULTURE RECEIVED NO GROWTH TO DATE CULTURE WILL BE HELD FOR 5 DAYS BEFORE ISSUING A FINAL NEGATIVE REPORT     Performed at [facility]   Report Status PENDING   Incomplete     Labs: Basic Metabolic Panel:  Recent Labs Lab 11/22/13 1616 11/23/13 0835 11/24/13 0345 11/25/13 0454  NA 131* 131* 134* 133*  K 3.5 3.4* 3.9 4.0  CL 92* 95* 98 98  CO2 26 26 26 26   GLUCOSE 100* 106* 123* 115*  BUN 9 9 13 10   CREATININE 0.66 0.65 0.69 0.60  CALCIUM 9.2 8.6 8.3* 8.5   Liver Function Tests: No results found for this basename: AST, ALT, ALKPHOS, BILITOT, PROT, ALBUMIN,  in the last 168 hours No results found for this basename: LIPASE, AMYLASE,  in the last 168 hours No results found for this basename: AMMONIA,  in the last 168 hours CBC:  Recent Labs Lab 11/22/13 1616 11/23/13 0835 11/24/13 0345 11/25/13 0454  WBC 6.9 5.1 4.8 4.5  NEUTROABS 3.8  --   --   --   HGB 14.3 12.6 12.6 12.3  HCT 40.5 36.5 37.1 36.3  MCV 95.1 93.4 94.4 94.3  PLT 273 220 220 213   Cardiac Enzymes: No results found for this basename: CKTOTAL, CKMB, CKMBINDEX, TROPONINI,  in the last 168 hours BNP: BNP (last 3 results) No results found for this basename: PROBNP,  in the last 8760 hours CBG:  Recent Labs Lab 11/22/13 1552  GLUCAP 111*        Signed:  WALSH,CATHERINE  [facility] 11/25/2013, 1:02 PM

## 2013-11-25 NOTE — Progress Notes (Signed)
ANTICOAGULATION CONSULT NOTE - follow up  Pharmacy Consult for coumadin Indication: atrial fibrillation  Allergies  Allergen Reactions  . Contrast Media [Iodinated Diagnostic Agents] Hives  . Levaquin [Levofloxacin] Itching and Rash  . Penicillins Rash    "haven't had it in years"  . Vancomycin Itching and Rash    Patient Measurements: Height: 5\' 5"  (165.1 cm) Weight: 140 lb 14 oz (63.9 kg) IBW/kg (Calculated) : 57  Vital Signs: Temp: 98.4 F (36.9 C) (12/14 0520) Temp src: Oral (12/14 0520) BP: 105/61 mmHg (12/14 0520) Pulse Rate: 54 (12/14 0520)  Labs:  Recent Labs  11/23/13 0529 11/23/13 0835 11/24/13 0345 11/25/13 0454  HGB  --  12.6 12.6 12.3  HCT  --  36.5 37.1 36.3  PLT  --  220 220 213  LABPROT 19.5*  --  20.6* 22.8*  INR 1.70*  --  1.83* 2.09*  CREATININE  --  0.65 0.69 0.60    Estimated Creatinine Clearance: 42.1 ml/min (by C-G formula based on Cr of 0.6).   Medical History: Past Medical History  Diagnosis Date  . Coronary artery disease     Nonobstructive  . GERD (gastroesophageal reflux disease)   . Headache(784.0)   . High cholesterol   . DVT (deep venous thrombosis), right 2005    "dry blood; after 8 foot fall"  . Chronic bronchitis     "get it basically 3 times/yr"  . Blood transfusion 1960  . H/O hiatal hernia   . Rheumatoid arthritis(714.0)   . Concussion w/o coma 03/31/2004    Complicated by subarachnoid hemorrhage.  "even now has times when she's not able to comprehend" (05/08/12)  . Chronic atrial fibrillation     Anticoagulated with warfarin, rate control with diltiazem and Toprol  . Pneumonia ~ 2010 AND 2013  . 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 Diverticulitis   . Diverticulosis   . External hemorrhoids   . Fatty liver   . AAA (abdominal aortic aneurysm)   . Edema of both legs     Chronic, thought to be secondary to DVTs    Medications:  Scheduled:  . atorvastatin  5  mg Oral q1800  . diltiazem  240 mg Oral Daily  . docusate sodium  100 mg Oral BID  . guaiFENesin  600 mg Oral BID  . metoprolol succinate  50 mg Oral QHS  . pantoprazole  40 mg Oral Daily  . polyethylene glycol  17 g Oral Daily  . Warfarin - Pharmacist Dosing Inpatient   Does not apply q1800    Assessment: 79 yr old female presents to ED with SOB and cough. She was released from the hospital 2 weeks ago after she was admitted for septic shock. She has been running a low grade fever for several days.   Pt has been on coumadin for a.fib and pharmacy has been asked to dose. INR today therapeutic at 2.09, CBC wnl and stable Of note, pt has chronic hematuria but has not had any noted changes since admission   Home dose is 7.5 mg on Thur and 5 mg on the other days.  Goal of Therapy:  INR 2-3    Plan:  - Coumadin 5 mg PO x 1 - Daily PT/INR - F/u discharge plans   Margie Billet, PharmD Clinical Pharmacist - Resident Pager: [phone] Pharmacy: [phone] 11/25/2013 10:19 AM

## 2013-11-25 NOTE — Progress Notes (Signed)
Pt given discharge instructions and PIV removed.  Pt discharged via wheelchair to discharge location.

## 2013-11-25 NOTE — Progress Notes (Signed)
   CARE MANAGEMENT NOTE 11/25/2013  Patient:  [name], [name]   Account Number:  [id number]  Date Initiated:  11/23/2013  Documentation initiated by:  Letha Cape  Subjective/Objective Assessment:   dx fever  admit- lives with daughter.     Action/Plan:   active with [facility]   Anticipated DC Date:  11/25/2013   Anticipated DC Plan:  HOME W HOME HEALTH SERVICES      DC Planning Services  CM consult      Choice offered to / List presented to:          [facility] arranged  HH-2 PT      [facility] agency  [facility]   Status of service:  Completed, signed off Medicare Important Message given?   (If response is "NO", the following Medicare IM given date fields will be blank) Date Medicare IM given:   Date Additional Medicare IM given:    Discharge Disposition:  HOME W HOME HEALTH SERVICES  Per UR Regulation:  Reviewed for med. necessity/level of care/duration of stay  If discussed at Long Length of Stay Meetings, dates discussed:    Comments:  11/25/13 17:42 CM spoke with daughter of pt who states pt already active with [facility].  [facility] notified of discharge.  No other CM needs were communicated.  Freddy Jaksch, BSN, [place] [phone].  11/23/13 16:55 Letha Cape RN, BSN [phone] patient lives with daughter, NCM will continue to follow for dc needs.

## 2013-11-26 ENCOUNTER — Telehealth: Payer: Self-pay | Admitting: Cardiology

## 2013-11-26 DIAGNOSIS — I4891 Unspecified atrial fibrillation: Secondary | ICD-10-CM | POA: Diagnosis not present

## 2013-11-26 DIAGNOSIS — E785 Hyperlipidemia, unspecified: Secondary | ICD-10-CM | POA: Diagnosis not present

## 2013-11-26 DIAGNOSIS — Z86718 Personal history of other venous thrombosis and embolism: Secondary | ICD-10-CM | POA: Diagnosis not present

## 2013-11-26 DIAGNOSIS — R609 Edema, unspecified: Secondary | ICD-10-CM | POA: Diagnosis not present

## 2013-11-26 DIAGNOSIS — I739 Peripheral vascular disease, unspecified: Secondary | ICD-10-CM | POA: Diagnosis not present

## 2013-11-26 DIAGNOSIS — I10 Essential (primary) hypertension: Secondary | ICD-10-CM | POA: Diagnosis not present

## 2013-11-26 LAB — CYCLIC CITRUL PEPTIDE ANTIBODY, IGG: Cyclic Citrullin Peptide Ab: <2 U/mL (ref 0.0–5.0)

## 2013-11-26 NOTE — Telephone Encounter (Signed)
Has some questions regarding her blood pressure and medications.  Please call.

## 2013-11-26 NOTE — Telephone Encounter (Signed)
Returned call and pt verified x 2 w/ pt's daughter, Gavin Pound.  Stated pt just got out of the hospital.  Stated they told her they were treating patient for HTN and she's never know pt to have HTN.  RN asked daughter who she was referring to as "they" and she stated the doctors at the hospital.  Daughter informed HTN is listed as a dx on pt's problem list.  Daughter stated she wants pt to come in to see Dr. Herbie Baltimore so she can talk w/ him about pt.  Stated she isn't sure about what they are doing at the hospital b/c they couldn't find anything wrong w/ pt then and she was discharged yesterday.  First available appt scheduled for 1.9.15 at 2:30pm w/ Dr. Herbie Baltimore.  Daughter denied pt with any complaints today.

## 2013-11-28 DIAGNOSIS — E785 Hyperlipidemia, unspecified: Secondary | ICD-10-CM | POA: Diagnosis not present

## 2013-11-28 DIAGNOSIS — I4891 Unspecified atrial fibrillation: Secondary | ICD-10-CM | POA: Diagnosis not present

## 2013-11-28 DIAGNOSIS — Z86718 Personal history of other venous thrombosis and embolism: Secondary | ICD-10-CM | POA: Diagnosis not present

## 2013-11-28 DIAGNOSIS — R609 Edema, unspecified: Secondary | ICD-10-CM | POA: Diagnosis not present

## 2013-11-28 DIAGNOSIS — I739 Peripheral vascular disease, unspecified: Secondary | ICD-10-CM | POA: Diagnosis not present

## 2013-11-28 DIAGNOSIS — I10 Essential (primary) hypertension: Secondary | ICD-10-CM | POA: Diagnosis not present

## 2013-11-28 LAB — CULTURE, BLOOD (ROUTINE X 2): Culture: NO GROWTH

## 2013-11-30 DIAGNOSIS — R609 Edema, unspecified: Secondary | ICD-10-CM | POA: Diagnosis not present

## 2013-11-30 DIAGNOSIS — Z86718 Personal history of other venous thrombosis and embolism: Secondary | ICD-10-CM | POA: Diagnosis not present

## 2013-11-30 DIAGNOSIS — E785 Hyperlipidemia, unspecified: Secondary | ICD-10-CM | POA: Diagnosis not present

## 2013-11-30 DIAGNOSIS — I739 Peripheral vascular disease, unspecified: Secondary | ICD-10-CM | POA: Diagnosis not present

## 2013-11-30 DIAGNOSIS — I4891 Unspecified atrial fibrillation: Secondary | ICD-10-CM | POA: Diagnosis not present

## 2013-11-30 DIAGNOSIS — I10 Essential (primary) hypertension: Secondary | ICD-10-CM | POA: Diagnosis not present

## 2013-12-04 DIAGNOSIS — I739 Peripheral vascular disease, unspecified: Secondary | ICD-10-CM | POA: Diagnosis not present

## 2013-12-04 DIAGNOSIS — I10 Essential (primary) hypertension: Secondary | ICD-10-CM | POA: Diagnosis not present

## 2013-12-04 DIAGNOSIS — R609 Edema, unspecified: Secondary | ICD-10-CM | POA: Diagnosis not present

## 2013-12-04 DIAGNOSIS — Z86718 Personal history of other venous thrombosis and embolism: Secondary | ICD-10-CM | POA: Diagnosis not present

## 2013-12-04 DIAGNOSIS — I4891 Unspecified atrial fibrillation: Secondary | ICD-10-CM | POA: Diagnosis not present

## 2013-12-04 DIAGNOSIS — E785 Hyperlipidemia, unspecified: Secondary | ICD-10-CM | POA: Diagnosis not present

## 2013-12-10 DIAGNOSIS — R609 Edema, unspecified: Secondary | ICD-10-CM | POA: Diagnosis not present

## 2013-12-10 DIAGNOSIS — I10 Essential (primary) hypertension: Secondary | ICD-10-CM | POA: Diagnosis not present

## 2013-12-10 DIAGNOSIS — E785 Hyperlipidemia, unspecified: Secondary | ICD-10-CM | POA: Diagnosis not present

## 2013-12-10 DIAGNOSIS — I739 Peripheral vascular disease, unspecified: Secondary | ICD-10-CM | POA: Diagnosis not present

## 2013-12-10 DIAGNOSIS — Z86718 Personal history of other venous thrombosis and embolism: Secondary | ICD-10-CM | POA: Diagnosis not present

## 2013-12-10 DIAGNOSIS — I4891 Unspecified atrial fibrillation: Secondary | ICD-10-CM | POA: Diagnosis not present

## 2013-12-12 DIAGNOSIS — E785 Hyperlipidemia, unspecified: Secondary | ICD-10-CM | POA: Diagnosis not present

## 2013-12-12 DIAGNOSIS — R609 Edema, unspecified: Secondary | ICD-10-CM | POA: Diagnosis not present

## 2013-12-12 DIAGNOSIS — I739 Peripheral vascular disease, unspecified: Secondary | ICD-10-CM | POA: Diagnosis not present

## 2013-12-12 DIAGNOSIS — I4891 Unspecified atrial fibrillation: Secondary | ICD-10-CM | POA: Diagnosis not present

## 2013-12-12 DIAGNOSIS — Z86718 Personal history of other venous thrombosis and embolism: Secondary | ICD-10-CM | POA: Diagnosis not present

## 2013-12-12 DIAGNOSIS — I10 Essential (primary) hypertension: Secondary | ICD-10-CM | POA: Diagnosis not present

## 2013-12-14 DIAGNOSIS — R609 Edema, unspecified: Secondary | ICD-10-CM | POA: Diagnosis not present

## 2013-12-14 DIAGNOSIS — Z86718 Personal history of other venous thrombosis and embolism: Secondary | ICD-10-CM | POA: Diagnosis not present

## 2013-12-14 DIAGNOSIS — I10 Essential (primary) hypertension: Secondary | ICD-10-CM | POA: Diagnosis not present

## 2013-12-14 DIAGNOSIS — I4891 Unspecified atrial fibrillation: Secondary | ICD-10-CM | POA: Diagnosis not present

## 2013-12-14 DIAGNOSIS — I739 Peripheral vascular disease, unspecified: Secondary | ICD-10-CM | POA: Diagnosis not present

## 2013-12-14 DIAGNOSIS — E785 Hyperlipidemia, unspecified: Secondary | ICD-10-CM | POA: Diagnosis not present

## 2013-12-17 DIAGNOSIS — I10 Essential (primary) hypertension: Secondary | ICD-10-CM | POA: Diagnosis not present

## 2013-12-17 DIAGNOSIS — R609 Edema, unspecified: Secondary | ICD-10-CM | POA: Diagnosis not present

## 2013-12-17 DIAGNOSIS — E785 Hyperlipidemia, unspecified: Secondary | ICD-10-CM | POA: Diagnosis not present

## 2013-12-17 DIAGNOSIS — I4891 Unspecified atrial fibrillation: Secondary | ICD-10-CM | POA: Diagnosis not present

## 2013-12-17 DIAGNOSIS — I739 Peripheral vascular disease, unspecified: Secondary | ICD-10-CM | POA: Diagnosis not present

## 2013-12-17 DIAGNOSIS — Z86718 Personal history of other venous thrombosis and embolism: Secondary | ICD-10-CM | POA: Diagnosis not present

## 2013-12-20 DIAGNOSIS — Z86718 Personal history of other venous thrombosis and embolism: Secondary | ICD-10-CM | POA: Diagnosis not present

## 2013-12-20 DIAGNOSIS — R609 Edema, unspecified: Secondary | ICD-10-CM | POA: Diagnosis not present

## 2013-12-20 DIAGNOSIS — I10 Essential (primary) hypertension: Secondary | ICD-10-CM | POA: Diagnosis not present

## 2013-12-20 DIAGNOSIS — E785 Hyperlipidemia, unspecified: Secondary | ICD-10-CM | POA: Diagnosis not present

## 2013-12-20 DIAGNOSIS — I4891 Unspecified atrial fibrillation: Secondary | ICD-10-CM | POA: Diagnosis not present

## 2013-12-20 DIAGNOSIS — I739 Peripheral vascular disease, unspecified: Secondary | ICD-10-CM | POA: Diagnosis not present

## 2013-12-21 ENCOUNTER — Encounter: Payer: Self-pay | Admitting: Cardiology

## 2013-12-21 ENCOUNTER — Ambulatory Visit (INDEPENDENT_AMBULATORY_CARE_PROVIDER_SITE_OTHER): Payer: Medicare Other | Admitting: Cardiology

## 2013-12-21 VITALS — BP 120/60 | HR 79 | Ht 65.0 in | Wt 161.8 lb

## 2013-12-21 DIAGNOSIS — I10 Essential (primary) hypertension: Secondary | ICD-10-CM

## 2013-12-21 DIAGNOSIS — I739 Peripheral vascular disease, unspecified: Secondary | ICD-10-CM

## 2013-12-21 DIAGNOSIS — I519 Heart disease, unspecified: Secondary | ICD-10-CM

## 2013-12-21 DIAGNOSIS — I714 Abdominal aortic aneurysm, without rupture, unspecified: Secondary | ICD-10-CM | POA: Diagnosis not present

## 2013-12-21 DIAGNOSIS — I82401 Acute embolism and thrombosis of unspecified deep veins of right lower extremity: Secondary | ICD-10-CM

## 2013-12-21 DIAGNOSIS — I358 Other nonrheumatic aortic valve disorders: Secondary | ICD-10-CM

## 2013-12-21 DIAGNOSIS — I4891 Unspecified atrial fibrillation: Secondary | ICD-10-CM | POA: Diagnosis not present

## 2013-12-21 DIAGNOSIS — I82409 Acute embolism and thrombosis of unspecified deep veins of unspecified lower extremity: Secondary | ICD-10-CM | POA: Diagnosis not present

## 2013-12-21 DIAGNOSIS — I359 Nonrheumatic aortic valve disorder, unspecified: Secondary | ICD-10-CM

## 2013-12-21 DIAGNOSIS — I700 Atherosclerosis of aorta: Secondary | ICD-10-CM

## 2013-12-21 DIAGNOSIS — I482 Chronic atrial fibrillation, unspecified: Secondary | ICD-10-CM

## 2013-12-21 DIAGNOSIS — I5189 Other ill-defined heart diseases: Secondary | ICD-10-CM

## 2013-12-21 NOTE — Assessment & Plan Note (Signed)
Looks good. Stable. No change in medications.

## 2013-12-21 NOTE — Assessment & Plan Note (Addendum)
I think some of her dyspnea may be related to some diastolic dysfunction and rates her fibrillation. She is not overly symptomatic however. She does have edema up to his beyond the scope of any significance of her atrial fibrillation related diastolic failure. She is on adequate rate control with beta blocker. Not on ACE inhibitor or other activity she didn't, but is on diltiazem which does provide mild afterload reduction.  I do want to see what her current EF is with her long-standing A. fib. On her recent echocardiogram.  Plan: 2-D echo

## 2013-12-21 NOTE — Assessment & Plan Note (Signed)
Not seen on Dopplers in December 2014.

## 2013-12-21 NOTE — Assessment & Plan Note (Signed)
Recall any history of this diagnosis, nor does she. It was noted on CT scan back in December.  I like to order an abdominal aortic ultrasound for baseline with which to followup with future. It was only roughly 3-3.2 cm in diameter. Unlikely to be of major concern.

## 2013-12-21 NOTE — Assessment & Plan Note (Signed)
I have not heard a murmur on her before. This does raise a little concerned with her unexplained febrile illness with sepsis and shock. There have been talk about possibly doing an echocardiogram while she was in the hospital, but this was abandoned. With physical exam findings to suggest a possible murmur which was not heard during last exam, will order 2-D echocardiogram to assess the aortic valve for sclerosis/stenosis or even possibly a healed vegetation.

## 2013-12-21 NOTE — Progress Notes (Signed)
Patient ID: [name], female   DOB: [birth year], 80 y.o.   MRN: [id number] PCP: [name], MD  Clinic Note: Chief Complaint  Patient presents with  . ROV 6 months    In hospital twice for septic shock- Nov 26 (stayed for 4 days, went home for ~3 days and went back for 4 days). C/o lft sided chest pain, edema-rt leg.   HPI: [name] is a 80 y.o. female with a PMH below who presents today for six-month followup of her chronic atrial fibrillation. I saw her in August of 2014 following her right knee arthroplasty that was done in February.  She also has a history of chronic DVT with chronic edema associated with it.  Long ago she had an ischemic evaluation that was negative for coronary disease.  She's not had any anginal symptoms since.  She may have some mild diastolic heart failure symptoms related to her chronic AFib on occasion.  Apparently she had heart catheterization back in the 1980s which showed no significant disease.  Interval History:  Today she comes in for routine followup, mostly with the concern that she is still weak following prolonged hospitalization at the end of November 20 December 14 to be septic shock. Unfortunately there was no etiology figured to explain the cause of her septic shock.   She was initially admitted and then discharged and then acutely readmitted. No blood cultures were revealed to be positive and the workup essentially was negative. There were thoughts of doing echocardiogram to assess for possible pericarditis but when she became afebrile this was abandoned. Overall, since that timeframe, she has been feeling tired and not really wanting to do much in the way daily activities.  She got her edema relatively well controlled with a combination of furosemide and spironolactone, but after hospitalization one or both of these were discontinued, and her edema got significantly worse in early December after discharge. Socially she is intact on his  medications and his edema is improved notably. He does note occasional episodes of less so parasternal and inframammary chest discomfort that is sharp in nature. She's noticed it several times while driving, but never while walking. She actually had an episode today in clinic. It was somewhat mildly reproducible on exam.  Otherwise, the remainder of cardiac review of systems is as follows: Cardiovascular ROS: positive for - chest pain, dyspnea on exertion, irregular heartbeat and Fatigue. She does feel the irregular heartbeat off and on, but is not overly symptomatic with that. Mostly only notes it when she is going fast in which case it is associated with some dyspnea. negative for - loss of consciousness, murmur, orthopnea, paroxysmal nocturnal dyspnea, rapid heart rate or history of microscopic hematuria but none that she can tell.  No nosebleeds   Past Medical History  Diagnosis Date  . Coronary artery disease     Nonobstructive  . GERD (gastroesophageal reflux disease)   . Headache(784.0)   . High cholesterol   . DVT (deep venous thrombosis), right 2005    "dry blood; after 8 foot fall"  . Chronic bronchitis     "get it basically 3 times/yr"  . Blood transfusion 1960  . H/O hiatal hernia   . Rheumatoid arthritis(714.0)   . Concussion w/o coma 01/24/9416    Complicated by subarachnoid hemorrhage.  "even now has times when she's not able to comprehend" (05/08/12)  . Chronic atrial fibrillation     Anticoagulated with warfarin, rate control with diltiazem and  Toprol  . Pneumonia ~ 2010 AND 2013  . 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 Diverticulitis   . Diverticulosis   . External hemorrhoids   . Fatty liver   . AAA (abdominal aortic aneurysm)   . Edema of both legs     Chronic, thought to be secondary to DVTs  . Hx of bipolar disorder     Prior Cardiac Evaluation and Past Surgical History: Past Surgical History  Procedure  Laterality Date  . Cardiac catheterization - nonobstructive CAD   1980's  . Cystostomy w/ bladder biopsy  10/08/2013   Medications and Allergies Reviewed in Epic  History   Social History Narrative   She is a widowed mother of 55, grandmother of 75, great grandmother of 58. She does not   really get routine exercise. She quit smoking in 1983 -- after smoking a pack a day for 50 years. She does not smoke and does not use illicit drugs and does not drink.   ROS: A comprehensive Review of Systems - Negative except Pertinent positives noted above. Respiratory ROS: positive for - Intermittently feeling short of breath, but not associated with anything in particular negative for - hemoptysis, orthopnea, pleuritic pain, sputum changes, stridor, tachypnea or wheezing Musculoskeletal ROS: positive for - Swelling and discomfort in the right knee with more notable edema in that leg.  She does have bilateral edema.  PHYSICAL EXAM BP 120/60  Pulse 79  Ht 5\' 5"  (1.651 m)  Wt 161 lb 12.8 oz (73.392 kg)  BMI 26.92 kg/m2 General appearance: Very pleasant, somewhat confused elderly woman.  Very hard of hearing, in no acute distress.  A and O. x3 and does answer questions appropriately but is somewhat tangential with her thought process.  HEENT: Delta/AT, EOMI, MMM, anicteric sclera, mild arcus senilis Neck: no adenopathy, no carotid bruit, no JVD and supple, symmetrical, trachea midline Lungs: clear to auscultation bilaterally, normal percussion bilaterally and increased AP diameter, and mild interstitial sounds but normal effort. Heart: irregularly irregular rhythm, S1&S2 normal, no S3 or S4 and no rubs.  Soft 1/6 SEM at RUSB Nondisplaced PMI.   Abdomen: soft, non-tender; bowel sounds normal; no masses,  no organomegaly Extremities: edema 3+ with right lower charity, 2+ left, no ulcers, gangrene or trophic changes and has mild spider veins but no major/large varicose veins. Pulses: 2+ and symmetric bilateral  radial, but due to the extent of edema, but not really able to palpate pulses on the dorsalis pedis and posterior tibial pulses.  MWU:[id number] today: Yes Rate: 79 , Rhythm: Atrial fibrillation with RSR' versus incomplete Right Bundle Branch Block. Borderline criteria for LVH.  Nonspecific ST-T changes; no significant change from prior ECG  Recent Labs: Labs from December hospitalization reviewed on Epic  ASSESSMENT / PLAN: Overall relatively stable.  I reassured her that the chest discomfort said that she is feeling is musculoskeletal in nature. It just came on while moving off the table and to support herself with her hands.  Chronic atrial fibrillation Stable, rate control. No real symptoms. Did not have problems while in septic shock, I think most of her medications were held at that time. Anticoagulated on warfarin  DVT (deep venous thrombosis), right Not seen on Dopplers in December 2014.  Hypertension Looks good. Stable. No change in medications.  Left ventricular diastolic dysfunction, NYHA class 2 I think some of her dyspnea may be related to some diastolic dysfunction and rates her fibrillation. She  is not overly symptomatic however. She does have edema up to his beyond the scope of any significance of her atrial fibrillation related diastolic failure. She is on adequate rate control with beta blocker. Not on ACE inhibitor or other activity she didn't, but is on diltiazem which does provide mild afterload reduction.  I do want to see what her current EF is with her long-standing A. fib. On her recent echocardiogram.  Plan: 2-D echo  PVD, moderate bilat carotid disease Stable. No active symptoms.  Aortic heart murmur I have not heard a murmur on her before. This does raise a little concerned with her unexplained febrile illness with sepsis and shock. There have been talk about possibly doing an echocardiogram while she was in the hospital, but this was abandoned. With  physical exam findings to suggest a possible murmur which was not heard during last exam, will order 2-D echocardiogram to assess the aortic valve for sclerosis/stenosis or even possibly a healed vegetation.  AAA (abdominal aortic aneurysm) Recall any history of this diagnosis, nor does she. It was noted on CT scan back in December.  I like to order an abdominal aortic ultrasound for baseline with which to followup with future. It was only roughly 3-3.2 cm in diameter. Unlikely to be of major concern.   Followup: 6 months   W. Ellyn Hack, M.D., M.S. [facility] [address] [place], [place]  [address]  [phone] Pager # [phone]

## 2013-12-21 NOTE — Assessment & Plan Note (Signed)
Stable. No active symptoms.

## 2013-12-21 NOTE — Patient Instructions (Signed)
Your physician has requested that you have an echocardiogram. Echocardiography is a painless test that uses sound waves to create images of your heart. It provides your doctor with information about the size and shape of your heart and how well your heart's chambers and valves are working. This procedure takes approximately one hour. There are no restrictions for this procedure.  Your physician has requested that you have an abdominal aorta duplex. During this test, an ultrasound is used to evaluate the aorta. Allow 30 minutes for this exam. Do not eat after midnight the day before and avoid carbonated beverages   Your physician wants you to follow-up in 6 MONTH Dr Ellyn Hack. You will receive a reminder letter in the mail two months in advance. If you don't receive a letter, please call our office to schedule the follow-up appointment.

## 2013-12-21 NOTE — Assessment & Plan Note (Signed)
Stable, rate control. No real symptoms. Did not have problems while in septic shock, I think most of her medications were held at that time. Anticoagulated on warfarin

## 2013-12-24 DIAGNOSIS — I10 Essential (primary) hypertension: Secondary | ICD-10-CM | POA: Diagnosis not present

## 2013-12-24 DIAGNOSIS — I4891 Unspecified atrial fibrillation: Secondary | ICD-10-CM | POA: Diagnosis not present

## 2013-12-24 DIAGNOSIS — Z7901 Long term (current) use of anticoagulants: Secondary | ICD-10-CM | POA: Diagnosis not present

## 2013-12-26 DIAGNOSIS — E785 Hyperlipidemia, unspecified: Secondary | ICD-10-CM | POA: Diagnosis not present

## 2013-12-26 DIAGNOSIS — Z86718 Personal history of other venous thrombosis and embolism: Secondary | ICD-10-CM | POA: Diagnosis not present

## 2013-12-26 DIAGNOSIS — I10 Essential (primary) hypertension: Secondary | ICD-10-CM | POA: Diagnosis not present

## 2013-12-26 DIAGNOSIS — I4891 Unspecified atrial fibrillation: Secondary | ICD-10-CM | POA: Diagnosis not present

## 2013-12-26 DIAGNOSIS — R609 Edema, unspecified: Secondary | ICD-10-CM | POA: Diagnosis not present

## 2013-12-26 DIAGNOSIS — I739 Peripheral vascular disease, unspecified: Secondary | ICD-10-CM | POA: Diagnosis not present

## 2014-01-02 DIAGNOSIS — I714 Abdominal aortic aneurysm, without rupture, unspecified: Secondary | ICD-10-CM | POA: Diagnosis not present

## 2014-01-02 DIAGNOSIS — I4891 Unspecified atrial fibrillation: Secondary | ICD-10-CM | POA: Diagnosis not present

## 2014-01-02 DIAGNOSIS — I10 Essential (primary) hypertension: Secondary | ICD-10-CM | POA: Diagnosis not present

## 2014-01-02 DIAGNOSIS — R609 Edema, unspecified: Secondary | ICD-10-CM | POA: Diagnosis not present

## 2014-01-07 DIAGNOSIS — Z7901 Long term (current) use of anticoagulants: Secondary | ICD-10-CM | POA: Diagnosis not present

## 2014-01-07 DIAGNOSIS — I4891 Unspecified atrial fibrillation: Secondary | ICD-10-CM | POA: Diagnosis not present

## 2014-01-08 ENCOUNTER — Ambulatory Visit (HOSPITAL_BASED_OUTPATIENT_CLINIC_OR_DEPARTMENT_OTHER)
Admission: RE | Admit: 2014-01-08 | Discharge: 2014-01-08 | Disposition: A | Discharge status: Home / Self Care | Payer: Medicare Other | Source: Ambulatory Visit | Attending: Cardiology | Admitting: Cardiology

## 2014-01-08 ENCOUNTER — Ambulatory Visit (HOSPITAL_COMMUNITY)
Admission: RE | Admit: 2014-01-08 | Discharge: 2014-01-08 | Disposition: A | Discharge status: Home / Self Care | Payer: Medicare Other | Source: Ambulatory Visit | Attending: Cardiology | Admitting: Cardiology

## 2014-01-08 DIAGNOSIS — I714 Abdominal aortic aneurysm, without rupture, unspecified: Secondary | ICD-10-CM | POA: Insufficient documentation

## 2014-01-08 DIAGNOSIS — R609 Edema, unspecified: Secondary | ICD-10-CM | POA: Diagnosis not present

## 2014-01-08 DIAGNOSIS — I4891 Unspecified atrial fibrillation: Secondary | ICD-10-CM | POA: Diagnosis not present

## 2014-01-08 DIAGNOSIS — I739 Peripheral vascular disease, unspecified: Secondary | ICD-10-CM | POA: Diagnosis not present

## 2014-01-08 DIAGNOSIS — I359 Nonrheumatic aortic valve disorder, unspecified: Secondary | ICD-10-CM | POA: Diagnosis not present

## 2014-01-08 DIAGNOSIS — E785 Hyperlipidemia, unspecified: Secondary | ICD-10-CM | POA: Diagnosis not present

## 2014-01-08 DIAGNOSIS — I358 Other nonrheumatic aortic valve disorders: Secondary | ICD-10-CM

## 2014-01-08 DIAGNOSIS — I482 Chronic atrial fibrillation, unspecified: Secondary | ICD-10-CM

## 2014-01-08 DIAGNOSIS — Z86718 Personal history of other venous thrombosis and embolism: Secondary | ICD-10-CM | POA: Diagnosis not present

## 2014-01-08 DIAGNOSIS — I10 Essential (primary) hypertension: Secondary | ICD-10-CM | POA: Diagnosis not present

## 2014-01-08 NOTE — Progress Notes (Signed)
Aorta Duplex Completed.  , BS, RDMS, RVT  

## 2014-01-08 NOTE — Progress Notes (Signed)
2D Echo Performed 01/08/2014     , RCS  

## 2014-01-10 ENCOUNTER — Telehealth: Payer: Self-pay | Admitting: *Deleted

## 2014-01-10 NOTE — Telephone Encounter (Signed)
Message copied by Raiford Simmonds on Thu Jan 10, 2014 10:15 AM ------      Message from: Leonie Man      Created: Thu Jan 10, 2014  7:34 AM       Good news -- EF is in normal range 60-65%!Marland Kitchen      Murmur is as we hoped -- Aortic Sclerosis (calcification, but not narrowing) & not Stenosis.            Leonie Man, MD       ------

## 2014-01-10 NOTE — Telephone Encounter (Signed)
Spoke to patient.  Echo Result given . Verbalized understanding  

## 2014-01-21 DIAGNOSIS — I4891 Unspecified atrial fibrillation: Secondary | ICD-10-CM | POA: Diagnosis not present

## 2014-01-21 DIAGNOSIS — Z7901 Long term (current) use of anticoagulants: Secondary | ICD-10-CM | POA: Diagnosis not present

## 2014-01-21 DIAGNOSIS — I10 Essential (primary) hypertension: Secondary | ICD-10-CM | POA: Diagnosis not present

## 2014-02-08 ENCOUNTER — Telehealth: Payer: Self-pay | Admitting: *Deleted

## 2014-02-08 NOTE — Telephone Encounter (Signed)
Message copied by Hulan Saas on Fri Feb 08, 2014  9:02 AM ------      Message from: Hulan Saas      Created: Tue Oct 23, 2013 10:32 AM       Did patient 's EC scheduled 3 month OV with DB(Feb 2015) ------

## 2014-02-08 NOTE — Telephone Encounter (Signed)
Spoke with patient's daughter and schedule f/u OV on 02/26/14 at 2:00 PM.

## 2014-02-11 DIAGNOSIS — I4891 Unspecified atrial fibrillation: Secondary | ICD-10-CM | POA: Diagnosis not present

## 2014-02-11 DIAGNOSIS — Z7901 Long term (current) use of anticoagulants: Secondary | ICD-10-CM | POA: Diagnosis not present

## 2014-02-18 DIAGNOSIS — Z8551 Personal history of malignant neoplasm of bladder: Secondary | ICD-10-CM | POA: Diagnosis not present

## 2014-02-18 DIAGNOSIS — R3129 Other microscopic hematuria: Secondary | ICD-10-CM | POA: Diagnosis not present

## 2014-02-26 ENCOUNTER — Encounter: Payer: Self-pay | Admitting: Internal Medicine

## 2014-02-26 ENCOUNTER — Ambulatory Visit (INDEPENDENT_AMBULATORY_CARE_PROVIDER_SITE_OTHER): Payer: Medicare Other | Admitting: Internal Medicine

## 2014-02-26 VITALS — BP 118/70 | HR 70 | Ht 65.0 in | Wt 163.4 lb

## 2014-02-26 DIAGNOSIS — K5732 Diverticulitis of large intestine without perforation or abscess without bleeding: Secondary | ICD-10-CM

## 2014-02-26 NOTE — Patient Instructions (Signed)
High-Fiber Diet Fiber is found in fruits, vegetables, and grains. A high-fiber diet encourages the addition of more whole grains, legumes, fruits, and vegetables in your diet. The recommended amount of fiber for adult males is 38 g per day. For adult females, it is 25 g per day. Pregnant and lactating women should get 28 g of fiber per day. If you have a digestive or bowel problem, ask your caregiver for advice before adding high-fiber foods to your diet. Eat a variety of high-fiber foods instead of only a select few type of foods.  PURPOSE  To increase stool bulk.  To make bowel movements more regular to prevent constipation.  To lower cholesterol.  To prevent overeating. WHEN IS THIS DIET USED?  It may be used if you have constipation and hemorrhoids.  It may be used if you have uncomplicated diverticulosis (intestine condition) and irritable bowel syndrome.  It may be used if you need help with weight management.  It may be used if you want to add it to your diet as a protective measure against atherosclerosis, diabetes, and cancer. SOURCES OF FIBER  Whole-grain breads and cereals.  Fruits, such as apples, oranges, bananas, berries, prunes, and pears.  Vegetables, such as green peas, carrots, sweet potatoes, beets, broccoli, cabbage, spinach, and artichokes.  Legumes, such split peas, soy, lentils.  Almonds. FIBER CONTENT IN FOODS Starches and Grains / Dietary Fiber (g)  Cheerios, 1 cup / 3 g  Corn Flakes cereal, 1 cup / 0.7 g  Rice crispy treat cereal, 1 cup / 0.3 g  Instant oatmeal (cooked),  cup / 2 g  Frosted wheat cereal, 1 cup / 5.1 g  Brown, long-grain rice (cooked), 1 cup / 3.5 g  White, long-grain rice (cooked), 1 cup / 0.6 g  Enriched macaroni (cooked), 1 cup / 2.5 g Legumes / Dietary Fiber (g)  Baked beans (canned, plain, or vegetarian),  cup / 5.2 g  Kidney beans (canned),  cup / 6.8 g  Pinto beans (cooked),  cup / 5.5 g Breads and Crackers  / Dietary Fiber (g)  Plain or honey graham crackers, 2 squares / 0.7 g  Saltine crackers, 3 squares / 0.3 g  Plain, salted pretzels, 10 pieces / 1.8 g  Whole-wheat bread, 1 slice / 1.9 g  White bread, 1 slice / 0.7 g  Raisin bread, 1 slice / 1.2 g  Plain bagel, 3 oz / 2 g  Flour tortilla, 1 oz / 0.9 g  Corn tortilla, 1 small / 1.5 g  Hamburger or hotdog bun, 1 small / 0.9 g Fruits / Dietary Fiber (g)  Apple with skin, 1 medium / 4.4 g  Sweetened applesauce,  cup / 1.5 g  Banana,  medium / 1.5 g  Grapes, 10 grapes / 0.4 g  Orange, 1 small / 2.3 g  Raisin, 1.5 oz / 1.6 g  Melon, 1 cup / 1.4 g Vegetables / Dietary Fiber (g)  Green beans (canned),  cup / 1.3 g  Carrots (cooked),  cup / 2.3 g  Broccoli (cooked),  cup / 2.8 g  Peas (cooked),  cup / 4.4 g  Mashed potatoes,  cup / 1.6 g  Lettuce, 1 cup / 0.5 g  Corn (canned),  cup / 1.6 g  Tomato,  cup / 1.1 g Document Released: 11/29/2005 Document Revised: 05/30/2012 Document Reviewed: 03/02/2012 [facility] Patient Information 2014 [place], [place].  CC:Dr [name]

## 2014-02-26 NOTE — Progress Notes (Signed)
[name] [birth year] [id number]  Note: This dictation was prepared with Dragon digital system. Any transcriptional errors that result from this procedure are unintentional.   History of Present Illness:  This is a 80 year old white female with chronic diverticulitis, left lower quadrant abdominal pain and question of a coloenteric fistula requiring hospitalization in June 2014. She is not a  surgical candidate because she has an infrarenal aortic aneurysm measuring 3.2 cm. She has a history of atrial fibrillation and has required Coumadin. She had bladder cancer and a total knee replacement in February 2014. A repeat CT scan of the abdomen in November 2014 confirmed linear density extending between the proximal sigmoid colon and adjacent anterior left lower quadrant. Her last colonoscopy in December 2008 confirmed severe diverticulosis. She is doing about the same. She has gained some weight. Her diet has been high in carbohydrates.    Past Medical History  Diagnosis Date  . Coronary artery disease     Nonobstructive  . GERD (gastroesophageal reflux disease)   . Headache(784.0)   . High cholesterol   . DVT (deep venous thrombosis), right 2005    "dry blood; after 8 foot fall"  . Chronic bronchitis     "get it basically 3 times/yr"  . Blood transfusion 1960  . H/O hiatal hernia   . Rheumatoid arthritis(714.0)   . Concussion w/o coma 5/63/8756    Complicated by subarachnoid hemorrhage.  "even now has times when she's not able to comprehend" (05/08/12)  . Chronic atrial fibrillation     Anticoagulated with warfarin, rate control with diltiazem and Toprol  . Pneumonia ~ 2010 AND 2013  . 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 Diverticulitis   . Diverticulosis   . External hemorrhoids   . Fatty liver   . AAA (abdominal aortic aneurysm)   . Edema of both legs     Chronic, thought to be secondary to DVTs  . Hx of bipolar  disorder     Past Surgical History  Procedure Laterality Date  . Transurethral resection of bladder tumor  11/11/2011    Procedure: TRANSURETHRAL RESECTION OF BLADDER TUMOR (TURBT);  Surgeon: Malka So;  Location: [facility];  Service: Urology;  Laterality: N/A;  Cysto, Bladder Biopsy, TURBT with Gyrus,   . Cystoscopy  11/11/2011    Procedure: CYSTOSCOPY;  Surgeon: Malka So;  Location: [facility];  Service: Urology;  Laterality: N/A;  . Bladder lift  YRS AGO  . Bladder cancer  2010; 2009    "for tumors on surface of bladder"  . Knee arthroscopy  2005    right; S/P fall  . Cataract extraction w/ intraocular lens  implant, bilateral Bilateral 1992  . Back surgery  1976 UPPER BACK    tumor removed-benign  . Tonsillectomy  1952  . Breast cyst excision  YRS AGO    2 cysts removed-left breast-benign  . Cardiac catheterization  1980's  . Abdominal hysterectomy  1960    partial hysterectomy  . Left thumb  benign tumor removed  1980'S  . Total knee arthroplasty Right 02/05/2013    Procedure: TOTAL KNEE ARTHROPLASTY;  Surgeon: Mauri Pole, MD;  Location: [facility];  Service: Orthopedics;  Laterality: Right;  . Cystostomy w/ bladder biopsy  10/08/2013  . Lower extremity venous doppler  11/10/2013    No DVT or superficial thrombus enlarged inguinal lymph node noted in the right. No Baker's cyst.    Allergies  Allergen  Reactions  . Contrast Media [Iodinated Diagnostic Agents] Hives  . Levaquin [Levofloxacin] Itching and Rash  . Penicillins Rash    "haven't had it in years"  . Vancomycin Itching and Rash    Family history and social history have been reviewed.  Review of Systems: Denies constipation  The remainder of the 10 point ROS is negative except as outlined in the H&P  Physical Exam: General Appearance Well developed, in no distress Eyes  Non icteric  HEENT  Non traumatic, normocephalic  Mouth No lesion, tongue papillated, no cheilosis Neck Supple without adenopathy, thyroid  not enlarged, no carotid bruits, no JVD Lungs Clear to auscultation bilaterally COR Normal S1, normal S2, regular rhythm, no murmur, quiet precordium Abdomen protuberant abdomen with normoactive bowel sounds. Tenderness along the sigmoid colon. No rebound or mass Rectal not done Extremities  bilateral pedal edema Skin No lesions Neurological Alert and oriented x 3 Psychological Normal mood and affect  Assessment and Plan:   Problem #1 Symptomatic diverticulosis and question of coloenteric fistula recently which is essentially asymptomatic. She is not a surgical candidate. I have discussed a high-fiber diet with her. She will continue Metamucil 1 teaspoon daily. At this time, no further evaluation is planned.    Delfin Edis 02/26/2014

## 2014-03-11 DIAGNOSIS — Z7901 Long term (current) use of anticoagulants: Secondary | ICD-10-CM | POA: Diagnosis not present

## 2014-03-11 DIAGNOSIS — I4891 Unspecified atrial fibrillation: Secondary | ICD-10-CM | POA: Diagnosis not present

## 2014-04-02 DIAGNOSIS — I4891 Unspecified atrial fibrillation: Secondary | ICD-10-CM | POA: Diagnosis not present

## 2014-04-02 DIAGNOSIS — Z7901 Long term (current) use of anticoagulants: Secondary | ICD-10-CM | POA: Diagnosis not present

## 2014-04-02 DIAGNOSIS — I10 Essential (primary) hypertension: Secondary | ICD-10-CM | POA: Diagnosis not present

## 2014-04-05 DIAGNOSIS — N3000 Acute cystitis without hematuria: Secondary | ICD-10-CM | POA: Diagnosis not present

## 2014-04-09 ENCOUNTER — Other Ambulatory Visit: Payer: Medicare Other

## 2014-04-09 ENCOUNTER — Encounter: Payer: Self-pay | Admitting: Family Medicine

## 2014-04-09 ENCOUNTER — Ambulatory Visit (INDEPENDENT_AMBULATORY_CARE_PROVIDER_SITE_OTHER): Payer: Medicare Other | Admitting: Family Medicine

## 2014-04-09 VITALS — BP 109/50 | HR 69 | Temp 97.1°F | Ht 65.0 in | Wt 164.0 lb

## 2014-04-09 DIAGNOSIS — Z09 Encounter for follow-up examination after completed treatment for conditions other than malignant neoplasm: Secondary | ICD-10-CM | POA: Diagnosis not present

## 2014-04-09 DIAGNOSIS — Z79899 Other long term (current) drug therapy: Secondary | ICD-10-CM

## 2014-04-09 DIAGNOSIS — I4891 Unspecified atrial fibrillation: Secondary | ICD-10-CM

## 2014-04-09 NOTE — Progress Notes (Signed)
   Subjective:    Patient ID: [name], female    DOB: 10/06/1921, 80 y.o.   MRN: [id number]  HPI  This 80 y.o. female presents for evaluation of getting INR checked.  She is seeing urologist who Put her on abx's and she is here to get her INR checked and they will adjust her INR.    Review of Systems    No chest pain, SOB, HA, dizziness, vision change, N/V, diarrhea, constipation, dysuria, urinary urgency or frequency, myalgias, arthralgias or rash.  Objective:   Physical Exam  Vital signs noted  Well developed well nourished female.  HEENT - Head atraumatic Normocephalic                Eyes - PERRLA, Conjuctiva - clear Sclera- Clear EOMI                Ears - EAC's Wnl TM's Wnl Gross Hearing WNL                Throat - oropharanx wnl Respiratory - Lungs CTA bilateral Cardiac - Irregular rate and rhythm  S1 and S2 without murmur GI - Abdomen soft Nontender and bowel sounds active x 4      Assessment & Plan:  Encounter for long-term (current) use of other medications - Plan: Protime-INR  Atrial fibrillation - Results of INR will be sent to cardiology for review and management of coumadin  Lysbeth Penner FNP

## 2014-04-10 LAB — PROTIME-INR
INR: 3.5 — ABNORMAL HIGH (ref 0.8–1.2)
Prothrombin Time: 36.8 s — ABNORMAL HIGH (ref 9.1–12.0)

## 2014-04-16 DIAGNOSIS — I4891 Unspecified atrial fibrillation: Secondary | ICD-10-CM | POA: Diagnosis not present

## 2014-04-16 DIAGNOSIS — Z7901 Long term (current) use of anticoagulants: Secondary | ICD-10-CM | POA: Diagnosis not present

## 2014-04-22 DIAGNOSIS — E7800 Pure hypercholesterolemia, unspecified: Secondary | ICD-10-CM | POA: Diagnosis not present

## 2014-04-22 DIAGNOSIS — I10 Essential (primary) hypertension: Secondary | ICD-10-CM | POA: Diagnosis not present

## 2014-04-22 DIAGNOSIS — I4891 Unspecified atrial fibrillation: Secondary | ICD-10-CM | POA: Diagnosis not present

## 2014-04-22 DIAGNOSIS — R7309 Other abnormal glucose: Secondary | ICD-10-CM | POA: Diagnosis not present

## 2014-05-14 DIAGNOSIS — Z7901 Long term (current) use of anticoagulants: Secondary | ICD-10-CM | POA: Diagnosis not present

## 2014-05-14 DIAGNOSIS — I4891 Unspecified atrial fibrillation: Secondary | ICD-10-CM | POA: Diagnosis not present

## 2014-05-14 DIAGNOSIS — R21 Rash and other nonspecific skin eruption: Secondary | ICD-10-CM | POA: Diagnosis not present

## 2014-06-20 DIAGNOSIS — Z7901 Long term (current) use of anticoagulants: Secondary | ICD-10-CM | POA: Diagnosis not present

## 2014-06-20 DIAGNOSIS — I4891 Unspecified atrial fibrillation: Secondary | ICD-10-CM | POA: Diagnosis not present

## 2014-06-24 DIAGNOSIS — G5780 Other specified mononeuropathies of unspecified lower limb: Secondary | ICD-10-CM | POA: Diagnosis not present

## 2014-06-24 DIAGNOSIS — Z96659 Presence of unspecified artificial knee joint: Secondary | ICD-10-CM | POA: Diagnosis not present

## 2014-07-02 ENCOUNTER — Ambulatory Visit: Payer: Medicare Other | Attending: Orthopedic Surgery | Admitting: Physical Therapy

## 2014-07-02 DIAGNOSIS — Z96659 Presence of unspecified artificial knee joint: Secondary | ICD-10-CM | POA: Diagnosis not present

## 2014-07-02 DIAGNOSIS — IMO0001 Reserved for inherently not codable concepts without codable children: Secondary | ICD-10-CM | POA: Diagnosis not present

## 2014-07-02 DIAGNOSIS — Z8551 Personal history of malignant neoplasm of bladder: Secondary | ICD-10-CM | POA: Diagnosis not present

## 2014-07-02 DIAGNOSIS — R5381 Other malaise: Secondary | ICD-10-CM | POA: Insufficient documentation

## 2014-07-02 DIAGNOSIS — M25569 Pain in unspecified knee: Secondary | ICD-10-CM | POA: Diagnosis not present

## 2014-07-02 DIAGNOSIS — IMO0002 Reserved for concepts with insufficient information to code with codable children: Secondary | ICD-10-CM | POA: Diagnosis not present

## 2014-07-04 ENCOUNTER — Encounter: Payer: Medicare Other | Admitting: Physical Therapy

## 2014-07-04 DIAGNOSIS — I4891 Unspecified atrial fibrillation: Secondary | ICD-10-CM | POA: Diagnosis not present

## 2014-07-04 DIAGNOSIS — Z7901 Long term (current) use of anticoagulants: Secondary | ICD-10-CM | POA: Diagnosis not present

## 2014-07-08 ENCOUNTER — Ambulatory Visit: Payer: Medicare Other | Admitting: Physical Therapy

## 2014-07-08 DIAGNOSIS — IMO0002 Reserved for concepts with insufficient information to code with codable children: Secondary | ICD-10-CM | POA: Diagnosis not present

## 2014-07-08 DIAGNOSIS — R5381 Other malaise: Secondary | ICD-10-CM | POA: Diagnosis not present

## 2014-07-08 DIAGNOSIS — M25569 Pain in unspecified knee: Secondary | ICD-10-CM | POA: Diagnosis not present

## 2014-07-08 DIAGNOSIS — Z96659 Presence of unspecified artificial knee joint: Secondary | ICD-10-CM | POA: Diagnosis not present

## 2014-07-08 DIAGNOSIS — IMO0001 Reserved for inherently not codable concepts without codable children: Secondary | ICD-10-CM | POA: Diagnosis not present

## 2014-07-08 DIAGNOSIS — Z8551 Personal history of malignant neoplasm of bladder: Secondary | ICD-10-CM | POA: Diagnosis not present

## 2014-07-09 ENCOUNTER — Emergency Department (HOSPITAL_COMMUNITY)
Admission: EM | Admit: 2014-07-09 | Discharge: 2014-07-09 | Disposition: A | Discharge status: Home / Self Care | Payer: Medicare Other | Attending: Emergency Medicine | Admitting: Emergency Medicine

## 2014-07-09 ENCOUNTER — Encounter (HOSPITAL_COMMUNITY): Payer: Self-pay | Admitting: Emergency Medicine

## 2014-07-09 ENCOUNTER — Emergency Department (HOSPITAL_COMMUNITY): Payer: Medicare Other

## 2014-07-09 DIAGNOSIS — I4891 Unspecified atrial fibrillation: Secondary | ICD-10-CM | POA: Diagnosis not present

## 2014-07-09 DIAGNOSIS — Z7901 Long term (current) use of anticoagulants: Secondary | ICD-10-CM | POA: Insufficient documentation

## 2014-07-09 DIAGNOSIS — Z86718 Personal history of other venous thrombosis and embolism: Secondary | ICD-10-CM | POA: Diagnosis not present

## 2014-07-09 DIAGNOSIS — Z8701 Personal history of pneumonia (recurrent): Secondary | ICD-10-CM | POA: Diagnosis not present

## 2014-07-09 DIAGNOSIS — Y9289 Other specified places as the place of occurrence of the external cause: Secondary | ICD-10-CM | POA: Diagnosis not present

## 2014-07-09 DIAGNOSIS — Z8551 Personal history of malignant neoplasm of bladder: Secondary | ICD-10-CM | POA: Diagnosis not present

## 2014-07-09 DIAGNOSIS — W208XXA Other cause of strike by thrown, projected or falling object, initial encounter: Secondary | ICD-10-CM | POA: Diagnosis not present

## 2014-07-09 DIAGNOSIS — E7800 Pure hypercholesterolemia, unspecified: Secondary | ICD-10-CM | POA: Diagnosis not present

## 2014-07-09 DIAGNOSIS — Z87891 Personal history of nicotine dependence: Secondary | ICD-10-CM | POA: Diagnosis not present

## 2014-07-09 DIAGNOSIS — Y939 Activity, unspecified: Secondary | ICD-10-CM | POA: Insufficient documentation

## 2014-07-09 DIAGNOSIS — S0990XA Unspecified injury of head, initial encounter: Secondary | ICD-10-CM | POA: Insufficient documentation

## 2014-07-09 DIAGNOSIS — Z880 Allergy status to penicillin: Secondary | ICD-10-CM | POA: Insufficient documentation

## 2014-07-09 DIAGNOSIS — I2510 Atherosclerotic heart disease of native coronary artery without angina pectoris: Secondary | ICD-10-CM | POA: Insufficient documentation

## 2014-07-09 DIAGNOSIS — K219 Gastro-esophageal reflux disease without esophagitis: Secondary | ICD-10-CM | POA: Insufficient documentation

## 2014-07-09 DIAGNOSIS — M069 Rheumatoid arthritis, unspecified: Secondary | ICD-10-CM | POA: Insufficient documentation

## 2014-07-09 DIAGNOSIS — Z8709 Personal history of other diseases of the respiratory system: Secondary | ICD-10-CM | POA: Insufficient documentation

## 2014-07-09 DIAGNOSIS — Z8659 Personal history of other mental and behavioral disorders: Secondary | ICD-10-CM | POA: Insufficient documentation

## 2014-07-09 DIAGNOSIS — Z79899 Other long term (current) drug therapy: Secondary | ICD-10-CM | POA: Insufficient documentation

## 2014-07-09 LAB — CBC WITH DIFFERENTIAL/PLATELET
BASOS ABS: 0 10*3/uL (ref 0.0–0.1)
Basophils Relative: 0 % (ref 0–1)
EOS PCT: 13 % — AB (ref 0–5)
Eosinophils Absolute: 0.6 10*3/uL (ref 0.0–0.7)
HCT: 39.1 % (ref 36.0–46.0)
Hemoglobin: 13.1 g/dL (ref 12.0–15.0)
LYMPHS PCT: 32 % (ref 12–46)
Lymphs Abs: 1.5 10*3/uL (ref 0.7–4.0)
MCH: 32.3 pg (ref 26.0–34.0)
MCHC: 33.5 g/dL (ref 30.0–36.0)
MCV: 96.5 fL (ref 78.0–100.0)
Monocytes Absolute: 0.4 10*3/uL (ref 0.1–1.0)
Monocytes Relative: 8 % (ref 3–12)
NEUTROS ABS: 2.2 10*3/uL (ref 1.7–7.7)
NEUTROS PCT: 47 % (ref 43–77)
PLATELETS: 209 10*3/uL (ref 150–400)
RBC: 4.05 MIL/uL (ref 3.87–5.11)
RDW: 12.7 % (ref 11.5–15.5)
WBC: 4.8 10*3/uL (ref 4.0–10.5)

## 2014-07-09 LAB — BASIC METABOLIC PANEL
Anion gap: 14 (ref 5–15)
BUN: 9 mg/dL (ref 6–23)
CALCIUM: 9.1 mg/dL (ref 8.4–10.5)
CO2: 25 meq/L (ref 19–32)
Chloride: 101 mEq/L (ref 96–112)
Creatinine, Ser: 0.68 mg/dL (ref 0.50–1.10)
GFR calc Af Amer: 87 mL/min — ABNORMAL LOW (ref >90)
GFR calc non Af Amer: 75 mL/min — ABNORMAL LOW (ref >90)
Glucose, Bld: 94 mg/dL (ref 70–99)
POTASSIUM: 4.2 meq/L (ref 3.7–5.3)
Sodium: 140 mEq/L (ref 137–147)

## 2014-07-09 LAB — PROTIME-INR
INR: 2.97 — AB (ref 0.00–1.49)
PROTHROMBIN TIME: 30.9 s — AB (ref 11.6–15.2)

## 2014-07-09 NOTE — ED Notes (Signed)
Patient to CT.

## 2014-07-09 NOTE — ED Notes (Signed)
Per pt she was in her closet this am and a globe light fell on her head. sts she is on coumadin. No bleeding.

## 2014-07-09 NOTE — ED Provider Notes (Signed)
CSN: [id number]     Arrival date & time 07/09/14  1002 History   First MD Initiated Contact with Patient 07/09/14 1037     Chief Complaint  Patient presents with  . Head Injury     (Consider location/radiation/quality/duration/timing/severity/associated sxs/prior Treatment) Patient is a 80 y.o. female presenting with head injury.  Head Injury Head/neck injury location: vertex. Time since incident:  2 hours Mechanism of injury: direct blow   Mechanism of injury comment:  A glass light fixture fell on her head Pain details:    Quality:  Dull   Severity:  Mild   Timing:  Constant   Progression:  Unchanged Chronicity:  New Relieved by:  Nothing Worsened by:  Nothing tried Associated symptoms: no blurred vision, no focal weakness, no loss of consciousness, no nausea, no neck pain and no vomiting   Associated symptoms comment:  Dazed   Past Medical History  Diagnosis Date  . Coronary artery disease     Nonobstructive  . GERD (gastroesophageal reflux disease)   . Headache(784.0)   . High cholesterol   . DVT (deep venous thrombosis), right 2005    "dry blood; after 8 foot fall"  . Chronic bronchitis     "get it basically 3 times/yr"  . Blood transfusion 1960  . H/O hiatal hernia   . Rheumatoid arthritis(714.0)   . Concussion w/o coma 3/61/4431    Complicated by subarachnoid hemorrhage.  "even now has times when she's not able to comprehend" (05/08/12)  . Chronic atrial fibrillation     Anticoagulated with warfarin, rate control with diltiazem and Toprol  . Pneumonia ~ 2010 AND 2013  . 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 Diverticulitis   . Diverticulosis   . External hemorrhoids   . Fatty liver   . AAA (abdominal aortic aneurysm)   . Edema of both legs     Chronic, thought to be secondary to DVTs  . Hx of bipolar disorder    Past Surgical History  Procedure Laterality Date  . Transurethral resection of bladder tumor   11/11/2011    Procedure: TRANSURETHRAL RESECTION OF BLADDER TUMOR (TURBT);  Surgeon: Malka So;  Location: [facility];  Service: Urology;  Laterality: N/A;  Cysto, Bladder Biopsy, TURBT with Gyrus,   . Cystoscopy  11/11/2011    Procedure: CYSTOSCOPY;  Surgeon: Malka So;  Location: [facility];  Service: Urology;  Laterality: N/A;  . Bladder lift  YRS AGO  . Bladder cancer  2010; 2009    "for tumors on surface of bladder"  . Knee arthroscopy  2005    right; S/P fall  . Cataract extraction w/ intraocular lens  implant, bilateral Bilateral 1992  . Back surgery  1976 UPPER BACK    tumor removed-benign  . Tonsillectomy  1952  . Breast cyst excision  YRS AGO    2 cysts removed-left breast-benign  . Cardiac catheterization  1980's  . Abdominal hysterectomy  1960    partial hysterectomy  . Left thumb  benign tumor removed  1980'S  . Total knee arthroplasty Right 02/05/2013    Procedure: TOTAL KNEE ARTHROPLASTY;  Surgeon: Mauri Pole, MD;  Location: [facility];  Service: Orthopedics;  Laterality: Right;  . Cystostomy w/ bladder biopsy  10/08/2013  . Lower extremity venous doppler  11/10/2013    No DVT or superficial thrombus enlarged inguinal lymph node noted in the right. No Baker's cyst.   Family History  Problem  Relation Age of Onset  . Anesthesia problems Daughter   . Prostate cancer Brother   . Breast cancer Other     neice  . Heart disease Brother   . Colon cancer Neg Hx    History  Substance Use Topics  . Smoking status: Former Smoker -- 1.00 packs/day for 50 years    Types: Cigarettes    Quit date: 11/07/1982  . Smokeless tobacco: Never Used  . Alcohol Use: 4.2 oz/week    7 Glasses of wine per week   OB History   Grav Para Term Preterm Abortions TAB SAB Ect Mult Living                 Review of Systems  Eyes: Negative for blurred vision.  Gastrointestinal: Negative for nausea and vomiting.  Musculoskeletal: Negative for neck pain.  Neurological: Negative for focal  weakness and loss of consciousness.  All other systems reviewed and are negative.     Allergies  Contrast media; Levaquin; Penicillins; and Vancomycin  Home Medications   Prior to Admission medications   Medication Sig Start Date End Date Taking? Authorizing Provider  acetaminophen (TYLENOL) 500 MG tablet Take 1,000 mg by mouth every 6 (six) hours as needed for pain.   Yes Historical Provider, MD  calcium carbonate (OS-CAL - DOSED IN MG OF ELEMENTAL CALCIUM) 1250 MG tablet Take 1 tablet by mouth every morning.    Yes Historical Provider, MD  cholecalciferol (VITAMIN D) 1000 UNITS tablet Take 1,000 Units by mouth daily.   Yes Historical Provider, MD  Cinnamon 500 MG capsule Take 1,000 mg by mouth daily.   Yes Historical Provider, MD  diltiazem (CARDIZEM CD) 240 MG 24 hr capsule Take 240 mg by mouth daily. 11/02/13  Yes Historical Provider, MD  furosemide (LASIX) 40 MG tablet Take 40-80 mg by mouth daily. 2 (80mg ) tablets on mon,wed and Friday. All other days 1 tablet (40mg )   Yes Historical Provider, MD  metoprolol (TOPROL-XL) 50 MG 24 hr tablet Take 50 mg by mouth at bedtime.    Yes Historical Provider, MD  Multiple Vitamins-Minerals (MULTIVITAMINS THER. W/MINERALS) TABS Take 1 tablet by mouth daily.    Yes Historical Provider, MD  Omega-3 Fatty Acids (FISH OIL) 1000 MG CAPS Take 1,000 mg by mouth daily.   Yes Historical Provider, MD  omeprazole (PRILOSEC) 20 MG capsule Take 20 mg by mouth 2 (two) times daily.   Yes Historical Provider, MD  psyllium (METAMUCIL) 58.6 % powder Take 1 packet by mouth 2 (two) times daily.    Yes Historical Provider, MD  rosuvastatin (CRESTOR) 5 MG tablet Take 5 mg by mouth every evening.    Yes Historical Provider, MD  spironolactone (ALDACTONE) 25 MG tablet Take 25 mg by mouth every morning.   Yes Historical Provider, MD  warfarin (COUMADIN) 5 MG tablet Take 5-7.5 mg by mouth daily. 1 tablet(5mg ) on mon,wed,friday. All other days take 1.5 tablets (7.5mg )  05/23/14  Yes Historical Provider, MD   BP 122/46  Pulse 68  Temp(Src) 98.3 F (36.8 C) (Oral)  Resp 18  SpO2 98% Physical Exam  Nursing note and vitals reviewed. Constitutional: She is oriented to person, place, and time. She appears well-developed and well-nourished. No distress.  HENT:  Head: Normocephalic and atraumatic. Head is without raccoon's eyes and without Battle's sign.  Nose: Nose normal.  No lacerations  Eyes: Conjunctivae and EOM are normal. Pupils are equal, round, and reactive to light. No scleral icterus.  Neck: No spinous  process tenderness and no muscular tenderness present.  Cardiovascular: Normal rate, regular rhythm, normal heart sounds and intact distal pulses.   No murmur heard. Pulmonary/Chest: Effort normal and breath sounds normal. She has no rales. She exhibits no tenderness.  Abdominal: Soft. There is no tenderness. There is no rebound and no guarding.  Musculoskeletal: Normal range of motion. She exhibits no edema and no tenderness.       Thoracic back: She exhibits no tenderness and no bony tenderness.       Lumbar back: She exhibits no tenderness and no bony tenderness.  No evidence of trauma to extremities, except as noted.  2+ distal pulses.    Neurological: She is alert and oriented to person, place, and time.  Skin: Skin is warm and dry. No rash noted.  Psychiatric: She has a normal mood and affect.    ED Course  Procedures (including critical care time) Labs Review Labs Reviewed  CBC WITH DIFFERENTIAL - Abnormal; Notable for the following:    Eosinophils Relative 13 (*)    All other components within normal limits  BASIC METABOLIC PANEL - Abnormal; Notable for the following:    GFR calc non Af Amer 75 (*)    GFR calc Af Amer 87 (*)    All other components within normal limits  PROTIME-INR - Abnormal; Notable for the following:    Prothrombin Time 30.9 (*)    INR 2.97 (*)    All other components within normal limits    Imaging  Review Ct Head Wo Contrast  07/09/2014   CLINICAL DATA:  Head injury  EXAM: CT HEAD WITHOUT CONTRAST  TECHNIQUE: Contiguous axial images were obtained from the base of the skull through the vertex without intravenous contrast.  COMPARISON:  11/22/2013  FINDINGS: No skull fracture is noted. No intracranial hemorrhage, mass effect or midline shift. Paranasal sinuses and mastoid air cells are unremarkable. Mild cerebral atrophy. Mild atherosclerotic calcifications of carotid siphon.  No acute cortical infarction. No mass lesion is noted on this unenhanced scan.  IMPRESSION: No acute intracranial abnormality.  Mild cerebral atrophy.   Electronically Signed   By: Lahoma Crocker M.D.   On: 07/09/2014 13:06  All radiology studies independently viewed by me.      EKG Interpretation None      MDM   Final diagnoses:  Closed head injury, initial encounter    80 yo female on coumadin with head injury.  A heavy glass globe fell on her head.  She has been a little dazed since then.  She initially seemed off balance and staggered.  She did not lose consciousness.    CT negative.  INR therapeutic.  Pt remained well appearing.  Plan ambulate and DC.   Ambulated well.  DC home.  Houston Siren III, MD 07/09/14 [phone]

## 2014-07-11 ENCOUNTER — Ambulatory Visit: Payer: Medicare Other | Admitting: Physical Therapy

## 2014-07-11 DIAGNOSIS — Z96659 Presence of unspecified artificial knee joint: Secondary | ICD-10-CM | POA: Diagnosis not present

## 2014-07-11 DIAGNOSIS — IMO0001 Reserved for inherently not codable concepts without codable children: Secondary | ICD-10-CM | POA: Diagnosis not present

## 2014-07-11 DIAGNOSIS — Z8551 Personal history of malignant neoplasm of bladder: Secondary | ICD-10-CM | POA: Diagnosis not present

## 2014-07-11 DIAGNOSIS — R5381 Other malaise: Secondary | ICD-10-CM | POA: Diagnosis not present

## 2014-07-11 DIAGNOSIS — M25569 Pain in unspecified knee: Secondary | ICD-10-CM | POA: Diagnosis not present

## 2014-07-11 DIAGNOSIS — IMO0002 Reserved for concepts with insufficient information to code with codable children: Secondary | ICD-10-CM | POA: Diagnosis not present

## 2014-07-15 ENCOUNTER — Ambulatory Visit: Payer: Medicare Other | Attending: Orthopedic Surgery | Admitting: Physical Therapy

## 2014-07-15 DIAGNOSIS — Z96659 Presence of unspecified artificial knee joint: Secondary | ICD-10-CM | POA: Insufficient documentation

## 2014-07-15 DIAGNOSIS — Z8551 Personal history of malignant neoplasm of bladder: Secondary | ICD-10-CM | POA: Diagnosis not present

## 2014-07-15 DIAGNOSIS — R5381 Other malaise: Secondary | ICD-10-CM | POA: Diagnosis not present

## 2014-07-15 DIAGNOSIS — IMO0002 Reserved for concepts with insufficient information to code with codable children: Secondary | ICD-10-CM | POA: Insufficient documentation

## 2014-07-15 DIAGNOSIS — M25569 Pain in unspecified knee: Secondary | ICD-10-CM | POA: Insufficient documentation

## 2014-07-15 DIAGNOSIS — IMO0001 Reserved for inherently not codable concepts without codable children: Secondary | ICD-10-CM | POA: Diagnosis not present

## 2014-07-18 ENCOUNTER — Ambulatory Visit: Payer: Medicare Other | Admitting: Physical Therapy

## 2014-07-18 DIAGNOSIS — R5381 Other malaise: Secondary | ICD-10-CM | POA: Diagnosis not present

## 2014-07-18 DIAGNOSIS — IMO0002 Reserved for concepts with insufficient information to code with codable children: Secondary | ICD-10-CM | POA: Diagnosis not present

## 2014-07-18 DIAGNOSIS — M25569 Pain in unspecified knee: Secondary | ICD-10-CM | POA: Diagnosis not present

## 2014-07-18 DIAGNOSIS — Z8551 Personal history of malignant neoplasm of bladder: Secondary | ICD-10-CM | POA: Diagnosis not present

## 2014-07-18 DIAGNOSIS — Z96659 Presence of unspecified artificial knee joint: Secondary | ICD-10-CM | POA: Diagnosis not present

## 2014-07-18 DIAGNOSIS — IMO0001 Reserved for inherently not codable concepts without codable children: Secondary | ICD-10-CM | POA: Diagnosis not present

## 2014-07-22 DIAGNOSIS — I10 Essential (primary) hypertension: Secondary | ICD-10-CM | POA: Diagnosis not present

## 2014-07-22 DIAGNOSIS — I4891 Unspecified atrial fibrillation: Secondary | ICD-10-CM | POA: Diagnosis not present

## 2014-07-22 DIAGNOSIS — R7309 Other abnormal glucose: Secondary | ICD-10-CM | POA: Diagnosis not present

## 2014-07-23 ENCOUNTER — Encounter: Payer: Medicare Other | Admitting: *Deleted

## 2014-07-24 ENCOUNTER — Ambulatory Visit: Payer: Medicare Other | Admitting: Physical Therapy

## 2014-07-24 DIAGNOSIS — R5381 Other malaise: Secondary | ICD-10-CM | POA: Diagnosis not present

## 2014-07-24 DIAGNOSIS — Z96659 Presence of unspecified artificial knee joint: Secondary | ICD-10-CM | POA: Diagnosis not present

## 2014-07-24 DIAGNOSIS — IMO0002 Reserved for concepts with insufficient information to code with codable children: Secondary | ICD-10-CM | POA: Diagnosis not present

## 2014-07-24 DIAGNOSIS — Z8551 Personal history of malignant neoplasm of bladder: Secondary | ICD-10-CM | POA: Diagnosis not present

## 2014-07-24 DIAGNOSIS — IMO0001 Reserved for inherently not codable concepts without codable children: Secondary | ICD-10-CM | POA: Diagnosis not present

## 2014-07-24 DIAGNOSIS — M25569 Pain in unspecified knee: Secondary | ICD-10-CM | POA: Diagnosis not present

## 2014-07-25 ENCOUNTER — Encounter: Payer: Medicare Other | Admitting: *Deleted

## 2014-07-25 DIAGNOSIS — I4891 Unspecified atrial fibrillation: Secondary | ICD-10-CM | POA: Diagnosis not present

## 2014-07-25 DIAGNOSIS — I10 Essential (primary) hypertension: Secondary | ICD-10-CM | POA: Diagnosis not present

## 2014-07-25 DIAGNOSIS — F411 Generalized anxiety disorder: Secondary | ICD-10-CM | POA: Diagnosis not present

## 2014-07-25 DIAGNOSIS — R7309 Other abnormal glucose: Secondary | ICD-10-CM | POA: Diagnosis not present

## 2014-07-26 ENCOUNTER — Ambulatory Visit: Payer: Medicare Other | Admitting: *Deleted

## 2014-07-26 DIAGNOSIS — Z8551 Personal history of malignant neoplasm of bladder: Secondary | ICD-10-CM | POA: Diagnosis not present

## 2014-07-26 DIAGNOSIS — Z96659 Presence of unspecified artificial knee joint: Secondary | ICD-10-CM | POA: Diagnosis not present

## 2014-07-26 DIAGNOSIS — IMO0002 Reserved for concepts with insufficient information to code with codable children: Secondary | ICD-10-CM | POA: Diagnosis not present

## 2014-07-26 DIAGNOSIS — R5381 Other malaise: Secondary | ICD-10-CM | POA: Diagnosis not present

## 2014-07-26 DIAGNOSIS — IMO0001 Reserved for inherently not codable concepts without codable children: Secondary | ICD-10-CM | POA: Diagnosis not present

## 2014-07-26 DIAGNOSIS — M25569 Pain in unspecified knee: Secondary | ICD-10-CM | POA: Diagnosis not present

## 2014-07-30 ENCOUNTER — Encounter: Payer: Medicare Other | Admitting: *Deleted

## 2014-08-01 ENCOUNTER — Encounter: Payer: Medicare Other | Admitting: *Deleted

## 2014-08-01 DIAGNOSIS — Z7901 Long term (current) use of anticoagulants: Secondary | ICD-10-CM | POA: Diagnosis not present

## 2014-08-01 DIAGNOSIS — I4891 Unspecified atrial fibrillation: Secondary | ICD-10-CM | POA: Diagnosis not present

## 2014-08-05 ENCOUNTER — Ambulatory Visit: Payer: Medicare Other | Admitting: Physical Therapy

## 2014-08-05 DIAGNOSIS — IMO0002 Reserved for concepts with insufficient information to code with codable children: Secondary | ICD-10-CM | POA: Diagnosis not present

## 2014-08-05 DIAGNOSIS — Z8551 Personal history of malignant neoplasm of bladder: Secondary | ICD-10-CM | POA: Diagnosis not present

## 2014-08-05 DIAGNOSIS — IMO0001 Reserved for inherently not codable concepts without codable children: Secondary | ICD-10-CM | POA: Diagnosis not present

## 2014-08-05 DIAGNOSIS — M25569 Pain in unspecified knee: Secondary | ICD-10-CM | POA: Diagnosis not present

## 2014-08-05 DIAGNOSIS — Z96659 Presence of unspecified artificial knee joint: Secondary | ICD-10-CM | POA: Diagnosis not present

## 2014-08-05 DIAGNOSIS — R5381 Other malaise: Secondary | ICD-10-CM | POA: Diagnosis not present

## 2014-08-14 DIAGNOSIS — Z96659 Presence of unspecified artificial knee joint: Secondary | ICD-10-CM | POA: Diagnosis not present

## 2014-08-14 DIAGNOSIS — M25569 Pain in unspecified knee: Secondary | ICD-10-CM | POA: Diagnosis not present

## 2014-08-14 DIAGNOSIS — G5780 Other specified mononeuropathies of unspecified lower limb: Secondary | ICD-10-CM | POA: Diagnosis not present

## 2014-08-22 DIAGNOSIS — Z8551 Personal history of malignant neoplasm of bladder: Secondary | ICD-10-CM | POA: Diagnosis not present

## 2014-08-22 DIAGNOSIS — N281 Cyst of kidney, acquired: Secondary | ICD-10-CM | POA: Diagnosis not present

## 2014-08-26 DIAGNOSIS — N3941 Urge incontinence: Secondary | ICD-10-CM | POA: Diagnosis not present

## 2014-08-26 DIAGNOSIS — Z8551 Personal history of malignant neoplasm of bladder: Secondary | ICD-10-CM | POA: Diagnosis not present

## 2014-08-29 DIAGNOSIS — I4891 Unspecified atrial fibrillation: Secondary | ICD-10-CM | POA: Diagnosis not present

## 2014-08-29 DIAGNOSIS — Z7901 Long term (current) use of anticoagulants: Secondary | ICD-10-CM | POA: Diagnosis not present

## 2014-09-09 ENCOUNTER — Encounter: Payer: Self-pay | Admitting: Internal Medicine

## 2014-09-12 DIAGNOSIS — Z7901 Long term (current) use of anticoagulants: Secondary | ICD-10-CM | POA: Diagnosis not present

## 2014-09-12 DIAGNOSIS — I4891 Unspecified atrial fibrillation: Secondary | ICD-10-CM | POA: Diagnosis not present

## 2014-09-18 DIAGNOSIS — Z23 Encounter for immunization: Secondary | ICD-10-CM | POA: Diagnosis not present

## 2014-10-14 DIAGNOSIS — I4891 Unspecified atrial fibrillation: Secondary | ICD-10-CM | POA: Diagnosis not present

## 2014-10-14 DIAGNOSIS — Z7901 Long term (current) use of anticoagulants: Secondary | ICD-10-CM | POA: Diagnosis not present

## 2014-10-23 DIAGNOSIS — I10 Essential (primary) hypertension: Secondary | ICD-10-CM | POA: Diagnosis not present

## 2014-10-24 DIAGNOSIS — E785 Hyperlipidemia, unspecified: Secondary | ICD-10-CM | POA: Diagnosis not present

## 2014-10-29 DIAGNOSIS — R109 Unspecified abdominal pain: Secondary | ICD-10-CM | POA: Diagnosis not present

## 2014-10-29 DIAGNOSIS — R600 Localized edema: Secondary | ICD-10-CM | POA: Diagnosis not present

## 2014-10-29 DIAGNOSIS — I10 Essential (primary) hypertension: Secondary | ICD-10-CM | POA: Diagnosis not present

## 2014-10-29 DIAGNOSIS — E780 Pure hypercholesterolemia: Secondary | ICD-10-CM | POA: Diagnosis not present

## 2014-11-11 DIAGNOSIS — I4891 Unspecified atrial fibrillation: Secondary | ICD-10-CM | POA: Diagnosis not present

## 2014-11-11 DIAGNOSIS — Z7901 Long term (current) use of anticoagulants: Secondary | ICD-10-CM | POA: Diagnosis not present

## 2014-11-25 DIAGNOSIS — Z7901 Long term (current) use of anticoagulants: Secondary | ICD-10-CM | POA: Diagnosis not present

## 2014-11-25 DIAGNOSIS — I4891 Unspecified atrial fibrillation: Secondary | ICD-10-CM | POA: Diagnosis not present

## 2014-12-01 IMAGING — CT CT ABD-PELV W/ CM
2 of 5 series · 17 of 46 positions shown, 19 images · IV contrast (READICAT/WATER & [ID] OMNI 300)
Comparison: 04/20/2011

CLINICAL DATA: Abdominal mass.  Left lower quadrant lump.

CT ABDOMEN AND PELVIS WITH CONTRAST
TECHNIQUE: Multidetector CT imaging of the abdomen and pelvis was
performed following the standard protocol during bolus
administration of intravenous contrast.
Contrast: 100mL OMNIPAQUE IOHEXOL 300 MG/ML  SOLN. The patient was
given a 13 hours prep due to a history of contrast allergy.

[Series 2: abd/pelvis with · axial · 0.76mm/px · z∈[-384,+32]mm · 14 of 90 slices shown, 16 images]
[im 5/90  soft-tissue]
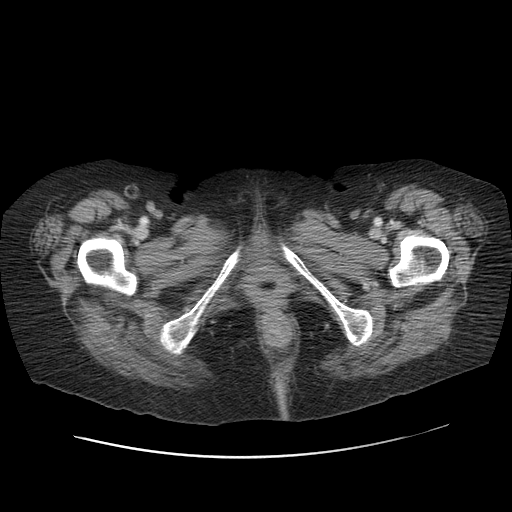
[im 5/90  bone]
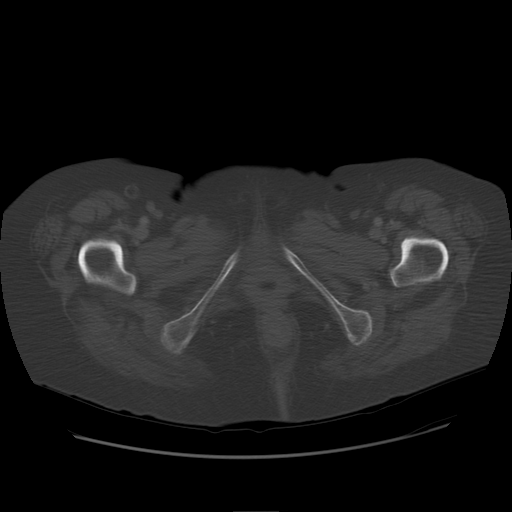
[im 10/90  soft-tissue]
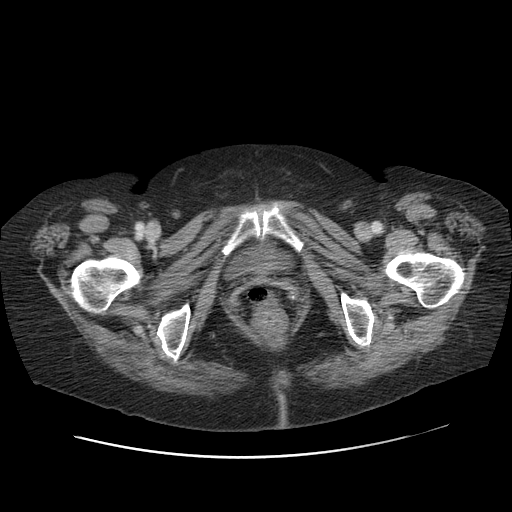
[im 19/90  soft-tissue]
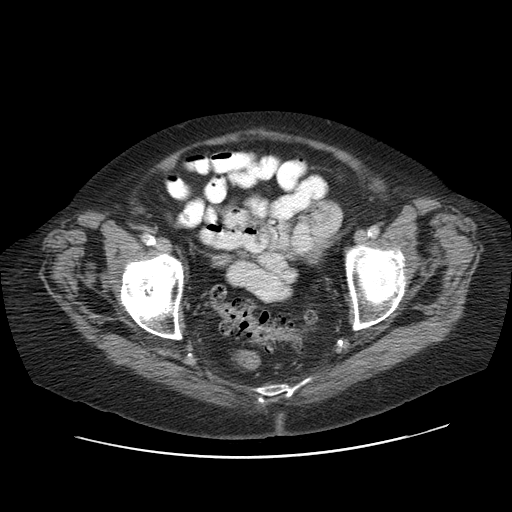
[im 24/90  soft-tissue]
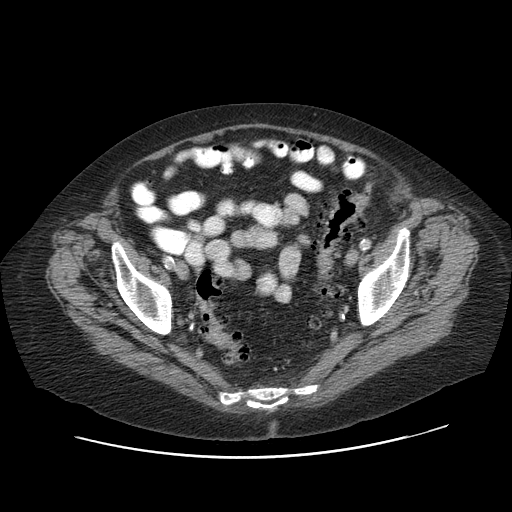
[im 29/90  soft-tissue]
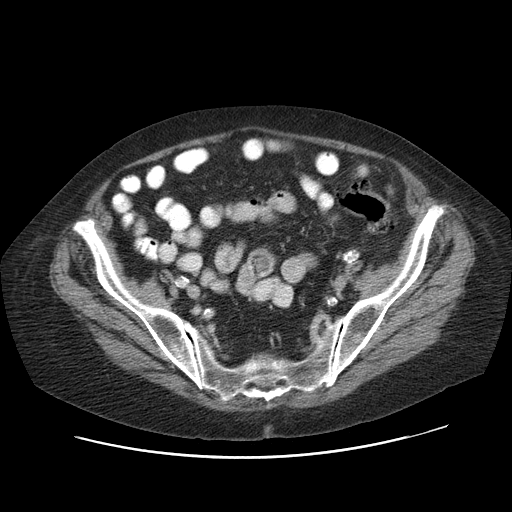
[im 38/90  soft-tissue]
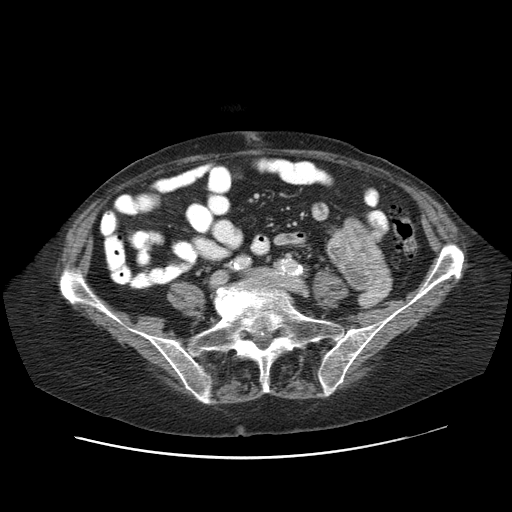
[im 43/90  soft-tissue]
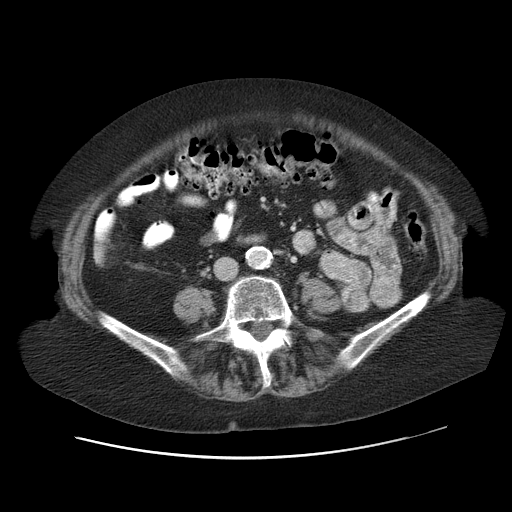
[im 47/90  soft-tissue]
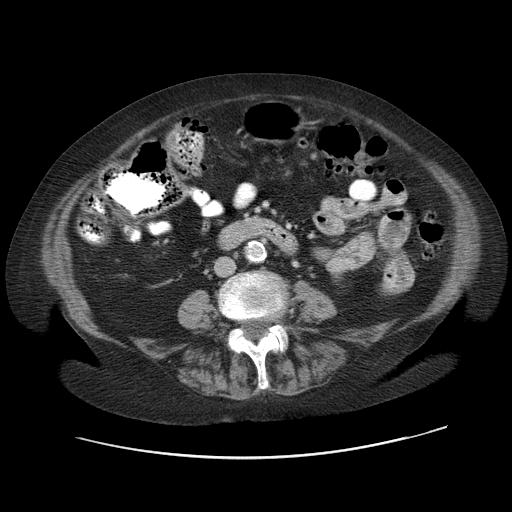
[im 52/90  soft-tissue]
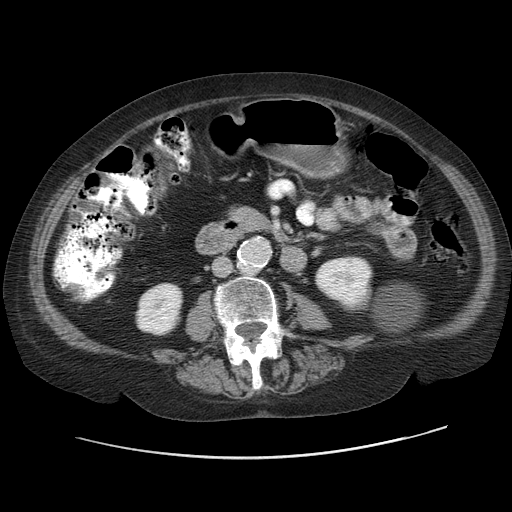
[im 52/90  bone]
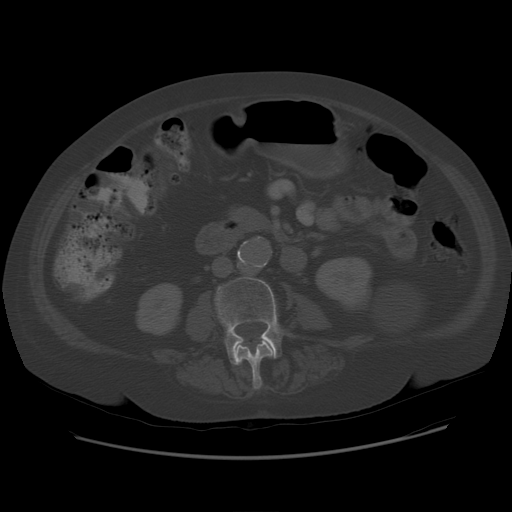
[im 61/90  soft-tissue]
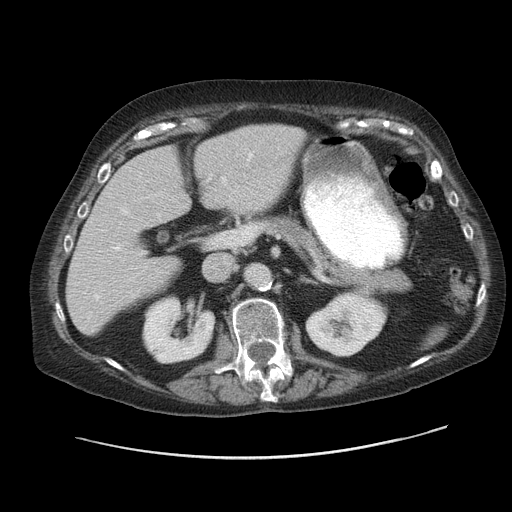
[im 66/90  soft-tissue]
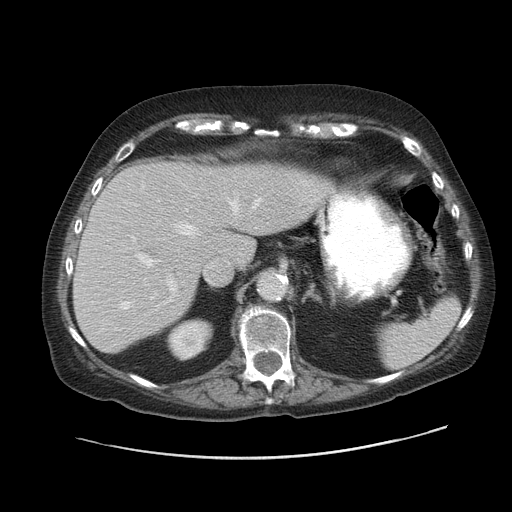
[im 71/90  soft-tissue]
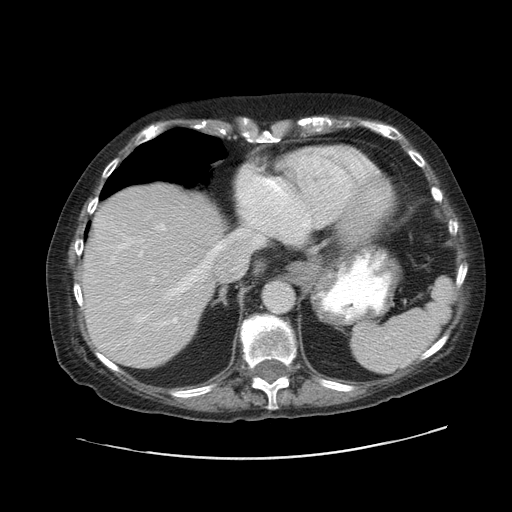
[im 80/90  soft-tissue]
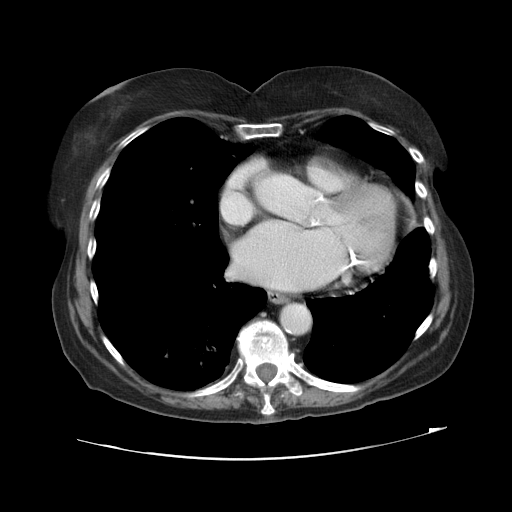
[im 85/90  soft-tissue]
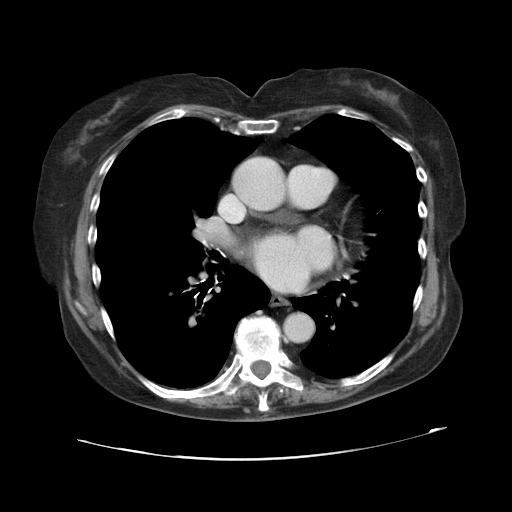

[Series 400: cor · coronal · 0.95mm/px · 3 of 135 slices shown]
[im 45/135  soft-tissue]
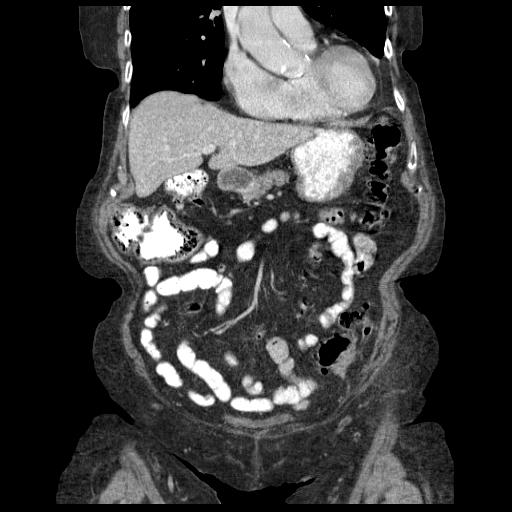
[im 60/135  soft-tissue]
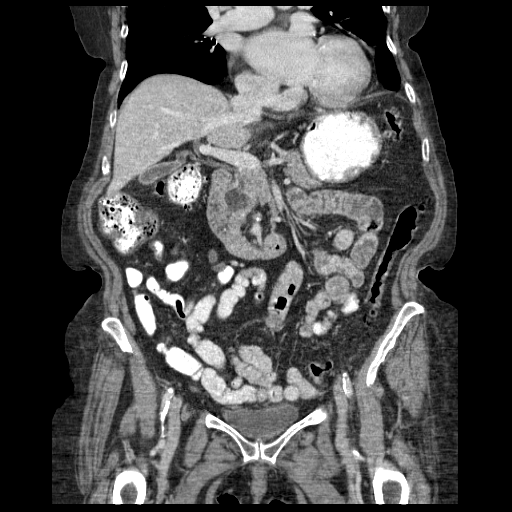
[im 75/135  soft-tissue]
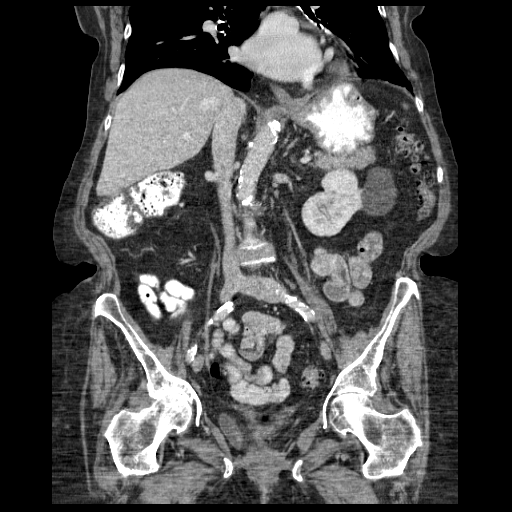

[17 of 46 positions shown; findings below may reference images not displayed]

FINDINGS: Chronic densities in the lung bases bilaterally, likely
scarring.  Mild cardiomegaly.  No pleural effusions.

Liver, gallbladder, spleen, pancreas and adrenals have an
unremarkable unenhanced appearance.  Large benign-appearing cyst in
the lower pole of the left kidney measuring 6.8 cm, stable.  Right
kidney unremarkable.

Diffuse colonic diverticulosis.  Slight inflammatory changes around
the distal descending colon.  There is a soft tissue tract which
contains locules of gas and which extends from the distal
descending colon to adjacent left lower quadrant small bowel loops.
Cannot exclude colo-enteric fistula from prior inflammatory
process.   Slight inflammatory changes around this apparent tract.

Urinary bladder is decompressed.  Prior hysterectomy.  No adnexal
masses.  Aorta and iliac vessels are heavily calcified.  Slight
ectasia of the distal abdominal aorta measuring 2.4 cm maximally,
stable.

Appendix is visualized and is normal.
IMPRESSION: Extensive diverticular disease throughout the colon.

There is apparent soft tissue tract containing locules of gas
extending from the distal transverse colon (beginning on image 61)
to left lower quadrant small bowel loop of (image 68).  Cannot
exclude colo-enteric fistula.  There is slight surrounding
inflammatory change around this apparent tract.

Cardiomegaly.

## 2014-12-17 ENCOUNTER — Ambulatory Visit (INDEPENDENT_AMBULATORY_CARE_PROVIDER_SITE_OTHER): Payer: PPO | Admitting: Internal Medicine

## 2014-12-17 ENCOUNTER — Encounter: Payer: Self-pay | Admitting: Internal Medicine

## 2014-12-17 VITALS — BP 120/68 | HR 64 | Ht 65.0 in | Wt 164.6 lb

## 2014-12-17 DIAGNOSIS — K5901 Slow transit constipation: Secondary | ICD-10-CM

## 2014-12-17 DIAGNOSIS — K439 Ventral hernia without obstruction or gangrene: Secondary | ICD-10-CM

## 2014-12-17 NOTE — Progress Notes (Signed)
[name] Jul 04, 1921 [id number]  Note: This dictation was prepared with Dragon digital system. Any transcriptional errors that result from this procedure are unintentional.   History of Present Illness:  This is a 81 year old white female who has been complaining 10f bloating and abdominal distention. She is also due for colonoscopy but she is not a candidate for recall due to her age. Her last exam in December 2008 showed severe diverticulosis and hyperplastic polyps. She comes with her daughter who reports her mother having abdominal epigastric pain after she eats excessive amounts of food.. She has  regular bowel habits while taking Metamucil 1 heaping teaspoon twice a day. She has an infrarenal aortic aneurysm which measured 3.2 cm on CT scan in November 2014 and 3.4 cm on a recent CT scan in [facility]. She has gastroesophageal reflux which is controlled on Prilosec 20 mg twice a day.    Past Medical History  Diagnosis Date  . Coronary artery disease     Nonobstructive  . GERD (gastroesophageal reflux disease)   . Headache(784.0)   . High cholesterol   . DVT (deep venous thrombosis), right 2005    "dry blood; after 8 foot fall"  . Chronic bronchitis     "get it basically 3 times/yr"  . Blood transfusion 1960  . H/O hiatal hernia   . Rheumatoid arthritis(714.0)   . Concussion w/o coma 9/73/5329    Complicated by subarachnoid hemorrhage.  "even now has times when she's not able to comprehend" (05/08/12)  . Chronic atrial fibrillation     Anticoagulated with warfarin, rate control with diltiazem and Toprol  . Pneumonia ~ 2010 AND 2013  . 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 Diverticulitis   . Diverticulosis   . External hemorrhoids   . Fatty liver   . AAA (abdominal aortic aneurysm)   . Edema of both legs     Chronic, thought to be secondary to DVTs  . Hx of bipolar disorder     Past Surgical History   Procedure Laterality Date  . Transurethral resection of bladder tumor  11/11/2011    Procedure: TRANSURETHRAL RESECTION OF BLADDER TUMOR (TURBT);  Surgeon: Malka So;  Location: [facility];  Service: Urology;  Laterality: N/A;  Cysto, Bladder Biopsy, TURBT with Gyrus,   . Cystoscopy  11/11/2011    Procedure: CYSTOSCOPY;  Surgeon: Malka So;  Location: [facility];  Service: Urology;  Laterality: N/A;  . Bladder lift  YRS AGO  . Bladder cancer  2010; 2009    "for tumors on surface of bladder"  . Knee arthroscopy  2005    right; S/P fall  . Cataract extraction w/ intraocular lens  implant, bilateral Bilateral 1992  . Back surgery  1976 UPPER BACK    tumor removed-benign  . Tonsillectomy  1952  . Breast cyst excision  YRS AGO    2 cysts removed-left breast-benign  . Cardiac catheterization  1980's  . Abdominal hysterectomy  1960    partial hysterectomy  . Left thumb  benign tumor removed  1980'S  . Total knee arthroplasty Right 02/05/2013    Procedure: TOTAL KNEE ARTHROPLASTY;  Surgeon: Mauri Pole, MD;  Location: [facility];  Service: Orthopedics;  Laterality: Right;  . Cystostomy w/ bladder biopsy  10/08/2013  . Lower extremity venous doppler  11/10/2013    No DVT or superficial thrombus enlarged inguinal lymph node noted in the right. No Baker's cyst.  Allergies  Allergen Reactions  . Contrast Media [Iodinated Diagnostic Agents] Hives  . Levaquin [Levofloxacin] Itching and Rash  . Penicillins Rash    "haven't had it in years"  . Vancomycin Itching and Rash    Family history and social history have been reviewed.  Review of Systems: Abdominal distention. Epigastric discomfort. Negative for dysphagia or nausea  The remainder of the 10 point ROS is negative except as outlined in the H&P  Physical Exam: General Appearance Well developed, in no distress Eyes  Non icteric  HEENT  Non traumatic, normocephalic  Mouth No lesion, tongue papillated, no cheilosis Neck Supple without  adenopathy, thyroid not enlarged, no carotid bruits, no JVD Lungs Clear to auscultation bilaterally COR Normal S1, normal S2, regular rhythm, no murmur, quiet precordium Abdomen soft minimally tender in epigastrium. Diathesis recti. Normoactive bowel sounds. Liver edge at costal margin. Lower abdomen unremarkable Rectal not done, post total knee replacement on the right Extremities  2+ pedal edema Skin No lesions, few ecchymoses Neurological Alert and oriented x 3 Psychological Normal mood and affect  Assessment and Plan:   Problem #16 81 year old white female who is not a candidate for recall colonoscopy due to her age. She will use Metamucil 1 heaping teaspoon twice a day and a high-fiber diet for bowel regularity.  Problem #2 Diathesis recti causing abdominal distention. I assured patient of the benign nature of this abnormality. I recommended a girdle to support her abdominal muscles.  Problem #3 Gastroesophageal reflux. She will continue Prilosec 20 mg twice a day. She will also purchase Gaviscon and take 1-2 tablets after meals for indigestion.  Problem #4 Infrarenal aortic aneurysm  followed by Dr. [name].     Delfin Edis 12/17/2014

## 2014-12-17 NOTE — Patient Instructions (Signed)
Please purchase the following medications over the counter and take as directed: Gaviscon-1 to 2 tablets by mouth after meals for indigestion  CC:Dr [name]

## 2014-12-23 DIAGNOSIS — I4891 Unspecified atrial fibrillation: Secondary | ICD-10-CM | POA: Diagnosis not present

## 2014-12-23 DIAGNOSIS — Z7901 Long term (current) use of anticoagulants: Secondary | ICD-10-CM | POA: Diagnosis not present

## 2015-05-06 ENCOUNTER — Other Ambulatory Visit: Payer: Self-pay | Admitting: Internal Medicine

## 2015-05-06 DIAGNOSIS — M7989 Other specified soft tissue disorders: Secondary | ICD-10-CM

## 2015-05-06 DIAGNOSIS — M79604 Pain in right leg: Secondary | ICD-10-CM

## 2015-05-07 ENCOUNTER — Other Ambulatory Visit: Payer: Self-pay | Admitting: Internal Medicine

## 2015-05-07 ENCOUNTER — Ambulatory Visit
Admission: RE | Admit: 2015-05-07 | Discharge: 2015-05-07 | Disposition: A | Discharge status: Home / Self Care | Payer: PPO | Source: Ambulatory Visit | Attending: Internal Medicine | Admitting: Internal Medicine

## 2015-05-07 DIAGNOSIS — M79604 Pain in right leg: Secondary | ICD-10-CM

## 2015-05-07 DIAGNOSIS — I719 Aortic aneurysm of unspecified site, without rupture: Secondary | ICD-10-CM

## 2015-05-07 DIAGNOSIS — M7989 Other specified soft tissue disorders: Secondary | ICD-10-CM

## 2015-05-20 ENCOUNTER — Ambulatory Visit
Admission: RE | Admit: 2015-05-20 | Discharge: 2015-05-20 | Disposition: A | Discharge status: Home / Self Care | Payer: PPO | Source: Ambulatory Visit | Attending: Internal Medicine | Admitting: Internal Medicine

## 2015-05-20 DIAGNOSIS — I719 Aortic aneurysm of unspecified site, without rupture: Secondary | ICD-10-CM

## 2015-05-23 ENCOUNTER — Other Ambulatory Visit: Payer: PPO

## 2015-06-05 ENCOUNTER — Ambulatory Visit (HOSPITAL_COMMUNITY)
Admission: RE | Admit: 2015-06-05 | Discharge: 2015-06-05 | Disposition: A | Discharge status: Home / Self Care | Payer: PPO | Source: Ambulatory Visit | Attending: Orthopedic Surgery | Admitting: Orthopedic Surgery

## 2015-06-05 DIAGNOSIS — W5501XA Bitten by cat, initial encounter: Secondary | ICD-10-CM | POA: Insufficient documentation

## 2015-06-05 DIAGNOSIS — T148 Other injury of unspecified body region: Secondary | ICD-10-CM | POA: Diagnosis present

## 2015-06-05 MED ORDER — SODIUM CHLORIDE 0.9 % IV SOLN
1.5000 g | Freq: Once | INTRAVENOUS | Status: DC
  Filled 2015-06-05: qty 1.5

## 2015-06-05 MED ORDER — CEFTRIAXONE SODIUM IN DEXTROSE 20 MG/ML IV SOLN
1.0000 g | INTRAVENOUS | Status: DC
  Administered 2015-06-05: 1 g via INTRAVENOUS
  Filled 2015-06-05: qty 50

## 2015-06-05 MED ORDER — METRONIDAZOLE 500 MG PO TABS
500.0000 mg | ORAL_TABLET | Freq: Three times a day (TID) | ORAL | Status: DC
  Administered 2015-06-05: 500 mg via ORAL
  Filled 2015-06-05: qty 1

## 2015-06-06 ENCOUNTER — Ambulatory Visit (HOSPITAL_COMMUNITY)
Admission: RE | Admit: 2015-06-06 | Discharge: 2015-06-06 | Disposition: A | Discharge status: Home / Self Care | Payer: PPO | Source: Ambulatory Visit | Attending: Orthopedic Surgery | Admitting: Orthopedic Surgery

## 2015-06-06 DIAGNOSIS — T148 Other injury of unspecified body region: Secondary | ICD-10-CM | POA: Insufficient documentation

## 2015-06-06 DIAGNOSIS — W5501XA Bitten by cat, initial encounter: Secondary | ICD-10-CM | POA: Diagnosis not present

## 2015-06-06 MED ORDER — METRONIDAZOLE 500 MG PO TABS
500.0000 mg | ORAL_TABLET | Freq: Three times a day (TID) | ORAL | Status: DC
  Administered 2015-06-06: 500 mg via ORAL
  Filled 2015-06-06: qty 1

## 2015-06-06 MED ORDER — CEFTRIAXONE SODIUM IN DEXTROSE 20 MG/ML IV SOLN
1.0000 g | INTRAVENOUS | Status: DC
  Administered 2015-06-06: 1 g via INTRAVENOUS
  Filled 2015-06-06: qty 50

## 2015-06-09 ENCOUNTER — Encounter (HOSPITAL_COMMUNITY)
Admission: RE | Admit: 2015-06-09 | Discharge: 2015-06-09 | Disposition: A | Discharge status: Home / Self Care | Payer: PPO | Source: Ambulatory Visit | Attending: Orthopedic Surgery | Admitting: Orthopedic Surgery

## 2015-06-09 DIAGNOSIS — W5501XA Bitten by cat, initial encounter: Secondary | ICD-10-CM | POA: Insufficient documentation

## 2015-06-09 DIAGNOSIS — T148 Other injury of unspecified body region: Secondary | ICD-10-CM | POA: Diagnosis present

## 2015-06-09 MED ORDER — METRONIDAZOLE 500 MG PO TABS
500.0000 mg | ORAL_TABLET | Freq: Once | ORAL | Status: DC
  Filled 2015-06-09: qty 1

## 2015-06-09 MED ORDER — CEFTRIAXONE SODIUM 1 G IJ SOLR
1.0000 g | Freq: Once | INTRAMUSCULAR | Status: AC
  Administered 2015-06-09: 1 g via INTRAVENOUS
  Filled 2015-06-09: qty 10

## 2015-06-09 MED ORDER — CEFTRIAXONE SODIUM IN DEXTROSE 20 MG/ML IV SOLN: 1.0000 g | INTRAVENOUS | Status: DC

## 2015-06-10 ENCOUNTER — Other Ambulatory Visit (HOSPITAL_COMMUNITY): Payer: Self-pay | Admitting: *Deleted

## 2015-06-10 ENCOUNTER — Encounter (HOSPITAL_COMMUNITY)
Admission: RE | Admit: 2015-06-10 | Discharge: 2015-06-10 | Disposition: A | Discharge status: Home / Self Care | Payer: PPO | Source: Ambulatory Visit | Attending: Orthopedic Surgery | Admitting: Orthopedic Surgery

## 2015-06-10 DIAGNOSIS — W5501XA Bitten by cat, initial encounter: Secondary | ICD-10-CM | POA: Insufficient documentation

## 2015-06-10 DIAGNOSIS — T148 Other injury of unspecified body region: Secondary | ICD-10-CM | POA: Diagnosis present

## 2015-06-10 MED ORDER — CEFTRIAXONE SODIUM IN DEXTROSE 20 MG/ML IV SOLN
1.0000 g | INTRAVENOUS | Status: DC
  Administered 2015-06-10: 1 g via INTRAVENOUS
  Filled 2015-06-10: qty 50

## 2015-06-10 MED ORDER — SODIUM CHLORIDE 0.9 % IV SOLN
Freq: Once | INTRAVENOUS | Status: AC
  Administered 2015-06-10: 12:00:00 via INTRAVENOUS

## 2015-06-10 NOTE — Progress Notes (Signed)
Called and spoke with Dr. Angus Palms office about family's inquiry about patient taking Flagyl PO while receiving Rocephin IV.  Per Dr. Caralyn Guile, patient does not need Flagyl PO at the moment.  Patient will receive IV Rocephin in Short Stay and follow up with MD in office.

## 2015-06-11 ENCOUNTER — Encounter (HOSPITAL_COMMUNITY)
Admission: RE | Admit: 2015-06-11 | Discharge: 2015-06-11 | Disposition: A | Discharge status: Home / Self Care | Payer: PPO | Source: Ambulatory Visit | Attending: Orthopedic Surgery | Admitting: Orthopedic Surgery

## 2015-06-11 DIAGNOSIS — T148 Other injury of unspecified body region: Secondary | ICD-10-CM | POA: Diagnosis not present

## 2015-06-11 MED ORDER — CEFTRIAXONE SODIUM IN DEXTROSE 20 MG/ML IV SOLN
1.0000 g | INTRAVENOUS | Status: AC
  Administered 2015-06-11: 1 g via INTRAVENOUS
  Filled 2015-06-11: qty 50

## 2015-06-20 ENCOUNTER — Encounter: Payer: Self-pay | Admitting: Vascular Surgery

## 2015-06-24 ENCOUNTER — Encounter: Payer: Self-pay | Admitting: Vascular Surgery

## 2015-06-24 ENCOUNTER — Ambulatory Visit (INDEPENDENT_AMBULATORY_CARE_PROVIDER_SITE_OTHER): Payer: PPO | Admitting: Vascular Surgery

## 2015-06-24 VITALS — BP 137/77 | HR 72 | Resp 16 | Ht 65.0 in | Wt 162.0 lb

## 2015-06-24 DIAGNOSIS — I714 Abdominal aortic aneurysm, without rupture, unspecified: Secondary | ICD-10-CM

## 2015-06-24 NOTE — Progress Notes (Signed)
Subjective:     Patient ID: [name], female   DOB: Oct 19, 1921, 81 y.o.   MRN: [id number]  HPI this 81 year old female was referred by Dr. [name] for evaluation of abdominal aortic aneurysm and possible bilateral iliac aneurysms. Patient was noted to have a 3.2 cm abdominal aortic aneurysm in January 2015. She recently had an ultrasound which revealed the aneurysm to be 3.9 cm with both iliac arteries measuring 16 and 17 mm respectively. She has no history of severe abdominal or back discomfort. She does have a known ventral hernia which was evaluated by Dr. Elicia Lamp in the past. She does have chronic atrial fibrillation and is on chronic Coumadin therapy. She is quite active.  Past Medical History  Diagnosis Date  . Coronary artery disease     Nonobstructive  . GERD (gastroesophageal reflux disease)   . Headache(784.0)   . High cholesterol   . DVT (deep venous thrombosis), right 2005    "dry blood; after 8 foot fall"  . Chronic bronchitis     "get it basically 3 times/yr"  . Blood transfusion 1960  . H/O hiatal hernia   . Rheumatoid arthritis(714.0)   . Concussion w/o coma 12/18/2692    Complicated by subarachnoid hemorrhage.  "even now has times when she's not able to comprehend" (05/08/12)  . Chronic atrial fibrillation     Anticoagulated with warfarin, rate control with diltiazem and Toprol  . Pneumonia ~ 2010 AND 2013  . 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 Diverticulitis   . Diverticulosis   . External hemorrhoids   . Fatty liver   . AAA (abdominal aortic aneurysm)   . Edema of both legs     Chronic, thought to be secondary to DVTs  . Hx of bipolar disorder     History  Substance Use Topics  . Smoking status: Former Smoker -- 1.00 packs/day for 50 years    Types: Cigarettes    Quit date: 11/07/1982  . Smokeless tobacco: Never Used  . Alcohol Use: 4.2 oz/week    7 Glasses of wine per week    Family  History  Problem Relation Age of Onset  . Anesthesia problems Daughter   . Prostate cancer Brother   . Breast cancer Other     neice  . Heart disease Brother   . Colon cancer Neg Hx     Allergies  Allergen Reactions  . Contrast Media [Iodinated Diagnostic Agents] Hives  . Levaquin [Levofloxacin] Itching and Rash  . Penicillins Rash    "haven't had it in years"  . Vancomycin Itching and Rash     Current outpatient prescriptions:  .  acetaminophen (TYLENOL) 500 MG tablet, Take 1,000 mg by mouth every 6 (six) hours as needed for pain., Disp: , Rfl:  .  calcium carbonate (OS-CAL - DOSED IN MG OF ELEMENTAL CALCIUM) 1250 MG tablet, Take 1 tablet by mouth every morning. , Disp: , Rfl:  .  cholecalciferol (VITAMIN D) 1000 UNITS tablet, Take 1,000 Units by mouth daily., Disp: , Rfl:  .  Cinnamon 500 MG capsule, Take 1,000 mg by mouth daily., Disp: , Rfl:  .  diltiazem (CARDIZEM CD) 240 MG 24 hr capsule, Take 240 mg by mouth daily., Disp: , Rfl:  .  furosemide (LASIX) 40 MG tablet, Take 40-80 mg by mouth daily. 2 (80mg ) tablets on mon,wed and Friday. All other days 1 tablet (40mg ), Disp: , Rfl:  .  metoprolol (TOPROL-XL) 50 MG 24 hr tablet, Take 50 mg by mouth at bedtime. , Disp: , Rfl:  .  Multiple Vitamins-Minerals (MULTIVITAMINS THER. W/MINERALS) TABS, Take 1 tablet by mouth daily. , Disp: , Rfl:  .  Omega-3 Fatty Acids (FISH OIL) 1000 MG CAPS, Take 1,000 mg by mouth daily., Disp: , Rfl:  .  omeprazole (PRILOSEC) 20 MG capsule, Take 20 mg by mouth 2 (two) times daily., Disp: , Rfl:  .  psyllium (METAMUCIL) 58.6 % powder, Take 1 packet by mouth 2 (two) times daily. , Disp: , Rfl:  .  rosuvastatin (CRESTOR) 5 MG tablet, Take 5 mg by mouth every evening. , Disp: , Rfl:  .  sertraline (ZOLOFT) 50 MG tablet, , Disp: , Rfl:  .  spironolactone (ALDACTONE) 25 MG tablet, Take 25 mg by mouth every morning., Disp: , Rfl:  .  warfarin (COUMADIN) 5 MG tablet, Take 5-7.5 mg by mouth daily. 1  tablet(5mg ) on mon,wed,friday. All other days take 1.5 tablets (7.5mg ), Disp: , Rfl:   Filed Vitals:   06/24/15 0955  BP: 137/77  Pulse: 72  Resp: 16  Height: 5\' 5"  (1.651 m)  Weight: 162 lb (73.483 kg)    Body mass index is 26.96 kg/(m^2).           Review of Systems has history of chronic A. fib. Nonobstructive coronary artery disease, gastroesophageal reflux disease, ventral hernia, denies chest pain, denies claudication. Has no episodes of lateralizing weakness, aphasia, amaurosis fugax, diplopia, or syncope. Other systems negative and a complete review of systems     Objective:   Physical Exam BP 137/77 mmHg  Pulse 72  Resp 16  Ht 5\' 5"  (1.651 m)  Wt 162 lb (73.483 kg)  BMI 26.96 kg/m2  Gen.-alert and oriented x3 in no apparent distress HEENT normal for age Lungs no rhonchi or wheezing Cardiovascular irregular rhythm no murmurs carotid pulses 3+ palpable no bruits audible Abdomen soft nontender no palpable masses Musculoskeletal free of  major deformities Skin clear -no rashes Neurologic normal Lower extremities 3+ femoral and dorsalis pedis pulses palpable bilaterally with 1+ edema bilaterally. Extensive reticular vein network surrounding both ankles. No active ulceration.  Today I reviewed reviewed the ultrasound report from [facility] which was performed on 05/20/2015. This reveals 3.9 cm fusiform abdominal aortic aneurysm with slight dilatation of bilateral iliac arteries.       Assessment:     Small fusiform abdominal aortic aneurysm with maximum diameter 3.9 cm with bilateral iliac arteries slightly dilated but not truly aneurysmal-16-17 mm in diameter Chronic atrial fib on chronic Coumadin GERD Ventral hernia    Plan:     This 81 year old female likely never required treatment of this small fusiform abdominal aortic aneurysm. I discussed this at length with her and her daughter. We'll recommend for Dr. Maudie Mercury  to repeat ultrasound on every  one to two-year interval. If aneurysm approaches 5 cm in diameter please refer back and we will reevaluate but at patient's age it is very unlikely she will ever require treatment. We will be happy to follow this in our office if necessary but out of convenience to the patient and her age I suggested that this be done by Dr. Maudie Mercury

## 2015-08-14 ENCOUNTER — Inpatient Hospital Stay (HOSPITAL_COMMUNITY): Payer: PPO

## 2015-08-14 ENCOUNTER — Inpatient Hospital Stay (HOSPITAL_COMMUNITY)
Admission: EM | Admit: 2015-08-14 | Discharge: 2015-08-18 | DRG: 871 | Disposition: A | Discharge status: Home Health | Payer: PPO | Attending: Internal Medicine | Admitting: Internal Medicine

## 2015-08-14 ENCOUNTER — Emergency Department (HOSPITAL_COMMUNITY): Payer: PPO

## 2015-08-14 ENCOUNTER — Encounter (HOSPITAL_COMMUNITY): Payer: Self-pay | Admitting: Emergency Medicine

## 2015-08-14 DIAGNOSIS — L0390 Cellulitis, unspecified: Secondary | ICD-10-CM

## 2015-08-14 DIAGNOSIS — R509 Fever, unspecified: Secondary | ICD-10-CM | POA: Diagnosis not present

## 2015-08-14 DIAGNOSIS — Z91041 Radiographic dye allergy status: Secondary | ICD-10-CM

## 2015-08-14 DIAGNOSIS — E872 Acidosis: Secondary | ICD-10-CM | POA: Diagnosis present

## 2015-08-14 DIAGNOSIS — I739 Peripheral vascular disease, unspecified: Secondary | ICD-10-CM | POA: Diagnosis present

## 2015-08-14 DIAGNOSIS — Z7901 Long term (current) use of anticoagulants: Secondary | ICD-10-CM | POA: Diagnosis not present

## 2015-08-14 DIAGNOSIS — Z86718 Personal history of other venous thrombosis and embolism: Secondary | ICD-10-CM

## 2015-08-14 DIAGNOSIS — I482 Chronic atrial fibrillation, unspecified: Secondary | ICD-10-CM

## 2015-08-14 DIAGNOSIS — I82409 Acute embolism and thrombosis of unspecified deep veins of unspecified lower extremity: Secondary | ICD-10-CM | POA: Diagnosis not present

## 2015-08-14 DIAGNOSIS — N179 Acute kidney failure, unspecified: Secondary | ICD-10-CM | POA: Diagnosis present

## 2015-08-14 DIAGNOSIS — I714 Abdominal aortic aneurysm, without rupture, unspecified: Secondary | ICD-10-CM | POA: Diagnosis present

## 2015-08-14 DIAGNOSIS — Z79899 Other long term (current) drug therapy: Secondary | ICD-10-CM

## 2015-08-14 DIAGNOSIS — Z8551 Personal history of malignant neoplasm of bladder: Secondary | ICD-10-CM | POA: Diagnosis not present

## 2015-08-14 DIAGNOSIS — J189 Pneumonia, unspecified organism: Secondary | ICD-10-CM | POA: Diagnosis present

## 2015-08-14 DIAGNOSIS — R6521 Severe sepsis with septic shock: Secondary | ICD-10-CM | POA: Diagnosis present

## 2015-08-14 DIAGNOSIS — K219 Gastro-esophageal reflux disease without esophagitis: Secondary | ICD-10-CM | POA: Diagnosis present

## 2015-08-14 DIAGNOSIS — Z880 Allergy status to penicillin: Secondary | ICD-10-CM

## 2015-08-14 DIAGNOSIS — E876 Hypokalemia: Secondary | ICD-10-CM | POA: Diagnosis present

## 2015-08-14 DIAGNOSIS — Z87891 Personal history of nicotine dependence: Secondary | ICD-10-CM

## 2015-08-14 DIAGNOSIS — E860 Dehydration: Secondary | ICD-10-CM | POA: Diagnosis present

## 2015-08-14 DIAGNOSIS — I719 Aortic aneurysm of unspecified site, without rupture: Secondary | ICD-10-CM

## 2015-08-14 DIAGNOSIS — I2510 Atherosclerotic heart disease of native coronary artery without angina pectoris: Secondary | ICD-10-CM | POA: Diagnosis present

## 2015-08-14 DIAGNOSIS — Z888 Allergy status to other drugs, medicaments and biological substances status: Secondary | ICD-10-CM

## 2015-08-14 DIAGNOSIS — Z881 Allergy status to other antibiotic agents status: Secondary | ICD-10-CM | POA: Diagnosis not present

## 2015-08-14 DIAGNOSIS — M069 Rheumatoid arthritis, unspecified: Secondary | ICD-10-CM | POA: Diagnosis present

## 2015-08-14 DIAGNOSIS — I509 Heart failure, unspecified: Secondary | ICD-10-CM

## 2015-08-14 DIAGNOSIS — I5189 Other ill-defined heart diseases: Secondary | ICD-10-CM

## 2015-08-14 DIAGNOSIS — I519 Heart disease, unspecified: Secondary | ICD-10-CM

## 2015-08-14 DIAGNOSIS — L03116 Cellulitis of left lower limb: Secondary | ICD-10-CM | POA: Diagnosis present

## 2015-08-14 DIAGNOSIS — A419 Sepsis, unspecified organism: Principal | ICD-10-CM | POA: Diagnosis present

## 2015-08-14 DIAGNOSIS — I4821 Permanent atrial fibrillation: Secondary | ICD-10-CM | POA: Diagnosis present

## 2015-08-14 DIAGNOSIS — I4891 Unspecified atrial fibrillation: Secondary | ICD-10-CM | POA: Diagnosis present

## 2015-08-14 LAB — CBC WITH DIFFERENTIAL/PLATELET
BASOS ABS: 0 10*3/uL (ref 0.0–0.1)
BASOS ABS: 0 10*3/uL (ref 0.0–0.1)
BASOS PCT: 0 % (ref 0–1)
Basophils Relative: 0 % (ref 0–1)
EOS ABS: 0.1 10*3/uL (ref 0.0–0.7)
EOS ABS: 0.6 10*3/uL (ref 0.0–0.7)
Eosinophils Relative: 4 % (ref 0–5)
Eosinophils Relative: 0 % (ref 0–5)
HCT: 40.7 % (ref 36.0–46.0)
HCT: 32.8 % — ABNORMAL LOW (ref 36.0–46.0)
HEMOGLOBIN: 10.9 g/dL — AB (ref 12.0–15.0)
Hemoglobin: 13.5 g/dL (ref 12.0–15.0)
LYMPHS PCT: 15 % (ref 12–46)
Lymphocytes Relative: 8 % — ABNORMAL LOW (ref 12–46)
Lymphs Abs: 2.2 10*3/uL (ref 0.7–4.0)
Lymphs Abs: 1.1 10*3/uL (ref 0.7–4.0)
MCH: 33.3 pg (ref 26.0–34.0)
MCH: 33.2 pg (ref 26.0–34.0)
MCHC: 33.2 g/dL (ref 30.0–36.0)
MCHC: 33.2 g/dL (ref 30.0–36.0)
MCV: 100.2 fL — ABNORMAL HIGH (ref 78.0–100.0)
MCV: 100 fL (ref 78.0–100.0)
MONOS PCT: 8 % (ref 3–12)
Monocytes Absolute: 0.8 10*3/uL (ref 0.1–1.0)
Monocytes Absolute: 1 10*3/uL (ref 0.1–1.0)
Monocytes Relative: 6 % (ref 3–12)
NEUTROS PCT: 84 % — AB (ref 43–77)
Neutro Abs: 10.8 10*3/uL — ABNORMAL HIGH (ref 1.7–7.7)
Neutro Abs: 11 10*3/uL — ABNORMAL HIGH (ref 1.7–7.7)
Neutrophils Relative %: 75 % (ref 43–77)
Platelets: 208 10*3/uL (ref 150–400)
Platelets: 160 10*3/uL (ref 150–400)
RBC: 3.28 MIL/uL — ABNORMAL LOW (ref 3.87–5.11)
RBC: 4.06 MIL/uL (ref 3.87–5.11)
RDW: 13.1 % (ref 11.5–15.5)
RDW: 13.1 % (ref 11.5–15.5)
WBC: 13.2 10*3/uL — AB (ref 4.0–10.5)
WBC: 14.4 10*3/uL — AB (ref 4.0–10.5)

## 2015-08-14 LAB — PROTIME-INR
INR: 2.66 — ABNORMAL HIGH (ref 0.00–1.49)
PROTHROMBIN TIME: 28 s — AB (ref 11.6–15.2)

## 2015-08-14 LAB — CBG MONITORING, ED: Glucose-Capillary: 156 mg/dL — ABNORMAL HIGH (ref 65–99)

## 2015-08-14 LAB — COMPREHENSIVE METABOLIC PANEL
ALK PHOS: 54 U/L (ref 38–126)
ALT: 26 U/L (ref 14–54)
ALT: 22 U/L (ref 14–54)
AST: 35 U/L (ref 15–41)
AST: 27 U/L (ref 15–41)
Albumin: 4.1 g/dL (ref 3.5–5.0)
Albumin: 3 g/dL — ABNORMAL LOW (ref 3.5–5.0)
Alkaline Phosphatase: 72 U/L (ref 38–126)
Anion gap: 12 (ref 5–15)
Anion gap: 8 (ref 5–15)
BILIRUBIN TOTAL: 0.9 mg/dL (ref 0.3–1.2)
BUN: 17 mg/dL (ref 6–20)
BUN: 15 mg/dL (ref 6–20)
CALCIUM: 7.9 mg/dL — AB (ref 8.9–10.3)
CO2: 25 mmol/L (ref 22–32)
CO2: 26 mmol/L (ref 22–32)
CREATININE: 0.92 mg/dL (ref 0.44–1.00)
CREATININE: 1.14 mg/dL — AB (ref 0.44–1.00)
Calcium: 9.2 mg/dL (ref 8.9–10.3)
Chloride: 95 mmol/L — ABNORMAL LOW (ref 101–111)
Chloride: 100 mmol/L — ABNORMAL LOW (ref 101–111)
GFR calc Af Amer: 47 mL/min — ABNORMAL LOW (ref >60)
GFR calc non Af Amer: 41 mL/min — ABNORMAL LOW (ref >60)
GFR, EST NON AFRICAN AMERICAN: 53 mL/min — AB (ref >60)
Glucose, Bld: 132 mg/dL — ABNORMAL HIGH (ref 65–99)
Glucose, Bld: 150 mg/dL — ABNORMAL HIGH (ref 65–99)
Potassium: 4.7 mmol/L (ref 3.5–5.1)
Potassium: 3.6 mmol/L (ref 3.5–5.1)
SODIUM: 133 mmol/L — AB (ref 135–145)
Sodium: 133 mmol/L — ABNORMAL LOW (ref 135–145)
TOTAL PROTEIN: 5.5 g/dL — AB (ref 6.5–8.1)
Total Bilirubin: 0.7 mg/dL (ref 0.3–1.2)
Total Protein: 7.4 g/dL (ref 6.5–8.1)

## 2015-08-14 LAB — TROPONIN I
TROPONIN I: 0.2 ng/mL — AB (ref <0.031)
TROPONIN I: 0.14 ng/mL — AB (ref <0.031)
TROPONIN I: 0.08 ng/mL — AB (ref <0.031)

## 2015-08-14 LAB — URINE MICROSCOPIC-ADD ON

## 2015-08-14 LAB — TSH: TSH: 1.585 u[IU]/mL (ref 0.350–4.500)

## 2015-08-14 LAB — URINALYSIS, ROUTINE W REFLEX MICROSCOPIC
Bilirubin Urine: NEGATIVE
GLUCOSE, UA: NEGATIVE mg/dL
KETONES UR: NEGATIVE mg/dL
LEUKOCYTES UA: NEGATIVE
NITRITE: NEGATIVE
PH: 7.5 (ref 5.0–8.0)
PROTEIN: 30 mg/dL — AB
Specific Gravity, Urine: 1.01 (ref 1.005–1.030)
Urobilinogen, UA: 0.2 mg/dL (ref 0.0–1.0)

## 2015-08-14 LAB — I-STAT TROPONIN, ED: Troponin i, poc: 0 ng/mL (ref 0.00–0.08)

## 2015-08-14 LAB — LACTIC ACID, PLASMA
LACTIC ACID, VENOUS: 1.8 mmol/L (ref 0.5–2.0)
LACTIC ACID, VENOUS: 1.4 mmol/L (ref 0.5–2.0)
Lactic Acid, Venous: 2 mmol/L (ref 0.5–2.0)

## 2015-08-14 LAB — LIPASE, BLOOD: Lipase: 25 U/L (ref 22–51)

## 2015-08-14 LAB — PROCALCITONIN: Procalcitonin: 1.52 ng/mL

## 2015-08-14 LAB — I-STAT CG4 LACTIC ACID, ED: Lactic Acid, Venous: 3.24 mmol/L (ref 0.5–2.0)

## 2015-08-14 LAB — MRSA PCR SCREENING: MRSA by PCR: NEGATIVE

## 2015-08-14 LAB — STREP PNEUMONIAE URINARY ANTIGEN: STREP PNEUMO URINARY ANTIGEN: NEGATIVE

## 2015-08-14 MED ORDER — VANCOMYCIN HCL IN DEXTROSE 750-5 MG/150ML-% IV SOLN
750.0000 mg | INTRAVENOUS | Status: DC
  Administered 2015-08-15: 750 mg via INTRAVENOUS
  Filled 2015-08-14: qty 150

## 2015-08-14 MED ORDER — BARIUM SULFATE 2.1 % PO SUSP
ORAL | Status: AC
  Administered 2015-08-14: 1 mL via ORAL
  Filled 2015-08-14: qty 2

## 2015-08-14 MED ORDER — DILTIAZEM HCL ER COATED BEADS 240 MG PO CP24
240.0000 mg | ORAL_CAPSULE | Freq: Every day | ORAL | Status: DC
  Administered 2015-08-15 – 2015-08-18 (×4): 240 mg via ORAL
  Filled 2015-08-14 (×4): qty 1

## 2015-08-14 MED ORDER — INSULIN ASPART 100 UNIT/ML ~~LOC~~ SOLN: 5.0000 [IU] | Freq: Once | SUBCUTANEOUS | Status: DC

## 2015-08-14 MED ORDER — SODIUM CHLORIDE 0.9 % IV BOLUS (SEPSIS)
500.0000 mL | Freq: Once | INTRAVENOUS | Status: AC
  Administered 2015-08-14: 500 mL via INTRAVENOUS

## 2015-08-14 MED ORDER — ONDANSETRON HCL 4 MG/2ML IJ SOLN: 4.0000 mg | Freq: Four times a day (QID) | INTRAMUSCULAR | Status: DC | PRN

## 2015-08-14 MED ORDER — AZITHROMYCIN 500 MG IV SOLR
500.0000 mg | INTRAVENOUS | Status: DC
Start: 2015-08-14 — End: 2015-08-15
  Administered 2015-08-14 – 2015-08-15 (×2): 500 mg via INTRAVENOUS
  Filled 2015-08-14 (×2): qty 500

## 2015-08-14 MED ORDER — METOPROLOL SUCCINATE ER 50 MG PO TB24
50.0000 mg | ORAL_TABLET | Freq: Every day | ORAL | Status: DC
  Administered 2015-08-14 – 2015-08-17 (×4): 50 mg via ORAL
  Filled 2015-08-14 (×4): qty 1

## 2015-08-14 MED ORDER — VANCOMYCIN HCL IN DEXTROSE 1-5 GM/200ML-% IV SOLN: 1000.0000 mg | INTRAVENOUS | Status: DC

## 2015-08-14 MED ORDER — SERTRALINE HCL 50 MG PO TABS
50.0000 mg | ORAL_TABLET | Freq: Every day | ORAL | Status: DC
  Filled 2015-08-14: qty 1

## 2015-08-14 MED ORDER — DEXTROSE 5 % IV SOLN
2.0000 g | Freq: Once | INTRAVENOUS | Status: AC
  Administered 2015-08-14: 2 g via INTRAVENOUS
  Filled 2015-08-14: qty 2

## 2015-08-14 MED ORDER — OMEGA-3-ACID ETHYL ESTERS 1 G PO CAPS
1000.0000 mg | ORAL_CAPSULE | Freq: Every day | ORAL | Status: DC
  Administered 2015-08-14 – 2015-08-18 (×5): 1000 mg via ORAL
  Filled 2015-08-14 (×10): qty 1

## 2015-08-14 MED ORDER — WARFARIN - PHARMACIST DOSING INPATIENT
Freq: Every day | Status: DC
  Administered 2015-08-16: 18:00:00

## 2015-08-14 MED ORDER — ALBUTEROL SULFATE (2.5 MG/3ML) 0.083% IN NEBU
5.0000 mg | INHALATION_SOLUTION | Freq: Once | RESPIRATORY_TRACT | Status: AC
  Administered 2015-08-14: 5 mg via RESPIRATORY_TRACT
  Filled 2015-08-14: qty 6

## 2015-08-14 MED ORDER — ROSUVASTATIN CALCIUM 10 MG PO TABS
5.0000 mg | ORAL_TABLET | Freq: Every evening | ORAL | Status: DC
  Administered 2015-08-14 – 2015-08-17 (×4): 5 mg via ORAL
  Filled 2015-08-14 (×4): qty 1

## 2015-08-14 MED ORDER — BARIUM SULFATE 2.1 % PO SUSP
ORAL | Status: AC
  Administered 2015-08-14 (×2): 1 mL via ORAL

## 2015-08-14 MED ORDER — SODIUM CHLORIDE 0.9 % IV BOLUS (SEPSIS)
30.0000 mL/kg | Freq: Once | INTRAVENOUS | Status: AC
Start: 2015-08-14 — End: 2015-08-14
  Administered 2015-08-14: 2205 mL via INTRAVENOUS

## 2015-08-14 MED ORDER — CALCIUM CARBONATE 1250 (500 CA) MG PO TABS
1.0000 | ORAL_TABLET | Freq: Every day | ORAL | Status: DC
  Administered 2015-08-14 – 2015-08-18 (×5): 500 mg via ORAL
  Filled 2015-08-14 (×5): qty 1

## 2015-08-14 MED ORDER — DEXTROSE 5 % IV SOLN
1.0000 g | Freq: Three times a day (TID) | INTRAVENOUS | Status: DC
  Administered 2015-08-14 – 2015-08-15 (×3): 1 g via INTRAVENOUS
  Filled 2015-08-14 (×5): qty 1

## 2015-08-14 MED ORDER — PANTOPRAZOLE SODIUM 40 MG PO TBEC
40.0000 mg | DELAYED_RELEASE_TABLET | Freq: Every day | ORAL | Status: DC
  Administered 2015-08-14 – 2015-08-18 (×5): 40 mg via ORAL
  Filled 2015-08-14 (×6): qty 1

## 2015-08-14 MED ORDER — ACETAMINOPHEN 325 MG PO TABS
650.0000 mg | ORAL_TABLET | Freq: Four times a day (QID) | ORAL | Status: DC | PRN
  Administered 2015-08-15: 650 mg via ORAL
  Filled 2015-08-14: qty 2

## 2015-08-14 MED ORDER — VANCOMYCIN HCL IN DEXTROSE 1-5 GM/200ML-% IV SOLN
1000.0000 mg | Freq: Once | INTRAVENOUS | Status: AC
  Administered 2015-08-14: 1000 mg via INTRAVENOUS
  Filled 2015-08-14: qty 200

## 2015-08-14 MED ORDER — VITAMIN D 1000 UNITS PO TABS
1000.0000 [IU] | ORAL_TABLET | Freq: Every day | ORAL | Status: DC
  Administered 2015-08-14 – 2015-08-18 (×5): 1000 [IU] via ORAL
  Filled 2015-08-14 (×5): qty 1

## 2015-08-14 MED ORDER — ACETAMINOPHEN 650 MG RE SUPP
650.0000 mg | Freq: Once | RECTAL | Status: AC
  Administered 2015-08-14: 650 mg via RECTAL
  Filled 2015-08-14: qty 1

## 2015-08-14 MED ORDER — MORPHINE SULFATE (PF) 4 MG/ML IV SOLN
4.0000 mg | Freq: Once | INTRAVENOUS | Status: AC
  Administered 2015-08-14: 4 mg via INTRAVENOUS
  Filled 2015-08-14: qty 1

## 2015-08-14 MED ORDER — WARFARIN SODIUM 5 MG PO TABS
5.0000 mg | ORAL_TABLET | Freq: Once | ORAL | Status: AC
  Administered 2015-08-14: 5 mg via ORAL
  Filled 2015-08-14: qty 1

## 2015-08-14 MED ORDER — SODIUM CHLORIDE 0.9 % IV SOLN
INTRAVENOUS | Status: DC
  Administered 2015-08-14 (×2): via INTRAVENOUS

## 2015-08-14 MED ORDER — ACETAMINOPHEN 650 MG RE SUPP: 650.0000 mg | Freq: Four times a day (QID) | RECTAL | Status: DC | PRN

## 2015-08-14 MED ORDER — ONDANSETRON HCL 4 MG PO TABS: 4.0000 mg | ORAL_TABLET | Freq: Four times a day (QID) | ORAL | Status: DC | PRN

## 2015-08-14 NOTE — Progress Notes (Signed)
ANTICOAGULATION CONSULT NOTE - Initial Consult  Pharmacy Consult for Warfarin  Indication: atrial fibrillation, hx VTE  Allergies  Allergen Reactions  . Contrast Media [Iodinated Diagnostic Agents] Hives  . Levaquin [Levofloxacin] Itching and Rash  . Penicillins Rash    "haven't had it in years"  . Vancomycin Itching and Rash    Patient Measurements: Height: 5\' 5"  (165.1 cm) Weight: 170 lb 3.1 oz (77.2 kg) IBW/kg (Calculated) : 57  Vital Signs: Temp: 98.8 F (37.1 C) (09/01 0446) Temp Source: Oral (09/01 0446) BP: 109/41 mmHg (09/01 0446) Pulse Rate: 75 (09/01 0446)  Labs:  Recent Labs  08/14/15 0045  HGB 13.5  HCT 40.7  PLT 208  CREATININE 1.14*    Estimated Creatinine Clearance: 33 mL/min (by C-G formula based on Cr of 1.14).   Medical History: Past Medical History  Diagnosis Date  . Coronary artery disease     Nonobstructive  . GERD (gastroesophageal reflux disease)   . Headache(784.0)   . High cholesterol   . DVT (deep venous thrombosis), right 2005    "dry blood; after 8 foot fall"  . Chronic bronchitis     "get it basically 3 times/yr"  . Blood transfusion 1960  . H/O hiatal hernia   . Rheumatoid arthritis(714.0)   . Concussion w/o coma 4/76/5465    Complicated by subarachnoid hemorrhage.  "even now has times when she's not able to comprehend" (05/08/12)  . Chronic atrial fibrillation     Anticoagulated with warfarin, rate control with diltiazem and Toprol  . Pneumonia ~ 2010 AND 2013  . 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 Diverticulitis   . Diverticulosis   . External hemorrhoids   . Fatty liver   . AAA (abdominal aortic aneurysm)   . Edema of both legs     Chronic, thought to be secondary to DVTs  . Hx of bipolar disorder     Assessment: Warfarin PTA for chronic afib and hx VTE. CBC good. CrCl ~30. Other labs/meds reviewed.   Goal of Therapy:  INR 2-3 Monitor platelets by  anticoagulation protocol: Yes   Plan:  -Awaiting INR for dosing   Narda Bonds 08/14/2015,4:52 AM

## 2015-08-14 NOTE — Progress Notes (Signed)
ANTIBIOTIC CONSULT NOTE - INITIAL  Pharmacy Consult for Vancomycin/Aztreonam Indication: rule out pneumonia  Allergies  Allergen Reactions  . Contrast Media [Iodinated Diagnostic Agents] Hives  . Levaquin [Levofloxacin] Itching and Rash  . Penicillins Rash    "haven't had it in years"  . Vancomycin Itching and Rash    Patient Measurements: Height: 5' 4.5" (163.8 cm) Weight: 162 lb (73.483 kg) IBW/kg (Calculated) : 55.85 Vital Signs: Temp: 101.7 F (38.7 C) (09/01 0253) Temp Source: Rectal (09/01 0253) BP: 109/41 mmHg (09/01 0446) Pulse Rate: 75 (09/01 0446) Intake/Output from previous day:   Intake/Output from this shift:    Labs:  Recent Labs  08/14/15 0045  WBC 14.4*  HGB 13.5  PLT 208  CREATININE 1.14*   Estimated Creatinine Clearance: 31.9 mL/min (by C-G formula based on Cr of 1.14).  Medical History: Past Medical History  Diagnosis Date  . Coronary artery disease     Nonobstructive  . GERD (gastroesophageal reflux disease)   . Headache(784.0)   . High cholesterol   . DVT (deep venous thrombosis), right 2005    "dry blood; after 8 foot fall"  . Chronic bronchitis     "get it basically 3 times/yr"  . Blood transfusion 1960  . H/O hiatal hernia   . Rheumatoid arthritis(714.0)   . Concussion w/o coma 4/65/6812    Complicated by subarachnoid hemorrhage.  "even now has times when she's not able to comprehend" (05/08/12)  . Chronic atrial fibrillation     Anticoagulated with warfarin, rate control with diltiazem and Toprol  . Pneumonia ~ 2010 AND 2013  . 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 Diverticulitis   . Diverticulosis   . External hemorrhoids   . Fatty liver   . AAA (abdominal aortic aneurysm)   . Edema of both legs     Chronic, thought to be secondary to DVTs  . Hx of bipolar disorder     Assessment: CXR with CHF vs infectious infiltrates, WBC mildly elevated, CrCl ~30, other labs  meds/reviewed, starting broad spectrum anti-biotics.   Goal of Therapy:  Vancomycin trough level 15-20 mcg/ml  Plan:  -Vancomycin 750 mg IV q24h -Aztreonam 1g IV q8h -Azithromycin per MD for atypical coverage -Drug levels as indicated   Narda Bonds 08/14/2015,4:47 AM

## 2015-08-14 NOTE — ED Notes (Signed)
Family has pts rings, bracelets, watch, and personal belongings.

## 2015-08-14 NOTE — Progress Notes (Signed)
Patients SBP was in the 70's-80's with the MAP of 40's -50's, patient alert when awake but intermittently sleepy but arousable.  Rema Jasmine was notified  With orders made 531ml. IV bolus on going will continue to monitor .

## 2015-08-14 NOTE — H&P (Addendum)
[facility] History and Physical  ALBERTO SCHOCH EZM:[id number] DOB: July 05, 1921 DOA: 08/14/2015  Referring physician: Yvone Neu. PCP: [name], MD  Specialists: [place].  Chief Complaint: Fever and chills.  HPI: [name] is a 81 y.o. female with history of chronic atrial fibrillation, LV dysfunction, abdominal aortic aneurysm, peripheral vascular disease and previous history of DVT was brought to the ER after patient had a sudden onset of fever chills last night around 11 PM. Patient also had one episode of nausea and vomiting and has been having persistent cough since he came to the ER. Patient was initially tachycardic with fever of 103F. Patient appeared mildly confused and was complaining of some chest tightness on arrival. Presently patient is chest pain-free. On exam patient also has left lower extremity erythema concerning for cellulitis. Lactic acid was elevated. Blood cultures were obtained and patient has been started on empiric antibiotics for sepsis possibly secondary to cellulitis of the left lower lobectomy and possible pneumonia. Chest x-ray shows congestion versus infiltrates.  Review of Systems: As presented in the history of presenting illness, rest negative.  Past Medical History  Diagnosis Date  . Coronary artery disease     Nonobstructive  . GERD (gastroesophageal reflux disease)   . Headache(784.0)   . High cholesterol   . DVT (deep venous thrombosis), right 2005    "dry blood; after 8 foot fall"  . Chronic bronchitis     "get it basically 3 times/yr"  . Blood transfusion 1960  . H/O hiatal hernia   . Rheumatoid arthritis(714.0)   . Concussion w/o coma 04/14/5464    Complicated by subarachnoid hemorrhage.  "even now has times when she's not able to comprehend" (05/08/12)  . Chronic atrial fibrillation     Anticoagulated with warfarin, rate control with diltiazem and Toprol  . Pneumonia ~ 2010 AND 2013  . Bladder cancer dx'd 2011   Chronic microscopic hematuria; transitional cell cancer  . Diverticulitis   . Diverticulosis   . External hemorrhoids   . Fatty liver   . AAA (abdominal aortic aneurysm)   . Edema of both legs     Chronic, thought to be secondary to DVTs  . Hx of bipolar disorder    Past Surgical History  Procedure Laterality Date  . Transurethral resection of bladder tumor  11/11/2011    Procedure: TRANSURETHRAL RESECTION OF BLADDER TUMOR (TURBT);  Surgeon: Malka So;  Location: [facility];  Service: Urology;  Laterality: N/A;  Cysto, Bladder Biopsy, TURBT with Gyrus,   . Cystoscopy  11/11/2011    Procedure: CYSTOSCOPY;  Surgeon: Malka So;  Location: [facility];  Service: Urology;  Laterality: N/A;  . Bladder lift  YRS AGO  . Bladder cancer  2010; 2009    "for tumors on surface of bladder"  . Knee arthroscopy  2005    right; S/P fall  . Cataract extraction w/ intraocular lens  implant, bilateral Bilateral 1992  . Back surgery  1976 UPPER BACK    tumor removed-benign  . Tonsillectomy  1952  . Breast cyst excision  YRS AGO    2 cysts removed-left breast-benign  . Cardiac catheterization  1980's  . Abdominal hysterectomy  1960    partial hysterectomy  . Left thumb  benign tumor removed  1980'S  . Total knee arthroplasty Right 02/05/2013    Procedure: TOTAL KNEE ARTHROPLASTY;  Surgeon: Mauri Pole, MD;  Location: [facility];  Service: Orthopedics;  Laterality: Right;  . Cystostomy w/ bladder biopsy  10/08/2013  . Lower extremity venous doppler  11/10/2013    No DVT or superficial thrombus enlarged inguinal lymph node noted in the right. No Baker's cyst.   Social History:  reports that she quit smoking about 32 years ago. Her smoking use included Cigarettes. She has a 50 pack-year smoking history. She has never used smokeless tobacco. She reports that she drinks about 4.2 oz of alcohol per week. She reports that she does not use illicit drugs. Where does patient live home. Can patient participate in  ADLs? Yes.  Allergies  Allergen Reactions  . Contrast Media [Iodinated Diagnostic Agents] Hives  . Levaquin [Levofloxacin] Itching and Rash  . Penicillins Rash    "haven't had it in years"  . Vancomycin Itching and Rash    Family History:  Family History  Problem Relation Age of Onset  . Anesthesia problems Daughter   . Prostate cancer Brother   . Breast cancer Other     neice  . Heart disease Brother   . Colon cancer Neg Hx       Prior to Admission medications   Medication Sig Start Date End Date Taking? Authorizing Provider  acetaminophen (TYLENOL) 500 MG tablet Take 1,000 mg by mouth every 6 (six) hours as needed for pain.   Yes Historical Provider, MD  calcium carbonate (OS-CAL - DOSED IN MG OF ELEMENTAL CALCIUM) 1250 MG tablet Take 1 tablet by mouth every morning.    Yes Historical Provider, MD  cholecalciferol (VITAMIN D) 1000 UNITS tablet Take 1,000 Units by mouth daily.   Yes Historical Provider, MD  Cinnamon 500 MG capsule Take 1,000 mg by mouth daily.   Yes Historical Provider, MD  diltiazem (CARDIZEM CD) 240 MG 24 hr capsule Take 240 mg by mouth daily. 11/02/13  Yes Historical Provider, MD  furosemide (LASIX) 40 MG tablet Take 40-80 mg by mouth daily. 2 (80mg ) tablets on mon,wed and Friday. All other days 1 tablet (40mg )   Yes Historical Provider, MD  metoprolol (TOPROL-XL) 50 MG 24 hr tablet Take 50 mg by mouth at bedtime.    Yes Historical Provider, MD  Multiple Vitamins-Minerals (MULTIVITAMINS THER. W/MINERALS) TABS Take 1 tablet by mouth daily.    Yes Historical Provider, MD  Omega-3 Fatty Acids (FISH OIL) 1000 MG CAPS Take 1,000 mg by mouth daily.   Yes Historical Provider, MD  omeprazole (PRILOSEC) 20 MG capsule Take 20 mg by mouth 2 (two) times daily.   Yes Historical Provider, MD  psyllium (METAMUCIL) 58.6 % powder Take 1 packet by mouth 2 (two) times daily.    Yes Historical Provider, MD  rosuvastatin (CRESTOR) 5 MG tablet Take 5 mg by mouth every evening.     Yes Historical Provider, MD  sertraline (ZOLOFT) 50 MG tablet  10/29/14  Yes Historical Provider, MD  spironolactone (ALDACTONE) 25 MG tablet Take 25 mg by mouth every morning.   Yes Historical Provider, MD  warfarin (COUMADIN) 5 MG tablet Take 5-7.5 mg by mouth daily. Take 1.5  Tablet (7.5) on mon,wed,friday. All other days take 1 tablet (5mg ) 05/23/14  Yes Historical Provider, MD    Physical Exam: Filed Vitals:   08/14/15 0230 08/14/15 0245 08/14/15 0253 08/14/15 0315  BP: 126/52 129/53  115/44  Pulse: 110 109  102  Temp:   101.7 F (38.7 C)   TempSrc:   Rectal   Resp: 23 22  22   Height:      Weight:      SpO2: 94% 96%  95%  General:  Moderately built and nourished.  Eyes: anicteric no pallor.  ENT: no discharge from the ears eyes nose and mouth.  Neck: no mass felt. No JVD appreciated.  Cardiovascular: S1-S2 heard.  Respiratory: no rhonchi or crepitations.  Abdomen: mild epigastric tenderness no guarding or rigidity. Patient's family states is chronic.  Skin: left lower extremity erythema extending from the ankle to the knee.  Musculoskeletal: at about the left lower extremity more than the right.  Psychiatric: appears normal.  Neurologic: alert awake oriented to time place and person. Moves all extremities.  Labs on Admission:  Basic Metabolic Panel:  Recent Labs Lab 08/14/15 0045  NA 133*  K 4.7  CL 95*  CO2 26  GLUCOSE 132*  BUN 17  CREATININE 1.14*  CALCIUM 9.2   Liver Function Tests:  Recent Labs Lab 08/14/15 0045  AST 35  ALT 26  ALKPHOS 72  BILITOT 0.7  PROT 7.4  ALBUMIN 4.1   No results for input(s): LIPASE, AMYLASE in the last 168 hours. No results for input(s): AMMONIA in the last 168 hours. CBC:  Recent Labs Lab 08/14/15 0045  WBC 14.4*  NEUTROABS 10.8*  HGB 13.5  HCT 40.7  MCV 100.2*  PLT 208   Cardiac Enzymes: No results for input(s): CKTOTAL, CKMB, CKMBINDEX, TROPONINI in the last 168 hours.  BNP (last 3  results) No results for input(s): BNP in the last 8760 hours.  ProBNP (last 3 results) No results for input(s): PROBNP in the last 8760 hours.  CBG:  Recent Labs Lab 08/14/15 0314  GLUCAP 156*    Radiological Exams on Admission: Dg Chest Portable 1 View  08/14/2015   CLINICAL DATA:  Dyspnea and cough.  Fever.  EXAM: PORTABLE CHEST - 1 VIEW  COMPARISON:  11/25/2013  FINDINGS: There are central ground-glass opacities and patchy basilar opacities which are new from 11/25/2013. There is mild cardiomegaly. There is mild vascular and interstitial prominence. The findings may represent congestive heart failure.  IMPRESSION: Mild central ground-glass opacities and patchy central basilar opacity, as well as vascular and interstitial prominence. This may represent congestive heart failure although infectious infiltrates cannot be entirely excluded.   Electronically Signed   By: Andreas Newport M.D.   On: 08/14/2015 01:49    EKG: Independently reviewed. Atrial fibrillation rate control.  Assessment/Plan Principal Problem:   Sepsis Active Problems:   Chronic atrial fibrillation   Hypokalemia   Left ventricular diastolic dysfunction, NYHA class 2   AAA (abdominal aortic aneurysm)   Cellulitis of left lower extremity   ARF (acute renal failure)   1. Sepsis - possible sources left lower extremities cellulitis and also possible pneumonia. Patient has remained clinically placed on vancomycin and Azactam and Zithromax. Follow blood cultures procalcitonin levels and lactic acid levels. Check urine Legionella and strep antigen. Patient is on IV fluids and will cautiously hydrate given history of LV dysfunction and patient being on diuretics. 2. Acute renal failure - probably from dehydration from nausea vomiting. Holding off diuretics now and gently hydrating. Follow intake output and metabolic panel. 3. Nausea vomiting - had one episode of nausea vomiting. There is mild tenderness in the epigastric  area. Check sonogram of abdomen. Check lipase. Follow LFTs.(HAS CONTRAST MEDIA ALLERGY). 4. Chronic atrial fibrillation - presently rate controlled. Chads 2 vasc score is 4. Patient is on Coumadin. Continue rate limiting medication if blood pressure permits. 5. History of LV dysfunction last EF measured in January 2015 was 60-65% (improved from previous) - I'm holding  all patients Lasix and spironolactone since patient is getting hydration. Closely follow respiratory status. 6. Abdominal aortic aneurysm - patient has mild epigastric tenderness which patient's family and patient states is chronic. Will check sonogram of abdomen for the aortic aneurysm size.(Has contrast media allergy). 7. History of septic shock, unknown source in December 2014.  I have reviewed patient's old chart and labs. Personally reviewed chest x-ray and EKG.   DVT ProphylaxisCoumadin.  Code Status: full code.  Family Communication: discussed with patient's daughters at the bedside.  Disposition Plan: admit to inpatient.    , N. [facility] Pager [phone].  If 7PM-7AM, please contact night-coverage [email or web address] Password [facility] 08/14/2015, 4:39 AM

## 2015-08-14 NOTE — ED Notes (Signed)
Per ems-- pt from home c/o sudden onset chills and sob. o2 sts 90% on RA. Ems administered neb tx. Pt has had this before when she was in septic shock. Pt presents in a-fib. Pt A&OX4.

## 2015-08-14 NOTE — Progress Notes (Signed)
[facility] PROGRESS NOTE  [name] OBS:[id number] DOB: Nov 09, 1921 DOA: 08/14/2015 PCP: [name], MD  Assessment/Plan: 1. Sepsis with shock 1. Unclear source. Pulmonary vs cellulitis vs ?abdominal source 2. Pt is continued on empiric vanc and aztreonam 3. Pan cultures pending 4. Leukocytosis of 13k, presented with fever of 101.11F rectally with tachycardia and lactic acidosis 5. Cont abx per above. Pt has shown improvement thus far 2. ARF 1. Cr improved with IVF 2. Resolved. Cont monitor 3. Nausea/vomiting/abd pain 1. Pain mainly epigastric 2. Given septic picture, will obtain CT abd/pelvis w/o contrast given contrast allergy 3. abd US unremarkable 4. Chronic afib 1. Stable, currently rate controlled 2. On coumadin, dosing per pharmacy 5. Hx LV dysfunction 1. Last documented EF of 60-65% in 2015 6. AAA 1. Stable 3.4cm infrarenal AAA on [place] 7. DVT prophylaxis 1. coumadin  Code Status: Full Family Communication: Pt in room (indicate person spoken with, relationship, and if by phone, the number) Disposition Plan: Pending   Consultants:    Procedures:    Antibiotics:  Vancomycin 8/31>>>  Aztreonam 8/31>>> (indicate start date, and stop date if known)  HPI/Subjective: Reports feeling better today.  Objective: Filed Vitals:   08/14/15 1500 08/14/15 1515 08/14/15 1600 08/14/15 1700  BP: 110/42  134/55 120/45  Pulse: 87 83 97 92  Temp:   98.5 F (36.9 C)   TempSrc:   Oral   Resp: 20 23 22 20   Height:      Weight:      SpO2: 96% 97% 97% 98%    Intake/Output Summary (Last 24 hours) at 08/14/15 1730 Last data filed at 08/14/15 1700  Gross per 24 hour  Intake   2090 ml  Output   1800 ml  Net    290 ml   Filed Weights   08/14/15 0039 08/14/15 0446  Weight: 73.483 kg (162 lb) 77.2 kg (170 lb 3.1 oz)    Exam:   General:  Awake, in nad  Cardiovascular: regular, s1, s2  Respiratory: normal resp effort, no wheezing  Abdomen: soft,  generally tender, mainly in epigastric region  Musculoskeletal: perfused, no clubbing, LLE with erythema and warmth    Data Reviewed: Basic Metabolic Panel:  Recent Labs Lab 08/14/15 0045 08/14/15 0525  NA 133* 133*  K 4.7 3.6  CL 95* 100*  CO2 26 25  GLUCOSE 132* 150*  BUN 17 15  CREATININE 1.14* 0.92  CALCIUM 9.2 7.9*   Liver Function Tests:  Recent Labs Lab 08/14/15 0045 08/14/15 0525  AST 35 27  ALT 26 22  ALKPHOS 72 54  BILITOT 0.7 0.9  PROT 7.4 5.5*  ALBUMIN 4.1 3.0*    Recent Labs Lab 08/14/15 1104  LIPASE 25   No results for input(s): AMMONIA in the last 168 hours. CBC:  Recent Labs Lab 08/14/15 0045 08/14/15 0525  WBC 14.4* 13.2*  NEUTROABS 10.8* 11.0*  HGB 13.5 10.9*  HCT 40.7 32.8*  MCV 100.2* 100.0  PLT 208 160   Cardiac Enzymes:  Recent Labs Lab 08/14/15 0525 08/14/15 1104  TROPONINI 0.20* 0.14*   BNP (last 3 results) No results for input(s): BNP in the last 8760 hours.  ProBNP (last 3 results) No results for input(s): PROBNP in the last 8760 hours.  CBG:  Recent Labs Lab 08/14/15 0314  GLUCAP 156*    Recent Results (from the past 240 hour(s))  MRSA PCR Screening     Status: None   Collection Time: 08/14/15  4:30 AM  Result  Value Ref Range Status   MRSA by PCR NEGATIVE NEGATIVE Final    Comment:        The GeneXpert MRSA Assay (FDA approved for NASAL specimens only), is one component of a comprehensive MRSA colonization surveillance program. It is not intended to diagnose MRSA infection nor to guide or monitor treatment for MRSA infections.      Studies: US Abdomen Complete  08/14/2015   CLINICAL DATA:  81 year old female with nausea and abdominal aortic aneurysm.  EXAM: ULTRASOUND ABDOMEN COMPLETE  COMPARISON:  05/20/2015 abdominal sonogram. 08/22/2014 CT abdomen/pelvis.  FINDINGS: Gallbladder: No gallstones or sludge are detected in the gallbladder (the previously described 6 mm gallstone on the 05/20/2015  sonogram is not visualized on today's scan). The gallbladder wall is top-normal in thickness. There is no significant gallbladder distention. No pericholecystic fluid or sonographic Murphy sign.  Common bile duct: Diameter: 4 mm  Liver: Liver appears normal in size and configuration. Liver parenchymal echogenicity and echotexture are within normal limits. No liver mass is detected. The main portal vein is patent with appropriate flow direction.  IVC: No abnormality visualized.  Pancreas: Visualized portion unremarkable.  Spleen: Size and appearance within normal limits.  Right Kidney: Length: 11.6 cm. Echogenicity within normal limits. No mass or hydronephrosis visualized.  Left Kidney: Length: 11.1 cm. Echogenicity within normal limits. No hydronephrosis. Exophytic simple 6.6 x 6.1 x 6.3 cm renal cyst in the interpolar left kidney, previously 6.2 x 6.1 x 6.1 cm, minimally increased. No new left renal lesions.  Abdominal aorta: There is a stable infrarenal abdominal aortic aneurysm measuring 3.4 cm in maximum AP diameter, not appreciably changed compared to the 08/22/2014 CT study.  Other findings: None.  IMPRESSION: 1. No cholelithiasis or gallbladder sludge detected on today's scan. No evidence of acute cholecystitis. No biliary ductal dilatation. 2. Stable 3.4 cm infrarenal abdominal aortic aneurysm. 3. Stable large simple left renal cyst. Otherwise normal abdominal sonogram.   Electronically Signed   By: Ilona Sorrel M.D.   On: 08/14/2015 11:30   Dg Chest Portable 1 View  08/14/2015   CLINICAL DATA:  Dyspnea and cough.  Fever.  EXAM: PORTABLE CHEST - 1 VIEW  COMPARISON:  11/25/2013  FINDINGS: There are central ground-glass opacities and patchy basilar opacities which are new from 11/25/2013. There is mild cardiomegaly. There is mild vascular and interstitial prominence. The findings may represent congestive heart failure.  IMPRESSION: Mild central ground-glass opacities and patchy central basilar opacity, as  well as vascular and interstitial prominence. This may represent congestive heart failure although infectious infiltrates cannot be entirely excluded.   Electronically Signed   By: Andreas Newport M.D.   On: 08/14/2015 01:49    Scheduled Meds: . azithromycin  500 mg Intravenous Q24H  . aztreonam  1 g Intravenous 3 times per day  . calcium carbonate  1 tablet Oral Daily  . cholecalciferol  1,000 Units Oral Daily  . diltiazem  240 mg Oral Daily  . metoprolol succinate  50 mg Oral QHS  . omega-3 acid ethyl esters  1,000 mg Oral Daily  . pantoprazole  40 mg Oral Daily  . rosuvastatin  5 mg Oral QPM  . [START ON 08/15/2015] vancomycin  750 mg Intravenous Q24H  . warfarin  5 mg Oral ONCE-1800  . Warfarin - Pharmacist Dosing Inpatient   Does not apply q1800   Continuous Infusions: . sodium chloride 75 mL/hr at 08/14/15 1423    Principal Problem:   Sepsis Active Problems:  Chronic atrial fibrillation   Hypokalemia   Left ventricular diastolic dysfunction, NYHA class 2   AAA (abdominal aortic aneurysm)   Cellulitis of left lower extremity   ARF (acute renal failure)    [facility], [place] [facility] Pager [phone]. If 7PM-7AM, please contact night-coverage at [email or web address], password [facility] 08/14/2015, 5:30 PM  LOS: 0 days

## 2015-08-14 NOTE — ED Provider Notes (Signed)
CSN: [id number]     Arrival date & time 08/14/15  0020 History  This chart was scribed for Jola Schmidt, MD by Meriel Pica, ED Scribe. This patient was seen in room A06C/A06C and the patient's care was started 1:01 AM.   Chief Complaint  Patient presents with  . Respiratory Distress   The history is provided by the EMS personnel and the patient. No language interpreter was used.   HPI Comments: [name] is a 81 y.o. female, with a PMhx of sepsis, AAA, Afib, and CAD, brought in by ambulance, who presents to the Emergency Department complaining of sudden onset, constant, moderate shaking and chills prompting EMS call out. Current temp of 103.45F. Pt also reports abdominal pain, a burning pain across shoulder blades into neck, allergies and sneezing, pain in BLE with pain worse in left leg. Left lower leg is erythematous and warm to the touch. Pt has vomited once since being in ED. Daughter notes pt is taking coumadin for Afib. PMhx of AAA; last US showed no enlargement of aneurysm. Pt hospitalized 1 year ago for septic shock. Pt was prescribed vancomycin for previous septic shock, daughter notes no allergy to vancomycin. Allergy to Penicillin and contrast dye.   Past Medical History  Diagnosis Date  . Coronary artery disease     Nonobstructive  . GERD (gastroesophageal reflux disease)   . Headache(784.0)   . High cholesterol   . DVT (deep venous thrombosis), right 2005    "dry blood; after 8 foot fall"  . Chronic bronchitis     "get it basically 3 times/yr"  . Blood transfusion 1960  . H/O hiatal hernia   . Rheumatoid arthritis(714.0)   . Concussion w/o coma 9/67/5916    Complicated by subarachnoid hemorrhage.  "even now has times when she's not able to comprehend" (05/08/12)  . Chronic atrial fibrillation     Anticoagulated with warfarin, rate control with diltiazem and Toprol  . Pneumonia ~ 2010 AND 2013  . Bladder cancer dx'd 2011    Chronic microscopic hematuria;  transitional cell cancer  . Diverticulitis   . Diverticulosis   . External hemorrhoids   . Fatty liver   . AAA (abdominal aortic aneurysm)   . Edema of both legs     Chronic, thought to be secondary to DVTs  . Hx of bipolar disorder    Past Surgical History  Procedure Laterality Date  . Transurethral resection of bladder tumor  11/11/2011    Procedure: TRANSURETHRAL RESECTION OF BLADDER TUMOR (TURBT);  Surgeon: Malka So;  Location: [facility];  Service: Urology;  Laterality: N/A;  Cysto, Bladder Biopsy, TURBT with Gyrus,   . Cystoscopy  11/11/2011    Procedure: CYSTOSCOPY;  Surgeon: Malka So;  Location: [facility];  Service: Urology;  Laterality: N/A;  . Bladder lift  YRS AGO  . Bladder cancer  2010; 2009    "for tumors on surface of bladder"  . Knee arthroscopy  2005    right; S/P fall  . Cataract extraction w/ intraocular lens  implant, bilateral Bilateral 1992  . Back surgery  1976 UPPER BACK    tumor removed-benign  . Tonsillectomy  1952  . Breast cyst excision  YRS AGO    2 cysts removed-left breast-benign  . Cardiac catheterization  1980's  . Abdominal hysterectomy  1960    partial hysterectomy  . Left thumb  benign tumor removed  1980'S  . Total knee arthroplasty Right 02/05/2013    Procedure: TOTAL  KNEE ARTHROPLASTY;  Surgeon: Mauri Pole, MD;  Location: [facility];  Service: Orthopedics;  Laterality: Right;  . Cystostomy w/ bladder biopsy  10/08/2013  . Lower extremity venous doppler  11/10/2013    No DVT or superficial thrombus enlarged inguinal lymph node noted in the right. No Baker's cyst.   Family History  Problem Relation Age of Onset  . Anesthesia problems Daughter   . Prostate cancer Brother   . Breast cancer Other     neice  . Heart disease Brother   . Colon cancer Neg Hx    Social History  Substance Use Topics  . Smoking status: Former Smoker -- 1.00 packs/day for 50 years    Types: Cigarettes    Quit date: 11/07/1982  . Smokeless tobacco: Never  Used  . Alcohol Use: 4.2 oz/week    7 Glasses of wine per week   OB History    No data available     Review of Systems  Constitutional: Positive for fever and chills.  HENT: Positive for sneezing.   Gastrointestinal: Positive for vomiting and abdominal pain.  Musculoskeletal: Positive for arthralgias (bilateral shoulders, left leg) and neck pain.  Skin: Positive for color change ( erythema LLE).  All other systems reviewed and are negative.  Allergies  Contrast media; Levaquin; Penicillins; and Vancomycin  Home Medications   Prior to Admission medications   Medication Sig Start Date End Date Taking? Authorizing Provider  acetaminophen (TYLENOL) 500 MG tablet Take 1,000 mg by mouth every 6 (six) hours as needed for pain.    Historical Provider, MD  calcium carbonate (OS-CAL - DOSED IN MG OF ELEMENTAL CALCIUM) 1250 MG tablet Take 1 tablet by mouth every morning.     Historical Provider, MD  cholecalciferol (VITAMIN D) 1000 UNITS tablet Take 1,000 Units by mouth daily.    Historical Provider, MD  Cinnamon 500 MG capsule Take 1,000 mg by mouth daily.    Historical Provider, MD  diltiazem (CARDIZEM CD) 240 MG 24 hr capsule Take 240 mg by mouth daily. 11/02/13   Historical Provider, MD  furosemide (LASIX) 40 MG tablet Take 40-80 mg by mouth daily. 2 (80mg ) tablets on mon,wed and Friday. All other days 1 tablet (40mg )    Historical Provider, MD  metoprolol (TOPROL-XL) 50 MG 24 hr tablet Take 50 mg by mouth at bedtime.     Historical Provider, MD  Multiple Vitamins-Minerals (MULTIVITAMINS THER. W/MINERALS) TABS Take 1 tablet by mouth daily.     Historical Provider, MD  Omega-3 Fatty Acids (FISH OIL) 1000 MG CAPS Take 1,000 mg by mouth daily.    Historical Provider, MD  omeprazole (PRILOSEC) 20 MG capsule Take 20 mg by mouth 2 (two) times daily.    Historical Provider, MD  psyllium (METAMUCIL) 58.6 % powder Take 1 packet by mouth 2 (two) times daily.     Historical Provider, MD  rosuvastatin  (CRESTOR) 5 MG tablet Take 5 mg by mouth every evening.     Historical Provider, MD  sertraline (ZOLOFT) 50 MG tablet  10/29/14   Historical Provider, MD  spironolactone (ALDACTONE) 25 MG tablet Take 25 mg by mouth every morning.    Historical Provider, MD  warfarin (COUMADIN) 5 MG tablet Take 5-7.5 mg by mouth daily. 1 tablet(5mg ) on mon,wed,friday. All other days take 1.5 tablets (7.5mg ) 05/23/14   Historical Provider, MD   BP 137/52 mmHg  Pulse 86  Temp(Src) 103.8 F (39.9 C) (Rectal)  Resp 20  Ht 5' 4.5" (1.638 m)  Wt 162 lb (73.483 kg)  BMI 27.39 kg/m2  SpO2 94% Physical Exam  Constitutional: She is oriented to person, place, and time. She appears well-developed and well-nourished. No distress.  HENT:  Head: Normocephalic and atraumatic.  Eyes: EOM are normal.  Neck: Normal range of motion.  Cardiovascular: Normal rate, regular rhythm and normal heart sounds.   Pulmonary/Chest: Effort normal and breath sounds normal.  Abdominal: Soft. She exhibits no distension. There is no tenderness.  Musculoskeletal: Normal range of motion. She exhibits tenderness.  Normal TP/DP pulses bilaterally; diffuse erythema warmth and tenderness of LLE to the level of the knee.   Neurological: She is alert and oriented to person, place, and time.  Skin: Skin is warm and dry.  Psychiatric: She has a normal mood and affect. Judgment normal.  Nursing note and vitals reviewed.   ED Course  Procedures  DIAGNOSTIC STUDIES: Oxygen Saturation is 94% on [place] 2L, normal by my interpretation.    COORDINATION OF CARE: 1:08 AM Discussed treatment plan with family. Family acknowledges and agrees to plan.   Labs Review Labs Reviewed  CBC WITH DIFFERENTIAL/PLATELET - Abnormal; Notable for the following:    WBC 14.4 (*)    MCV 100.2 (*)    Neutro Abs 10.8 (*)    All other components within normal limits  I-STAT CG4 LACTIC ACID, ED - Abnormal; Notable for the following:    Lactic Acid, Venous 3.24 (*)     All other components within normal limits  CULTURE, BLOOD (ROUTINE X 2)  CULTURE, BLOOD (ROUTINE X 2)  URINE CULTURE  COMPREHENSIVE METABOLIC PANEL  URINALYSIS, ROUTINE W REFLEX MICROSCOPIC (NOT AT [facility])  I-STAT TROPOININ, ED    US Abdomen Complete  08/14/2015   CLINICAL DATA:  81 year old female with nausea and abdominal aortic aneurysm.  EXAM: ULTRASOUND ABDOMEN COMPLETE  COMPARISON:  05/20/2015 abdominal sonogram. 08/22/2014 CT abdomen/pelvis.  FINDINGS: Gallbladder: No gallstones or sludge are detected in the gallbladder (the previously described 6 mm gallstone on the 05/20/2015 sonogram is not visualized on today's scan). The gallbladder wall is top-normal in thickness. There is no significant gallbladder distention. No pericholecystic fluid or sonographic Murphy sign.  Common bile duct: Diameter: 4 mm  Liver: Liver appears normal in size and configuration. Liver parenchymal echogenicity and echotexture are within normal limits. No liver mass is detected. The main portal vein is patent with appropriate flow direction.  IVC: No abnormality visualized.  Pancreas: Visualized portion unremarkable.  Spleen: Size and appearance within normal limits.  Right Kidney: Length: 11.6 cm. Echogenicity within normal limits. No mass or hydronephrosis visualized.  Left Kidney: Length: 11.1 cm. Echogenicity within normal limits. No hydronephrosis. Exophytic simple 6.6 x 6.1 x 6.3 cm renal cyst in the interpolar left kidney, previously 6.2 x 6.1 x 6.1 cm, minimally increased. No new left renal lesions.  Abdominal aorta: There is a stable infrarenal abdominal aortic aneurysm measuring 3.4 cm in maximum AP diameter, not appreciably changed compared to the 08/22/2014 CT study.  Other findings: None.  IMPRESSION: 1. No cholelithiasis or gallbladder sludge detected on today's scan. No evidence of acute cholecystitis. No biliary ductal dilatation. 2. Stable 3.4 cm infrarenal abdominal aortic aneurysm. 3. Stable large simple  left renal cyst. Otherwise normal abdominal sonogram.   Electronically Signed   By: Ilona Sorrel M.D.   On: 08/14/2015 11:30   Dg Chest Portable 1 View  08/14/2015   CLINICAL DATA:  Dyspnea and cough.  Fever.  EXAM: PORTABLE CHEST - 1 VIEW  COMPARISON:  11/25/2013  FINDINGS: There are central ground-glass opacities and patchy basilar opacities which are new from 11/25/2013. There is mild cardiomegaly. There is mild vascular and interstitial prominence. The findings may represent congestive heart failure.  IMPRESSION: Mild central ground-glass opacities and patchy central basilar opacity, as well as vascular and interstitial prominence. This may represent congestive heart failure although infectious infiltrates cannot be entirely excluded.   Electronically Signed   By: Andreas Newport M.D.   On: 08/14/2015 01:49    I have personally reviewed and evaluated these images and lab results as part of my medical decision-making.   EKG Interpretation None      MDM   Final diagnoses:  Fever, unspecified fever cause  Cellulitis of left lower extremity   Patient with likely sepsis on arrival with obvious cellulitis of the left lower extremity.  Questionable infiltrate noted on chest x-ray.  Patient given broad-spectrum antibiotics which will cover both on arrival to the emergency department.  IV fluid resuscitation.  Elevated lactate.  Patient will be admitted to the stepdown unit.   I personally performed the services described in this documentation, which was scribed in my presence. The recorded information has been reviewed and is accurate.      Jola Schmidt, MD 08/15/15 [phone]

## 2015-08-14 NOTE — Care Management Note (Signed)
Case Management Note  Patient Details  Name: [name] MRN: [id number] Date of Birth: 1921-11-27  Subjective/Objective:        Adm w sepsis            Action/Plan: lives w fam, pcp dr [name]   Expected Discharge Date:                 Expected Discharge Plan:     In-House Referral:     Discharge planning Services     Post Acute Care Choice:    Choice offered to:     DME Arranged:    DME Agency:     HH Arranged:    [place] Agency:     Status of Service:     Medicare Important Message Given:    Date Medicare IM Given:    Medicare IM give by:    Date Additional Medicare IM Given:    Additional Medicare Important Message give by:     If discussed at [place] of Stay Meetings, dates discussed:    Additional Comments:  Lacretia Leigh, RN 08/14/2015, 9:05 AM

## 2015-08-14 NOTE — Progress Notes (Signed)
Pt transported to and from CT with RN with no complications. VSS. Will continue to monitor.

## 2015-08-14 NOTE — ED Notes (Signed)
Pt states febrile, neck stiffness,

## 2015-08-14 NOTE — ED Notes (Signed)
Family at bedside. 

## 2015-08-14 NOTE — Progress Notes (Signed)
[facility] for Warfarin  Indication: atrial fibrillation, hx VTE  Labs:  Recent Labs  08/14/15 0045 08/14/15 0525  HGB 13.5 10.9*  HCT 40.7 32.8*  PLT 208 160  LABPROT  --  28.0*  INR  --  2.66*  CREATININE 1.14* 0.92  TROPONINI  --  0.20*    Estimated Creatinine Clearance: 40.9 mL/min (by C-G formula based on Cr of 0.92).  Assessment: Warfarin PTA for chronic afib and hx VTE.   INR within therapeutic range this am at 2.66. No bleeding has been noted however hgb is down from 13>>10.9(possibly dilutional).   PTA warfarin dose is 5mg  daily except 7.5mg  on MWF  Patient started on IV abx but no definite drug interactions noted. Will continue home dose of coumadin for now.   Goal of Therapy:  INR 2-3 Monitor platelets by anticoagulation protocol: Yes   Plan:  Will give 5mg  of warfarin tonight  Erin Hearing PharmD., BCPS Clinical Pharmacist Pager [phone] 08/14/2015 11:01 AM

## 2015-08-15 ENCOUNTER — Inpatient Hospital Stay (HOSPITAL_COMMUNITY): Payer: PPO

## 2015-08-15 DIAGNOSIS — I482 Chronic atrial fibrillation: Secondary | ICD-10-CM

## 2015-08-15 LAB — URINE CULTURE

## 2015-08-15 LAB — CBC
HEMATOCRIT: 32.4 % — AB (ref 36.0–46.0)
HEMOGLOBIN: 10.6 g/dL — AB (ref 12.0–15.0)
MCH: 32.8 pg (ref 26.0–34.0)
MCHC: 32.7 g/dL (ref 30.0–36.0)
MCV: 100.3 fL — ABNORMAL HIGH (ref 78.0–100.0)
Platelets: 142 10*3/uL — ABNORMAL LOW (ref 150–400)
RBC: 3.23 MIL/uL — AB (ref 3.87–5.11)
RDW: 13.2 % (ref 11.5–15.5)
WBC: 10.7 10*3/uL — ABNORMAL HIGH (ref 4.0–10.5)

## 2015-08-15 LAB — PROTIME-INR
INR: 2.95 — ABNORMAL HIGH (ref 0.00–1.49)
PROTHROMBIN TIME: 30.2 s — AB (ref 11.6–15.2)

## 2015-08-15 LAB — BASIC METABOLIC PANEL
ANION GAP: 7 (ref 5–15)
BUN: 5 mg/dL — ABNORMAL LOW (ref 6–20)
CALCIUM: 7.7 mg/dL — AB (ref 8.9–10.3)
CO2: 22 mmol/L (ref 22–32)
Chloride: 104 mmol/L (ref 101–111)
Creatinine, Ser: 0.62 mg/dL (ref 0.44–1.00)
GFR calc non Af Amer: >60 mL/min (ref >60)
Glucose, Bld: 142 mg/dL — ABNORMAL HIGH (ref 65–99)
POTASSIUM: 3.5 mmol/L (ref 3.5–5.1)
Sodium: 133 mmol/L — ABNORMAL LOW (ref 135–145)

## 2015-08-15 LAB — LEGIONELLA ANTIGEN, URINE

## 2015-08-15 MED ORDER — DEXTROSE 5 % IV SOLN
1.0000 g | Freq: Three times a day (TID) | INTRAVENOUS | Status: DC
  Administered 2015-08-15 – 2015-08-17 (×6): 1 g via INTRAVENOUS
  Filled 2015-08-15 (×8): qty 1

## 2015-08-15 MED ORDER — METRONIDAZOLE IN NACL 5-0.79 MG/ML-% IV SOLN: 500.0000 mg | Freq: Three times a day (TID) | INTRAVENOUS | Status: DC

## 2015-08-15 MED ORDER — WARFARIN SODIUM 2 MG PO TABS
2.0000 mg | ORAL_TABLET | Freq: Once | ORAL | Status: AC
  Administered 2015-08-15: 2 mg via ORAL
  Filled 2015-08-15: qty 1

## 2015-08-15 MED ORDER — FUROSEMIDE 10 MG/ML IJ SOLN
40.0000 mg | Freq: Once | INTRAMUSCULAR | Status: AC
  Administered 2015-08-15: 40 mg via INTRAVENOUS
  Filled 2015-08-15: qty 4

## 2015-08-15 MED ORDER — CIPROFLOXACIN IN D5W 400 MG/200ML IV SOLN
400.0000 mg | Freq: Two times a day (BID) | INTRAVENOUS | Status: DC
  Administered 2015-08-15: 400 mg via INTRAVENOUS
  Filled 2015-08-15: qty 200

## 2015-08-15 MED ORDER — DEXTROSE 5 % IV SOLN
500.0000 mg | INTRAVENOUS | Status: DC
  Administered 2015-08-16 – 2015-08-17 (×2): 500 mg via INTRAVENOUS
  Filled 2015-08-15 (×2): qty 500

## 2015-08-15 MED ORDER — VANCOMYCIN HCL IN DEXTROSE 750-5 MG/150ML-% IV SOLN
750.0000 mg | INTRAVENOUS | Status: DC
  Administered 2015-08-15: 750 mg via INTRAVENOUS
  Filled 2015-08-15 (×3): qty 150

## 2015-08-15 NOTE — Progress Notes (Signed)
[facility] PROGRESS NOTE  [name] KVQ:[id number] DOB: [birth year] DOA: 08/14/2015 PCP: [name], MD  Assessment/Plan: 1. Sepsis with shock 1. Resolved 2. Unclear source. Suspecting pulmonary vs cellulitis 3. Pt is continued on empiric vanc and aztreonam 4. Pan cultures pending 5. Presenting leukocytosis of 13k, presented with fever of 101.86F rectally with tachycardia and lactic acidosis 6. Cont abx per above. Pt has shown improvement thus far 7. CT abd with dictated findings of "severe diverticulitis." However, discussed with the radiologist who states dictation was incorrect and should have read "severe diverticulosis" with "no evidence of diverticulitis" 2. ARF 1. Cr improved with IVF 2. Resolved. Cont monitor 3. Nausea/vomiting/abd pain 1. Pain mainly epigastric 2. Given septic picture, will obtain CT abd/pelvis w/o contrast given contrast allergy 3. abd US unremarkable 4. Chronic afib 1. Stable, currently rate controlled 2. On coumadin, dosing per pharmacy 5. Hx LV dysfunction CHF, NYHA class 2 1. Last documented EF of 60-65% in 2015 2. Followed by Dr. Ellyn Hack 3. Will resume lasix as pt was aggressively volume resuscitated  6. AAA 1. Stable 3.4cm infrarenal AAA on [place] 7. DVT prophylaxis 1. coumadin  Code Status: Full Family Communication: Pt in room Disposition Plan: Pending   Consultants:    Procedures:    Antibiotics:  Vancomycin 8/31>>>  Aztreonam 8/31>>>  HPI/Subjective: States feeling somewhat short winded with increased LE swelling.   Objective: Filed Vitals:   08/15/15 0419 08/15/15 0737 08/15/15 1000 08/15/15 1119  BP:  136/84 105/49 118/30  Pulse:  84 87 84  Temp:  98.6 F (37 C)  97.9 F (36.6 C)  TempSrc:  Oral  Oral  Resp:  23 27 16   Height:      Weight: 76.9 kg (169 lb 8.5 oz)     SpO2:  98% 99% 98%    Intake/Output Summary (Last 24 hours) at 08/15/15 1546 Last data filed at 08/15/15 1435  Gross per 24 hour   Intake   2300 ml  Output   4900 ml  Net  -2600 ml   Filed Weights   08/14/15 0039 08/14/15 0446 08/15/15 0419  Weight: 73.483 kg (162 lb) 77.2 kg (170 lb 3.1 oz) 76.9 kg (169 lb 8.5 oz)    Exam:   General:  Awake, in nad  Cardiovascular: regular, s1, s2  Respiratory: normal resp effort, no wheezing  Abdomen: soft, pos BS  Musculoskeletal: perfused, no clubbing, LLE edema seems more edematous  Data Reviewed: Basic Metabolic Panel:  Recent Labs Lab 08/14/15 0045 08/14/15 0525 08/15/15 0247  NA 133* 133* 133*  K 4.7 3.6 3.5  CL 95* 100* 104  CO2 26 25 22   GLUCOSE 132* 150* 142*  BUN 17 15 5*  CREATININE 1.14* 0.92 0.62  CALCIUM 9.2 7.9* 7.7*   Liver Function Tests:  Recent Labs Lab 08/14/15 0045 08/14/15 0525  AST 35 27  ALT 26 22  ALKPHOS 72 54  BILITOT 0.7 0.9  PROT 7.4 5.5*  ALBUMIN 4.1 3.0*    Recent Labs Lab 08/14/15 1104  LIPASE 25   No results for input(s): AMMONIA in the last 168 hours. CBC:  Recent Labs Lab 08/14/15 0045 08/14/15 0525 08/15/15 0247  WBC 14.4* 13.2* 10.7*  NEUTROABS 10.8* 11.0*  --   HGB 13.5 10.9* 10.6*  HCT 40.7 32.8* 32.4*  MCV 100.2* 100.0 100.3*  PLT 208 160 142*   Cardiac Enzymes:  Recent Labs Lab 08/14/15 0525 08/14/15 1104 08/14/15 1900  TROPONINI 0.20* 0.14* 0.08*   BNP (  last 3 results) No results for input(s): BNP in the last 8760 hours.  ProBNP (last 3 results) No results for input(s): PROBNP in the last 8760 hours.  CBG:  Recent Labs Lab 08/14/15 0314  GLUCAP 156*    Recent Results (from the past 240 hour(s))  Urine culture     Status: None   Collection Time: 08/14/15 12:57 AM  Result Value Ref Range Status   Specimen Description URINE, CLEAN CATCH  Final   Special Requests NONE  Final   Culture MULTIPLE SPECIES PRESENT, SUGGEST RECOLLECTION  Final   Report Status 08/15/2015 FINAL  Final  MRSA PCR Screening     Status: None   Collection Time: 08/14/15  4:30 AM  Result Value  Ref Range Status   MRSA by PCR NEGATIVE NEGATIVE Final    Comment:        The GeneXpert MRSA Assay (FDA approved for NASAL specimens only), is one component of a comprehensive MRSA colonization surveillance program. It is not intended to diagnose MRSA infection nor to guide or monitor treatment for MRSA infections.      Studies: Ct Abdomen Pelvis Wo Contrast  08/15/2015   CLINICAL DATA:  Bilateral lower abdominal pain, vomiting and constipation. History of diverticulitis.  EXAM: CT ABDOMEN AND PELVIS WITHOUT CONTRAST  TECHNIQUE: Multidetector CT imaging of the abdomen and pelvis was performed following the standard protocol without IV contrast.  COMPARISON:  Abdominal ultrasound dated 08/14/2015, CT of the abdomen pelvis dated 08/22/2014.  FINDINGS: Lower chest: Bilateral small pleural effusions with atelectasis of the right lower lobe. Atherosclerotic coronary artery disease and valvular calcifications are noted.  Hepatobiliary: No masses or other significant abnormality identified. The gallbladder appears normal.  Pancreas: No evidence of mass, inflammatory changes, or other significant abnormality.  Spleen:  Within normal limits in size and appearance.  Adrenal Glands:  No masses identified.  Kidneys/Urinary Tract: No evidence of urolithiasis or hydronephrosis. There is a 6.7 cm mostly exophytic cyst off of the lower pole of the left kidney. There is bilateral perirenal stranding. No masses or calculi seen involving the lower urinary tract.  Stomach/Bowel/Peritoneum: There is thickening of the posterior gastric wall of the gastric body. There is a 2 cm outpouching of the proximal duodenum, which may represent a diverticulum. There is no evidence of small bowel obstruction. The colon is redundant but normal in caliber. Numerous diverticula seen throughout the colon, severely affecting the entire length of the colon pass the hepatic flexure. However there is no evidence of her pericolonic  inflammation wall abscess formation.  Vascular/Lymphatic: No pathologically enlarged lymph nodes identified. No other significant abnormality noted.  Reproductive: There has been a prior hysterectomy. No adnexal masses are seen.  Other: The aorta is affected by heavy atherosclerotic calcifications. There is fusiform aneurysmal dilation of the infrarenal aorta with maximum diameter of 2.6 cm  Musculoskeletal: Osteoarthritic changes and a reversed S shaped scoliosis of the lumbosacral spine are noted. Osteoarthritic changes of the right hip are also seen. No evidence of suspicious osseous lesions.  IMPRESSION: Severe diverticulitis affecting the entire length of the colon pass the hepatic flexure, however no evidence of diverticulosis on this noncontrasted study.  Probable diverticulum off of the second portion of the duodenum.  Large but simple in appearance left renal cyst.  Thickening of the posterior aspect of the gastric body, incompletely visualized due to lack of IV contrast. Direct visualization may be considered if this is of clinical concern.  Atherosclerotic disease of the aorta with  stable infrarenal fusiform abdominal aortic aneurysm measuring 2.6 cm in maximum cross-section diameter.  Small bilateral pleural effusions with right lower lobe atelectasis.  Atherosclerotic disease of the coronary arteries.   Electronically Signed   By: Fidela Salisbury M.D.   On: 08/15/2015 07:59   US Abdomen Complete  08/14/2015   CLINICAL DATA:  81 year old female with nausea and abdominal aortic aneurysm.  EXAM: ULTRASOUND ABDOMEN COMPLETE  COMPARISON:  05/20/2015 abdominal sonogram. 08/22/2014 CT abdomen/pelvis.  FINDINGS: Gallbladder: No gallstones or sludge are detected in the gallbladder (the previously described 6 mm gallstone on the 05/20/2015 sonogram is not visualized on today's scan). The gallbladder wall is top-normal in thickness. There is no significant gallbladder distention. No pericholecystic fluid or  sonographic Murphy sign.  Common bile duct: Diameter: 4 mm  Liver: Liver appears normal in size and configuration. Liver parenchymal echogenicity and echotexture are within normal limits. No liver mass is detected. The main portal vein is patent with appropriate flow direction.  IVC: No abnormality visualized.  Pancreas: Visualized portion unremarkable.  Spleen: Size and appearance within normal limits.  Right Kidney: Length: 11.6 cm. Echogenicity within normal limits. No mass or hydronephrosis visualized.  Left Kidney: Length: 11.1 cm. Echogenicity within normal limits. No hydronephrosis. Exophytic simple 6.6 x 6.1 x 6.3 cm renal cyst in the interpolar left kidney, previously 6.2 x 6.1 x 6.1 cm, minimally increased. No new left renal lesions.  Abdominal aorta: There is a stable infrarenal abdominal aortic aneurysm measuring 3.4 cm in maximum AP diameter, not appreciably changed compared to the 08/22/2014 CT study.  Other findings: None.  IMPRESSION: 1. No cholelithiasis or gallbladder sludge detected on today's scan. No evidence of acute cholecystitis. No biliary ductal dilatation. 2. Stable 3.4 cm infrarenal abdominal aortic aneurysm. 3. Stable large simple left renal cyst. Otherwise normal abdominal sonogram.   Electronically Signed   By: Ilona Sorrel M.D.   On: 08/14/2015 11:30   Dg Chest Port 1 View  08/15/2015   CLINICAL DATA:  CHF  EXAM: PORTABLE CHEST - 1 VIEW  COMPARISON:  the previous day's study  FINDINGS: Stable mild cardiomegaly. Improved bibasilar aeration with some residual bibasilar subsegmental atelectasis or infiltrate. Atheromatous aorta. Possible small left pleural effusion.  No pneumothorax. Visualized skeletal structures are unremarkable.  IMPRESSION: 1. Improved aeration with mild residual bibasilar opacities.   Electronically Signed   By: Lucrezia Europe M.D.   On: 08/15/2015 09:34   Dg Chest Portable 1 View  08/14/2015   CLINICAL DATA:  Dyspnea and cough.  Fever.  EXAM: PORTABLE CHEST - 1  VIEW  COMPARISON:  11/25/2013  FINDINGS: There are central ground-glass opacities and patchy basilar opacities which are new from 11/25/2013. There is mild cardiomegaly. There is mild vascular and interstitial prominence. The findings may represent congestive heart failure.  IMPRESSION: Mild central ground-glass opacities and patchy central basilar opacity, as well as vascular and interstitial prominence. This may represent congestive heart failure although infectious infiltrates cannot be entirely excluded.   Electronically Signed   By: Andreas Newport M.D.   On: 08/14/2015 01:49    Scheduled Meds: . azithromycin  500 mg Intravenous Q24H  . aztreonam  1 g Intravenous 3 times per day  . calcium carbonate  1 tablet Oral Daily  . cholecalciferol  1,000 Units Oral Daily  . diltiazem  240 mg Oral Daily  . metoprolol succinate  50 mg Oral QHS  . omega-3 acid ethyl esters  1,000 mg Oral Daily  . pantoprazole  40 mg Oral Daily  . rosuvastatin  5 mg Oral QPM  . vancomycin  750 mg Intravenous Q24H  . warfarin  2 mg Oral ONCE-1800  . Warfarin - Pharmacist Dosing Inpatient   Does not apply q1800   Continuous Infusions:    Principal Problem:   Sepsis Active Problems:   Chronic atrial fibrillation   Hypokalemia   Left ventricular diastolic dysfunction, NYHA class 2   AAA (abdominal aortic aneurysm)   Cellulitis of left lower extremity   ARF (acute renal failure)   Aortic aneurysm   Pyrexia    CHIU, STEPHEN K  [facility] Pager [phone]. If 7PM-7AM, please contact night-coverage at [email or web address], password [email or web address] 08/15/2015, 3:46 PM  LOS: 1 day

## 2015-08-15 NOTE — Progress Notes (Signed)
[place] for Warfarin  Indication: atrial fibrillation, hx VTE  Labs:  Recent Labs  08/14/15 0045 08/14/15 0525 08/14/15 1104 08/14/15 1900 08/15/15 0247  HGB 13.5 10.9*  --   --  10.6*  HCT 40.7 32.8*  --   --  32.4*  PLT 208 160  --   --  142*  LABPROT  --  28.0*  --   --  30.2*  INR  --  2.66*  --   --  2.95*  CREATININE 1.14* 0.92  --   --  0.62  TROPONINI  --  0.20* 0.14* 0.08*  --     Estimated Creatinine Clearance: 47 mL/min (by C-G formula based on Cr of 0.62).  Assessment: Warfarin PTA for chronic afib and hx VTE.   INR within therapeutic range this am at 2.95 but now at upper end on continued home dosing. No bleeding has been noted however hgb dropped on admission now stable at 10.9.   PTA warfarin dose is 5mg  daily except 7.5mg  on MWF  Patient being started on cipro and flagyl per conversation with MD for severe diverticulitis. Will need to be very cautious with warfarin dosing.  Diverticulitis:tmax 100.3, wbc 10(down), abx changed to cipro/flagyl  Goal of Therapy:  INR 2-3 Monitor platelets by anticoagulation protocol: Yes   Plan:  Will give 2mg  of warfarin tonight Daily INR Cipro 400mg  IV q12 hours Flagyl 500mg  q8 hours Change to po when able  Erin Hearing PharmD., BCPS Clinical Pharmacist Pager [phone] 08/15/2015 8:46 AM

## 2015-08-16 DIAGNOSIS — N179 Acute kidney failure, unspecified: Secondary | ICD-10-CM

## 2015-08-16 LAB — BASIC METABOLIC PANEL
Anion gap: 7 (ref 5–15)
BUN: 7 mg/dL (ref 6–20)
CALCIUM: 8.6 mg/dL — AB (ref 8.9–10.3)
CO2: 25 mmol/L (ref 22–32)
CREATININE: 0.63 mg/dL (ref 0.44–1.00)
Chloride: 102 mmol/L (ref 101–111)
GFR calc Af Amer: >60 mL/min (ref >60)
GLUCOSE: 122 mg/dL — AB (ref 65–99)
POTASSIUM: 3.5 mmol/L (ref 3.5–5.1)
SODIUM: 134 mmol/L — AB (ref 135–145)

## 2015-08-16 LAB — CBC
HEMATOCRIT: 32.6 % — AB (ref 36.0–46.0)
Hemoglobin: 11 g/dL — ABNORMAL LOW (ref 12.0–15.0)
MCH: 33.4 pg (ref 26.0–34.0)
MCHC: 33.7 g/dL (ref 30.0–36.0)
MCV: 99.1 fL (ref 78.0–100.0)
PLATELETS: 150 10*3/uL (ref 150–400)
RBC: 3.29 MIL/uL — ABNORMAL LOW (ref 3.87–5.11)
RDW: 13 % (ref 11.5–15.5)
WBC: 8 10*3/uL (ref 4.0–10.5)

## 2015-08-16 LAB — PROTIME-INR
INR: 2.57 — ABNORMAL HIGH (ref 0.00–1.49)
Prothrombin Time: 27.3 seconds — ABNORMAL HIGH (ref 11.6–15.2)

## 2015-08-16 MED ORDER — POLYETHYLENE GLYCOL 3350 17 G PO PACK: 17.0000 g | PACK | Freq: Every day | ORAL | Status: DC | PRN

## 2015-08-16 MED ORDER — WARFARIN SODIUM 5 MG PO TABS
5.0000 mg | ORAL_TABLET | Freq: Once | ORAL | Status: AC
Start: 2015-08-16 — End: 2015-08-16
  Administered 2015-08-16: 5 mg via ORAL
  Filled 2015-08-16: qty 1

## 2015-08-16 MED ORDER — VANCOMYCIN HCL IN DEXTROSE 750-5 MG/150ML-% IV SOLN
750.0000 mg | Freq: Two times a day (BID) | INTRAVENOUS | Status: DC
  Administered 2015-08-16 – 2015-08-17 (×3): 750 mg via INTRAVENOUS
  Filled 2015-08-16 (×4): qty 150

## 2015-08-16 NOTE — Progress Notes (Signed)
ANTICOAGULATION CONSULT NOTE - Initial Consult  Pharmacy Consult for Warfarin  Indication: atrial fibrillation, hx VTE  Allergies  Allergen Reactions  . Contrast Media [Iodinated Diagnostic Agents] Hives  . Levaquin [Levofloxacin] Itching and Rash  . Penicillins Rash    "haven't had it in years"  . Vancomycin Itching and Rash    Patient Measurements: Height: 5\' 6"  (167.6 cm) Weight: 175 lb 8 oz (79.606 kg) IBW/kg (Calculated) : 59.3  Vital Signs: Temp: 97.6 F (36.4 C) (09/03 0732) Temp Source: Oral (09/03 0732) BP: 138/58 mmHg (09/03 0732) Pulse Rate: 82 (09/03 0732)  Labs:  Recent Labs  08/14/15 0525 08/14/15 1104 08/14/15 1900 08/15/15 0247 08/16/15 0234  HGB 10.9*  --   --  10.6* 11.0*  HCT 32.8*  --   --  32.4* 32.6*  PLT 160  --   --  142* 150  LABPROT 28.0*  --   --  30.2* 27.3*  INR 2.66*  --   --  2.95* 2.57*  CREATININE 0.92  --   --  0.62 0.63  TROPONINI 0.20* 0.14* 0.08*  --   --     Estimated Creatinine Clearance: 48.7 mL/min (by C-G formula based on Cr of 0.63).   Medical History: Past Medical History  Diagnosis Date  . Coronary artery disease     Nonobstructive  . GERD (gastroesophageal reflux disease)   . Headache(784.0)   . High cholesterol   . DVT (deep venous thrombosis), right 2005    "dry blood; after 8 foot fall"  . Chronic bronchitis     "get it basically 3 times/yr"  . Blood transfusion 1960  . H/O hiatal hernia   . Rheumatoid arthritis(714.0)   . Concussion w/o coma 1/57/2620    Complicated by subarachnoid hemorrhage.  "even now has times when she's not able to comprehend" (05/08/12)  . Chronic atrial fibrillation     Anticoagulated with warfarin, rate control with diltiazem and Toprol  . Pneumonia ~ 2010 AND 2013  . 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 Diverticulitis   . Diverticulosis   . External hemorrhoids   . Fatty liver   . AAA (abdominal aortic aneurysm)   .  Edema of both legs     Chronic, thought to be secondary to DVTs  . Hx of bipolar disorder     Assessment: Warfarin PTA for chronic afib and hx VTE. INR today remains therapeutic at 2.57 with trend down after lower dose given yesterday. Hgb remains low but stable with no reported obvious s/s bleeding. Will dose cautiously with addition of azithromycin which may potentiate the effects of warfarin.  Home dose: 5 mg daily except 7.5 mg MWF  Goal of Therapy:  INR 2-3 Monitor platelets by anticoagulation protocol: Yes   Plan:  - Warfarin 5 mg PO x 1 tonight - Daily PT/INR - Monitor CBC and for s/s bleeding   K. Velva Harman, PharmD, BCPS, CPP Clinical Pharmacist Pager: [phone] Phone: [phone] 08/16/2015 10:26 AM

## 2015-08-16 NOTE — Progress Notes (Signed)
[facility] PROGRESS NOTE  LYNNOX GIRTEN BJS:[id number] DOB: 1921-01-17 DOA: 08/14/2015 PCP: [name], MD  Assessment/Plan: 1. Sepsis with shock with possible cellulitis and possible CAP 1. Sepsis resolved 2. Pt is continued on empiric vanc and aztreonam 3. Pan cultures pending 4. Presenting leukocytosis of 13k, presented with fever of 101.40F rectally with tachycardia and lactic acidosis 5. Cont abx per above. Pt has shown improvement thus far 6. WBC has normalized. Pt remains afebrile. Lactate normalized 7. CT abd with dictated findings of "severe diverticulitis." However, discussed with the radiologist who states dictation was incorrect and should have read "severe diverticulosis" with "no evidence of diverticulitis" 2. ARF 1. Resolved. Cont monitor 2. Resumed lasix 3. Nausea/vomiting/abd pain 1. Pain mainly epigastric 2. abd US unremarkable 3. CT abd with severe diverticulosis without diverticulitis 4. Chronic afib 1. Stable, currently rate controlled 2. On coumadin, dosing per pharmacy 5. Hx LV dysfunction CHF, NYHA class 2 1. Last documented EF of 60-65% in 2015 2. Followed by Dr. Ellyn Hack 3. Will resume lasix as pt was aggressively volume resuscitated  6. AAA 1. Stable 3.4cm infrarenal AAA on [place] 7. DVT prophylaxis 1. coumadin  Code Status: Full Family Communication: Pt in room Disposition Plan: Pending   Consultants:    Procedures:    Antibiotics:  Vancomycin 8/31>>>  Aztreonam 8/31>>>  HPI/Subjective: Patient reports feeling better, still has LLE pain, redness, and swelling.  Objective: Filed Vitals:   08/16/15 0400 08/16/15 0732 08/16/15 1152 08/16/15 1536  BP: 122/38 138/58 115/45 139/47  Pulse: 48 82 64 62  Temp: 97.8 F (36.6 C) 97.6 F (36.4 C) 97.6 F (36.4 C) 98.3 F (36.8 C)  TempSrc: Oral Oral Oral Oral  Resp: 20 24 18 21   Height: 5\' 6"  (1.676 m)     Weight: 79.606 kg (175 lb 8 oz)     SpO2: 93% 97% 94% 92%     Intake/Output Summary (Last 24 hours) at 08/16/15 1553 Last data filed at 08/16/15 1336  Gross per 24 hour  Intake   1410 ml  Output   2100 ml  Net   -690 ml   Filed Weights   08/14/15 0446 08/15/15 0419 08/16/15 0400  Weight: 77.2 kg (170 lb 3.1 oz) 76.9 kg (169 lb 8.5 oz) 79.606 kg (175 lb 8 oz)    Exam:   General:  Awake, in nad  Cardiovascular: regular, s1, s2  Respiratory: normal resp effort, no wheezing  Abdomen: soft, pos BS  Musculoskeletal: perfused, no clubbing, LLE edema, erythema, tenderness on palpation  Data Reviewed: Basic Metabolic Panel:  Recent Labs Lab 08/14/15 0045 08/14/15 0525 08/15/15 0247 08/16/15 0234  NA 133* 133* 133* 134*  K 4.7 3.6 3.5 3.5  CL 95* 100* 104 102  CO2 26 25 22 25   GLUCOSE 132* 150* 142* 122*  BUN 17 15 5* 7  CREATININE 1.14* 0.92 0.62 0.63  CALCIUM 9.2 7.9* 7.7* 8.6*   Liver Function Tests:  Recent Labs Lab 08/14/15 0045 08/14/15 0525  AST 35 27  ALT 26 22  ALKPHOS 72 54  BILITOT 0.7 0.9  PROT 7.4 5.5*  ALBUMIN 4.1 3.0*    Recent Labs Lab 08/14/15 1104  LIPASE 25   No results for input(s): AMMONIA in the last 168 hours. CBC:  Recent Labs Lab 08/14/15 0045 08/14/15 0525 08/15/15 0247 08/16/15 0234  WBC 14.4* 13.2* 10.7* 8.0  NEUTROABS 10.8* 11.0*  --   --   HGB 13.5 10.9* 10.6* 11.0*  HCT 40.7 32.8*  32.4* 32.6*  MCV 100.2* 100.0 100.3* 99.1  PLT 208 160 142* 150   Cardiac Enzymes:  Recent Labs Lab 08/14/15 0525 08/14/15 1104 08/14/15 1900  TROPONINI 0.20* 0.14* 0.08*   BNP (last 3 results) No results for input(s): BNP in the last 8760 hours.  ProBNP (last 3 results) No results for input(s): PROBNP in the last 8760 hours.  CBG:  Recent Labs Lab 08/14/15 0314  GLUCAP 156*    Recent Results (from the past 240 hour(s))  Urine culture     Status: None   Collection Time: 08/14/15 12:57 AM  Result Value Ref Range Status   Specimen Description URINE, CLEAN CATCH  Final    Special Requests NONE  Final   Culture MULTIPLE SPECIES PRESENT, SUGGEST RECOLLECTION  Final   Report Status 08/15/2015 FINAL  Final  Culture, blood (routine x 2)     Status: None (Preliminary result)   Collection Time: 08/14/15  1:20 AM  Result Value Ref Range Status   Specimen Description BLOOD RIGHT ARM  Final   Special Requests BOTTLES DRAWN AEROBIC AND ANAEROBIC 10CC  Final   Culture NO GROWTH 2 DAYS  Final   Report Status PENDING  Incomplete  Culture, blood (routine x 2)     Status: None (Preliminary result)   Collection Time: 08/14/15  1:46 AM  Result Value Ref Range Status   Specimen Description BLOOD RIGHT HAND  Final   Special Requests BOTTLES DRAWN AEROBIC ONLY 3CC  Final   Culture NO GROWTH 2 DAYS  Final   Report Status PENDING  Incomplete  MRSA PCR Screening     Status: None   Collection Time: 08/14/15  4:30 AM  Result Value Ref Range Status   MRSA by PCR NEGATIVE NEGATIVE Final    Comment:        The GeneXpert MRSA Assay (FDA approved for NASAL specimens only), is one component of a comprehensive MRSA colonization surveillance program. It is not intended to diagnose MRSA infection nor to guide or monitor treatment for MRSA infections.      Studies: Ct Abdomen Pelvis Wo Contrast  08/16/2015   ADDENDUM REPORT: 08/16/2015 09:05  ADDENDUM: Corrected report:  IMPRESSION: Severe diverticulosis affecting the entire length of the colon past the hepatic flexure, however no evidence of diverticulitis on this noncontrasted study.  Findings discussed with Dr. Wyline Copas.   Electronically Signed   By: Nolon Nations M.D.   On: 08/16/2015 09:05   08/16/2015   CLINICAL DATA:  Bilateral lower abdominal pain, vomiting and constipation. History of diverticulitis.  EXAM: CT ABDOMEN AND PELVIS WITHOUT CONTRAST  TECHNIQUE: Multidetector CT imaging of the abdomen and pelvis was performed following the standard protocol without IV contrast.  COMPARISON:  Abdominal ultrasound dated 08/14/2015,  CT of the abdomen pelvis dated 08/22/2014.  FINDINGS: Lower chest: Bilateral small pleural effusions with atelectasis of the right lower lobe. Atherosclerotic coronary artery disease and valvular calcifications are noted.  Hepatobiliary: No masses or other significant abnormality identified. The gallbladder appears normal.  Pancreas: No evidence of mass, inflammatory changes, or other significant abnormality.  Spleen:  Within normal limits in size and appearance.  Adrenal Glands:  No masses identified.  Kidneys/Urinary Tract: No evidence of urolithiasis or hydronephrosis. There is a 6.7 cm mostly exophytic cyst off of the lower pole of the left kidney. There is bilateral perirenal stranding. No masses or calculi seen involving the lower urinary tract.  Stomach/Bowel/Peritoneum: There is thickening of the posterior gastric wall of the gastric  body. There is a 2 cm outpouching of the proximal duodenum, which may represent a diverticulum. There is no evidence of small bowel obstruction. The colon is redundant but normal in caliber. Numerous diverticula seen throughout the colon, severely affecting the entire length of the colon pass the hepatic flexure. However there is no evidence of her pericolonic inflammation wall abscess formation.  Vascular/Lymphatic: No pathologically enlarged lymph nodes identified. No other significant abnormality noted.  Reproductive: There has been a prior hysterectomy. No adnexal masses are seen.  Other: The aorta is affected by heavy atherosclerotic calcifications. There is fusiform aneurysmal dilation of the infrarenal aorta with maximum diameter of 2.6 cm  Musculoskeletal: Osteoarthritic changes and a reversed S shaped scoliosis of the lumbosacral spine are noted. Osteoarthritic changes of the right hip are also seen. No evidence of suspicious osseous lesions.  IMPRESSION: Severe diverticulitis affecting the entire length of the colon pass the hepatic flexure, however no evidence of  diverticulosis on this noncontrasted study.  Probable diverticulum off of the second portion of the duodenum.  Large but simple in appearance left renal cyst.  Thickening of the posterior aspect of the gastric body, incompletely visualized due to lack of IV contrast. Direct visualization may be considered if this is of clinical concern.  Atherosclerotic disease of the aorta with stable infrarenal fusiform abdominal aortic aneurysm measuring 2.6 cm in maximum cross-section diameter.  Small bilateral pleural effusions with right lower lobe atelectasis.  Atherosclerotic disease of the coronary arteries.  Electronically Signed: By: Fidela Salisbury M.D. On: 08/15/2015 07:59   Dg Chest Port 1 View  08/15/2015   CLINICAL DATA:  CHF  EXAM: PORTABLE CHEST - 1 VIEW  COMPARISON:  the previous day's study  FINDINGS: Stable mild cardiomegaly. Improved bibasilar aeration with some residual bibasilar subsegmental atelectasis or infiltrate. Atheromatous aorta. Possible small left pleural effusion.  No pneumothorax. Visualized skeletal structures are unremarkable.  IMPRESSION: 1. Improved aeration with mild residual bibasilar opacities.   Electronically Signed   By: Lucrezia Europe M.D.   On: 08/15/2015 09:34    Scheduled Meds: . azithromycin  500 mg Intravenous Q24H  . aztreonam  1 g Intravenous 3 times per day  . calcium carbonate  1 tablet Oral Daily  . cholecalciferol  1,000 Units Oral Daily  . diltiazem  240 mg Oral Daily  . metoprolol succinate  50 mg Oral QHS  . omega-3 acid ethyl esters  1,000 mg Oral Daily  . pantoprazole  40 mg Oral Daily  . rosuvastatin  5 mg Oral QPM  . vancomycin  750 mg Intravenous Q12H  . warfarin  5 mg Oral ONCE-1800  . Warfarin - Pharmacist Dosing Inpatient   Does not apply q1800   Continuous Infusions:    Principal Problem:   Sepsis Active Problems:   Chronic atrial fibrillation   Hypokalemia   Left ventricular diastolic dysfunction, NYHA class 2   AAA (abdominal aortic  aneurysm)   Cellulitis of left lower extremity   ARF (acute renal failure)   Aortic aneurysm   Pyrexia    ,  K  [facility] Pager [phone]. If 7PM-7AM, please contact night-coverage at [email or web address], password [facility] 08/16/2015, 3:53 PM  LOS: 2 days

## 2015-08-17 ENCOUNTER — Encounter (HOSPITAL_COMMUNITY): Payer: PPO

## 2015-08-17 DIAGNOSIS — E876 Hypokalemia: Secondary | ICD-10-CM

## 2015-08-17 LAB — BASIC METABOLIC PANEL
ANION GAP: 6 (ref 5–15)
BUN: 6 mg/dL (ref 6–20)
CHLORIDE: 101 mmol/L (ref 101–111)
CO2: 27 mmol/L (ref 22–32)
Calcium: 8.5 mg/dL — ABNORMAL LOW (ref 8.9–10.3)
Creatinine, Ser: 0.62 mg/dL (ref 0.44–1.00)
GFR calc Af Amer: >60 mL/min (ref >60)
GLUCOSE: 147 mg/dL — AB (ref 65–99)
POTASSIUM: 3.6 mmol/L (ref 3.5–5.1)
Sodium: 134 mmol/L — ABNORMAL LOW (ref 135–145)

## 2015-08-17 LAB — PROTIME-INR
INR: 2.16 — AB (ref 0.00–1.49)
Prothrombin Time: 23.9 seconds — ABNORMAL HIGH (ref 11.6–15.2)

## 2015-08-17 MED ORDER — FUROSEMIDE 40 MG PO TABS: 40.0000 mg | ORAL_TABLET | Freq: Every day | ORAL | Status: DC

## 2015-08-17 MED ORDER — SACCHAROMYCES BOULARDII 250 MG PO CAPS
250.0000 mg | ORAL_CAPSULE | Freq: Two times a day (BID) | ORAL | Status: DC
  Administered 2015-08-17 – 2015-08-18 (×2): 250 mg via ORAL
  Filled 2015-08-17 (×2): qty 1

## 2015-08-17 MED ORDER — FUROSEMIDE 40 MG PO TABS: 40.0000 mg | ORAL_TABLET | ORAL | Status: DC

## 2015-08-17 MED ORDER — WHITE PETROLATUM GEL
Status: AC
  Administered 2015-08-17: 20:00:00
  Filled 2015-08-17: qty 1

## 2015-08-17 MED ORDER — FUROSEMIDE 80 MG PO TABS
80.0000 mg | ORAL_TABLET | ORAL | Status: DC
  Administered 2015-08-18: 80 mg via ORAL
  Filled 2015-08-17: qty 1

## 2015-08-17 MED ORDER — DOXYCYCLINE HYCLATE 100 MG PO TABS
100.0000 mg | ORAL_TABLET | Freq: Two times a day (BID) | ORAL | Status: DC
  Administered 2015-08-17 – 2015-08-18 (×2): 100 mg via ORAL
  Filled 2015-08-17 (×2): qty 1

## 2015-08-17 MED ORDER — FUROSEMIDE 40 MG PO TABS
40.0000 mg | ORAL_TABLET | ORAL | Status: DC
  Administered 2015-08-17: 40 mg via ORAL
  Filled 2015-08-17: qty 1

## 2015-08-17 MED ORDER — AZITHROMYCIN 500 MG PO TABS
500.0000 mg | ORAL_TABLET | Freq: Every day | ORAL | Status: DC
Start: 2015-08-18 — End: 2015-08-17

## 2015-08-17 MED ORDER — WARFARIN SODIUM 7.5 MG PO TABS
7.5000 mg | ORAL_TABLET | Freq: Once | ORAL | Status: AC
  Administered 2015-08-17: 7.5 mg via ORAL
  Filled 2015-08-17: qty 1

## 2015-08-17 NOTE — Evaluation (Signed)
Physical Therapy Evaluation Patient Details Name: [name] MRN: [id number] DOB: 02/09/1921 Today's Date: 08/17/2015   History of Present Illness  Admitted with Sepsis - possible sources left lower extremities cellulitis and also possible pneumonia; ARF, resolving;  has a past medical history of Coronary artery disease; GERD (gastroesophageal reflux disease); Headache(784.0); High cholesterol; DVT (deep venous thrombosis), right (2005); Chronic bronchitis; Blood transfusion (1960); H/O hiatal hernia; Rheumatoid arthritis(714.0); Concussion w/o coma (03/31/2004); Chronic atrial fibrillation; Pneumonia (~ 2010 AND 2013); Bladder cancer (dx'd 2011); Diverticulitis; Diverticulosis; External hemorrhoids; Fatty liver; AAA (abdominal aortic aneurysm); Edema of both legs; and bipolar disorder.  Clinical Impression  Pt admitted with above diagnosis. Pt currently with functional limitations due to the deficits listed below (see PT Problem List).  Pt will benefit from skilled PT to increase their independence and safety with mobility to allow discharge to the venue listed below.       Follow Up Recommendations Home health PT    Equipment Recommendations  3in1 (PT)    Recommendations for Other Services OT consult     Precautions / Restrictions Precautions Precautions: Fall Precaution Comments: Fall risk greatly reduced with use of rW      Mobility  Bed Mobility Overal bed mobility: Needs Assistance Bed Mobility: Supine to Sit     Supine to sit: Supervision     General bed mobility comments: Cues for technqiue and to square off hips at EOB  Transfers Overall transfer level: Needs assistance Equipment used: Rolling walker (2 wheeled) Transfers: Sit to/from Stand Sit to Stand: Min guard         General transfer comment: Cues for safedty and hand placement  Ambulation/Gait Ambulation/Gait assistance: Min guard Ambulation Distance (Feet): 150 Feet Assistive device: Rolling  walker (2 wheeled) Gait Pattern/deviations: Step-through pattern;Decreased stride length     General Gait Details: Cues to self-monitor for activity tolerance and to push down into RW to [employer] painful LLE  Stairs            Wheelchair Mobility    Modified Rankin (Stroke Patients Only)       Balance                                             Pertinent Vitals/Pain Pain Assessment: Faces Faces Pain Scale: Hurts little more Pain Location: LLE with movement, and when in dependent position Pain Descriptors / Indicators: Aching Pain Intervention(s): Repositioned    Home Living Family/patient expects to be discharged to:: Private residence Living Arrangements: Children Available Help at Discharge: Family;Available 24 hours/day Type of Home: House Home Access: Ramped entrance     Home Layout: One level Home Equipment: Clinical cytogeneticist - 2 wheels;Cane - single point      Prior Function Level of Independence: Independent with assistive device(s)         Comments: cane for amb     Hand Dominance        Extremity/Trunk Assessment   Upper Extremity Assessment: Overall WFL for tasks assessed           Lower Extremity Assessment: Generalized weakness (LLE erythematous)      Cervical / Trunk Assessment: Normal  Communication   Communication: No difficulties  Cognition Arousal/Alertness: Awake/alert Behavior During Therapy: WFL for tasks assessed/performed Overall Cognitive Status: Within Functional Limits for tasks assessed  General Comments      Exercises        Assessment/Plan    PT Assessment Patient needs continued PT services  PT Diagnosis Difficulty walking;Acute pain   PT Problem List Decreased strength;Decreased range of motion;Decreased activity tolerance;Decreased balance;Decreased mobility;Decreased knowledge of use of DME;Decreased knowledge of precautions;Pain  PT Treatment  Interventions DME instruction;Gait training;Functional mobility training;Therapeutic activities;Therapeutic exercise;Balance training;Patient/family education   PT Goals (Current goals can be found in the Care Plan section) Acute Rehab PT Goals Patient Stated Goal: wants to get well PT Goal Formulation: With patient Time For Goal Achievement: 08/31/15 Potential to Achieve Goals: Good    Frequency Min 3X/week   Barriers to discharge        Co-evaluation               End of Session Equipment Utilized During Treatment: Gait belt Activity Tolerance: Patient tolerated treatment well Patient left: in chair;with call bell/phone within reach;with family/visitor present Nurse Communication: Mobility status         Time: 5035-4656 PT Time Calculation (min) (ACUTE ONLY): 24 min   Charges:   PT Evaluation $Initial PT Evaluation Tier I: 1 Procedure PT Treatments $Gait Training: 8-22 mins   PT G CodesQuin Hoop 08/17/2015, 2:57 PM  Roney Marion, [place] Pager [phone] Office [phone]

## 2015-08-17 NOTE — Progress Notes (Signed)
ANTICOAGULATION CONSULT NOTE - Initial Consult  Pharmacy Consult for Warfarin  Indication: atrial fibrillation, hx VTE  Allergies  Allergen Reactions  . Contrast Media [Iodinated Diagnostic Agents] Hives  . Levaquin [Levofloxacin] Itching and Rash  . Penicillins Rash    "haven't had it in years"  . Vancomycin Itching and Rash    Patient Measurements: Height: 5\' 3"  (160 cm) Weight: 184 lb 11.9 oz (83.8 kg) IBW/kg (Calculated) : 52.4  Vital Signs: Temp: 98.3 F (36.8 C) (09/04 0340) Temp Source: Oral (09/04 0340) BP: 140/60 mmHg (09/04 0758) Pulse Rate: 69 (09/04 0340)  Labs:  Recent Labs  08/14/15 1104 08/14/15 1900 08/15/15 0247 08/16/15 0234 08/17/15 0330  HGB  --   --  10.6* 11.0*  --   HCT  --   --  32.4* 32.6*  --   PLT  --   --  142* 150  --   LABPROT  --   --  30.2* 27.3* 23.9*  INR  --   --  2.95* 2.57* 2.16*  CREATININE  --   --  0.62 0.63 0.62  TROPONINI 0.14* 0.08*  --   --   --     Estimated Creatinine Clearance: 47 mL/min (by C-G formula based on Cr of 0.62).   Medical History: Past Medical History  Diagnosis Date  . Coronary artery disease     Nonobstructive  . GERD (gastroesophageal reflux disease)   . Headache(784.0)   . High cholesterol   . DVT (deep venous thrombosis), right 2005    "dry blood; after 8 foot fall"  . Chronic bronchitis     "get it basically 3 times/yr"  . Blood transfusion 1960  . H/O hiatal hernia   . Rheumatoid arthritis(714.0)   . Concussion w/o coma 03/14/271    Complicated by subarachnoid hemorrhage.  "even now has times when she's not able to comprehend" (05/08/12)  . Chronic atrial fibrillation     Anticoagulated with warfarin, rate control with diltiazem and Toprol  . Pneumonia ~ 2010 AND 2013  . 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 Diverticulitis   . Diverticulosis   . External hemorrhoids   . Fatty liver   . AAA (abdominal aortic aneurysm)   . Edema of  both legs     Chronic, thought to be secondary to DVTs  . Hx of bipolar disorder     Assessment: Warfarin PTA for chronic afib and hx VTE. INR today remains therapeutic at 2.16 with trend down after lower dose given on 9/2. Hgb remains low but stable with no reported obvious s/s bleeding. Of note, patient continues on azithromycin which may potentiate the effects of warfarin.  Home dose: 5 mg daily except 7.5 mg MWF  Goal of Therapy:  INR 2-3 Monitor platelets by anticoagulation protocol: Yes   Plan:  - Warfarin 7.5 mg PO x 1 tonight - Daily PT/INR - Monitor CBC and for s/s bleeding  Ted Leonhart K. Velva Harman, PharmD, BCPS, CPP Clinical Pharmacist Pager: [phone] Phone: [phone] 08/17/2015 8:53 AM

## 2015-08-17 NOTE — Discharge Instructions (Signed)

## 2015-08-17 NOTE — Progress Notes (Signed)
[facility] PROGRESS NOTE  [name] IRS:[id number] DOB: 1921/04/01 DOA: 08/14/2015 PCP: [name], MD  Assessment/Plan: 1. Sepsis with shock with possible cellulitis and possible CAP 1. Sepsis has resolved 2. Pt had been continued on empiric vanc and aztreonam 3. Presenting leukocytosis of 13k, presented with fever of 101.4F rectally with tachycardia and lactic acidosis 4. Cont abx per above. Pt has shown improvement thus far 5. WBC has normalized. Pt remains afebrile. Lactate normalized 6. CT abd with dictated findings of "severe diverticulitis." However, discussed with the radiologist who states dictation was incorrect and should have read "severe diverticulosis" with "no evidence of diverticulitis" 7. LLE cellulitis appears improved. Will transition to PO doxycycline 2. ARF 1. Resolved. Cont monitor 2. Resumed lasix 3. Nausea/vomiting/abd pain 1. Pain mainly epigastric 2. abd US unremarkable 3. CT abd with severe diverticulosis without diverticulitis 4. Tolerating diet 4. Chronic afib 1. Stable, currently rate controlled 2. On coumadin, dosing per pharmacy 5. Hx LV dysfunction CHF, NYHA class 2 1. Last documented EF of 60-65% in 2015 2. Followed by Dr. Ellyn Hack 3. resumed lasix as pt was aggressively volume resuscitated  6. AAA 1. Stable 3.4cm infrarenal AAA on [place] 7. DVT prophylaxis 1. coumadin  Code Status: Full Family Communication: Pt in room Disposition Plan: Pending   Consultants:    Procedures:    Antibiotics:  Vancomycin 8/31>>>9/4  Aztreonam 8/31>>>9/4  Doxycycline 9/4>>>  HPI/Subjective: States LLE feels better and no longer as painful or erythematous  Objective: Filed Vitals:   08/17/15 0340 08/17/15 0400 08/17/15 0758 08/17/15 1200  BP: 141/64  140/60 157/65  Pulse: 69     Temp: 98.3 F (36.8 C)  97.3 F (36.3 C) 97.8 F (36.6 C)  TempSrc: Oral   Oral  Resp: 19 19 15 20   Height:      Weight:      SpO2: 95%  96%      Intake/Output Summary (Last 24 hours) at 08/17/15 1404 Last data filed at 08/17/15 1112  Gross per 24 hour  Intake    720 ml  Output   3450 ml  Net  -2730 ml   Filed Weights   08/15/15 0419 08/16/15 0400 08/16/15 2000  Weight: 76.9 kg (169 lb 8.5 oz) 79.606 kg (175 lb 8 oz) 83.8 kg (184 lb 11.9 oz)    Exam:   General:  Awake, in nad  Cardiovascular: regular, s1, s2  Respiratory: normal resp effort, no wheezing  Abdomen: soft, pos BS  Musculoskeletal: perfused, no clubbing, LLE edema, erythema improved with decreased tenderness  Data Reviewed: Basic Metabolic Panel:  Recent Labs Lab 08/14/15 0045 08/14/15 0525 08/15/15 0247 08/16/15 0234 08/17/15 0330  NA 133* 133* 133* 134* 134*  K 4.7 3.6 3.5 3.5 3.6  CL 95* 100* 104 102 101  CO2 26 25 22 25 27   GLUCOSE 132* 150* 142* 122* 147*  BUN 17 15 5* 7 6  CREATININE 1.14* 0.92 0.62 0.63 0.62  CALCIUM 9.2 7.9* 7.7* 8.6* 8.5*   Liver Function Tests:  Recent Labs Lab 08/14/15 0045 08/14/15 0525  AST 35 27  ALT 26 22  ALKPHOS 72 54  BILITOT 0.7 0.9  PROT 7.4 5.5*  ALBUMIN 4.1 3.0*    Recent Labs Lab 08/14/15 1104  LIPASE 25   No results for input(s): AMMONIA in the last 168 hours. CBC:  Recent Labs Lab 08/14/15 0045 08/14/15 0525 08/15/15 0247 08/16/15 0234  WBC 14.4* 13.2* 10.7* 8.0  NEUTROABS 10.8* 11.0*  --   --  HGB 13.5 10.9* 10.6* 11.0*  HCT 40.7 32.8* 32.4* 32.6*  MCV 100.2* 100.0 100.3* 99.1  PLT 208 160 142* 150   Cardiac Enzymes:  Recent Labs Lab 08/14/15 0525 08/14/15 1104 08/14/15 1900  TROPONINI 0.20* 0.14* 0.08*   BNP (last 3 results) No results for input(s): BNP in the last 8760 hours.  ProBNP (last 3 results) No results for input(s): PROBNP in the last 8760 hours.  CBG:  Recent Labs Lab 08/14/15 0314  GLUCAP 156*    Recent Results (from the past 240 hour(s))  Urine culture     Status: None   Collection Time: 08/14/15 12:57 AM  Result Value Ref Range  Status   Specimen Description URINE, CLEAN CATCH  Final   Special Requests NONE  Final   Culture MULTIPLE SPECIES PRESENT, SUGGEST RECOLLECTION  Final   Report Status 08/15/2015 FINAL  Final  Culture, blood (routine x 2)     Status: None (Preliminary result)   Collection Time: 08/14/15  1:20 AM  Result Value Ref Range Status   Specimen Description BLOOD RIGHT ARM  Final   Special Requests BOTTLES DRAWN AEROBIC AND ANAEROBIC 10CC  Final   Culture NO GROWTH 3 DAYS  Final   Report Status PENDING  Incomplete  Culture, blood (routine x 2)     Status: None (Preliminary result)   Collection Time: 08/14/15  1:46 AM  Result Value Ref Range Status   Specimen Description BLOOD RIGHT HAND  Final   Special Requests BOTTLES DRAWN AEROBIC ONLY 3CC  Final   Culture NO GROWTH 3 DAYS  Final   Report Status PENDING  Incomplete  MRSA PCR Screening     Status: None   Collection Time: 08/14/15  4:30 AM  Result Value Ref Range Status   MRSA by PCR NEGATIVE NEGATIVE Final    Comment:        The GeneXpert MRSA Assay (FDA approved for NASAL specimens only), is one component of a comprehensive MRSA colonization surveillance program. It is not intended to diagnose MRSA infection nor to guide or monitor treatment for MRSA infections.      Studies: No results found.  Scheduled Meds: . [START ON 08/18/2015] azithromycin  500 mg Oral Daily  . calcium carbonate  1 tablet Oral Daily  . cholecalciferol  1,000 Units Oral Daily  . diltiazem  240 mg Oral Daily  . doxycycline  100 mg Oral Q12H  . furosemide  40 mg Oral Once per day on Sun Tue Thu Sat  . [START ON 08/18/2015] furosemide  80 mg Oral Q M,W,F  . metoprolol succinate  50 mg Oral QHS  . omega-3 acid ethyl esters  1,000 mg Oral Daily  . pantoprazole  40 mg Oral Daily  . rosuvastatin  5 mg Oral QPM  . saccharomyces boulardii  250 mg Oral BID  . warfarin  7.5 mg Oral ONCE-1800  . Warfarin - Pharmacist Dosing Inpatient   Does not apply q1800    Continuous Infusions:    Principal Problem:   Sepsis Active Problems:   Chronic atrial fibrillation   Hypokalemia   Left ventricular diastolic dysfunction, NYHA class 2   AAA (abdominal aortic aneurysm)   Cellulitis of left lower extremity   ARF (acute renal failure)   Aortic aneurysm   Pyrexia    ,  K  [facility] Pager [phone]. If 7PM-7AM, please contact night-coverage at [email or web address], password [facility] 08/17/2015, 2:04 PM  LOS: 3 days

## 2015-08-18 ENCOUNTER — Inpatient Hospital Stay (HOSPITAL_COMMUNITY): Payer: PPO

## 2015-08-18 DIAGNOSIS — A419 Sepsis, unspecified organism: Secondary | ICD-10-CM

## 2015-08-18 DIAGNOSIS — L0390 Cellulitis, unspecified: Secondary | ICD-10-CM

## 2015-08-18 DIAGNOSIS — I82409 Acute embolism and thrombosis of unspecified deep veins of unspecified lower extremity: Secondary | ICD-10-CM

## 2015-08-18 LAB — PROTIME-INR
INR: 2.08 — AB (ref 0.00–1.49)
PROTHROMBIN TIME: 23.2 s — AB (ref 11.6–15.2)

## 2015-08-18 LAB — CBC
HCT: 33.9 % — ABNORMAL LOW (ref 36.0–46.0)
Hemoglobin: 11.2 g/dL — ABNORMAL LOW (ref 12.0–15.0)
MCH: 32.7 pg (ref 26.0–34.0)
MCHC: 33 g/dL (ref 30.0–36.0)
MCV: 99.1 fL (ref 78.0–100.0)
Platelets: 194 10*3/uL (ref 150–400)
RBC: 3.42 MIL/uL — ABNORMAL LOW (ref 3.87–5.11)
RDW: 12.9 % (ref 11.5–15.5)
WBC: 5.7 10*3/uL (ref 4.0–10.5)

## 2015-08-18 LAB — BASIC METABOLIC PANEL
Anion gap: 8 (ref 5–15)
BUN: 8 mg/dL (ref 6–20)
CALCIUM: 8.7 mg/dL — AB (ref 8.9–10.3)
CO2: 32 mmol/L (ref 22–32)
Chloride: 98 mmol/L — ABNORMAL LOW (ref 101–111)
Creatinine, Ser: 0.66 mg/dL (ref 0.44–1.00)
GFR calc Af Amer: >60 mL/min (ref >60)
GLUCOSE: 121 mg/dL — AB (ref 65–99)
Potassium: 3.7 mmol/L (ref 3.5–5.1)
Sodium: 138 mmol/L (ref 135–145)

## 2015-08-18 MED ORDER — DOXYCYCLINE HYCLATE 100 MG PO TABS: 100.0000 mg | ORAL_TABLET | Freq: Two times a day (BID) | ORAL | Status: DC

## 2015-08-18 MED ORDER — SACCHAROMYCES BOULARDII 250 MG PO CAPS: 250.0000 mg | ORAL_CAPSULE | Freq: Two times a day (BID) | ORAL | Status: DC

## 2015-08-18 MED ORDER — WARFARIN SODIUM 7.5 MG PO TABS: 7.5000 mg | ORAL_TABLET | Freq: Once | ORAL | Status: DC

## 2015-08-18 NOTE — Evaluation (Signed)
Occupational Therapy Evaluation and Discharge Patient Details Name: [name] MRN: [id number] DOB: Jun 07, 1921 Today's Date: 08/18/2015    History of Present Illness Admitted with Sepsis - possible sources left lower extremities cellulitis and also possible pneumonia; ARF, resolving;    Clinical Impression   This 81 yo female admitted with above presents to acute OT at a Mod I to S level and she has this at home. No further OT needs, we will sign off.    Follow Up Recommendations  No OT follow up    Equipment Recommendations  None recommended by OT       Precautions / Restrictions Precautions Precautions: Fall Precaution Comments: Fall risk greatly reduced with use of rW Restrictions Weight Bearing Restrictions: No      Mobility Bed Mobility Overal bed mobility: Modified Independent Bed Mobility: Supine to Sit     Supine to sit: Modified independent (Device/Increase time);HOB elevated        Transfers Overall transfer level: Needs assistance   Transfers: Sit to/from Stand Sit to Stand: Supervision                   ADL Overall ADL's : Needs assistance/impaired                                       General ADL Comments: Overall at a S level due to mild balance deficits     Vision Additional Comments: No change, wears bifocals          Pertinent Vitals/Pain Pain Assessment: No/denies pain     Hand Dominance Right   Extremity/Trunk Assessment Upper Extremity Assessment Upper Extremity Assessment: Overall WFL for tasks assessed           Communication Communication Communication: No difficulties   Cognition Arousal/Alertness: Awake/alert Behavior During Therapy: WFL for tasks assessed/performed Overall Cognitive Status: Within Functional Limits for tasks assessed                                Home Living Family/patient expects to be discharged to:: Private residence Living Arrangements:  Children Available Help at Discharge: Family;Available 24 hours/day Type of Home: Mobile home Home Access: Ramped entrance     Home Layout: One level     Bathroom Shower/Tub: Tub/shower unit;Curtain (and walk in shower) Shower/tub characteristics: Curtain Bathroom Toilet: Handicapped height     Home Equipment: Clinical cytogeneticist - 2 wheels;Cane - single point;Bedside commode          Prior Functioning/Environment Level of Independence: Independent with assistive device(s)        Comments: cane for amb    OT Diagnosis: Generalized weakness         OT Goals(Current goals can be found in the care plan section) Acute Rehab OT Goals Patient Stated Goal: to go home  OT Frequency:                End of Session Equipment Utilized During Treatment: Rolling walker Nurse Communication:  (Pt up in recliner without alarm--RN (CHAT) said she did not need one)  Activity Tolerance: Patient tolerated treatment well Patient left: in chair;with call bell/phone within reach   Time: 8546-2703 OT Time Calculation (min): 34 min Charges:  OT General Charges $OT Visit: 1 Procedure OT Evaluation $Initial OT Evaluation Tier I: 1 Procedure OT Treatments $Self Care/Home Management :  8-22 mins  Almon Register [phone] 08/18/2015, 10:53 AM

## 2015-08-18 NOTE — Care Management Important Message (Signed)
Important Message  Patient Details  Name: [name] MRN: [id number] Date of Birth: 07-Apr-1921   Medicare Important Message Given:  Yes-second notification given    Loann Quill 08/18/2015, 8:45 AM

## 2015-08-18 NOTE — Progress Notes (Signed)
[facility] [place] for Warfarin  Indication: atrial fibrillation, hx VTE  Allergies  Allergen Reactions  . Contrast Media [Iodinated Diagnostic Agents] Hives  . Levaquin [Levofloxacin] Itching and Rash  . Penicillins Rash    "haven't had it in years"  . Vancomycin Itching and Rash    Patient Measurements: Height: 5\' 3"  (160 cm) Weight: 143 lb 4.8 oz (65 kg) IBW/kg (Calculated) : 52.4  Vital Signs: Temp: 98.4 F (36.9 C) (09/05 0740) Temp Source: Oral (09/05 0740) BP: 128/44 mmHg (09/05 0740) Pulse Rate: 65 (09/05 0740)  Labs:  Recent Labs  08/16/15 0234 08/17/15 0330 08/18/15 0219  HGB 11.0*  --  11.2*  HCT 32.6*  --  33.9*  PLT 150  --  194  LABPROT 27.3* 23.9* 23.2*  INR 2.57* 2.16* 2.08*  CREATININE 0.63 0.62 0.66    Estimated Creatinine Clearance: 41.5 mL/min (by C-G formula based on Cr of 0.66).   Medical History: Past Medical History  Diagnosis Date  . Coronary artery disease     Nonobstructive  . GERD (gastroesophageal reflux disease)   . Headache(784.0)   . High cholesterol   . DVT (deep venous thrombosis), right 2005    "dry blood; after 8 foot fall"  . Chronic bronchitis     "get it basically 3 times/yr"  . Blood transfusion 1960  . H/O hiatal hernia   . Rheumatoid arthritis(714.0)   . Concussion w/o coma 3/54/5625    Complicated by subarachnoid hemorrhage.  "even now has times when she's not able to comprehend" (05/08/12)  . Chronic atrial fibrillation     Anticoagulated with warfarin, rate control with diltiazem and Toprol  . Pneumonia ~ 2010 AND 2013  . 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 Diverticulitis   . Diverticulosis   . External hemorrhoids   . Fatty liver   . AAA (abdominal aortic aneurysm)   . Edema of both legs     Chronic, thought to be secondary to DVTs  . Hx of bipolar disorder     Assessment: 81 yo female with cellulitis on on  doxycycline. She is on warfarin PTA for chronic afib and hx VTE. INR today remains therapeutic at 2.08 with trend down after lower dose given on 9/2.   Home dose: 5 mg daily except 7.5 mg MWF  Goal of Therapy:  INR 2-3 Monitor platelets by anticoagulation protocol: Yes   Plan:  - Warfarin 7.5 mg PO x 1 tonight - Daily PT/INR - Monitor CBC and for s/s bleeding  Hildred Laser, Pharm D 08/18/2015 8:54 AM

## 2015-08-18 NOTE — Progress Notes (Signed)
Discharged home accompanied by daughter and grand son, stable. Discharge instructions and prescription given to pt. Belongings taken home.

## 2015-08-18 NOTE — Discharge Summary (Addendum)
Physician Discharge Summary  [name] DGU:[id number] DOB: 10-08-1921 DOA: 08/14/2015  PCP: [name], MD  Admit date: 08/14/2015 Discharge date: 08/18/2015  Time spent: 20 minutes  Recommendations for Outpatient Follow-up:  Follow up with  Discharge Diagnoses:  Principal Problem:   Sepsis due to cellulitis Active Problems:   Chronic atrial fibrillation   Sepsis   Hypokalemia   Left ventricular diastolic dysfunction, NYHA class 2   AAA (abdominal aortic aneurysm)   Cellulitis of left lower extremity   ARF (acute renal failure)   Aortic aneurysm   Pyrexia   Discharge Condition: improved  Diet recommendation: Heart healthy  Filed Weights   08/16/15 2000 08/18/15 0509 08/18/15 0528  Weight: 83.8 kg (184 lb 11.9 oz) 74.254 kg (163 lb 11.2 oz) 65 kg (143 lb 4.8 oz)    History of present illness:  Please see admit h and p from 9/1 for details. Briefly, pt presented with fever and chills. Pt was found to be floridly septic and in shock, admitted to stepdown for further work up.  Hospital Course:  1. Sepsis with shock with possible cellulitis and possible CAP 1. Sepsis has resolved 2. Pt had been continued on empiric vanc and aztreonam 3. Presenting leukocytosis of 13k, presented with fever of 101.67F rectally with tachycardia and lactic acidosis 4. WBC has normalized. Pt remains afebrile. Lactate normalized 5. CT abd with dictated findings of "severe diverticulitis." However, discussed with the radiologist who states dictation was incorrect and should have read "severe diverticulosis" with "no evidence of diverticulitis" 6. LLE cellulitis appears improved. Have transitioned to PO doxycycline for 5 more days post discharge to complete 10 days of abx 2. ARF 1. Resolved. Cont monitor 2. Resumed lasix 3. Nausea/vomiting/abd pain 1. Pain mainly epigastric 2. abd US unremarkable 3. CT abd with severe diverticulosis without diverticulitis 4. Tolerating diet 4. Chronic  afib 1. Stable, currently rate controlled 2. On coumadin, dosing per pharmacy 5. Hx LV dysfunction CHF, NYHA class 2 1. Last documented EF of 60-65% in 2015 2. Followed by Dr. Ellyn Hack 3. resumed lasix as pt was aggressively volume resuscitated  6. AAA 1. Stable 3.4cm infrarenal AAA on [place] 7. DVT prophylaxis 1. coumadin  Discharge Exam: Filed Vitals:   08/18/15 0528 08/18/15 0740 08/18/15 1226 08/18/15 1300  BP:  128/44 170/46 165/41  Pulse:  65 68   Temp:  98.4 F (36.9 C)    TempSrc:  Oral    Resp:   18   Height:      Weight: 65 kg (143 lb 4.8 oz)     SpO2:  98% 98%     General: awake, in nad Cardiovascular: regular, s1, s2 Respiratory: normal resp effort, no wheezing  Discharge Instructions     Medication List    TAKE these medications        acetaminophen 500 MG tablet  Commonly known as:  TYLENOL  Take 1,000 mg by mouth every 6 (six) hours as needed for pain.     calcium carbonate 1250 (500 CA) MG tablet  Commonly known as:  OS-CAL - dosed in mg of elemental calcium  Take 1 tablet by mouth every morning.     cholecalciferol 1000 UNITS tablet  Commonly known as:  VITAMIN D  Take 1,000 Units by mouth daily.     Cinnamon 500 MG capsule  Take 1,000 mg by mouth daily.     diltiazem 240 MG 24 hr capsule  Commonly known as:  CARDIZEM CD  Take 240  mg by mouth daily.     doxycycline 100 MG tablet  Commonly known as:  VIBRA-TABS  Take 1 tablet (100 mg total) by mouth every 12 (twelve) hours.     Fish Oil 1000 MG Caps  Take 1,000 mg by mouth daily.     furosemide 40 MG tablet  Commonly known as:  LASIX  Take 40-80 mg by mouth daily. 2 (80mg ) tablets on mon,wed and Friday. All other days 1 tablet (40mg )     metoprolol succinate 50 MG 24 hr tablet  Commonly known as:  TOPROL-XL  Take 50 mg by mouth at bedtime.     multivitamins ther. w/minerals Tabs tablet  Take 1 tablet by mouth daily.     PRILOSEC 20 MG capsule  Generic drug:  omeprazole  Take 20  mg by mouth 2 (two) times daily.     psyllium 58.6 % powder  Commonly known as:  METAMUCIL  Take 1 packet by mouth 2 (two) times daily.     rosuvastatin 5 MG tablet  Commonly known as:  CRESTOR  Take 5 mg by mouth every evening.     saccharomyces boulardii 250 MG capsule  Commonly known as:  FLORASTOR  Take 1 capsule (250 mg total) by mouth 2 (two) times daily.     spironolactone 25 MG tablet  Commonly known as:  ALDACTONE  Take 25 mg by mouth every morning.     warfarin 5 MG tablet  Commonly known as:  COUMADIN  Take 5-7.5 mg by mouth daily. Take 1.5  Tablet (7.5) on mon,wed,friday. All other days take 1 tablet (5mg )       Allergies  Allergen Reactions  . Contrast Media [Iodinated Diagnostic Agents] Hives  . Levaquin [Levofloxacin] Itching and Rash  . Penicillins Rash    "haven't had it in years"  . Vancomycin Itching and Rash   Follow-up Information    Follow up with [name], MD. Schedule an appointment as soon as possible for a visit in 1 week.   Specialty:  Internal Medicine   Why:  as already scheduled   Contact information:   [address] [place] [address] [phone]       Follow up with [facility].   Why:  [facility] will call 24-48 hr to set up for [facility]pt    Contact information:   [address] [place] [address] [phone]        The results of significant diagnostics from this hospitalization (including imaging, microbiology, ancillary and laboratory) are listed below for reference.    Significant Diagnostic Studies: Ct Abdomen Pelvis Wo Contrast  08/16/2015   ADDENDUM REPORT: 08/16/2015 09:05  ADDENDUM: Corrected report:  IMPRESSION: Severe diverticulosis affecting the entire length of the colon past the hepatic flexure, however no evidence of diverticulitis on this noncontrasted study.  Findings discussed with Dr. Wyline Copas.   Electronically Signed   By: Nolon Nations M.D.   On: 08/16/2015 09:05    08/16/2015   CLINICAL DATA:  Bilateral lower abdominal pain, vomiting and constipation. History of diverticulitis.  EXAM: CT ABDOMEN AND PELVIS WITHOUT CONTRAST  TECHNIQUE: Multidetector CT imaging of the abdomen and pelvis was performed following the standard protocol without IV contrast.  COMPARISON:  Abdominal ultrasound dated 08/14/2015, CT of the abdomen pelvis dated 08/22/2014.  FINDINGS: Lower chest: Bilateral small pleural effusions with atelectasis of the right lower lobe. Atherosclerotic coronary artery disease and valvular calcifications are noted.  Hepatobiliary: No masses or  other significant abnormality identified. The gallbladder appears normal.  Pancreas: No evidence of mass, inflammatory changes, or other significant abnormality.  Spleen:  Within normal limits in size and appearance.  Adrenal Glands:  No masses identified.  Kidneys/Urinary Tract: No evidence of urolithiasis or hydronephrosis. There is a 6.7 cm mostly exophytic cyst off of the lower pole of the left kidney. There is bilateral perirenal stranding. No masses or calculi seen involving the lower urinary tract.  Stomach/Bowel/Peritoneum: There is thickening of the posterior gastric wall of the gastric body. There is a 2 cm outpouching of the proximal duodenum, which may represent a diverticulum. There is no evidence of small bowel obstruction. The colon is redundant but normal in caliber. Numerous diverticula seen throughout the colon, severely affecting the entire length of the colon pass the hepatic flexure. However there is no evidence of her pericolonic inflammation wall abscess formation.  Vascular/Lymphatic: No pathologically enlarged lymph nodes identified. No other significant abnormality noted.  Reproductive: There has been a prior hysterectomy. No adnexal masses are seen.  Other: The aorta is affected by heavy atherosclerotic calcifications. There is fusiform aneurysmal dilation of the infrarenal aorta with maximum diameter of  2.6 cm  Musculoskeletal: Osteoarthritic changes and a reversed S shaped scoliosis of the lumbosacral spine are noted. Osteoarthritic changes of the right hip are also seen. No evidence of suspicious osseous lesions.  IMPRESSION: Severe diverticulitis affecting the entire length of the colon pass the hepatic flexure, however no evidence of diverticulosis on this noncontrasted study.  Probable diverticulum off of the second portion of the duodenum.  Large but simple in appearance left renal cyst.  Thickening of the posterior aspect of the gastric body, incompletely visualized due to lack of IV contrast. Direct visualization may be considered if this is of clinical concern.  Atherosclerotic disease of the aorta with stable infrarenal fusiform abdominal aortic aneurysm measuring 2.6 cm in maximum cross-section diameter.  Small bilateral pleural effusions with right lower lobe atelectasis.  Atherosclerotic disease of the coronary arteries.  Electronically Signed: By: Fidela Salisbury M.D. On: 08/15/2015 07:59   US Abdomen Complete  08/14/2015   CLINICAL DATA:  81 year old female with nausea and abdominal aortic aneurysm.  EXAM: ULTRASOUND ABDOMEN COMPLETE  COMPARISON:  05/20/2015 abdominal sonogram. 08/22/2014 CT abdomen/pelvis.  FINDINGS: Gallbladder: No gallstones or sludge are detected in the gallbladder (the previously described 6 mm gallstone on the 05/20/2015 sonogram is not visualized on today's scan). The gallbladder wall is top-normal in thickness. There is no significant gallbladder distention. No pericholecystic fluid or sonographic Murphy sign.  Common bile duct: Diameter: 4 mm  Liver: Liver appears normal in size and configuration. Liver parenchymal echogenicity and echotexture are within normal limits. No liver mass is detected. The main portal vein is patent with appropriate flow direction.  IVC: No abnormality visualized.  Pancreas: Visualized portion unremarkable.  Spleen: Size and appearance within  normal limits.  Right Kidney: Length: 11.6 cm. Echogenicity within normal limits. No mass or hydronephrosis visualized.  Left Kidney: Length: 11.1 cm. Echogenicity within normal limits. No hydronephrosis. Exophytic simple 6.6 x 6.1 x 6.3 cm renal cyst in the interpolar left kidney, previously 6.2 x 6.1 x 6.1 cm, minimally increased. No new left renal lesions.  Abdominal aorta: There is a stable infrarenal abdominal aortic aneurysm measuring 3.4 cm in maximum AP diameter, not appreciably changed compared to the 08/22/2014 CT study.  Other findings: None.  IMPRESSION: 1. No cholelithiasis or gallbladder sludge detected on today's scan. No evidence of  acute cholecystitis. No biliary ductal dilatation. 2. Stable 3.4 cm infrarenal abdominal aortic aneurysm. 3. Stable large simple left renal cyst. Otherwise normal abdominal sonogram.   Electronically Signed   By: Ilona Sorrel M.D.   On: 08/14/2015 11:30   Dg Chest Port 1 View  08/15/2015   CLINICAL DATA:  CHF  EXAM: PORTABLE CHEST - 1 VIEW  COMPARISON:  the previous day's study  FINDINGS: Stable mild cardiomegaly. Improved bibasilar aeration with some residual bibasilar subsegmental atelectasis or infiltrate. Atheromatous aorta. Possible small left pleural effusion.  No pneumothorax. Visualized skeletal structures are unremarkable.  IMPRESSION: 1. Improved aeration with mild residual bibasilar opacities.   Electronically Signed   By: Lucrezia Europe M.D.   On: 08/15/2015 09:34   Dg Chest Portable 1 View  08/14/2015   CLINICAL DATA:  Dyspnea and cough.  Fever.  EXAM: PORTABLE CHEST - 1 VIEW  COMPARISON:  11/25/2013  FINDINGS: There are central ground-glass opacities and patchy basilar opacities which are new from 11/25/2013. There is mild cardiomegaly. There is mild vascular and interstitial prominence. The findings may represent congestive heart failure.  IMPRESSION: Mild central ground-glass opacities and patchy central basilar opacity, as well as vascular and  interstitial prominence. This may represent congestive heart failure although infectious infiltrates cannot be entirely excluded.   Electronically Signed   By: Andreas Newport M.D.   On: 08/14/2015 01:49    Microbiology: Recent Results (from the past 240 hour(s))  Urine culture     Status: None   Collection Time: 08/14/15 12:57 AM  Result Value Ref Range Status   Specimen Description URINE, CLEAN CATCH  Final   Special Requests NONE  Final   Culture MULTIPLE SPECIES PRESENT, SUGGEST RECOLLECTION  Final   Report Status 08/15/2015 FINAL  Final  Culture, blood (routine x 2)     Status: None (Preliminary result)   Collection Time: 08/14/15  1:20 AM  Result Value Ref Range Status   Specimen Description BLOOD RIGHT ARM  Final   Special Requests BOTTLES DRAWN AEROBIC AND ANAEROBIC 10CC  Final   Culture NO GROWTH 4 DAYS  Final   Report Status PENDING  Incomplete  Culture, blood (routine x 2)     Status: None (Preliminary result)   Collection Time: 08/14/15  1:46 AM  Result Value Ref Range Status   Specimen Description BLOOD RIGHT HAND  Final   Special Requests BOTTLES DRAWN AEROBIC ONLY 3CC  Final   Culture NO GROWTH 4 DAYS  Final   Report Status PENDING  Incomplete  MRSA PCR Screening     Status: None   Collection Time: 08/14/15  4:30 AM  Result Value Ref Range Status   MRSA by PCR NEGATIVE NEGATIVE Final    Comment:        The GeneXpert MRSA Assay (FDA approved for NASAL specimens only), is one component of a comprehensive MRSA colonization surveillance program. It is not intended to diagnose MRSA infection nor to guide or monitor treatment for MRSA infections.      Labs: Basic Metabolic Panel:  Recent Labs Lab 08/14/15 0525 08/15/15 0247 08/16/15 0234 08/17/15 0330 08/18/15 0219  NA 133* 133* 134* 134* 138  K 3.6 3.5 3.5 3.6 3.7  CL 100* 104 102 101 98*  CO2 25 22 25 27  32  GLUCOSE 150* 142* 122* 147* 121*  BUN 15 5* 7 6 8   CREATININE 0.92 0.62 0.63 0.62 0.66   CALCIUM 7.9* 7.7* 8.6* 8.5* 8.7*   Liver Function Tests:  Recent Labs Lab 08/14/15 0045 08/14/15 0525  AST 35 27  ALT 26 22  ALKPHOS 72 54  BILITOT 0.7 0.9  PROT 7.4 5.5*  ALBUMIN 4.1 3.0*    Recent Labs Lab 08/14/15 1104  LIPASE 25   No results for input(s): AMMONIA in the last 168 hours. CBC:  Recent Labs Lab 08/14/15 0045 08/14/15 0525 08/15/15 0247 08/16/15 0234 08/18/15 0219  WBC 14.4* 13.2* 10.7* 8.0 5.7  NEUTROABS 10.8* 11.0*  --   --   --   HGB 13.5 10.9* 10.6* 11.0* 11.2*  HCT 40.7 32.8* 32.4* 32.6* 33.9*  MCV 100.2* 100.0 100.3* 99.1 99.1  PLT 208 160 142* 150 194   Cardiac Enzymes:  Recent Labs Lab 08/14/15 0525 08/14/15 1104 08/14/15 1900  TROPONINI 0.20* 0.14* 0.08*   BNP: BNP (last 3 results) No results for input(s): BNP in the last 8760 hours.  ProBNP (last 3 results) No results for input(s): PROBNP in the last 8760 hours.  CBG:  Recent Labs Lab 08/14/15 0314  GLUCAP 156*    Signed:  ,  K  [facility] 08/18/2015, 5:38 PM

## 2015-08-18 NOTE — Progress Notes (Signed)
Md made aware about the bp 332-951 systolic asymptomatic and increased aldactone and claimed that she may be discharged home.

## 2015-08-18 NOTE — Progress Notes (Signed)
VASCULAR LAB PRELIMINARY  PRELIMINARY  PRELIMINARY  PRELIMINARY  Bilateral lower extremity venous duplex completed.    Preliminary report:  There is no DVT or SVT noted in the bilateral lower extremities.   , , RVT 08/18/2015, 11:29 AM

## 2015-08-18 NOTE — Care Management Note (Signed)
Case Management Note  Patient Details  Name: [name] MRN: [id number] Date of Birth: 1921/04/17  Subjective/Objective:       Adm w sepsis             Action/Plan: lives w fam, pcp dr Georges Mouse   Expected Discharge Date:                Expected Discharge Plan:  [place]  In-House Referral:     Discharge planning Services  CM Consult  Post Acute Care Choice:  Durable Medical Equipment, Home Health Choice offered to:  Parent  DME Arranged:  Eelevated commode seat DME Agency:  [facility] Arranged:  PT, Nurse's Aide [place] Agency:  [place]  Status of Service:  Completed, signed off  Medicare Important Message Given:  Yes-second notification given Date Medicare IM Given:    Medicare IM give by:    Date Additional Medicare IM Given:    Additional Medicare Important Message give by:     If discussed at [place] of Stay Meetings, dates discussed:    Additional Comments: went over list of hhc agencies and pt uses [facility] for [facility]. Ref to marie at [facility]. Pt has multi walkers and a tub seat. She has elevated toilet seat but is broken. She does not want 3n1 bsc. She prefers elev toilet seat w handles. Have alerted [facility] of order and pt wishes.  Lacretia Leigh, RN 08/18/2015, 12:01 PM

## 2015-08-19 LAB — CULTURE, BLOOD (ROUTINE X 2)
CULTURE: NO GROWTH
Culture: NO GROWTH

## 2015-08-19 NOTE — Patient Outreach (Signed)
[facility]) Care Management  08/19/2015  [name] 12/11/1921 [id number]   Referral from Natividad Brood, RN, assigned Jacqlyn Larsen, RN.  Thanks, Ronnell Freshwater. [place], [place] Assistant Phone: [phone] Fax: [phone]

## 2015-08-20 ENCOUNTER — Other Ambulatory Visit: Payer: Self-pay | Admitting: *Deleted

## 2015-08-20 NOTE — Patient Outreach (Signed)
08/20/15- Telephone call for transition of care week 1,  Pt hospitalized 9/1 through 08/18/15 for cellulitis/ sepsis, spoke with daughter [name] [facility]), HIIPA verified, pt is 81 years old and prefers RN CM speak with daughter.  Pt and daughter [name] live together and daughter oversees medications and other aspects of pt care.  Pt is active with [facility]. Daughter does not feel a home visit is needed at present and would like this reassessed at later date. RN CM faxed today's visit note/ transition of care to Dr. Maudie Mercury informing pt enrolled in transition of care with [facility], consent form mailed to patient's home.  THN CM Care Plan Problem One        Most Recent Value   Care Plan Problem One  high risk for readmission to hospital related to cellulitis/ sepsis   Role Documenting the Problem One  Care Management [place] for Problem One  Active   THN Long Term Goal (31-90 days)  pt will have no hospital admissions within 90 days   THN Long Term Goal Start Date  08/20/15   Interventions for Problem One Long Term Goal  RN CM provided 24 hour nurse line number, reviewed other resources such as home health and on call nurse, MD.  RN CM reviewed medications over the phone with daughter.   THN CM Short Term Goal #1 (0-30 days)  pt will see primary MD within 7-10 days of discharge   [facility] CM Short Term Goal #1 Start Date  08/20/15   Interventions for Short Term Goal #1  Reviewed discharge instructions with daughter including upcoming appointments, pt to see Dr. Maudie Mercury 08/27/15 (per daughter)   [facility] CM Short Term Goal #2 (0-30 days)  pt wil have no worsening cellulitis/ infection within 30 days.   THN CM Short Term Goal #2 Start Date  08/20/15   Interventions for Short Term Goal #2  RN CM reviewed signs/ symptoms infection (redness, warmth, fever, etc.) and importance of calling MD early with change in health status.        PLAN Continue weekly transition of care calls.  Jacqlyn Larsen [facility],  [place] Coordinator [phone]

## 2015-08-28 ENCOUNTER — Encounter (HOSPITAL_COMMUNITY): Payer: Self-pay | Admitting: General Practice

## 2015-08-28 ENCOUNTER — Inpatient Hospital Stay (HOSPITAL_COMMUNITY)
Admission: AD | Admit: 2015-08-28 | Discharge: 2015-09-03 | DRG: 603 | Disposition: A | Discharge status: Skilled Nursing Facility | Payer: PPO | Source: Ambulatory Visit | Attending: Internal Medicine | Admitting: Internal Medicine

## 2015-08-28 ENCOUNTER — Other Ambulatory Visit: Payer: Self-pay | Admitting: *Deleted

## 2015-08-28 DIAGNOSIS — I10 Essential (primary) hypertension: Secondary | ICD-10-CM | POA: Diagnosis present

## 2015-08-28 DIAGNOSIS — L0390 Cellulitis, unspecified: Secondary | ICD-10-CM | POA: Diagnosis present

## 2015-08-28 DIAGNOSIS — Z86718 Personal history of other venous thrombosis and embolism: Secondary | ICD-10-CM | POA: Diagnosis not present

## 2015-08-28 DIAGNOSIS — E871 Hypo-osmolality and hyponatremia: Secondary | ICD-10-CM | POA: Diagnosis present

## 2015-08-28 DIAGNOSIS — K5900 Constipation, unspecified: Secondary | ICD-10-CM | POA: Diagnosis present

## 2015-08-28 DIAGNOSIS — I4891 Unspecified atrial fibrillation: Secondary | ICD-10-CM | POA: Diagnosis present

## 2015-08-28 DIAGNOSIS — Z91041 Radiographic dye allergy status: Secondary | ICD-10-CM

## 2015-08-28 DIAGNOSIS — Z96651 Presence of right artificial knee joint: Secondary | ICD-10-CM | POA: Diagnosis present

## 2015-08-28 DIAGNOSIS — I2510 Atherosclerotic heart disease of native coronary artery without angina pectoris: Secondary | ICD-10-CM | POA: Diagnosis present

## 2015-08-28 DIAGNOSIS — R609 Edema, unspecified: Secondary | ICD-10-CM | POA: Diagnosis not present

## 2015-08-28 DIAGNOSIS — I482 Chronic atrial fibrillation, unspecified: Secondary | ICD-10-CM | POA: Diagnosis present

## 2015-08-28 DIAGNOSIS — Z9841 Cataract extraction status, right eye: Secondary | ICD-10-CM

## 2015-08-28 DIAGNOSIS — Z8551 Personal history of malignant neoplasm of bladder: Secondary | ICD-10-CM | POA: Diagnosis not present

## 2015-08-28 DIAGNOSIS — I714 Abdominal aortic aneurysm, without rupture: Secondary | ICD-10-CM | POA: Diagnosis present

## 2015-08-28 DIAGNOSIS — D649 Anemia, unspecified: Secondary | ICD-10-CM | POA: Diagnosis present

## 2015-08-28 DIAGNOSIS — Z961 Presence of intraocular lens: Secondary | ICD-10-CM | POA: Diagnosis present

## 2015-08-28 DIAGNOSIS — Z79899 Other long term (current) drug therapy: Secondary | ICD-10-CM | POA: Diagnosis not present

## 2015-08-28 DIAGNOSIS — R600 Localized edema: Secondary | ICD-10-CM | POA: Diagnosis present

## 2015-08-28 DIAGNOSIS — E780 Pure hypercholesterolemia: Secondary | ICD-10-CM | POA: Diagnosis present

## 2015-08-28 DIAGNOSIS — Z7901 Long term (current) use of anticoagulants: Secondary | ICD-10-CM | POA: Diagnosis not present

## 2015-08-28 DIAGNOSIS — Z90710 Acquired absence of both cervix and uterus: Secondary | ICD-10-CM

## 2015-08-28 DIAGNOSIS — Z87891 Personal history of nicotine dependence: Secondary | ICD-10-CM

## 2015-08-28 DIAGNOSIS — I739 Peripheral vascular disease, unspecified: Secondary | ICD-10-CM | POA: Diagnosis present

## 2015-08-28 DIAGNOSIS — Z881 Allergy status to other antibiotic agents status: Secondary | ICD-10-CM

## 2015-08-28 DIAGNOSIS — I4821 Permanent atrial fibrillation: Secondary | ICD-10-CM | POA: Diagnosis present

## 2015-08-28 DIAGNOSIS — Z9842 Cataract extraction status, left eye: Secondary | ICD-10-CM

## 2015-08-28 DIAGNOSIS — Z880 Allergy status to penicillin: Secondary | ICD-10-CM

## 2015-08-28 DIAGNOSIS — K219 Gastro-esophageal reflux disease without esophagitis: Secondary | ICD-10-CM | POA: Diagnosis present

## 2015-08-28 HISTORY — DX: Other chronic pain: G89.29

## 2015-08-28 HISTORY — DX: Urinary tract infection, site not specified: N39.0

## 2015-08-28 HISTORY — DX: Frequency of micturition: R35.0

## 2015-08-28 HISTORY — DX: Dorsalgia, unspecified: M54.9

## 2015-08-28 HISTORY — DX: Migraine, unspecified, not intractable, without status migrainosus: G43.909

## 2015-08-28 HISTORY — DX: Headache: R51

## 2015-08-28 HISTORY — DX: Headache, unspecified: R51.9

## 2015-08-28 HISTORY — DX: Family history of other specified conditions: Z84.89

## 2015-08-28 HISTORY — DX: Anxiety disorder, unspecified: F41.9

## 2015-08-28 LAB — CBC WITH DIFFERENTIAL/PLATELET
BASOS ABS: 0 10*3/uL (ref 0.0–0.1)
Basophils Relative: 0 %
EOS ABS: 0.7 10*3/uL (ref 0.0–0.7)
EOS PCT: 5 %
HCT: 33.9 % — ABNORMAL LOW (ref 36.0–46.0)
Hemoglobin: 11.2 g/dL — ABNORMAL LOW (ref 12.0–15.0)
Lymphocytes Relative: 15 %
Lymphs Abs: 2.3 10*3/uL (ref 0.7–4.0)
MCH: 32.8 pg (ref 26.0–34.0)
MCHC: 33 g/dL (ref 30.0–36.0)
MCV: 99.4 fL (ref 78.0–100.0)
Monocytes Absolute: 0.9 10*3/uL (ref 0.1–1.0)
Monocytes Relative: 6 %
Neutro Abs: 11 10*3/uL — ABNORMAL HIGH (ref 1.7–7.7)
Neutrophils Relative %: 74 %
PLATELETS: 236 10*3/uL (ref 150–400)
RBC: 3.41 MIL/uL — AB (ref 3.87–5.11)
RDW: 13.3 % (ref 11.5–15.5)
WBC: 14.9 10*3/uL — AB (ref 4.0–10.5)

## 2015-08-28 LAB — COMPREHENSIVE METABOLIC PANEL
ALK PHOS: 55 U/L (ref 38–126)
ALT: 16 U/L (ref 14–54)
ANION GAP: 7 (ref 5–15)
AST: 21 U/L (ref 15–41)
Albumin: 2.9 g/dL — ABNORMAL LOW (ref 3.5–5.0)
BUN: 10 mg/dL (ref 6–20)
CALCIUM: 8.2 mg/dL — AB (ref 8.9–10.3)
CHLORIDE: 98 mmol/L — AB (ref 101–111)
CO2: 25 mmol/L (ref 22–32)
Creatinine, Ser: 0.92 mg/dL (ref 0.44–1.00)
GFR calc non Af Amer: 53 mL/min — ABNORMAL LOW (ref >60)
Glucose, Bld: 151 mg/dL — ABNORMAL HIGH (ref 65–99)
Potassium: 3.8 mmol/L (ref 3.5–5.1)
SODIUM: 130 mmol/L — AB (ref 135–145)
Total Bilirubin: 0.7 mg/dL (ref 0.3–1.2)
Total Protein: 6 g/dL — ABNORMAL LOW (ref 6.5–8.1)

## 2015-08-28 LAB — PROTIME-INR
INR: 3.01 — AB (ref 0.00–1.49)
Prothrombin Time: 30.7 seconds — ABNORMAL HIGH (ref 11.6–15.2)

## 2015-08-28 LAB — APTT: APTT: 53 s — AB (ref 24–37)

## 2015-08-28 MED ORDER — DEXTROSE 5 % IV SOLN
1.0000 g | Freq: Three times a day (TID) | INTRAVENOUS | Status: DC
  Filled 2015-08-28: qty 1

## 2015-08-28 MED ORDER — DILTIAZEM HCL ER COATED BEADS 120 MG PO CP24
240.0000 mg | ORAL_CAPSULE | Freq: Every day | ORAL | Status: DC
  Administered 2015-08-29 – 2015-09-03 (×6): 240 mg via ORAL
  Filled 2015-08-28 (×7): qty 2

## 2015-08-28 MED ORDER — SODIUM CHLORIDE 0.9 % IJ SOLN
3.0000 mL | Freq: Two times a day (BID) | INTRAMUSCULAR | Status: DC
  Administered 2015-09-02: 3 mL via INTRAVENOUS

## 2015-08-28 MED ORDER — SPIRONOLACTONE 25 MG PO TABS
25.0000 mg | ORAL_TABLET | ORAL | Status: DC
  Administered 2015-08-29 – 2015-09-03 (×6): 25 mg via ORAL
  Filled 2015-08-28 (×6): qty 1

## 2015-08-28 MED ORDER — SODIUM CHLORIDE 0.9 % IJ SOLN: 3.0000 mL | INTRAMUSCULAR | Status: DC | PRN

## 2015-08-28 MED ORDER — VANCOMYCIN HCL IN DEXTROSE 750-5 MG/150ML-% IV SOLN
750.0000 mg | INTRAVENOUS | Status: DC
  Administered 2015-08-29 – 2015-09-01 (×4): 750 mg via INTRAVENOUS
  Filled 2015-08-28 (×4): qty 150

## 2015-08-28 MED ORDER — SODIUM CHLORIDE 0.9 % IJ SOLN
3.0000 mL | Freq: Two times a day (BID) | INTRAMUSCULAR | Status: DC
  Administered 2015-08-28 – 2015-09-01 (×4): 3 mL via INTRAVENOUS

## 2015-08-28 MED ORDER — FUROSEMIDE 10 MG/ML IJ SOLN
40.0000 mg | Freq: Two times a day (BID) | INTRAMUSCULAR | Status: DC
  Administered 2015-08-28 – 2015-08-29 (×3): 40 mg via INTRAVENOUS
  Filled 2015-08-28 (×3): qty 4

## 2015-08-28 MED ORDER — SODIUM CHLORIDE 0.9 % IV SOLN: 250.0000 mL | INTRAVENOUS | Status: DC | PRN

## 2015-08-28 MED ORDER — VANCOMYCIN HCL IN DEXTROSE 1-5 GM/200ML-% IV SOLN
1000.0000 mg | Freq: Once | INTRAVENOUS | Status: AC
  Administered 2015-08-28: 1000 mg via INTRAVENOUS
  Filled 2015-08-28: qty 200

## 2015-08-28 MED ORDER — OMEGA-3-ACID ETHYL ESTERS 1 G PO CAPS
1.0000 g | ORAL_CAPSULE | Freq: Two times a day (BID) | ORAL | Status: DC
  Administered 2015-08-28 – 2015-09-03 (×12): 1 g via ORAL
  Filled 2015-08-28 (×12): qty 1

## 2015-08-28 MED ORDER — PANTOPRAZOLE SODIUM 40 MG PO TBEC
40.0000 mg | DELAYED_RELEASE_TABLET | Freq: Every day | ORAL | Status: DC
  Administered 2015-08-28 – 2015-09-03 (×7): 40 mg via ORAL
  Filled 2015-08-28 (×7): qty 1

## 2015-08-28 MED ORDER — SACCHAROMYCES BOULARDII 250 MG PO CAPS
250.0000 mg | ORAL_CAPSULE | Freq: Two times a day (BID) | ORAL | Status: DC
Start: 2015-08-28 — End: 2015-09-03
  Administered 2015-08-28 – 2015-09-03 (×12): 250 mg via ORAL
  Filled 2015-08-28 (×12): qty 1

## 2015-08-28 MED ORDER — DEXTROSE 5 % IV SOLN
1.0000 g | Freq: Once | INTRAVENOUS | Status: AC
  Administered 2015-08-28: 1 g via INTRAVENOUS
  Filled 2015-08-28: qty 1

## 2015-08-28 MED ORDER — DEXTROSE 5 % IV SOLN
1.0000 g | Freq: Three times a day (TID) | INTRAVENOUS | Status: DC
  Administered 2015-08-29 – 2015-08-31 (×7): 1 g via INTRAVENOUS
  Filled 2015-08-28 (×9): qty 1

## 2015-08-28 MED ORDER — ACETAMINOPHEN 650 MG RE SUPP: 650.0000 mg | Freq: Four times a day (QID) | RECTAL | Status: DC | PRN

## 2015-08-28 MED ORDER — ACETAMINOPHEN 325 MG PO TABS
650.0000 mg | ORAL_TABLET | Freq: Four times a day (QID) | ORAL | Status: DC | PRN
  Administered 2015-08-28 – 2015-09-02 (×4): 650 mg via ORAL
  Filled 2015-08-28 (×5): qty 2

## 2015-08-28 MED ORDER — PSYLLIUM 95 % PO PACK
1.0000 | PACK | Freq: Every day | ORAL | Status: DC
  Administered 2015-08-28 – 2015-09-03 (×7): 1 via ORAL
  Filled 2015-08-28 (×6): qty 1

## 2015-08-28 NOTE — Progress Notes (Signed)
ANTIBIOTIC CONSULT NOTE - INITIAL  Pharmacy Consult for vancomycin and aztreonam Indication: cellulitis  Allergies  Allergen Reactions  . Contrast Media [Iodinated Diagnostic Agents] Hives  . Levaquin [Levofloxacin] Itching and Rash  . Penicillins Rash    "haven't had it in years"  . Vancomycin Itching and Rash    Patient Measurements: Height: 5' 4.5" (163.8 cm) Weight: 165 lb 9.1 oz (75.1 kg) IBW/kg (Calculated) : 55.85 Adjusted Body Weight:   Vital Signs: Temp: 98.4 F (36.9 C) (09/15 1156) Temp Source: Oral (09/15 1156) BP: 124/62 mmHg (09/15 1156) Pulse Rate: 85 (09/15 1156) Intake/Output from previous day:   Intake/Output from this shift:    Labs: No results for input(s): WBC, HGB, PLT, LABCREA, CREATININE in the last 72 hours. Estimated Creatinine Clearance: 46 mL/min (by C-G formula based on Cr of 0.66). No results for input(s): VANCOTROUGH, VANCOPEAK, VANCORANDOM, GENTTROUGH, GENTPEAK, GENTRANDOM, TOBRATROUGH, TOBRAPEAK, TOBRARND, AMIKACINPEAK, AMIKACINTROU, AMIKACIN in the last 72 hours.   Microbiology: Recent Results (from the past 720 hour(s))  Urine culture     Status: None   Collection Time: 08/14/15 12:57 AM  Result Value Ref Range Status   Specimen Description URINE, CLEAN CATCH  Final   Special Requests NONE  Final   Culture MULTIPLE SPECIES PRESENT, SUGGEST RECOLLECTION  Final   Report Status 08/15/2015 FINAL  Final  Culture, blood (routine x 2)     Status: None   Collection Time: 08/14/15  1:20 AM  Result Value Ref Range Status   Specimen Description BLOOD RIGHT ARM  Final   Special Requests BOTTLES DRAWN AEROBIC AND ANAEROBIC 10CC  Final   Culture NO GROWTH 5 DAYS  Final   Report Status 08/19/2015 FINAL  Final  Culture, blood (routine x 2)     Status: None   Collection Time: 08/14/15  1:46 AM  Result Value Ref Range Status   Specimen Description BLOOD RIGHT HAND  Final   Special Requests BOTTLES DRAWN AEROBIC ONLY 3CC  Final   Culture NO  GROWTH 5 DAYS  Final   Report Status 08/19/2015 FINAL  Final  MRSA PCR Screening     Status: None   Collection Time: 08/14/15  4:30 AM  Result Value Ref Range Status   MRSA by PCR NEGATIVE NEGATIVE Final    Comment:        The GeneXpert MRSA Assay (FDA approved for NASAL specimens only), is one component of a comprehensive MRSA colonization surveillance program. It is not intended to diagnose MRSA infection nor to guide or monitor treatment for MRSA infections.     Medical History: Past Medical History  Diagnosis Date  . Coronary artery disease     Nonobstructive  . GERD (gastroesophageal reflux disease)   . High cholesterol   . DVT (deep venous thrombosis), right 2005    "right calf after 8 foot fall"  . Chronic bronchitis     "get it basically 3 times/yr"  . Blood transfusion 1960    "related to hysterectomy"  . H/O hiatal hernia   . Concussion w/o coma 1/61/0960    Complicated by subarachnoid hemorrhage.  "even now has times when she's not able to comprehend" (05/08/12)  . Chronic atrial fibrillation     Anticoagulated with warfarin, rate control with diltiazem and Toprol  . 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 Diverticulitis   . Diverticulosis   . External hemorrhoids   . Fatty liver   . AAA (abdominal aortic  aneurysm)   . Edema of both legs     Chronic, thought to be secondary to DVTs  . Family history of adverse reaction to anesthesia 2012    daughter "had OR for crushed hand; had problems w/anesthesia & I was in there all day long" (  . Pneumonia ~ 2010;  2013; 07/2015  . Migraine     "rare now" (08/28/2015)  . Chronic headache     "probably 2/wk" (08/28/2015)  . Rheumatoid arthritis(714.0)     "hands" (08/28/2015)  . Chronic back pain   . Anxiety   . Urinary frequency   . Frequent UTI     Medications:  Prescriptions prior to admission  Medication Sig Dispense Refill Last Dose  . acetaminophen  (TYLENOL) 500 MG tablet Take 1,000 mg by mouth every 6 (six) hours as needed for pain.   Taking  . calcium carbonate (OS-CAL - DOSED IN MG OF ELEMENTAL CALCIUM) 1250 MG tablet Take 1 tablet by mouth every morning.    Taking  . cholecalciferol (VITAMIN D) 1000 UNITS tablet Take 1,000 Units by mouth daily.   Taking  . Cinnamon 500 MG capsule Take 1,000 mg by mouth daily.   Taking  . diltiazem (CARDIZEM CD) 240 MG 24 hr capsule Take 240 mg by mouth daily.   Taking  . doxycycline (VIBRA-TABS) 100 MG tablet Take 1 tablet (100 mg total) by mouth every 12 (twelve) hours. 10 tablet 0 Taking  . furosemide (LASIX) 40 MG tablet Take 40-80 mg by mouth daily. 2 (80mg ) tablets on mon,wed and Friday. All other days 1 tablet (40mg )   Taking  . metoprolol (TOPROL-XL) 50 MG 24 hr tablet Take 50 mg by mouth at bedtime.    Taking  . Multiple Vitamins-Minerals (MULTIVITAMINS THER. W/MINERALS) TABS Take 1 tablet by mouth daily.    Taking  . Omega-3 Fatty Acids (FISH OIL) 1000 MG CAPS Take 1,000 mg by mouth daily.   Taking  . omeprazole (PRILOSEC) 20 MG capsule Take 20 mg by mouth 2 (two) times daily.   Taking  . psyllium (METAMUCIL) 58.6 % powder Take 1 packet by mouth 2 (two) times daily.    Taking  . rosuvastatin (CRESTOR) 5 MG tablet Take 5 mg by mouth every evening.    Taking  . saccharomyces boulardii (FLORASTOR) 250 MG capsule Take 1 capsule (250 mg total) by mouth 2 (two) times daily. 30 capsule 0 Taking  . spironolactone (ALDACTONE) 25 MG tablet Take 25 mg by mouth every morning.   Taking  . warfarin (COUMADIN) 5 MG tablet Take 5-7.5 mg by mouth daily. Take 1.5  Tablet (7.5) on mon,wed,friday. All other days take 1 tablet (5mg )   Taking   Assessment: 81 y/o female with recent admission (9/1-9/5) for sepsis due to cellulitis readmitted today. She was discharged on doxycycline for 5 days to complete 10 days of antibiotics. Cultures were negative last admit. Pharmacy consulted to resume vancomycin and aztreonam for  cellulitis. WBC are elevated at 14.9, she is afebrile, and renal function is normal.   azith 9/1>>9/4 aztreonam 9/1>>9/4 vanc 9/1>>9/4  Goal of Therapy:  Vancomycin trough level 10-15 mcg/ml  Plan:  - Vancomycin 1000 mg IV once then 750 mg IV q24h - Aztreonam 1 g IV q8h - Monitor renal function, clinical progress, and culture data  [facility], Magdalena.D., BCPS Clinical Pharmacist Pager: [phone] 08/28/2015 3:58 PM

## 2015-08-28 NOTE — Patient Outreach (Signed)
08/28/15- Telephone call to patient for transition of care week 2, no answer to home phone, left voicemail requesting return phone call,  Telephone call to cell phone, no answer.   RN CM notified pt admitted to [facility], RN CM sent In Basket to [place] and [facility]), informing inpatient admission.  Jacqlyn Larsen [facility], [place] Coordinator [phone]

## 2015-08-29 ENCOUNTER — Inpatient Hospital Stay (HOSPITAL_COMMUNITY): Payer: PPO

## 2015-08-29 ENCOUNTER — Encounter: Payer: Self-pay | Admitting: *Deleted

## 2015-08-29 DIAGNOSIS — R609 Edema, unspecified: Secondary | ICD-10-CM

## 2015-08-29 LAB — CBC
HEMATOCRIT: 37.2 % (ref 36.0–46.0)
HEMOGLOBIN: 12.3 g/dL (ref 12.0–15.0)
MCH: 32.7 pg (ref 26.0–34.0)
MCHC: 33.1 g/dL (ref 30.0–36.0)
MCV: 98.9 fL (ref 78.0–100.0)
Platelets: 231 10*3/uL (ref 150–400)
RBC: 3.76 MIL/uL — AB (ref 3.87–5.11)
RDW: 13.1 % (ref 11.5–15.5)
WBC: 7 10*3/uL (ref 4.0–10.5)

## 2015-08-29 LAB — PROTIME-INR
INR: 2.31 — AB (ref 0.00–1.49)
Prothrombin Time: 25.1 seconds — ABNORMAL HIGH (ref 11.6–15.2)

## 2015-08-29 MED ORDER — WARFARIN SODIUM 5 MG PO TABS
5.0000 mg | ORAL_TABLET | Freq: Once | ORAL | Status: AC
  Administered 2015-08-29: 5 mg via ORAL
  Filled 2015-08-29: qty 1

## 2015-08-29 MED ORDER — WARFARIN - PHARMACIST DOSING INPATIENT
Freq: Every day | Status: DC
  Administered 2015-08-29 – 2015-09-02 (×5)

## 2015-08-29 MED ORDER — TRAMADOL HCL 50 MG PO TABS
50.0000 mg | ORAL_TABLET | Freq: Four times a day (QID) | ORAL | Status: DC | PRN
  Administered 2015-08-29 – 2015-09-02 (×2): 50 mg via ORAL
  Filled 2015-08-29 (×2): qty 1

## 2015-08-29 NOTE — Progress Notes (Signed)
ANTICOAGULATION CONSULT NOTE - Follow Up Consult  Pharmacy Consult for Warfarin Indication: atrial fibrillation/DVT  Allergies  Allergen Reactions  . Contrast Media [Iodinated Diagnostic Agents] Hives  . Levaquin [Levofloxacin] Itching and Rash  . Penicillins Rash    "haven't had it in years"  . Vancomycin Itching and Rash    Patient Measurements: Height: 5' 4.5" (163.8 cm) Weight: 167 lb 8.8 oz (76 kg) IBW/kg (Calculated) : 55.85  Vital Signs: Temp: 98.2 F (36.8 C) (09/16 0929) Temp Source: Oral (09/16 0929) BP: 118/41 mmHg (09/16 0929) Pulse Rate: 87 (09/16 0929)  Labs:  Recent Labs  08/28/15 1550 08/29/15 0825  HGB 11.2*  --   HCT 33.9*  --   PLT 236  --   APTT 53*  --   LABPROT 30.7* 25.1*  INR 3.01* 2.31*  CREATININE 0.92  --     Estimated Creatinine Clearance: 40.2 mL/min (by C-G formula based on Cr of 0.92).   Assessment: 24 YOF who presented on 9/15 with recurrent cellulitis. The patient was on warfarin PTA for hx Afib/DVT and pharmacy was consulted to resume dosing this admission.   The patient's INR is therapeutic today (INR 2.31 << 3.01, goal of 2-3). The patient missed her dose on 9/15. CBC okay - no overt s/sx of bleeding noted. The patient was re-educated on warfarin today.   PTA dose of 5 mg daily EXCEPT for 2.5 mg on Tues/Sat  Goal of Therapy:  INR 2-3   Plan:  1. Warfarin 5 mg x 1 dose at 1800 today 2. Will continue to monitor for any signs/symptoms of bleeding and will follow up with PT/INR in the a.m.   Alycia Rossetti, PharmD, BCPS Clinical Pharmacist Pager: [phone] 08/29/2015 11:19 AM

## 2015-08-29 NOTE — Progress Notes (Signed)
[facility]  Patient Status: Active (receiving services up to time of hospitalization)  [facility] is providing the following services: PT  If patient discharges after hours, please call [phone].   [name] 08/29/2015, 5:18 PM

## 2015-08-29 NOTE — Care Management Note (Signed)
Case Management Note  Patient Details  Name: [name] MRN: [id number] Date of Birth: [birth year]  Subjective/Objective:          CM following for progression and d/c planning.          Action/Plan: 08/29/2015 Notified by staff that pt MD plans to d/c pt to home with IV antibiotics. However this CM has received no orders or consults and upon review of noted no plan to d/c on IV antibiotics. Will assist with any needs when notified. Pt is active with [facility] who also states that the pt is active with [facility].  [place] notified of pt admission.   Expected Discharge Date:      09/02/2015            Expected Discharge Plan:  [place]  In-House Referral:  Clinical Social Work  Discharge planning Services  CM Consult  Post Acute Care Choice:    Choice offered to:     DME Arranged:    DME Agency:     HH Arranged:    [place] Agency:     Status of Service:  In process, will continue to follow  Medicare Important Message Given:    Date Medicare IM Given:    Medicare IM give by:    Date Additional Medicare IM Given:    Additional Medicare Important Message give by:     If discussed at [place] of Stay Meetings, dates discussed:    Additional Comments:  Adron Bene, RN 08/29/2015, 3:52 PM

## 2015-08-29 NOTE — Progress Notes (Signed)
ANTICOAGULATION CONSULT NOTE - Initial Consult  Pharmacy Consult for Coumadin Indication: atrial fibrillation  Allergies  Allergen Reactions  . Contrast Media [Iodinated Diagnostic Agents] Hives  . Levaquin [Levofloxacin] Itching and Rash  . Penicillins Rash    "haven't had it in years"  . Vancomycin Itching and Rash    Patient Measurements: Height: 5' 4.5" (163.8 cm) Weight: 167 lb 8.8 oz (76 kg) IBW/kg (Calculated) : 55.85  Vital Signs: Temp: 98 F (36.7 C) (09/16 0555) Temp Source: Oral (09/16 0555) BP: 118/42 mmHg (09/16 0555) Pulse Rate: 78 (09/16 0555)  Labs:  Recent Labs  08/28/15 1550  HGB 11.2*  HCT 33.9*  PLT 236  APTT 53*  LABPROT 30.7*  INR 3.01*  CREATININE 0.92    Estimated Creatinine Clearance: 40.2 mL/min (by C-G formula based on Cr of 0.92).  Medications:  Prescriptions prior to admission  Medication Sig Dispense Refill Last Dose  . acetaminophen (TYLENOL) 500 MG tablet Take 1,000 mg by mouth every 6 (six) hours as needed for pain.   Past Week at Unknown time  . calcium carbonate (OS-CAL - DOSED IN MG OF ELEMENTAL CALCIUM) 1250 MG tablet Take 1 tablet by mouth every morning.    08/28/2015 at Unknown time  . cholecalciferol (VITAMIN D) 1000 UNITS tablet Take 1,000 Units by mouth daily.   08/28/2015 at Unknown time  . Cinnamon 500 MG capsule Take 1,000 mg by mouth daily.   08/28/2015 at Unknown time  . diltiazem (CARDIZEM CD) 240 MG 24 hr capsule Take 240 mg by mouth daily.   08/28/2015 at Unknown time  . doxycycline (VIBRA-TABS) 100 MG tablet Take 1 tablet (100 mg total) by mouth every 12 (twelve) hours. 10 tablet 0 Past Week at Unknown time  . furosemide (LASIX) 40 MG tablet Take 40-80 mg by mouth daily. 2 (80mg ) tablets on mon,wed and Friday. All other days 1 tablet (40mg )   08/28/2015 at Unknown time  . metoprolol (TOPROL-XL) 50 MG 24 hr tablet Take 50 mg by mouth at bedtime.    08/27/2015 at 2000  . Multiple Vitamins-Minerals (MULTIVITAMINS THER.  W/MINERALS) TABS Take 1 tablet by mouth daily.    08/28/2015 at Unknown time  . Omega-3 Fatty Acids (FISH OIL) 1000 MG CAPS Take 1,000 mg by mouth daily.   08/28/2015 at Unknown time  . omeprazole (PRILOSEC) 20 MG capsule Take 20 mg by mouth 2 (two) times daily.   08/28/2015 at Unknown time  . psyllium (METAMUCIL) 58.6 % powder Take 1 packet by mouth 2 (two) times daily.    08/28/2015 at Unknown time  . rosuvastatin (CRESTOR) 5 MG tablet Take 5 mg by mouth every evening.    08/27/2015 at Unknown time  . saccharomyces boulardii (FLORASTOR) 250 MG capsule Take 1 capsule (250 mg total) by mouth 2 (two) times daily. 30 capsule 0 08/28/2015 at Unknown time  . spironolactone (ALDACTONE) 25 MG tablet Take 25 mg by mouth every morning.   08/28/2015 at Unknown time  . warfarin (COUMADIN) 5 MG tablet Take 5-7.5 mg by mouth daily. Take 1.5  Tablet (7.5) on mon,wed,friday. All other days take 1 tablet (5mg )   08/27/2015 at Unknown time    Assessment: 81 y.o. female admitted with LLE cellulitis, h/o Afib, to continue Coumadin Goal of Therapy:  INR 2-3 Monitor platelets by anticoagulation protocol: Yes   Plan:  F/U daily INR  A[place]ott, Bronson Curb 08/29/2015,7:11 AM

## 2015-08-29 NOTE — Progress Notes (Addendum)
Subjective: Recurrent cellulitis:  Pt is doing slightly better this am. Denies fever, chills.   Edema:  pts leg is slighlty less swollen.  [place] pending.  Hx of afib:  Pt is on coumadin,  No bleeding.  Pharmacy to dose Hypertension:  Denies cp, palp, sob, + bil edema improved.   Objective: Vital signs in last 24 hours: Temp:  [98 F (36.7 C)-98.4 F (36.9 C)] 98 F (36.7 C) (09/16 0555) Pulse Rate:  [77-85] 78 (09/16 0555) Resp:  [16-20] 18 (09/16 0555) BP: (107-126)/(42-62) 118/42 mmHg (09/16 0555) SpO2:  [97 %-98 %] 97 % (09/16 0555) Weight:  [75.1 kg (165 lb 9.1 oz)-76 kg (167 lb 8.8 oz)] 76 kg (167 lb 8.8 oz) (09/16 0555) Weight change:  Last BM Date: 08/27/15  Intake/Output from previous day: 09/15 0701 - 09/16 0700 In: 240 [P.O.:240] Out: 1400 [Urine:1400] Intake/Output this shift:    Heent: anicteric Neck: no jvd Heart: rrr s1, s2 Lung: ctab Abd: soft Ext: 1+ edema, redness extending from left ankle to knee.   Lab Results:  Recent Labs  08/28/15 1550  WBC 14.9*  HGB 11.2*  HCT 33.9*  PLT 236   BMET  Recent Labs  08/28/15 1550  NA 130*  K 3.8  CL 98*  CO2 25  GLUCOSE 151*  BUN 10  CREATININE 0.92  CALCIUM 8.2*    Studies/Results: No results found.  Medications: I have reviewed the patient's current medications.  Assessment/Plan: Recurrent cellulitis Cont vanco, aztreonam  Leukocytosis Check cbc tomorrow  Anemia Check cbc tomorrow  Hyponatremia likely from lasix Check cmp in am Switch back to oral lasix  Pafib Cont coumadin  DVT prophylaxis: on coumadin   LOS: 1 day   [name] 08/29/2015, 7:44 AM  Please call [phone] if have any questions, any time

## 2015-08-29 NOTE — Consult Note (Signed)
   [facility] CM Inpatient Consult   08/29/2015  [name] Dec 08, 1921 [id number]   Made aware by [facility] RNCM of patient's admission. Patient was recently picked up for [place] Management services after last hospital discharge. Please see chart review tab then notes in EPIC to see [facility] details. Patient's daughter, [name], is primary contact. [name] was not at bedside during this hospital bedside visit. Spoke with patient and family member at bedside to make aware that [facility] will continue to follow for post hospital discharge. Noted that patient's daughter [name] declined [facility] home visits at the time but agreed to post transition of care calls. Patient is also active with [facility]. Made inpatient RNCM aware that [facility] is following. Will continue to follow.  Marthenia Rolling, MSN-Ed, RN,BSN [facility] Liaison [phone]

## 2015-08-29 NOTE — Progress Notes (Signed)
*  PRELIMINARY RESULTS* Vascular Ultrasound Left lower extremity venous duplex has been completed.  Preliminary findings: negative for DVT.  Landry Mellow, RDMS, RVT  08/29/2015, 8:54 AM

## 2015-08-29 NOTE — H&P (Addendum)
[name] is an 81 y.o. female.   Chief Complaint: cellulitis HPI: 81 yo female with hx of cellulitis recently discharged on doxycycline, apparently finished abx 2 days ago, and has c/o increasing redness of the left distal lower extremity as well as swelling.  Pt denies fever, chills.  Pt denies cp, palp, sob, n/v, diarrhea, brbpr, black stool.  Pt was seen in office and sent for direct admission for recurrent cellulitis.   Past Medical History  Diagnosis Date  . Coronary artery disease     Nonobstructive  . GERD (gastroesophageal reflux disease)   . High cholesterol   . DVT (deep venous thrombosis), right 2005    "right calf after 8 foot fall"  . Chronic bronchitis     "get it basically 3 times/yr"  . Blood transfusion 1960    "related to hysterectomy"  . H/O hiatal hernia   . Concussion w/o coma 4/46/2863    Complicated by subarachnoid hemorrhage.  "even now has times when she's not able to comprehend" (05/08/12)  . Chronic atrial fibrillation     Anticoagulated with warfarin, rate control with diltiazem and Toprol  . 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 Diverticulitis   . Diverticulosis   . External hemorrhoids   . Fatty liver   . AAA (abdominal aortic aneurysm)   . Edema of both legs     Chronic, thought to be secondary to DVTs  . Family history of adverse reaction to anesthesia 2012    daughter "had OR for crushed hand; had problems w/anesthesia & I was in there all day long" (  . Pneumonia ~ 2010;  2013; 07/2015  . Migraine     "rare now" (08/28/2015)  . Chronic headache     "probably 2/wk" (08/28/2015)  . Rheumatoid arthritis(714.0)     "hands" (08/28/2015)  . Chronic back pain   . Anxiety   . Urinary frequency   . Frequent UTI     Past Surgical History  Procedure Laterality Date  . Transurethral resection of bladder tumor  11/11/2011    Procedure: TRANSURETHRAL RESECTION OF BLADDER TUMOR (TURBT);  Surgeon:  Malka So;  Location: [facility];  Service: Urology;  Laterality: N/A;  Cysto, Bladder Biopsy, TURBT with Gyrus,   . Cystoscopy  11/11/2011    Procedure: CYSTOSCOPY;  Surgeon: Malka So;  Location: [facility];  Service: Urology;  Laterality: N/A;  . Incontinence surgery  1980's  . Transurethral resection of bladder tumor with gyrus (turbt-gyrus)  2007; 2009; 2010    "for tumors on surface of bladder"  . Knee arthroscopy Right 2005    S/P fall  . Cataract extraction w/ intraocular lens  implant, bilateral Bilateral 1992  . Tonsillectomy  1952  . Cardiac catheterization  1980's  . Total knee arthroplasty Right 02/05/2013    Procedure: TOTAL KNEE ARTHROPLASTY;  Surgeon: Mauri Pole, MD;  Location: [facility];  Service: Orthopedics;  Laterality: Right;  . Cystostomy w/ bladder biopsy  10/08/2013  . Lower extremity venous doppler  11/10/2013    No DVT or superficial thrombus enlarged inguinal lymph node noted in the right. No Baker's cyst.  . Joint replacement    . Breast cyst excision Left 1960's?    2 cysts; benign  . Tumor excision  1976; 1980's    "fatty tumor cut off her upper back; left thumb"  . Vaginal hysterectomy  1960    partial  Family History  Problem Relation Age of Onset  . Anesthesia problems Daughter   . Prostate cancer Brother   . Breast cancer Other     neice  . Heart disease Brother   . Colon cancer Neg Hx    Social History:  reports that she quit smoking about 32 years ago. Her smoking use included Cigarettes. She has a 40 pack-year smoking history. She has quit using smokeless tobacco. Her smokeless tobacco use included Snuff. She reports that she drinks about 4.2 oz of alcohol per week. She reports that she does not use illicit drugs.  Allergies:  Allergies  Allergen Reactions  . Contrast Media [Iodinated Diagnostic Agents] Hives  . Levaquin [Levofloxacin] Itching and Rash  . Penicillins Rash    "haven't had it in years"  . Vancomycin Itching and Rash     Medications Prior to Admission  Medication Sig Dispense Refill  . acetaminophen (TYLENOL) 500 MG tablet Take 1,000 mg by mouth every 6 (six) hours as needed for pain.    . calcium carbonate (OS-CAL - DOSED IN MG OF ELEMENTAL CALCIUM) 1250 MG tablet Take 1 tablet by mouth every morning.     . cholecalciferol (VITAMIN D) 1000 UNITS tablet Take 1,000 Units by mouth daily.    . Cinnamon 500 MG capsule Take 1,000 mg by mouth daily.    Marland Kitchen diltiazem (CARDIZEM CD) 240 MG 24 hr capsule Take 240 mg by mouth daily.    Marland Kitchen doxycycline (VIBRA-TABS) 100 MG tablet Take 1 tablet (100 mg total) by mouth every 12 (twelve) hours. 10 tablet 0  . furosemide (LASIX) 40 MG tablet Take 40-80 mg by mouth daily. 2 (61m) tablets on mon,wed and Friday. All other days 1 tablet (468m    . metoprolol (TOPROL-XL) 50 MG 24 hr tablet Take 50 mg by mouth at bedtime.     . Multiple Vitamins-Minerals (MULTIVITAMINS THER. W/MINERALS) TABS Take 1 tablet by mouth daily.     . Omega-3 Fatty Acids (FISH OIL) 1000 MG CAPS Take 1,000 mg by mouth daily.    . Marland Kitchenmeprazole (PRILOSEC) 20 MG capsule Take 20 mg by mouth 2 (two) times daily.    . psyllium (METAMUCIL) 58.6 % powder Take 1 packet by mouth 2 (two) times daily.     . rosuvastatin (CRESTOR) 5 MG tablet Take 5 mg by mouth every evening.     . saccharomyces boulardii (FLORASTOR) 250 MG capsule Take 1 capsule (250 mg total) by mouth 2 (two) times daily. 30 capsule 0  . spironolactone (ALDACTONE) 25 MG tablet Take 25 mg by mouth every morning.    . warfarin (COUMADIN) 5 MG tablet Take 5-7.5 mg by mouth daily. Take 1.5  Tablet (7.5) on mon,wed,friday. All other days take 1 tablet (68m71m     Results for orders placed or performed during the hospital encounter of 08/28/15 (from the past 48 hour(s))  CBC WITH DIFFERENTIAL     Status: Abnormal   Collection Time: 08/28/15  3:50 PM  Result Value Ref Range   WBC 14.9 (H) 4.0 - 10.5 K/uL   RBC 3.41 (L) 3.87 - 5.11 MIL/uL   Hemoglobin 11.2  (L) 12.0 - 15.0 g/dL   HCT 33.9 (L) 36.0 - 46.0 %   MCV 99.4 78.0 - 100.0 fL   MCH 32.8 26.0 - 34.0 pg   MCHC 33.0 30.0 - 36.0 g/dL   RDW 13.3 11.5 - 15.5 %   Platelets 236 150 - 400 K/uL   Neutrophils Relative %  74 %   Neutro Abs 11.0 (H) 1.7 - 7.7 K/uL   Lymphocytes Relative 15 %   Lymphs Abs 2.3 0.7 - 4.0 K/uL   Monocytes Relative 6 %   Monocytes Absolute 0.9 0.1 - 1.0 K/uL   Eosinophils Relative 5 %   Eosinophils Absolute 0.7 0.0 - 0.7 K/uL   Basophils Relative 0 %   Basophils Absolute 0.0 0.0 - 0.1 K/uL  APTT     Status: Abnormal   Collection Time: 08/28/15  3:50 PM  Result Value Ref Range   aPTT 53 (H) 24 - 37 seconds    Comment:        IF BASELINE aPTT IS ELEVATED, SUGGEST PATIENT RISK ASSESSMENT BE USED TO DETERMINE APPROPRIATE ANTICOAGULANT THERAPY.   Protime-INR     Status: Abnormal   Collection Time: 08/28/15  3:50 PM  Result Value Ref Range   Prothrombin Time 30.7 (H) 11.6 - 15.2 seconds   INR 3.01 (H) 0.00 - 1.49  Comprehensive metabolic panel     Status: Abnormal   Collection Time: 08/28/15  3:50 PM  Result Value Ref Range   Sodium 130 (L) 135 - 145 mmol/L   Potassium 3.8 3.5 - 5.1 mmol/L   Chloride 98 (L) 101 - 111 mmol/L   CO2 25 22 - 32 mmol/L   Glucose, Bld 151 (H) 65 - 99 mg/dL   BUN 10 6 - 20 mg/dL   Creatinine, Ser 0.92 0.44 - 1.00 mg/dL   Calcium 8.2 (L) 8.9 - 10.3 mg/dL   Total Protein 6.0 (L) 6.5 - 8.1 g/dL   Albumin 2.9 (L) 3.5 - 5.0 g/dL   AST 21 15 - 41 U/L   ALT 16 14 - 54 U/L   Alkaline Phosphatase 55 38 - 126 U/L   Total Bilirubin 0.7 0.3 - 1.2 mg/dL   GFR calc non Af Amer 53 (L) >60 mL/min   GFR calc Af Amer >60 >60 mL/min    Comment: (NOTE) The eGFR has been calculated using the CKD EPI equation. This calculation has not been validated in all clinical situations. eGFR's persistently <60 mL/min signify possible Chronic Kidney Disease.    Anion gap 7 5 - 15   No results found.  Review of Systems  Constitutional: Negative.    HENT: Negative.   Eyes: Negative.   Respiratory: Negative.   Cardiovascular: Negative.   Gastrointestinal: Negative.   Genitourinary: Negative.   Musculoskeletal: Negative.   Skin: Positive for rash. Negative for itching.  Neurological: Negative.   Endo/Heme/Allergies: Negative.   Psychiatric/Behavioral: Negative.     Blood pressure 118/42, pulse 78, temperature 98 F (36.7 C), temperature source Oral, resp. rate 18, height 5' 4.5" (1.638 m), weight 76 kg (167 lb 8.8 oz), SpO2 97 %. Physical Exam  Constitutional: She is oriented to person, place, and time. She appears well-developed and well-nourished.  HENT:  Head: Normocephalic and atraumatic.  Eyes: Conjunctivae and EOM are normal. Pupils are equal, round, and reactive to light. No scleral icterus.  Neck: Normal range of motion. Neck supple. No JVD present. No tracheal deviation present. No thyromegaly present.  Cardiovascular: Normal rate and regular rhythm.  Exam reveals no gallop and no friction rub.   No murmur heard. Respiratory: Effort normal and breath sounds normal. No respiratory distress. She has no wheezes. She has no rales. She exhibits no tenderness.  GI: Soft. Bowel sounds are normal. She exhibits no distension and no mass. There is no tenderness. There is no rebound and  no guarding.  Musculoskeletal: She exhibits edema. She exhibits no tenderness.  Lymphadenopathy:    She has no cervical adenopathy.  Neurological: She is alert and oriented to person, place, and time. She has normal reflexes. She displays normal reflexes. No cranial nerve deficit. She exhibits normal muscle tone. Coordination normal.  Skin: Rash noted. There is erythema. No pallor.  + erythema from the left ankle up to the left knee,  Psychiatric: She has a normal mood and affect. Her behavior is normal. Judgment and thought content normal.     Assessment/Plan Cellulitis, recurrent tx with iv vanco, and aztreonam pharmacy to dose Blood culture  x2 Cbc, cmp  Edema Iv lasix Can switch to po tomorrow if improved  Pafib Cont cardizem for rate control  Gerd Cont protonix  DVT prophylaxis: SCD, coumadin per pharmacy appreciate input  [name] 08/29/2015, 7:00 AM   Please call [phone] if any questions, any time, thanks

## 2015-08-30 LAB — CBC
HEMATOCRIT: 35.4 % — AB (ref 36.0–46.0)
Hemoglobin: 11.9 g/dL — ABNORMAL LOW (ref 12.0–15.0)
MCH: 33.1 pg (ref 26.0–34.0)
MCHC: 33.6 g/dL (ref 30.0–36.0)
MCV: 98.6 fL (ref 78.0–100.0)
PLATELETS: 215 10*3/uL (ref 150–400)
RBC: 3.59 MIL/uL — ABNORMAL LOW (ref 3.87–5.11)
RDW: 12.9 % (ref 11.5–15.5)
WBC: 5.8 10*3/uL (ref 4.0–10.5)

## 2015-08-30 LAB — PROTIME-INR
INR: 2.04 — AB (ref 0.00–1.49)
PROTHROMBIN TIME: 22.9 s — AB (ref 11.6–15.2)

## 2015-08-30 LAB — COMPREHENSIVE METABOLIC PANEL
ALBUMIN: 2.9 g/dL — AB (ref 3.5–5.0)
ALT: 19 U/L (ref 14–54)
ANION GAP: 9 (ref 5–15)
AST: 24 U/L (ref 15–41)
Alkaline Phosphatase: 51 U/L (ref 38–126)
BUN: 12 mg/dL (ref 6–20)
CHLORIDE: 100 mmol/L — AB (ref 101–111)
CO2: 26 mmol/L (ref 22–32)
Calcium: 8.6 mg/dL — ABNORMAL LOW (ref 8.9–10.3)
Creatinine, Ser: 0.77 mg/dL (ref 0.44–1.00)
GFR calc Af Amer: >60 mL/min (ref >60)
GFR calc non Af Amer: >60 mL/min (ref >60)
GLUCOSE: 141 mg/dL — AB (ref 65–99)
POTASSIUM: 3.9 mmol/L (ref 3.5–5.1)
SODIUM: 135 mmol/L (ref 135–145)
Total Bilirubin: 0.5 mg/dL (ref 0.3–1.2)
Total Protein: 6.2 g/dL — ABNORMAL LOW (ref 6.5–8.1)

## 2015-08-30 MED ORDER — FUROSEMIDE 40 MG PO TABS: 40.0000 mg | ORAL_TABLET | Freq: Every day | ORAL | Status: DC

## 2015-08-30 MED ORDER — WARFARIN SODIUM 5 MG PO TABS
5.0000 mg | ORAL_TABLET | Freq: Once | ORAL | Status: AC
  Administered 2015-08-30: 5 mg via ORAL
  Filled 2015-08-30: qty 1

## 2015-08-30 NOTE — Progress Notes (Signed)
Subjective: Recurrent cellulitis: Pt is doing slightly better this am. Denies fever, chills.  Black stool: ? hgb stable,  Pt states that has had in the past  ?.  Pt denies heartburn when taking omeprazole. Edema: pts leg is slighlty less swollen. [place] results pending.  Pt is on coumadin . Hx of afib: Pt is on coumadin, No bleeding. Pharmacy to dose Hypertension: Denies cp, palp, sob, + bil edema improved.   Objective: Vital signs in last 24 hours: Temp:  [98 F (36.7 C)-98.5 F (36.9 C)] 98 F (36.7 C) (09/17 0549) Pulse Rate:  [71-87] 71 (09/17 0549) Resp:  [18-20] 19 (09/17 0549) BP: (118-128)/(37-63) 125/37 mmHg (09/17 0549) SpO2:  [95 %-98 %] 95 % (09/17 0549) Weight change:  Last BM Date: 08/27/15  Intake/Output from previous day: 09/16 0701 - 09/17 0700 In: 1620 [P.O.:1520; IV Piggyback:100] Out: 1200 [Urine:1200] Intake/Output this shift: Total I/O In: 560 [P.O.:460; IV Piggyback:100] Out: 600 [Urine:600]  Heent: anicteric Neck: no jvd no bruit Heart: rrr s1, s2 Lung: ctab Abd: soft, nt, nd, +bs Ext: no c/c/ 1+ but improved Skin: redness down some,  Below the left knee about 4 inches   Lab Results:  Recent Labs  08/28/15 1550 08/29/15 1240  WBC 14.9* 7.0  HGB 11.2* 12.3  HCT 33.9* 37.2  PLT 236 231   BMET  Recent Labs  08/28/15 1550  NA 130*  K 3.8  CL 98*  CO2 25  GLUCOSE 151*  BUN 10  CREATININE 0.92  CALCIUM 8.2*    Studies/Results: No results found.  Medications: I have reviewed the patient's current medications.  Assessment/Plan: Recurrent cellulitis Cont vanco, aztreonam  Leukocytosis improved Check cbc tomorrow  Anemia/ ? Black stool Check cbc tomorrow Heme occult stool Cont omeprazole  Hyponatremia likely from lasix Hold lasix this am Check cmp in am   Pafib Cont coumadin  LOS: 2 days   [name] 08/30/2015, 6:44 AM

## 2015-08-30 NOTE — Progress Notes (Signed)
ANTICOAGULATION CONSULT NOTE - Follow Up Consult  Pharmacy Consult for Warfarin Indication: atrial fibrillation/DVT  Allergies  Allergen Reactions  . Contrast Media [Iodinated Diagnostic Agents] Hives  . Levaquin [Levofloxacin] Itching and Rash  . Penicillins Rash    "haven't had it in years"  . Vancomycin Itching and Rash    Patient Measurements: Height: 5' 4.5" (163.8 cm) Weight: 167 lb 8.8 oz (76 kg) IBW/kg (Calculated) : 55.85  Vital Signs: Temp: 98.3 F (36.8 C) (09/17 1015) Temp Source: Oral (09/17 1015) BP: 135/51 mmHg (09/17 1015) Pulse Rate: 87 (09/17 1015)  Labs:  Recent Labs  08/28/15 1550 08/29/15 0825 08/29/15 1240 08/30/15 0644  HGB 11.2*  --  12.3 11.9*  HCT 33.9*  --  37.2 35.4*  PLT 236  --  231 215  APTT 53*  --   --   --   LABPROT 30.7* 25.1*  --  22.9*  INR 3.01* 2.31*  --  2.04*  CREATININE 0.92  --   --  0.77    Estimated Creatinine Clearance: 46.2 mL/min (by C-G formula based on Cr of 0.77).   Assessment: 5 YOF who presented on 9/15 with recurrent cellulitis. The patient was on warfarin PTA for hx Afib/DVT and pharmacy was consulted to resume dosing this admission.   The patient's INR is therapeutic today at 2, but downtrending. The patient missed her dose on 9/15. CBC okay - no overt s/sx of bleeding noted. The patient was re-educated on warfarin.  PTA dose of 5 mg daily EXCEPT for 2.5 mg on Tues/Sat  Goal of Therapy:  INR 2-3   Plan:  1. Warfarin 5 mg x 1  2. Will continue to monitor for any signs/symptoms of bleeding and will follow up with daily PT/INR     Harvel Quale  08/30/2015 2:15 PM

## 2015-08-31 LAB — PROTIME-INR
INR: 1.94 — AB (ref 0.00–1.49)
PROTHROMBIN TIME: 22 s — AB (ref 11.6–15.2)

## 2015-08-31 MED ORDER — POLYETHYLENE GLYCOL 3350 17 G PO PACK
17.0000 g | PACK | Freq: Every day | ORAL | Status: DC | PRN
  Administered 2015-08-31: 17 g via ORAL

## 2015-08-31 MED ORDER — DEXTROSE 5 % IV SOLN
1.0000 g | INTRAVENOUS | Status: DC
  Administered 2015-08-31 – 2015-09-03 (×4): 1 g via INTRAVENOUS
  Filled 2015-08-31 (×4): qty 10

## 2015-08-31 MED ORDER — WARFARIN SODIUM 5 MG PO TABS
5.0000 mg | ORAL_TABLET | Freq: Once | ORAL | Status: AC
  Administered 2015-08-31: 5 mg via ORAL
  Filled 2015-08-31: qty 1

## 2015-08-31 NOTE — Progress Notes (Signed)
Subjective: Cellulitis:  Pt apparently has tolerated cephalosporins in the past, we will try to switch her today to rocephin,  Redness is improving per pt.  No fever.  Edema: pts leg is slighlty less swollen. [place] pending.  Hx of afib: Pt is on coumadin, No bleeding. Pharmacy to dose Black stool: none since initial episode, hgb stable Hypertension: Denies cp, palp, sob, + bil edema improved.   Objective: Vital signs in last 24 hours: Temp:  [98 F (36.7 C)-98.5 F (36.9 C)] 98.1 F (36.7 C) (09/18 0454) Pulse Rate:  [71-87] 79 (09/18 0454) Resp:  [18] 18 (09/18 0454) BP: (117-137)/(43-55) 117/43 mmHg (09/18 0454) SpO2:  [94 %-99 %] 99 % (09/18 0454) Weight:  [74.844 kg (165 lb)] 74.844 kg (165 lb) (09/17 1939) Weight change:  Last BM Date: 08/29/15  Intake/Output from previous day: 09/17 0701 - 09/18 0700 In: 1700 [P.O.:1700] Out: 5401 [Urine:5400; Stool:1] Intake/Output this shift:    Heent: anicteric Neck: no jvd Heart: rrr s1, s1,  Lung: ctab Abd: soft, nt, nd, +bs Ext: no c/c, trace edmea, Skin:  Redness in the left distal lower ext from the ankle to 3/4 up to knee  Lab Results:  Recent Labs  08/29/15 1240 08/30/15 0644  WBC 7.0 5.8  HGB 12.3 11.9*  HCT 37.2 35.4*  PLT 231 215   BMET  Recent Labs  08/28/15 1550 08/30/15 0644  NA 130* 135  K 3.8 3.9  CL 98* 100*  CO2 25 26  GLUCOSE 151* 141*  BUN 10 12  CREATININE 0.92 0.77  CALCIUM 8.2* 8.6*    Studies/Results: No results found.  Medications: I have reviewed the patient's current medications.  Assessment/Plan: Cellulitis D/c azactam Rocephin 1gm iv qday Cont vanco iv pharmacy to dose  Anemia/ ? Black stool Check cbc tomorrow Heme occult stool Cont omeprazole  Hyponatremia likely from lasix Cont to hold lasix Check cmp in am  Pafib Cont coumadin   Dispo: we will consult case management, PT to evaluate and tx for iv abx at SNF/ rehab Possibly picc line on Monday    LOS: 3  days   [name] 08/31/2015, 8:29 AM

## 2015-08-31 NOTE — Progress Notes (Signed)
Pharmacy CONSULT NOTE  Pharmacy Consult for Warfarin, Vancomycin Indication: atrial fibrillation/DVT/ cellulitis  Allergies  Allergen Reactions  . Contrast Media [Iodinated Diagnostic Agents] Hives  . Levaquin [Levofloxacin] Itching and Rash  . Penicillins Rash    "haven't had it in years"  . Vancomycin Itching and Rash    Patient Measurements: Height: 5' 4.5" (163.8 cm) Weight: 165 lb (74.844 kg) IBW/kg (Calculated) : 55.85  Vital Signs: Temp: 98.1 F (36.7 C) (09/18 0454) BP: 117/43 mmHg (09/18 0454) Pulse Rate: 79 (09/18 0454)  Labs:  Recent Labs  08/28/15 1550 08/29/15 0825 08/29/15 1240 08/30/15 0644 08/31/15 0537  HGB 11.2*  --  12.3 11.9*  --   HCT 33.9*  --  37.2 35.4*  --   PLT 236  --  231 215  --   APTT 53*  --   --   --   --   LABPROT 30.7* 25.1*  --  22.9* 22.0*  INR 3.01* 2.31*  --  2.04* 1.94*  CREATININE 0.92  --   --  0.77  --     Estimated Creatinine Clearance: 45.9 mL/min (by C-G formula based on Cr of 0.77).   Assessment: 11 YOF who presented on 9/15 with recurrent cellulitis - was started on vancomycin and aztreonam.  Aztreonam changed to ceftriaxone today.  Renal function has remained stable.   The patient was on warfarin PTA for hx Afib/DVT and pharmacy was consulted to resume dosing this admission.   The patient's INR is almost therapeutic today at 1.94. The patient missed her dose on 9/15. CBC okay - no overt s/sx of bleeding noted. The patient was re-educated on warfarin.  PTA dose of 5 mg daily EXCEPT for 2.5 mg on Tues/Sat  Goal of Therapy:  INR 2-3   Plan:  1. Warfarin 5 mg x 1  2. Will continue to monitor for any signs/symptoms of bleeding and will follow up with daily PT/INR  3. Per Dr. Julianne Rice chart note - change aztreonam to ceftriaxone 1g IV daily 4. Same vancomycin dose today.  Will check trough soon to confirm dose.  Candie Mile  08/31/2015 3:48 PM

## 2015-08-31 NOTE — Evaluation (Signed)
Physical Therapy Evaluation Patient Details Name: ROCKLYN MAYBERRY MRN: [id number] DOB: 04/18/1921 Today's Date: 08/31/2015   History of Present Illness  HPI: 81 yo female with hx of cellulitis recently discharged on doxycycline, apparently finished abx 2 days ago, and has c/o increasing redness of the left distal lower extremity as well as swelling. Pt denies fever, chills. Pt denies cp, palp, sob, n/v, diarrhea, brbpr, black stool. Pt was seen in office and sent for direct admission for recurrent cellulitis.  Clinical Impression   Pt admitted with above diagnosis. Pt currently with functional limitations due to the deficits listed below (see PT Problem List).  Pt will benefit from skilled PT to increase their independence and safety with mobility to allow discharge to the venue listed below.       Follow Up Recommendations Home health PT [facility])  Noted considering SNF for IV antibiotics, and if that skills her, that is fine; from a PT standpoint, home with HHPT follow up is indicated.    Equipment Recommendations  None recommended by PT    Recommendations for Other Services       Precautions / Restrictions Precautions Precautions: Fall Precaution Comments: Fall risk greatly reduced with use of rW      Mobility  Bed Mobility Overal bed mobility: Modified Independent Bed Mobility: Supine to Sit     Supine to sit: Modified independent (Device/Increase time);HOB elevated        Transfers Overall transfer level: Needs assistance Equipment used: Rolling  (2 wheeled) Transfers: Sit to/from Stand Sit to Stand: Supervision         General transfer comment: Cues for safedty and hand placement  Ambulation/Gait Ambulation/Gait assistance: Supervision Ambulation Distance (Feet): 200 Feet Assistive device: Rolling  (2 wheeled) Gait Pattern/deviations: Step-through pattern Gait velocity: approaching WNL with RW   General Gait Details: Cues to self-monitor  for activity tolerance and to push down into RW to [employer] painful LLE  Stairs            Wheelchair Mobility    Modified Rankin (Stroke Patients Only)       Balance                                             Pertinent Vitals/Pain Pain Assessment: Faces Faces Pain Scale: Hurts a little bit Pain Location: LLE in dependent position Pain Descriptors / Indicators: Aching Pain Intervention(s): Limited activity within patient's tolerance    Home Living Family/patient expects to be discharged to:: Private residence Living Arrangements: Children Available Help at Discharge: Family;Available 24 hours/day Type of Home: Mobile home Home Access: Ramped entrance     Home Layout: One level Home Equipment: Clinical cytogeneticist - 2 wheels;Cane - single point;Bedside commode      Prior Function Level of Independence: Independent with assistive device(s)         Comments: cane for amb     Hand Dominance   Dominant Hand: Right    Extremity/Trunk Assessment   Upper Extremity Assessment: Overall WFL for tasks assessed           Lower Extremity Assessment: Overall WFL for tasks assessed (noted erythema and edema LLE)      Cervical / Trunk Assessment: Normal  Communication   Communication: No difficulties  Cognition Arousal/Alertness: Awake/alert Behavior During Therapy: WFL for tasks assessed/performed Overall Cognitive Status: Within Functional Limits for tasks assessed  General Comments      Exercises        Assessment/Plan    PT Assessment Patient needs continued PT services  PT Diagnosis Difficulty walking   PT Problem List Decreased strength;Decreased range of motion;Decreased activity tolerance;Decreased balance;Decreased mobility;Decreased knowledge of use of DME;Decreased knowledge of precautions;Pain  PT Treatment Interventions DME instruction;Gait training;Functional mobility training;Therapeutic  activities;Therapeutic exercise;Balance training;Patient/family education   PT Goals (Current goals can be found in the Care Plan section) Acute Rehab PT Goals Patient Stated Goal: to go home PT Goal Formulation: With patient Time For Goal Achievement: 09/14/15 Potential to Achieve Goals: Good    Frequency Min 3X/week   Barriers to discharge        Co-evaluation               End of Session Equipment Utilized During Treatment: Gait belt Activity Tolerance: Patient tolerated treatment well Patient left: in bed;with call bell/phone within reach (LEs elevated) Nurse Communication: Mobility status         Time: 1536-1600 PT Time Calculation (min) (ACUTE ONLY): 24 min   Charges:   PT Evaluation $Initial PT Evaluation Tier I: 1 Procedure PT Treatments $Gait Training: 8-22 mins   PT G CodesQuin Hoop 08/31/2015, 5:26 PM  Roney Marion, [place] Pager [phone] Office [phone]

## 2015-09-01 LAB — CBC
HCT: 37.3 % (ref 36.0–46.0)
HEMOGLOBIN: 12.3 g/dL (ref 12.0–15.0)
MCH: 32.7 pg (ref 26.0–34.0)
MCHC: 33 g/dL (ref 30.0–36.0)
MCV: 99.2 fL (ref 78.0–100.0)
Platelets: 242 10*3/uL (ref 150–400)
RBC: 3.76 MIL/uL — AB (ref 3.87–5.11)
RDW: 12.9 % (ref 11.5–15.5)
WBC: 4.4 10*3/uL (ref 4.0–10.5)

## 2015-09-01 LAB — COMPREHENSIVE METABOLIC PANEL
ALBUMIN: 2.8 g/dL — AB (ref 3.5–5.0)
ALK PHOS: 56 U/L (ref 38–126)
ALT: 30 U/L (ref 14–54)
ANION GAP: 9 (ref 5–15)
AST: 34 U/L (ref 15–41)
BUN: 10 mg/dL (ref 6–20)
CALCIUM: 9.1 mg/dL (ref 8.9–10.3)
CO2: 26 mmol/L (ref 22–32)
Chloride: 103 mmol/L (ref 101–111)
Creatinine, Ser: 0.74 mg/dL (ref 0.44–1.00)
GFR calc Af Amer: >60 mL/min (ref >60)
GFR calc non Af Amer: >60 mL/min (ref >60)
GLUCOSE: 131 mg/dL — AB (ref 65–99)
Potassium: 4.2 mmol/L (ref 3.5–5.1)
SODIUM: 138 mmol/L (ref 135–145)
Total Bilirubin: 0.6 mg/dL (ref 0.3–1.2)
Total Protein: 6.4 g/dL — ABNORMAL LOW (ref 6.5–8.1)

## 2015-09-01 LAB — PROTIME-INR
INR: 1.91 — AB (ref 0.00–1.49)
PROTHROMBIN TIME: 21.8 s — AB (ref 11.6–15.2)

## 2015-09-01 LAB — VANCOMYCIN, TROUGH: VANCOMYCIN TR: 8 ug/mL — AB (ref 10.0–20.0)

## 2015-09-01 MED ORDER — VANCOMYCIN HCL 10 G IV SOLR
1250.0000 mg | INTRAVENOUS | Status: DC
  Administered 2015-09-02: 1250 mg via INTRAVENOUS
  Filled 2015-09-01 (×2): qty 1250

## 2015-09-01 MED ORDER — MINERAL OIL RE ENEM
1.0000 | ENEMA | Freq: Once | RECTAL | Status: AC
  Administered 2015-09-01: 1 via RECTAL
  Filled 2015-09-01: qty 1

## 2015-09-01 MED ORDER — GLUCERNA SHAKE PO LIQD
237.0000 mL | Freq: Three times a day (TID) | ORAL | Status: DC
  Administered 2015-09-01 – 2015-09-03 (×6): 237 mL via ORAL

## 2015-09-01 MED ORDER — FUROSEMIDE 20 MG PO TABS
20.0000 mg | ORAL_TABLET | Freq: Every day | ORAL | Status: DC
  Administered 2015-09-01 – 2015-09-03 (×3): 20 mg via ORAL
  Filled 2015-09-01 (×3): qty 1

## 2015-09-01 MED ORDER — WARFARIN SODIUM 5 MG PO TABS
5.0000 mg | ORAL_TABLET | Freq: Once | ORAL | Status: AC
  Administered 2015-09-01: 5 mg via ORAL
  Filled 2015-09-01: qty 1

## 2015-09-01 MED ORDER — VANCOMYCIN HCL 500 MG IV SOLR
500.0000 mg | Freq: Once | INTRAVENOUS | Status: AC
  Administered 2015-09-01: 500 mg via INTRAVENOUS
  Filled 2015-09-01 (×2): qty 500

## 2015-09-01 NOTE — Clinical Social Work Note (Signed)
Clinical Social Work Assessment  Patient Details  Name: [name] MRN: [id number] Date of Birth: 1921-01-08  Date of referral:  09/01/15               Reason for consult:  Facility Placement                Permission sought to share information with:  Case Manager, Customer service manager, Family Supports Permission granted to share information::  Yes, Verbal Permission Granted  Name::        Agency::  Wants SNF: [facility]  Relationship::  Daughter: [name] [phone]  Contact Information:     Housing/Transportation Living arrangements for the past 2 months:  [place] of Information:  Adult Children, Medical Team, Case Manager Patient Interpreter Needed:  None Criminal Activity/Legal Involvement Pertinent to Current Situation/Hospitalization:  No - Comment as needed Significant Relationships:  Adult Children, Other Family Members, Community Support Lives with:  Adult Children (daughter [name]) Do you feel safe going back to the place where you live?  No (needs short term IV antibiotics ) Need for family participation in patient care:  Yes (Comment) (POA [name] per request)  Care giving concerns:  LCSW called patient daughter per her request with regards to pending SNF placement for short term IV antibiotics.  Specifically family and MD want [facility].  Call placed to see if [facility] was contracted with [facility] [facility] in which they are not.  Patient lives with daughter in [place].  Patient is active with [facility] as well as home health.   Social Worker assessment / plan:  LCSW has spoken with patient daughter at length with regards to referrals to [facility] and barrier being insurance is not contracted. Daughter reports she cannot go to [facility] as patient's husband died there and she will not be associated with that facility. Daughter is unclear what to do. Asking for MD to call her and make suggestions. LCSW  reviewed other information with regard to referrals in [facility], however Daughter does not know. Referrals will be sent out.  This will all be communicated with MD, thus decision can be finalized with regards to patient's disposition.   FL2 updated and placed on chart for signature. Referrals were sent to [facility] as LCSW discussed with daughter.  Employment status:  Retired Nurse, adult PT Recommendations:  [place] / Referral to community resources:  [place]  Patient/Family's Response to care:  Unclear of what she wants to do. Not agreeable to anything at this time  Patient/Family's Understanding of and Emotional Response to Diagnosis, Current Treatment, and Prognosis:  Daughter reports high stress as she is the sole caregiver of her mother and does not know what to do. She wants to follow MD plan, however being that [facility] does not have a contract she is incapable at this time to making a decision. She is wanting to speak with MD.  Emotional Assessment Appearance:  Appears stated age Attitude/Demeanor/Rapport:  Other (cooperative and pleasant) Affect (typically observed):  Accepting, Adaptable Orientation:  Oriented to Self, Oriented to Place, Oriented to  Time, Oriented to Situation Alcohol / Substance use:  Not Applicable Psych involvement (Current and /or in the community):  No (Comment)  Discharge Needs  Concerns to be addressed:  Financial / Insurance Concerns, Discharge Planning Concerns (family and MD wanting specific SNF, however insurance is not contracted with [facility]) Readmission within the last 30 days:  No Current discharge risk:  None Barriers to Discharge:  Continued Medical Work up, [employer]   Marshell Garfinkel 09/01/2015, 1:44 PM

## 2015-09-01 NOTE — Progress Notes (Signed)
Subjective: Cellulitis: Pt apparently tolerated rocephin Redness is improving per pt. No fever.  Edema: pts leg is slighlty less.  Hx of afib: Pt is on coumadin, No bleeding. Pharmacy to dose Black stool: none since initial episode, hgb stable Hypertension: Denies cp, palp, sob, + bil edema improved.  Constipation:  Pt received miralax yesterday, no bm  Objective: Vital signs in last 24 hours: Temp:  [97.8 F (36.6 C)-98.4 F (36.9 C)] 98 F (36.7 C) (09/19 0510) Pulse Rate:  [54-62] 62 (09/19 0510) Resp:  [18-20] 20 (09/19 0510) BP: (144-157)/(48-72) 144/67 mmHg (09/19 0510) SpO2:  [95 %-99 %] 97 % (09/19 0510) Weight:  [75.2 kg (165 lb 12.6 oz)] 75.2 kg (165 lb 12.6 oz) (09/18 2110) Weight change: 0.356 kg (12.6 oz) Last BM Date: 08/31/15  Intake/Output from previous day: 09/18 0701 - 09/19 0700 In: 720 [P.O.:720] Out: 2300 [Urine:2300] Intake/Output this shift:    Heent: anicteric Neck: no jvd Heart: irr, irr s1, s2 Lung: ctab ABd: soft, nt, nd, +bs Ext: trace edema,  Skin: redness to about 1/2 up to knee   Lab Results:  Recent Labs  08/30/15 0644 09/01/15 0445  WBC 5.8 4.4  HGB 11.9* 12.3  HCT 35.4* 37.3  PLT 215 242   BMET  Recent Labs  08/30/15 0644 09/01/15 0445  NA 135 138  K 3.9 4.2  CL 100* 103  CO2 26 26  GLUCOSE 141* 131*  BUN 12 10  CREATININE 0.77 0.74  CALCIUM 8.6* 9.1    Studies/Results: No results found.  Medications: I have reviewed the patient's current medications.  Assessment/Plan: Recurrent cellulitis Cont vanco, rocephin picc line   Anemia/ ? Black stool Check cbc tomorrow Heme occult stool Cont omeprazole  Hyponatremia resolved Restart on lasix 20mg  po qday Check cmp in am  Pafib cont coumadin  Dispo: [facility] if they have a bed tomorrow for 1 week for iv abx     LOS: 4 days   [name] 09/01/2015, 8:05 AM

## 2015-09-01 NOTE — Progress Notes (Signed)
Patient is c/o feeling constipated. Throughout the day she has had 2 cups of prune juice, a mineral oil enema, and several cups of decaf coffee. Patient states coffee usually helps her have a BM. None of the above have been successful. Will monitor.  Joellen Jersey, RN.

## 2015-09-01 NOTE — Progress Notes (Addendum)
ANTIBIOTIC CONSULT NOTE  Pharmacy Consult for vancomycin Indication: cellulitis  Allergies  Allergen Reactions  . Contrast Media [Iodinated Diagnostic Agents] Hives  . Levaquin [Levofloxacin] Itching and Rash  . Penicillins Rash    "haven't had it in years"  . Vancomycin Itching and Rash    Patient Measurements: Height: 5' 4.5" (163.8 cm) Weight: 165 lb 12.6 oz (75.2 kg) IBW/kg (Calculated) : 55.85   Vital Signs: Temp: 98.2 F (36.8 C) (09/19 1742) Temp Source: Oral (09/19 1742) BP: 128/52 mmHg (09/19 1742) Pulse Rate: 80 (09/19 1742) Intake/Output from previous day: 09/18 0701 - 09/19 0700 In: 720 [P.O.:720] Out: 2300 [Urine:2300] Intake/Output from this shift: Total I/O In: 2330 [P.O.:2280; IV Piggyback:50] Out: 1550 [Urine:1550]  Labs:  Recent Labs  08/30/15 0644 09/01/15 0445  WBC 5.8 4.4  HGB 11.9* 12.3  PLT 215 242  CREATININE 0.77 0.74   Estimated Creatinine Clearance: 46 mL/min (by C-G formula based on Cr of 0.74).  Recent Labs  09/01/15 1714  [place] 8*     Microbiology: Recent Results (from the past 720 hour(s))  Urine culture     Status: None   Collection Time: 08/14/15 12:57 AM  Result Value Ref Range Status   Specimen Description URINE, CLEAN CATCH  Final   Special Requests NONE  Final   Culture MULTIPLE SPECIES PRESENT, SUGGEST RECOLLECTION  Final   Report Status 08/15/2015 FINAL  Final  Culture, blood (routine x 2)     Status: None   Collection Time: 08/14/15  1:20 AM  Result Value Ref Range Status   Specimen Description BLOOD RIGHT ARM  Final   Special Requests BOTTLES DRAWN AEROBIC AND ANAEROBIC 10CC  Final   Culture NO GROWTH 5 DAYS  Final   Report Status 08/19/2015 FINAL  Final  Culture, blood (routine x 2)     Status: None   Collection Time: 08/14/15  1:46 AM  Result Value Ref Range Status   Specimen Description BLOOD RIGHT HAND  Final   Special Requests BOTTLES DRAWN AEROBIC ONLY 3CC  Final   Culture NO GROWTH 5 DAYS   Final   Report Status 08/19/2015 FINAL  Final  MRSA PCR Screening     Status: None   Collection Time: 08/14/15  4:30 AM  Result Value Ref Range Status   MRSA by PCR NEGATIVE NEGATIVE Final    Comment:        The GeneXpert MRSA Assay (FDA approved for NASAL specimens only), is one component of a comprehensive MRSA colonization surveillance program. It is not intended to diagnose MRSA infection nor to guide or monitor treatment for MRSA infections.   Culture, blood (routine x 2)     Status: None (Preliminary result)   Collection Time: 08/28/15  3:50 PM  Result Value Ref Range Status   Specimen Description BLOOD RIGHT ARM  Final   Special Requests BOTTLES DRAWN AEROBIC ONLY 5CC  Final   Culture NO GROWTH 4 DAYS  Final   Report Status PENDING  Incomplete  Culture, blood (routine x 2)     Status: None (Preliminary result)   Collection Time: 08/28/15  4:00 PM  Result Value Ref Range Status   Specimen Description BLOOD RIGHT HAND  Final   Special Requests BOTTLES DRAWN AEROBIC ONLY 4CC  Final   Culture NO GROWTH 4 DAYS  Final   Report Status PENDING  Incomplete    Assessment: 81 y/o female with recent admission (9/1-9/5) for sepsis due to cellulitis. She was discharged on doxycycline  for 5 days to complete 10 days of antibiotics. Cultures were negative last admit.   This admission, she was started on vancomycin and aztreonam, changed to vancomycin + ceftriaxone. Plan is for 1 week of IV antibiotics per Dr. Julianne Rice note.  Vanc 9/15>> Azactam 9/15>>9/18 Ceftriaxone 9/18>>  A vancomycin trough tonight was low at 31mcg/mL. Renal function has improved slightly from admission value of 0.92, now 0.74.    Goal of Therapy:  Vancomycin trough level 10-15 mcg/ml  Plan:  - Vancomycin 1250mg  IV q24h - Ceftriaxone 1g IV q24h - Monitor renal function, clinical progress, repeat trough PRN  Lauren D. Bajbus, PharmD, BCPS Clinical Pharmacist Pager: [phone] 09/01/2015 6:32 PM

## 2015-09-01 NOTE — Consult Note (Addendum)
   [facility] Inpatient Consult   09/01/2015  [name] [birth year] [id number]   [facility] Care Management follow up again with patient today at bedside. [name] indicates she plans on going to SNF in [place] for a week for IV antibiotics. Spoke with both inpatient RNCM and inpatient Licensed CSW about this. [name], patient's daughter not at bedside. [name] gave permission for [name] to be called at [phone]. Left voicemail for [name] to call back. Writer attempting to obtain written consent for ongoing [facility] Care Management follow up.   Marthenia Rolling, MSN-Ed, RN,BSN [facility] Liaison [phone]

## 2015-09-01 NOTE — Progress Notes (Signed)
ANTICOAGULATION CONSULT NOTE - Follow Up Consult  Pharmacy Consult for Warfarin Indication: atrial fibrillation/DVT  Allergies  Allergen Reactions  . Contrast Media [Iodinated Diagnostic Agents] Hives  . Levaquin [Levofloxacin] Itching and Rash  . Penicillins Rash    "haven't had it in years"  . Vancomycin Itching and Rash    Patient Measurements: Height: 5' 4.5" (163.8 cm) Weight: 165 lb 12.6 oz (75.2 kg) IBW/kg (Calculated) : 55.85  Vital Signs: Temp: 98.2 F (36.8 C) (09/19 1000) Temp Source: Oral (09/19 1000) BP: 130/69 mmHg (09/19 1000) Pulse Rate: 84 (09/19 1000)  Labs:  Recent Labs  08/30/15 0644 08/31/15 0537 09/01/15 0445  HGB 11.9*  --  12.3  HCT 35.4*  --  37.3  PLT 215  --  242  LABPROT 22.9* 22.0* 21.8*  INR 2.04* 1.94* 1.91*  CREATININE 0.77  --  0.74    Estimated Creatinine Clearance: 46 mL/min (by C-G formula based on Cr of 0.74).   Assessment: 52 YOF who presented on 9/15 with recurrent cellulitis. The patient was on warfarin PTA for hx Afib/DVT and pharmacy was consulted to resume dosing this admission.   The patient's INR is subtherapeutic today at 1.91. The patient missed her dose on 9/15. CBC okay - no overt s/sx of bleeding noted. The patient was re-educated on warfarin.  PTA dose of 5 mg daily EXCEPT for 2.5 mg on Tues/Sat  Goal of Therapy:  INR 2-3   Plan:  1. Warfarin 5 mg PO x 1  2. Will continue to monitor for any signs/symptoms of bleeding and will follow up with daily PT/INR   [facility], Halley.D., BCPS Clinical Pharmacist Pager: [phone] 09/01/2015 12:59 PM

## 2015-09-01 NOTE — Care Management Important Message (Signed)
Important Message  Patient Details  Name: [name] MRN: [id number] Date of Birth: October 22, 1921   Medicare Important Message Given:  Yes-second notification given    Delorse Lek 09/01/2015, 10:05 AM

## 2015-09-02 ENCOUNTER — Inpatient Hospital Stay (HOSPITAL_COMMUNITY): Payer: PPO

## 2015-09-02 LAB — CBC
HEMATOCRIT: 37.4 % (ref 36.0–46.0)
Hemoglobin: 13 g/dL (ref 12.0–15.0)
MCH: 33.9 pg (ref 26.0–34.0)
MCHC: 34.8 g/dL (ref 30.0–36.0)
MCV: 97.7 fL (ref 78.0–100.0)
PLATELETS: 223 10*3/uL (ref 150–400)
RBC: 3.83 MIL/uL — ABNORMAL LOW (ref 3.87–5.11)
RDW: 12.8 % (ref 11.5–15.5)
WBC: 4.8 10*3/uL (ref 4.0–10.5)

## 2015-09-02 LAB — COMPREHENSIVE METABOLIC PANEL
ALBUMIN: 3.1 g/dL — AB (ref 3.5–5.0)
ALT: 33 U/L (ref 14–54)
ANION GAP: 8 (ref 5–15)
AST: 33 U/L (ref 15–41)
Alkaline Phosphatase: 57 U/L (ref 38–126)
BILIRUBIN TOTAL: 0.4 mg/dL (ref 0.3–1.2)
BUN: 12 mg/dL (ref 6–20)
CHLORIDE: 100 mmol/L — AB (ref 101–111)
CO2: 30 mmol/L (ref 22–32)
Calcium: 9.7 mg/dL (ref 8.9–10.3)
Creatinine, Ser: 0.76 mg/dL (ref 0.44–1.00)
GFR calc Af Amer: >60 mL/min (ref >60)
GFR calc non Af Amer: >60 mL/min (ref >60)
GLUCOSE: 132 mg/dL — AB (ref 65–99)
POTASSIUM: 4.7 mmol/L (ref 3.5–5.1)
SODIUM: 138 mmol/L (ref 135–145)
TOTAL PROTEIN: 6.4 g/dL — AB (ref 6.5–8.1)

## 2015-09-02 LAB — CULTURE, BLOOD (ROUTINE X 2)
Culture: NO GROWTH
Culture: NO GROWTH

## 2015-09-02 LAB — PROTIME-INR
INR: 1.82 — AB (ref 0.00–1.49)
PROTHROMBIN TIME: 21.1 s — AB (ref 11.6–15.2)

## 2015-09-02 MED ORDER — DEXTROSE 5 % IV SOLN: 1.0000 g | INTRAVENOUS | Status: DC

## 2015-09-02 MED ORDER — VANCOMYCIN HCL 10 G IV SOLR: 1250.0000 mg | INTRAVENOUS | Status: DC

## 2015-09-02 MED ORDER — FUROSEMIDE 20 MG PO TABS: 20.0000 mg | ORAL_TABLET | Freq: Every day | ORAL | Status: DC

## 2015-09-02 MED ORDER — WARFARIN SODIUM 7.5 MG PO TABS
7.5000 mg | ORAL_TABLET | Freq: Once | ORAL | Status: AC
Start: 2015-09-02 — End: 2015-09-02
  Administered 2015-09-02: 7.5 mg via ORAL
  Filled 2015-09-02: qty 1

## 2015-09-02 MED ORDER — DILTIAZEM HCL ER COATED BEADS 240 MG PO CP24: 240.0000 mg | ORAL_CAPSULE | Freq: Every day | ORAL | Status: DC

## 2015-09-02 MED ORDER — SODIUM CHLORIDE 0.9 % IJ SOLN
10.0000 mL | INTRAMUSCULAR | Status: DC | PRN
  Administered 2015-09-03: 10 mL
  Filled 2015-09-02: qty 40

## 2015-09-02 MED ORDER — GLUCERNA SHAKE PO LIQD: 237.0000 mL | Freq: Three times a day (TID) | ORAL | Status: DC

## 2015-09-02 MED ORDER — POLYETHYLENE GLYCOL 3350 17 G PO PACK: 17.0000 g | PACK | Freq: Every day | ORAL | Status: DC | PRN

## 2015-09-02 NOTE — Progress Notes (Signed)
ANTICOAGULATION CONSULT NOTE - Follow Up Consult  Pharmacy Consult for Warfarin Indication: atrial fibrillation/DVT  Allergies  Allergen Reactions  . Contrast Media [Iodinated Diagnostic Agents] Hives  . Levaquin [Levofloxacin] Itching and Rash  . Penicillins Rash    "haven't had it in years"  . Vancomycin Itching and Rash    Patient Measurements: Height: 5' 4.5" (163.8 cm) Weight: 164 lb 3.2 oz (74.481 kg) IBW/kg (Calculated) : 55.85  Vital Signs: Temp: 97.7 F (36.5 C) (09/20 0803) Temp Source: Oral (09/20 0803) BP: 125/50 mmHg (09/20 0803) Pulse Rate: 80 (09/20 0803)  Labs:  Recent Labs  08/31/15 0537 09/01/15 0445 09/02/15 0605 09/02/15 0825  HGB  --  12.3 13.0  --   HCT  --  37.3 37.4  --   PLT  --  242 223  --   LABPROT 22.0* 21.8* 21.1*  --   INR 1.94* 1.91* 1.82*  --   CREATININE  --  0.74  --  0.76    Estimated Creatinine Clearance: 45.8 mL/min (by C-G formula based on Cr of 0.76).   Assessment: 60 YOF who presented on 9/15 with recurrent cellulitis. The patient was on warfarin PTA for hx Afib/DVT and pharmacy was consulted to resume dosing this admission.   The patient's INR is subtherapeutic today at 1.82. The patient missed her dose on 9/15. CBC okay - no overt s/sx of bleeding noted. The patient was re-educated on warfarin.  PTA dose of 5 mg daily EXCEPT for 2.5 mg on Tues/Sat  Goal of Therapy:  INR 2-3   Plan:  1. Warfarin 7.5 mg PO x 1  2. Will continue to monitor for any signs/symptoms of bleeding and will follow up with daily PT/INR  3. Likely d/c today  [facility], Pharm.D., BCPS Clinical Pharmacist Pager: [phone] 09/02/2015 1:39 PM

## 2015-09-02 NOTE — Discharge Instructions (Signed)
Please see SNF physician in 2-3 days for f/u of cellulitis and edema

## 2015-09-02 NOTE — Progress Notes (Signed)
Peripherally Inserted Central Catheter/Midline Placement  The IV Nurse has discussed with the patient and/or persons authorized to consent for the patient, the purpose of this procedure and the potential benefits and risks involved with this procedure.  The benefits include less needle sticks, lab draws from the catheter and patient may be discharged home with the catheter.  Risks include, but not limited to, infection, bleeding, blood clot (thrombus formation), and puncture of an artery; nerve damage and irregular heat beat.  Alternatives to this procedure were also discussed.  PICC/Midline Placement Documentation        [name] 09/02/2015, 3:32 PM

## 2015-09-02 NOTE — Consult Note (Signed)
   [facility] CM Inpatient Consult   09/02/2015  [name] [birth year] [id number] Follow up with the patient on written consent for services.  Met with the patient at the bedside.  Daughter was not currently in the room.   Packet/folder with [facility] Care Management and consent left with the patient to be given to her daughter, [name].  Spoke with the inpatient RNCM to update her on [place] Management following for care/disease management for needs.  Currently the disposition remains for a skilled nursing facility.  Of note, [facility] Care Management does not replace or interfere with any services needed for this patient.  For questions, please contact: Natividad Brood, RN BSN [facility] Liaison  [phone] business mobile phone

## 2015-09-02 NOTE — Progress Notes (Signed)
Patient is ready for discharge. Clinical social worker followed up for disposition. Currently disposition pending, but patient is ready for discharge today. Call placed to daughter, [name]. Left message.  Patient has bed offers at [facility], [place], [place]. Awaiting call back from daughter. Will follow and assist with disposition.   [place] Work Intern Preceptor: Crawford Givens [phone]

## 2015-09-02 NOTE — Progress Notes (Signed)
Physical Therapy Treatment Patient Details Name: [name] MRN: [id number] DOB: Oct 09, 1921 Today's Date: 09/02/2015    History of Present Illness HPI: 81 yo female with hx of cellulitis recently discharged on doxycycline, apparently finished abx 2 days ago, and has c/o increasing redness of the left distal lower extremity as well as swelling. Pt denies fever, chills. Pt denies cp, palp, sob, n/v, diarrhea, brbpr, black stool. Pt was seen in office and sent for direct admission for recurrent cellulitis.    PT Comments    Continuing improvements with mobility and activity tolerance; Pt is quite concerned with having a BM (hopefully getting OOB and walking will help that along)  Follow Up Recommendations  Home health PT [facility])  Noted considering SNF for IV antibiotics, and if that skills her, that is of course fine; from a PT standpoint, home with HHPT follow up is indicated.     Equipment Recommendations  None recommended by PT    Recommendations for Other Services       Precautions / Restrictions Precautions Precaution Comments: Fall risk greatly reduced with use of rW    Mobility  Bed Mobility Overal bed mobility: Modified Independent                Transfers Overall transfer level: Modified independent               General transfer comment: Overall managing well with sit<>stand  Ambulation/Gait Ambulation/Gait assistance: Supervision Ambulation Distance (Feet): 200 Feet (greater than) Assistive device: Rolling walker (2 wheeled) Gait Pattern/deviations: Step-through pattern Gait velocity: approaching WNL with RW   General Gait Details: Cues to self-monitor for activity tolerance and to push down into RW to [employer] painful LLE   Stairs            Wheelchair Mobility    Modified Rankin (Stroke Patients Only)       Balance                                    Cognition Arousal/Alertness: Awake/alert Behavior  During Therapy: WFL for tasks assessed/performed Overall Cognitive Status: Within Functional Limits for tasks assessed                      Exercises      General Comments        Pertinent Vitals/Pain Pain Assessment: Faces Faces Pain Scale: Hurts a little bit Pain Location: LLE in dependent position Pain Descriptors / Indicators: Aching Pain Intervention(s): Monitored during session (elevated extremity)    Home Living                      Prior Function            PT Goals (current goals can now be found in the care plan section) Acute Rehab PT Goals Patient Stated Goal: to go home PT Goal Formulation: With patient Time For Goal Achievement: 09/14/15 Potential to Achieve Goals: Good Progress towards PT goals: Progressing toward goals    Frequency  Min 3X/week    PT Plan Current plan remains appropriate    Co-evaluation             End of Session   Activity Tolerance: Patient tolerated treatment well Patient left: in chair;with call bell/phone within reach;with nursing/sitter in room     Time: 6283-1517 PT Time Calculation (min) (ACUTE ONLY): 25 min  Charges:  $  Gait Training: 23-37 mins                    G Codes:      Roney Marion Hamff 09/02/2015, 11:26 AM  Roney Marion, [place] Pager [phone] Office [phone]

## 2015-09-02 NOTE — Discharge Summary (Signed)
Physician Discharge Summary  Patient ID: [name] MRN: [id number] DOB/AGE: [birth year] 81 y.o.  Admit date: 08/28/2015 Discharge date: 09/02/2015  Admission Diagnoses: Cellulitis Edema Hypertension Chronic atrial fibrillation H/o DVT PVD AAA [name]  Discharge Diagnoses:  Active Problems:   Hypertension   GERD (gastroesophageal reflux disease)   Chronic atrial fibrillation   Edema of both legs   Cellulitis   Discharged Condition: stable  Hospital Course: 81 yo female with recent admission for cellulitis and sepsis apparently c/o redness 2 days after finishing course of po doxycycline.  Pt presented to office and transferred to hopsital for direct admission.  Pt initially was treated with vancomycin and also primaxin, and then converted to vancomycin and rocephin as patient had tolerated this in the past.  Pt also had a lower ext ultrasound to LLE swelling which was negative.  Pt appears to be improving and stable to discharge to SNF /rehab to complete 7 more days of abx.    Consults: None  Significant Diagnostic Studies: labs: , L LE ultrasound Sep 28, 2015=> negative  Treatments: antibiotics: vanco, primaxin=> vanco, rocephin starting on 08/31/2015  Discharge Exam: Blood pressure 97/41, pulse 80, temperature 97.9 F (36.6 C), temperature source Oral, resp. rate 18, height 5' 4.5" (1.638 m), weight 74.481 kg (164 lb 3.2 oz), SpO2 99 %. Heent: anicteric Neck: no jvd Heart:irr, irr, s1, s2,  Lung: ctab Abd: soft, nt, nd, +bs Ext: no c/c/e Skin: redness about 1/2 up to left knee from ankle, improving Neuro: nonfocal  Disposition: SNF/rehab  A/P: Cellulitis, recurrent Cont vanco , rocephin for 7 more days  Edema Note that we decreased lasix from home dose due to lower bp May need to increase to 40mg  po qday.  For now continue lasix 20mg  po qday, and spironolactone 25mg  po qday  Hypertension Pt has had some lower bp, so we discontinued her metoprolol and pt  remains on cardizem, and well rate controlled  Chronic afib Cont coumadin at current home dose Check INR in 2 days Check cbc, cmp in 2 days  [name] prilosec 20mg  po qday      Medication List    STOP taking these medications        acetaminophen 500 MG tablet  Commonly known as:  TYLENOL     doxycycline 100 MG tablet  Commonly known as:  VIBRA-TABS     metoprolol succinate 50 MG 24 hr tablet  Commonly known as:  TOPROL-XL      TAKE these medications        calcium carbonate 1250 (500 CA) MG tablet  Commonly known as:  OS-CAL - dosed in mg of elemental calcium  Take 1 tablet by mouth every morning.     cefTRIAXone 1 g in dextrose 5 % 50 mL  Inject 1 g into the vein daily.     cholecalciferol 1000 UNITS tablet  Commonly known as:  VITAMIN D  Take 1,000 Units by mouth daily.     Cinnamon 500 MG capsule  Take 1,000 mg by mouth daily.     diltiazem 240 MG 24 hr capsule  Commonly known as:  CARDIZEM CD  Take 240 mg by mouth daily.     diltiazem 240 MG 24 hr capsule  Commonly known as:  CARDIZEM CD  Take 1 capsule (240 mg total) by mouth daily.     feeding supplement (GLUCERNA SHAKE) Liqd  Take 237 mLs by mouth 3 (three) times daily between meals.     Fish Oil 1000 MG  Caps  Take 1,000 mg by mouth daily.     furosemide 20 MG tablet  Commonly known as:  LASIX  Take 1 tablet (20 mg total) by mouth daily.     multivitamins ther. w/minerals Tabs tablet  Take 1 tablet by mouth daily.     polyethylene glycol packet  Commonly known as:  MIRALAX / GLYCOLAX  Take 17 g by mouth daily as needed for moderate constipation.     PRILOSEC 20 MG capsule  Generic drug:  omeprazole  Take 20 mg by mouth 2 (two) times daily.     psyllium 58.6 % powder  Commonly known as:  METAMUCIL  Take 1 packet by mouth 2 (two) times daily.     saccharomyces boulardii 250 MG capsule  Commonly known as:  FLORASTOR  Take 1 capsule (250 mg total) by mouth 2 (two) times daily.      spironolactone 25 MG tablet  Commonly known as:  ALDACTONE  Take 25 mg by mouth every morning.     vancomycin 1,250 mg in sodium chloride 0.9 % 250 mL  Inject 1,250 mg into the vein daily.     warfarin 5 MG tablet  Commonly known as:  COUMADIN  Take 2.5-5 mg by mouth daily. Takes 1 tablet (5 mg) daily EXCEPT for 0.5 tablet (2.5 mg) on Tuesdays and Saturdays      ASK your doctor about these medications        rosuvastatin 5 MG tablet  Commonly known as:  CRESTOR  Take 5 mg by mouth every evening.       Follow-up Information    Follow up with [name], MD In 2 weeks.   Specialty:  Internal Medicine   Contact information:   [address] [place] [address] [phone]       Signed: [name] 09/02/2015, 7:46 AM

## 2015-09-03 LAB — PROTIME-INR
INR: 1.91 — ABNORMAL HIGH (ref 0.00–1.49)
Prothrombin Time: 21.8 seconds — ABNORMAL HIGH (ref 11.6–15.2)

## 2015-09-03 MED ORDER — HEPARIN SOD (PORK) LOCK FLUSH 100 UNIT/ML IV SOLN
250.0000 [IU] | INTRAVENOUS | Status: AC | PRN
  Administered 2015-09-03: 250 [IU]

## 2015-09-03 MED ORDER — WARFARIN SODIUM 7.5 MG PO TABS
7.5000 mg | ORAL_TABLET | Freq: Once | ORAL | Status: AC
  Administered 2015-09-03: 7.5 mg via ORAL
  Filled 2015-09-03: qty 1

## 2015-09-03 NOTE — Discharge Summary (Signed)
Physician Discharge Summary  Patient ID: [name] MRN: [id number] DOB/AGE: 14-Mar-1921 81 y.o.  Admit date: 08/28/2015 Discharge date: 09/03/2015  Admission Diagnoses: CellulitisCellulitis Edema Hypertension Chronic atrial fibrillation H/o DVT PVD AAA [name]   Discharge Diagnoses:  Active Problems:   Hypertension   GERD (gastroesophageal reflux disease)   Chronic atrial fibrillation   Edema of both legs   Cellulitis   Discharged Condition: stable  Hospital Course: 81 yo female with recent admission for cellulitis and sepsis apparently c/o redness 2 days after finishing course of po doxycycline. Pt presented to office and transferred to hopsital for direct admission. Pt initially was treated with vancomycin and also primaxin, and then converted to vancomycin and rocephin as patient had tolerated this in the past. Pt also had a lower ext ultrasound to LLE swelling which was negative.Pt could not be discharged yesterday due to social work/case management not being able to place patient yesterday.   Pt appears to be improving and stable to discharge to SNF /rehab to complete 7 more days of abx.    Consults: None  Significant Diagnostic Studies: L LE ultrasound 08/29/2015=> negative  Treatments: antibiotics:antibiotics: vanco, primaxin  (08/28/2015-08/30/2015) => vanco, rocephin starting on 08/31/2015  Discharge Exam: Blood pressure 119/58, pulse 79, temperature 98 F (36.7 C), temperature source Oral, resp. rate 16, height 5' 4.5" (1.638 m), weight 75.433 kg (166 lb 4.8 oz), SpO2 96 %. Heent: anicteric Neck: no jvd Heart:irr, irr, s1, s2,  Lung: ctab Abd: soft, nt, nd, +bs Ext: no c/c/e Skin: redness about 1/2 up to left knee from ankle, improving Neuro: nonfocal   A/P: Cellulitis, recurrent Cont vanco , rocephin for 7 more days  Edema Note that we decreased lasix from home dose due to lower bp May need to increase to 40mg  po qday. For now continue lasix  20mg  po qday, and spironolactone 25mg  po qday  Hypertension Pt has had some lower bp, so we discontinued her metoprolol and pt remains on cardizem, and well rate controlled  Chronic afib Cont coumadin at current home dose Check INR in 1- 2 days Check cbc, cmp in 1-2 days  [name] prilosec 20mg  po qday  Disposition: SNF       Medication List    STOP taking these medications        acetaminophen 500 MG tablet  Commonly known as:  TYLENOL     doxycycline 100 MG tablet  Commonly known as:  VIBRA-TABS     metoprolol succinate 50 MG 24 hr tablet  Commonly known as:  TOPROL-XL      TAKE these medications        calcium carbonate 1250 (500 CA) MG tablet  Commonly known as:  OS-CAL - dosed in mg of elemental calcium  Take 1 tablet by mouth every morning.     cefTRIAXone 1 g in dextrose 5 % 50 mL  Inject 1 g into the vein daily.     cholecalciferol 1000 UNITS tablet  Commonly known as:  VITAMIN D  Take 1,000 Units by mouth daily.     Cinnamon 500 MG capsule  Take 1,000 mg by mouth daily.     diltiazem 240 MG 24 hr capsule  Commonly known as:  CARDIZEM CD  Take 240 mg by mouth daily.     diltiazem 240 MG 24 hr capsule  Commonly known as:  CARDIZEM CD  Take 1 capsule (240 mg total) by mouth daily.     feeding supplement (GLUCERNA SHAKE) Liqd  Take 237 mLs by mouth 3 (three) times daily between meals.     Fish Oil 1000 MG Caps  Take 1,000 mg by mouth daily.     furosemide 20 MG tablet  Commonly known as:  LASIX  Take 1 tablet (20 mg total) by mouth daily.     multivitamins ther. w/minerals Tabs tablet  Take 1 tablet by mouth daily.     polyethylene glycol packet  Commonly known as:  MIRALAX / GLYCOLAX  Take 17 g by mouth daily as needed for moderate constipation.     PRILOSEC 20 MG capsule  Generic drug:  omeprazole  Take 20 mg by mouth 2 (two) times daily.     psyllium 58.6 % powder  Commonly known as:  METAMUCIL  Take 1 packet by mouth 2 (two) times  daily.     saccharomyces boulardii 250 MG capsule  Commonly known as:  FLORASTOR  Take 1 capsule (250 mg total) by mouth 2 (two) times daily.     spironolactone 25 MG tablet  Commonly known as:  ALDACTONE  Take 25 mg by mouth every morning.     vancomycin 1,250 mg in sodium chloride 0.9 % 250 mL  Inject 1,250 mg into the vein daily.     warfarin 5 MG tablet  Commonly known as:  COUMADIN  Take 2.5-5 mg by mouth daily. Takes 1 tablet (5 mg) daily EXCEPT for 0.5 tablet (2.5 mg) on Tuesdays and Saturdays      ASK your doctor about these medications        rosuvastatin 5 MG tablet  Commonly known as:  CRESTOR  Take 5 mg by mouth every evening.           Follow-up Information    Follow up with [name], MD In 2 weeks.   Specialty:  Internal Medicine   Contact information:   [address] [place] [address] [phone]       Signed: [name] 09/03/2015, 8:07 AM

## 2015-09-03 NOTE — Clinical Social Work Placement (Signed)
   CLINICAL SOCIAL WORK PLACEMENT  NOTE  Date:  09/03/2015  Patient Details  Name: [name] MRN: [id number] Date of Birth: 25-Apr-1921  Clinical Social Work is seeking post-discharge placement for this patient at the [place] level of care (*CSW will initial, date and re-position this form in  chart as items are completed):  Yes   Patient/family provided with [place] Work Department's list of facilities offering this level of care within the geographic area requested by the patient (or if unable, by the patient's family).  Yes   Patient/family informed of their freedom to choose among providers that offer the needed level of care, that participate in Medicare, Medicaid or managed care program needed by the patient, have an available bed and are willing to accept the patient.  Yes   Patient/family informed of [facility]'s ownership interest in [facility] and [facility], as well as of the fact that they are under no obligation to receive care at these facilities.  PASRR submitted to EDS on 02/05/13     PASRR number received on 02/05/13     Existing PASRR number confirmed on       FL2 transmitted to all facilities in geographic area requested by pt/family on 09/01/15     FL2 transmitted to all facilities within larger geographic area on 09/02/15     Patient informed that his/her managed care company has contracts with or will negotiate with certain facilities, including the following:        Yes   Patient/family informed of bed offers received.  Patient chooses bed at [facility] [place] recommends and patient chooses bed at      Patient to be transferred to [facility] [place] on 09/03/15.  Patient to be transferred to facility by ambulance [name])     Patient family notified on 09/03/15 of transfer.  Name of family member notified:  Daughter [name], at the bedside.     PHYSICIAN        Additional Comment:  09/03/15 - Received insurance authorization on 9/21 - #[id number].   _______________________________________________ Sable Feil, LCSW 09/03/2015, 6:07 PM

## 2015-09-03 NOTE — Clinical Social Work Placement (Deleted)
   CLINICAL SOCIAL WORK PLACEMENT  NOTE  Date:  09/03/2015  Patient Details  Name: [name] MRN: [id number] Date of Birth: February 17, 1921  Clinical Social Work is seeking post-discharge placement for this patient at the [place] level of care (*CSW will initial, date and re-position this form in  chart as items are completed):  Yes   Patient/family provided with [place] Work Department's list of facilities offering this level of care within the geographic area requested by the patient (or if unable, by the patient's family).  Yes   Patient/family informed of their freedom to choose among providers that offer the needed level of care, that participate in Medicare, Medicaid or managed care program needed by the patient, have an available bed and are willing to accept the patient.  Yes   Patient/family informed of [facility]'s ownership interest in [facility] and [facility], as well as of the fact that they are under no obligation to receive care at these facilities.  PASRR submitted to EDS on 02/05/13     PASRR number received on 02/05/13     Existing PASRR number confirmed on       FL2 transmitted to all facilities in geographic area requested by pt/family on 09/01/15     FL2 transmitted to all facilities within larger geographic area on 09/02/15     Patient informed that his/her managed care company has contracts with or will negotiate with certain facilities, including the following:        Yes   Patient/family informed of bed offers received.  Patient chooses bed at [facility] [place] recommends and patient chooses bed at      Patient to be transferred to [facility] [place] on 09/03/15.  Patient to be transferred to facility by ambulance [name])     Patient family notified on 09/03/15 of transfer.  Name of family member notified:  Daughter [name], at the bedside.     PHYSICIAN        Additional Comment:    _______________________________________________ Sable Feil, LCSW 09/03/2015, 6:06 PM

## 2015-09-03 NOTE — Progress Notes (Signed)
ANTICOAGULATION CONSULT NOTE - Follow Up Consult  Pharmacy Consult for Warfarin Indication: atrial fibrillation/DVT  Allergies  Allergen Reactions  . Contrast Media [Iodinated Diagnostic Agents] Hives  . Levaquin [Levofloxacin] Itching and Rash  . Penicillins Rash    "haven't had it in years"  . Vancomycin Itching and Rash    Patient Measurements: Height: 5' 4.5" (163.8 cm) Weight: 166 lb 4.8 oz (75.433 kg) IBW/kg (Calculated) : 55.85  Vital Signs: Temp: 98 F (36.7 C) (09/21 0544) Temp Source: Oral (09/21 0544) BP: 130/60 mmHg (09/21 0943) Pulse Rate: 79 (09/21 0544)  Labs:  Recent Labs  09/01/15 0445 09/02/15 0605 09/02/15 0825 09/03/15 0438  HGB 12.3 13.0  --   --   HCT 37.3 37.4  --   --   PLT 242 223  --   --   LABPROT 21.8* 21.1*  --  21.8*  INR 1.91* 1.82*  --  1.91*  CREATININE 0.74  --  0.76  --     Estimated Creatinine Clearance: 46.1 mL/min (by C-G formula based on Cr of 0.76).   Assessment: 26 YOF who presented on 9/15 with recurrent cellulitis. The patient was on warfarin PTA for hx Afib/DVT and pharmacy was consulted to resume dosing this admission.   The patient's INR is subtherapeutic today at 1.91. The patient missed her dose on 9/15. CBC okay - no overt s/sx of bleeding noted. The patient was re-educated on warfarin.  PTA dose of 5 mg daily EXCEPT for 2.5 mg on Tues/Sat  Goal of Therapy:  INR 2-3   Plan:  1. Warfarin 7.5 mg PO x 1  2. Will continue to monitor for any signs/symptoms of bleeding and will follow up with daily PT/INR  3. Would resume home dose tomorrow  [facility], Pharm.D., BCPS Clinical Pharmacist Pager: [phone] 09/03/2015 3:25 PM

## 2015-09-03 NOTE — Progress Notes (Signed)
Report called to [facility]. Patient discharged to [facility] and transported via EMS with belongings.

## 2015-09-04 ENCOUNTER — Other Ambulatory Visit: Payer: Self-pay | Admitting: *Deleted

## 2015-09-04 NOTE — Patient Outreach (Signed)
09/04/15 Pt discharged from [facility] 09/03/15 and transferred to [facility] in [place], [place], RN CM called [facility] and spoke with charge nurse Kenney Houseman who reports plan of care is for pt to be at their facility for at least 7 days for IV antibiotic therapy. Contact number for CSW given [facility].  PLAN Follow up with [facility] skilled nursing facility next week.  Jacqlyn Larsen [facility], [place] Coordinator [phone]

## 2015-09-11 ENCOUNTER — Other Ambulatory Visit: Payer: Self-pay | Admitting: *Deleted

## 2015-09-11 NOTE — Patient Outreach (Signed)
09/11/15- telephone call to [facility] at [facility] [place] for collaboration, per Altha Harm, IV antibiotics extended for 7 more days and pt has tentative discharge date of 09/20/15.  PLAN RN Care manager to follow up with CSW next week  Jacqlyn Larsen [facility], [place] Coordinator [phone]

## 2015-09-19 ENCOUNTER — Other Ambulatory Visit: Payer: Self-pay | Admitting: *Deleted

## 2015-09-19 NOTE — Patient Outreach (Signed)
09/19/15- Telephone call to [facility] at [place], spoke with Dorene Sorrow (filling in for [facility]) and pt scheduled for tentative discharge on Sunday 09/21/15.  PLAN Begin transition of care upon discharge  Jacqlyn Larsen [facility], [place] Coordinator [phone]

## 2015-09-23 ENCOUNTER — Other Ambulatory Visit: Payer: Self-pay | Admitting: *Deleted

## 2015-09-23 NOTE — Patient Outreach (Signed)
09/23/15- Telephone call for transition of care week 1, spoke with daughter [name] [facility]), pt lives with daughter, [name] reports "cellulitis so much better"  Pt discharged from skilled nursing facility 09/21/15, pt has all medications and taking as prescribed per daughter, pt to see "someone at MD office today about her medications" and daughter texting primary MD to try and get an appointment within 7 days, [facility] resumed services yesterday and are also monitoring protime/ INR, daughter is now agreeable to home visit- scheduled for 10/01/15. RN CM faxed today's transition of care note to Dr. Maudie Mercury.  THN CM Care Plan Problem One        Most Recent Value   Care Plan Problem One  high risk for readmission to hospital related to cellulitis/ sepsis   Role Documenting the Problem One  Care Management [place] for Problem One  Active   THN Long Term Goal (31-90 days)  pt will have no hospital admissions within 90 days   THN Long Term Goal Start Date  09/23/15 Barrie Folk restarted- pt hospitalized recently]   Interventions for Problem One Long Term Goal  RN CM reminded daughter of 24 hour nurse line number, reviewed other resources such as home health and on call nurse, MD.  RN CM reviewed medications over the phone with daughter.   THN CM Short Term Goal #1 (0-30 days)  pt will see primary MD within 7-10 days of discharge   [facility] CM Short Term Goal #1 Start Date  09/23/15 [goal restarted]   Interventions for Short Term Goal #1  Reviewed discharge instructions with daughter including upcoming appointments, pt to someone at Dr. Julianne Rice office today regarding medications, daughter is texting MD to see if MD can also see appointment and if not will try to get appointment within 7 days, RN CM offered assistance if daughter unable to get appointment.   THN CM Short Term Goal #2 (0-30 days)  pt wil have no worsening cellulitis/ infection within 30 days.   THN CM Short Term Goal #2 Start Date   09/23/15 [goal restarted]   Interventions for Short Term Goal #2  RN CM reviewed and reinforced signs/ symptoms infection (redness, warmth, fever, etc.) and importance of calling MD early with change in health status.      PLAN Follow up with home visit 10/01/15 Send barrier letter 10/01/15  Jacqlyn Larsen [facility], [place] Coordinator [phone]

## 2015-10-01 ENCOUNTER — Encounter: Payer: Self-pay | Admitting: *Deleted

## 2015-10-01 ENCOUNTER — Other Ambulatory Visit: Payer: Self-pay | Admitting: *Deleted

## 2015-10-01 VITALS — BP 140/60 | HR 84 | Resp 16 | Ht 64.5 in | Wt 166.0 lb

## 2015-10-01 DIAGNOSIS — L03116 Cellulitis of left lower limb: Secondary | ICD-10-CM

## 2015-10-01 NOTE — Patient Outreach (Signed)
[facility] Care Management   10/01/2015  [name] Dec 28, 1920 [id number]  [name] is an 81 y.o. female  Subjective: Initial home visit with pt, HIPAA verified, daughter [name] present, pt lives with daughter. Pt states she weighs daily "in case I gain fluid in my legs, I tell doctor"  Pt states " I keep getting cellulitis, then sepsis".  Pt goes to nutrition center 4 days per week for lunch and socialization. Pt reports she cannot afford florastor and this is new medication, states she can afford the other medications. Pt states she applied for medicaid but did not qualify.  Daughter states she is observing patient's legs daily and calls doctor for any changes. Pt states when questioned about compression hose " I don't wear them much"  Objective:   Filed Vitals:   10/01/15 1859  BP: 140/60  Pulse: 84  Resp: 16  Height: 1.638 m (5' 4.5")  Weight: 166 lb (75.297 kg)  SpO2: 97%   ROS  Physical Exam  Constitutional: She is oriented to person, place, and time. She appears well-developed and well-nourished.  HENT:  Head: Normocephalic.  Neck: Normal range of motion. Neck supple.  Cardiovascular: Normal rate and regular rhythm.   Respiratory: Effort normal and breath sounds normal.  GI: Soft. Bowel sounds are normal.  Musculoskeletal: Normal range of motion. She exhibits edema.  1+ edema right lower extremity Lower extremities bil mottled, purplish in color  Neurological: She is alert and oriented to person, place, and time.  Skin: Skin is warm and dry.  Psychiatric: She has a normal mood and affect. Her behavior is normal. Judgment and thought content normal.    Current Medications:   Current Outpatient Prescriptions  Medication Sig Dispense Refill  . atorvastatin (LIPITOR) 10 MG tablet Take 10 mg by mouth daily.    . calcium carbonate (OS-CAL - DOSED IN MG OF ELEMENTAL CALCIUM) 1250 MG tablet Take 1 tablet by mouth every morning.     .  cholecalciferol (VITAMIN D) 1000 UNITS tablet Take 1,000 Units by mouth daily.    . Cinnamon 500 MG capsule Take 1,000 mg by mouth daily.    Marland Kitchen diltiazem (CARDIZEM CD) 240 MG 24 hr capsule Take 1 capsule (240 mg total) by mouth daily. 30 capsule 0  . furosemide (LASIX) 20 MG tablet Take 1 tablet (20 mg total) by mouth daily. 30 tablet 0  . Multiple Vitamins-Minerals (MULTIVITAMINS THER. W/MINERALS) TABS Take 1 tablet by mouth daily.     . Omega-3 Fatty Acids (FISH OIL) 1000 MG CAPS Take 1,000 mg by mouth daily.    Marland Kitchen omeprazole (PRILOSEC) 20 MG capsule Take 20 mg by mouth 2 (two) times daily.    . polyethylene glycol (MIRALAX / GLYCOLAX) packet Take 17 g by mouth daily as needed for moderate constipation. 14 each 0  . psyllium (METAMUCIL) 58.6 % powder Take 1 packet by mouth 2 (two) times daily.     Marland Kitchen saccharomyces boulardii (FLORASTOR) 250 MG capsule Take 1 capsule (250 mg total) by mouth 2 (two) times daily. 30 capsule 0  . spironolactone (ALDACTONE) 25 MG tablet Take 25 mg by mouth every morning.    . warfarin (COUMADIN) 5 MG tablet Take 2.5-5 mg by mouth daily. Takes 1 tablet (5 mg) daily EXCEPT for 0.5 tablet (2.5 mg) on Tuesdays and Saturdays    . cefTRIAXone 1 g in dextrose 5 % 50 mL Inject 1 g into the vein daily. (Patient not taking: Reported on  09/23/2015) 7 Dose 0  . diltiazem (CARDIZEM CD) 240 MG 24 hr capsule Take 240 mg by mouth daily.    . feeding supplement, GLUCERNA SHAKE, (GLUCERNA SHAKE) LIQD Take 237 mLs by mouth 3 (three) times daily between meals. (Patient not taking: Reported on 10/01/2015) 90 Can 0  . rosuvastatin (CRESTOR) 5 MG tablet Take 5 mg by mouth every evening.     . vancomycin 1,250 mg in sodium chloride 0.9 % 250 mL Inject 1,250 mg into the vein daily. (Patient not taking: Reported on 09/23/2015) 7 Dose 0   No current facility-administered medications for this visit.    Functional Status:   In your present state of health, do you have any difficulty performing  the following activities: 10/01/2015 08/28/2015  Hearing? [name]  Vision? N N  Difficulty concentrating or making decisions? N Y  Walking or climbing stairs? Y Y  Dressing or bathing? N N  Doing errands, shopping? Y N  Preparing Food and eating ? N -  Using the Toilet? N -  In the past six months, have you accidently leaked urine? Y -  Do you have problems with loss of bowel control? N -  Managing your Medications? Y -  Managing your Finances? N -  Housekeeping or managing your Housekeeping? Y -    Fall/Depression Screening:    PHQ 2/9 Scores 10/01/2015 04/09/2014  PHQ - 2 Score 0 0   Fall Risk  10/01/2015 04/09/2014  Falls in the past year? Yes No  Number falls in past yr: 2 or more -  Injury with Fall? No -  Risk Factor Category  High Fall Risk -  Risk for fall due to : History of fall(s) -  Follow up Falls evaluation completed;Education provided;Falls prevention discussed -    Assessment:  RN CM sent order for pharmacy assist "extra help" with medications and to [facility] pharmacist for assistance with florastor or any ideas for comparable substitute, etc.  RN CM gave medication box and daughter and pt will be prefilling, gave [facility] calendar and reviewed resources inside calendar including 24 hour nurse line.  Pt able to apply compression hose per her demonstration for RN CM, family to assist pt as needed.  See Care plan- RN CM stressed importance of wearing compression hose, elevating legs throughout the day, calling early for change in health status/ action plan.  RN CM faxed initial home visit to primary MD Dr. Maudie Mercury.  THN CM Care Plan Problem One        Most Recent Value   Care Plan Problem One  high risk for readmission to hospital related to cellulitis/ sepsis   Role Documenting the Problem One  Care Management [facility] for Problem One  Active   THN Long Term Goal (31-90 days)  pt will have no hospital admissions within 90 days   THN Long Term Goal Start Date  09/23/15  Barrie Folk restarted- pt hospitalized recently]   Interventions for Problem One Long Term Goal  RN CM reiterated with daughter of 24 hour nurse line number, reviewed other resources such as home health and on call nurse, MD.  RN CM reviewed all medications with daughter and pt, observed all medicaitons, provided medication box for prefilling medications.   THN CM Short Term Goal #1 (0-30 days)  pt will see primary MD within 7-10 days of discharge   [facility] CM Short Term Goal #1 Start Date  09/23/15 [goal restarted]   [facility] CM Short Term  Goal #1 Met Date  10/01/15 [daughter states pt saw primary MD]   THN CM Short Term Goal #2 (0-30 days)  pt wil have no worsening cellulitis/ infection within 30 days.   THN CM Short Term Goal #2 Start Date  09/23/15 [goal restarted]   Interventions for Short Term Goal #2  RN CM reviewed and reinforced signs/ symptoms infection (redness, warmth, fever, etc.) and importance of calling MD early with change in health status., reviewed EMMI handouts Cellulitis, Sepsis.    THN CM Care Plan Problem Two        Most Recent Value   Care Plan Problem Two  Pt not consistently wearing compression hose   Role Documenting the Problem Two  Care Management Coordinator   Care Plan for Problem Two  Active   THN CM Short Term Goal #1 (0-30 days)  Pt will consistently wear compression hose for at least 12 hours per day within 30 days   THN CM Short Term Goal #1 Start Date  10/01/15   Interventions for Short Term Goal #2   RN CM reviewed with pt and daughter importance of wearing compression hose to prevent cellulitis and help with venous circulation.  Pt demonstrated applying compression hose using device similar to sock aide, pt was able to demonstrate effectively, RN CM ask daughter and other family members to assist pt.      Plan: follow up with home visit 10/29/15 Ask if pt has worn compression hose consistently Continue teaching regarding cellulitis and prevention  Jacqlyn Larsen [facility],  [place] Coordinator [phone]

## 2015-10-02 NOTE — Patient Outreach (Signed)
[facility]) Care Management  10/02/2015  MACENZIE BURFORD 12-10-1921 [id number]   Request from Jacqlyn Larsen, RN to assign Pharmacy, assigned Deanne Coffer, PharmD.  Thanks, Ronnell Freshwater. [place], [place] Assistant Phone: [phone] Fax: [phone]

## 2015-10-03 ENCOUNTER — Other Ambulatory Visit: Payer: Self-pay | Admitting: Pharmacist

## 2015-10-03 NOTE — Patient Outreach (Signed)
[facility]) Care Management  [place]   10/03/2015  [name] 13-Jan-1921 [id number]  Subjective: [name] is a 81 y.o. female who was referred to [place] for medication assistance. Patient can afford all of her medications except for Florastor.   She reports that she gets 50 capsules for about $55. Most of her medications only cost her $1 and her highest copay is $10.   Objective:   Current Medications: Current Outpatient Prescriptions  Medication Sig Dispense Refill  . atorvastatin (LIPITOR) 10 MG tablet Take 10 mg by mouth daily.    . calcium carbonate (OS-CAL - DOSED IN MG OF ELEMENTAL CALCIUM) 1250 MG tablet Take 1 tablet by mouth every morning.     . cefTRIAXone 1 g in dextrose 5 % 50 mL Inject 1 g into the vein daily. (Patient not taking: Reported on 09/23/2015) 7 Dose 0  . cholecalciferol (VITAMIN D) 1000 UNITS tablet Take 1,000 Units by mouth daily.    . Cinnamon 500 MG capsule Take 1,000 mg by mouth daily.    Marland Kitchen diltiazem (CARDIZEM CD) 240 MG 24 hr capsule Take 240 mg by mouth daily.    Marland Kitchen diltiazem (CARDIZEM CD) 240 MG 24 hr capsule Take 1 capsule (240 mg total) by mouth daily. 30 capsule 0  . feeding supplement, GLUCERNA SHAKE, (GLUCERNA SHAKE) LIQD Take 237 mLs by mouth 3 (three) times daily between meals. (Patient not taking: Reported on 10/01/2015) 90 Can 0  . furosemide (LASIX) 20 MG tablet Take 1 tablet (20 mg total) by mouth daily. 30 tablet 0  . Multiple Vitamins-Minerals (MULTIVITAMINS THER. W/MINERALS) TABS Take 1 tablet by mouth daily.     . Omega-3 Fatty Acids (FISH OIL) 1000 MG CAPS Take 1,000 mg by mouth daily.    Marland Kitchen omeprazole (PRILOSEC) 20 MG capsule Take 20 mg by mouth 2 (two) times daily.    . polyethylene glycol (MIRALAX / GLYCOLAX) packet Take 17 g by mouth daily as needed for moderate constipation. 14 each 0  . psyllium (METAMUCIL) 58.6 % powder Take 1 packet by mouth 2 (two) times daily.     .  rosuvastatin (CRESTOR) 5 MG tablet Take 5 mg by mouth every evening.     . saccharomyces boulardii (FLORASTOR) 250 MG capsule Take 1 capsule (250 mg total) by mouth 2 (two) times daily. 30 capsule 0  . spironolactone (ALDACTONE) 25 MG tablet Take 25 mg by mouth every morning.    . vancomycin 1,250 mg in sodium chloride 0.9 % 250 mL Inject 1,250 mg into the vein daily. (Patient not taking: Reported on 09/23/2015) 7 Dose 0  . warfarin (COUMADIN) 5 MG tablet Take 2.5-5 mg by mouth daily. Takes 1 tablet (5 mg) daily EXCEPT for 0.5 tablet (2.5 mg) on Tuesdays and Saturdays     No current facility-administered medications for this visit.    Functional Status: In your present state of health, do you have any difficulty performing the following activities: 10/01/2015 08/28/2015  Hearing? [name]  Vision? N N  Difficulty concentrating or making decisions? N Y  Walking or climbing stairs? Y Y  Dressing or bathing? N N  Doing errands, shopping? Y N  Preparing Food and eating ? N -  Using the Toilet? N -  In the past six months, have you accidently leaked urine? Y -  Do you have problems with loss of bowel control? N -  Managing your Medications? Y -  Managing your Finances?  N -  Housekeeping or managing your Housekeeping? Y -    Fall/Depression Screening: PHQ 2/9 Scores 10/01/2015 04/09/2014  PHQ - 2 Score 0 0    Assessment: 1. Medication assistance: patient cannot afford Florastor and there are no assistance programs. There are coupons online but they are only for a few dollars off. She has a supply to last her a few weeks.  Plan: 1. Medication assistance: will fax Dr. Maudie Mercury to see if he has an alternative that he can prescribe her that would be cheaper. As mentioned above, she has a few weeks supply so hopefully we can find a cheaper alternative in the coming weeks. Will reach out to patient on 10/10/15 to follow up.    Nicoletta Ba, PharmD, [place]  [facility] [phone]

## 2015-10-10 ENCOUNTER — Other Ambulatory Visit: Payer: Self-pay | Admitting: Pharmacist

## 2015-10-10 NOTE — Patient Outreach (Signed)
[facility]) Care Management  [place]   10/10/2015  [name] [birth year] [id number]  Subjective: [name] is a 81 y.o. female who was referred to [place] for medication assistance. Patient can afford all of her medications except for Florastor.   I called the patient to follow up and see if she was going to take the [facility] or if she was instructed by her doctor to stop it.   Objective:   Current Medications: Current Outpatient Prescriptions  Medication Sig Dispense Refill  . atorvastatin (LIPITOR) 10 MG tablet Take 10 mg by mouth daily.    . calcium carbonate (OS-CAL - DOSED IN MG OF ELEMENTAL CALCIUM) 1250 MG tablet Take 1 tablet by mouth every morning.     . cefTRIAXone 1 g in dextrose 5 % 50 mL Inject 1 g into the vein daily. (Patient not taking: Reported on 09/23/2015) 7 Dose 0  . cholecalciferol (VITAMIN D) 1000 UNITS tablet Take 1,000 Units by mouth daily.    . Cinnamon 500 MG capsule Take 1,000 mg by mouth daily.    Marland Kitchen diltiazem (CARDIZEM CD) 240 MG 24 hr capsule Take 240 mg by mouth daily.    Marland Kitchen diltiazem (CARDIZEM CD) 240 MG 24 hr capsule Take 1 capsule (240 mg total) by mouth daily. 30 capsule 0  . feeding supplement, GLUCERNA SHAKE, (GLUCERNA SHAKE) LIQD Take 237 mLs by mouth 3 (three) times daily between meals. (Patient not taking: Reported on 10/01/2015) 90 Can 0  . furosemide (LASIX) 20 MG tablet Take 1 tablet (20 mg total) by mouth daily. 30 tablet 0  . Multiple Vitamins-Minerals (MULTIVITAMINS THER. W/MINERALS) TABS Take 1 tablet by mouth daily.     . Omega-3 Fatty Acids (FISH OIL) 1000 MG CAPS Take 1,000 mg by mouth daily.    Marland Kitchen omeprazole (PRILOSEC) 20 MG capsule Take 20 mg by mouth 2 (two) times daily.    . polyethylene glycol (MIRALAX / GLYCOLAX) packet Take 17 g by mouth daily as needed for moderate constipation. 14 each 0  . psyllium (METAMUCIL) 58.6 % powder Take 1 packet by mouth 2 (two) times daily.     .  rosuvastatin (CRESTOR) 5 MG tablet Take 5 mg by mouth every evening.     . saccharomyces boulardii (FLORASTOR) 250 MG capsule Take 1 capsule (250 mg total) by mouth 2 (two) times daily. 30 capsule 0  . spironolactone (ALDACTONE) 25 MG tablet Take 25 mg by mouth every morning.    . vancomycin 1,250 mg in sodium chloride 0.9 % 250 mL Inject 1,250 mg into the vein daily. (Patient not taking: Reported on 09/23/2015) 7 Dose 0  . warfarin (COUMADIN) 5 MG tablet Take 2.5-5 mg by mouth daily. Takes 1 tablet (5 mg) daily EXCEPT for 0.5 tablet (2.5 mg) on Tuesdays and Saturdays     No current facility-administered medications for this visit.    Functional Status: In your present state of health, do you have any difficulty performing the following activities: 10/01/2015 08/28/2015  Hearing? [name]  Vision? N N  Difficulty concentrating or making decisions? N Y  Walking or climbing stairs? Y Y  Dressing or bathing? N N  Doing errands, shopping? Y N  Preparing Food and eating ? N -  Using the Toilet? N -  In the past six months, have you accidently leaked urine? Y -  Do you have problems with loss of bowel control? N -  Managing your Medications? Y -  Managing your Finances? N -  Housekeeping or managing your Housekeeping? Y -    Fall/Depression Screening: PHQ 2/9 Scores 10/01/2015 04/09/2014  PHQ - 2 Score 0 0    Assessment: 1. Medication assistance: patient cannot afford Florastor.  Plan: 1. Medication assistance: per my review of the medication list from Dr. Julianne Rice office, patient was not switched to another probiotic. I called the patient to discuss this and I had to leave a HIPAA compliant message for patient to return my phone call.  I will reach out on 10/20/15 if the patient does not return my call today.    Nicoletta Ba, PharmD, [place] [facility] [phone]

## 2015-10-13 ENCOUNTER — Other Ambulatory Visit: Payer: Self-pay | Admitting: *Deleted

## 2015-10-13 NOTE — Patient Outreach (Signed)
10/13/15- Telephone call for transition of care week 4, spoke with daughter [name], HIPAA verified, [name] states pt has been wearing compression hose daily and would like to continue working towards this goal for consistency.  Pt has all medications and taking as prescribed per [name], pt has not been weighing daily, pt has no redness to her legs but [name] states " they've been swelling some lately so I called Dr. Maudie Mercury and we're [place] Spates see him Wednesday 10/15/15"  [name] is following action plan and calling MD for changes, RN CM reinforced the action plan.  THN CM Care Plan Problem One        Most Recent Value   Care Plan Problem One  high risk for readmission to hospital related to cellulitis/ sepsis   Role Documenting the Problem One  Care Management [place] for Problem One  Active   THN Long Term Goal (31-90 days)  pt will have no hospital admissions within 90 days   THN Long Term Goal Start Date  09/23/15 Barrie Folk restarted- pt hospitalized recently]   Interventions for Problem One Long Term Goal  RN CM reviewed with daughter of 24 hour nurse line number, reviewed other resources such as home health and on call nurse, MD.   [facility] CM Short Term Goal #1 Start Date  -- [goal restarted]   THN CM Short Term Goal #1 Met Date  -- [daughter states pt saw primary MD]   THN CM Short Term Goal #2 (0-30 days)  pt wil have no worsening cellulitis/ infection within 30 days.   THN CM Short Term Goal #2 Start Date  09/23/15 [goal restarted]   Interventions for Short Term Goal #2  RN CM reinforced signs/ symptoms infection (redness, warmth, fever, etc.) and importance of calling MD early with change in health status. Pt to see Dr. Maudie Mercury on 11/2 for assessment of legs.    THN CM Care Plan Problem Two        Most Recent Value   Care Plan Problem Two  Pt not consistently wearing compression hose   Role Documenting the Problem Two  Care Management Coordinator   Care Plan for Problem Two  Active   THN CM  Short Term Goal #1 (0-30 days)  Pt will consistently wear compression hose for at least 12 hours per day within 30 days   THN CM Short Term Goal #1 Start Date  10/01/15   Interventions for Short Term Goal #2   RN CM reinforced with daughter importance of wearing compression hose to prevent cellulitis and help with venous circulation. Pt has been wearing hose daily but wants to continue working towards this goal and remaining consistent.      PLAN See pt for home visit 10/29/15  Jacqlyn Larsen [facility], [place] Coordinator [phone]

## 2015-10-20 ENCOUNTER — Other Ambulatory Visit: Payer: Self-pay | Admitting: Pharmacist

## 2015-10-20 NOTE — Patient Outreach (Signed)
[facility]) Care Management  [place]   10/20/2015  [name] March 25, 1921 [id number]  Subjective: [name] is a 81 y.o. female who was referred to [place] for medication assistance. Patient can afford all of her medications except for Florastor.   I called the patient to follow up and see if she was going to take the [facility] or if she was instructed by her doctor to stop it.   I spoke with the patient's daughter (consented) and she reports that her mother is still taking [place] copy. She reports that Dr. Maudie Mercury instructed her to continue it. Patient has been able to come up with enough money to pay for it.  Objective:   Current Medications: Current Outpatient Prescriptions  Medication Sig Dispense Refill  . atorvastatin (LIPITOR) 10 MG tablet Take 10 mg by mouth daily.    . calcium carbonate (OS-CAL - DOSED IN MG OF ELEMENTAL CALCIUM) 1250 MG tablet Take 1 tablet by mouth every morning.     . cefTRIAXone 1 g in dextrose 5 % 50 mL Inject 1 g into the vein daily. (Patient not taking: Reported on 09/23/2015) 7 Dose 0  . cholecalciferol (VITAMIN D) 1000 UNITS tablet Take 1,000 Units by mouth daily.    . Cinnamon 500 MG capsule Take 1,000 mg by mouth daily.    Marland Kitchen diltiazem (CARDIZEM CD) 240 MG 24 hr capsule Take 240 mg by mouth daily.    Marland Kitchen diltiazem (CARDIZEM CD) 240 MG 24 hr capsule Take 1 capsule (240 mg total) by mouth daily. 30 capsule 0  . feeding supplement, GLUCERNA SHAKE, (GLUCERNA SHAKE) LIQD Take 237 mLs by mouth 3 (three) times daily between meals. (Patient not taking: Reported on 10/01/2015) 90 Can 0  . furosemide (LASIX) 20 MG tablet Take 1 tablet (20 mg total) by mouth daily. 30 tablet 0  . Multiple Vitamins-Minerals (MULTIVITAMINS THER. W/MINERALS) TABS Take 1 tablet by mouth daily.     . Omega-3 Fatty Acids (FISH OIL) 1000 MG CAPS Take 1,000 mg by mouth daily.    Marland Kitchen omeprazole (PRILOSEC) 20 MG capsule Take 20 mg by mouth 2  (two) times daily.    . polyethylene glycol (MIRALAX / GLYCOLAX) packet Take 17 g by mouth daily as needed for moderate constipation. 14 each 0  . psyllium (METAMUCIL) 58.6 % powder Take 1 packet by mouth 2 (two) times daily.     . rosuvastatin (CRESTOR) 5 MG tablet Take 5 mg by mouth every evening.     . saccharomyces boulardii (FLORASTOR) 250 MG capsule Take 1 capsule (250 mg total) by mouth 2 (two) times daily. 30 capsule 0  . spironolactone (ALDACTONE) 25 MG tablet Take 25 mg by mouth every morning.    . vancomycin 1,250 mg in sodium chloride 0.9 % 250 mL Inject 1,250 mg into the vein daily. (Patient not taking: Reported on 09/23/2015) 7 Dose 0  . warfarin (COUMADIN) 5 MG tablet Take 2.5-5 mg by mouth daily. Takes 1 tablet (5 mg) daily EXCEPT for 0.5 tablet (2.5 mg) on Tuesdays and Saturdays     No current facility-administered medications for this visit.    Functional Status: In your present state of health, do you have any difficulty performing the following activities: 10/01/2015 08/28/2015  Hearing? [name]  Vision? N N  Difficulty concentrating or making decisions? N Y  Walking or climbing stairs? Y Y  Dressing or bathing? N N  Doing errands, shopping? Y [name]  and eating ? N -  Using the Toilet? N -  In the past six months, have you accidently leaked urine? Y -  Do you have problems with loss of bowel control? N -  Managing your Medications? Y -  Managing your Finances? N -  Housekeeping or managing your Housekeeping? Y -    Fall/Depression Screening: PHQ 2/9 Scores 10/01/2015 04/09/2014  PHQ - 2 Score 0 0    Assessment: 1. Medication assistance: patient is having difficulty affording Florastor.  Plan: 1. Medication assistance: patient to continue on Florastor and will pay out of pocket for it. No patient assistance programs to help decrease cost. There are coupons but they don't decrease the cost significantly. Daughter will help mother with cost. No other  pharmacy assistance needed at this time - will close out of the pharmacy program.    Nicoletta Ba, PharmD, [place] [phone]

## 2015-10-29 ENCOUNTER — Other Ambulatory Visit: Payer: Self-pay | Admitting: *Deleted

## 2015-10-29 ENCOUNTER — Ambulatory Visit: Payer: Self-pay | Admitting: *Deleted

## 2015-10-29 NOTE — Patient Outreach (Signed)
10/29/15- RN CM drove to patient's home for scheduled appointment, daughter met RN CM in driveway and states her mother is at senior center and will be there most of day for activities and lunch and visit will need to be rescheduled.  Daughter agreeable to speak with RN CM about her mother's progress and will reschedule home visit.  Daughter [name] states pt did follow up with Dr. Maudie Mercury 10/15/15 and continues to have issues with chronic edema and cellulitis, although cellulitis resolved at present, daughter states " it's bound to happen, it always flares up"  Daughter reports pt is wearing compression hose and will be getting a new pair that fits better, this is in the process.  Weight is 170 pounds and pt is weighing daily.   RN CM reviewed importance of elevating legs and wearing compression hose daily, reviewed signs/ symptoms cellulitis and calling MD early for change in health status, increasing edema, care plan not updated- will wait until home visit/ assessment.  PLAN Call daughter to reschedule home visit Review medications Assess legs, edema  Jacqlyn Larsen [facility], [place] [phone]

## 2015-10-30 ENCOUNTER — Other Ambulatory Visit: Payer: Self-pay | Admitting: *Deleted

## 2015-10-30 NOTE — Patient Outreach (Signed)
10/30/15- telephone call to patient's daughter Hilda Blades, rescheduled home visit for 11/13/15, patient's daughter is going out of town next week for Thanksgiving.  Jacqlyn Larsen [facility], [place] Coordinator [phone]

## 2015-11-03 ENCOUNTER — Other Ambulatory Visit: Payer: Self-pay | Admitting: Internal Medicine

## 2015-11-03 DIAGNOSIS — R42 Dizziness and giddiness: Secondary | ICD-10-CM

## 2015-11-12 ENCOUNTER — Inpatient Hospital Stay: Admission: RE | Admit: 2015-11-12 | Payer: PPO | Source: Ambulatory Visit

## 2015-11-13 ENCOUNTER — Ambulatory Visit: Payer: PPO | Admitting: *Deleted

## 2015-11-17 ENCOUNTER — Other Ambulatory Visit: Payer: Self-pay | Admitting: *Deleted

## 2015-11-17 ENCOUNTER — Encounter: Payer: Self-pay | Admitting: *Deleted

## 2015-11-17 NOTE — Patient Outreach (Addendum)
[facility]) Care Management   11/17/2015  [name] September 01, 1921 [id number]  [name] is an 81 y.o. female  Subjective: Routine home visit with pt, HIPAA verified, daughter present, pt states " I feel like I'm getting a cold and Dr. Maudie Mercury is calling in a Z-pak for me".  Pt reports " I have dark stools sometimes and this has been going on awhile" pt states "I've mentioned it to MD, bloodwork has been done and my hemoglobin is okay"  Pt reports she is wearing compression hose daily but still has issues with legs swelling.  Objective:   Filed Vitals:   11/17/15 1352  BP: 126/62  Pulse: 73  Resp: 16  Weight: 166 lb (75.297 kg)  SpO2: 97%   ROS  Physical Exam  Constitutional: She is oriented to person, place, and time. She appears well-developed and well-nourished.  HENT:  Head: Normocephalic.  Neck: Normal range of motion. Neck supple.  Cardiovascular: Normal rate and regular rhythm.   Respiratory: Effort normal and breath sounds normal.  GI: Soft. Bowel sounds are normal.  Musculoskeletal: Normal range of motion. She exhibits edema.  Pt wearing bil compression hose and did not remove due to difficult to reapply, edema is noted to both legs, right leg larger than right.  Neurological: She is alert and oriented to person, place, and time.  Skin: Skin is warm and dry.  Psychiatric: She has a normal mood and affect. Her behavior is normal. Judgment and thought content normal.    Current Medications:   Current Outpatient Prescriptions  Medication Sig Dispense Refill  . atorvastatin (LIPITOR) 10 MG tablet Take 10 mg by mouth daily.    . bifidobacterium infantis (ALIGN) capsule Take 1 capsule by mouth daily.    . calcium carbonate (OS-CAL - DOSED IN MG OF ELEMENTAL CALCIUM) 1250 MG tablet Take 1 tablet by mouth every morning.     . cholecalciferol (VITAMIN D) 1000 UNITS tablet Take 1,000 Units by mouth daily.    . Cinnamon 500 MG capsule Take 1,000  mg by mouth daily.    Marland Kitchen diltiazem (CARDIZEM CD) 240 MG 24 hr capsule Take 240 mg by mouth daily.    . furosemide (LASIX) 20 MG tablet Take 1 tablet (20 mg total) by mouth daily. 30 tablet 0  . Multiple Vitamins-Minerals (MULTIVITAMINS THER. W/MINERALS) TABS Take 1 tablet by mouth daily.     . Omega-3 Fatty Acids (FISH OIL) 1000 MG CAPS Take 1,000 mg by mouth daily.    Marland Kitchen omeprazole (PRILOSEC) 20 MG capsule Take 20 mg by mouth 2 (two) times daily.    . polyethylene glycol (MIRALAX / GLYCOLAX) packet Take 17 g by mouth daily as needed for moderate constipation. 14 each 0  . psyllium (METAMUCIL) 58.6 % powder Take 1 packet by mouth 2 (two) times daily.     . rosuvastatin (CRESTOR) 5 MG tablet Take 5 mg by mouth every evening.     Marland Kitchen spironolactone (ALDACTONE) 25 MG tablet Take 25 mg by mouth every morning.    . warfarin (COUMADIN) 5 MG tablet Take 2.5-5 mg by mouth daily. Takes 1 tablet (5 mg) daily EXCEPT for 0.5 tablet (2.5 mg) on Tuesdays and Saturdays    . cefTRIAXone 1 g in dextrose 5 % 50 mL Inject 1 g into the vein daily. (Patient not taking: Reported on 09/23/2015) 7 Dose 0  . diltiazem (CARDIZEM CD) 240 MG 24 hr capsule Take 1 capsule (240 mg total) by mouth  daily. 30 capsule 0   No current facility-administered medications for this visit.    Functional Status:   In your present state of health, do you have any difficulty performing the following activities: 10/01/2015 08/28/2015  Hearing? [name]  Vision? N N  Difficulty concentrating or making decisions? N Y  Walking or climbing stairs? Y Y  Dressing or bathing? N N  Doing errands, shopping? Y N  Preparing Food and eating ? N -  Using the Toilet? N -  In the past six months, have you accidently leaked urine? Y -  Do you have problems with loss of bowel control? N -  Managing your Medications? Y -  Managing your Finances? N -  Housekeeping or managing your Housekeeping? Y -    Fall/Depression Screening:    PHQ 2/9 Scores 10/01/2015  04/09/2014  PHQ - 2 Score 0 0    Assessment:  RN CM praised and encouraged pt for wearing compression hose consistently.  RN CM placed emphasis on action plan and taking medication as prescribed, encouraged pt to elevate legs throughout the day. RN CM provided and reviewed copy of "Know before you go"- a guide that assists patients to choose whether to go to primary MD, urgent care, or ED.  RN CM faxed note to Dr. Maudie Mercury reporting pt says she has hard dark stools for several months now and not sure if related to what she is eating or some other etiology.    THN CM Care Plan Problem One        Most Recent Value   Care Plan Problem One  high risk for readmission to hospital related to cellulitis/ sepsis   Role Documenting the Problem One  Care Management [place] for Problem One  Active   THN Long Term Goal (31-90 days)  pt will have no hospital admissions within 90 days   THN Long Term Goal Start Date  09/23/15 Barrie Folk restarted- pt hospitalized recently]   Interventions for Problem One Long Term Goal  RN CM reviewed with pt and daughter importance of calling MD early for change in condition, changes with legs such as edema, redness.   THN CM Short Term Goal #1 (0-30 days)  pt and daughter will verbalize signs/ symptoms of worsening cellulitis   THN CM Short Term Goal #1 Start Date  11/17/15   THN CM Short Term Goal #1 Met Date  -- [daughter states pt saw primary MD]   Interventions for Short Term Goal #1  RN CM reviewed signs and symptoms cellulitis (redness, edema, pain)   THN CM Short Term Goal #2 (0-30 days)  pt wil have no worsening cellulitis/ infection within 30 days.   THN CM Short Term Goal #2 Start Date  09/23/15 [goal restarted]   [facility] CM Short Term Goal #2 Met Date  11/17/15   Interventions for Short Term Goal #2  RN CM reinforced signs/ symptoms infection (redness, warmth, fever, etc.) and importance of calling MD early with change in health status. Pt to see Dr. Maudie Mercury on 11/2  for assessment of legs.    THN CM Care Plan Problem Two        Most Recent Value   Care Plan Problem Two  Pt not consistently wearing compression hose   Role Documenting the Problem Two  Care Management Coordinator   Care Plan for Problem Two  Active   THN CM Short Term Goal #1 (0-30 days)  Pt will consistently wear compression  hose for at least 12 hours per day within 30 days   THN CM Short Term Goal #1 Start Date  10/01/15   [facility] CM Short Term Goal #1 Met Date   11/17/15 [pt is consistently wearing compression hose]   Interventions for Short Term Goal #2   RN CM reinforced importance of wearing compression hose as prescribed to keep edema controlled.       Plan: follow up with home visit 12/18/15  Jacqlyn Larsen [facility], [place] Coordinator [phone]

## 2015-12-18 ENCOUNTER — Other Ambulatory Visit: Payer: Self-pay | Admitting: *Deleted

## 2015-12-18 ENCOUNTER — Encounter: Payer: Self-pay | Admitting: *Deleted

## 2015-12-18 NOTE — Patient Outreach (Signed)
[facility] ([facility]) Care Management   12/18/2015  [name] 12/02/1921 [id number]  [name] is an 82 y.o. female  Subjective: Routine home visit with patient, HIPAA verified, daughter [name] present, pt states she continues to have protime/ INR checked at lab, pt saw Dr. Kim last month. Pt states she goes to senior center several times per week and eats lunch, socializes.  Pt states she would like to be more active, is looking for another car because she still likes to drive.    Objective:   Filed Vitals:   12/18/15 1409  BP: 120/60  Pulse: 80  Resp: 16  SpO2: 97%   ROS  Physical Exam  Constitutional: She is oriented to person, place, and time. She appears well-developed and well-nourished.  HENT:  Head: Normocephalic.  Neck: Normal range of motion. Neck supple.  Cardiovascular: Normal rate.   Irregular heart rhythym  Respiratory: Effort normal and breath sounds normal.  GI: Soft. Bowel sounds are normal.  Musculoskeletal: She exhibits edema.  No edema in feet Lower legs are large (chronic) but no worse than usual Pt wearing compression hose lower extremities bil  Neurological: She is alert and oriented to person, place, and time.  Skin: Skin is warm and dry.  Psychiatric: She has a normal mood and affect. Her behavior is normal. Thought content normal.    Current Medications:   Current Outpatient Prescriptions  Medication Sig Dispense Refill  . atorvastatin (LIPITOR) 10 MG tablet Take 10 mg by mouth daily.    . bifidobacterium infantis (ALIGN) capsule Take 1 capsule by mouth daily.    . calcium carbonate (OS-CAL - DOSED IN MG OF ELEMENTAL CALCIUM) 1250 MG tablet Take 1 tablet by mouth every morning.     . cholecalciferol (VITAMIN D) 1000 UNITS tablet Take 1,000 Units by mouth daily.    . Cinnamon 500 MG capsule Take 1,000 mg by mouth daily.    . diltiazem (CARDIZEM CD) 240 MG 24 hr capsule Take 240 mg by mouth daily.    . furosemide  (LASIX) 20 MG tablet Take 1 tablet (20 mg total) by mouth daily. 30 tablet 0  . Multiple Vitamins-Minerals (MULTIVITAMINS THER. W/MINERALS) TABS Take 1 tablet by mouth daily.     . Omega-3 Fatty Acids (FISH OIL) 1000 MG CAPS Take 1,000 mg by mouth daily.    . omeprazole (PRILOSEC) 20 MG capsule Take 20 mg by mouth 2 (two) times daily.    . polyethylene glycol (MIRALAX / GLYCOLAX) packet Take 17 g by mouth daily as needed for moderate constipation. 14 each 0  . psyllium (METAMUCIL) 58.6 % powder Take 1 packet by mouth 2 (two) times daily.     . rosuvastatin (CRESTOR) 5 MG tablet Take 5 mg by mouth every evening.     . spironolactone (ALDACTONE) 25 MG tablet Take 25 mg by mouth every morning.    . warfarin (COUMADIN) 5 MG tablet Take 2.5-5 mg by mouth daily. Takes 1 tablet (5 mg) daily EXCEPT for 0.5 tablet (2.5 mg) on Tuesdays and Saturdays    . diltiazem (CARDIZEM CD) 240 MG 24 hr capsule Take 1 capsule (240 mg total) by mouth daily. 30 capsule 0   No current facility-administered medications for this visit.    Functional Status:   In your present state of health, do you have any difficulty performing the following activities: 10/01/2015 08/28/2015  Hearing? Y Y  Vision? N N  Difficulty concentrating or making decisions? N Y    Walking or climbing stairs? Y Y  Dressing or bathing? N N  Doing errands, shopping? Y N  Preparing Food and eating ? N -  Using the Toilet? N -  In the past six months, have you accidently leaked urine? Y -  Do you have problems with loss of bowel control? N -  Managing your Medications? Y -  Managing your Finances? N -  Housekeeping or managing your Housekeeping? Y -    Fall/Depression Screening:    PHQ 2/9 Scores 10/01/2015 04/09/2014  PHQ - 2 Score 0 0   Fall Risk  12/18/2015 10/01/2015 04/09/2014  Falls in the past year? Yes Yes No  Number falls in past yr: 2 or more 2 or more -  Injury with Fall? No No -  Risk Factor Category  High Fall Risk High Fall Risk  -  Risk for fall due to : History of fall(s) History of fall(s) -  Follow up Falls evaluation completed;Education provided;Falls prevention discussed Falls evaluation completed;Education provided;Falls prevention discussed -    Assessment:  Pt wearing compression hose daily, RN CM encouraged pt for doing well with this aspect of care, RN CM reviewed EMMI handouts with patient, gave handout Nondiscrimination and Accessibility, RN CM reviewed discharge plan and pt, daughter agreeable to discharge or transfer to RN health coach at next visit.  RN CM reviewed ways to increase endurance and stay active (see care plan).  Pt has had no exacerbation of cellulitis and does need reminders of signs/symptoms worsening cellulitis and to make sure she lets her daughter know.  RN CM faxed quarterly update to Dr. Kim, faxed today's note as well.  [facility] CM Care Plan Problem One        Most Recent Value   Care Plan Problem One  high risk for readmission to hospital related to cellulitis/ sepsis   Role Documenting the Problem One  Care Management Coordinator   Care Plan for Problem One  Active   [facility] Long Term Goal (31-90 days)  pt will have no hospital admissions within 90 days   [facility] Long Term Goal Start Date  09/23/15 [goal restarted- pt hospitalized recently]   [facility] Long Term Goal Met Date  12/18/15 [pt has had no hospitalizations]   Interventions for Problem One Long Term Goal  RN CM reinforced with pt and daughter importance of calling MD early for change in condition, changes with legs such as edema, redness.   [facility] CM Short Term Goal #1 (0-30 days)  pt and daughter will verbalize signs/ symptoms of worsening cellulitis   [facility] CM Short Term Goal #1 Start Date  12/18/15 [goal restarted- pt still needs reinforcement]   [facility] CM Short Term Goal #1 Met Date  -- [daughter states pt saw primary MD]   Interventions for Short Term Goal #1  RN CM reviewed and reinforced signs and symptoms cellulitis (redness, edema, pain)    [facility] CM Short Term Goal #2 Start Date  -- [goal restarted]    [facility] CM Care Plan Problem Two        Most Recent Value   Care Plan Problem Two  Pt has decreased endurance   Role Documenting the Problem Two  Care Management Coordinator   Care Plan for Problem Two  Active   Interventions for Problem Two Long Term Goal   RN CM reviewed importance of consistently doing some type of activity daily, avoid sitting for long periods of time.   [facility] Long Term Goal (31-90) days    pt will verbalize increased endurance within 31 days   [facility] Long Term Goal Start Date  12/18/15   [facility] CM Short Term Goal #1 (0-30 days)  Pt will walk more daily throughout the house (if cold outside) and perform light chair exercises while watching television within 30 days   [facility] CM Short Term Goal #1 Start Date  12/18/15   [facility] CM Short Term Goal #1 Met Date   -- [pt is consistently wearing compression hose]   Interventions for Short Term Goal #2   RN CM reviewed with pt and daughter importance of staying active and walking through the house several times per day, practice light chair exercises, ask pt to talk with MD about exercising and reviewed resources for community exercises for seniors.       Plan: follow up with home visit 01/15/16 Plan to discharge at next visit, tentatively transfer to RN health coach  Julie Farmer RNC, BSN [facility] Community Care Coordinator [phone]       

## 2016-01-01 DIAGNOSIS — I4891 Unspecified atrial fibrillation: Secondary | ICD-10-CM | POA: Diagnosis not present

## 2016-01-01 DIAGNOSIS — Z7901 Long term (current) use of anticoagulants: Secondary | ICD-10-CM | POA: Diagnosis not present

## 2016-01-15 ENCOUNTER — Encounter: Payer: Self-pay | Admitting: *Deleted

## 2016-01-15 ENCOUNTER — Other Ambulatory Visit: Payer: Self-pay | Admitting: *Deleted

## 2016-01-15 NOTE — Patient Outreach (Signed)
[facility]) Care Management   01/15/2016  [name] [birth year] [id number]  [name] is an 82 y.o. female  Subjective: Routine home visit with patient, HIPAA verified, daughter present, pt reports "I'm doing pretty well, I'm still going to senior center everyday"  Pt states she wears compression hose daily, pt to follow up with Dr. Maudie Mercury in March. No new issues or concerns voiced today.  Objective:   Filed Vitals:   01/15/16 1412  BP: 116/60  Pulse: 82  Resp: 18  Weight: 166 lb (75.297 kg)   ROS  Physical Exam  Constitutional: She appears well-developed and well-nourished.  HENT:  Head: Normocephalic.  Neck: Normal range of motion. Neck supple.  Cardiovascular: Normal rate.   Irregular rhythym  Respiratory: Effort normal and breath sounds normal.  GI: Soft. Bowel sounds are normal.  Musculoskeletal: Normal range of motion. She exhibits edema.  Legs are large (chronic condition) right leg larger than left, pt is wearing compression hose and removes for RN CM to observe.  No pitting edema noted.    Current Medications:   Current Outpatient Prescriptions  Medication Sig Dispense Refill  . atorvastatin (LIPITOR) 10 MG tablet Take 10 mg by mouth daily.    . bifidobacterium infantis (ALIGN) capsule Take 1 capsule by mouth daily.    . calcium carbonate (OS-CAL - DOSED IN MG OF ELEMENTAL CALCIUM) 1250 MG tablet Take 1 tablet by mouth every morning.     . cholecalciferol (VITAMIN D) 1000 UNITS tablet Take 1,000 Units by mouth daily.    . Cinnamon 500 MG capsule Take 1,000 mg by mouth daily.    Marland Kitchen diltiazem (CARDIZEM CD) 240 MG 24 hr capsule Take 240 mg by mouth daily.    . furosemide (LASIX) 20 MG tablet Take 1 tablet (20 mg total) by mouth daily. 30 tablet 0  . Multiple Vitamins-Minerals (MULTIVITAMINS THER. W/MINERALS) TABS Take 1 tablet by mouth daily.     . Omega-3 Fatty Acids (FISH OIL) 1000 MG CAPS Take 1,000 mg by mouth daily.    Marland Kitchen  omeprazole (PRILOSEC) 20 MG capsule Take 20 mg by mouth 2 (two) times daily.    . polyethylene glycol (MIRALAX / GLYCOLAX) packet Take 17 g by mouth daily as needed for moderate constipation. 14 each 0  . psyllium (METAMUCIL) 58.6 % powder Take 1 packet by mouth 2 (two) times daily.     . rosuvastatin (CRESTOR) 5 MG tablet Take 5 mg by mouth every evening.     Marland Kitchen spironolactone (ALDACTONE) 25 MG tablet Take 25 mg by mouth every morning.    . warfarin (COUMADIN) 5 MG tablet Take 2.5-5 mg by mouth daily. Takes 1 tablet (5 mg) daily EXCEPT for 0.5 tablet (2.5 mg) on Tuesdays and Saturdays    . diltiazem (CARDIZEM CD) 240 MG 24 hr capsule Take 1 capsule (240 mg total) by mouth daily. 30 capsule 0   No current facility-administered medications for this visit.    Functional Status:   In your present state of health, do you have any difficulty performing the following activities: 10/01/2015 08/28/2015  Hearing? [name]  Vision? N N  Difficulty concentrating or making decisions? N Y  Walking or climbing stairs? Y Y  Dressing or bathing? N N  Doing errands, shopping? Y N  Preparing Food and eating ? N -  Using the Toilet? N -  In the past six months, have you accidently leaked urine? Y -  Do you have problems  with loss of bowel control? N -  Managing your Medications? Y -  Managing your Finances? N -  Housekeeping or managing your Housekeeping? Y -    Fall/Depression Screening:    PHQ 2/9 Scores 10/01/2015 04/09/2014  PHQ - 2 Score 0 0    Assessment:   RN CM discussed discharge plan with daughter and pt.  Patient and daughter not interested in [facility] health coach, daughter feels she is familiar with signs/ symptoms worsening cellulitis and knows to call Dr. Maudie Mercury, verbalizes understanding of action plan.  RN CM reviewed importance of wearing compression hose daily, importance of elevating legs, reiterated to be mindful of sodium intake.  RN CM faxed case closure letter to primary MD Dr. Maudie Mercury and mailed  case closure letter to patient's home.  Plan: Dicharge pt today.  Jacqlyn Larsen [facility], [place] Coordinator [phone]

## 2016-01-28 DIAGNOSIS — I4891 Unspecified atrial fibrillation: Secondary | ICD-10-CM | POA: Diagnosis not present

## 2016-01-28 DIAGNOSIS — E7800 Pure hypercholesterolemia, unspecified: Secondary | ICD-10-CM | POA: Diagnosis not present

## 2016-01-28 DIAGNOSIS — I10 Essential (primary) hypertension: Secondary | ICD-10-CM | POA: Diagnosis not present

## 2016-01-28 DIAGNOSIS — Z7901 Long term (current) use of anticoagulants: Secondary | ICD-10-CM | POA: Diagnosis not present

## 2016-01-28 DIAGNOSIS — K439 Ventral hernia without obstruction or gangrene: Secondary | ICD-10-CM | POA: Diagnosis not present

## 2016-01-28 DIAGNOSIS — I480 Paroxysmal atrial fibrillation: Secondary | ICD-10-CM | POA: Diagnosis not present

## 2016-03-01 DIAGNOSIS — Z7901 Long term (current) use of anticoagulants: Secondary | ICD-10-CM | POA: Diagnosis not present

## 2016-03-01 DIAGNOSIS — I4891 Unspecified atrial fibrillation: Secondary | ICD-10-CM | POA: Diagnosis not present

## 2016-03-11 DIAGNOSIS — Z0000 Encounter for general adult medical examination without abnormal findings: Secondary | ICD-10-CM | POA: Diagnosis not present

## 2016-03-11 DIAGNOSIS — R351 Nocturia: Secondary | ICD-10-CM | POA: Diagnosis not present

## 2016-03-11 DIAGNOSIS — N3941 Urge incontinence: Secondary | ICD-10-CM | POA: Diagnosis not present

## 2016-03-11 DIAGNOSIS — Z8551 Personal history of malignant neoplasm of bladder: Secondary | ICD-10-CM | POA: Diagnosis not present

## 2016-03-12 DIAGNOSIS — I480 Paroxysmal atrial fibrillation: Secondary | ICD-10-CM | POA: Diagnosis not present

## 2016-03-12 DIAGNOSIS — Z0000 Encounter for general adult medical examination without abnormal findings: Secondary | ICD-10-CM | POA: Diagnosis not present

## 2016-03-12 DIAGNOSIS — I10 Essential (primary) hypertension: Secondary | ICD-10-CM | POA: Diagnosis not present

## 2016-03-16 DIAGNOSIS — R739 Hyperglycemia, unspecified: Secondary | ICD-10-CM | POA: Diagnosis not present

## 2016-03-16 DIAGNOSIS — I10 Essential (primary) hypertension: Secondary | ICD-10-CM | POA: Diagnosis not present

## 2016-03-16 DIAGNOSIS — I480 Paroxysmal atrial fibrillation: Secondary | ICD-10-CM | POA: Diagnosis not present

## 2016-03-18 DIAGNOSIS — Z7901 Long term (current) use of anticoagulants: Secondary | ICD-10-CM | POA: Diagnosis not present

## 2016-03-18 DIAGNOSIS — I4891 Unspecified atrial fibrillation: Secondary | ICD-10-CM | POA: Diagnosis not present

## 2016-04-20 DIAGNOSIS — Z7901 Long term (current) use of anticoagulants: Secondary | ICD-10-CM | POA: Diagnosis not present

## 2016-04-20 DIAGNOSIS — I4891 Unspecified atrial fibrillation: Secondary | ICD-10-CM | POA: Diagnosis not present

## 2016-05-18 DIAGNOSIS — I4891 Unspecified atrial fibrillation: Secondary | ICD-10-CM | POA: Diagnosis not present

## 2016-05-18 DIAGNOSIS — Z7901 Long term (current) use of anticoagulants: Secondary | ICD-10-CM | POA: Diagnosis not present

## 2016-06-03 DIAGNOSIS — R05 Cough: Secondary | ICD-10-CM | POA: Diagnosis not present

## 2016-06-03 DIAGNOSIS — Z7901 Long term (current) use of anticoagulants: Secondary | ICD-10-CM | POA: Diagnosis not present

## 2016-06-03 DIAGNOSIS — J189 Pneumonia, unspecified organism: Secondary | ICD-10-CM | POA: Diagnosis not present

## 2016-06-04 DIAGNOSIS — I10 Essential (primary) hypertension: Secondary | ICD-10-CM | POA: Diagnosis not present

## 2016-06-04 DIAGNOSIS — R739 Hyperglycemia, unspecified: Secondary | ICD-10-CM | POA: Diagnosis not present

## 2016-06-17 DIAGNOSIS — R739 Hyperglycemia, unspecified: Secondary | ICD-10-CM | POA: Diagnosis not present

## 2016-06-17 DIAGNOSIS — I480 Paroxysmal atrial fibrillation: Secondary | ICD-10-CM | POA: Diagnosis not present

## 2016-06-17 DIAGNOSIS — I10 Essential (primary) hypertension: Secondary | ICD-10-CM | POA: Diagnosis not present

## 2016-06-17 DIAGNOSIS — R05 Cough: Secondary | ICD-10-CM | POA: Diagnosis not present

## 2016-06-22 DIAGNOSIS — I4891 Unspecified atrial fibrillation: Secondary | ICD-10-CM | POA: Diagnosis not present

## 2016-06-22 DIAGNOSIS — Z7901 Long term (current) use of anticoagulants: Secondary | ICD-10-CM | POA: Diagnosis not present

## 2016-06-30 DIAGNOSIS — H52223 Regular astigmatism, bilateral: Secondary | ICD-10-CM | POA: Diagnosis not present

## 2016-07-06 DIAGNOSIS — Z7901 Long term (current) use of anticoagulants: Secondary | ICD-10-CM | POA: Diagnosis not present

## 2016-07-06 DIAGNOSIS — I4891 Unspecified atrial fibrillation: Secondary | ICD-10-CM | POA: Diagnosis not present

## 2016-07-20 DIAGNOSIS — Z7901 Long term (current) use of anticoagulants: Secondary | ICD-10-CM | POA: Diagnosis not present

## 2016-07-20 DIAGNOSIS — I4891 Unspecified atrial fibrillation: Secondary | ICD-10-CM | POA: Diagnosis not present

## 2016-08-10 ENCOUNTER — Other Ambulatory Visit: Payer: Self-pay | Admitting: Internal Medicine

## 2016-08-10 DIAGNOSIS — Z1231 Encounter for screening mammogram for malignant neoplasm of breast: Secondary | ICD-10-CM

## 2016-08-25 ENCOUNTER — Ambulatory Visit
Admission: RE | Admit: 2016-08-25 | Discharge: 2016-08-25 | Disposition: A | Discharge status: Home / Self Care | Payer: PPO | Source: Ambulatory Visit | Attending: Internal Medicine | Admitting: Internal Medicine

## 2016-08-25 DIAGNOSIS — Z1231 Encounter for screening mammogram for malignant neoplasm of breast: Secondary | ICD-10-CM

## 2016-08-26 ENCOUNTER — Ambulatory Visit
Admission: RE | Admit: 2016-08-26 | Discharge: 2016-08-26 | Disposition: A | Discharge status: Home / Self Care | Payer: PPO | Source: Ambulatory Visit | Attending: Internal Medicine | Admitting: Internal Medicine

## 2016-08-26 ENCOUNTER — Other Ambulatory Visit: Payer: Self-pay | Admitting: Internal Medicine

## 2016-08-26 DIAGNOSIS — T1490XA Injury, unspecified, initial encounter: Secondary | ICD-10-CM

## 2016-08-26 DIAGNOSIS — Z7901 Long term (current) use of anticoagulants: Secondary | ICD-10-CM | POA: Diagnosis not present

## 2016-08-26 DIAGNOSIS — S0990XA Unspecified injury of head, initial encounter: Secondary | ICD-10-CM | POA: Diagnosis not present

## 2016-08-26 DIAGNOSIS — I4891 Unspecified atrial fibrillation: Secondary | ICD-10-CM | POA: Diagnosis not present

## 2016-09-10 ENCOUNTER — Other Ambulatory Visit: Payer: Self-pay | Admitting: Internal Medicine

## 2016-09-10 ENCOUNTER — Ambulatory Visit
Admission: RE | Admit: 2016-09-10 | Discharge: 2016-09-10 | Disposition: A | Discharge status: Home / Self Care | Payer: PPO | Source: Ambulatory Visit | Attending: Internal Medicine | Admitting: Internal Medicine

## 2016-09-10 DIAGNOSIS — R42 Dizziness and giddiness: Secondary | ICD-10-CM

## 2016-09-10 DIAGNOSIS — I714 Abdominal aortic aneurysm, without rupture: Secondary | ICD-10-CM | POA: Diagnosis not present

## 2016-09-23 DIAGNOSIS — I4891 Unspecified atrial fibrillation: Secondary | ICD-10-CM | POA: Diagnosis not present

## 2016-09-23 DIAGNOSIS — Z7901 Long term (current) use of anticoagulants: Secondary | ICD-10-CM | POA: Diagnosis not present

## 2016-10-13 DIAGNOSIS — R739 Hyperglycemia, unspecified: Secondary | ICD-10-CM | POA: Diagnosis not present

## 2016-10-13 DIAGNOSIS — I10 Essential (primary) hypertension: Secondary | ICD-10-CM | POA: Diagnosis not present

## 2016-10-20 DIAGNOSIS — R739 Hyperglycemia, unspecified: Secondary | ICD-10-CM | POA: Diagnosis not present

## 2016-10-20 DIAGNOSIS — M25551 Pain in right hip: Secondary | ICD-10-CM | POA: Diagnosis not present

## 2016-10-20 DIAGNOSIS — I10 Essential (primary) hypertension: Secondary | ICD-10-CM | POA: Diagnosis not present

## 2016-10-20 DIAGNOSIS — I480 Paroxysmal atrial fibrillation: Secondary | ICD-10-CM | POA: Diagnosis not present

## 2016-10-20 DIAGNOSIS — M1611 Unilateral primary osteoarthritis, right hip: Secondary | ICD-10-CM | POA: Diagnosis not present

## 2016-10-28 DIAGNOSIS — I4891 Unspecified atrial fibrillation: Secondary | ICD-10-CM | POA: Diagnosis not present

## 2016-10-28 DIAGNOSIS — Z7901 Long term (current) use of anticoagulants: Secondary | ICD-10-CM | POA: Diagnosis not present

## 2016-11-25 DIAGNOSIS — I4891 Unspecified atrial fibrillation: Secondary | ICD-10-CM | POA: Diagnosis not present

## 2016-11-25 DIAGNOSIS — Z7901 Long term (current) use of anticoagulants: Secondary | ICD-10-CM | POA: Diagnosis not present

## 2017-01-11 DIAGNOSIS — I4891 Unspecified atrial fibrillation: Secondary | ICD-10-CM | POA: Diagnosis not present

## 2017-01-11 DIAGNOSIS — Z7901 Long term (current) use of anticoagulants: Secondary | ICD-10-CM | POA: Diagnosis not present

## 2017-02-08 DIAGNOSIS — I4891 Unspecified atrial fibrillation: Secondary | ICD-10-CM | POA: Diagnosis not present

## 2017-02-08 DIAGNOSIS — Z7901 Long term (current) use of anticoagulants: Secondary | ICD-10-CM | POA: Diagnosis not present

## 2017-02-10 DIAGNOSIS — I10 Essential (primary) hypertension: Secondary | ICD-10-CM | POA: Diagnosis not present

## 2017-02-10 DIAGNOSIS — R739 Hyperglycemia, unspecified: Secondary | ICD-10-CM | POA: Diagnosis not present

## 2017-02-17 DIAGNOSIS — I4891 Unspecified atrial fibrillation: Secondary | ICD-10-CM | POA: Diagnosis not present

## 2017-02-17 DIAGNOSIS — R739 Hyperglycemia, unspecified: Secondary | ICD-10-CM | POA: Diagnosis not present

## 2017-02-17 DIAGNOSIS — Z0000 Encounter for general adult medical examination without abnormal findings: Secondary | ICD-10-CM | POA: Diagnosis not present

## 2017-02-17 DIAGNOSIS — I10 Essential (primary) hypertension: Secondary | ICD-10-CM | POA: Diagnosis not present

## 2017-02-17 DIAGNOSIS — I714 Abdominal aortic aneurysm, without rupture: Secondary | ICD-10-CM | POA: Diagnosis not present

## 2017-03-09 DIAGNOSIS — Z471 Aftercare following joint replacement surgery: Secondary | ICD-10-CM | POA: Diagnosis not present

## 2017-03-09 DIAGNOSIS — M25562 Pain in left knee: Secondary | ICD-10-CM | POA: Diagnosis not present

## 2017-03-09 DIAGNOSIS — G8929 Other chronic pain: Secondary | ICD-10-CM | POA: Diagnosis not present

## 2017-03-09 DIAGNOSIS — Z96651 Presence of right artificial knee joint: Secondary | ICD-10-CM | POA: Diagnosis not present

## 2017-03-09 DIAGNOSIS — Z4789 Encounter for other orthopedic aftercare: Secondary | ICD-10-CM | POA: Diagnosis not present

## 2017-03-15 DIAGNOSIS — I4891 Unspecified atrial fibrillation: Secondary | ICD-10-CM | POA: Diagnosis not present

## 2017-03-15 DIAGNOSIS — Z7901 Long term (current) use of anticoagulants: Secondary | ICD-10-CM | POA: Diagnosis not present

## 2017-04-11 DIAGNOSIS — Z7901 Long term (current) use of anticoagulants: Secondary | ICD-10-CM | POA: Diagnosis not present

## 2017-04-11 DIAGNOSIS — I4891 Unspecified atrial fibrillation: Secondary | ICD-10-CM | POA: Diagnosis not present

## 2017-04-26 DIAGNOSIS — I4891 Unspecified atrial fibrillation: Secondary | ICD-10-CM | POA: Diagnosis not present

## 2017-04-26 DIAGNOSIS — Z7901 Long term (current) use of anticoagulants: Secondary | ICD-10-CM | POA: Diagnosis not present

## 2017-05-10 DIAGNOSIS — Z7901 Long term (current) use of anticoagulants: Secondary | ICD-10-CM | POA: Diagnosis not present

## 2017-05-10 DIAGNOSIS — I4891 Unspecified atrial fibrillation: Secondary | ICD-10-CM | POA: Diagnosis not present

## 2017-05-12 DIAGNOSIS — R311 Benign essential microscopic hematuria: Secondary | ICD-10-CM | POA: Diagnosis not present

## 2017-05-12 DIAGNOSIS — Z8551 Personal history of malignant neoplasm of bladder: Secondary | ICD-10-CM | POA: Diagnosis not present

## 2017-06-06 DIAGNOSIS — I4891 Unspecified atrial fibrillation: Secondary | ICD-10-CM | POA: Diagnosis not present

## 2017-06-06 DIAGNOSIS — Z7901 Long term (current) use of anticoagulants: Secondary | ICD-10-CM | POA: Diagnosis not present

## 2017-07-04 DIAGNOSIS — Z7901 Long term (current) use of anticoagulants: Secondary | ICD-10-CM | POA: Diagnosis not present

## 2017-07-04 DIAGNOSIS — I4891 Unspecified atrial fibrillation: Secondary | ICD-10-CM | POA: Diagnosis not present

## 2017-07-14 ENCOUNTER — Other Ambulatory Visit: Payer: Self-pay | Admitting: Internal Medicine

## 2017-07-14 DIAGNOSIS — I714 Abdominal aortic aneurysm, without rupture, unspecified: Secondary | ICD-10-CM

## 2017-07-15 ENCOUNTER — Other Ambulatory Visit: Payer: Self-pay | Admitting: Internal Medicine

## 2017-07-15 DIAGNOSIS — Z1231 Encounter for screening mammogram for malignant neoplasm of breast: Secondary | ICD-10-CM

## 2017-07-28 DIAGNOSIS — I4891 Unspecified atrial fibrillation: Secondary | ICD-10-CM | POA: Diagnosis not present

## 2017-07-28 DIAGNOSIS — Z7901 Long term (current) use of anticoagulants: Secondary | ICD-10-CM | POA: Diagnosis not present

## 2017-08-18 DIAGNOSIS — H524 Presbyopia: Secondary | ICD-10-CM | POA: Diagnosis not present

## 2017-08-18 DIAGNOSIS — H04123 Dry eye syndrome of bilateral lacrimal glands: Secondary | ICD-10-CM | POA: Diagnosis not present

## 2017-08-18 DIAGNOSIS — H5201 Hypermetropia, right eye: Secondary | ICD-10-CM | POA: Diagnosis not present

## 2017-08-18 DIAGNOSIS — H52223 Regular astigmatism, bilateral: Secondary | ICD-10-CM | POA: Diagnosis not present

## 2017-08-22 ENCOUNTER — Other Ambulatory Visit: Payer: PPO

## 2017-08-23 DIAGNOSIS — I10 Essential (primary) hypertension: Secondary | ICD-10-CM | POA: Diagnosis not present

## 2017-08-23 DIAGNOSIS — M8580 Other specified disorders of bone density and structure, unspecified site: Secondary | ICD-10-CM | POA: Diagnosis not present

## 2017-08-23 DIAGNOSIS — I4891 Unspecified atrial fibrillation: Secondary | ICD-10-CM | POA: Diagnosis not present

## 2017-08-23 DIAGNOSIS — R319 Hematuria, unspecified: Secondary | ICD-10-CM | POA: Diagnosis not present

## 2017-08-23 DIAGNOSIS — N390 Urinary tract infection, site not specified: Secondary | ICD-10-CM | POA: Diagnosis not present

## 2017-08-23 DIAGNOSIS — R739 Hyperglycemia, unspecified: Secondary | ICD-10-CM | POA: Diagnosis not present

## 2017-08-23 DIAGNOSIS — Z780 Asymptomatic menopausal state: Secondary | ICD-10-CM | POA: Diagnosis not present

## 2017-08-25 ENCOUNTER — Ambulatory Visit
Admission: RE | Admit: 2017-08-25 | Discharge: 2017-08-25 | Disposition: A | Discharge status: Home / Self Care | Payer: PPO | Source: Ambulatory Visit | Attending: Internal Medicine | Admitting: Internal Medicine

## 2017-08-25 ENCOUNTER — Other Ambulatory Visit: Payer: Self-pay | Admitting: Internal Medicine

## 2017-08-25 DIAGNOSIS — I714 Abdominal aortic aneurysm, without rupture, unspecified: Secondary | ICD-10-CM

## 2017-08-29 ENCOUNTER — Ambulatory Visit: Payer: PPO

## 2017-08-29 DIAGNOSIS — Z0000 Encounter for general adult medical examination without abnormal findings: Secondary | ICD-10-CM | POA: Diagnosis not present

## 2017-08-29 DIAGNOSIS — I10 Essential (primary) hypertension: Secondary | ICD-10-CM | POA: Diagnosis not present

## 2017-08-29 DIAGNOSIS — R739 Hyperglycemia, unspecified: Secondary | ICD-10-CM | POA: Diagnosis not present

## 2017-08-29 DIAGNOSIS — I4891 Unspecified atrial fibrillation: Secondary | ICD-10-CM | POA: Diagnosis not present

## 2017-09-01 DIAGNOSIS — I4891 Unspecified atrial fibrillation: Secondary | ICD-10-CM | POA: Diagnosis not present

## 2017-09-01 DIAGNOSIS — Z7901 Long term (current) use of anticoagulants: Secondary | ICD-10-CM | POA: Diagnosis not present

## 2017-09-05 ENCOUNTER — Ambulatory Visit
Admission: RE | Admit: 2017-09-05 | Discharge: 2017-09-05 | Disposition: A | Discharge status: Home / Self Care | Payer: PPO | Source: Ambulatory Visit | Attending: Internal Medicine | Admitting: Internal Medicine

## 2017-09-05 DIAGNOSIS — Z1231 Encounter for screening mammogram for malignant neoplasm of breast: Secondary | ICD-10-CM | POA: Diagnosis not present

## 2017-09-29 DIAGNOSIS — I4891 Unspecified atrial fibrillation: Secondary | ICD-10-CM | POA: Diagnosis not present

## 2017-09-29 DIAGNOSIS — Z7901 Long term (current) use of anticoagulants: Secondary | ICD-10-CM | POA: Diagnosis not present

## 2017-12-16 ENCOUNTER — Ambulatory Visit (INDEPENDENT_AMBULATORY_CARE_PROVIDER_SITE_OTHER): Payer: PPO

## 2017-12-16 ENCOUNTER — Ambulatory Visit (INDEPENDENT_AMBULATORY_CARE_PROVIDER_SITE_OTHER): Payer: PPO | Admitting: Family Medicine

## 2017-12-16 ENCOUNTER — Encounter: Payer: Self-pay | Admitting: Family Medicine

## 2017-12-16 DIAGNOSIS — S139XXA Sprain of joints and ligaments of unspecified parts of neck, initial encounter: Secondary | ICD-10-CM

## 2017-12-16 DIAGNOSIS — S46311A Strain of muscle, fascia and tendon of triceps, right arm, initial encounter: Secondary | ICD-10-CM | POA: Diagnosis not present

## 2017-12-16 DIAGNOSIS — S299XXA Unspecified injury of thorax, initial encounter: Secondary | ICD-10-CM | POA: Diagnosis not present

## 2017-12-16 DIAGNOSIS — M542 Cervicalgia: Secondary | ICD-10-CM | POA: Diagnosis not present

## 2017-12-16 DIAGNOSIS — R0789 Other chest pain: Secondary | ICD-10-CM | POA: Diagnosis not present

## 2017-12-16 DIAGNOSIS — S199XXA Unspecified injury of neck, initial encounter: Secondary | ICD-10-CM | POA: Diagnosis not present

## 2017-12-16 DIAGNOSIS — M546 Pain in thoracic spine: Secondary | ICD-10-CM | POA: Diagnosis not present

## 2017-12-16 MED ORDER — CYCLOBENZAPRINE HCL 10 MG PO TABS: 10.0000 mg | ORAL_TABLET | Freq: Three times a day (TID) | ORAL | 0 refills | Status: DC | PRN
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
## 2017-12-19 ENCOUNTER — Telehealth: Payer: Self-pay | Admitting: Family Medicine

## 2017-12-19 NOTE — Telephone Encounter (Signed)
Aware. 

## 2018-01-23 ENCOUNTER — Ambulatory Visit: Payer: PPO | Admitting: Nurse Practitioner

## 2018-01-23 DIAGNOSIS — R05 Cough: Secondary | ICD-10-CM | POA: Diagnosis not present

## 2018-01-24 ENCOUNTER — Encounter: Payer: Self-pay | Admitting: Internal Medicine

## 2018-01-28 ENCOUNTER — Encounter (HOSPITAL_COMMUNITY): Payer: Self-pay

## 2018-01-28 ENCOUNTER — Emergency Department (HOSPITAL_COMMUNITY): Payer: PPO

## 2018-01-28 ENCOUNTER — Inpatient Hospital Stay (HOSPITAL_COMMUNITY)
Admission: EM | Admit: 2018-01-28 | Discharge: 2018-02-03 | DRG: 194 | Disposition: A | Discharge status: Home / Self Care | Payer: PPO | Attending: Internal Medicine | Admitting: Internal Medicine

## 2018-01-28 DIAGNOSIS — I700 Atherosclerosis of aorta: Secondary | ICD-10-CM | POA: Diagnosis present

## 2018-01-28 DIAGNOSIS — I714 Abdominal aortic aneurysm, without rupture: Secondary | ICD-10-CM | POA: Diagnosis present

## 2018-01-28 DIAGNOSIS — C679 Malignant neoplasm of bladder, unspecified: Secondary | ICD-10-CM | POA: Diagnosis present

## 2018-01-28 DIAGNOSIS — J45901 Unspecified asthma with (acute) exacerbation: Secondary | ICD-10-CM | POA: Diagnosis not present

## 2018-01-28 DIAGNOSIS — Z961 Presence of intraocular lens: Secondary | ICD-10-CM | POA: Diagnosis not present

## 2018-01-28 DIAGNOSIS — R0600 Dyspnea, unspecified: Secondary | ICD-10-CM | POA: Diagnosis not present

## 2018-01-28 DIAGNOSIS — K449 Diaphragmatic hernia without obstruction or gangrene: Secondary | ICD-10-CM | POA: Diagnosis not present

## 2018-01-28 DIAGNOSIS — Z87440 Personal history of urinary (tract) infections: Secondary | ICD-10-CM

## 2018-01-28 DIAGNOSIS — Z881 Allergy status to other antibiotic agents status: Secondary | ICD-10-CM

## 2018-01-28 DIAGNOSIS — E785 Hyperlipidemia, unspecified: Secondary | ICD-10-CM | POA: Diagnosis present

## 2018-01-28 DIAGNOSIS — J189 Pneumonia, unspecified organism: Secondary | ICD-10-CM

## 2018-01-28 DIAGNOSIS — M549 Dorsalgia, unspecified: Secondary | ICD-10-CM | POA: Diagnosis not present

## 2018-01-28 DIAGNOSIS — I482 Chronic atrial fibrillation, unspecified: Secondary | ICD-10-CM | POA: Diagnosis present

## 2018-01-28 DIAGNOSIS — Z90710 Acquired absence of both cervix and uterus: Secondary | ICD-10-CM

## 2018-01-28 DIAGNOSIS — Z880 Allergy status to penicillin: Secondary | ICD-10-CM

## 2018-01-28 DIAGNOSIS — E7800 Pure hypercholesterolemia, unspecified: Secondary | ICD-10-CM | POA: Diagnosis not present

## 2018-01-28 DIAGNOSIS — I4891 Unspecified atrial fibrillation: Secondary | ICD-10-CM | POA: Diagnosis present

## 2018-01-28 DIAGNOSIS — R0602 Shortness of breath: Secondary | ICD-10-CM | POA: Diagnosis not present

## 2018-01-28 DIAGNOSIS — K219 Gastro-esophageal reflux disease without esophagitis: Secondary | ICD-10-CM | POA: Diagnosis present

## 2018-01-28 DIAGNOSIS — M069 Rheumatoid arthritis, unspecified: Secondary | ICD-10-CM | POA: Diagnosis present

## 2018-01-28 DIAGNOSIS — I10 Essential (primary) hypertension: Secondary | ICD-10-CM

## 2018-01-28 DIAGNOSIS — I4821 Permanent atrial fibrillation: Secondary | ICD-10-CM | POA: Diagnosis present

## 2018-01-28 DIAGNOSIS — I519 Heart disease, unspecified: Secondary | ICD-10-CM

## 2018-01-28 DIAGNOSIS — R0789 Other chest pain: Secondary | ICD-10-CM

## 2018-01-28 DIAGNOSIS — R3129 Other microscopic hematuria: Secondary | ICD-10-CM | POA: Diagnosis present

## 2018-01-28 DIAGNOSIS — I2510 Atherosclerotic heart disease of native coronary artery without angina pectoris: Secondary | ICD-10-CM | POA: Diagnosis present

## 2018-01-28 DIAGNOSIS — G8929 Other chronic pain: Secondary | ICD-10-CM | POA: Diagnosis not present

## 2018-01-28 DIAGNOSIS — I110 Hypertensive heart disease with heart failure: Secondary | ICD-10-CM | POA: Diagnosis not present

## 2018-01-28 DIAGNOSIS — K760 Fatty (change of) liver, not elsewhere classified: Secondary | ICD-10-CM | POA: Diagnosis not present

## 2018-01-28 DIAGNOSIS — I739 Peripheral vascular disease, unspecified: Secondary | ICD-10-CM | POA: Diagnosis not present

## 2018-01-28 DIAGNOSIS — J40 Bronchitis, not specified as acute or chronic: Secondary | ICD-10-CM | POA: Diagnosis not present

## 2018-01-28 DIAGNOSIS — J209 Acute bronchitis, unspecified: Secondary | ICD-10-CM | POA: Diagnosis not present

## 2018-01-28 DIAGNOSIS — Z87891 Personal history of nicotine dependence: Secondary | ICD-10-CM

## 2018-01-28 DIAGNOSIS — F419 Anxiety disorder, unspecified: Secondary | ICD-10-CM | POA: Diagnosis present

## 2018-01-28 DIAGNOSIS — Z96651 Presence of right artificial knee joint: Secondary | ICD-10-CM | POA: Diagnosis present

## 2018-01-28 DIAGNOSIS — R05 Cough: Secondary | ICD-10-CM | POA: Diagnosis not present

## 2018-01-28 DIAGNOSIS — J9801 Acute bronchospasm: Secondary | ICD-10-CM | POA: Diagnosis not present

## 2018-01-28 DIAGNOSIS — F0390 Unspecified dementia without behavioral disturbance: Secondary | ICD-10-CM | POA: Diagnosis present

## 2018-01-28 DIAGNOSIS — E871 Hypo-osmolality and hyponatremia: Secondary | ICD-10-CM | POA: Diagnosis not present

## 2018-01-28 DIAGNOSIS — Z79899 Other long term (current) drug therapy: Secondary | ICD-10-CM

## 2018-01-28 DIAGNOSIS — I5032 Chronic diastolic (congestive) heart failure: Secondary | ICD-10-CM | POA: Diagnosis present

## 2018-01-28 DIAGNOSIS — Z8249 Family history of ischemic heart disease and other diseases of the circulatory system: Secondary | ICD-10-CM

## 2018-01-28 DIAGNOSIS — J181 Lobar pneumonia, unspecified organism: Secondary | ICD-10-CM | POA: Diagnosis not present

## 2018-01-28 DIAGNOSIS — Z86718 Personal history of other venous thrombosis and embolism: Secondary | ICD-10-CM

## 2018-01-28 DIAGNOSIS — I5189 Other ill-defined heart diseases: Secondary | ICD-10-CM | POA: Diagnosis present

## 2018-01-28 DIAGNOSIS — Z91041 Radiographic dye allergy status: Secondary | ICD-10-CM

## 2018-01-28 DIAGNOSIS — Z7901 Long term (current) use of anticoagulants: Secondary | ICD-10-CM

## 2018-01-28 LAB — COMPREHENSIVE METABOLIC PANEL
ALBUMIN: 3.8 g/dL (ref 3.5–5.0)
ALK PHOS: 80 U/L (ref 38–126)
ALT: 22 U/L (ref 14–54)
AST: 26 U/L (ref 15–41)
Anion gap: 13 (ref 5–15)
BILIRUBIN TOTAL: 0.7 mg/dL (ref 0.3–1.2)
BUN: 8 mg/dL (ref 6–20)
CALCIUM: 9.2 mg/dL (ref 8.9–10.3)
CO2: 25 mmol/L (ref 22–32)
CREATININE: 0.82 mg/dL (ref 0.44–1.00)
Chloride: 92 mmol/L — ABNORMAL LOW (ref 101–111)
GFR calc Af Amer: >60 mL/min (ref >60)
GFR, EST NON AFRICAN AMERICAN: 59 mL/min — AB (ref >60)
GLUCOSE: 115 mg/dL — AB (ref 65–99)
Potassium: 3.5 mmol/L (ref 3.5–5.1)
Sodium: 130 mmol/L — ABNORMAL LOW (ref 135–145)
Total Protein: 7.3 g/dL (ref 6.5–8.1)

## 2018-01-28 LAB — CBC WITH DIFFERENTIAL/PLATELET
BASOS PCT: 0 %
Basophils Absolute: 0 10*3/uL (ref 0.0–0.1)
EOS ABS: 0.3 10*3/uL (ref 0.0–0.7)
EOS PCT: 4 %
HCT: 40.9 % (ref 36.0–46.0)
Hemoglobin: 14.2 g/dL (ref 12.0–15.0)
Lymphocytes Relative: 21 %
Lymphs Abs: 1.9 10*3/uL (ref 0.7–4.0)
MCH: 33.1 pg (ref 26.0–34.0)
MCHC: 34.7 g/dL (ref 30.0–36.0)
MCV: 95.3 fL (ref 78.0–100.0)
MONO ABS: 1.2 10*3/uL — AB (ref 0.1–1.0)
Monocytes Relative: 13 %
Neutro Abs: 5.4 10*3/uL (ref 1.7–7.7)
Neutrophils Relative %: 62 %
PLATELETS: 268 10*3/uL (ref 150–400)
RBC: 4.29 MIL/uL (ref 3.87–5.11)
RDW: 12.8 % (ref 11.5–15.5)
WBC: 8.8 10*3/uL (ref 4.0–10.5)

## 2018-01-28 LAB — I-STAT CG4 LACTIC ACID, ED
LACTIC ACID, VENOUS: 0.94 mmol/L (ref 0.5–1.9)
Lactic Acid, Venous: 1.65 mmol/L (ref 0.5–1.9)

## 2018-01-28 LAB — PROTIME-INR
INR: 2.81
PROTHROMBIN TIME: 29.4 s — AB (ref 11.4–15.2)

## 2018-01-28 LAB — INFLUENZA PANEL BY PCR (TYPE A & B)
Influenza A By PCR: NEGATIVE
Influenza B By PCR: NEGATIVE

## 2018-01-28 LAB — TROPONIN I

## 2018-01-28 MED ORDER — LEVALBUTEROL HCL 1.25 MG/0.5ML IN NEBU
1.2500 mg | INHALATION_SOLUTION | RESPIRATORY_TRACT | Status: DC | PRN
  Administered 2018-01-30: 1.25 mg via RESPIRATORY_TRACT
  Filled 2018-01-28: qty 0.5

## 2018-01-28 MED ORDER — METHYLPREDNISOLONE SODIUM SUCC 125 MG IJ SOLR
60.0000 mg | Freq: Every day | INTRAMUSCULAR | Status: AC
  Administered 2018-01-29: 60 mg via INTRAVENOUS
  Filled 2018-01-28: qty 2

## 2018-01-28 MED ORDER — DILTIAZEM HCL ER COATED BEADS 240 MG PO CP24
240.0000 mg | ORAL_CAPSULE | Freq: Every day | ORAL | Status: DC
  Administered 2018-01-29 – 2018-01-30 (×2): 240 mg via ORAL
  Filled 2018-01-28 (×3): qty 1

## 2018-01-28 MED ORDER — ACETAMINOPHEN 650 MG RE SUPP: 650.0000 mg | Freq: Four times a day (QID) | RECTAL | Status: DC | PRN

## 2018-01-28 MED ORDER — WARFARIN SODIUM 5 MG PO TABS
5.0000 mg | ORAL_TABLET | ORAL | Status: DC
  Administered 2018-01-29 – 2018-01-30 (×2): 5 mg via ORAL
  Filled 2018-01-28 (×2): qty 1

## 2018-01-28 MED ORDER — METHYLPREDNISOLONE SODIUM SUCC 125 MG IJ SOLR
125.0000 mg | Freq: Once | INTRAMUSCULAR | Status: AC
  Administered 2018-01-28: 125 mg via INTRAVENOUS
  Filled 2018-01-28: qty 2

## 2018-01-28 MED ORDER — WARFARIN SODIUM 2.5 MG PO TABS: 2.5000 mg | ORAL_TABLET | ORAL | Status: DC

## 2018-01-28 MED ORDER — SPIRONOLACTONE 25 MG PO TABS
25.0000 mg | ORAL_TABLET | Freq: Every day | ORAL | Status: DC
  Administered 2018-01-29 – 2018-02-03 (×6): 25 mg via ORAL
  Filled 2018-01-28 (×6): qty 1

## 2018-01-28 MED ORDER — LEVALBUTEROL HCL 1.25 MG/0.5ML IN NEBU
1.2500 mg | INHALATION_SOLUTION | Freq: Once | RESPIRATORY_TRACT | Status: AC
  Administered 2018-01-28: 1.25 mg via RESPIRATORY_TRACT
  Filled 2018-01-28: qty 0.5

## 2018-01-28 MED ORDER — ATORVASTATIN CALCIUM 10 MG PO TABS
10.0000 mg | ORAL_TABLET | Freq: Every day | ORAL | Status: DC
  Administered 2018-01-29 – 2018-02-03 (×6): 10 mg via ORAL
  Filled 2018-01-28 (×6): qty 1

## 2018-01-28 MED ORDER — WARFARIN - PHARMACIST DOSING INPATIENT
Freq: Every day | Status: DC
  Administered 2018-01-29 – 2018-02-01 (×3)

## 2018-01-28 MED ORDER — ONDANSETRON HCL 4 MG/2ML IJ SOLN: 4.0000 mg | Freq: Four times a day (QID) | INTRAMUSCULAR | Status: DC | PRN

## 2018-01-28 MED ORDER — SODIUM CHLORIDE 0.9 % IV SOLN
100.0000 mg | Freq: Two times a day (BID) | INTRAVENOUS | Status: DC
  Filled 2018-01-28 (×2): qty 100

## 2018-01-28 MED ORDER — PREDNISONE 20 MG PO TABS
40.0000 mg | ORAL_TABLET | Freq: Every day | ORAL | Status: AC
  Administered 2018-01-30 – 2018-02-02 (×4): 40 mg via ORAL
  Filled 2018-01-28 (×3): qty 2

## 2018-01-28 MED ORDER — ONDANSETRON HCL 4 MG PO TABS: 4.0000 mg | ORAL_TABLET | Freq: Four times a day (QID) | ORAL | Status: DC | PRN

## 2018-01-28 MED ORDER — GUAIFENESIN ER 600 MG PO TB12
600.0000 mg | ORAL_TABLET | Freq: Two times a day (BID) | ORAL | Status: DC
  Administered 2018-01-29 – 2018-02-03 (×12): 600 mg via ORAL
  Filled 2018-01-28 (×12): qty 1

## 2018-01-28 MED ORDER — ACETAMINOPHEN 325 MG PO TABS
650.0000 mg | ORAL_TABLET | Freq: Four times a day (QID) | ORAL | Status: DC | PRN
  Administered 2018-01-29 – 2018-01-30 (×3): 650 mg via ORAL
  Filled 2018-01-28 (×3): qty 2

## 2018-01-28 MED ORDER — SODIUM CHLORIDE 0.9 % IV SOLN
INTRAVENOUS | Status: DC
  Administered 2018-01-28: via INTRAVENOUS

## 2018-01-28 MED ORDER — IPRATROPIUM-ALBUTEROL 0.5-2.5 (3) MG/3ML IN SOLN
3.0000 mL | Freq: Four times a day (QID) | RESPIRATORY_TRACT | Status: DC
  Administered 2018-01-29: 3 mL via RESPIRATORY_TRACT
  Filled 2018-01-28: qty 3

## 2018-01-28 MED ORDER — HYDROCODONE-ACETAMINOPHEN 5-325 MG PO TABS: 1.0000 | ORAL_TABLET | ORAL | Status: DC | PRN

## 2018-01-28 MED ORDER — ALBUTEROL SULFATE (2.5 MG/3ML) 0.083% IN NEBU
5.0000 mg | INHALATION_SOLUTION | Freq: Once | RESPIRATORY_TRACT | Status: AC
  Administered 2018-01-28: 5 mg via RESPIRATORY_TRACT
  Filled 2018-01-28: qty 6

## 2018-01-28 NOTE — ED Provider Notes (Signed)
[facility] EMERGENCY DEPARTMENT Provider Note   CSN: [id number] Arrival date & time: 01/28/18  1244     History   Chief Complaint Chief Complaint  Patient presents with  . pneumonia/SOB    HPI [name] is a 84 y.o. female.  HPI Patient was seen on Monday by her primary care physician and started on Levaquin for pneumonia.  Initial low-grade fever has improved.  Patient continues to have nonproductive cough and diffuse wheezing.  She also complains of some left-sided chest wall pain worse with coughing and palpation. Past Medical History:  Diagnosis Date  . AAA (abdominal aortic aneurysm) ([place])   . Anxiety   . Bladder cancer ([place]) dx'd 2011   Chronic microscopic hematuria; transitional cell cancer  . Blood transfusion 1960   "related to hysterectomy"  . Chronic atrial fibrillation (HCC)    Anticoagulated with warfarin, rate control with diltiazem and Toprol  . Chronic back pain   . Chronic bronchitis    "get it basically 3 times/yr"  . Chronic headache    "probably 2/wk" (08/28/2015)  . Concussion w/o coma 8/54/6270   Complicated by subarachnoid hemorrhage.  "even now has times when she's not able to comprehend" (05/08/12)  . Coronary artery disease    Nonobstructive  . Diverticulitis   . Diverticulosis   . DVT (deep venous thrombosis), right 2005   "right calf after 8 foot fall"  . Edema of both legs    Chronic, thought to be secondary to DVTs  . External hemorrhoids   . Family history of adverse reaction to anesthesia 2012   daughter "had OR for crushed hand; had problems w/anesthesia & I was in there all day long" (  . Fatty liver   . Frequent UTI   . GERD (gastroesophageal reflux disease)   . H/O hiatal hernia   . High cholesterol   . Migraine    "rare now" (08/28/2015)  . Pneumonia ~ 2010;  2013; 07/2015  . Rheumatoid arthritis(714.0)    "hands" (08/28/2015)  . Urinary frequency     Patient Active Problem List   Diagnosis  Date Noted  . Cellulitis 08/28/2015  . Sepsis due to cellulitis ([place]) 08/18/2015  . Cellulitis of left lower extremity 08/14/2015  . ARF (acute renal failure) ([place]) 08/14/2015  . Aortic aneurysm ([place])   . Pyrexia   . Left ventricular diastolic dysfunction, NYHA class 2 12/21/2013  . Aortic heart murmur 12/21/2013  . AAA (abdominal aortic aneurysm) ([place]) 12/21/2013  . Hyponatremia 11/23/2013  . History of DVT of lower extremity 11/23/2013  . Fever 11/22/2013  . Cellulitis of leg, right 11/08/2013  . Viral gastroenteritis 11/08/2013  . Sepsis ([place]) 11/07/2013  . 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 Hypokalemia 11/07/2013  . Fever and chills 11/07/2013  . Chronic atrial fibrillation ([place])   . Edema of both legs   . 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 Normal coronary angiogram 1999 after an abnormal stress test 05/10/2012  . Chronic anticoagulation 05/10/2012  . PVD, moderate bilat carotid disease 05/10/2012  . Normal left ventricular systolic function, 2D 3/50 05/10/2012  . Elevated brain natriuretic peptide (BNP) level- 1558 on admission 05/10/2012  . Dyslipidemia 05/10/2012  . Hypertension   . GERD (gastroesophageal reflux disease)   . DVT (deep venous thrombosis), right   . Pneumonia   . Bladder cancer ([place])   . Rheumatoid arthritis(714.0)   . Bronchitis 11/11/2011  . Primary bladder malignant neoplasm ([place]) 11/10/2011  . CANDIDIASIS, ESOPHAGEAL 01/26/2008  .  COLONIC POLYPS, ADENOMATOUS 01/26/2008  . HEMORRHOIDS, EXTERNAL 01/26/2008  . GASTRITIS, CHRONIC 01/26/2008  . HIATAL HERNIA 01/26/2008  . DIVERTICULOSIS OF COLON 01/26/2008    Past Surgical History:  Procedure Laterality Date  . BREAST CYST EXCISION Left 1960's?   2 cysts; benign  . CARDIAC CATHETERIZATION  1980's  . CATARACT EXTRACTION W/ INTRAOCULAR LENS  IMPLANT, BILATERAL Bilateral 1992  . CYSTOSCOPY  11/11/2011   Procedure: CYSTOSCOPY;  Surgeon: Malka So;  Location: [facility];  Service: Urology;  Laterality:  N/A;  . CYSTOSTOMY W/ BLADDER BIOPSY  10/08/2013  . INCONTINENCE SURGERY  1980's  . JOINT REPLACEMENT    . KNEE ARTHROSCOPY Right 2005   S/P fall  . Lower Extremity Venous Doppler  11/10/2013   No DVT or superficial thrombus enlarged inguinal lymph node noted in the right. No Baker's cyst.  . TONSILLECTOMY  1952  . TOTAL KNEE ARTHROPLASTY Right 02/05/2013   Procedure: TOTAL KNEE ARTHROPLASTY;  Surgeon: Mauri Pole, MD;  Location: [facility];  Service: Orthopedics;  Laterality: Right;  . TRANSURETHRAL RESECTION OF BLADDER TUMOR  11/11/2011   Procedure: TRANSURETHRAL RESECTION OF BLADDER TUMOR (TURBT);  Surgeon: Malka So;  Location: [facility];  Service: Urology;  Laterality: N/A;  Cysto, Bladder Biopsy, TURBT with [place],   . TRANSURETHRAL RESECTION OF BLADDER TUMOR WITH GYRUS (TURBT-GYRUS)  2007; 2009; 2010   "for tumors on surface of bladder"  . TUMOR EXCISION  1976; 1980's   "fatty tumor cut off her upper back; left thumb"  . VAGINAL HYSTERECTOMY  1960   partial     OB History    No data available       Home Medications    Prior to Admission medications   Medication Sig Start Date End Date Taking? Authorizing Provider  bifidobacterium infantis (ALIGN) capsule Take 1 capsule by mouth daily.   Yes [provider]  calcium carbonate (OS-CAL - DOSED IN MG OF ELEMENTAL CALCIUM) 1250 MG tablet Take 1 tablet by mouth every morning.    Yes [provider]  cholecalciferol (VITAMIN D) 1000 UNITS tablet Take 1,000 Units by mouth daily.   Yes [provider]  Cinnamon 500 MG capsule Take 1,000 mg by mouth daily.   Yes [provider]  cyclobenzaprine (FLEXERIL) 10 MG tablet Take 1 tablet (10 mg total) by mouth 3 (three) times daily as needed for muscle spasms. 12/16/17  Yes Dettinger, Fransisca Kaufmann, MD  diltiazem (CARDIZEM CD) 240 MG 24 hr capsule Take 240 mg by mouth daily. 11/02/13  Yes [provider]  diltiazem (CARDIZEM CD) 240 MG 24 hr capsule Take  1 capsule (240 mg total) by mouth daily. 09/02/15  Yes [name], MD  furosemide (LASIX) 20 MG tablet Take 1 tablet (20 mg total) by mouth daily. 09/02/15  Yes [name], MD  levofloxacin (LEVAQUIN) 500 MG tablet Take 500 mg by mouth daily.   Yes [provider]  Multiple Vitamins-Minerals (MULTIVITAMINS THER. W/MINERALS) TABS Take 1 tablet by mouth daily.    Yes [provider]  Omega-3 Fatty Acids (FISH OIL) 1000 MG CAPS Take 1,000 mg by mouth daily.   Yes [provider]  omeprazole (PRILOSEC) 20 MG capsule Take 20 mg by mouth 2 (two) times daily.   Yes [provider]  psyllium (METAMUCIL) 58.6 % powder Take 1 packet by mouth 2 (two) times daily.    Yes [provider]  spironolactone (ALDACTONE) 25 MG tablet Take 25 mg by mouth every  morning.   Yes [provider]  warfarin (COUMADIN) 5 MG tablet Take 2.5-5 mg by mouth daily. Takes 1 tablet (5 mg) daily EXCEPT for 0.5 tablet (2.5 mg) on Tuesdays and Saturdays 05/23/14  Yes [provider]  atorvastatin (LIPITOR) 10 MG tablet Take 10 mg by mouth daily.    [provider]  polyethylene glycol (MIRALAX / GLYCOLAX) packet Take 17 g by mouth daily as needed for moderate constipation. Patient not taking: Reported on 01/28/2018 09/02/15   [name], MD  rosuvastatin (CRESTOR) 5 MG tablet Take 5 mg by mouth every evening.     [provider]    Family History Family History  Problem Relation Age of Onset  . Anesthesia problems Daughter   . Prostate cancer Brother   . Breast cancer Other        neice  . Heart disease Brother   . Colon cancer Neg Hx     Social History Social History   Tobacco Use  . Smoking status: Former Smoker    Packs/day: 1.00    Years: 40.00    Pack years: 40.00    Types: Cigarettes    Last attempt to quit: 11/07/1982    Years since quitting: 35.2  . Smokeless tobacco: Former Systems developer    Types: Snuff  Substance Use Topics  . Alcohol  use: Yes    Alcohol/week: 4.2 oz    Types: 7 Glasses of wine per week  . Drug use: No     Allergies   Contrast media [iodinated diagnostic agents]; Levaquin [levofloxacin]; Penicillins; and Vancomycin   Review of Systems Review of Systems  Constitutional: Negative for chills and fever.  HENT: Positive for congestion. Negative for sinus pressure, sinus pain, sore throat and trouble swallowing.   Respiratory: Positive for cough, chest tightness, shortness of breath and wheezing.   Cardiovascular: Positive for chest pain. Negative for palpitations and leg swelling.  Gastrointestinal: Negative for abdominal pain, constipation, diarrhea, nausea and vomiting.  Genitourinary: Negative for dysuria, flank pain and frequency.  Musculoskeletal: Negative for arthralgias, back pain, myalgias and neck pain.  Skin: Negative for rash and wound.  Neurological: Negative for dizziness, weakness, light-headedness, numbness and headaches.  All other systems reviewed and are negative.    Physical Exam Updated Vital Signs BP (!) 154/82   Pulse 100   Temp 98.2 F (36.8 C) (Oral)   Resp 20   Ht 5\' 4"  (1.626 m)   Wt 75.8 kg (167 lb)   SpO2 97%   BMI 28.67 kg/m   Physical Exam  Constitutional: She is oriented to person, place, and time. She appears well-developed and well-nourished. No distress.  HENT:  Head: Normocephalic and atraumatic.  Mouth/Throat: Oropharynx is clear and moist. No oropharyngeal exudate.  Eyes: EOM are normal. Pupils are equal, round, and reactive to light.  Neck: Normal range of motion. Neck supple. No JVD present. No tracheal deviation present. No thyromegaly present.  Cardiovascular:  Irregularly irregular  Pulmonary/Chest: Effort normal.  Diffuse expiratory wheezing throughout  Abdominal: Soft. Bowel sounds are normal. There is no tenderness. There is no rebound and no guarding.  Musculoskeletal: Normal range of motion. She exhibits no edema or tenderness.  Mild  bilateral lower extremity swelling without asymmetry or tenderness.  Lymphadenopathy:    She has no cervical adenopathy.  Neurological: She is alert and oriented to person, place, and time.  Moves all extremities without focal deficit.  Sensation intact.  Skin: Skin is warm and dry. Capillary refill  takes less than 2 seconds. No rash noted. She is not diaphoretic. No erythema.  Psychiatric: She has a normal mood and affect. Her behavior is normal.  Nursing note and vitals reviewed.    ED Treatments / Results  Labs (all labs ordered are listed, but only abnormal results are displayed) Labs Reviewed  COMPREHENSIVE METABOLIC PANEL - Abnormal; Notable for the following components:      Result Value   Sodium 130 (*)    Chloride 92 (*)    Glucose, Bld 115 (*)    GFR calc non Af Amer 59 (*)    All other components within normal limits  CBC WITH DIFFERENTIAL/PLATELET - Abnormal; Notable for the following components:   Monocytes Absolute 1.2 (*)    All other components within normal limits  PROTIME-INR - Abnormal; Notable for the following components:   Prothrombin Time 29.4 (*)    All other components within normal limits  TROPONIN I  I-STAT CG4 LACTIC ACID, ED  I-STAT CG4 LACTIC ACID, ED    EKG  EKG Interpretation  Date/Time:  Saturday January 28 2018 14:27:03 EST Ventricular Rate:  88 PR Interval:    QRS Duration: 97 QT Interval:  355 QTC Calculation: 397 R Axis:   -30 Text Interpretation:  Atrial fibrillation Left axis deviation Probable anteroseptal infarct, old Nonspecific T abnormalities, lateral leads Confirmed by Julianne Rice [phone]) on 01/28/2018 2:31:37 PM       Radiology Dg Chest 2 View  Result Date: 01/28/2018 CLINICAL DATA:  Cough and shortness of breath. EXAM: CHEST  2 VIEW COMPARISON:  Chest x-ray dated January 23, 2018. FINDINGS: Stable mild cardiomegaly. Normal pulmonary vascularity. Atherosclerotic calcification of the aortic arch. Stable  scarring/atelectasis in the bilateral lower lobes. No focal consolidation, pleural effusion, or pneumothorax. No acute osseous abnormality. IMPRESSION: No active cardiopulmonary disease. Electronically Signed   By: Titus Dubin M.D.   On: 01/28/2018 13:28    Procedures Procedures (including critical care time)  Medications Ordered in ED Medications  methylPREDNISolone sodium succinate (SOLU-MEDROL) 125 mg/2 mL injection 125 mg (125 mg Intravenous Given 01/28/18 1432)  albuterol (PROVENTIL) (2.5 MG/3ML) 0.083% nebulizer solution 5 mg (5 mg Nebulization Given 01/28/18 1419)  levalbuterol (XOPENEX) nebulizer solution 1.25 mg (1.25 mg Nebulization Given 01/28/18 1651)     Initial Impression / Assessment and Plan / ED Course  I have reviewed the triage vital signs and the nursing notes.  Pertinent labs & imaging results that were available during my care of the patient were reviewed by me and considered in my medical decision making (see chart for details).    Patient received multiple breathing treatments and IV Solu-Medrol.  Has persistent wheezing and dyspnea though maintaining oxygen saturations.  Chest pain is atypical.  No evidence of ischemia on EKG.  Normal troponin.  INR is therapeutic.  Low suspicion for PE.  Discussed with hospitalist who will admit.  Final Clinical Impressions(s) / ED Diagnoses   Final diagnoses:  Bronchitis with bronchospasm  Dyspnea, unspecified type  Atypical chest pain    ED Discharge Orders    None       Julianne Rice, MD 01/28/18 1901

## 2018-01-28 NOTE — Progress Notes (Signed)
ANTICOAGULATION CONSULT NOTE - Initial Consult  Pharmacy Consult for Warfarin  Indication: atrial fibrillation  Allergies  Allergen Reactions  . Contrast Media [Iodinated Diagnostic Agents] Hives  . Levaquin [Levofloxacin] Itching and Rash  . Penicillins Rash    "haven't had it in years"  . Vancomycin Itching and Rash   Patient Measurements: Height: 5\' 5"  (165.1 cm) Weight: 161 lb 4.8 oz (73.2 kg) IBW/kg (Calculated) : 57  Vital Signs: Temp: 98 F (36.7 C) (02/16 2258) Temp Source: Oral (02/16 2258) BP: 131/89 (02/16 2258) Pulse Rate: 64 (02/16 2258)  Labs: Recent Labs    01/28/18 1251 01/28/18 1411 01/28/18 1429  HGB 14.2  --   --   HCT 40.9  --   --   PLT 268  --   --   LABPROT  --   --  29.4*  INR  --   --  2.81  CREATININE 0.82  --   --   TROPONINI  --  <0.03  --     Estimated Creatinine Clearance: 42.1 mL/min (by C-G formula based on SCr of 0.82 mg/dL).   Medical History: Past Medical History:  Diagnosis Date  . AAA (abdominal aortic aneurysm) ([place])   . Anxiety   . Bladder cancer ([place]) dx'd 2011   Chronic microscopic hematuria; transitional cell cancer  . Blood transfusion 1960   "related to hysterectomy"  . Chronic atrial fibrillation (H[facility])    Anticoagulated with warfarin, rate control with diltiazem and Toprol  . Chronic back pain   . Chronic bronchitis    "get it basically 3 times/yr"  . Chronic headache    "probably 2/wk" (08/28/2015)  . Concussion w/o coma 7/85/8850   Complicated by subarachnoid hemorrhage.  "even now has times when she's not able to comprehend" (05/08/12)  . Coronary artery disease    Nonobstructive  . Diverticulitis   . Diverticulosis   . DVT (deep venous thrombosis), right 2005   "right calf after 8 foot fall"  . Edema of both legs    Chronic, thought to be secondary to DVTs  . External hemorrhoids   . Family history of adverse reaction to anesthesia 2012   daughter "had OR for crushed hand; had problems w/anesthesia &  I was in there all day long" (  . Fatty liver   . Frequent UTI   . GERD (gastroesophageal reflux disease)   . H/O hiatal hernia   . High cholesterol   . Migraine    "rare now" (08/28/2015)  . Pneumonia ~ 2010;  2013; 07/2015  . Rheumatoid arthritis(714.0)    "hands" (08/28/2015)  . Urinary frequency    Assessment: 84 y/o F here with COPD exacerbation, on warfarin PTA for afib, INR is therapeutic at 2.81 on admit, CBC good.   Warfarin PTA dose: 2.5 mg on Tues/Sat, 5 mg all other days  Goal of Therapy:  INR 2-3 Monitor platelets by anticoagulation protocol: Yes   Plan:  Warfarin per home regimen Daily INR Monitor for bleeding  Narda Bonds 01/28/2018,11:15 PM

## 2018-01-28 NOTE — ED Notes (Signed)
Pt ambulated throughout hallway. O2 saturation began at 98% and remained steady at 95%-98% range throughout ambulation. No assist needed, no shortness of breath noted.

## 2018-01-28 NOTE — ED Triage Notes (Signed)
Patient here with complaint of ongoing decreased appetite and cough after being diagnosed with pneumonia last Monday. Currently taking levaquin as prescribed. Pain to chest and ribs with cough, denies fever. Alert and oriented

## 2018-01-28 NOTE — H&P (Signed)
[name] ZOX:[id number] DOB: 09-07-1921 DOA: 01/28/2018     PCP: [name], MD   Outpatient Specialists: Cardiology   Patient coming from:    home Lives   With family    Chief Complaint: Cough Shortness of breath  HPI: [name] is a 84 y.o. female with medical history significant of bladder cancer, a.fib on coumadin, AAA, HTN, GERD    Presented with 2 week of shortness of breath SOB and wheezing 6 days  Ago she presented to PCP was diagnosed with pneumonia she was started on Levaquin.  But states despite taking it for past 6 days she has not improved.  She continues to have wheezing dry cough ribs have been sore from coughing. Reports increased wheezing, fatigue She is endorsing some chest in her ribs worse with coughing she may have had some low fevers initially but not anymore.  No sick contacts. She reports going to "elderly home" during the day to set up chairs and make coffee She is unsure of what medications she takes  Dates in the past she has been diagnosed with bronchitis, She used to smoke but not recently   While in ER: Given Nebulizer treatment with some improvement.  Given solumedrol.   Significant initial  Findings:   LA 0.94 INR 2.81 Trop <0.03 NA 130 K 3.5 Cr 0.82  WBC 8.8 Hg 14.2  Influenza negative  CXR non acute IN ER:  Temp (24hrs), Avg:98.2 F (36.8 C), Min:98.2 F (36.8 C), Max:98.2 F (36.8 C)      on arrival  ED Triage Vitals [01/28/18 1253]  Enc Vitals Group     BP (!) 155/89     Pulse Rate 94     Resp 18     Temp 98.2 F (36.8 C)     Temp Source Oral     SpO2 98 %     Weight 167 lb (75.8 kg)     Height 5\' 4"  (1.626 m)     Head Circumference      Peak Flow      Pain Score      Pain Loc      Pain Edu?      Excl. in [place]?     Latest RR 20 97% HR 100 BP154/82  Following Medications were ordered in ER: Medications  methylPREDNISolone sodium succinate (SOLU-MEDROL) 125 mg/2 mL injection 125 mg (125 mg  Intravenous Given 01/28/18 1432)  albuterol (PROVENTIL) (2.5 MG/3ML) 0.083% nebulizer solution 5 mg (5 mg Nebulization Given 01/28/18 1419)  levalbuterol (XOPENEX) nebulizer solution 1.25 mg (1.25 mg Nebulization Given 01/28/18 1651)      Hospitalist was called for admission for reactive airway exacerbation in the setting of recent respiratory illness  Regarding pertinent Chronic problems: It is on Cardizem and Coumadin for history of atrial fibrillation  Review of Systems:    Pertinent positives include: Fevers, chills, chest pain, shortness of breath at rest.  dyspnea on exertion, non-productive cough,  Constitutional:  No weight loss, night sweats, , fatigue, weight loss  HEENT:  No headaches, Difficulty swallowing,Tooth/dental problems,Sore throat,  No sneezing, itching, ear ache, nasal congestion, post nasal drip,  Cardio-vascular:  No  Orthopnea, PND, anasarca, dizziness, palpitations.no Bilateral lower extremity swelling  GI:  No heartburn, indigestion, abdominal pain, nausea, vomiting, diarrhea, change in bowel habits, loss of appetite, melena, blood in stool, hematemesis Resp:  no, No excess mucus, no productive cough, No  No coughing up of blood.No change in color of  mucus.No wheezing. Skin:  no rash or lesions. No jaundice GU:  no dysuria, change in color of urine, no urgency or frequency. No straining to urinate.  No flank pain.  Musculoskeletal:  No joint pain or no joint swelling. No decreased range of motion. No back pain.  Psych:  No change in mood or affect. No depression or anxiety. No memory loss.  Neuro: no localizing neurological complaints, no tingling, no weakness, no double vision, no gait abnormality, no slurred speech, no confusion  As per HPI otherwise 10 point review of systems negative.   Past Medical History: Past Medical History:  Diagnosis Date  . AAA (abdominal aortic aneurysm) ([place])   . Anxiety   . Bladder cancer ([place]) dx'd 2011   Chronic  microscopic hematuria; transitional cell cancer  . Blood transfusion 1960   "related to hysterectomy"  . Chronic atrial fibrillation (HCC)    Anticoagulated with warfarin, rate control with diltiazem and Toprol  . Chronic back pain   . Chronic bronchitis    "get it basically 3 times/yr"  . Chronic headache    "probably 2/wk" (08/28/2015)  . Concussion w/o coma 6/71/2458   Complicated by subarachnoid hemorrhage.  "even now has times when she's not able to comprehend" (05/08/12)  . Coronary artery disease    Nonobstructive  . Diverticulitis   . Diverticulosis   . DVT (deep venous thrombosis), right 2005   "right calf after 8 foot fall"  . Edema of both legs    Chronic, thought to be secondary to DVTs  . External hemorrhoids   . Family history of adverse reaction to anesthesia 2012   daughter "had OR for crushed hand; had problems w/anesthesia & I was in there all day long" (  . Fatty liver   . Frequent UTI   . GERD (gastroesophageal reflux disease)   . H/O hiatal hernia   . High cholesterol   . Migraine    "rare now" (08/28/2015)  . Pneumonia ~ 2010;  2013; 07/2015  . Rheumatoid arthritis(714.0)    "hands" (08/28/2015)  . Urinary frequency    Past Surgical History:  Procedure Laterality Date  . BREAST CYST EXCISION Left 1960's?   2 cysts; benign  . CARDIAC CATHETERIZATION  1980's  . CATARACT EXTRACTION W/ INTRAOCULAR LENS  IMPLANT, BILATERAL Bilateral 1992  . CYSTOSCOPY  11/11/2011   Procedure: CYSTOSCOPY;  Surgeon: Malka So;  Location: [facility];  Service: Urology;  Laterality: N/A;  . CYSTOSTOMY W/ BLADDER BIOPSY  10/08/2013  . INCONTINENCE SURGERY  1980's  . JOINT REPLACEMENT    . KNEE ARTHROSCOPY Right 2005   S/P fall  . Lower Extremity Venous Doppler  11/10/2013   No DVT or superficial thrombus enlarged inguinal lymph node noted in the right. No Baker's cyst.  . TONSILLECTOMY  1952  . TOTAL KNEE ARTHROPLASTY Right 02/05/2013   Procedure: TOTAL KNEE ARTHROPLASTY;   Surgeon: Mauri Pole, MD;  Location: [facility];  Service: Orthopedics;  Laterality: Right;  . TRANSURETHRAL RESECTION OF BLADDER TUMOR  11/11/2011   Procedure: TRANSURETHRAL RESECTION OF BLADDER TUMOR (TURBT);  Surgeon: Malka So;  Location: [facility];  Service: Urology;  Laterality: N/A;  Cysto, Bladder Biopsy, TURBT with [place],   . TRANSURETHRAL RESECTION OF BLADDER TUMOR WITH GYRUS (TURBT-GYRUS)  2007; 2009; 2010   "for tumors on surface of bladder"  . TUMOR EXCISION  1976; 1980's   "fatty tumor cut off her upper back; left thumb"  . [place]  partial      Social History:  Ambulatory  cane,     reports that she quit smoking about 35 years ago. Her smoking use included cigarettes. She has a 40.00 pack-year smoking history. She has quit using smokeless tobacco. Her smokeless tobacco use included snuff. She reports that she drinks about 4.2 oz of alcohol per week. She reports that she does not use drugs.  Allergies:   Allergies  Allergen Reactions  . Contrast Media [Iodinated Diagnostic Agents] Hives  . Levaquin [Levofloxacin] Itching and Rash  . Penicillins Rash    "haven't had it in years"  . Vancomycin Itching and Rash       Family History:   Family History  Problem Relation Age of Onset  . Anesthesia problems Daughter   . Prostate cancer Brother   . Breast cancer Other        neice  . Heart disease Brother   . Colon cancer Neg Hx     Medications: Prior to Admission medications   Medication Sig Start Date End Date Taking? Authorizing Provider  bifidobacterium infantis (ALIGN) capsule Take 1 capsule by mouth daily.   Yes [provider]  calcium carbonate (OS-CAL - DOSED IN MG OF ELEMENTAL CALCIUM) 1250 MG tablet Take 1 tablet by mouth every morning.    Yes [provider]  cholecalciferol (VITAMIN D) 1000 UNITS tablet Take 1,000 Units by mouth daily.   Yes [provider]  Cinnamon 500 MG capsule Take 1,000 mg by mouth  daily.   Yes [provider]  cyclobenzaprine (FLEXERIL) 10 MG tablet Take 1 tablet (10 mg total) by mouth 3 (three) times daily as needed for muscle spasms. 12/16/17  Yes Dettinger, Fransisca Kaufmann, MD  diltiazem (CARDIZEM CD) 240 MG 24 hr capsule Take 240 mg by mouth daily. 11/02/13  Yes [provider]  diltiazem (CARDIZEM CD) 240 MG 24 hr capsule Take 1 capsule (240 mg total) by mouth daily. 09/02/15  Yes [name], MD  furosemide (LASIX) 20 MG tablet Take 1 tablet (20 mg total) by mouth daily. 09/02/15  Yes [name], MD  levofloxacin (LEVAQUIN) 500 MG tablet Take 500 mg by mouth daily.   Yes [provider]  Multiple Vitamins-Minerals (MULTIVITAMINS THER. W/MINERALS) TABS Take 1 tablet by mouth daily.    Yes [provider]  Omega-3 Fatty Acids (FISH OIL) 1000 MG CAPS Take 1,000 mg by mouth daily.   Yes [provider]  omeprazole (PRILOSEC) 20 MG capsule Take 20 mg by mouth 2 (two) times daily.   Yes [provider]  psyllium (METAMUCIL) 58.6 % powder Take 1 packet by mouth 2 (two) times daily.    Yes [provider]  spironolactone (ALDACTONE) 25 MG tablet Take 25 mg by mouth every morning.   Yes [provider]  warfarin (COUMADIN) 5 MG tablet Take 2.5-5 mg by mouth daily. Takes 1 tablet (5 mg) daily EXCEPT for 0.5 tablet (2.5 mg) on Tuesdays and Saturdays 05/23/14  Yes [provider]  atorvastatin (LIPITOR) 10 MG tablet Take 10 mg by mouth daily.    [provider]  polyethylene glycol (MIRALAX / GLYCOLAX) packet Take 17 g by mouth daily as needed for moderate constipation. Patient not taking: Reported on 01/28/2018 09/02/15   [name], MD  rosuvastatin (CRESTOR) 5 MG tablet Take 5 mg by mouth every evening.     [provider]    Physical Exam: Patient Vitals for the past 24 hrs:  BP  Temp Temp src Pulse Resp SpO2 Height Weight  01/28/18 1830 (!) 154/82 - - 100 20 97 % - -  01/28/18 1726 - - - 89  14 96 % - -  01/28/18 1700 (!) 145/62 - - - 13 94 % - -  01/28/18 1651 - - - - - 93 % - -  01/28/18 1630 131/69 - - (!) 102 15 95 % - -  01/28/18 1600 128/66 - - 96 15 94 % - -  01/28/18 1445 (!) 152/60 - - (!) 108 17 98 % - -  01/28/18 1430 - - - - (!) 28 99 % - -  01/28/18 1415 (!) 143/66 - - (!) 57 - 96 % - -  01/28/18 1253 (!) 155/89 98.2 F (36.8 C) Oral 94 18 98 % 5\' 4"  (1.626 m) 75.8 kg (167 lb)    1. General:  in No Acute distress  Chronically ill -appearing 2. Psychological: Alert and  Oriented 3. Head/ENT:    Dry Mucous Membranes                          Head Non traumatic, neck supple                          Poor Dentition 4. SKIN:   decreased Skin turgor,  Skin clean Dry and intact no rash 5. Heart: Regular rate and rhythm no  Murmur, no Rub or gallop 6. Lungs:coarse breath sounds occasional  wheezes some crackles   7. Abdomen: Soft, non-tender, Non distended   obese   8. Lower extremities: no clubbing, cyanosis, or edema 9. Neurologically Grossly intact, moving all 4 extremities equally  10. MSK: Normal range of motion   body mass index is 28.67 kg/m.  Labs on Admission:   Labs on Admission: I have personally reviewed following labs and imaging studies  CBC: Recent Labs  Lab 01/28/18 1251  WBC 8.8  NEUTROABS 5.4  HGB 14.2  HCT 40.9  MCV 95.3  PLT 742   Basic Metabolic Panel: Recent Labs  Lab 01/28/18 1251  NA 130*  K 3.5  CL 92*  CO2 25  GLUCOSE 115*  BUN 8  CREATININE 0.82  CALCIUM 9.2   GFR: Estimated Creatinine Clearance: 41.8 mL/min (by C-G formula based on SCr of 0.82 mg/dL). Liver Function Tests: Recent Labs  Lab 01/28/18 1251  AST 26  ALT 22  ALKPHOS 80  BILITOT 0.7  PROT 7.3  ALBUMIN 3.8   No results for input(s): LIPASE, AMYLASE in the last 168 hours. No results for input(s): AMMONIA in the last 168 hours. Coagulation Profile: Recent Labs  Lab 01/28/18 1429  INR 2.81   Cardiac Enzymes: Recent Labs  Lab  01/28/18 1411  TROPONINI <0.03   BNP (last 3 results) No results for input(s): PROBNP in the last 8760 hours. HbA1C: No results for input(s): HGBA1C in the last 72 hours. CBG: No results for input(s): GLUCAP in the last 168 hours. Lipid Profile: No results for input(s): CHOL, HDL, LDLCALC, TRIG, CHOLHDL, LDLDIRECT in the last 72 hours. Thyroid Function Tests: No results for input(s): TSH, T4TOTAL, FREET4, T3FREE, THYROIDAB in the last 72 hours. Anemia Panel: No results for input(s): VITAMINB12, FOLATE, FERRITIN, TIBC, IRON, RETICCTPCT in the last 72 hours. Urine analysis: Sepsis Labs: @LABRCNTIP (procalcitonin:4,lacticidven:4) )No results found for this or any previous visit (from the past 240 hour(s)).     UA  not  ordered  Lab Results  Component Value Date   HGBA1C (H) 09/18/2007    6.3 (NOTE)   The ADA recommends the following therapeutic goals for glycemic   control related to Hgb A1C measurement:   Goal of Therapy:   < 7.0% Hgb A1C   Action Suggested:  > 8.0% Hgb A1C   Ref:  Diabetes Care, 22, Suppl. 1, 1999    Estimated Creatinine Clearance: 41.8 mL/min (by C-G formula based on SCr of 0.82 mg/dL).  BNP (last 3 results) No results for input(s): PROBNP in the last 8760 hours.   ECG REPORT  Independently reviewed Rate: 87  Rhythm: a.fib ST&T Change: Lateral T wave inversions QTC 397  Filed Weights   01/28/18 1253  Weight: 75.8 kg (167 lb)     Cultures:    Component Value Date/Time   SDES BLOOD RIGHT HAND 08/28/2015 1600   SPECREQUEST BOTTLES DRAWN AEROBIC ONLY 4CC 08/28/2015 1600   CULT NO GROWTH 5 DAYS 08/28/2015 1600   REPTSTATUS 09/02/2015 FINAL 08/28/2015 1600     Radiological Exams on Admission: Dg Chest 2 View  Result Date: 01/28/2018 CLINICAL DATA:  Cough and shortness of breath. EXAM: CHEST  2 VIEW COMPARISON:  Chest x-ray dated January 23, 2018. FINDINGS: Stable mild cardiomegaly. Normal pulmonary vascularity. Atherosclerotic calcification of the  aortic arch. Stable scarring/atelectasis in the bilateral lower lobes. No focal consolidation, pleural effusion, or pneumothorax. No acute osseous abnormality. IMPRESSION: No active cardiopulmonary disease. Electronically Signed   By: Titus Dubin M.D.   On: 01/28/2018 13:28    Chart has been reviewed    Assessment/Plan   84 y.o. female with medical history significant of bladder cancer, a.fib on coumadin, AAA, HTN, GERD   Admitted for reactive airway exacerbation in the setting of recent respiratory illness   Present on Admission: . Reactive airway disease with acute exacerbation -  -  - Will initiate: Steroid taper  -  Antibiotics   Doxycycline, -  XopenexPRN, - scheduled duoneb,  -  Breo or Dulera at discharge   -  Mucinex.   would benefit from from PFT's as outpatient Titrate O2 to saturation >90%. Follow patients respiratory status.  Order respiratory panel   influenza PCR negative  Currently mentating well no evidence of symptomatic hypercarbia   . Atrial fibrillation with RVR (HCC) - mild likely due to recent Nebulizer treatment, unsure if had her Diltiazem today. Will give home dose diltiazem, gentle fluids and Monitor may need Diltiazem IV if does not improve.       - CHA2DS2 vas score 4: continue current anticoagulation with  Coumadin per pharmacy,    . Hypertension - stable continue home meds will need occasional review to make sure she is taking correct medications at home     . Bladder cancer ([place]) stable followed by urology . GERD (gastroesophageal reflux disease) -stable continue home medications  . Hyponatremia -obtain urine electrolytes could be secondary to mild dehydration given recent illness, hold Lasix for today give gentle fluids . Left ventricular diastolic dysfunction, NYHA class 2 -   currently appears to be slightly on the dry side, hold home diuretics for tonight and restart when appears euvolemic, carefuly follow fluid status and Cr    Other plan  as per orders.  DVT prophylaxis:  coumadin     Code Status:  FULL CODE   as per patient    Family Communication:   Family not  at  Bedside    Disposition Plan:  To home once workup is complete and patient is stable                              Consults called: none   Admission status:   inpatient       Level of care   stepdwon until HR is stabalized            I have spent a total of 56 min on this admission      01/28/2018, 10:01 PM    [facility]  Pager [phone]   after 2 AM please page floor coverage PA If 7AM-7PM, please contact the day team taking care of the patient  [email or web address]  Password [email or web address]

## 2018-01-28 NOTE — Progress Notes (Addendum)
Messaged Pharmacy and Dr. Silas [place] on-call [facility] per Coumadin Saturday dose not taken per patient statements, pt requesting her Sat  Eve dose.  INR high and will recheck tomorrow.  No Coumadin order tonight for Saturday dose.

## 2018-01-29 ENCOUNTER — Other Ambulatory Visit: Payer: Self-pay

## 2018-01-29 LAB — COMPREHENSIVE METABOLIC PANEL
ALK PHOS: 67 U/L (ref 38–126)
ALT: 20 U/L (ref 14–54)
AST: 24 U/L (ref 15–41)
Albumin: 3.4 g/dL — ABNORMAL LOW (ref 3.5–5.0)
Anion gap: 12 (ref 5–15)
BILIRUBIN TOTAL: 0.8 mg/dL (ref 0.3–1.2)
BUN: 9 mg/dL (ref 6–20)
CALCIUM: 9 mg/dL (ref 8.9–10.3)
CO2: 23 mmol/L (ref 22–32)
CREATININE: 0.7 mg/dL (ref 0.44–1.00)
Chloride: 97 mmol/L — ABNORMAL LOW (ref 101–111)
GFR calc non Af Amer: >60 mL/min (ref >60)
Glucose, Bld: 195 mg/dL — ABNORMAL HIGH (ref 65–99)
Potassium: 3.1 mmol/L — ABNORMAL LOW (ref 3.5–5.1)
Sodium: 132 mmol/L — ABNORMAL LOW (ref 135–145)
TOTAL PROTEIN: 6.4 g/dL — AB (ref 6.5–8.1)

## 2018-01-29 LAB — RESPIRATORY PANEL BY PCR
Adenovirus: NOT DETECTED
BORDETELLA PERTUSSIS-RVPCR: NOT DETECTED
CORONAVIRUS 229E-RVPPCR: NOT DETECTED
Chlamydophila pneumoniae: NOT DETECTED
Coronavirus HKU1: NOT DETECTED
Coronavirus NL63: NOT DETECTED
Coronavirus OC43: NOT DETECTED
INFLUENZA A-RVPPCR: NOT DETECTED
INFLUENZA B-RVPPCR: NOT DETECTED
METAPNEUMOVIRUS-RVPPCR: NOT DETECTED
Mycoplasma pneumoniae: NOT DETECTED
PARAINFLUENZA VIRUS 2-RVPPCR: NOT DETECTED
PARAINFLUENZA VIRUS 3-RVPPCR: NOT DETECTED
PARAINFLUENZA VIRUS 4-RVPPCR: NOT DETECTED
Parainfluenza Virus 1: NOT DETECTED
RESPIRATORY SYNCYTIAL VIRUS-RVPPCR: NOT DETECTED
RHINOVIRUS / ENTEROVIRUS - RVPPCR: NOT DETECTED

## 2018-01-29 LAB — CBC
HCT: 37.2 % (ref 36.0–46.0)
HEMOGLOBIN: 12.9 g/dL (ref 12.0–15.0)
MCH: 32.7 pg (ref 26.0–34.0)
MCHC: 34.7 g/dL (ref 30.0–36.0)
MCV: 94.4 fL (ref 78.0–100.0)
PLATELETS: 242 10*3/uL (ref 150–400)
RBC: 3.94 MIL/uL (ref 3.87–5.11)
RDW: 12.7 % (ref 11.5–15.5)
WBC: 8.4 10*3/uL (ref 4.0–10.5)

## 2018-01-29 LAB — TROPONIN I: Troponin I: <0.03 ng/mL (ref <0.03)

## 2018-01-29 LAB — PHOSPHORUS: Phosphorus: 2.7 mg/dL (ref 2.5–4.6)

## 2018-01-29 LAB — SODIUM, URINE, RANDOM: SODIUM UR: 19 mmol/L

## 2018-01-29 LAB — PROTIME-INR
INR: 2.23
Prothrombin Time: 24.5 seconds — ABNORMAL HIGH (ref 11.4–15.2)

## 2018-01-29 LAB — TSH: TSH: 0.822 u[IU]/mL (ref 0.350–4.500)

## 2018-01-29 LAB — BRAIN NATRIURETIC PEPTIDE: B Natriuretic Peptide: 282.5 pg/mL — ABNORMAL HIGH (ref 0.0–100.0)

## 2018-01-29 LAB — MRSA PCR SCREENING: MRSA by PCR: NEGATIVE

## 2018-01-29 LAB — OSMOLALITY, URINE: Osmolality, Ur: 260 mOsm/kg — ABNORMAL LOW (ref 300–900)

## 2018-01-29 LAB — CREATININE, URINE, RANDOM: Creatinine, Urine: 48.4 mg/dL

## 2018-01-29 LAB — MAGNESIUM: MAGNESIUM: 1.7 mg/dL (ref 1.7–2.4)

## 2018-01-29 MED ORDER — SODIUM CHLORIDE 0.9 % IV SOLN
100.0000 mg | Freq: Two times a day (BID) | INTRAVENOUS | Status: DC
  Administered 2018-01-29 – 2018-01-30 (×2): 100 mg via INTRAVENOUS
  Filled 2018-01-29 (×3): qty 100

## 2018-01-29 MED ORDER — IPRATROPIUM BROMIDE 0.02 % IN SOLN
0.5000 mg | Freq: Three times a day (TID) | RESPIRATORY_TRACT | Status: DC
  Administered 2018-01-29 – 2018-01-30 (×2): 0.5 mg via RESPIRATORY_TRACT
  Filled 2018-01-29 (×2): qty 2.5

## 2018-01-29 MED ORDER — LEVALBUTEROL HCL 0.63 MG/3ML IN NEBU
0.6300 mg | INHALATION_SOLUTION | Freq: Three times a day (TID) | RESPIRATORY_TRACT | Status: DC
  Administered 2018-01-29 – 2018-01-30 (×2): 0.63 mg via RESPIRATORY_TRACT
  Filled 2018-01-29 (×2): qty 3

## 2018-01-29 MED ORDER — POTASSIUM CHLORIDE CRYS ER 20 MEQ PO TBCR
40.0000 meq | EXTENDED_RELEASE_TABLET | Freq: Once | ORAL | Status: AC
  Administered 2018-01-29: 40 meq via ORAL
  Filled 2018-01-29: qty 2

## 2018-01-29 MED ORDER — POTASSIUM CHLORIDE CRYS ER 20 MEQ PO TBCR: 40.0000 meq | EXTENDED_RELEASE_TABLET | Freq: Every day | ORAL | Status: DC

## 2018-01-29 MED ORDER — POTASSIUM CHLORIDE CRYS ER 20 MEQ PO TBCR
40.0000 meq | EXTENDED_RELEASE_TABLET | Freq: Every day | ORAL | Status: DC
  Administered 2018-01-30 – 2018-02-01 (×3): 40 meq via ORAL
  Filled 2018-01-29 (×3): qty 2

## 2018-01-29 MED ORDER — DILTIAZEM HCL ER 60 MG PO CP12
60.0000 mg | ORAL_CAPSULE | Freq: Two times a day (BID) | ORAL | Status: DC
  Administered 2018-01-29 – 2018-01-30 (×3): 60 mg via ORAL
  Filled 2018-01-29 (×3): qty 1

## 2018-01-29 MED ORDER — FUROSEMIDE 10 MG/ML IJ SOLN
40.0000 mg | Freq: Once | INTRAMUSCULAR | Status: AC
  Administered 2018-01-29: 40 mg via INTRAVENOUS
  Filled 2018-01-29: qty 4

## 2018-01-29 MED ORDER — ACETAMINOPHEN 80 MG PO CHEW
80.0000 mg | CHEWABLE_TABLET | Freq: Four times a day (QID) | ORAL | Status: DC | PRN
  Filled 2018-01-29: qty 1

## 2018-01-29 MED ORDER — IPRATROPIUM-ALBUTEROL 0.5-2.5 (3) MG/3ML IN SOLN
3.0000 mL | Freq: Three times a day (TID) | RESPIRATORY_TRACT | Status: DC
  Administered 2018-01-29: 3 mL via RESPIRATORY_TRACT
  Filled 2018-01-29: qty 3

## 2018-01-29 MED ORDER — METOPROLOL SUCCINATE ER 25 MG PO TB24
12.5000 mg | ORAL_TABLET | Freq: Every day | ORAL | Status: DC
  Administered 2018-01-29 – 2018-02-03 (×6): 12.5 mg via ORAL
  Filled 2018-01-29 (×6): qty 1

## 2018-01-29 MED ORDER — POLYETHYLENE GLYCOL 3350 17 G PO PACK
17.0000 g | PACK | Freq: Every day | ORAL | Status: DC | PRN
  Administered 2018-01-30 (×2): 17 g via ORAL
  Filled 2018-01-29 (×3): qty 1

## 2018-01-29 NOTE — Progress Notes (Signed)
ANTICOAGULATION CONSULT NOTE - Initial Consult  Pharmacy Consult for Warfarin  Indication: atrial fibrillation  Allergies  Allergen Reactions  . Contrast Media [Iodinated Diagnostic Agents] Hives  . Levaquin [Levofloxacin] Itching and Rash  . Penicillins Rash    "haven't had it in years"  . Vancomycin Itching and Rash   Patient Measurements: Height: 5\' 5"  (165.1 cm) Weight: 161 lb (73 kg) IBW/kg (Calculated) : 57  Vital Signs: Temp: 97.6 F (36.4 C) (02/17 0825) Temp Source: Oral (02/17 0825) BP: 125/77 (02/17 0825) Pulse Rate: 105 (02/17 0825)  Labs: Recent Labs    01/28/18 1251 01/28/18 1411 01/28/18 1429 01/28/18 2342 01/29/18 0421  HGB 14.2  --   --   --  12.9  HCT 40.9  --   --   --  37.2  PLT 268  --   --   --  242  LABPROT  --   --  29.4*  --  24.5*  INR  --   --  2.81  --  2.23  CREATININE 0.82  --   --   --  0.70  TROPONINI  --  <0.03  --  <0.03 <0.03    Estimated Creatinine Clearance: 43 mL/min (by C-G formula based on SCr of 0.7 mg/dL).   Medical History: Past Medical History:  Diagnosis Date  . AAA (abdominal aortic aneurysm) ([place])   . Anxiety   . Bladder cancer ([place]) dx'd 2011   Chronic microscopic hematuria; transitional cell cancer  . Blood transfusion 1960   "related to hysterectomy"  . Chronic atrial fibrillation (HCC)    Anticoagulated with warfarin, rate control with diltiazem and Toprol  . Chronic back pain   . Chronic bronchitis    "get it basically 3 times/yr"  . Chronic headache    "probably 2/wk" (08/28/2015)  . Concussion w/o coma 2/69/4854   Complicated by subarachnoid hemorrhage.  "even now has times when she's not able to comprehend" (05/08/12)  . Coronary artery disease    Nonobstructive  . Diverticulitis   . Diverticulosis   . DVT (deep venous thrombosis), right 2005   "right calf after 8 foot fall"  . Edema of both legs    Chronic, thought to be secondary to DVTs  . External hemorrhoids   . Family history of adverse  reaction to anesthesia 2012   daughter "had OR for crushed hand; had problems w/anesthesia & I was in there all day long" (  . Fatty liver   . Frequent UTI   . GERD (gastroesophageal reflux disease)   . H/O hiatal hernia   . High cholesterol   . Migraine    "rare now" (08/28/2015)  . Pneumonia ~ 2010;  2013; 07/2015  . Rheumatoid arthritis(714.0)    "hands" (08/28/2015)  . Urinary frequency    Assessment: 84 y/o F here with COPD exacerbation, on warfarin PTA for afib Warfarin PTA dose: 2.5 mg on Tues/Sat, 5 mg all other days CBC wnl, INR therapeutic at 2.23, pt missed 2/16 dose.    Goal of Therapy:  INR 2-3 Monitor platelets by anticoagulation protocol: Yes   Plan:  Continue warfarin per home regimen Daily INR Monitor for bleeding  Bertis Ruddy, PharmD Pharmacy Resident Pager #: [phone] 01/29/2018 9:00 AM

## 2018-01-29 NOTE — Progress Notes (Signed)
PT Cancellation Note  Patient Details Name: [name] MRN: [id number] DOB: 1921-08-27   Cancelled Treatment:    Reason Eval/Treat Not Completed: Medical issues which prohibited therapy   Patient's HR noted to be 108-114 bpm at rest. Spoke with Denny Peon, RN and she reports patient's HR will jump into 150s just with eating. She reported there is a cardiology consult pending.  PT evaluation deferred at this time. Will attempt to see 01/30/18 as medically appropriate.    Jeanie Cooks , PT 01/29/2018, 4:07 PM

## 2018-01-29 NOTE — Progress Notes (Signed)
Hospitalist progress note   [name]  BHA:[id number] DOB: 1921-07-05 DOA: 01/28/2018 PCP: [name], MD   Specialists:   Brief Narrative:  44 fem prior septic shock with cellulitis lower extremities on admission 2016, chronic A. fib AAA 3.4 cm 9/16 LV dysfunction 60-65% NYHA class II prior renal insufficiency, transitional cell Bladder ca s/p BCG 2012, small subarachnoid hem 2005, mild dementia diag c PNa 2/11 place don levaquin but no improve so came to ED was sob and wheezy and sore ribs-given nebulizers  found to have no PNa but mildly elevated HR and Sodium 130 on admit with Uosm and Usodium both decreased  Assessment & Plan:   Assessment:  The primary encounter diagnosis was Bronchitis with bronchospasm. Diagnoses of Dyspnea, unspecified type and Atypical chest pain were also pertinent to this visit.  Acute bronchitis without pneumonia-finished Rx antibiotics 2/17 CXR [-], checking BNP ensure not stated heart failure-continue prednisone 40 daily as has received Solu-Medrol-continue DuoNeb 3 times daily, Xopenex can continue Mucinex 600 twice daily Chronic A. fib chads score >2 on Coumadin, INR therapeutic 2.2 not controlled heart rates 120s probably worsened by beta agonist-giving additional Cardizem 60 twice daily and probably increased total dose to 360 discharge Diastolic heart failure, NYHA II aortic sclerosis EF 60-65% thousand 15-see above discussion.  Strict ins and outs Bladder cancer status post TURBT-outpatient follow-up Probable mild hypovolemic hyponatremia, sodium level and osmolality low leading me to think that this is possible volume excess-would hold IV fluid and resume Lasix AAA last evaluated 08/2015 needs outpatient ultrasound repeat   DVT prophylaxis: Coumadin code Status:   Full   family Communication:   None bedside Disposition Plan: Inpatient   Consultants:   None  Procedures:   None  Antimicrobials:   Doxycycline 1 dose  2/17  Subjective: Awake alert little short of breath some pain with coughing in the chest No nausea no vomiting No fever no chills Has some headache but has not taken any Tylenol for it  Objective: Vitals:   01/29/18 0500 01/29/18 0806 01/29/18 0825 01/29/18 0900  BP: (!) 152/74  125/77   Pulse: (!) 103  (!) 105 (!) 120  Resp: 17  16 (!) 21  Temp:   97.6 F (36.4 C)   TempSrc:   Oral   SpO2: 96% 94% 92% 95%  Weight:      Height:        Intake/Output Summary (Last 24 hours) at 01/29/2018 1143 Last data filed at 01/29/2018 0929 Gross per 24 hour  Intake 960 ml  Output 700 ml  Net 260 ml   Filed Weights   01/28/18 1253 01/28/18 2258 01/29/18 0032  Weight: 75.8 kg (167 lb) 73.2 kg (161 lb 4.8 oz) 73 kg (161 lb)    Examination:  Alert oriented pleasant no distress Wheeze bilaterally posteriorly no crepitations no rhonchi Abdomen soft no rebound no guarding S1-S2 tachycardic telemetry shows A. fib rates 110-130 Neurologically intact looking much younger than stated age moving all 4 limbs equally Euthymic and pleasant  Data Reviewed: I have personally reviewed following labs and imaging studies  CBC: Recent Labs  Lab 01/28/18 1251 01/29/18 0421  WBC 8.8 8.4  NEUTROABS 5.4  --   HGB 14.2 12.9  HCT 40.9 37.2  MCV 95.3 94.4  PLT 268 973   Basic Metabolic Panel: Recent Labs  Lab 01/28/18 1251 01/29/18 0421  NA 130* 132*  K 3.5 3.1*  CL 92* 97*  CO2 25 23  GLUCOSE 115* 195*  BUN 8 9  CREATININE 0.82 0.70  CALCIUM 9.2 9.0  MG  --  1.7  PHOS  --  2.7   GFR: Estimated Creatinine Clearance: 43 mL/min (by C-G formula based on SCr of 0.7 mg/dL). Liver Function Tests: Recent Labs  Lab 01/28/18 1251 01/29/18 0421  AST 26 24  ALT 22 20  ALKPHOS 80 67  BILITOT 0.7 0.8  PROT 7.3 6.4*  ALBUMIN 3.8 3.4*   No results for input(s): LIPASE, AMYLASE in the last 168 hours. No results for input(s): AMMONIA in the last 168 hours. Coagulation Profile: Recent  Labs  Lab 01/28/18 1429 01/29/18 0421  INR 2.81 2.23   Cardiac Enzymes: Recent Labs  Lab 01/28/18 1411 01/28/18 2342 01/29/18 0421  TROPONINI <0.03 <0.03 <0.03   CBG: No results for input(s): GLUCAP in the last 168 hours. Urine analysis:    Component Value Date/Time   COLORURINE YELLOW 08/14/2015 0057   APPEARANCEUR CLEAR 08/14/2015 0057   LABSPEC 1.010 08/14/2015 0057   PHURINE 7.5 08/14/2015 0057   GLUCOSEU NEGATIVE 08/14/2015 0057   HGBUR LARGE (A) 08/14/2015 0057   BILIRUBINUR NEGATIVE 08/14/2015 0057   KETONESUR NEGATIVE 08/14/2015 0057   PROTEINUR 30 (A) 08/14/2015 0057   UROBILINOGEN 0.2 08/14/2015 0057   NITRITE NEGATIVE 08/14/2015 0057   LEUKOCYTESUR NEGATIVE 08/14/2015 0057     Radiology Studies: Reviewed images personally in health database    Scheduled Meds: . atorvastatin  10 mg Oral Daily  . diltiazem  240 mg Oral Daily  . guaiFENesin  600 mg Oral BID  . ipratropium-albuterol  3 mL Nebulization TID  . [START ON 01/30/2018] potassium chloride  40 mEq Oral Daily  . [START ON 01/30/2018] predniSONE  40 mg Oral Q supper  . spironolactone  25 mg Oral Daily  . [START ON 01/31/2018] warfarin  2.5 mg Oral Once per day on Tue Sat  . warfarin  5 mg Oral Once per day on Sun Mon Wed Thu Fri  . Warfarin - Pharmacist Dosing Inpatient   Does not apply q1800   Continuous Infusions: . doxycycline (VIBRAMYCIN) IV       LOS: 1 day    Time spent: [place], MD [facility] ([facility]   If 7PM-7AM, please contact night-coverage [email or web address] Password [facility] 01/29/2018, 11:43 AM

## 2018-01-30 LAB — COMPREHENSIVE METABOLIC PANEL
ALT: 26 U/L (ref 14–54)
AST: 34 U/L (ref 15–41)
Albumin: 3.1 g/dL — ABNORMAL LOW (ref 3.5–5.0)
Alkaline Phosphatase: 61 U/L (ref 38–126)
Anion gap: 11 (ref 5–15)
BUN: 12 mg/dL (ref 6–20)
CO2: 25 mmol/L (ref 22–32)
Calcium: 8.9 mg/dL (ref 8.9–10.3)
Chloride: 98 mmol/L — ABNORMAL LOW (ref 101–111)
Creatinine, Ser: 0.72 mg/dL (ref 0.44–1.00)
GFR calc Af Amer: >60 mL/min (ref >60)
GFR calc non Af Amer: >60 mL/min (ref >60)
Glucose, Bld: 133 mg/dL — ABNORMAL HIGH (ref 65–99)
Potassium: 3.5 mmol/L (ref 3.5–5.1)
Sodium: 134 mmol/L — ABNORMAL LOW (ref 135–145)
Total Bilirubin: 0.6 mg/dL (ref 0.3–1.2)
Total Protein: 5.8 g/dL — ABNORMAL LOW (ref 6.5–8.1)

## 2018-01-30 LAB — PROTIME-INR
INR: 2.29
Prothrombin Time: 25.1 seconds — ABNORMAL HIGH (ref 11.4–15.2)

## 2018-01-30 LAB — MAGNESIUM: Magnesium: 1.6 mg/dL — ABNORMAL LOW (ref 1.7–2.4)

## 2018-01-30 MED ORDER — FUROSEMIDE 20 MG PO TABS
20.0000 mg | ORAL_TABLET | Freq: Every day | ORAL | Status: DC
  Administered 2018-01-30 – 2018-02-03 (×5): 20 mg via ORAL
  Filled 2018-01-30 (×5): qty 1

## 2018-01-30 MED ORDER — DILTIAZEM HCL ER COATED BEADS 180 MG PO CP24
360.0000 mg | ORAL_CAPSULE | Freq: Every day | ORAL | Status: DC
  Administered 2018-01-30 – 2018-02-03 (×5): 360 mg via ORAL
  Filled 2018-01-30 (×5): qty 2

## 2018-01-30 MED ORDER — LEVALBUTEROL HCL 0.63 MG/3ML IN NEBU: 0.6300 mg | INHALATION_SOLUTION | Freq: Four times a day (QID) | RESPIRATORY_TRACT | Status: DC | PRN

## 2018-01-30 MED ORDER — IPRATROPIUM BROMIDE 0.02 % IN SOLN: 0.5000 mg | Freq: Four times a day (QID) | RESPIRATORY_TRACT | Status: DC | PRN

## 2018-01-30 MED ORDER — FLUTICASONE PROPIONATE 50 MCG/ACT NA SUSP
2.0000 | Freq: Every day | NASAL | Status: DC
  Administered 2018-01-30 – 2018-02-03 (×5): 2 via NASAL
  Filled 2018-01-30: qty 16

## 2018-01-30 MED ORDER — HYDROCODONE-HOMATROPINE 5-1.5 MG/5ML PO SYRP
5.0000 mL | ORAL_SOLUTION | Freq: Four times a day (QID) | ORAL | Status: DC
  Administered 2018-01-30 – 2018-02-03 (×13): 5 mL via ORAL
  Filled 2018-01-30 (×13): qty 5

## 2018-01-30 MED ORDER — MAGNESIUM OXIDE 400 (241.3 MG) MG PO TABS
800.0000 mg | ORAL_TABLET | Freq: Two times a day (BID) | ORAL | Status: DC
  Administered 2018-01-30 – 2018-02-03 (×8): 800 mg via ORAL
  Filled 2018-01-30 (×8): qty 2

## 2018-01-30 NOTE — Progress Notes (Signed)
[name] for Warfarin  Indication: atrial fibrillation  Allergies  Allergen Reactions  . Contrast Media [Iodinated Diagnostic Agents] Hives  . Levaquin [Levofloxacin] Itching and Rash  . Penicillins Rash    "haven't had it in years"  . Vancomycin Itching and Rash   Patient Measurements: Height: 5\' 5"  (165.1 cm) Weight: 163 lb 14.4 oz (74.3 kg) IBW/kg (Calculated) : 57  Vital Signs: Temp: 98.3 F (36.8 C) (02/18 0800) Temp Source: Axillary (02/18 0800) BP: 131/64 (02/18 0800) Pulse Rate: 79 (02/18 0900)  Labs: Recent Labs    01/28/18 1251  01/28/18 1429 01/28/18 2342 01/29/18 0421 01/29/18 1026 01/30/18 0511  HGB 14.2  --   --   --  12.9  --   --   HCT 40.9  --   --   --  37.2  --   --   PLT 268  --   --   --  242  --   --   LABPROT  --   --  29.4*  --  24.5*  --  25.1*  INR  --   --  2.81  --  2.23  --  2.29  CREATININE 0.82  --   --   --  0.70  --  0.72  TROPONINI  --    < >  --  <0.03 <0.03 <0.03  --    < > = values in this interval not displayed.    Estimated Creatinine Clearance: 43.4 mL/min (by C-G formula based on SCr of 0.72 mg/dL).   Assessment: 84 y/o F here with COPD exacerbation, on warfarin PTA for afib Warfarin PTA dose: 2.5 mg on Tues/Sat, 5 mg all other days CBC wnl, INR therapeutic, pt missed 2/16 dose.    Goal of Therapy:  INR 2-3 Monitor platelets by anticoagulation protocol: Yes   Plan:  Continue warfarin per home regimen Daily INR Monitor for bleeding  Thank you Anette Guarneri, PharmD [phone] 01/30/2018 10:30 AM

## 2018-01-30 NOTE — Progress Notes (Addendum)
Hospitalist progress note   [name]  EPP:[id number] DOB: 1921-05-16 DOA: 01/28/2018 PCP: [name], MD   Specialists:   Brief Narrative:   73 fem prior septic shock with cellulitis lower extremities on admission 2016, chronic A. fib AAA 3.4 cm 9/16 LV dysfunction 60-65% NYHA class II prior renal insufficiency, transitional cell Bladder ca s/p BCG 2012, small subarachnoid hem 2005, mild dementia diag c PNa 2/11 place don levaquin but no improve so came to ED was sob and wheezy and sore ribs-given nebulizers  found to have no PNa but mildly elevated HR and Sodium 130 on admit with Uosm and Usodium both decreased  Assessment & Plan:   Assessment:  The primary encounter diagnosis was Bronchitis with bronchospasm. Diagnoses of Dyspnea, unspecified type and Atypical chest pain were also pertinent to this visit.  Acute bronchitis without pneumonia-finished Rx antibiotics 2/17 CXR [-], checking BNP ensure not stated heart failure-continue prednisone 40 daily as has received Solu-Medrol-continue DuoNeb 3 times daily, Xopenex can continue Mucinex 600 twice daily Adding hycodan syrup 2/18  Chronic A. fib chads score >2 on Coumadin, INR therapeutic 2.2 not controlled heart rates 120s probably worsened by beta agonist- increased total dose to 360 discharge--cont Metoprolol 12.5 started 2/17 inr therapeutic  Diastolic heart failure, NYHA II aortic sclerosis EF 60-65% 2015-see above discussion.  Strict ins and outs, -880 and resume Lasix 20 daily as slight wheeze and bnp 200's  Bladder cancer status post TURBT-outpatient follow-up  Probable mild hypovolemic hyponatremia, sodium level and osmolality low leading me to think that this is possible volume excess-hold IV fluid  Improved sodium to 134  AAA last evaluated 08/2015 needs outpatient ultrasound repeat   DVT prophylaxis: Coumadin code Status:   Full   family Communication:   None bedside Disposition Plan: Inpatient-possible home  am   Consultants:   None  Procedures:   None  Antimicrobials:   Doxycycline 1 dose 2/17  Subjective:  Awake alert pleasant in nad but some paroxysm of cough No cp but rib pain Worked and walked with therapy  No other issue overall improved  Objective: Vitals:   01/30/18 0418 01/30/18 0800 01/30/18 0900 01/30/18 1200  BP: (!) 115/97 131/64  (!) 136/59  Pulse: 82 84 79 91  Resp: 16 17 20  (!) 21  Temp: 98.1 F (36.7 C) 98.3 F (36.8 C)    TempSrc: Oral Axillary    SpO2: 94% 94% 96% 91%  Weight: 74.3 kg (163 lb 14.4 oz)     Height:        Intake/Output Summary (Last 24 hours) at 01/30/2018 1459 Last data filed at 01/30/2018 0800 Gross per 24 hour  Intake 970 ml  Output 1800 ml  Net -830 ml   Filed Weights   01/28/18 2258 01/29/18 0032 01/30/18 0418  Weight: 73.2 kg (161 lb 4.8 oz) 73 kg (161 lb) 74.3 kg (163 lb 14.4 oz)    Examination:  Alert oriented pleasant no distress Wheeze bilaterally posteriorly no crepitations no rhonchi Abdomen soft no rebound no guarding S1-S2 tachycardic telemetry shows A. fib rates 110-130 Neurologically intact looking much younger than stated age moving all 4 limbs equally Euthymic and pleasant  Data Reviewed: I have personally reviewed following labs and imaging studies  CBC: Recent Labs  Lab 01/28/18 1251 01/29/18 0421  WBC 8.8 8.4  NEUTROABS 5.4  --   HGB 14.2 12.9  HCT 40.9 37.2  MCV 95.3 94.4  PLT 268 606   Basic Metabolic Panel: Recent Labs  Lab 01/28/18 1251 01/29/18 0421 01/30/18 0511  NA 130* 132* 134*  K 3.5 3.1* 3.5  CL 92* 97* 98*  CO2 25 23 25   GLUCOSE 115* 195* 133*  BUN 8 9 12   CREATININE 0.82 0.70 0.72  CALCIUM 9.2 9.0 8.9  MG  --  1.7 1.6*  PHOS  --  2.7  --    GFR: Estimated Creatinine Clearance: 43.4 mL/min (by C-G formula based on SCr of 0.72 mg/dL). Liver Function Tests: Recent Labs  Lab 01/28/18 1251 01/29/18 0421 01/30/18 0511  AST 26 24 34  ALT 22 20 26   ALKPHOS 80 67 61   BILITOT 0.7 0.8 0.6  PROT 7.3 6.4* 5.8*  ALBUMIN 3.8 3.4* 3.1*   No results for input(s): LIPASE, AMYLASE in the last 168 hours. No results for input(s): AMMONIA in the last 168 hours. Coagulation Profile: Recent Labs  Lab 01/28/18 1429 01/29/18 0421 01/30/18 0511  INR 2.81 2.23 2.29   Cardiac Enzymes: Recent Labs  Lab 01/28/18 1411 01/28/18 2342 01/29/18 0421 01/29/18 1026  TROPONINI <0.03 <0.03 <0.03 <0.03   CBG: No results for input(s): GLUCAP in the last 168 hours. Urine analysis:    Component Value Date/Time   COLORURINE YELLOW 08/14/2015 0057   APPEARANCEUR CLEAR 08/14/2015 0057   LABSPEC 1.010 08/14/2015 0057   PHURINE 7.5 08/14/2015 0057   GLUCOSEU NEGATIVE 08/14/2015 0057   HGBUR LARGE (A) 08/14/2015 0057   BILIRUBINUR NEGATIVE 08/14/2015 0057   KETONESUR NEGATIVE 08/14/2015 0057   PROTEINUR 30 (A) 08/14/2015 0057   UROBILINOGEN 0.2 08/14/2015 0057   NITRITE NEGATIVE 08/14/2015 0057   LEUKOCYTESUR NEGATIVE 08/14/2015 0057     Radiology Studies: Reviewed images personally in health database    Scheduled Meds: . atorvastatin  10 mg Oral Daily  . diltiazem  360 mg Oral Daily  . fluticasone  2 spray Each Nare Daily  . furosemide  20 mg Oral Daily  . guaiFENesin  600 mg Oral BID  . HYDROcodone-homatropine  5 mL Oral Q6H WA  . metoprolol succinate  12.5 mg Oral Daily  . potassium chloride  40 mEq Oral Daily  . predniSONE  40 mg Oral Q supper  . spironolactone  25 mg Oral Daily  . [START ON 01/31/2018] warfarin  2.5 mg Oral Once per day on Tue Sat  . warfarin  5 mg Oral Once per day on Sun Mon Wed Thu Fri  . Warfarin - Pharmacist Dosing Inpatient   Does not apply q1800   Continuous Infusions:    LOS: 2 days    Time spent: [place], MD [facility] ([facility]   If 7PM-7AM, please contact night-coverage [email or web address] Password [facility] 01/30/2018, 2:59 PM

## 2018-01-30 NOTE — Evaluation (Signed)
Physical Therapy Evaluation Patient Details Name: [name] MRN: [id number] DOB: March 16, 1921 Today's Date: 01/30/2018   History of Present Illness  pt is a 84 y/o female with pmh significant for bladder CA, afib, AAA, HTN, presenting with 2 week h/o SOB and wheezing.  PCP dx'd with PNA, but has not improved with treatment.  Workup showed bronchitis, pt's HR now uncer control.  Clinical Impression  Pt is at or close to baseline functioning and should be safe at home with available family assist. There are no further acute PT needs.  Will sign off at this time.     Follow Up Recommendations No PT follow up    Equipment Recommendations  None recommended by PT    Recommendations for Other Services       Precautions / Restrictions Precautions Precautions: Fall      Mobility  Bed Mobility Overal bed mobility: Independent                Transfers Overall transfer level: Needs assistance   Transfers: Sit to/from Stand Sit to Stand: Modified independent (Device/Increase time)            Ambulation/Gait Ambulation/Gait assistance: Supervision Ambulation Distance (Feet): 300 Feet Assistive device: None Gait Pattern/deviations: Step-through pattern   Gait velocity interpretation: at or above normal speed for age/gender General Gait Details: generally steady.  Some antalgia/gimpiness on left leg  HR held to 105 bpm.  Sats at 98-100 on RA during gait.  Stairs            Wheelchair Mobility    Modified Rankin (Stroke Patients Only)       Balance                                             Pertinent Vitals/Pain      Home Living Family/patient expects to be discharged to:: Private residence Living Arrangements: Alone;Children Available Help at Discharge: Family;Available PRN/intermittently(as much as pt needs while sick) Type of Home: House Home Access: Ramped entrance     Home Layout: One level Home Equipment: Bowlus -  single point;Grab bars - tub/shower(chair arms connected to the toilet.)      Prior Function Level of Independence: Independent         Comments: 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     Hand Dominance        Extremity/Trunk Assessment   Upper Extremity Assessment Upper Extremity Assessment: Defer to OT evaluation    Lower Extremity Assessment Lower Extremity Assessment: Overall WFL for tasks assessed(proximal/truncal weakness bil)       Communication   Communication: No difficulties  Cognition Arousal/Alertness: Awake/alert Behavior During Therapy: WFL for tasks assessed/performed Overall Cognitive Status: Within Functional Limits for tasks assessed                                        General Comments      Exercises     Assessment/Plan    PT Assessment Patent does not need any further PT services  PT Problem List Decreased strength;Decreased activity tolerance;Cardiopulmonary status limiting activity       PT Treatment Interventions      PT Goals (Current goals can be found in the Care Plan section)  Acute Rehab PT  Goals PT Goal Formulation: All assessment and education complete, DC therapy    Frequency     Barriers to discharge        Co-evaluation               AM-PAC PT "6 Clicks" Daily Activity  Outcome Measure Difficulty turning over in bed (including adjusting bedclothes, sheets and blankets)?: None Difficulty moving from lying on back to sitting on the side of the bed? : None Difficulty sitting down on and standing up from a chair with arms (e.g., wheelchair, bedside commode, etc,.)?: None Help needed moving to and from a bed to chair (including a wheelchair)?: None Help needed walking in hospital room?: None Help needed climbing 3-5 steps with a railing? : None 6 Click Score: 24    End of Session   Activity Tolerance: Patient tolerated treatment well Patient left: in bed Nurse Communication:  Mobility status PT Visit Diagnosis: Difficulty in walking, not elsewhere classified (R26.2)    Time: 4782-9562 PT Time Calculation (min) (ACUTE ONLY): 29 min   Charges:   PT Evaluation $PT Eval Low Complexity: 1 Low PT Treatments $Gait Training: 8-22 mins   PT G Codes:        02-18-18  Donnella Sham, PT [phone]  (pager)  Tessie Fass  2018/02/18, 12:16 PM

## 2018-01-30 NOTE — Progress Notes (Signed)
Nutrition Brief Note  Received MD Consult per COPD protocol. Patient and her daughter report that patient has been eating very well. Weight has been stable. No nutrition concerns.   Wt Readings from Last 15 Encounters:  01/30/18 163 lb 14.4 oz (74.3 kg)  12/16/17 168 lb (76.2 kg)  01/15/16 166 lb (75.3 kg)  11/17/15 166 lb (75.3 kg)  10/01/15 166 lb (75.3 kg)  09/02/15 166 lb 4.8 oz (75.4 kg)  08/18/15 143 lb 4.8 oz (65 kg)  06/24/15 162 lb (73.5 kg)  06/11/15 161 lb (73 kg)  06/09/15 162 lb (73.5 kg)  06/06/15 160 lb (72.6 kg)  06/05/15 162 lb (73.5 kg)  12/17/14 164 lb 9.6 oz (74.7 kg)  04/09/14 164 lb (74.4 kg)  02/26/14 163 lb 6.4 oz (74.1 kg)    Body mass index is 27.27 kg/m. Patient meets criteria for overweight based on current BMI.   Current diet order is regular, patient is consuming approximately 100% of meals at this time. Labs and medications reviewed.   No nutrition interventions warranted at this time. If nutrition issues arise, please consult RD.   Molli Barrows, RD, LDN, [place] Pager [phone] After Hours Pager [phone]

## 2018-01-30 NOTE — Progress Notes (Signed)
Occupational Therapy Evaluation Patient Details Name: [name] MRN: [id number] DOB: 11-23-1921 Today's Date: 01/30/2018    History of Present Illness Pt is a 84 y/o female with pmh significant for bladder CA, afib, AAA, HTN, presenting with 2 week h/o SOB and wheezing.  PCP dx'd with PNA, but has not improved with treatment.  Workup showed bronchitis, pt's HR now under control.   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 Pt currently requires overall supervision for standing and ambulating ADL. She is slightly unsteady on her feet but likely close to baseline. Pt would benefit from additional OT session for further education concerning energy conservation strategies as well as tub transfer safety education. Do not anticipate need for OT follow-up post-acute D/C.     Follow Up Recommendations  No OT follow up;Supervision - Intermittent    Equipment Recommendations  None recommended by OT    Recommendations for Other Services       Precautions / Restrictions Precautions Precautions: Fall Restrictions Weight Bearing Restrictions: No      Mobility Bed Mobility Overal bed mobility: Independent                Transfers Overall transfer level: Needs assistance   Transfers: Sit to/from Stand Sit to Stand: Supervision         General transfer comment: for safety    Balance Overall balance assessment: Mild deficits observed, not formally tested                                         ADL either performed or assessed with clinical judgement   ADL Overall ADL's : Needs assistance/impaired Eating/Feeding: Modified independent   Grooming: Supervision/safety;Standing   Upper Body Bathing: Modified independent   Lower Body Bathing: Supervison/ safety;Sit to/from stand   Upper Body Dressing : Modified independent   Lower Body Dressing:  Supervision/safety;Sit to/from stand   Toilet Transfer: Supervision/safety;Ambulation   Toileting- Clothing Manipulation and Hygiene: Supervision/safety;Sit to/from stand       Functional mobility during ADLs: Supervision/safety General ADL Comments: Supervision for safety once pt on feet. Likely close to baseline.      Vision Patient Visual Report: No change from baseline Vision Assessment?: No apparent visual deficits     Perception     Praxis      Pertinent Vitals/Pain       Hand Dominance     Extremity/Trunk Assessment Upper Extremity Assessment Upper Extremity Assessment: Overall WFL for tasks assessed   Lower Extremity Assessment Lower Extremity Assessment: Defer to PT evaluation       Communication Communication Communication: No difficulties   Cognition Arousal/Alertness: Awake/alert Behavior During Therapy: WFL for tasks assessed/performed Overall Cognitive Status: Within Functional Limits for tasks assessed                                     General Comments  HR up to 102 during session    Exercises     Shoulder Instructions      Home Living Family/patient expects to be discharged to:: Private residence Living Arrangements: Alone;Children Available Help at Discharge: Family;Available PRN/intermittently(as much as pt needs while sick) Type of Home: House Home Access: Ramped entrance     Home Layout: One  level     Bathroom Shower/Tub: Tub/shower unit         Home Equipment: Cane - single point;Grab bars - tub/shower(chair arms connected to the toilet.)          Prior Functioning/Environment Level of Independence: Independent        Comments: 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        OT Problem List: Decreased activity tolerance;Impaired balance (sitting and/or standing);Decreased safety awareness;Decreased knowledge of use of DME or AE      OT Treatment/Interventions: Self-care/ADL  training;Therapeutic exercise;Energy conservation;DME and/or AE instruction;Manual therapy;Therapeutic activities;Patient/family education;Balance training    OT Goals(Current goals can be found in the care plan section) Acute Rehab OT Goals Patient Stated Goal: get back to volunteering at the center OT Goal Formulation: With patient/family Time For Goal Achievement: 02/13/18 Potential to Achieve Goals: Good ADL Goals Pt Will Perform Tub/Shower Transfer: Tub transfer;grab bars Additional ADL Goal #1: Pt will independently verbalize 3 strategies to conserve energy during morning ADL routine.  OT Frequency: Min 2X/week   Barriers to D/C:            Co-evaluation              AM-PAC PT "6 Clicks" Daily Activity     Outcome Measure Help from another person eating meals?: None Help from another person taking care of personal grooming?: A Little Help from another person toileting, which includes using toliet, bedpan, or urinal?: A Little Help from another person bathing (including washing, rinsing, drying)?: A Little Help from another person to put on and taking off regular upper body clothing?: None Help from another person to put on and taking off regular lower body clothing?: A Little 6 Click Score: 20   End of Session    Activity Tolerance: Patient tolerated treatment well Patient left: in bed;with call bell/phone within reach;with family/visitor present  OT Visit Diagnosis: Other abnormalities of gait and mobility (R26.89)                Time: 1937-9024 OT Time Calculation (min): 18 min Charges:  OT General Charges $OT Visit: 1 Visit OT Evaluation $OT Eval Moderate Complexity: 1 Mod G-Codes:     Norman Herrlich, MS OTR/L  Pager: [place] [facility]  01/30/2018, 1:27 PM

## 2018-01-31 ENCOUNTER — Inpatient Hospital Stay (HOSPITAL_COMMUNITY): Payer: PPO

## 2018-01-31 LAB — CBC WITH DIFFERENTIAL/PLATELET
BASOS PCT: 0 %
Basophils Absolute: 0 10*3/uL (ref 0.0–0.1)
EOS PCT: 0 %
Eosinophils Absolute: 0 10*3/uL (ref 0.0–0.7)
HCT: 39.8 % (ref 36.0–46.0)
HEMOGLOBIN: 13.6 g/dL (ref 12.0–15.0)
Lymphocytes Relative: 14 %
Lymphs Abs: 1.5 10*3/uL (ref 0.7–4.0)
MCH: 33.1 pg (ref 26.0–34.0)
MCHC: 34.2 g/dL (ref 30.0–36.0)
MCV: 96.8 fL (ref 78.0–100.0)
MONOS PCT: 4 %
Monocytes Absolute: 0.4 10*3/uL (ref 0.1–1.0)
NEUTROS PCT: 82 %
Neutro Abs: 9.3 10*3/uL — ABNORMAL HIGH (ref 1.7–7.7)
PLATELETS: 267 10*3/uL (ref 150–400)
RBC: 4.11 MIL/uL (ref 3.87–5.11)
RDW: 13.5 % (ref 11.5–15.5)
WBC: 11.2 10*3/uL — AB (ref 4.0–10.5)

## 2018-01-31 LAB — COMPREHENSIVE METABOLIC PANEL
ALK PHOS: 62 U/L (ref 38–126)
ALT: 34 U/L (ref 14–54)
AST: 37 U/L (ref 15–41)
Albumin: 3.3 g/dL — ABNORMAL LOW (ref 3.5–5.0)
Anion gap: 13 (ref 5–15)
BUN: 17 mg/dL (ref 6–20)
CALCIUM: 8.8 mg/dL — AB (ref 8.9–10.3)
CO2: 23 mmol/L (ref 22–32)
CREATININE: 0.78 mg/dL (ref 0.44–1.00)
Chloride: 97 mmol/L — ABNORMAL LOW (ref 101–111)
Glucose, Bld: 166 mg/dL — ABNORMAL HIGH (ref 65–99)
Potassium: 4.4 mmol/L (ref 3.5–5.1)
Sodium: 133 mmol/L — ABNORMAL LOW (ref 135–145)
Total Bilirubin: 0.6 mg/dL (ref 0.3–1.2)
Total Protein: 6.3 g/dL — ABNORMAL LOW (ref 6.5–8.1)

## 2018-01-31 LAB — MAGNESIUM: Magnesium: 1.9 mg/dL (ref 1.7–2.4)

## 2018-01-31 LAB — PROTIME-INR
INR: 2.04
Prothrombin Time: 22.8 seconds — ABNORMAL HIGH (ref 11.4–15.2)

## 2018-01-31 MED ORDER — WARFARIN SODIUM 2.5 MG PO TABS: 2.5000 mg | ORAL_TABLET | ORAL | Status: DC

## 2018-01-31 MED ORDER — ALUM & MAG HYDROXIDE-SIMETH 200-200-20 MG/5ML PO SUSP
30.0000 mL | ORAL | Status: DC | PRN
  Administered 2018-01-31: 30 mL via ORAL
  Filled 2018-01-31: qty 30

## 2018-01-31 MED ORDER — SODIUM CHLORIDE 0.9 % IV SOLN
1.5000 g | Freq: Four times a day (QID) | INTRAVENOUS | Status: DC
  Administered 2018-01-31 – 2018-02-03 (×12): 1.5 g via INTRAVENOUS
  Filled 2018-01-31 (×14): qty 1.5

## 2018-01-31 MED ORDER — WARFARIN SODIUM 5 MG PO TABS
5.0000 mg | ORAL_TABLET | Freq: Once | ORAL | Status: AC
  Administered 2018-01-31: 5 mg via ORAL
  Filled 2018-01-31: qty 1

## 2018-01-31 NOTE — Progress Notes (Signed)
Hospitalist progress note   [name]  WPY:[id number] DOB: 1921-11-28 DOA: 01/28/2018 PCP: [name], MD   Specialists:   Brief Narrative:   45 fem prior septic shock with cellulitis lower extremities on admission 2016, chronic A. fib AAA 3.4 cm 9/16 LV dysfunction 60-65% NYHA class II prior renal insufficiency, transitional cell Bladder ca s/p BCG 2012, small subarachnoid hem 2005, mild dementia diag c PNa 2/11 place don levaquin but no improve so came to ED was sob and wheezy and sore ribs-given nebulizers  found to have no PNa but mildly elevated HR and Sodium 130 on admit with Uosm and Usodium both decreased  Assessment & Plan:   Assessment:  The primary encounter diagnosis was Bronchitis with bronchospasm. Diagnoses of Dyspnea, unspecified type, Atypical chest pain, and PNA (pneumonia) were also pertinent to this visit.  Acute bronchitis without pneumonia-finished Rx antibiotics 2/17 CXR [-], checking BNP ensure not stated heart failure-continue prednisone 40 daily as has received Solu-Medrol-continue DuoNeb 3 times daily, Xopenex can continue Mucinex 600 twice daily Adding hycodan syrup 2/18  Repeat chest x-ray surprisingly 2/19 shows pneumonia in the left costochondral angle so we will treat with Unasyn and although it is unlikely I would like speech therapy to see and clarify if she has aspirated any May require CT scan of chest given this recurrent pneumonia if not clearing and a consult with pulmonary  Chronic A. fib chads score >2 on Coumadin, INR therapeutic 2.2 not controlled heart rates 120s probably worsened by beta agonist- increased total dose to 360 discharge--cont Metoprolol 12.5 started 2/17 inr therapeutic He has had some 2.1-second pauses but I feel her rate is much better controlled overall and as long as these pauses were not above 3 seconds feel the benefit of good rate control outweighs the risk  Diastolic heart failure, NYHA II aortic sclerosis EF  60-65% 2015-see above discussion.  Strict ins and outs, -880 and resume Lasix 20 daily as slight wheeze and bnp 200's  Bladder cancer status post TURBT-outpatient follow-up  Probable mild hypovolemic hyponatremia, sodium level and osmolality low leading me to think that this is possible volume excess-hold IV fluid  Improved sodium to 134  AAA last evaluated 08/2015 needs outpatient ultrasound repeat   DVT prophylaxis: Coumadin code Status:   Full   family Communication:   None bedside Disposition Plan: Inpatient-possible home am   Consultants:   None  Procedures:   None  Antimicrobials:   Doxycycline 1 dose 2/17  Subjective:  Still coughing cannot get anything up No fever no chills but just does not feel right Requesting chest x-ray  Objective: Vitals:   01/31/18 0500 01/31/18 0853 01/31/18 0900 01/31/18 1434  BP: 121/73 (!) 128/56 (!) 128/56 127/61  Pulse: 75 69 76 69  Resp: (!) 24 20 (!) 28 18  Temp: 98.2 F (36.8 C)   (!) 97.4 F (36.3 C)  TempSrc: Oral   Oral  SpO2: 100% 95% 96% 92%  Weight: 74.7 kg (164 lb 11.2 oz)     Height:        Intake/Output Summary (Last 24 hours) at 01/31/2018 1617 Last data filed at 01/31/2018 1342 Gross per 24 hour  Intake 1060 ml  Output 1300 ml  Net -240 ml   Filed Weights   01/29/18 0032 01/30/18 0418 01/31/18 0500  Weight: 73 kg (161 lb) 74.3 kg (163 lb 14.4 oz) 74.7 kg (164 lb 11.2 oz)    Examination:  Alert oriented pleasant no distress Slight decreased  air entry left side No rales no rhonchi although some mild scattered crackles S1-S2 no murmur on telemetry 2.1-second pause Abdomen soft nontender Mild lower extremity edema Scant JVD  Data Reviewed: I have personally reviewed following labs and imaging studies  CBC: Recent Labs  Lab 01/28/18 1251 01/29/18 0421 01/31/18 0343  WBC 8.8 8.4 11.2*  NEUTROABS 5.4  --  9.3*  HGB 14.2 12.9 13.6  HCT 40.9 37.2 39.8  MCV 95.3 94.4 96.8  PLT 268 242 629    Basic Metabolic Panel: Recent Labs  Lab 01/28/18 1251 01/29/18 0421 01/30/18 0511 01/31/18 0343  NA 130* 132* 134* 133*  K 3.5 3.1* 3.5 4.4  CL 92* 97* 98* 97*  CO2 25 23 25 23   GLUCOSE 115* 195* 133* 166*  BUN 8 9 12 17   CREATININE 0.82 0.70 0.72 0.78  CALCIUM 9.2 9.0 8.9 8.8*  MG  --  1.7 1.6* 1.9  PHOS  --  2.7  --   --    GFR: Estimated Creatinine Clearance: 43.5 mL/min (by C-G formula based on SCr of 0.78 mg/dL). Liver Function Tests: Recent Labs  Lab 01/28/18 1251 01/29/18 0421 01/30/18 0511 01/31/18 0343  AST 26 24 34 37  ALT 22 20 26  34  ALKPHOS 80 67 61 62  BILITOT 0.7 0.8 0.6 0.6  PROT 7.3 6.4* 5.8* 6.3*  ALBUMIN 3.8 3.4* 3.1* 3.3*   No results for input(s): LIPASE, AMYLASE in the last 168 hours. No results for input(s): AMMONIA in the last 168 hours. Coagulation Profile: Recent Labs  Lab 01/28/18 1429 01/29/18 0421 01/30/18 0511 01/31/18 0343  INR 2.81 2.23 2.29 2.04   Cardiac Enzymes: Recent Labs  Lab 01/28/18 1411 01/28/18 2342 01/29/18 0421 01/29/18 1026  TROPONINI <0.03 <0.03 <0.03 <0.03   CBG: No results for input(s): GLUCAP in the last 168 hours. Urine analysis:    Component Value Date/Time   COLORURINE YELLOW 08/14/2015 0057   APPEARANCEUR CLEAR 08/14/2015 0057   LABSPEC 1.010 08/14/2015 0057   PHURINE 7.5 08/14/2015 0057   GLUCOSEU NEGATIVE 08/14/2015 0057   HGBUR LARGE (A) 08/14/2015 0057   BILIRUBINUR NEGATIVE 08/14/2015 0057   KETONESUR NEGATIVE 08/14/2015 0057   PROTEINUR 30 (A) 08/14/2015 0057   UROBILINOGEN 0.2 08/14/2015 0057   NITRITE NEGATIVE 08/14/2015 0057   LEUKOCYTESUR NEGATIVE 08/14/2015 0057     Radiology Studies: Reviewed images personally in health database    Scheduled Meds: . atorvastatin  10 mg Oral Daily  . diltiazem  360 mg Oral Daily  . fluticasone  2 spray Each Nare Daily  . furosemide  20 mg Oral Daily  . guaiFENesin  600 mg Oral BID  . HYDROcodone-homatropine  5 mL Oral Q6H WA  .  magnesium oxide  800 mg Oral BID  . metoprolol succinate  12.5 mg Oral Daily  . potassium chloride  40 mEq Oral Daily  . predniSONE  40 mg Oral Q supper  . spironolactone  25 mg Oral Daily  . [START ON 02/04/2018] warfarin  2.5 mg Oral Once per day on Tue Sat  . warfarin  5 mg Oral Once per day on Sun Mon Wed Thu Fri  . warfarin  5 mg Oral ONCE-1800  . Warfarin - Pharmacist Dosing Inpatient   Does not apply q1800   Continuous Infusions: . ampicillin-sulbactam (UNASYN) IV Stopped (01/31/18 1342)     LOS: 3 days    Time spent: [place], MD [facility] [phone]   If  7PM-7AM, please contact night-coverage [email or web address] Password [facility] 01/31/2018, 4:17 PM

## 2018-01-31 NOTE — Progress Notes (Signed)
[place] for Warfarin  Indication: atrial fibrillation  Allergies  Allergen Reactions  . Contrast Media [Iodinated Diagnostic Agents] Hives  . Levaquin [Levofloxacin] Itching and Rash  . Penicillins Rash    "haven't had it in years"  . Vancomycin Itching and Rash   Patient Measurements: Height: 5\' 5"  (165.1 cm) Weight: 164 lb 11.2 oz (74.7 kg) IBW/kg (Calculated) : 57  Vital Signs: Temp: 98.2 F (36.8 C) (02/19 0500) Temp Source: Oral (02/19 0500) BP: 121/73 (02/19 0500) Pulse Rate: 75 (02/19 0500)  Labs: Recent Labs    01/28/18 1251  01/28/18 2342 01/29/18 0421 01/29/18 1026 01/30/18 0511 01/31/18 0343  HGB 14.2  --   --  12.9  --   --  13.6  HCT 40.9  --   --  37.2  --   --  39.8  PLT 268  --   --  242  --   --  267  LABPROT  --    < >  --  24.5*  --  25.1* 22.8*  INR  --    < >  --  2.23  --  2.29 2.04  CREATININE 0.82  --   --  0.70  --  0.72 0.78  TROPONINI  --    < > <0.03 <0.03 <0.03  --   --    < > = values in this interval not displayed.    Estimated Creatinine Clearance: 43.5 mL/min (by C-G formula based on SCr of 0.78 mg/dL).   Assessment: 84 y/o F here with COPD exacerbation, on warfarin PTA for afib Warfarin PTA dose: 2.5 mg on Tues/Sat, 5 mg all other days  CBC stable INR = 2.04 - on low end  Goal of Therapy:  INR 2-3 Monitor platelets by anticoagulation protocol: Yes   Plan:  Warfarin 5 mg po x 1 (off home regimen due to missed dose 2/16) Daily INR Monitor for bleeding  Thank you Anette Guarneri, PharmD [phone] 01/31/2018 8:45 AM

## 2018-01-31 NOTE — Progress Notes (Signed)
Pt had 2.1 sec slow ventricular response. Strip saved in Epic. Will continue to monitor. Jessie Foot, RN

## 2018-02-01 DIAGNOSIS — C679 Malignant neoplasm of bladder, unspecified: Secondary | ICD-10-CM

## 2018-02-01 DIAGNOSIS — I4891 Unspecified atrial fibrillation: Secondary | ICD-10-CM

## 2018-02-01 DIAGNOSIS — J209 Acute bronchitis, unspecified: Secondary | ICD-10-CM

## 2018-02-01 DIAGNOSIS — I482 Chronic atrial fibrillation: Secondary | ICD-10-CM

## 2018-02-01 DIAGNOSIS — R0789 Other chest pain: Secondary | ICD-10-CM

## 2018-02-01 LAB — CBC WITH DIFFERENTIAL/PLATELET
BASOS PCT: 0 %
Basophils Absolute: 0 10*3/uL (ref 0.0–0.1)
Eosinophils Absolute: 0.1 10*3/uL (ref 0.0–0.7)
Eosinophils Relative: 1 %
HEMATOCRIT: 41.5 % (ref 36.0–46.0)
Hemoglobin: 13.8 g/dL (ref 12.0–15.0)
Lymphocytes Relative: 15 %
Lymphs Abs: 1.8 10*3/uL (ref 0.7–4.0)
MCH: 33.3 pg (ref 26.0–34.0)
MCHC: 33.3 g/dL (ref 30.0–36.0)
MCV: 100 fL (ref 78.0–100.0)
MONO ABS: 0.3 10*3/uL (ref 0.1–1.0)
MONOS PCT: 3 %
NEUTROS ABS: 10.2 10*3/uL — AB (ref 1.7–7.7)
Neutrophils Relative %: 81 %
Platelets: 304 10*3/uL (ref 150–400)
RBC: 4.15 MIL/uL (ref 3.87–5.11)
RDW: 13.8 % (ref 11.5–15.5)
WBC: 12.5 10*3/uL — ABNORMAL HIGH (ref 4.0–10.5)

## 2018-02-01 LAB — RENAL FUNCTION PANEL
ANION GAP: 10 (ref 5–15)
Albumin: 3.3 g/dL — ABNORMAL LOW (ref 3.5–5.0)
BUN: 17 mg/dL (ref 6–20)
CALCIUM: 8.7 mg/dL — AB (ref 8.9–10.3)
CO2: 25 mmol/L (ref 22–32)
Chloride: 94 mmol/L — ABNORMAL LOW (ref 101–111)
Creatinine, Ser: 0.85 mg/dL (ref 0.44–1.00)
GFR calc Af Amer: >60 mL/min (ref >60)
GFR calc non Af Amer: 57 mL/min — ABNORMAL LOW (ref >60)
GLUCOSE: 160 mg/dL — AB (ref 65–99)
Phosphorus: 3.3 mg/dL (ref 2.5–4.6)
Potassium: 4.9 mmol/L (ref 3.5–5.1)
SODIUM: 129 mmol/L — AB (ref 135–145)

## 2018-02-01 LAB — PROTIME-INR
INR: 1.85
Prothrombin Time: 21.2 seconds — ABNORMAL HIGH (ref 11.4–15.2)

## 2018-02-01 MED ORDER — HYDROXYZINE HCL 10 MG PO TABS
10.0000 mg | ORAL_TABLET | Freq: Three times a day (TID) | ORAL | Status: DC | PRN
  Administered 2018-02-01 – 2018-02-03 (×3): 10 mg via ORAL
  Filled 2018-02-01 (×3): qty 1

## 2018-02-01 MED ORDER — WARFARIN SODIUM 5 MG PO TABS
6.0000 mg | ORAL_TABLET | Freq: Once | ORAL | Status: AC
  Administered 2018-02-01: 6 mg via ORAL
  Filled 2018-02-01: qty 1

## 2018-02-01 NOTE — Care Management Important Message (Signed)
Important Message  Patient Details  Name: [name] MRN: [id number] Date of Birth: November 08, 1921   Medicare Important Message Given:  Yes    Orbie Pyo 02/01/2018, 1:52 PM

## 2018-02-01 NOTE — Progress Notes (Signed)
PROGRESS NOTE  [name] ION:[id number] DOB: [birth year] DOA: 01/28/2018 PCP: [name], MD   LOS: 4 days   Brief Narrative / Interim history: 84 year old female with history of bladder cancer, A. fib on Coumadin, AAA, hypertension, GERD, who comes to the hospital with couple weeks of shortness of breath as well as wheezing.  She presented to the PCP prior to this admission, was diagnosed with pneumonia and started on Levaquin.  But she failed to improve on Levaquin and presented to the hospital and was admitted on 2/16.  Assessment & Plan: Active Problems:   Hypertension   GERD (gastroesophageal reflux disease)   Bladder cancer (HCC)   Chronic atrial fibrillation (HCC)   Hyponatremia   Left ventricular diastolic dysfunction, NYHA class 2   Reactive airway disease with acute exacerbation   Atrial fibrillation with RVR (HCC)   Left lower lobe pneumonia -Patient admitted with pneumonia/bronchitis as well as reactive airway disease with significant wheezing -Continue prednisone, nebulizers, cough suppressants -Continue antibiotics with Unasyn  Chronic A. fib -Continue Coumadin, rate control with metoprolol and Cardizem  Chronic diastolic heart failure -Most recent 2D echo with a EF of 60-65% -Continue Lasix  Bladder cancer  -status post TURBT -outpatient follow-up  Hyponatremia -Initially thought to be hypovolemic, responded to fluids however looks a little bit fluid overloaded today, resume Lasix  AAA  -last evaluated 08/2015 needs outpatient ultrasound repeat    DVT prophylaxis: Coumadin  Code Status: Full code Family Communication: no family at bedside Disposition Plan: home when readt   Consultants:   None   Procedures:   None   Antimicrobials:  Doxycycline 2/17-2/18  Unasyn 2/19  Subjective: - no chest pain, no abdominal pain, nausea or vomiting.  Shortness of breath has improved.  Objective: Vitals:   01/31/18 1900 02/01/18 0100  02/01/18 0357 02/01/18 0836  BP: 100/78 (!) 116/54 (!) 108/59 107/61  Pulse: 88 98 94 75  Resp: 20 18 16    Temp: (!) 97.4 F (36.3 C) 97.9 F (36.6 C) 97.6 F (36.4 C)   TempSrc: Oral Oral Oral   SpO2: 99%     Weight:   76.8 kg (169 lb 4.8 oz)   Height:        Intake/Output Summary (Last 24 hours) at 02/01/2018 1512 Last data filed at 02/01/2018 1300 Gross per 24 hour  Intake 1620 ml  Output 1000 ml  Net 620 ml   Filed Weights   01/30/18 0418 01/31/18 0500 02/01/18 0357  Weight: 74.3 kg (163 lb 14.4 oz) 74.7 kg (164 lb 11.2 oz) 76.8 kg (169 lb 4.8 oz)    Examination:  Constitutional: NAD Eyes:  lids and conjunctivae normal ENMT: Mucous membranes are moist.  Neck: normal, supple Respiratory: clear to auscultation bilaterally, no wheezing, no crackles. Normal respiratory effort. No accessory muscle use.  Cardiovascular: Irregular, no murmurs.  Trace lower extremity edema. Abdomen: no tenderness.  Skin: no rashes Neurologic: non focal   Data Reviewed: I have independently reviewed following labs and imaging studies   CBC: Recent Labs  Lab 01/28/18 1251 01/29/18 0421 01/31/18 0343 02/01/18 0357  WBC 8.8 8.4 11.2* 12.5*  NEUTROABS 5.4  --  9.3* 10.2*  HGB 14.2 12.9 13.6 13.8  HCT 40.9 37.2 39.8 41.5  MCV 95.3 94.4 96.8 100.0  PLT 268 242 267 244   Basic Metabolic Panel: Recent Labs  Lab 01/28/18 1251 01/29/18 0421 01/30/18 0511 01/31/18 0343 02/01/18 0357  NA 130* 132* 134* 133* 129*  K 3.5 3.1*  3.5 4.4 4.9  CL 92* 97* 98* 97* 94*  CO2 25 23 25 23 25   GLUCOSE 115* 195* 133* 166* 160*  BUN 8 9 12 17 17   CREATININE 0.82 0.70 0.72 0.78 0.85  CALCIUM 9.2 9.0 8.9 8.8* 8.7*  MG  --  1.7 1.6* 1.9  --   PHOS  --  2.7  --   --  3.3   GFR: Estimated Creatinine Clearance: 41.5 mL/min (by C-G formula based on SCr of 0.85 mg/dL). Liver Function Tests: Recent Labs  Lab 01/28/18 1251 01/29/18 0421 01/30/18 0511 01/31/18 0343 02/01/18 0357  AST 26 24 34 37   --   ALT 22 20 26  34  --   ALKPHOS 80 67 61 62  --   BILITOT 0.7 0.8 0.6 0.6  --   PROT 7.3 6.4* 5.8* 6.3*  --   ALBUMIN 3.8 3.4* 3.1* 3.3* 3.3*   No results for input(s): LIPASE, AMYLASE in the last 168 hours. No results for input(s): AMMONIA in the last 168 hours. Coagulation Profile: Recent Labs  Lab 01/28/18 1429 01/29/18 0421 01/30/18 0511 01/31/18 0343 02/01/18 0357  INR 2.81 2.23 2.29 2.04 1.85   Cardiac Enzymes: Recent Labs  Lab 01/28/18 1411 01/28/18 2342 01/29/18 0421 01/29/18 1026  TROPONINI <0.03 <0.03 <0.03 <0.03   BNP (last 3 results) No results for input(s): PROBNP in the last 8760 hours. HbA1C: No results for input(s): HGBA1C in the last 72 hours. CBG: No results for input(s): GLUCAP in the last 168 hours. Lipid Profile: No results for input(s): CHOL, HDL, LDLCALC, TRIG, CHOLHDL, LDLDIRECT in the last 72 hours. Thyroid Function Tests: No results for input(s): TSH, T4TOTAL, FREET4, T3FREE, THYROIDAB in the last 72 hours. Anemia Panel: No results for input(s): VITAMINB12, FOLATE, FERRITIN, TIBC, IRON, RETICCTPCT in the last 72 hours. Urine analysis:    Component Value Date/Time   COLORURINE YELLOW 08/14/2015 0057   APPEARANCEUR CLEAR 08/14/2015 0057   LABSPEC 1.010 08/14/2015 0057   PHURINE 7.5 08/14/2015 0057   GLUCOSEU NEGATIVE 08/14/2015 0057   HGBUR LARGE (A) 08/14/2015 0057   BILIRUBINUR NEGATIVE 08/14/2015 0057   KETONESUR NEGATIVE 08/14/2015 0057   PROTEINUR 30 (A) 08/14/2015 0057   UROBILINOGEN 0.2 08/14/2015 0057   NITRITE NEGATIVE 08/14/2015 0057   LEUKOCYTESUR NEGATIVE 08/14/2015 0057   Sepsis Labs: Invalid input(s): PROCALCITONIN, LACTICIDVEN  Recent Results (from the past 240 hour(s))  Respiratory Panel by PCR     Status: None   Collection Time: 01/28/18  7:23 PM  Result Value Ref Range Status   Adenovirus NOT DETECTED NOT DETECTED Final   Coronavirus 229E NOT DETECTED NOT DETECTED Final   Coronavirus HKU1 NOT DETECTED NOT  DETECTED Final   Coronavirus NL63 NOT DETECTED NOT DETECTED Final   Coronavirus OC43 NOT DETECTED NOT DETECTED Final   Metapneumovirus NOT DETECTED NOT DETECTED Final   Rhinovirus / Enterovirus NOT DETECTED NOT DETECTED Final   Influenza A NOT DETECTED NOT DETECTED Final   Influenza B NOT DETECTED NOT DETECTED Final   Parainfluenza Virus 1 NOT DETECTED NOT DETECTED Final   Parainfluenza Virus 2 NOT DETECTED NOT DETECTED Final   Parainfluenza Virus 3 NOT DETECTED NOT DETECTED Final   Parainfluenza Virus 4 NOT DETECTED NOT DETECTED Final   Respiratory Syncytial Virus NOT DETECTED NOT DETECTED Final   Bordetella pertussis NOT DETECTED NOT DETECTED Final   Chlamydophila pneumoniae NOT DETECTED NOT DETECTED Final   Mycoplasma pneumoniae NOT DETECTED NOT DETECTED Final    Comment: Performed at  [facility], [place] [address], [place], [place] [address]  MRSA PCR Screening     Status: None   Collection Time: 01/28/18 11:08 PM  Result Value Ref Range Status   MRSA by PCR NEGATIVE NEGATIVE Final    Comment:        The GeneXpert MRSA Assay (FDA approved for NASAL specimens only), is one component of a comprehensive MRSA colonization surveillance program. It is not intended to diagnose MRSA infection nor to guide or monitor treatment for MRSA infections. Performed at [facility], [place] [address], [place], [place] [address]       Radiology Studies: Dg Chest 2 View  Result Date: 01/31/2018 CLINICAL DATA:  Productive cough over the last 3 weeks. EXAM: CHEST  2 VIEW COMPARISON:  01/28/2018.  01/23/2018. FINDINGS: Chronic mild cardiomegaly. Chronic aortic atherosclerosis. Chronic pulmonary lung markings. Worsened markings in the left lower lobe consistent with worsened pneumonia. This is particularly evident overlying the spine on the lateral view. No effusions. No acute bone finding. IMPRESSION: Worsening left lower lobe pneumonia. Cardiomegaly and aortic atherosclerosis.  Electronically Signed   By: Nelson Chimes M.D.   On: 01/31/2018 11:43     Scheduled Meds: . atorvastatin  10 mg Oral Daily  . diltiazem  360 mg Oral Daily  . fluticasone  2 spray Each Nare Daily  . furosemide  20 mg Oral Daily  . guaiFENesin  600 mg Oral BID  . HYDROcodone-homatropine  5 mL Oral Q6H WA  . magnesium oxide  800 mg Oral BID  . metoprolol succinate  12.5 mg Oral Daily  . potassium chloride  40 mEq Oral Daily  . predniSONE  40 mg Oral Q supper  . spironolactone  25 mg Oral Daily  . warfarin  6 mg Oral ONCE-1800  . Warfarin - Pharmacist Dosing Inpatient   Does not apply q1800   Continuous Infusions: . ampicillin-sulbactam (UNASYN) IV Stopped (02/01/18 1309)     Marzetta Board, MD, PhD [facility] Pager [phone]  If 7PM-7AM, please contact night-coverage [email or web address] Password [email or web address] 02/01/2018, 3:12 PM

## 2018-02-01 NOTE — Evaluation (Signed)
Clinical/Bedside Swallow Evaluation Patient Details  Name: [name] MRN: [id number] Date of Birth: 15-Oct-1921  Today's Date: 02/01/2018 Time: SLP Start Time (ACUTE ONLY): 1120 SLP Stop Time (ACUTE ONLY): 1145 SLP Time Calculation (min) (ACUTE ONLY): 25 min  Past Medical History:  Past Medical History:  Diagnosis Date  . AAA (abdominal aortic aneurysm) ([place])   . Anxiety   . Bladder cancer ([place]) dx'd 2011   Chronic microscopic hematuria; transitional cell cancer  . Blood transfusion 1960   "related to hysterectomy"  . Chronic atrial fibrillation (HCC)    Anticoagulated with warfarin, rate control with diltiazem and Toprol  . Chronic back pain   . Chronic bronchitis    "get it basically 3 times/yr"  . Chronic headache    "probably 2/wk" (08/28/2015)  . Concussion w/o coma 07/21/9832   Complicated by subarachnoid hemorrhage.  "even now has times when she's not able to comprehend" (05/08/12)  . Coronary artery disease    Nonobstructive  . Diverticulitis   . Diverticulosis   . DVT (deep venous thrombosis), right 2005   "right calf after 8 foot fall"  . Edema of both legs    Chronic, thought to be secondary to DVTs  . External hemorrhoids   . Family history of adverse reaction to anesthesia 2012   daughter "had OR for crushed hand; had problems w/anesthesia & I was in there all day long" (  . Fatty liver   . Frequent UTI   . GERD (gastroesophageal reflux disease)   . H/O hiatal hernia   . High cholesterol   . Migraine    "rare now" (08/28/2015)  . Pneumonia ~ 2010;  2013; 07/2015  . Rheumatoid arthritis(714.0)    "hands" (08/28/2015)  . Urinary frequency    Past Surgical History:  Past Surgical History:  Procedure Laterality Date  . BREAST CYST EXCISION Left 1960's?   2 cysts; benign  . CARDIAC CATHETERIZATION  1980's  . CATARACT EXTRACTION W/ INTRAOCULAR LENS  IMPLANT, BILATERAL Bilateral 1992  . CYSTOSCOPY  11/11/2011   Procedure: CYSTOSCOPY;  Surgeon: Malka So;  Location: [facility];  Service: Urology;  Laterality: N/A;  . CYSTOSTOMY W/ BLADDER BIOPSY  10/08/2013  . INCONTINENCE SURGERY  1980's  . JOINT REPLACEMENT    . KNEE ARTHROSCOPY Right 2005   S/P fall  . Lower Extremity Venous Doppler  11/10/2013   No DVT or superficial thrombus enlarged inguinal lymph node noted in the right. No Baker's cyst.  . TONSILLECTOMY  1952  . TOTAL KNEE ARTHROPLASTY Right 02/05/2013   Procedure: TOTAL KNEE ARTHROPLASTY;  Surgeon: Mauri Pole, MD;  Location: [facility];  Service: Orthopedics;  Laterality: Right;  . TRANSURETHRAL RESECTION OF BLADDER TUMOR  11/11/2011   Procedure: TRANSURETHRAL RESECTION OF BLADDER TUMOR (TURBT);  Surgeon: Malka So;  Location: [facility];  Service: Urology;  Laterality: N/A;  Cysto, Bladder Biopsy, TURBT with [facility],   . TRANSURETHRAL RESECTION OF BLADDER TUMOR WITH GYRUS (TURBT-GYRUS)  2007; 2009; 2010   "for tumors on surface of bladder"  . TUMOR EXCISION  1976; 1980's   "fatty tumor cut off her upper back; left thumb"  . VAGINAL HYSTERECTOMY  1960   partial    HPI:  Pt is a 84 year old female with mild dementia, arriving with acute bronchitic without pna though repeat CXR on 2/19 shows pna in the left costochondral angle   Assessment / Plan / Recommendation Clinical Impression  Pt demonstrates no indication of oropharyngeal dysphagia.  She does endorse frequent GER and waking up with excessive mucous in her throat. She sleeps flat on her left side. We discussed potential of night time reflux with potential for aspiration events and possible compensatory strategies. A wedge to elevevate her head and chest could be beneficial. No further SLP interventions or diet modification needed. Will sign off.  SLP Visit Diagnosis: Dysphagia, unspecified (R13.10)    Aspiration Risk  Mild aspiration risk    Diet Recommendation Regular;Thin liquid   Liquid Administration via: Cup;Straw Medication Administration: Whole meds with  liquid Supervision: Patient able to self feed Postural Changes: Seated upright at 90 degrees    Other  Recommendations     Follow up Recommendations None      Frequency and Duration            Prognosis        Swallow Study   General HPI: Pt is a 84 year old female with mild dementia, arriving with acute bronchitic without pna though repeat CXR on 2/19 shows pna in the left costochondral angle Type of Study: Bedside Swallow Evaluation Previous Swallow Assessment: none Diet Prior to this Study: Regular;Thin liquids Temperature Spikes Noted: No Respiratory Status: Room air History of Recent Intubation: No Behavior/Cognition: Alert;Cooperative;Pleasant mood Oral Cavity Assessment: Within Functional Limits Oral Care Completed by SLP: No Oral Cavity - Dentition: Dentures, top;Dentures, bottom Vision: Functional for self-feeding Self-Feeding Abilities: Able to feed self Patient Positioning: Upright in bed Baseline Vocal Quality: Normal Volitional Cough: Strong Volitional Swallow: Able to elicit    Oral/Motor/Sensory Function Overall Oral Motor/Sensory Function: Within functional limits   Ice Chips     Thin Liquid Thin Liquid: Within functional limits Presentation: Cup;Straw;Self Fed    Nectar Thick Nectar Thick Liquid: Not tested   Honey Thick Honey Thick Liquid: Not tested   Puree Puree: Within functional limits   Solid   GO   Solid: Within functional limits        , Katherene Ponto 02/01/2018,2:22 PM

## 2018-02-01 NOTE — Progress Notes (Signed)
OT Cancellation Note  Patient Details Name: VESTER TITSWORTH MRN: [id number] DOB: Jan 03, 1921   Cancelled Treatment:    Reason Eval/Treat Not Completed: Other (comment): Attempted to see pt this afternoon. Pt being attended to by nursing staff. Will check back later today to progress with plan of care.   Norman Herrlich, MS OTR/L  Pager: [phone]   Norman Herrlich 02/01/2018, 12:22 PM

## 2018-02-01 NOTE — Progress Notes (Signed)
Occupational Therapy Treatment Patient Details Name: [name] MRN: [id number] DOB: 02-11-1921 Today's Date: 02/01/2018    History of present illness Pt is a 84 y/o female with pmh significant for bladder CA, afib, AAA, HTN, presenting with 2 week h/o SOB and wheezing.  PCP dx'd with PNA, but has not improved with treatment.  Workup showed bronchitis, pt's HR now under control.   OT comments  Pt is demonstrating progress toward OT goals. Her daughter who she lives with was present and engaged in session today. Pt educated concerning safety with tub transfers and need for assistance from daughter as she requires min assist to maintain balance. Pt has temporary grab bars in shower but pt's daughter reports that these fall off the wall regularly and they plan to install permanent ones. Pt additionally educated concerning energy conservation strategies with handout provided and she verbalizes understanding but did demonstrate slight distraction during session. She would benefit from continued OT services while admitted.    Follow Up Recommendations  No OT follow up;Supervision - Intermittent    Equipment Recommendations  None recommended by OT    Recommendations for Other Services      Precautions / Restrictions Precautions Precautions: Fall Restrictions Weight Bearing Restrictions: No       Mobility Bed Mobility Overal bed mobility: Independent                Transfers Overall transfer level: Needs assistance   Transfers: Sit to/from Stand Sit to Stand: Supervision         General transfer comment: for safety    Balance Overall balance assessment: Mild deficits observed, not formally tested                                         ADL either performed or assessed with clinical judgement   ADL Overall ADL's : Needs assistance/impaired                         Toilet Transfer: Supervision/safety;Ambulation       Tub/ Shower  Transfer: Minimal assistance;Ambulation;Tub transfer Tub/Shower Transfer Details (indicate cue type and reason): Pt very adamant that she will use her suction cup grab bars at home but daughter reports these fall off the wall. Encouraged pt that she should always have assistance from her daughter.  Functional mobility during ADLs: Supervision/safety General ADL Comments: Educated pt concerning energy conservation strategies with handout provided. Pt's daughter present during session and very vocal concerning her mothers safety. Pt clearly agitated with her daughter. Noted decreased stability while ambulating when focusing on her environment rather than the task at hand.      Vision   Vision Assessment?: No apparent visual deficits   Perception     Praxis      Cognition Arousal/Alertness: Awake/alert Behavior During Therapy: WFL for tasks assessed/performed Overall Cognitive Status: Within Functional Limits for tasks assessed                                 General Comments: Pt moves quickly and requires cues for safety at times.         Exercises     Shoulder Instructions       General Comments SpO2 desaturation to 86% during mobility. Improved with pursed lip breathing techniques.  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  Plan Discharge plan remains appropriate    Co-evaluation                 AM-PAC PT "6 Clicks" Daily Activity     Outcome Measure   Help from another person eating meals?: None Help from another person taking care of personal grooming?: A Little Help from another person toileting, which includes using toliet, bedpan, or urinal?: A Little Help from another person bathing (including washing, rinsing, drying)?: A Little Help from another person to put on and taking off regular upper body clothing?: None Help from another person to put on and taking off regular lower body clothing?: A Little 6 Click Score: 20    End of Session Equipment Utilized During Treatment: Gait belt  OT Visit Diagnosis: Other abnormalities of gait and mobility (R26.89)   Activity Tolerance Patient tolerated treatment well   Patient Left in bed;with call bell/phone within reach;with family/visitor present   Nurse Communication Mobility status        Time: 1439-1510 OT Time Calculation (min): 31 min  Charges: OT General Charges $OT Visit: 1 Visit OT Treatments $Self Care/Home Management : 8-22 mins $Therapeutic Activity: 8-22 mins  Norman Herrlich, MS OTR/L  Pager: [place] [facility]  02/01/2018, 4:12 PM

## 2018-02-01 NOTE — Progress Notes (Signed)
[facility] for Warfarin  Indication: atrial fibrillation  Allergies  Allergen Reactions  . Contrast Media [Iodinated Diagnostic Agents] Hives  . Levaquin [Levofloxacin] Itching and Rash  . Penicillins Rash    "haven't had it in years"  . Vancomycin Itching and Rash   Patient Measurements: Height: 5\' 5"  (165.1 cm) Weight: 169 lb 4.8 oz (76.8 kg) IBW/kg (Calculated) : 57  Vital Signs: Temp: 97.6 F (36.4 C) (02/20 0357) Temp Source: Oral (02/20 0357) BP: 107/61 (02/20 0836) Pulse Rate: 75 (02/20 0836)  Labs: Recent Labs    01/29/18 1026 01/30/18 0511 01/31/18 0343 02/01/18 0357  HGB  --   --  13.6 13.8  HCT  --   --  39.8 41.5  PLT  --   --  267 304  LABPROT  --  25.1* 22.8* 21.2*  INR  --  2.29 2.04 1.85  CREATININE  --  0.72 0.78 0.85  TROPONINI <0.03  --   --   --     Estimated Creatinine Clearance: 41.5 mL/min (by C-G formula based on SCr of 0.85 mg/dL).   Assessment: 84 y/o F here with COPD exacerbation, on warfarin PTA for afib Warfarin PTA dose: 2.5 mg on Tues/Sat, 5 mg all other days  CBC stable INR = 1.85  Missed dose 2/16  Goal of Therapy:  INR 2-3 Monitor platelets by anticoagulation protocol: Yes   Plan:  Warfarin 6 mg po x 1 dose tonight Daily INR Monitor for bleeding  Thank you Anette Guarneri, PharmD [phone] 02/01/2018 9:35 AM

## 2018-02-02 DIAGNOSIS — J189 Pneumonia, unspecified organism: Secondary | ICD-10-CM

## 2018-02-02 DIAGNOSIS — E871 Hypo-osmolality and hyponatremia: Secondary | ICD-10-CM

## 2018-02-02 LAB — COMPREHENSIVE METABOLIC PANEL
ALBUMIN: 3.1 g/dL — AB (ref 3.5–5.0)
ALT: 36 U/L (ref 14–54)
AST: 29 U/L (ref 15–41)
Alkaline Phosphatase: 56 U/L (ref 38–126)
Anion gap: 12 (ref 5–15)
BILIRUBIN TOTAL: 0.7 mg/dL (ref 0.3–1.2)
BUN: 17 mg/dL (ref 6–20)
CO2: 23 mmol/L (ref 22–32)
CREATININE: 0.8 mg/dL (ref 0.44–1.00)
Calcium: 8.7 mg/dL — ABNORMAL LOW (ref 8.9–10.3)
Chloride: 97 mmol/L — ABNORMAL LOW (ref 101–111)
GFR calc non Af Amer: >60 mL/min (ref >60)
GLUCOSE: 205 mg/dL — AB (ref 65–99)
Potassium: 5 mmol/L (ref 3.5–5.1)
SODIUM: 132 mmol/L — AB (ref 135–145)
Total Protein: 6 g/dL — ABNORMAL LOW (ref 6.5–8.1)

## 2018-02-02 LAB — PROTIME-INR
INR: 1.8
Prothrombin Time: 20.8 seconds — ABNORMAL HIGH (ref 11.4–15.2)

## 2018-02-02 LAB — CBC
HCT: 39.4 % (ref 36.0–46.0)
Hemoglobin: 12.7 g/dL (ref 12.0–15.0)
MCH: 31.7 pg (ref 26.0–34.0)
MCHC: 32.2 g/dL (ref 30.0–36.0)
MCV: 98.3 fL (ref 78.0–100.0)
PLATELETS: 267 10*3/uL (ref 150–400)
RBC: 4.01 MIL/uL (ref 3.87–5.11)
RDW: 13.3 % (ref 11.5–15.5)
WBC: 10.3 10*3/uL (ref 4.0–10.5)

## 2018-02-02 MED ORDER — WARFARIN SODIUM 5 MG PO TABS
6.0000 mg | ORAL_TABLET | Freq: Once | ORAL | Status: AC
  Administered 2018-02-02: 6 mg via ORAL
  Filled 2018-02-02: qty 1

## 2018-02-02 MED ORDER — HYDROCORTISONE 1 % EX CREA
TOPICAL_CREAM | CUTANEOUS | Status: DC | PRN
  Filled 2018-02-02: qty 28

## 2018-02-02 NOTE — Progress Notes (Signed)
[facility] for Warfarin  Indication: atrial fibrillation  Allergies  Allergen Reactions  . Contrast Media [Iodinated Diagnostic Agents] Hives  . Levaquin [Levofloxacin] Itching and Rash  . Penicillins Rash    "haven't had it in years"  . Vancomycin Itching and Rash   Patient Measurements: Height: 5\' 5"  (165.1 cm) Weight: 167 lb (75.8 kg) IBW/kg (Calculated) : 57  Vital Signs: Temp: 98.3 F (36.8 C) (02/21 0417) Temp Source: Oral (02/21 0417) BP: 124/54 (02/21 0906) Pulse Rate: 111 (02/21 0906)  Labs: Recent Labs    01/31/18 0343 02/01/18 0357 02/02/18 0430  HGB 13.6 13.8 12.7  HCT 39.8 41.5 39.4  PLT 267 304 267  LABPROT 22.8* 21.2* 20.8*  INR 2.04 1.85 1.80  CREATININE 0.78 0.85 0.80    Estimated Creatinine Clearance: 43.8 mL/min (by C-G formula based on SCr of 0.8 mg/dL).   Assessment: 84 y/o F here with COPD exacerbation, on warfarin PTA for afib Warfarin PTA dose: 2.5 mg on Tues/Sat, 5 mg all other days  CBC stable INR = 1.8  Missed dose 2/16  Goal of Therapy:  INR 2-3 Monitor platelets by anticoagulation protocol: Yes   Plan:  Repeat Warfarin 6 mg po x 1 dose tonight Daily INR Monitor for bleeding  Thank you Anette Guarneri, PharmD [phone] 02/02/2018 9:50 AM

## 2018-02-02 NOTE — Progress Notes (Signed)
PROGRESS NOTE  [name] GYF:[id number] DOB: [birth year] DOA: 01/28/2018 PCP: [name], MD   LOS: 5 days   Brief Narrative / Interim history: 84 year old female with history of bladder cancer, A. fib on Coumadin, AAA, hypertension, GERD, who comes to the hospital with couple weeks of shortness of breath as well as wheezing.  She presented to the PCP prior to this admission, was diagnosed with pneumonia and started on Levaquin.  But she failed to improve on Levaquin and presented to the hospital and was admitted on 2/16.  Assessment & Plan: Active Problems:   Hypertension   GERD (gastroesophageal reflux disease)   Bladder cancer (HCC)   Chronic atrial fibrillation (HCC)   Hyponatremia   Left ventricular diastolic dysfunction, NYHA class 2   Reactive airway disease with acute exacerbation   Atrial fibrillation with RVR (HCC)   Left lower lobe pneumonia -Patient admitted with pneumonia/bronchitis as well as reactive airway disease with significant wheezing -Continue prednisone, nebulizers, cough suppressants -Continue antibiotics, transition to Augmentin on discharge  -Feels better, however still not at baseline with significant weakness when she moves around.  Hopefully home tomorrow  Chronic A. fib -Continue Coumadin, rate control with metoprolol and Cardizem  Chronic diastolic heart failure -Most recent 2D echo with a EF of 60-65% -Continue Lasix  Bladder cancer  -status post TURBT -outpatient follow-up  Hyponatremia -Initially thought to be hypovolemic, responded to fluids over felt that may be a little fluid overloaded -Continue Lasix, continue to monitor, improving today  AAA  -last evaluated 08/2015 needs outpatient ultrasound repeat    DVT prophylaxis: Coumadin  Code Status: Full code Family Communication: no family at bedside Disposition Plan: home 1 day  Consultants:   None   Procedures:   None   Antimicrobials:  Doxycycline  2/17-2/18  Unasyn 2/19  Subjective: -Still dyspneic with ambulation however appreciates improvement.  Able to work some with PT yesterday.  Objective: Vitals:   02/01/18 2218 02/01/18 2350 02/02/18 0417 02/02/18 0906  BP: 130/70  (!) 120/58 (!) 124/54  Pulse: 89 83 92 (!) 111  Resp: 16 17 17 12   Temp: 97.6 F (36.4 C)  98.3 F (36.8 C)   TempSrc: Oral  Oral   SpO2: 97% 93% 92% 98%  Weight:   75.8 kg (167 lb)   Height:        Intake/Output Summary (Last 24 hours) at 02/02/2018 1256 Last data filed at 02/02/2018 1130 Gross per 24 hour  Intake 1600 ml  Output 2150 ml  Net -550 ml   Filed Weights   01/31/18 0500 02/01/18 0357 02/02/18 0417  Weight: 74.7 kg (164 lb 11.2 oz) 76.8 kg (169 lb 4.8 oz) 75.8 kg (167 lb)    Examination:  Constitutional: NAD Respiratory: CTA biL, no wheezing, no crackles  Cardiovascular: irregular, no murmurs, no lower extremity edema Abdomen: Soft, nontender, nondistended  Data Reviewed: I have independently reviewed following labs and imaging studies   CBC: Recent Labs  Lab 01/28/18 1251 01/29/18 0421 01/31/18 0343 02/01/18 0357 02/02/18 0430  WBC 8.8 8.4 11.2* 12.5* 10.3  NEUTROABS 5.4  --  9.3* 10.2*  --   HGB 14.2 12.9 13.6 13.8 12.7  HCT 40.9 37.2 39.8 41.5 39.4  MCV 95.3 94.4 96.8 100.0 98.3  PLT 268 242 267 304 916   Basic Metabolic Panel: Recent Labs  Lab 01/29/18 0421 01/30/18 0511 01/31/18 0343 02/01/18 0357 02/02/18 0430  NA 132* 134* 133* 129* 132*  K 3.1* 3.5 4.4 4.9 5.0  CL 97* 98* 97* 94* 97*  CO2 23 25 23 25 23   GLUCOSE 195* 133* 166* 160* 205*  BUN 9 12 17 17 17   CREATININE 0.70 0.72 0.78 0.85 0.80  CALCIUM 9.0 8.9 8.8* 8.7* 8.7*  MG 1.7 1.6* 1.9  --   --   PHOS 2.7  --   --  3.3  --    GFR: Estimated Creatinine Clearance: 43.8 mL/min (by C-G formula based on SCr of 0.8 mg/dL). Liver Function Tests: Recent Labs  Lab 01/28/18 1251 01/29/18 0421 01/30/18 0511 01/31/18 0343 02/01/18 0357  02/02/18 0430  AST 26 24 34 37  --  29  ALT 22 20 26  34  --  36  ALKPHOS 80 67 61 62  --  56  BILITOT 0.7 0.8 0.6 0.6  --  0.7  PROT 7.3 6.4* 5.8* 6.3*  --  6.0*  ALBUMIN 3.8 3.4* 3.1* 3.3* 3.3* 3.1*   No results for input(s): LIPASE, AMYLASE in the last 168 hours. No results for input(s): AMMONIA in the last 168 hours. Coagulation Profile: Recent Labs  Lab 01/29/18 0421 01/30/18 0511 01/31/18 0343 02/01/18 0357 02/02/18 0430  INR 2.23 2.29 2.04 1.85 1.80   Cardiac Enzymes: Recent Labs  Lab 01/28/18 1411 01/28/18 2342 01/29/18 0421 01/29/18 1026  TROPONINI <0.03 <0.03 <0.03 <0.03   BNP (last 3 results) No results for input(s): PROBNP in the last 8760 hours. HbA1C: No results for input(s): HGBA1C in the last 72 hours. CBG: No results for input(s): GLUCAP in the last 168 hours. Lipid Profile: No results for input(s): CHOL, HDL, LDLCALC, TRIG, CHOLHDL, LDLDIRECT in the last 72 hours. Thyroid Function Tests: No results for input(s): TSH, T4TOTAL, FREET4, T3FREE, THYROIDAB in the last 72 hours. Anemia Panel: No results for input(s): VITAMINB12, FOLATE, FERRITIN, TIBC, IRON, RETICCTPCT in the last 72 hours. Urine analysis:    Component Value Date/Time   COLORURINE YELLOW 08/14/2015 0057   APPEARANCEUR CLEAR 08/14/2015 0057   LABSPEC 1.010 08/14/2015 0057   PHURINE 7.5 08/14/2015 0057   GLUCOSEU NEGATIVE 08/14/2015 0057   HGBUR LARGE (A) 08/14/2015 0057   BILIRUBINUR NEGATIVE 08/14/2015 0057   KETONESUR NEGATIVE 08/14/2015 0057   PROTEINUR 30 (A) 08/14/2015 0057   UROBILINOGEN 0.2 08/14/2015 0057   NITRITE NEGATIVE 08/14/2015 0057   LEUKOCYTESUR NEGATIVE 08/14/2015 0057   Sepsis Labs: Invalid input(s): PROCALCITONIN, LACTICIDVEN  Recent Results (from the past 240 hour(s))  Respiratory Panel by PCR     Status: None   Collection Time: 01/28/18  7:23 PM  Result Value Ref Range Status   Adenovirus NOT DETECTED NOT DETECTED Final   Coronavirus 229E NOT DETECTED  NOT DETECTED Final   Coronavirus HKU1 NOT DETECTED NOT DETECTED Final   Coronavirus NL63 NOT DETECTED NOT DETECTED Final   Coronavirus OC43 NOT DETECTED NOT DETECTED Final   Metapneumovirus NOT DETECTED NOT DETECTED Final   Rhinovirus / Enterovirus NOT DETECTED NOT DETECTED Final   Influenza A NOT DETECTED NOT DETECTED Final   Influenza B NOT DETECTED NOT DETECTED Final   Parainfluenza Virus 1 NOT DETECTED NOT DETECTED Final   Parainfluenza Virus 2 NOT DETECTED NOT DETECTED Final   Parainfluenza Virus 3 NOT DETECTED NOT DETECTED Final   Parainfluenza Virus 4 NOT DETECTED NOT DETECTED Final   Respiratory Syncytial Virus NOT DETECTED NOT DETECTED Final   Bordetella pertussis NOT DETECTED NOT DETECTED Final   Chlamydophila pneumoniae NOT DETECTED NOT DETECTED Final   Mycoplasma pneumoniae NOT DETECTED NOT DETECTED Final  Comment: Performed at [facility], [place] [address] [place], [place] [address]  MRSA PCR Screening     Status: None   Collection Time: 01/28/18 11:08 PM  Result Value Ref Range Status   MRSA by PCR NEGATIVE NEGATIVE Final    Comment:        The GeneXpert MRSA Assay (FDA approved for NASAL specimens only), is one component of a comprehensive MRSA colonization surveillance program. It is not intended to diagnose MRSA infection nor to guide or monitor treatment for MRSA infections. Performed at [facility], [place] [address], [place], [place] [address]       Radiology Studies: No results found.   Scheduled Meds: . atorvastatin  10 mg Oral Daily  . diltiazem  360 mg Oral Daily  . fluticasone  2 spray Each Nare Daily  . furosemide  20 mg Oral Daily  . guaiFENesin  600 mg Oral BID  . HYDROcodone-homatropine  5 mL Oral Q6H WA  . magnesium oxide  800 mg Oral BID  . metoprolol succinate  12.5 mg Oral Daily  . predniSONE  40 mg Oral Q supper  . spironolactone  25 mg Oral Daily  . warfarin  6 mg Oral ONCE-1800  . Warfarin - Pharmacist Dosing  Inpatient   Does not apply q1800   Continuous Infusions: . ampicillin-sulbactam (UNASYN) IV Stopped (02/02/18 0539)     Marzetta Board, MD, PhD [facility] Pager [phone]  If 7PM-7AM, please contact night-coverage [email or web address] Password [facility] 02/02/2018, 12:56 PM

## 2018-02-03 LAB — PROTIME-INR
INR: 1.83
Prothrombin Time: 21 seconds — ABNORMAL HIGH (ref 11.4–15.2)

## 2018-02-03 MED ORDER — CEFPODOXIME PROXETIL 200 MG PO TABS: 200.0000 mg | ORAL_TABLET | Freq: Two times a day (BID) | ORAL | 0 refills | Status: DC

## 2018-02-03 NOTE — Discharge Summary (Signed)
Physician Discharge Summary  [name] NID:[id number] DOB: 03/09/1921 DOA: 01/28/2018  PCP: [name], MD  Admit date: 01/28/2018 Discharge date: 02/03/2018  Admitted From: home Disposition:  home  Recommendations for Outpatient Follow-up:  1. Follow up with PCP in 1-2 weeks 2. Continue Cefpodoxime for 4 more days   Home Health: none Equipment/Devices: none  Discharge Condition: stable CODE STATUS: Full code Diet recommendation: regular  HPI: Per Dr. Roel Cluck, [name] is a 84 y.o. female with medical history significant of bladder cancer, a.fib on coumadin, AAA, HTN, GERD Presented with 2 week of shortness of breath SOB and wheezing 6 days  Ago she presented to PCP was diagnosed with pneumonia she was started on Levaquin.  But states despite taking it for past 6 days she has not improved.  She continues to have wheezing dry cough ribs have been sore from coughing. Reports increased wheezing, fatigue She is endorsing some chest in her ribs worse with coughing she may have had some low fevers initially but not anymore. No sick contacts. She reports going to "elderly home" during the day to set up chairs and make coffee She is unsure of what medications she takes Dates in the past she has been diagnosed with bronchitis, She used to smoke but not recently While in ER: Given Nebulizer treatment with some improvement. Given solumedrol.   Hospital Course: Left lower lobe pneumonia -Patient admitted with pneumonia/bronchitis as well as reactive airway disease with significant wheezing.  She was placed on antibiotics, responded well, on the day of discharge she is on room air, stable, able to ambulate, worked with PT without further difficulties.  She responded well to Unasyn while in the hospital, however did have a mild rash and will convert to cefpodoxime to finish a course at home.  She was also placed initially on steroids and had a quick taper while hospitalized.  No  further need for steroids on discharge. Chronic A. Fib -Continue Coumadin. In the setting of illness due to #1, she initially had A. fib with RVR requiring increase of Cardizem as well as addition of metoprolol.  Once the pneumonia improved, her heart rate dipped into the 30s-50s, would favor to resume home regimen rather than to continue this increased doses at home Chronic diastolic heart failure -Most recent 2D echo with a EF of 60-65%. She is euvolemic Bladder cancer  -status post TURBT, outpatient follow-up Hyponatremia -stable, possibly poor solute intake AAA  -last evaluated 08/2015 needs outpatient ultrasound repeat    Discharge Diagnoses:  Active Problems:   Hypertension   GERD (gastroesophageal reflux disease)   Bladder cancer (HCC)   Chronic atrial fibrillation (HCC)   Hyponatremia   Left ventricular diastolic dysfunction, NYHA class 2   Reactive airway disease with acute exacerbation   Atrial fibrillation with RVR ([place])     Discharge Instructions   Allergies as of 02/03/2018      Reactions   Contrast Media [iodinated Diagnostic Agents] Hives   Amoxicillin Itching   Ampicillin Itching   Levaquin [levofloxacin] Itching, Rash   Penicillins Rash   "haven't had it in years"   Vancomycin Itching, Rash      Medication List    STOP taking these medications   cyclobenzaprine 10 MG tablet Commonly known as:  FLEXERIL   levofloxacin 500 MG tablet Commonly known as:  LEVAQUIN     TAKE these medications   atorvastatin 10 MG tablet Commonly known as:  LIPITOR Take 10 mg by mouth  daily.   bifidobacterium infantis capsule Take 1 capsule by mouth daily.   calcium carbonate 1250 (500 Ca) MG tablet Commonly known as:  OS-CAL - dosed in mg of elemental calcium Take 1 tablet by mouth every morning.   cefpodoxime 200 MG tablet Commonly known as:  VANTIN Take 1 tablet (200 mg total) by mouth 2 (two) times daily.   cholecalciferol 1000 units tablet Commonly known as:   VITAMIN D Take 1,000 Units by mouth daily.   Cinnamon 500 MG capsule Take 1,000 mg by mouth daily.   diltiazem 240 MG 24 hr capsule Commonly known as:  CARDIZEM CD Take 1 capsule (240 mg total) by mouth daily. What changed:  Another medication with the same name was removed. Continue taking this medication, and follow the directions you see here.   Fish Oil 1000 MG Caps Take 1,000 mg by mouth daily.   furosemide 20 MG tablet Commonly known as:  LASIX Take 1 tablet (20 mg total) by mouth daily.   multivitamins ther. w/minerals Tabs tablet Take 1 tablet by mouth daily.   polyethylene glycol packet Commonly known as:  MIRALAX / GLYCOLAX Take 17 g by mouth daily as needed for moderate constipation.   PRILOSEC 20 MG capsule Generic drug:  omeprazole Take 20 mg by mouth 2 (two) times daily.   psyllium 58.6 % powder Commonly known as:  METAMUCIL Take 1 packet by mouth 2 (two) times daily.   rosuvastatin 5 MG tablet Commonly known as:  CRESTOR Take 5 mg by mouth every evening.   spironolactone 25 MG tablet Commonly known as:  ALDACTONE Take 25 mg by mouth every morning.   warfarin 5 MG tablet Commonly known as:  COUMADIN Take 2.5-5 mg by mouth daily. Takes 1 tablet (5 mg) daily EXCEPT for 0.5 tablet (2.5 mg) on Tuesdays and Saturdays      Follow-up Information    [name], MD. Schedule an appointment as soon as possible for a visit in 3 week(s).   Specialty:  Internal Medicine Contact information: [address] [place] [address] [phone]           Consultations:  None   Procedures/Studies:  Dg Chest 2 View  Result Date: 01/31/2018 CLINICAL DATA:  Productive cough over the last 3 weeks. EXAM: CHEST  2 VIEW COMPARISON:  01/28/2018.  01/23/2018. FINDINGS: Chronic mild cardiomegaly. Chronic aortic atherosclerosis. Chronic pulmonary lung markings. Worsened markings in the left lower lobe consistent with worsened pneumonia. This is  particularly evident overlying the spine on the lateral view. No effusions. No acute bone finding. IMPRESSION: Worsening left lower lobe pneumonia. Cardiomegaly and aortic atherosclerosis. Electronically Signed   By: Nelson Chimes M.D.   On: 01/31/2018 11:43   Dg Chest 2 View  Result Date: 01/28/2018 CLINICAL DATA:  Cough and shortness of breath. EXAM: CHEST  2 VIEW COMPARISON:  Chest x-ray dated January 23, 2018. FINDINGS: Stable mild cardiomegaly. Normal pulmonary vascularity. Atherosclerotic calcification of the aortic arch. Stable scarring/atelectasis in the bilateral lower lobes. No focal consolidation, pleural effusion, or pneumothorax. No acute osseous abnormality. IMPRESSION: No active cardiopulmonary disease. Electronically Signed   By: Titus Dubin M.D.   On: 01/28/2018 13:28      Subjective: - no chest pain, shortness of breath, no abdominal pain, nausea or vomiting.   Discharge Exam: Vitals:   02/03/18 0525 02/03/18 0943  BP: (!) 146/68 (!) 119/48  Pulse: 77   Resp: 17   Temp: 98.5 F (36.9 C)  SpO2: 96%     General: Pt is alert, awake, not in acute distress Cardiovascular: irregular, no murmurs Respiratory: CTA bilaterally, no wheezing, no rhonchi Abdominal: Soft, NT, ND, bowel sounds + Extremities: no edema, no cyanosis    The results of significant diagnostics from this hospitalization (including imaging, microbiology, ancillary and laboratory) are listed below for reference.     Microbiology: Recent Results (from the past 240 hour(s))  Respiratory Panel by PCR     Status: None   Collection Time: 01/28/18  7:23 PM  Result Value Ref Range Status   Adenovirus NOT DETECTED NOT DETECTED Final   Coronavirus 229E NOT DETECTED NOT DETECTED Final   Coronavirus HKU1 NOT DETECTED NOT DETECTED Final   Coronavirus NL63 NOT DETECTED NOT DETECTED Final   Coronavirus OC43 NOT DETECTED NOT DETECTED Final   Metapneumovirus NOT DETECTED NOT DETECTED Final   Rhinovirus /  Enterovirus NOT DETECTED NOT DETECTED Final   Influenza A NOT DETECTED NOT DETECTED Final   Influenza B NOT DETECTED NOT DETECTED Final   Parainfluenza Virus 1 NOT DETECTED NOT DETECTED Final   Parainfluenza Virus 2 NOT DETECTED NOT DETECTED Final   Parainfluenza Virus 3 NOT DETECTED NOT DETECTED Final   Parainfluenza Virus 4 NOT DETECTED NOT DETECTED Final   Respiratory Syncytial Virus NOT DETECTED NOT DETECTED Final   Bordetella pertussis NOT DETECTED NOT DETECTED Final   Chlamydophila pneumoniae NOT DETECTED NOT DETECTED Final   Mycoplasma pneumoniae NOT DETECTED NOT DETECTED Final    Comment: Performed at [facility], [place] [address], [place], [place] [address]  MRSA PCR Screening     Status: None   Collection Time: 01/28/18 11:08 PM  Result Value Ref Range Status   MRSA by PCR NEGATIVE NEGATIVE Final    Comment:        The GeneXpert MRSA Assay (FDA approved for NASAL specimens only), is one component of a comprehensive MRSA colonization surveillance program. It is not intended to diagnose MRSA infection nor to guide or monitor treatment for MRSA infections. Performed at [facility], [place] [address] [place], [place] [address]      Labs: BNP (last 3 results) Recent Labs    01/29/18 1032  BNP 127.5*   Basic Metabolic Panel: Recent Labs  Lab 01/29/18 0421 01/30/18 0511 01/31/18 0343 02/01/18 0357 02/02/18 0430  NA 132* 134* 133* 129* 132*  K 3.1* 3.5 4.4 4.9 5.0  CL 97* 98* 97* 94* 97*  CO2 23 25 23 25 23   GLUCOSE 195* 133* 166* 160* 205*  BUN 9 12 17 17 17   CREATININE 0.70 0.72 0.78 0.85 0.80  CALCIUM 9.0 8.9 8.8* 8.7* 8.7*  MG 1.7 1.6* 1.9  --   --   PHOS 2.7  --   --  3.3  --    Liver Function Tests: Recent Labs  Lab 01/28/18 1251 01/29/18 0421 01/30/18 0511 01/31/18 0343 02/01/18 0357 02/02/18 0430  AST 26 24 34 37  --  29  ALT 22 20 26  34  --  36  ALKPHOS 80 67 61 62  --  56  BILITOT 0.7 0.8 0.6 0.6  --  0.7  PROT 7.3 6.4* 5.8*  6.3*  --  6.0*  ALBUMIN 3.8 3.4* 3.1* 3.3* 3.3* 3.1*   No results for input(s): LIPASE, AMYLASE in the last 168 hours. No results for input(s): AMMONIA in the last 168 hours. CBC: Recent Labs  Lab 01/28/18 1251 01/29/18 0421 01/31/18 0343 02/01/18 0357 02/02/18  0430  WBC 8.8 8.4 11.2* 12.5* 10.3  NEUTROABS 5.4  --  9.3* 10.2*  --   HGB 14.2 12.9 13.6 13.8 12.7  HCT 40.9 37.2 39.8 41.5 39.4  MCV 95.3 94.4 96.8 100.0 98.3  PLT 268 242 267 304 267   Cardiac Enzymes: Recent Labs  Lab 01/28/18 1411 01/28/18 2342 01/29/18 0421 01/29/18 1026  TROPONINI <0.03 <0.03 <0.03 <0.03   BNP: Invalid input(s): POCBNP CBG: No results for input(s): GLUCAP in the last 168 hours. D-Dimer No results for input(s): DDIMER in the last 72 hours. Hgb A1c No results for input(s): HGBA1C in the last 72 hours. Lipid Profile No results for input(s): CHOL, HDL, LDLCALC, TRIG, CHOLHDL, LDLDIRECT in the last 72 hours. Thyroid function studies No results for input(s): TSH, T4TOTAL, T3FREE, THYROIDAB in the last 72 hours.  Invalid input(s): FREET3 Anemia work up No results for input(s): VITAMINB12, FOLATE, FERRITIN, TIBC, IRON, RETICCTPCT in the last 72 hours. Urinalysis    Component Value Date/Time   COLORURINE YELLOW 08/14/2015 0057   APPEARANCEUR CLEAR 08/14/2015 0057   LABSPEC 1.010 08/14/2015 0057   PHURINE 7.5 08/14/2015 0057   GLUCOSEU NEGATIVE 08/14/2015 0057   HGBUR LARGE (A) 08/14/2015 0057   BILIRUBINUR NEGATIVE 08/14/2015 0057   KETONESUR NEGATIVE 08/14/2015 0057   PROTEINUR 30 (A) 08/14/2015 0057   UROBILINOGEN 0.2 08/14/2015 0057   NITRITE NEGATIVE 08/14/2015 0057   LEUKOCYTESUR NEGATIVE 08/14/2015 0057   Sepsis Labs Invalid input(s): PROCALCITONIN,  WBC,  LACTICIDVEN   Time coordinating discharge: 40 minutes  SIGNED:  Marzetta Board, MD  [facility] 02/03/2018, 1:54 PM Pager [phone]  If 7PM-7AM, please contact night-coverage [email or web address] Password  [email or web address]

## 2018-02-03 NOTE — Consult Note (Signed)
            [facility] CM Primary Care Navigator  02/03/2018  SOFIJA ANTWI 11-Feb-1921 [id number]   Went to seepatient at the bedside to identify possible discharge needsbutshe was already discharged per staff report. Patient was discharged home today.  Per MD note, patient was admitted and treated with pneumonia/ bronchitis as well as reactive airway disease with significant wheezing.  Primary care provider's office is listed as providing transition of care (TOC).   Patient has discharge instruction to follow-up with primary care provider in 1-2 weeks post discharge.   For additional questions please contact:  Edwena Felty A. , BSN, RN-BC [facility] PRIMARY CARE Navigator Cell: [phone]

## 2018-02-03 NOTE — Discharge Instructions (Signed)
Follow with [name], MD in 2-3 weeks  Please get a complete blood count and chemistry panel checked by your Primary MD at your next visit, and again as instructed by your Primary MD. Please get your medications reviewed and adjusted by your Primary MD.  Please request your Primary MD to go over all Hospital Tests and Procedure/Radiological results at the follow up, please get all Hospital records sent to your Prim MD by signing hospital release before you go home.  If you had Pneumonia of Lung problems at the Hospital: Please get a 2 view Chest X ray done in 6-8 weeks after hospital discharge or sooner if instructed by your Primary MD.  If you have Congestive Heart Failure: Please call your Cardiologist or Primary MD anytime you have any of the following symptoms:  1) 3 pound weight gain in 24 hours or 5 pounds in 1 week  2) shortness of breath, with or without a dry hacking cough  3) swelling in the hands, feet or stomach  4) if you have to sleep on extra pillows at night in order to breathe  Follow cardiac low salt diet and 1.5 lit/day fluid restriction.  If you have diabetes Accuchecks 4 times/day, Once in AM empty stomach and then before each meal. Log in all results and show them to your primary doctor at your next visit. If any glucose reading is under 80 or above 300 call your primary MD immediately.  If you have Seizure/Convulsions/Epilepsy: Please do not drive, operate heavy machinery, participate in activities at heights or participate in high speed sports until you have seen by Primary MD or a Neurologist and advised to do so again.  If you had Gastrointestinal Bleeding: Please ask your Primary MD to check a complete blood count within one week of discharge or at your next visit. Your endoscopic/colonoscopic biopsies that are pending at the time of discharge, will also need to followed by your Primary MD.  Get Medicines reviewed and adjusted. Please take all your  medications with you for your next visit with your Primary MD  Please request your Primary MD to go over all hospital tests and procedure/radiological results at the follow up, please ask your Primary MD to get all Hospital records sent to his/her office.  If you experience worsening of your admission symptoms, develop shortness of breath, life threatening emergency, suicidal or homicidal thoughts you must seek medical attention immediately by calling 911 or calling your MD immediately  if symptoms less severe.  You must read complete instructions/literature along with all the possible adverse reactions/side effects for all the Medicines you take and that have been prescribed to you. Take any new Medicines after you have completely understood and accpet all the possible adverse reactions/side effects.   Do not drive or operate heavy machinery when taking Pain medications.   Do not take more than prescribed Pain, Sleep and Anxiety Medications  Special Instructions: If you have smoked or chewed Tobacco  in the last 2 yrs please stop smoking, stop any regular Alcohol  and or any Recreational drug use.  Wear Seat belts while driving.  Please note You were cared for by a hospitalist during your hospital stay. If you have any questions about your discharge medications or the care you received while you were in the hospital after you are discharged, you can call the unit and asked to speak with the hospitalist on call if the hospitalist that took care of you is not available. Once  you are discharged, your primary care physician will handle any further medical issues. Please note that NO REFILLS for any discharge medications will be authorized once you are discharged, as it is imperative that you return to your primary care physician (or establish a relationship with a primary care physician if you do not have one) for your aftercare needs so that they can reassess your need for medications and monitor your  lab values.  You can reach the hospitalist office at phone [phone] or fax [phone]   If you do not have a primary care physician, you can call [phone] for a physician referral.  Activity: As tolerated with Full fall precautions use walker/cane & assistance as needed  Diet: regular  Disposition Home

## 2018-02-07 DIAGNOSIS — I4891 Unspecified atrial fibrillation: Secondary | ICD-10-CM | POA: Diagnosis not present

## 2018-02-07 DIAGNOSIS — J189 Pneumonia, unspecified organism: Secondary | ICD-10-CM | POA: Diagnosis not present

## 2018-02-08 ENCOUNTER — Ambulatory Visit: Payer: PPO | Admitting: Cardiology

## 2018-02-08 ENCOUNTER — Encounter: Payer: Self-pay | Admitting: Cardiology

## 2018-02-08 VITALS — BP 118/64 | HR 88 | Ht 65.0 in | Wt 164.8 lb

## 2018-02-08 DIAGNOSIS — I5189 Other ill-defined heart diseases: Secondary | ICD-10-CM

## 2018-02-08 DIAGNOSIS — I4891 Unspecified atrial fibrillation: Secondary | ICD-10-CM | POA: Diagnosis not present

## 2018-02-08 DIAGNOSIS — I714 Abdominal aortic aneurysm, without rupture, unspecified: Secondary | ICD-10-CM

## 2018-02-08 DIAGNOSIS — I482 Chronic atrial fibrillation, unspecified: Secondary | ICD-10-CM

## 2018-02-08 DIAGNOSIS — I700 Atherosclerosis of aorta: Secondary | ICD-10-CM

## 2018-02-08 DIAGNOSIS — I358 Other nonrheumatic aortic valve disorders: Secondary | ICD-10-CM

## 2018-02-08 DIAGNOSIS — R600 Localized edema: Secondary | ICD-10-CM | POA: Diagnosis not present

## 2018-02-08 DIAGNOSIS — I10 Essential (primary) hypertension: Secondary | ICD-10-CM

## 2018-02-08 DIAGNOSIS — I519 Heart disease, unspecified: Secondary | ICD-10-CM | POA: Diagnosis not present

## 2018-02-08 NOTE — Patient Instructions (Signed)
No change with current medications   Discuss with DR Maudie Mercury, ABOUT TAKING [place] .   CONTINUE WITH DILTIAZEM.    Your physician wants you to follow-up in [place].You will receive a reminder letter in the mail two months in advance. If you don't receive a letter, please call our office to schedule the follow-up appointment.   If you need a refill on your cardiac medications before your next appointment, please call your pharmacy.

## 2018-02-08 NOTE — Progress Notes (Signed)
Patient  PCP: Adriana Gravel, MD  Clinic Note: Chief Complaint  Patient presents with  . Hospitalization Follow-up    Pt here today concerned about high pulse rate   . Edema    Pt states her lower legs always swell     HPI: [name] is a 84 y.o. female with a PMH below who presents today for Hospital f/u - C Afib with ~ HFPF. Recent admission for PNA  Complicated by Afib RVR.   Long ago she had an ischemic evaluation that was negative for coronary disease. She's not had any anginal symptoms since. She may have some mild diastolic heart failure symptoms related to her chronic AFib on occasion. Apparently she had heart catheterization back in the 1980s which showed no significant disease.  [name] was last seen on Dec 31, 2013 (initial visit July 2014) -- noted Afib post-op Knee Sx in 07/2013.  (It would seem that the only time her A. fib becomes an issue is when she has another illness)  Recent Hospitalizations:   January 28, 2018 admitted for bronchitis with bronchospasm -> 2-week history of shortness of breath and wheezing referred by PCP after starting on Levaquin and having no improvement.  She noticed some rib pain chest discomfort.  Was helping set up at the assisted living game room and started feeling quite poor started having shortness of breath and coughing.  She was noted to have left lower lobe pneumonia.  She is felt to be euvolemic, but did have A. fib RVR.  Initially Cardizem dose was increased and beta-blocker was added, however her heart rate dropped into the 30s and 50s and the beta-blocker was discontinued in the way of her home diltiazem dose.  Studies Personally Reviewed - (if available, images/films reviewed: From Epic Chart or Care Everywhere)  Echo 12/2013: EF 60-65%. NO RWMA. Ao Sclerosis. MAC - no MS with mild MR>  B Atriae mildly dilated.  Interval History: Blen presents today actually feeling notably improved since her hospital stay.  She  states that since her hospital stay her heart rate has been much better.  She still feels a little bit weak and lethargic but the cough is clearing up now.  Once her heart rate got controlled, she did not have any more chest discomfort and actually her breathing improved.  Since discharge she has not had any chest tightness or pressure with rest or exertion.  She has not noted her heart rate getting up unless she is up walking around.  She has not had any PND, orthopnea and only has mild lower extremity edema. She had not had any syncope/near syncope or TIA/amaurosis fugax symptoms.  She remains on warfarin and has had no bleeding issues with no melena, hematochezia, hematuria or epistaxis.  Swelling has been controlled with low-dose Lasix No claudication while walking around.  ROS: A comprehensive was performed. Review of Systems  Constitutional: Positive for malaise/fatigue (Energy is slowly getting better after hospitalization). Negative for chills, fever and weight loss.  HENT: Negative for congestion and nosebleeds.   Respiratory: Positive for cough (Slowly improving). Negative for sputum production, shortness of breath (Getting better) and wheezing.   Cardiovascular: Negative for claudication.  Gastrointestinal: Negative for blood in stool, constipation, heartburn and melena.  Genitourinary: Negative for hematuria.  Musculoskeletal: Positive for back pain and joint pain (Mild arthralgias). Negative for falls.  Neurological: Negative for dizziness.  Psychiatric/Behavioral: Negative for memory loss. The patient is not nervous/anxious and does not have insomnia.  All other systems reviewed and are negative.  I have reviewed and (if needed) personally updated the patient's problem list, medications, allergies, past medical and surgical history, social and family history.   Past Medical History:  Diagnosis Date  . AAA (abdominal aortic aneurysm) ([place])   . Anxiety   . Bladder cancer ([place])  dx'd 2011   Chronic microscopic hematuria; transitional cell cancer  . Blood transfusion 1960   "related to hysterectomy"  . Chronic atrial fibrillation (HCC)    Anticoagulated with warfarin, rate control with diltiazem and Toprol  . Chronic back pain   . Chronic bronchitis    "get it basically 3 times/yr"  . Chronic headache    "probably 2/wk" (08/28/2015)  . Concussion w/o coma 07/07/2034   Complicated by subarachnoid hemorrhage.  "even now has times when she's not able to comprehend" (05/08/12)  . Coronary artery disease    Nonobstructive  . Diverticulitis   . Diverticulosis   . DVT (deep venous thrombosis), right 2005   "right calf after 8 foot fall"  . Edema of both legs    Chronic, thought to be secondary to DVTs  . External hemorrhoids   . Family history of adverse reaction to anesthesia 2012   daughter "had OR for crushed hand; had problems w/anesthesia & I was in there all day long" (  . Fatty liver   . Frequent UTI   . GERD (gastroesophageal reflux disease)   . H/O hiatal hernia   . High cholesterol   . Migraine    "rare now" (08/28/2015)  . Pneumonia ~ 2010;  2013; 07/2015  . Rheumatoid arthritis(714.0)    "hands" (08/28/2015)  . Urinary frequency     Past Surgical History:  Procedure Laterality Date  . BREAST CYST EXCISION Left 1960's?   2 cysts; benign  . CARDIAC CATHETERIZATION  1980's  . CATARACT EXTRACTION W/ INTRAOCULAR LENS  IMPLANT, BILATERAL Bilateral 1992  . CYSTOSCOPY  11/11/2011   Procedure: CYSTOSCOPY;  Surgeon: Malka So;  Location: [facility];  Service: Urology;  Laterality: N/A;  . CYSTOSTOMY W/ BLADDER BIOPSY  10/08/2013  . INCONTINENCE SURGERY  1980's  . JOINT REPLACEMENT    . KNEE ARTHROSCOPY Right 2005   S/P fall  . Lower Extremity Venous Doppler  11/10/2013   No DVT or superficial thrombus enlarged inguinal lymph node noted in the right. No Baker's cyst.  . TONSILLECTOMY  1952  . TOTAL KNEE ARTHROPLASTY Right 02/05/2013   Procedure: TOTAL  KNEE ARTHROPLASTY;  Surgeon: Mauri Pole, MD;  Location: [facility];  Service: Orthopedics;  Laterality: Right;  . TRANSTHORACIC ECHOCARDIOGRAM  12/2013   EF 60-65%. NO RWMA. Ao Sclerosis. MAC - no MS with mild MR>  B Atriae mildly dilated.  . TRANSURETHRAL RESECTION OF BLADDER TUMOR  11/11/2011   Procedure: TRANSURETHRAL RESECTION OF BLADDER TUMOR (TURBT);  Surgeon: Malka So;  Location: [facility];  Service: Urology;  Laterality: N/A;  Cysto, Bladder Biopsy, TURBT with Gyrus,   . TRANSURETHRAL RESECTION OF BLADDER TUMOR WITH GYRUS (TURBT-GYRUS)  2007; 2009; 2010   "for tumors on surface of bladder"  . TUMOR EXCISION  1976; 1980's   "fatty tumor cut off her upper back; left thumb"  . VAGINAL HYSTERECTOMY  1960   partial     Current Meds  Medication Sig  . atorvastatin (LIPITOR) 10 MG tablet Take 10 mg by mouth daily.  . bifidobacterium infantis (ALIGN) capsule Take 1 capsule by mouth daily.  . calcium carbonate (OS-CAL -  DOSED IN MG OF ELEMENTAL CALCIUM) 1250 MG tablet Take 1 tablet by mouth every morning.   . cholecalciferol (VITAMIN D) 1000 UNITS tablet Take 1,000 Units by mouth daily.  . Cinnamon 500 MG capsule Take 1,000 mg by mouth daily.  Marland Kitchen diltiazem (CARDIZEM CD) 240 MG 24 hr capsule Take 1 capsule (240 mg total) by mouth daily.  . furosemide (LASIX) 20 MG tablet Take 1 tablet (20 mg total) by mouth daily.  . Multiple Vitamins-Minerals (MULTIVITAMINS THER. W/MINERALS) TABS Take 1 tablet by mouth daily.   . Omega-3 Fatty Acids (FISH OIL) 1000 MG CAPS Take 1,000 mg by mouth daily.  Marland Kitchen omeprazole (PRILOSEC) 20 MG capsule Take 20 mg by mouth 2 (two) times daily.  . polyethylene glycol (MIRALAX / GLYCOLAX) packet Take 17 g by mouth daily as needed for moderate constipation.  . psyllium (METAMUCIL) 58.6 % powder Take 1 packet by mouth 2 (two) times daily.   . rosuvastatin (CRESTOR) 5 MG tablet Take 5 mg by mouth every evening.   Marland Kitchen spironolactone (ALDACTONE) 25 MG tablet Take 25 mg by mouth  every morning.  . warfarin (COUMADIN) 5 MG tablet Take 2.5-5 mg by mouth daily. Takes 1 tablet (5 mg) daily EXCEPT for 0.5 tablet (2.5 mg) on Tuesdays and Saturdays    Allergies  Allergen Reactions  . Contrast Media [Iodinated Diagnostic Agents] Hives  . Amoxicillin Itching  . Ampicillin Itching  . Levaquin [Levofloxacin] Itching and Rash  . Penicillins Rash    "haven't had it in years"  . Vancomycin Itching and Rash    Social History   Tobacco Use  . Smoking status: Former Smoker    Packs/day: 1.00    Years: 40.00    Pack years: 40.00    Types: Cigarettes    Last attempt to quit: 11/07/1982    Years since quitting: 35.2  . Smokeless tobacco: Former Systems developer    Types: Snuff  Substance Use Topics  . Alcohol use: Yes    Alcohol/week: 4.2 oz    Types: 7 Glasses of wine per week    Comment: 1 glass of wine prior to bed  . Drug use: No   Social History   Social History Narrative   She is a widowed mother of 65, grandmother of 35, great grandmother of 70. She does not   really get routine exercise. She quit smoking in 1983. She does not smoke and does not use illicit drugs and does not drink.    family history includes Anesthesia problems in her daughter; Breast cancer in her other; Heart disease in her brother; Prostate cancer in her brother.  Wt Readings from Last 3 Encounters:  02/08/18 164 lb 12.8 oz (74.8 kg)  02/03/18 164 lb 14.4 oz (74.8 kg)  12/16/17 168 lb (76.2 kg)    PHYSICAL EXAM BP 118/64 (BP Location: Right Arm, Patient Position: Sitting, Cuff Size: Normal)   Pulse 88   Ht _0  (1.651 m)   Wt 164 lb 12.8 oz (74.8 kg)   BMI 27.42 kg/m  Physical Exam  Constitutional: She is oriented to person, place, and time. She appears well-developed and well-nourished. No distress.  Very hard of hearing.  She is apparently seems occasionally confused.  However she does finally answer questions appropriately.  Well-groomed.  HENT:  Head: Normocephalic and atraumatic.    Eyes: EOM are normal.  Neck: Normal range of motion. Neck supple. No hepatojugular reflux and no JVD present. Carotid bruit is not present.  Cardiovascular: Normal  rate, S1 normal, S2 normal and normal pulses. An irregularly irregular rhythm present. PMI is not displaced. Exam reveals no gallop and no friction rub.  Murmur heard.  Medium-pitched harsh crescendo-decrescendo early systolic murmur is present with a grade of 1/6 at the upper right sternal border. Pulmonary/Chest: Effort normal. She exhibits tenderness (Bilateral rib tenderness).  Mildly reduced breath sounds in left base.  Does clear somewhat with cough.  Mild crackles/rhonchi.  No no rales.  No wheezing.  Abdominal: Soft. Bowel sounds are normal. She exhibits no distension. There is no tenderness. There is no rebound.  Musculoskeletal: Normal range of motion. She exhibits edema (1-2+.  Mild varicose veins but no significant dermatitis.).  Neurological: She is alert and oriented to person, place, and time. No cranial nerve deficit.  Skin: Skin is warm and dry.  Psychiatric: She has a normal mood and affect. Her behavior is normal. Judgment and thought content normal.  Nursing note and vitals reviewed.   Adult ECG Report  Rate: 88;  Rhythm: atrial fibrillation and LVH with repolarization changes.  Cannot exclude anterolateral ischemia with ST and T wave changes.;   Narrative Interpretation: Stable EKG.   Other studies Reviewed: Additional studies/ records that were reviewed today include:  Recent Labs:   Lab Results  Component Value Date   INR 1.83 02/03/2018   INR 1.80 02/02/2018   INR 1.85 02/01/2018   Lab Results  Component Value Date   CREATININE 0.80 02/02/2018   BUN 17 02/02/2018   NA 132 (L) 02/02/2018   K 5.0 02/02/2018   CL 97 (L) 02/02/2018   CO2 23 02/02/2018   Lab Results  Component Value Date   WBC 10.3 02/02/2018   HGB 12.7 02/02/2018   HCT 39.4 02/02/2018   MCV 98.3 02/02/2018   PLT 267  02/02/2018   No results found for: CHOL, HDL, LDLCALC, LDLDIRECT, TRIG, CHOLHDL   ASSESSMENT / PLAN: Problem List Items Addressed This Visit    AAA (abdominal aortic aneurysm) ([place])    Was noted back in December 2015.  Should be due for follow-up Doppler in September 2019. If not ordered by PCP prior to my next visit, we can we can check it then.      Relevant Orders   EKG 12-Lead (Completed)   Atrial fibrillation with RVR ([place]) - Primary    Rate controlled now.  She is back on 240 mg diltiazem with adequate control. If she is having more frequent episodes of tachycardia, may want to consider digoxin for additional baseline rate control.      Relevant Orders   EKG 12-Lead (Completed)   Chronic atrial fibrillation (HCC) (Chronic)    Continued rate over rhythm control.  She really has issues with tachycardia during illnesses.  That is not unexpected. She is currently anticoagulated with warfarin and has had issues with INR's being difficult control, and limiting what she eats.  There is also concern with antibiotics interactions etc.  We talked briefly about considering switching to DOAC such as Eliquis or Xarelto.  I do not see any interaction with any of her meds that would cause this to be a problem.  I recommend that she discuss with her PCP has been following her along.      Edema of both legs (Chronic)    Usually controlled with her diuretic, this is chronic.  Support stockings usually help when she wears them.  We talked about sliding scale Lasix.  It seems to be more venous stasis then  heart failure.      Hypertension (Chronic)    Stable blood pressure and current meds.  No change.      Left ventricular diastolic dysfunction, NYHA class 2 (Chronic)    Unfortunately cannot really determine diastolic dysfunction with A. fib on echo.  She seemed to be doing okay and is not really having any heart failure symptoms.  We will simply continue the Lasix that she is using for her  edema as well as spironolactone for additional afterload reduction and diuretic.  If her blood pressure were higher, we could consider afterload reduction with ARB or ACE inhibitor.      Non-rheumatic aortic sclerosis (HCC) (Chronic)    Aortic sclerosis on echo.  Unlikely to be getting worse.         Current medicines are reviewed at length with the patient today. (+/- concerns) n/a The following changes have been made: n/a  Patient Instructions  No change with current medications   Discuss with DR Maudie Mercury, ABOUT TAKING [place] .   CONTINUE WITH DILTIAZEM.    Your physician wants you to follow-up in [place].You will receive a reminder letter in the mail two months in advance. If you don't receive a letter, please call our office to schedule the follow-up appointment.   If you need a refill on your cardiac medications before your next appointment, please call your pharmacy.    Studies Ordered:   Orders Placed This Encounter  Procedures  . EKG 12-Lead      Glenetta Hew, M.D., M.S. Interventional Cardiologist   Pager # [phone] Phone # [phone] [address] [place], [place] [address]   Thank you for choosing [facility] at [facility]!!

## 2018-02-10 ENCOUNTER — Encounter: Payer: Self-pay | Admitting: Cardiology

## 2018-02-10 NOTE — Assessment & Plan Note (Signed)
Rate controlled now.  She is back on 240 mg diltiazem with adequate control. If she is having more frequent episodes of tachycardia, may want to consider digoxin for additional baseline rate control.

## 2018-02-10 NOTE — Assessment & Plan Note (Signed)
Continued rate over rhythm control.  She really has issues with tachycardia during illnesses.  That is not unexpected. She is currently anticoagulated with warfarin and has had issues with INR's being difficult control, and limiting what she eats.  There is also concern with antibiotics interactions etc.  We talked briefly about considering switching to DOAC such as Eliquis or Xarelto.  I do not see any interaction with any of her meds that would cause this to be a problem.  I recommend that she discuss with her PCP has been following her along.

## 2018-02-10 NOTE — Assessment & Plan Note (Signed)
Unfortunately cannot really determine diastolic dysfunction with A. fib on echo.  She seemed to be doing okay and is not really having any heart failure symptoms.  We will simply continue the Lasix that she is using for her edema as well as spironolactone for additional afterload reduction and diuretic.  If her blood pressure were higher, we could consider afterload reduction with ARB or ACE inhibitor.

## 2018-02-10 NOTE — Assessment & Plan Note (Signed)
Was noted back in December 2015.  Should be due for follow-up Doppler in September 2019. If not ordered by PCP prior to my next visit, we can we can check it then.

## 2018-02-10 NOTE — Assessment & Plan Note (Signed)
Aortic sclerosis on echo.  Unlikely to be getting worse.

## 2018-02-10 NOTE — Assessment & Plan Note (Signed)
Stable blood pressure and current meds.  No change.

## 2018-02-10 NOTE — Assessment & Plan Note (Signed)
Usually controlled with her diuretic, this is chronic.  Support stockings usually help when she wears them.  We talked about sliding scale Lasix.  It seems to be more venous stasis then heart failure.

## 2018-03-13 DIAGNOSIS — I10 Essential (primary) hypertension: Secondary | ICD-10-CM | POA: Diagnosis not present

## 2018-03-13 DIAGNOSIS — I4891 Unspecified atrial fibrillation: Secondary | ICD-10-CM | POA: Diagnosis not present

## 2018-03-13 DIAGNOSIS — R739 Hyperglycemia, unspecified: Secondary | ICD-10-CM | POA: Diagnosis not present

## 2018-03-20 DIAGNOSIS — I10 Essential (primary) hypertension: Secondary | ICD-10-CM | POA: Diagnosis not present

## 2018-03-20 DIAGNOSIS — R05 Cough: Secondary | ICD-10-CM | POA: Diagnosis not present

## 2018-03-20 DIAGNOSIS — R739 Hyperglycemia, unspecified: Secondary | ICD-10-CM | POA: Diagnosis not present

## 2018-03-20 DIAGNOSIS — R609 Edema, unspecified: Secondary | ICD-10-CM | POA: Diagnosis not present

## 2018-03-20 DIAGNOSIS — I4891 Unspecified atrial fibrillation: Secondary | ICD-10-CM | POA: Diagnosis not present

## 2018-03-20 DIAGNOSIS — G2581 Restless legs syndrome: Secondary | ICD-10-CM | POA: Diagnosis not present

## 2018-03-20 DIAGNOSIS — J189 Pneumonia, unspecified organism: Secondary | ICD-10-CM | POA: Diagnosis not present

## 2018-03-21 ENCOUNTER — Other Ambulatory Visit: Payer: Self-pay | Admitting: Internal Medicine

## 2018-03-21 DIAGNOSIS — I4891 Unspecified atrial fibrillation: Secondary | ICD-10-CM

## 2018-03-22 ENCOUNTER — Ambulatory Visit (HOSPITAL_COMMUNITY): Payer: PPO | Attending: Internal Medicine

## 2018-03-22 ENCOUNTER — Other Ambulatory Visit: Payer: Self-pay

## 2018-03-22 DIAGNOSIS — I4891 Unspecified atrial fibrillation: Secondary | ICD-10-CM | POA: Diagnosis not present

## 2018-03-22 DIAGNOSIS — Z86718 Personal history of other venous thrombosis and embolism: Secondary | ICD-10-CM | POA: Insufficient documentation

## 2018-03-22 DIAGNOSIS — I10 Essential (primary) hypertension: Secondary | ICD-10-CM | POA: Diagnosis not present

## 2018-04-17 DIAGNOSIS — Z0000 Encounter for general adult medical examination without abnormal findings: Secondary | ICD-10-CM | POA: Diagnosis not present

## 2018-04-17 DIAGNOSIS — K219 Gastro-esophageal reflux disease without esophagitis: Secondary | ICD-10-CM | POA: Diagnosis not present

## 2018-04-17 DIAGNOSIS — R739 Hyperglycemia, unspecified: Secondary | ICD-10-CM | POA: Diagnosis not present

## 2018-04-17 DIAGNOSIS — J189 Pneumonia, unspecified organism: Secondary | ICD-10-CM | POA: Diagnosis not present

## 2018-04-17 DIAGNOSIS — I10 Essential (primary) hypertension: Secondary | ICD-10-CM | POA: Diagnosis not present

## 2018-04-17 DIAGNOSIS — I4891 Unspecified atrial fibrillation: Secondary | ICD-10-CM | POA: Diagnosis not present

## 2018-05-15 DIAGNOSIS — N3941 Urge incontinence: Secondary | ICD-10-CM | POA: Diagnosis not present

## 2018-05-15 DIAGNOSIS — Z8551 Personal history of malignant neoplasm of bladder: Secondary | ICD-10-CM | POA: Diagnosis not present

## 2018-05-23 DIAGNOSIS — G2581 Restless legs syndrome: Secondary | ICD-10-CM | POA: Diagnosis not present

## 2018-05-23 DIAGNOSIS — E7800 Pure hypercholesterolemia, unspecified: Secondary | ICD-10-CM | POA: Diagnosis not present

## 2018-05-23 DIAGNOSIS — I10 Essential (primary) hypertension: Secondary | ICD-10-CM | POA: Diagnosis not present

## 2018-05-23 DIAGNOSIS — R739 Hyperglycemia, unspecified: Secondary | ICD-10-CM | POA: Diagnosis not present

## 2018-05-25 DIAGNOSIS — I10 Essential (primary) hypertension: Secondary | ICD-10-CM | POA: Diagnosis not present

## 2018-05-25 DIAGNOSIS — Z7901 Long term (current) use of anticoagulants: Secondary | ICD-10-CM | POA: Diagnosis not present

## 2018-05-25 DIAGNOSIS — I4891 Unspecified atrial fibrillation: Secondary | ICD-10-CM | POA: Diagnosis not present

## 2018-06-22 DIAGNOSIS — I4891 Unspecified atrial fibrillation: Secondary | ICD-10-CM | POA: Diagnosis not present

## 2018-06-22 DIAGNOSIS — Z7901 Long term (current) use of anticoagulants: Secondary | ICD-10-CM | POA: Diagnosis not present

## 2018-06-22 DIAGNOSIS — K219 Gastro-esophageal reflux disease without esophagitis: Secondary | ICD-10-CM | POA: Diagnosis not present

## 2018-06-22 DIAGNOSIS — I10 Essential (primary) hypertension: Secondary | ICD-10-CM | POA: Diagnosis not present

## 2018-07-20 DIAGNOSIS — Z7901 Long term (current) use of anticoagulants: Secondary | ICD-10-CM | POA: Diagnosis not present

## 2018-07-20 DIAGNOSIS — I4891 Unspecified atrial fibrillation: Secondary | ICD-10-CM | POA: Diagnosis not present

## 2018-08-17 DIAGNOSIS — Z7901 Long term (current) use of anticoagulants: Secondary | ICD-10-CM | POA: Diagnosis not present

## 2018-08-17 DIAGNOSIS — I4891 Unspecified atrial fibrillation: Secondary | ICD-10-CM | POA: Diagnosis not present

## 2018-08-21 DIAGNOSIS — R739 Hyperglycemia, unspecified: Secondary | ICD-10-CM | POA: Diagnosis not present

## 2018-08-21 DIAGNOSIS — I10 Essential (primary) hypertension: Secondary | ICD-10-CM | POA: Diagnosis not present

## 2018-08-25 DIAGNOSIS — I714 Abdominal aortic aneurysm, without rupture: Secondary | ICD-10-CM | POA: Diagnosis not present

## 2018-08-25 DIAGNOSIS — I4891 Unspecified atrial fibrillation: Secondary | ICD-10-CM | POA: Diagnosis not present

## 2018-08-25 DIAGNOSIS — Z7901 Long term (current) use of anticoagulants: Secondary | ICD-10-CM | POA: Diagnosis not present

## 2018-08-25 DIAGNOSIS — I10 Essential (primary) hypertension: Secondary | ICD-10-CM | POA: Diagnosis not present

## 2018-08-25 DIAGNOSIS — R739 Hyperglycemia, unspecified: Secondary | ICD-10-CM | POA: Diagnosis not present

## 2018-08-25 DIAGNOSIS — R609 Edema, unspecified: Secondary | ICD-10-CM | POA: Diagnosis not present

## 2018-08-30 ENCOUNTER — Other Ambulatory Visit: Payer: Self-pay

## 2018-08-30 ENCOUNTER — Observation Stay (HOSPITAL_COMMUNITY)
Admission: EM | Admit: 2018-08-30 | Discharge: 2018-08-31 | Disposition: A | Discharge status: Home Health | Payer: PPO | Attending: Internal Medicine | Admitting: Internal Medicine

## 2018-08-30 ENCOUNTER — Emergency Department (HOSPITAL_COMMUNITY): Payer: PPO

## 2018-08-30 ENCOUNTER — Encounter (HOSPITAL_COMMUNITY): Payer: Self-pay

## 2018-08-30 ENCOUNTER — Observation Stay (HOSPITAL_COMMUNITY): Payer: PPO

## 2018-08-30 DIAGNOSIS — M79602 Pain in left arm: Secondary | ICD-10-CM | POA: Insufficient documentation

## 2018-08-30 DIAGNOSIS — I482 Chronic atrial fibrillation, unspecified: Secondary | ICD-10-CM | POA: Diagnosis present

## 2018-08-30 DIAGNOSIS — R072 Precordial pain: Secondary | ICD-10-CM | POA: Diagnosis present

## 2018-08-30 DIAGNOSIS — Z87891 Personal history of nicotine dependence: Secondary | ICD-10-CM | POA: Diagnosis not present

## 2018-08-30 DIAGNOSIS — Z79899 Other long term (current) drug therapy: Secondary | ICD-10-CM | POA: Insufficient documentation

## 2018-08-30 DIAGNOSIS — I5032 Chronic diastolic (congestive) heart failure: Secondary | ICD-10-CM | POA: Insufficient documentation

## 2018-08-30 DIAGNOSIS — R51 Headache: Secondary | ICD-10-CM | POA: Diagnosis not present

## 2018-08-30 DIAGNOSIS — I714 Abdominal aortic aneurysm, without rupture, unspecified: Secondary | ICD-10-CM | POA: Diagnosis present

## 2018-08-30 DIAGNOSIS — R4781 Slurred speech: Secondary | ICD-10-CM | POA: Insufficient documentation

## 2018-08-30 DIAGNOSIS — Z9181 History of falling: Secondary | ICD-10-CM | POA: Insufficient documentation

## 2018-08-30 DIAGNOSIS — R200 Anesthesia of skin: Secondary | ICD-10-CM | POA: Diagnosis not present

## 2018-08-30 DIAGNOSIS — K219 Gastro-esophageal reflux disease without esophagitis: Secondary | ICD-10-CM | POA: Insufficient documentation

## 2018-08-30 DIAGNOSIS — Z8551 Personal history of malignant neoplasm of bladder: Secondary | ICD-10-CM | POA: Insufficient documentation

## 2018-08-30 DIAGNOSIS — R079 Chest pain, unspecified: Secondary | ICD-10-CM

## 2018-08-30 DIAGNOSIS — I2510 Atherosclerotic heart disease of native coronary artery without angina pectoris: Secondary | ICD-10-CM | POA: Diagnosis not present

## 2018-08-30 DIAGNOSIS — Z86718 Personal history of other venous thrombosis and embolism: Secondary | ICD-10-CM | POA: Insufficient documentation

## 2018-08-30 DIAGNOSIS — I4891 Unspecified atrial fibrillation: Secondary | ICD-10-CM | POA: Diagnosis present

## 2018-08-30 DIAGNOSIS — I110 Hypertensive heart disease with heart failure: Secondary | ICD-10-CM | POA: Insufficient documentation

## 2018-08-30 DIAGNOSIS — R0789 Other chest pain: Principal | ICD-10-CM | POA: Insufficient documentation

## 2018-08-30 DIAGNOSIS — Z96651 Presence of right artificial knee joint: Secondary | ICD-10-CM | POA: Insufficient documentation

## 2018-08-30 DIAGNOSIS — I4821 Permanent atrial fibrillation: Secondary | ICD-10-CM | POA: Diagnosis present

## 2018-08-30 DIAGNOSIS — M069 Rheumatoid arthritis, unspecified: Secondary | ICD-10-CM | POA: Insufficient documentation

## 2018-08-30 DIAGNOSIS — Z7901 Long term (current) use of anticoagulants: Secondary | ICD-10-CM | POA: Insufficient documentation

## 2018-08-30 DIAGNOSIS — C679 Malignant neoplasm of bladder, unspecified: Secondary | ICD-10-CM | POA: Diagnosis present

## 2018-08-30 DIAGNOSIS — I10 Essential (primary) hypertension: Secondary | ICD-10-CM | POA: Diagnosis present

## 2018-08-30 LAB — CBC
HEMATOCRIT: 40.5 % (ref 36.0–46.0)
Hemoglobin: 13.3 g/dL (ref 12.0–15.0)
MCH: 32.1 pg (ref 26.0–34.0)
MCHC: 32.8 g/dL (ref 30.0–36.0)
MCV: 97.8 fL (ref 78.0–100.0)
Platelets: 238 10*3/uL (ref 150–400)
RBC: 4.14 MIL/uL (ref 3.87–5.11)
RDW: 12.5 % (ref 11.5–15.5)
WBC: 6.6 10*3/uL (ref 4.0–10.5)

## 2018-08-30 LAB — BASIC METABOLIC PANEL
ANION GAP: 12 (ref 5–15)
BUN: 8 mg/dL (ref 8–23)
CHLORIDE: 100 mmol/L (ref 98–111)
CO2: 24 mmol/L (ref 22–32)
Calcium: 9.3 mg/dL (ref 8.9–10.3)
Creatinine, Ser: 0.83 mg/dL (ref 0.44–1.00)
GFR calc Af Amer: >60 mL/min (ref >60)
GFR, EST NON AFRICAN AMERICAN: 59 mL/min — AB (ref >60)
GLUCOSE: 177 mg/dL — AB (ref 70–99)
POTASSIUM: 3.5 mmol/L (ref 3.5–5.1)
Sodium: 136 mmol/L (ref 135–145)

## 2018-08-30 LAB — I-STAT TROPONIN, ED: Troponin i, poc: 0.01 ng/mL (ref 0.00–0.08)

## 2018-08-30 LAB — PROTIME-INR
INR: 2.21
PROTHROMBIN TIME: 24.4 s — AB (ref 11.4–15.2)

## 2018-08-30 LAB — TROPONIN I

## 2018-08-30 MED ORDER — SPIRONOLACTONE 25 MG PO TABS
25.0000 mg | ORAL_TABLET | ORAL | Status: DC
  Administered 2018-08-31: 25 mg via ORAL
  Filled 2018-08-30: qty 1

## 2018-08-30 MED ORDER — ASPIRIN 325 MG PO TABS
325.0000 mg | ORAL_TABLET | Freq: Once | ORAL | Status: AC
  Administered 2018-08-30: 325 mg via ORAL
  Filled 2018-08-30: qty 1

## 2018-08-30 MED ORDER — ONDANSETRON HCL 4 MG PO TABS: 4.0000 mg | ORAL_TABLET | Freq: Four times a day (QID) | ORAL | Status: DC | PRN

## 2018-08-30 MED ORDER — ACETAMINOPHEN 650 MG RE SUPP: 650.0000 mg | Freq: Four times a day (QID) | RECTAL | Status: DC | PRN

## 2018-08-30 MED ORDER — WARFARIN SODIUM 5 MG PO TABS
5.0000 mg | ORAL_TABLET | Freq: Once | ORAL | Status: DC
  Filled 2018-08-30: qty 1

## 2018-08-30 MED ORDER — POLYETHYLENE GLYCOL 3350 17 G PO PACK: 17.0000 g | PACK | Freq: Every day | ORAL | Status: DC | PRN

## 2018-08-30 MED ORDER — ACETAMINOPHEN 325 MG PO TABS
650.0000 mg | ORAL_TABLET | Freq: Four times a day (QID) | ORAL | Status: DC | PRN
  Filled 2018-08-30: qty 2

## 2018-08-30 MED ORDER — ONDANSETRON HCL 4 MG/2ML IJ SOLN: 4.0000 mg | Freq: Four times a day (QID) | INTRAMUSCULAR | Status: DC | PRN

## 2018-08-30 MED ORDER — POTASSIUM CHLORIDE CRYS ER 20 MEQ PO TBCR
40.0000 meq | EXTENDED_RELEASE_TABLET | Freq: Once | ORAL | Status: AC
  Administered 2018-08-30: 40 meq via ORAL
  Filled 2018-08-30: qty 2

## 2018-08-30 MED ORDER — FUROSEMIDE 20 MG PO TABS
20.0000 mg | ORAL_TABLET | Freq: Every day | ORAL | Status: DC
  Administered 2018-08-31: 20 mg via ORAL
  Filled 2018-08-30 (×3): qty 1

## 2018-08-30 MED ORDER — PANTOPRAZOLE SODIUM 40 MG PO TBEC
40.0000 mg | DELAYED_RELEASE_TABLET | Freq: Every day | ORAL | Status: DC
  Administered 2018-08-30 – 2018-08-31 (×2): 40 mg via ORAL
  Filled 2018-08-30 (×3): qty 1

## 2018-08-30 MED ORDER — WARFARIN - PHARMACIST DOSING INPATIENT: Freq: Every day | Status: DC

## 2018-08-30 MED ORDER — TRAMADOL HCL 50 MG PO TABS
50.0000 mg | ORAL_TABLET | Freq: Once | ORAL | Status: AC
  Administered 2018-08-30: 50 mg via ORAL
  Filled 2018-08-30: qty 1

## 2018-08-30 MED ORDER — WARFARIN - PHYSICIAN DOSING INPATIENT: Freq: Every day | Status: DC

## 2018-08-30 MED ORDER — DILTIAZEM HCL ER COATED BEADS 240 MG PO CP24
240.0000 mg | ORAL_CAPSULE | Freq: Every day | ORAL | Status: DC
  Administered 2018-08-31: 240 mg via ORAL
  Filled 2018-08-30: qty 2
  Filled 2018-08-30: qty 1

## 2018-08-30 MED ORDER — WARFARIN SODIUM 5 MG PO TABS: 5.0000 mg | ORAL_TABLET | Freq: Once | ORAL | Status: DC

## 2018-08-30 NOTE — H&P (Addendum)
History and Physical    [name] OAC:[id number] DOB: 05-Oct-1921 DOA: 08/30/2018  PCP: [name], MD   Patient coming from: Home  Chief Complaint: Chest pain, left arm tingling  HPI: [name] is a 84 y.o. female with medical history significant of chronic atrial fibrillation on chronic anticoagulation, HTN, diastolic dysfunction, AAA, bladder cancer, Rheumatoid arthritis.  Patient reports she was eating a [employer] today when suddenly she started having right arm numbness tingling, elbow pain that radiated to her neck and left chest causing chest pain.  Describes the pain as sharp.  Denies associated shortness of breath diaphoresis nausea.  She also reports an associated headache at the top of her head. Daughter reports intermittent mild slurred speech since yesterday, patient falling to her right side, also over the past 24 hours but this was worse today.  Patient denies heaviness of her extremities. Denies facial droop.  At the time of my evaluation patient reports persistence of right hand numbness, but slurred speech has since resolved.   At baseline patient is fairly functional, ambulates with a cane.    ED Course: Systolic blood pressure 010X to 160s,  otherwise stable vitals.  I-STAT troponin negative.  Unremarkable BMP CBC.  INR therapeutic at 2.2.  Two-view chest x-ray emphysema, negative for acute abnormality.  Head CT-negative for acute intracranial abnormality, atrophy and mild small vessel ischemic changes of the white matter.  Hospitlaist was called to admit for chest pain rule out ACS.  Review of Systems: As per HPI  All other systems reviewed and negative.  Past Medical History:  Diagnosis Date  . AAA (abdominal aortic aneurysm) ([place])   . Anxiety   . Bladder cancer ([place]) dx'd 2011   Chronic microscopic hematuria; transitional cell cancer  . Blood transfusion 1960   "related to hysterectomy"  . Chronic atrial fibrillation (HCC)    Anticoagulated  with warfarin, rate control with diltiazem and Toprol  . Chronic back pain   . Chronic bronchitis    "get it basically 3 times/yr"  . Chronic headache    "probably 2/wk" (08/28/2015)  . Concussion w/o coma 03/04/5572   Complicated by subarachnoid hemorrhage.  "even now has times when she's not able to comprehend" (05/08/12)  . Coronary artery disease    Nonobstructive  . Diverticulitis   . Diverticulosis   . DVT (deep venous thrombosis), right 2005   "right calf after 8 foot fall"  . Edema of both legs    Chronic, thought to be secondary to DVTs  . External hemorrhoids   . Family history of adverse reaction to anesthesia 2012   daughter "had OR for crushed hand; had problems w/anesthesia & I was in there all day long" (  . Fatty liver   . Frequent UTI   . GERD (gastroesophageal reflux disease)   . H/O hiatal hernia   . High cholesterol   . Migraine    "rare now" (08/28/2015)  . Pneumonia ~ 2010;  2013; 07/2015  . Rheumatoid arthritis(714.0)    "hands" (08/28/2015)  . Urinary frequency     Past Surgical History:  Procedure Laterality Date  . BREAST CYST EXCISION Left 1960's?   2 cysts; benign  . CARDIAC CATHETERIZATION  1980's  . CATARACT EXTRACTION W/ INTRAOCULAR LENS  IMPLANT, BILATERAL Bilateral 1992  . CYSTOSCOPY  11/11/2011   Procedure: CYSTOSCOPY;  Surgeon: Malka So;  Location: [facility];  Service: Urology;  Laterality: N/A;  . CYSTOSTOMY W/ BLADDER BIOPSY  10/08/2013  .  INCONTINENCE SURGERY  1980's  . JOINT REPLACEMENT    . KNEE ARTHROSCOPY Right 2005   S/P fall  . Lower Extremity Venous Doppler  11/10/2013   No DVT or superficial thrombus enlarged inguinal lymph node noted in the right. No Baker's cyst.  . TONSILLECTOMY  1952  . TOTAL KNEE ARTHROPLASTY Right 02/05/2013   Procedure: TOTAL KNEE ARTHROPLASTY;  Surgeon: Mauri Pole, MD;  Location: [facility];  Service: Orthopedics;  Laterality: Right;  . TRANSTHORACIC ECHOCARDIOGRAM  12/2013   EF 60-65%. NO RWMA. Ao  Sclerosis. MAC - no MS with mild MR>  B Atriae mildly dilated.  . TRANSURETHRAL RESECTION OF BLADDER TUMOR  11/11/2011   Procedure: TRANSURETHRAL RESECTION OF BLADDER TUMOR (TURBT);  Surgeon: Malka So;  Location: [facility];  Service: Urology;  Laterality: N/A;  Cysto, Bladder Biopsy, TURBT with [place],   . TRANSURETHRAL RESECTION OF BLADDER TUMOR WITH GYRUS (TURBT-GYRUS)  2007; 2009; 2010   "for tumors on surface of bladder"  . TUMOR EXCISION  1976; 1980's   "fatty tumor cut off her upper back; left thumb"  . VAGINAL HYSTERECTOMY  1960   partial      reports that she quit smoking about 35 years ago. Her smoking use included cigarettes. She has a 40.00 pack-year smoking history. She has quit using smokeless tobacco.  Her smokeless tobacco use included snuff. She reports that she drinks about 7.0 standard drinks of alcohol per week. She reports that she does not use drugs.  Allergies  Allergen Reactions  . Contrast Media [Iodinated Diagnostic Agents] Hives  . Amoxicillin Itching  . Ampicillin Itching  . Levaquin [Levofloxacin] Itching and Rash  . Penicillins Rash    "haven't had it in years"  . Vancomycin Itching and Rash    Family History  Problem Relation Age of Onset  . Anesthesia problems Daughter   . Prostate cancer Brother   . Breast cancer Other        neice  . Heart disease Brother   . Colon cancer Neg Hx     Prior to Admission medications   Medication Sig Start Date End Date Taking? Authorizing Provider  bifidobacterium infantis (ALIGN) capsule Take 1 capsule by mouth daily.   Yes [provider]  calcium carbonate (OS-CAL - DOSED IN MG OF ELEMENTAL CALCIUM) 1250 MG tablet Take 1 tablet by mouth every morning.    Yes [provider]  cholecalciferol (VITAMIN D) 1000 UNITS tablet Take 1,000 Units by mouth daily.   Yes [provider]  Cinnamon 500 MG capsule Take 1,000 mg by mouth daily.   Yes [provider]  diltiazem (CARDIZEM CD)  240 MG 24 hr capsule Take 1 capsule (240 mg total) by mouth daily. 09/02/15  Yes [name], MD  furosemide (LASIX) 20 MG tablet Take 1 tablet (20 mg total) by mouth daily. Patient taking differently: Take 20-40 mg by mouth daily. Take one tablet (20MG) by mouth daily except on Mon, Wed, Friday take 2 tablets (40 MG) 09/02/15  Yes [name], MD  Multiple Vitamins-Minerals (MULTIVITAMINS THER. W/MINERALS) TABS Take 1 tablet by mouth daily.    Yes [provider]  Omega-3 Fatty Acids (FISH OIL) 1000 MG CAPS Take 1,000 mg by mouth daily.   Yes [provider]  omeprazole (PRILOSEC) 20 MG capsule Take 20 mg by mouth 2 (two) times daily.   Yes [provider]  polyethylene glycol (MIRALAX / GLYCOLAX) packet Take 17 g by mouth daily as needed  for moderate constipation. 09/02/15  Yes [name], MD  psyllium (METAMUCIL) 58.6 % powder Take 1 packet by mouth 2 (two) times daily.    Yes [provider]  spironolactone (ALDACTONE) 25 MG tablet Take 25 mg by mouth every morning.   Yes [provider]  warfarin (COUMADIN) 5 MG tablet Take 2.5-5 mg by mouth See admin instructions. Takes 1 tablet (5 mg) daily EXCEPT for 0.5 tablet (2.5 mg) on Tuesdays and Saturdays 05/23/14  Yes [provider]    Physical Exam: Vitals:   08/30/18 1745 08/30/18 1800 08/30/18 1815 08/30/18 1845  BP: 131/74 (!) 144/60 (!) 147/75 (!) 151/69  Pulse: (!) 56 81 82 77  Resp: 16 (!) 21 (!) 25 (!) 23  Temp:      TempSrc:      SpO2: 95% 95% 96% 96%    Constitutional: NAD, calm, comfortable Vitals:   08/30/18 1745 08/30/18 1800 08/30/18 1815 08/30/18 1845  BP: 131/74 (!) 144/60 (!) 147/75 (!) 151/69  Pulse: (!) 56 81 82 77  Resp: 16 (!) 21 (!) 25 (!) 23  Temp:      TempSrc:      SpO2: 95% 95% 96% 96%   Eyes:  Left pupil abnormal shape- chronic, 2/2 cataract surgery, lids and conjunctivae normal ENMT: Mucous membranes are moist. Posterior pharynx clear of any exudate or  lesions.Normal dentition.  Neck: normal, supple, no masses, no thyromegaly Respiratory: clear to auscultation bilaterally, no wheezing, no crackles. Normal respiratory effort. No accessory muscle use.  Cardiovascular: Irregular rate and rhythm, no murmurs / rubs / gallops.  Wearing compression stockings to knees. Abdomen: no tenderness, no masses palpated. No hepatosplenomegaly. Bowel sounds positive.  Musculoskeletal: no clubbing / cyanosis. Arthritic changes to hands Good ROM, no contractures. Normal muscle tone.  Skin: no rashes, lesions, ulcers. No induration Neurologic: CN 2-12 grossly intact. Sensation intact, DTR normal. Strength 5/5 in all 4.  Normal finger-to-nose test.  Psychiatric: Normal judgment and insight. Alert and oriented x 3. Normal mood.   Labs on Admission: I have personally reviewed following labs and imaging studies  CBC: Recent Labs  Lab 08/30/18 1440  WBC 6.6  HGB 13.3  HCT 40.5  MCV 97.8  PLT 322   Basic Metabolic Panel: Recent Labs  Lab 08/30/18 1440  NA 136  K 3.5  CL 100  CO2 24  GLUCOSE 177*  BUN 8  CREATININE 0.83  CALCIUM 9.3   Coagulation Profile: Recent Labs  Lab 08/30/18 1715  INR 2.21   Urine analysis:    Component Value Date/Time   COLORURINE YELLOW 08/14/2015 0057   APPEARANCEUR CLEAR 08/14/2015 0057   LABSPEC 1.010 08/14/2015 0057   PHURINE 7.5 08/14/2015 0057   GLUCOSEU NEGATIVE 08/14/2015 0057   HGBUR LARGE (A) 08/14/2015 0057   BILIRUBINUR NEGATIVE 08/14/2015 0057   KETONESUR NEGATIVE 08/14/2015 0057   PROTEINUR 30 (A) 08/14/2015 0057   UROBILINOGEN 0.2 08/14/2015 0057   NITRITE NEGATIVE 08/14/2015 0057   LEUKOCYTESUR NEGATIVE 08/14/2015 0057    Radiological Exams on Admission: Dg Chest 2 View  Result Date: 08/30/2018 CLINICAL DATA:  Chest pain. Left arm tingling and left-sided jaw pain. EXAM: CHEST - 2 VIEW COMPARISON:  03/20/2018 and 01/31/2018 FINDINGS: There is chronic cardiomegaly with a prominent left  pericardial fat pad. Tortuosity and calcification of the thoracic aorta. Slight chronic accentuation of the interstitial markings without infiltrates or effusions. The lungs appear hyperinflated consistent with emphysema. Diffuse osteopenia. No acute bone abnormality. IMPRESSION: 1. No acute  abnormality. 2. Mild chronic cardiomegaly. 3.  Aortic Atherosclerosis (ICD10-I70.0). 4. Hyperinflated lungs suggesting emphysema. Electronically Signed   By: Lorriane Shire M.D.   On: 08/30/2018 15:56   Ct Head Wo Contrast  Result Date: 08/30/2018 CLINICAL DATA:  Headache EXAM: CT HEAD WITHOUT CONTRAST TECHNIQUE: Contiguous axial images were obtained from the base of the skull through the vertex without intravenous contrast. COMPARISON:  CT 08/26/2016, 07/09/2014 FINDINGS: Brain: No acute territorial infarction, hemorrhage or intracranial mass. Atrophy with small vessel ischemic changes of the white matter. Chronic lacunar infarct in the right basal ganglia. Stable ventricle size Vascular: No hyperdense vessels. Vertebral and carotid vascular calcification Skull: Normal. Negative for fracture or focal lesion. Sinuses/Orbits: No acute finding. Other: None. IMPRESSION: 1. No CT evidence for acute intracranial abnormality. 2. Atrophy and mild small vessel ischemic changes of the white matter Electronically Signed   By: Donavan Foil M.D.   On: 08/30/2018 17:13    EKG: Independently reviewed.  Atrial fibrillation. Hard to tell if nonspecific T wave change 2, 3 aVF.,  Due to fibrillation waves. Normal intervals.  Assessment/Plan Principal Problem:   Chest pain Active Problems:   Hypertension   Bladder cancer (HCC)   Chronic atrial fibrillation (HCC)   AAA (abdominal aortic aneurysm) (HCC)   Chest pain- Heart score ~4. No CAD hx. I -stat troponin negative, EKG ?nonspecific T wave changes 2 3 aVF.Last echo 03/2018-EF 55 to 60%, severely dilated right atrium, unable to assess diastolic function.  Moderate risk factors.   No prior stress test.  INR therapeutic 2.2 -Trend troponins x3 -EKG a.m. - [facility] Echo for now - NPO midnight - Aspirin 325 x 1  Falls- to the right, per daughter this is worse today, Mild intermittent slurred speech since yesterday, also reports left arm numbness and tingling.  Head CT atrophy, mild ischemic changes. Suspicion at this time is low with negative objective findings, therapeutic INR. -MRI brain -PT evaluation -Lipid panel A1c a.m.  Chronic atrial fibrillation-rate controlled on diltiazem and anticoagulated on warfarin. -Continue diltiazem, warfarin per pharmacy  AAA-follows with Dr. Ellyn Hack. On surveillance. Dec 2015- 3- 3.2 cm in diameter. Per his notes, patient due for outpatient Doppler September 2019.  -F/u as outpatient   DVT prophylaxis: Warfarin Code Status: Full Family Communication: Daughters- 2 at bedside Disposition Plan: Per rounding team Consults called: None Admission status: Obs, tele   Bethena Roys MD [facility] Pager [phone] From 6PM-2AM.  Otherwise please contact night-coverage [email or web address] Password [facility]  08/30/2018, 8:42 PM

## 2018-08-30 NOTE — Progress Notes (Signed)
ANTICOAGULATION CONSULT NOTE - Initial Consult  Pharmacy Consult for warfarin  Indication: atrial fibrillation  Allergies  Allergen Reactions  . Contrast Media [Iodinated Diagnostic Agents] Hives  . Amoxicillin Itching  . Ampicillin Itching  . Levaquin [Levofloxacin] Itching and Rash  . Penicillins Rash    "haven't had it in years"  . Vancomycin Itching and Rash    Patient Measurements:   Heparin Dosing Weight:   Vital Signs: Temp: 97.8 F (36.6 C) (09/18 1422) Temp Source: Oral (09/18 1422) BP: 151/69 (09/18 1845) Pulse Rate: 77 (09/18 1845)  Labs: Recent Labs    08/30/18 1440 08/30/18 1715  HGB 13.3  --   HCT 40.5  --   PLT 238  --   LABPROT  --  24.4*  INR  --  2.21  CREATININE 0.83  --     CrCl cannot be calculated (Unknown ideal weight.).   Medical History: Past Medical History:  Diagnosis Date  . AAA (abdominal aortic aneurysm) ([place])   . Anxiety   . Bladder cancer ([place]) dx'd 2011   Chronic microscopic hematuria; transitional cell cancer  . Blood transfusion 1960   "related to hysterectomy"  . Chronic atrial fibrillation (HCC)    Anticoagulated with warfarin, rate control with diltiazem and Toprol  . Chronic back pain   . Chronic bronchitis    "get it basically 3 times/yr"  . Chronic headache    "probably 2/wk" (08/28/2015)  . Concussion w/o coma 3/70/4888   Complicated by subarachnoid hemorrhage.  "even now has times when she's not able to comprehend" (05/08/12)  . Coronary artery disease    Nonobstructive  . Diverticulitis   . Diverticulosis   . DVT (deep venous thrombosis), right 2005   "right calf after 8 foot fall"  . Edema of both legs    Chronic, thought to be secondary to DVTs  . External hemorrhoids   . Family history of adverse reaction to anesthesia 2012   daughter "had OR for crushed hand; had problems w/anesthesia & I was in there all day long" (  . Fatty liver   . Frequent UTI   . GERD (gastroesophageal reflux disease)   .  H/O hiatal hernia   . High cholesterol   . Migraine    "rare now" (08/28/2015)  . Pneumonia ~ 2010;  2013; 07/2015  . Rheumatoid arthritis(714.0)    "hands" (08/28/2015)  . Urinary frequency     Assessment: 84 yo F with on chronic anticoagulation for Afib with warfarin admitted for chest pain. Pharmacy consulted to manage warfarin dosing.  INR 2.21, CBC WNL  PTA warfarin regimen: 5mg  daily, except 2.5 mg on Tues and Sat  Goal of Therapy:  INR 2-3 Monitor platelets by anticoagulation protocol: Yes   Plan:  Warfarin 5mg  x1 given in ED Daily INR, monitor for s/s of bleeding  Harrietta Guardian, PharmD PGY1 Pharmacy Resident 08/30/2018    7:20 PM

## 2018-08-30 NOTE — ED Notes (Signed)
ED Provider at bedside. 

## 2018-08-30 NOTE — ED Notes (Signed)
Family at bedside reports that "she hasen't been feeling good for a couple of days, complaining about her head for about 3 days now".

## 2018-08-30 NOTE — ED Provider Notes (Signed)
[facility] EMERGENCY DEPARTMENT Provider Note   CSN: [id number] Arrival date & time: 08/30/18  1412     History   Chief Complaint Chief Complaint  Patient presents with  . Arm Pain  . Chest Pain    HPI [name] is a 84 y.o. female with PMHx AAA, chronic afib on coumadin, bladder CA, HTN, presenting to the ED with complaint of sudden onset left arm pain and numbness that occurred around 1:30pm today. She states she was sitting at [place] and began feeling these symptoms. She states pain radiates to her left neck and chest. Chest pain felt like a pressure. Also reports HA.  Denies assoc SOB, diaphoresis, nausea, vision changes. Pt's daughter reports she began having slightly slurred speech that began around 8 PM last night and has been persistent since that time.  She states she also has been intermittently falling towards the right side, though patient denies any unilateral weakness to her right side.  No reported facial droop.  No history of stroke or MI.  She reports compliance with her Coumadin.   Per chart review, patient last evaluated by her cardiologist in February, Dr. Ellyn Hack.  Echocardiogram done in 2015 with EF of 60 to 65%.  No recent stress test.  The history is provided by the patient, a relative and medical records.    Past Medical History:  Diagnosis Date  . AAA (abdominal aortic aneurysm) ([place])   . Anxiety   . Bladder cancer ([place]) dx'd 2011   Chronic microscopic hematuria; transitional cell cancer  . Blood transfusion 1960   "related to hysterectomy"  . Chronic atrial fibrillation (HCC)    Anticoagulated with warfarin, rate control with diltiazem and Toprol  . Chronic back pain   . Chronic bronchitis    "get it basically 3 times/yr"  . Chronic headache    "probably 2/wk" (08/28/2015)  . Concussion w/o coma 06/09/3150   Complicated by subarachnoid hemorrhage.  "even now has times when she's not able to comprehend" (05/08/12)  .  Coronary artery disease    Nonobstructive  . Diverticulitis   . Diverticulosis   . DVT (deep venous thrombosis), right 2005   "right calf after 8 foot fall"  . Edema of both legs    Chronic, thought to be secondary to DVTs  . External hemorrhoids   . Family history of adverse reaction to anesthesia 2012   daughter "had OR for crushed hand; had problems w/anesthesia & I was in there all day long" (  . Fatty liver   . Frequent UTI   . GERD (gastroesophageal reflux disease)   . H/O hiatal hernia   . High cholesterol   . Migraine    "rare now" (08/28/2015)  . Pneumonia ~ 2010;  2013; 07/2015  . Rheumatoid arthritis(714.0)    "hands" (08/28/2015)  . Urinary frequency     Patient Active Problem List   Diagnosis Date Noted  . Chest pain 08/30/2018  . Reactive airway disease with acute exacerbation 01/28/2018  . Atrial fibrillation with RVR ([place]) 01/28/2018  . Left ventricular diastolic dysfunction, NYHA class 2 12/21/2013  . Non-rheumatic aortic sclerosis ([place]) 12/21/2013  . AAA (abdominal aortic aneurysm) ([place]) 12/21/2013  . 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 Chronic atrial fibrillation ([place])   . Edema of both legs   . 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 Chronic anticoagulation 05/10/2012  . PVD, moderate bilat carotid disease 05/10/2012  . Normal left ventricular systolic function, 2D 7/61 05/10/2012  .  Dyslipidemia 05/10/2012  . Hypertension   . GERD (gastroesophageal reflux disease)   . Bladder cancer ([place])   . Rheumatoid arthritis(714.0)   . Primary bladder malignant neoplasm ([place]) 11/10/2011  . COLONIC POLYPS, ADENOMATOUS 01/26/2008  . HEMORRHOIDS, EXTERNAL 01/26/2008  . GASTRITIS, CHRONIC 01/26/2008  . HIATAL HERNIA 01/26/2008  . DIVERTICULOSIS OF COLON 01/26/2008    Past Surgical History:  Procedure Laterality Date  . BREAST CYST EXCISION Left 1960's?   2 cysts; benign  . CARDIAC CATHETERIZATION  1980's  . CATARACT EXTRACTION W/ INTRAOCULAR LENS  IMPLANT,  BILATERAL Bilateral 1992  . CYSTOSCOPY  11/11/2011   Procedure: CYSTOSCOPY;  Surgeon: Malka So;  Location: [facility];  Service: Urology;  Laterality: N/A;  . CYSTOSTOMY W/ BLADDER BIOPSY  10/08/2013  . INCONTINENCE SURGERY  1980's  . JOINT REPLACEMENT    . KNEE ARTHROSCOPY Right 2005   S/P fall  . Lower Extremity Venous Doppler  11/10/2013   No DVT or superficial thrombus enlarged inguinal lymph node noted in the right. No Baker's cyst.  . TONSILLECTOMY  1952  . TOTAL KNEE ARTHROPLASTY Right 02/05/2013   Procedure: TOTAL KNEE ARTHROPLASTY;  Surgeon: Mauri Pole, MD;  Location: [facility];  Service: Orthopedics;  Laterality: Right;  . TRANSTHORACIC ECHOCARDIOGRAM  12/2013   EF 60-65%. NO RWMA. Ao Sclerosis. MAC - no MS with mild MR>  B Atriae mildly dilated.  . TRANSURETHRAL RESECTION OF BLADDER TUMOR  11/11/2011   Procedure: TRANSURETHRAL RESECTION OF BLADDER TUMOR (TURBT);  Surgeon: Malka So;  Location: [facility];  Service: Urology;  Laterality: N/A;  Cysto, Bladder Biopsy, TURBT with [place],   . TRANSURETHRAL RESECTION OF BLADDER TUMOR WITH GYRUS (TURBT-GYRUS)  2007; 2009; 2010   "for tumors on surface of bladder"  . TUMOR EXCISION  1976; 1980's   "fatty tumor cut off her upper back; left thumb"  . VAGINAL HYSTERECTOMY  1960   partial      OB History   None      Home Medications    Prior to Admission medications   Medication Sig Start Date End Date Taking? Authorizing Provider  bifidobacterium infantis (ALIGN) capsule Take 1 capsule by mouth daily.   Yes [provider]  calcium carbonate (OS-CAL - DOSED IN MG OF ELEMENTAL CALCIUM) 1250 MG tablet Take 1 tablet by mouth every morning.    Yes [provider]  cholecalciferol (VITAMIN D) 1000 UNITS tablet Take 1,000 Units by mouth daily.   Yes [provider]  Cinnamon 500 MG capsule Take 1,000 mg by mouth daily.   Yes [provider]  diltiazem (CARDIZEM CD) 240 MG 24 hr capsule Take 1 capsule  (240 mg total) by mouth daily. 09/02/15  Yes [name], MD  furosemide (LASIX) 20 MG tablet Take 1 tablet (20 mg total) by mouth daily. Patient taking differently: Take 20-40 mg by mouth daily. Take one tablet (20MG) by mouth daily except on Mon, Wed, Friday take 2 tablets (40 MG) 09/02/15  Yes [name], MD  Multiple Vitamins-Minerals (MULTIVITAMINS THER. W/MINERALS) TABS Take 1 tablet by mouth daily.    Yes [provider]  Omega-3 Fatty Acids (FISH OIL) 1000 MG CAPS Take 1,000 mg by mouth daily.   Yes [provider]  omeprazole (PRILOSEC) 20 MG capsule Take 20 mg by mouth 2 (two) times daily.   Yes [provider]  polyethylene glycol (MIRALAX / GLYCOLAX) packet Take 17 g by mouth daily as needed for moderate constipation. 09/02/15  Yes [name], MD  psyllium (METAMUCIL) 58.6 % powder Take 1 packet by mouth 2 (two) times daily.    Yes [provider]  spironolactone (ALDACTONE) 25 MG tablet Take 25 mg by mouth every morning.   Yes [provider]  warfarin (COUMADIN) 5 MG tablet Take 2.5-5 mg by mouth See admin instructions. Takes 1 tablet (5 mg) daily EXCEPT for 0.5 tablet (2.5 mg) on Tuesdays and Saturdays 05/23/14  Yes [provider]    Family History Family History  Problem Relation Age of Onset  . Anesthesia problems Daughter   . Prostate cancer Brother   . Breast cancer Other        neice  . Heart disease Brother   . Colon cancer Neg Hx     Social History Social History   Tobacco Use  . Smoking status: Former Smoker    Packs/day: 1.00    Years: 40.00    Pack years: 40.00    Types: Cigarettes    Last attempt to quit: 11/07/1982    Years since quitting: 35.8  . Smokeless tobacco: Former Systems developer    Types: Snuff  Substance Use Topics  . Alcohol use: Yes    Alcohol/week: 7.0 standard drinks    Types: 7 Glasses of wine per week    Comment: 1 glass of wine prior to bed  . Drug use: No     Allergies   Contrast media  [iodinated diagnostic agents]; Amoxicillin; Ampicillin; Levaquin [levofloxacin]; Penicillins; and Vancomycin   Review of Systems Review of Systems  Constitutional: Negative for diaphoresis and fever.  Eyes: Negative for visual disturbance.  Respiratory: Negative for shortness of breath.   Cardiovascular: Positive for chest pain. Negative for palpitations.  Gastrointestinal: Negative for abdominal pain, nausea and vomiting.  Neurological: Positive for speech difficulty, weakness and numbness. Negative for syncope, facial asymmetry, light-headedness and headaches.  Hematological: Bruises/bleeds easily.  Psychiatric/Behavioral: Negative for confusion.  All other systems reviewed and are negative.    Physical Exam Updated Vital Signs BP (!) 149/98   Pulse 72   Temp 97.8 F (36.6 C) (Oral)   Resp (!) 21   SpO2 95%    Physical Exam  Constitutional: She is oriented to person, place, and time. She appears well-developed and well-nourished. No distress.  HENT:  Head: Normocephalic and atraumatic.  Mouth/Throat: Oropharynx is clear and moist.  Eyes: Conjunctivae are normal.  Neck: Normal range of motion. Neck supple.  Cardiovascular: Normal rate, intact distal pulses and normal pulses. An irregularly irregular rhythm present.  Murmur heard. Pulmonary/Chest: Effort normal and breath sounds normal. No stridor. No respiratory distress. She has no wheezes. She has no rales.  Abdominal: Soft. Bowel sounds are normal. She exhibits no distension and no mass. There is no tenderness.  Musculoskeletal:  Bilateral lower extremity edema, right worse than left, chronic  Neurological: She is alert and oriented to person, place, and time.  Mental Status:  Alert, oriented, thought content appropriate, able to give a coherent history. Speech is slightly slurred. Able to follow 2 step commands without difficulty.  Cranial Nerves:  II:  Peripheral visual fields grossly normal, left pupil is slightly  larger than right (patient states is chronic secondary to cataract surgery), round, reactive to light III,IV, VI: ptosis not present, extra-ocular motions intact bilaterally  V,VII: smile symmetric, facial light touch sensation equal VIII: hearing grossly normal to voice  X: uvula elevates symmetrically  XI: bilateral shoulder shrug symmetric and strong XII: midline tongue extension without  fassiculations Motor:  Normal tone. 5/5 in upper and lower extremities bilaterally including strong and equal grip strength and dorsiflexion/plantar flexion Sensory: Pinprick and light touch normal in all extremities.  Deep Tendon Reflexes: 2+ and symmetric in the biceps and patella Cerebellar: normal finger-to-nose with bilateral upper extremities CV: distal pulses palpable throughout    Skin: Skin is warm. She is not diaphoretic.  Psychiatric: She has a normal mood and affect. Her behavior is normal.  Nursing note and vitals reviewed.    ED Treatments / Results  Labs (all labs ordered are listed, but only abnormal results are displayed) Labs Reviewed  BASIC METABOLIC PANEL - Abnormal; Notable for the following components:      Result Value   Glucose, Bld 177 (*)    GFR calc non Af Amer 59 (*)    All other components within normal limits  PROTIME-INR - Abnormal; Notable for the following components:   Prothrombin Time 24.4 (*)    All other components within normal limits  CBC  TROPONIN I  TROPONIN I  TROPONIN I  PROTIME-INR  I-STAT TROPONIN, ED    EKG EKG Interpretation  Date/Time:  Wednesday August 30 2018 14:18:36 EDT Ventricular Rate:  89 PR Interval:    QRS Duration: 88 QT Interval:  366 QTC Calculation: 445 R Axis:   -2 Text Interpretation:  Atrial fibrillation T wave abnormality, consider anterolateral ischemia Abnormal ECG Confirmed by Davonna Belling [phone]) on 08/30/2018 4:30:33 PM   Radiology Dg Chest 2 View  Result Date: 08/30/2018 CLINICAL DATA:  Chest pain.  Left arm tingling and left-sided jaw pain. EXAM: CHEST - 2 VIEW COMPARISON:  03/20/2018 and 01/31/2018 FINDINGS: There is chronic cardiomegaly with a prominent left pericardial fat pad. Tortuosity and calcification of the thoracic aorta. Slight chronic accentuation of the interstitial markings without infiltrates or effusions. The lungs appear hyperinflated consistent with emphysema. Diffuse osteopenia. No acute bone abnormality. IMPRESSION: 1. No acute abnormality. 2. Mild chronic cardiomegaly. 3.  Aortic Atherosclerosis (ICD10-I70.0). 4. Hyperinflated lungs suggesting emphysema. Electronically Signed   By: Lorriane Shire M.D.   On: 08/30/2018 15:56   Ct Head Wo Contrast  Result Date: 08/30/2018 CLINICAL DATA:  Headache EXAM: CT HEAD WITHOUT CONTRAST TECHNIQUE: Contiguous axial images were obtained from the base of the skull through the vertex without intravenous contrast. COMPARISON:  CT 08/26/2016, 07/09/2014 FINDINGS: Brain: No acute territorial infarction, hemorrhage or intracranial mass. Atrophy with small vessel ischemic changes of the white matter. Chronic lacunar infarct in the right basal ganglia. Stable ventricle size Vascular: No hyperdense vessels. Vertebral and carotid vascular calcification Skull: Normal. Negative for fracture or focal lesion. Sinuses/Orbits: No acute finding. Other: None. IMPRESSION: 1. No CT evidence for acute intracranial abnormality. 2. Atrophy and mild small vessel ischemic changes of the white matter Electronically Signed   By: Donavan Foil M.D.   On: 08/30/2018 17:13    Procedures Procedures (including critical care time)  Medications Ordered in ED Medications  diltiazem (CARDIZEM CD) 24 hr capsule 240 mg (has no administration in time range)  furosemide (LASIX) tablet 20 mg (has no administration in time range)  pantoprazole (PROTONIX) EC tablet 40 mg (has no administration in time range)  polyethylene glycol (MIRALAX / GLYCOLAX) packet 17 g (has no  administration in time range)  spironolactone (ALDACTONE) tablet 25 mg (has no administration in time range)  potassium chloride SA (K-DUR,KLOR-CON) CR tablet 40 mEq (has no administration in time range)  acetaminophen (TYLENOL) tablet 650 mg (has no administration in  time range)    Or  acetaminophen (TYLENOL) suppository 650 mg (has no administration in time range)  ondansetron (ZOFRAN) tablet 4 mg (has no administration in time range)    Or  ondansetron (ZOFRAN) injection 4 mg (has no administration in time range)  Warfarin - Pharmacist Dosing Inpatient (has no administration in time range)     Initial Impression / Assessment and Plan / ED Course  I have reviewed the triage vital signs and the nursing notes.  Pertinent labs & imaging results that were available during my care of the patient were reviewed by me and considered in my medical decision making (see chart for details).  Clinical Course as of Aug 30 1938  Wed Aug 30, 2018  1858 Dr. Denton Brick with [facility] accepting admission.   [JR]    Clinical Course User Index [JR] , [place] N, PA-C   Patient presenting to the ED with acute onset of left arm and chest pain that began around 1:30 in the afternoon and resolved upon ED arrival.  Patient's family member also explaining patient has had slightly slurred speech since last night which is abnormal for her.  On exam, vital signs are stable, A. fib with normal rate, normal neurologic exam though patient is with slightly slurred speech.  Labs with normal initial troponin, CBC normal, BMP unremarkable.  INR is therapeutic.  Chest x-ray normal.  EKG without ischemic changes.  CT head was ordered given patient's vague neuro symptoms which is negative.  Patient discussed with Dr. Alvino Chapel who guided treatment and agrees with care plan for admission for chest pain rule out.  Hospitalist consulted: Dr. Denton Brick accepting admission.  Patient discussed with Dr. Alvino Chapel.  The patient  appears reasonably stabilized for admission considering the current resources, flow, and capabilities available in the ED at this time, and I doubt any other [facility] requiring further screening and/or treatment in the ED prior to admission.   Final Clinical Impressions(s) / ED Diagnoses   Final diagnoses:  Left-sided chest pain    ED Discharge Orders    None       , [place] N, PA-C 08/30/18 Verner Mould, MD 08/30/18 2232

## 2018-08-30 NOTE — ED Triage Notes (Signed)
Pt presents for evaluation of L arm pain with radiation to L chest and neck. Pt reports she was eating lunch and all the sudden had severe pain.

## 2018-08-31 ENCOUNTER — Encounter (HOSPITAL_COMMUNITY): Payer: Self-pay | Admitting: Physician Assistant

## 2018-08-31 DIAGNOSIS — R072 Precordial pain: Secondary | ICD-10-CM | POA: Diagnosis not present

## 2018-08-31 DIAGNOSIS — I10 Essential (primary) hypertension: Secondary | ICD-10-CM | POA: Diagnosis not present

## 2018-08-31 DIAGNOSIS — I714 Abdominal aortic aneurysm, without rupture: Secondary | ICD-10-CM | POA: Diagnosis not present

## 2018-08-31 DIAGNOSIS — I482 Chronic atrial fibrillation: Secondary | ICD-10-CM

## 2018-08-31 DIAGNOSIS — C679 Malignant neoplasm of bladder, unspecified: Secondary | ICD-10-CM

## 2018-08-31 LAB — PROTIME-INR
INR: 2.16
PROTHROMBIN TIME: 24 s — AB (ref 11.4–15.2)

## 2018-08-31 LAB — TROPONIN I
Troponin I: <0.03 ng/mL (ref <0.03)
Troponin I: <0.03 ng/mL (ref <0.03)

## 2018-08-31 NOTE — Evaluation (Signed)
Physical Therapy Evaluation Patient Details Name: TUMEKA CHIMENTI MRN: [id number] DOB: Apr 30, 1921 Today's Date: 08/31/2018   History of Present Illness  Patient is a 84 y/o female presenting to the ED on 08/30/18 due to chest pain and L UE tingling. Two-view chest x-ray emphysema, negative for acute abnormality.  Head CT-negative for acute intracranial abnormality, atrophy and mild small vessel ischemic changes of the white matter. PMH significant for chronic atrial fibrillation on chronic anticoagulation, HTN, diastolic dysfunction, AAA, bladder cancer, Rheumatoid arthritis.     Clinical Impression  Ms. Prell is a very pleasant 84 y.o female admitted with the above listed diagnosis. Patient reports Mod I with mobility prior to admission with intermittent use of SPC. Patient today requiring general min guard to supervision level assist for mobility for safety. No LOB or overt instability with mobility. PT did lower cane to appropriate height for patient use. Will recommend HHPT to ensure safety within the home as patient reports fall 2 months prior. No further acute PT needs identified. PT to sign off.     Follow Up Recommendations Home health PT;Supervision - Intermittent    Equipment Recommendations  None recommended by PT    Recommendations for Other Services       Precautions / Restrictions Precautions Precautions: Fall Restrictions Weight Bearing Restrictions: No      Mobility  Bed Mobility Overal bed mobility: Modified Independent             General bed mobility comments: increased times  Transfers Overall transfer level: Needs assistance Equipment used: Straight cane Transfers: Sit to/from Stand Sit to Stand: Min guard         General transfer comment: for safety and immediate standing balance  Ambulation/Gait Ambulation/Gait assistance: Min guard;Supervision Gait Distance (Feet): 200 Feet Assistive device: Straight cane Gait Pattern/deviations:  Step-through pattern;Decreased stride length;Drifts right/left;Trunk flexed Gait velocity: WFL   General Gait Details: lowered SPC to appropriate height for patient - improved use   Stairs            Wheelchair Mobility    Modified Rankin (Stroke Patients Only)       Balance Overall balance assessment: Mild deficits observed, not formally tested                                           Pertinent Vitals/Pain Pain Assessment: Faces Faces Pain Scale: Hurts little more Pain Location: knee Pain Descriptors / Indicators: Discomfort;Sore Pain Intervention(s): Limited activity within patient's tolerance;Monitored during session    [place] expects to be discharged to:: Private residence Living Arrangements: Children Available Help at Discharge: Family;Available PRN/intermittently(daughter) Type of Home: Mobile home Home Access: Ramped entrance     Home Layout: One level Home Equipment: Cane - single point;Grab bars - tub/shower;Shower seat;Bedside commode      Prior Function Level of Independence: Independent with assistive device(s)         Comments: reports intermittent use of SPC - often "forgets" it     Hand Dominance        Extremity/Trunk Assessment   Upper Extremity Assessment Upper Extremity Assessment: Overall WFL for tasks assessed    Lower Extremity Assessment Lower Extremity Assessment: Overall WFL for tasks assessed    Cervical / Trunk Assessment Cervical / Trunk Assessment: Normal  Communication   Communication: No difficulties  Cognition Arousal/Alertness: Awake/alert Behavior During Therapy: WFL for tasks assessed/performed  Overall Cognitive Status: Within Functional Limits for tasks assessed                                        General Comments      Exercises     Assessment/Plan    PT Assessment Patent does not need any further PT services  PT Problem List          PT Treatment Interventions      PT Goals (Current goals can be found in the Care Plan section)  Acute Rehab PT Goals Patient Stated Goal: return home PT Goal Formulation: With patient Time For Goal Achievement: 09/02/18 Potential to Achieve Goals: Good    Frequency     Barriers to discharge        Co-evaluation               AM-PAC PT "6 Clicks" Daily Activity  Outcome Measure Difficulty turning over in bed (including adjusting bedclothes, sheets and blankets)?: A Little Difficulty moving from lying on back to sitting on the side of the bed? : A Little Difficulty sitting down on and standing up from a chair with arms (e.g., wheelchair, bedside commode, etc,.)?: Unable Help needed moving to and from a bed to chair (including a wheelchair)?: A Little Help needed walking in hospital room?: A Little Help needed climbing 3-5 steps with a railing? : A Little 6 Click Score: 16    End of Session Equipment Utilized During Treatment: Gait belt Activity Tolerance: Patient tolerated treatment well Patient left: in bed;with call bell/phone within reach Nurse Communication: Mobility status PT Visit Diagnosis: Unsteadiness on feet (R26.81);Muscle weakness (generalized) (M62.81)    Time: 7711-6579 PT Time Calculation (min) (ACUTE ONLY): 22 min   Charges:   PT Evaluation $PT Eval Moderate Complexity: 1 Mod        Lanney Gins, PT, DPT Supplemental Physical Therapist 08/31/18 9:43 AM Pager: [phone] Office: [phone]

## 2018-08-31 NOTE — Care Management Note (Signed)
Case Management Note Marvetta Gibbons RN, BSN Unit 4E- RN Care Coordinator -- [facility] coverage [phone]  Patient Details  Name: [name] MRN: [id number] Date of Birth: 11/29/1921  Subjective/Objective:   Pt presented with Chest pain                 Action/Plan: PTA pt lived at home alone, Per PT eval recommendation for HHPT- order has been placed- spoke with pt at bedside- choice offered for [facility] services- per pt she has used [facility] agency in the past- per epic this was [facility]- which pt confirms is who she would like to use. Pt reports that she has all needed DME available for use at home. (cane, walker, bsc,etc)- referral called to Butch Penny with [facility] for HHPT- referral accepted.  Pt address and phone # confirmed in epic.   Expected Discharge Date:  08/31/18               Expected Discharge Plan:  [place]  In-House Referral:  NA  Discharge planning Services  CM Consult  Post Acute Care Choice:  Home Health Choice offered to:  Patient  DME Arranged:    DME Agency:     HH Arranged:  PT [place]:  [place]  Status of Service:  Completed, signed off  If discussed at [place] of Stay Meetings, dates discussed:    Discharge Disposition: home/home health   Additional Comments:  Dawayne Patricia, RN 08/31/2018, 10:34 AM

## 2018-08-31 NOTE — Progress Notes (Signed)
F/u appt made 10/22 at 9:20am with Dr. Ellyn Hack. Dayna Dunn PA-C

## 2018-08-31 NOTE — Consult Note (Addendum)
Cardiology Consultation:   Patient ID: [name]; [id number]; 28-Oct-1921   Admit date: 08/30/2018 Date of Consult: 08/31/2018  Primary Care Provider: [name], MD Primary Cardiologist: Glenetta Hew, MD Primary Electrophysiologist:  None  Chief Complaint: chest pain, slurred speech  Patient Profile:   [name] is a 84 y.o. female with a hx of permanent atrial fibrillation, chronic diastolic CHF, AAA, bladder CA, anxiety, chronic back pain, concussion with subarachnoid hemorrhage 2005, nonobstructive CAD, remote DVT 2005 after fall, fatty liver, GERD, hiatal hernia, HLD, carotid artery disease, chronic LEE who is being seen today for the evaluation of chest pain at the request of Dr. Denton Brick (chest pain unit consult).  History of Present Illness:   Regarding cardiac history, nonobstructive CAD was added to her PMH but I cannot find prior report. Dr.Harding's note mentions a cath in the 1980s that showed no significant disease. Consult note from 2013 outlines prior 40% R Carotid and 70% L Carotid; no f/u dopplers available in chart. Abd [place] 08/2017 showed stable appearing AAA measuring 3.4cm, repeat due 08/2019. She has prior history of going RVR in the setting of acute illness, but also has been bradycardic as well. In 01/2018 when admitted for PNA, Cardizem dose was increased and BB was added - however, HR dropped into 30s and 50s and the beta-blocker was discontinued in the way of her home diltiazem dose. She is on warfarin which is followed by PCP - NOAC has been broached with her before and she's been encouraged to discuss with primary care. Last echo 03/2018 showed EF 55-60%, mild LAE, severe RAE, +aortic sclerosis without stenosis.  [name] seems to confabulate a bit during the interview and there is no family at bedside. Per H/P, "Patient reports she was eating a [employer] today when suddenly she started having right arm numbness tingling, elbow pain that  radiated to her neck and left chest causing chest pain.  Describes the pain as sharp.  Denies associated shortness of breath diaphoresis nausea.  She also reports an associated headache at the top of her head. Daughter reports intermittent mild slurred speech since yesterday, patient falling to her right side, also over the past 24 hours but this was worse today." The patient told me in different parts of the conversation that the chest pain began either at her senior center at [place] while "eating lunch" or on the way to [place] with her daughter. At one point she said it began in the car on the way to the hospital. She did not specifically remember eating at [facility]. She is able to tell me that at some point she had chest pain in the left chest with some discomfort in her left arm and left neck. She thinks it lasted about an hour and resolved spontaneously "when they began taking care of me and running tests." She did received Tramadol yesterday. She denies any aggravating factors. She reports having episodic chest pain but has a hard time recalling how often. Labs show therapeutic INR of 2.21, normal CBC, troponins neg x 4. CXR without acute abnormality. CT head nonacute. MRI of the brain showed advanced chronic ischemic microangiopathy without acute intracranial abnormality. VSS, mildly hypertensive at times. She currently feels much better this morning.   Past Medical History:  Diagnosis Date  . AAA (abdominal aortic aneurysm) ([place])   . Anxiety   . Bladder cancer ([place]) dx'd 2011   Chronic microscopic hematuria; transitional cell cancer  . Blood transfusion 1960   "  related to hysterectomy"  . Chronic atrial fibrillation (HCC)    Anticoagulated with warfarin, rate control with diltiazem and Toprol  . Chronic back pain   . Chronic headache    "probably 2/wk" (08/28/2015)  . Concussion w/o coma 6/80/3212   Complicated by subarachnoid hemorrhage.  "even now has times when she's not able to  comprehend" (05/08/12)  . Coronary artery disease    Nonobstructive  . Diverticulitis   . Diverticulosis   . DVT (deep venous thrombosis), right 2005   "right calf after 8 foot fall"  . Edema of both legs    Chronic, thought to be secondary to DVTs  . External hemorrhoids   . Family history of adverse reaction to anesthesia 2012   daughter "had OR for crushed hand; had problems w/anesthesia & I was in there all day long" (  . Fatty liver   . Frequent UTI   . GERD (gastroesophageal reflux disease)   . H/O hiatal hernia   . High cholesterol   . Migraine    "rare now" (08/28/2015)  . Pneumonia ~ 2010;  2013; 07/2015  . Rheumatoid arthritis(714.0)    "hands" (08/28/2015)  . Urinary frequency     Past Surgical History:  Procedure Laterality Date  . BREAST CYST EXCISION Left 1960's?   2 cysts; benign  . CARDIAC CATHETERIZATION  1980's  . CATARACT EXTRACTION W/ INTRAOCULAR LENS  IMPLANT, BILATERAL Bilateral 1992  . CYSTOSCOPY  11/11/2011   Procedure: CYSTOSCOPY;  Surgeon: Malka So;  Location: [facility];  Service: Urology;  Laterality: N/A;  . CYSTOSTOMY W/ BLADDER BIOPSY  10/08/2013  . INCONTINENCE SURGERY  1980's  . JOINT REPLACEMENT    . KNEE ARTHROSCOPY Right 2005   S/P fall  . Lower Extremity Venous Doppler  11/10/2013   No DVT or superficial thrombus enlarged inguinal lymph node noted in the right. No Baker's cyst.  . TONSILLECTOMY  1952  . TOTAL KNEE ARTHROPLASTY Right 02/05/2013   Procedure: TOTAL KNEE ARTHROPLASTY;  Surgeon: Mauri Pole, MD;  Location: [facility];  Service: Orthopedics;  Laterality: Right;  . TRANSTHORACIC ECHOCARDIOGRAM  12/2013   EF 60-65%. NO RWMA. Ao Sclerosis. MAC - no MS with mild MR>  B Atriae mildly dilated.  . TRANSURETHRAL RESECTION OF BLADDER TUMOR  11/11/2011   Procedure: TRANSURETHRAL RESECTION OF BLADDER TUMOR (TURBT);  Surgeon: Malka So;  Location: [facility];  Service: Urology;  Laterality: N/A;  Cysto, Bladder Biopsy, TURBT with Gyrus,   .  TRANSURETHRAL RESECTION OF BLADDER TUMOR WITH GYRUS (TURBT-GYRUS)  2007; 2009; 2010   "for tumors on surface of bladder"  . TUMOR EXCISION  1976; 1980's   "fatty tumor cut off her upper back; left thumb"  . VAGINAL HYSTERECTOMY  1960   partial      Inpatient Medications: Scheduled Meds: . diltiazem  240 mg Oral Daily  . furosemide  20 mg Oral Daily  . pantoprazole  40 mg Oral Daily  . spironolactone  25 mg Oral [id number]  . Warfarin - Pharmacist Dosing Inpatient   Does not apply q1800   Continuous Infusions:  PRN Meds: acetaminophen **OR** acetaminophen, ondansetron **OR** ondansetron (ZOFRAN) IV, polyethylene glycol  Home Meds: Prior to Admission medications   Medication Sig Start Date End Date Taking? Authorizing Provider  bifidobacterium infantis (ALIGN) capsule Take 1 capsule by mouth daily.   Yes [provider]  calcium carbonate (OS-CAL - DOSED IN MG OF ELEMENTAL CALCIUM) 1250 MG tablet Take 1 tablet by mouth  every morning.    Yes [provider]  cholecalciferol (VITAMIN D) 1000 UNITS tablet Take 1,000 Units by mouth daily.   Yes [provider]  Cinnamon 500 MG capsule Take 1,000 mg by mouth daily.   Yes [provider]  diltiazem (CARDIZEM CD) 240 MG 24 hr capsule Take 1 capsule (240 mg total) by mouth daily. 09/02/15  Yes [name], MD  furosemide (LASIX) 20 MG tablet Take 1 tablet (20 mg total) by mouth daily. Patient taking differently: Take 20-40 mg by mouth daily. Take one tablet (20MG) by mouth daily except on Mon, Wed, Friday take 2 tablets (40 MG) 09/02/15  Yes [name], MD  Multiple Vitamins-Minerals (MULTIVITAMINS THER. W/MINERALS) TABS Take 1 tablet by mouth daily.    Yes [provider]  Omega-3 Fatty Acids (FISH OIL) 1000 MG CAPS Take 1,000 mg by mouth daily.   Yes [provider]  omeprazole (PRILOSEC) 20 MG capsule Take 20 mg by mouth 2 (two) times daily.   Yes [provider]  polyethylene glycol  (MIRALAX / GLYCOLAX) packet Take 17 g by mouth daily as needed for moderate constipation. 09/02/15  Yes [name], MD  psyllium (METAMUCIL) 58.6 % powder Take 1 packet by mouth 2 (two) times daily.    Yes [provider]  spironolactone (ALDACTONE) 25 MG tablet Take 25 mg by mouth every morning.   Yes [provider]  warfarin (COUMADIN) 5 MG tablet Take 2.5-5 mg by mouth See admin instructions. Takes 1 tablet (5 mg) daily EXCEPT for 0.5 tablet (2.5 mg) on Tuesdays and Saturdays 05/23/14  Yes [provider]    Allergies:    Allergies  Allergen Reactions  . Contrast Media [Iodinated Diagnostic Agents] Hives  . Amoxicillin Itching  . Ampicillin Itching  . Levaquin [Levofloxacin] Itching and Rash  . Penicillins Rash    "haven't had it in years"  . Vancomycin Itching and Rash    Social History:   Social History   Socioeconomic History  . Marital status: Widowed    Spouse name: Not on file  . Number of children: 3  . Years of education: Not on file  . Highest education level: 7th grade  Occupational History  . Occupation: Retired  Scientific laboratory technician  . Financial resource strain: Not hard at all  . Food insecurity:    Worry: Never true    Inability: Never true  . Transportation needs:    Medical: No    Non-medical: No  Tobacco Use  . Smoking status: Former Smoker    Packs/day: 1.00    Years: 40.00    Pack years: 40.00    Types: Cigarettes    Last attempt to quit: 11/07/1982    Years since quitting: 35.8  . Smokeless tobacco: Former Systems developer    Types: Snuff  Substance and Sexual Activity  . Alcohol use: Yes    Alcohol/week: 7.0 standard drinks    Types: 7 Glasses of wine per week    Comment: 1 glass of wine prior to bed  . Drug use: No  . Sexual activity: Never  Lifestyle  . Physical activity:    Days per week: 7 days    Minutes per session: 30 min  . Stress: Only a little  Relationships  . Social connections:    Talks on phone: More than three  times a week    Gets together: More than three times a week    Attends religious service: More than 4 times  per year    Active member of club or organization: No    Attends meetings of clubs or organizations: Never    Relationship status: Widowed  . Intimate partner violence:    Fear of current or ex partner: No    Emotionally abused: No    Physically abused: No    Forced sexual activity: No  Other Topics Concern  . Not on file  Social History Narrative   She is a widowed mother of 16, grandmother of 80, great grandmother of 59. She does not   really get routine exercise. She quit smoking in 1983. She does not smoke and does not use illicit drugs and does not drink.    Family History:   The patient's family history includes Anesthesia problems in her daughter; Breast cancer in her other; Heart disease in her brother; Prostate cancer in her brother. There is no history of Colon cancer.  ROS:  Please see the history of present illness.  All other ROS reviewed and negative.     Physical Exam/Data:   Vitals:   08/30/18 1915 08/30/18 1947 08/30/18 2338 08/31/18 0619  BP: (!) 149/98 (!) 157/70 138/81 126/72  Pulse: 72 89 97 97  Resp: (!) 21 18 16 16   Temp:  97.9 F (36.6 C) 97.8 F (36.6 C) 98.1 F (36.7 C)  TempSrc:  Oral Oral Oral  SpO2: 95% 95% 95% 96%  Weight:  72.1 kg    Height:  5' 3.5" (1.613 m)     No intake or output data in the 24 hours ending 08/31/18 0805 Filed Weights   08/30/18 1947  Weight: 72.1 kg   Body mass index is 27.72 kg/m.  General: Well developed, well nourished WF in no acute distress. Head: Normocephalic, atraumatic, sclera non-icteric, no xanthomas, nares are without discharge.  Neck: Negative for carotid bruits. JVD not elevated. Lungs: Clear bilaterally to auscultation without wheezes, rales, or rhonchi. Breathing is unlabored. Heart: Irregularly irregular, rate borderline elevated, with S1 S2. No murmurs, rubs, or gallops appreciated. Abdomen:  Soft, non-tender, non-distended with normoactive bowel sounds. No hepatomegaly. No rebound/guarding. No obvious abdominal masses. Msk:  Strength and tone appear normal for age. Extremities: No clubbing or cyanosis. Trace BLE edema.  Distal pedal pulses are 2+ and equal bilaterally. Neuro: Alert and oriented to 2019, self. Initially thought she was in [place] but then after prompting was able to tell me she is in the ER. No facial asymmetry. No focal deficit. Moves all extremities spontaneously. Psych:  Responds to questions appropriately with a normal affect.  EKG:  The EKG was personally reviewed and demonstrates atrial fib 89bpm with TWI III, avF, V4-V6 similar to prior. HR 60s-80s, but was in the 120s while ambulating to bathroom.  Relevant CV Studies: Referenced above  Laboratory Data:  Chemistry Recent Labs  Lab 08/30/18 1440  NA 136  K 3.5  CL 100  CO2 24  GLUCOSE 177*  BUN 8  CREATININE 0.83  CALCIUM 9.3  GFRNONAA 59*  GFRAA >60  ANIONGAP 12    No results for input(s): PROT, ALBUMIN, AST, ALT, ALKPHOS, BILITOT in the last 168 hours. Hematology Recent Labs  Lab 08/30/18 1440  WBC 6.6  RBC 4.14  HGB 13.3  HCT 40.5  MCV 97.8  MCH 32.1  MCHC 32.8  RDW 12.5  PLT 238   Cardiac Enzymes Recent Labs  Lab 08/30/18 1952 08/31/18 0030 08/31/18 0635  TROPONINI <0.03 <0.03 <0.03    Recent Labs  Lab 08/30/18  1459  TROPIPOC 0.01    BNPNo results for input(s): BNP, PROBNP in the last 168 hours.  DDimer No results for input(s): DDIMER in the last 168 hours.  Radiology/Studies:  Dg Chest 2 View  Result Date: 08/30/2018 CLINICAL DATA:  Chest pain. Left arm tingling and left-sided jaw pain. EXAM: CHEST - 2 VIEW COMPARISON:  03/20/2018 and 01/31/2018 FINDINGS: There is chronic cardiomegaly with a prominent left pericardial fat pad. Tortuosity and calcification of the thoracic aorta. Slight chronic accentuation of the interstitial markings without infiltrates or  effusions. The lungs appear hyperinflated consistent with emphysema. Diffuse osteopenia. No acute bone abnormality. IMPRESSION: 1. No acute abnormality. 2. Mild chronic cardiomegaly. 3.  Aortic Atherosclerosis (ICD10-I70.0). 4. Hyperinflated lungs suggesting emphysema. Electronically Signed   By: Lorriane Shire M.D.   On: 08/30/2018 15:56   Ct Head Wo Contrast  Result Date: 08/30/2018 CLINICAL DATA:  Headache EXAM: CT HEAD WITHOUT CONTRAST TECHNIQUE: Contiguous axial images were obtained from the base of the skull through the vertex without intravenous contrast. COMPARISON:  CT 08/26/2016, 07/09/2014 FINDINGS: Brain: No acute territorial infarction, hemorrhage or intracranial mass. Atrophy with small vessel ischemic changes of the white matter. Chronic lacunar infarct in the right basal ganglia. Stable ventricle size Vascular: No hyperdense vessels. Vertebral and carotid vascular calcification Skull: Normal. Negative for fracture or focal lesion. Sinuses/Orbits: No acute finding. Other: None. IMPRESSION: 1. No CT evidence for acute intracranial abnormality. 2. Atrophy and mild small vessel ischemic changes of the white matter Electronically Signed   By: Donavan Foil M.D.   On: 08/30/2018 17:13   Mr Brain Wo Contrast  Result Date: 08/30/2018 CLINICAL DATA:  For the slurred speech and left arm numbness. EXAM: MRI HEAD WITHOUT CONTRAST TECHNIQUE: Multiplanar, multiecho pulse sequences of the brain and surrounding structures were obtained without intravenous contrast. COMPARISON:  Head CT 08/30/2018 FINDINGS: BRAIN: There is no acute infarct, acute hemorrhage or mass effect. The midline structures are normal. There are no old infarcts. Early confluent hyperintense T2-weighted signal of the periventricular and deep white matter, most commonly due to chronic ischemic microangiopathy. Generalized atrophy without lobar predilection. Susceptibility-sensitive sequences show no chronic microhemorrhage or superficial  siderosis. VASCULAR: Major intracranial arterial and venous sinus flow voids are preserved. SKULL AND UPPER CERVICAL SPINE: The visualized skull base, calvarium, upper cervical spine and extracranial soft tissues are normal. SINUSES/ORBITS: No fluid levels or advanced mucosal thickening. No mastoid or middle ear effusion. The orbits are normal. IMPRESSION: Advanced chronic ischemic microangiopathy without acute intracranial abnormality. Electronically Signed   By: Ulyses Jarred M.D.   On: 08/30/2018 21:10    Assessment and Plan:   1. Chest pain/left arm pain - some features concerning for angina, but cardiac workup is reassuring. EKG appears chronically abnormal and other workup is unrevealing. I will review definitive plans with Dr. Sallyanne Kuster. Given advanced age, h/o SAH, and falling, it is less ideal to pursue invasive testing in the absence of objective evidence of ischemia.  2. Neurologic changes including reported mild slurred speech, falling - brain imaging unremarkable. Per IM.  3. Chronic atrial fib - rates are generally acceptable, continued on Coumadin with therapeutic INR.  4. AAA - repeat duplex due 08/2019.  For questions or updates, please contact [place] Please consult [email or web address] for contact info under Cardiology/STEMI.    Signed, Charlie Pitter, PA-C  08/31/2018 8:05 AM  I have seen and examined the patient along with Melina Copa, PA.  I have reviewed the chart, notes and new  data.  I agree with PA's note.  Key new complaints: She has not had any further chest/arm discomfort since initial presentation.  He is walking briskly in the hall with physical therapy right now. Key examination changes: Irregular rhythm, but otherwise normal cardiovascular exam Key new findings / data: Negative cardiac troponin, ECG with atrial fibrillation and chronic repolarization changes, no acute abnormalities.  The notes absence of acute pathology on brain MRI, although there are chronic  ischemic changes present.  PLAN: While it is possible that her arm and shoulder discomfort represented angina, the episode was brief and heart disease is unlikely to explain the full constellation of symptoms which included headache and unsteady gait. Its likely that she has underlying coronary atherosclerosis, but there is no indication for acute coronary syndrome.  Aggressive work-up should be undertaken with caution in this 84 year old woman with chronic anticoagulation and a history of serious bleeding complications.  Would pursue a conservative approach and her daughter Neoma Laming agrees. No additional cardiology work-up planned at this point, will make arrangements for follow-up.  Consider Lexiscan Myoview as an outpatient if she has more episodes of chest discomfort.  Sanda Klein, MD, [place] [phone] 08/31/2018, 8:45 AM

## 2018-08-31 NOTE — Progress Notes (Signed)
Daughter [phone].  Called to follow up on her mom.

## 2018-08-31 NOTE — Discharge Summary (Signed)
Physician Discharge Summary  Patient ID: [name] MRN: [id number] DOB/AGE: 82/21/1922 82 y.o.  Admit date: 08/30/2018 Discharge date: 08/31/2018  Admission Diagnoses:  Discharge Diagnoses:  Principal Problem:   Chest pain Active Problems:   Hypertension   Bladder cancer [facility])   Chronic atrial fibrillation ([place])   AAA (abdominal aortic aneurysm) [facility])   Discharged Condition: stable  Hospital Course: The patient is a 84 year old female with past medical history significant for permanent atrial fibrillation and chronic anticoagulation, hypertension, diastolic dysfunction, abdominal aortic aneurysm, bladder cancer, rheumatoid arthritis.  Patient was admitted with atypical chest pain.  On presentation, there were concerns for intermittent slurry speech and tendency of the patient to fall to one side.  Work-up done so far has been negative.  Cardiac enzymes came back negative.  Cardiology team was consulted.  Cardiology has cleared patient for discharge.  Patient will follow with cardiology team on discharge.  MRI of the brain was negative for acute infarct.  Physical therapy was consulted, and patient will be discharged back home with physical therapy.  Patient has been instructed to contact her primary care provider or come to the hospital if slurry speech reoccurs or if she continues to fall to one side.  Consults: cardiology  Significant Diagnostic Studies: radiology: MRI brain: "Advanced chronic ischemic microangiopathy without acute intracranial"  Cardiac enzymes came back negative.  INR prior to discharge was 2.16. abnormality.  Discharge Exam: Blood pressure 126/72, pulse 97, temperature 98.1 F (36.7 C), temperature source Oral, resp. rate 16, height 5' 3.5" (1.613 m), weight 72.1 kg, SpO2 96 %.   Disposition: Discharge disposition: 06-Home-Health Care Svc   Discharge Instructions    Diet - low sodium heart healthy   Complete by:  As directed    Discharge  instructions   Complete by:  As directed    Please call your MD if slurred speech reoccurs, or, if he continued to fall to one side.   Increase activity slowly   Complete by:  As directed      Allergies as of 08/31/2018      Reactions   Contrast Media [iodinated Diagnostic Agents] Hives   Amoxicillin Itching   Ampicillin Itching   Levaquin [levofloxacin] Itching, Rash   Penicillins Rash   "haven't had it in years"   Vancomycin Itching, Rash      Medication List    STOP taking these medications   bifidobacterium infantis capsule   Cinnamon 500 MG capsule     TAKE these medications   calcium carbonate 1250 (500 Ca) MG tablet Commonly known as:  OS-CAL - dosed in mg of elemental calcium Take 1 tablet by mouth every morning.   cholecalciferol 1000 units tablet Commonly known as:  VITAMIN D Take 1,000 Units by mouth daily.   diltiazem 240 MG 24 hr capsule Commonly known as:  CARDIZEM CD Take 1 capsule (240 mg total) by mouth daily.   Fish Oil 1000 MG Caps Take 1,000 mg by mouth daily.   furosemide 20 MG tablet Commonly known as:  LASIX Take 1 tablet (20 mg total) by mouth daily. What changed:    how much to take  additional instructions   multivitamins ther. w/minerals Tabs tablet Take 1 tablet by mouth daily.   polyethylene glycol packet Commonly known as:  MIRALAX / GLYCOLAX Take 17 g by mouth daily as needed for moderate constipation.   PRILOSEC 20 MG capsule Generic drug:  omeprazole Take 20 mg by mouth 2 (two) times daily.  psyllium 58.6 % powder Commonly known as:  METAMUCIL Take 1 packet by mouth 2 (two) times daily.   spironolactone 25 MG tablet Commonly known as:  ALDACTONE Take 25 mg by mouth every morning.   warfarin 5 MG tablet Commonly known as:  COUMADIN Take 2.5-5 mg by mouth See admin instructions. Takes 1 tablet (5 mg) daily EXCEPT for 0.5 tablet (2.5 mg) on Tuesdays and Saturdays      Follow-up Information    Leonie Man,  MD Follow up.   Specialty:  Cardiology Why:  See follow-up appointment below. Contact information: [address] [place] [address] [phone]           Signed: Bonnell Public 08/31/2018, 10:20 AM

## 2018-08-31 NOTE — Progress Notes (Signed)
Reserve for warfarin  Indication: atrial fibrillation  Allergies  Allergen Reactions  . Contrast Media [Iodinated Diagnostic Agents] Hives  . Amoxicillin Itching  . Ampicillin Itching  . Levaquin [Levofloxacin] Itching and Rash  . Penicillins Rash    "haven't had it in years"  . Vancomycin Itching and Rash    Patient Measurements: Height: 5' 3.5" (161.3 cm) Weight: 159 lb (Adriana.1 kg) IBW/kg (Calculated) : 53.55 Heparin Dosing Weight:   Vital Signs: Temp: 98.1 Chambers (36.7 C) (09/19 0619) Temp Source: Oral (09/19 0619) BP: 126/Adriana (09/19 0619) Pulse Rate: 97 (09/19 0619)  Labs: Recent Labs    08/30/18 1440 08/30/18 1715 08/30/18 1952 08/31/18 0030 08/31/18 0635  HGB 13.3  --   --   --   --   HCT 40.5  --   --   --   --   PLT 238  --   --   --   --   LABPROT  --  24.4*  --   --  24.0*  INR  --  2.21  --   --  2.16  CREATININE 0.83  --   --   --   --   TROPONINI  --   --  <0.03 <0.03 <0.03    Estimated Creatinine Clearance: 39.9 mL/min (by C-G formula based on SCr of 0.83 mg/dL).   Medical History: Past Medical History:  Diagnosis Date  . AAA (abdominal aortic aneurysm) ([place])   . Anxiety   . Bladder cancer ([place]) dx'd 2011   Chronic microscopic hematuria; transitional cell cancer  . Blood transfusion 1960   "related to hysterectomy"  . Chronic atrial fibrillation (HCC)    Anticoagulated with warfarin, rate control with diltiazem and Toprol  . Chronic back pain   . Chronic headache    "probably 2/wk" (08/28/2015)  . Concussion w/o coma 01/16/5596   Complicated by subarachnoid hemorrhage.  "even now has times when she's not able to comprehend" (05/08/12)  . Coronary artery disease    Nonobstructive  . Diverticulitis   . Diverticulosis   . DVT (deep venous thrombosis), right 2005   "right calf after 8 foot fall"  . Edema of both legs    Chronic, thought to be secondary to DVTs  . External hemorrhoids   . Family history of  adverse reaction to anesthesia 2012   daughter "had OR for crushed hand; had problems w/anesthesia & I was in there all day long" (  . Fatty liver   . Frequent UTI   . GERD (gastroesophageal reflux disease)   . H/O hiatal hernia   . High cholesterol   . Migraine    "rare now" (08/28/2015)  . Pneumonia ~ 2010;  2013; 07/2015  . Rheumatoid arthritis(714.0)    "hands" (08/28/2015)  . Urinary frequency     Assessment: 84 yo Chambers with on chronic anticoagulation for Afib with warfarin admitted for chest pain. Pharmacy consulted to manage warfarin dosing.  INR 2.16, CBC WNL  PTA warfarin regimen: 5mg  daily, except 2.5 mg on Tues and Sat  Goal of Therapy:  INR 2-3 Monitor platelets by anticoagulation protocol: Yes   Plan:  Warfarin 5mg  x1  Daily INR, monitor for s/s of bleeding   A. Levada Dy, PharmD, [place] Pager: [phone] Please utilize Amion for appropriate phone number to reach the unit pharmacist ([place])   08/31/2018    8:57 AM

## 2018-09-11 DIAGNOSIS — M069 Rheumatoid arthritis, unspecified: Secondary | ICD-10-CM | POA: Diagnosis not present

## 2018-09-11 DIAGNOSIS — I5030 Unspecified diastolic (congestive) heart failure: Secondary | ICD-10-CM | POA: Diagnosis not present

## 2018-09-11 DIAGNOSIS — Z96651 Presence of right artificial knee joint: Secondary | ICD-10-CM | POA: Diagnosis not present

## 2018-09-11 DIAGNOSIS — I714 Abdominal aortic aneurysm, without rupture: Secondary | ICD-10-CM | POA: Diagnosis not present

## 2018-09-11 DIAGNOSIS — Z7901 Long term (current) use of anticoagulants: Secondary | ICD-10-CM | POA: Diagnosis not present

## 2018-09-11 DIAGNOSIS — Z86718 Personal history of other venous thrombosis and embolism: Secondary | ICD-10-CM | POA: Diagnosis not present

## 2018-09-11 DIAGNOSIS — I2510 Atherosclerotic heart disease of native coronary artery without angina pectoris: Secondary | ICD-10-CM | POA: Diagnosis not present

## 2018-09-11 DIAGNOSIS — I110 Hypertensive heart disease with heart failure: Secondary | ICD-10-CM | POA: Diagnosis not present

## 2018-09-11 DIAGNOSIS — I4821 Permanent atrial fibrillation: Secondary | ICD-10-CM | POA: Diagnosis not present

## 2018-09-11 DIAGNOSIS — Z8551 Personal history of malignant neoplasm of bladder: Secondary | ICD-10-CM | POA: Diagnosis not present

## 2018-09-11 DIAGNOSIS — Z87440 Personal history of urinary (tract) infections: Secondary | ICD-10-CM | POA: Diagnosis not present

## 2018-09-14 DIAGNOSIS — Z23 Encounter for immunization: Secondary | ICD-10-CM | POA: Diagnosis not present

## 2018-09-14 DIAGNOSIS — I4891 Unspecified atrial fibrillation: Secondary | ICD-10-CM | POA: Diagnosis not present

## 2018-09-14 DIAGNOSIS — Z7901 Long term (current) use of anticoagulants: Secondary | ICD-10-CM | POA: Diagnosis not present

## 2018-10-03 ENCOUNTER — Ambulatory Visit: Payer: PPO | Admitting: Cardiology

## 2018-10-03 ENCOUNTER — Encounter: Payer: Self-pay | Admitting: Cardiology

## 2018-10-03 VITALS — BP 136/63 | HR 88 | Ht 65.0 in | Wt 162.0 lb

## 2018-10-03 DIAGNOSIS — R600 Localized edema: Secondary | ICD-10-CM

## 2018-10-03 DIAGNOSIS — I519 Heart disease, unspecified: Secondary | ICD-10-CM | POA: Diagnosis not present

## 2018-10-03 DIAGNOSIS — I714 Abdominal aortic aneurysm, without rupture, unspecified: Secondary | ICD-10-CM

## 2018-10-03 DIAGNOSIS — Z7901 Long term (current) use of anticoagulants: Secondary | ICD-10-CM | POA: Diagnosis not present

## 2018-10-03 DIAGNOSIS — I700 Atherosclerosis of aorta: Secondary | ICD-10-CM | POA: Diagnosis not present

## 2018-10-03 DIAGNOSIS — R072 Precordial pain: Secondary | ICD-10-CM

## 2018-10-03 DIAGNOSIS — I482 Chronic atrial fibrillation, unspecified: Secondary | ICD-10-CM | POA: Diagnosis not present

## 2018-10-03 DIAGNOSIS — I10 Essential (primary) hypertension: Secondary | ICD-10-CM | POA: Diagnosis not present

## 2018-10-03 DIAGNOSIS — I358 Other nonrheumatic aortic valve disorders: Secondary | ICD-10-CM

## 2018-10-03 DIAGNOSIS — I5189 Other ill-defined heart diseases: Secondary | ICD-10-CM

## 2018-10-03 NOTE — Patient Instructions (Signed)
Medication Instructions:  NOT NEEDED If you need a refill on your cardiac medications before your next appointment, please call your pharmacy.   Lab work: NOT  NEEDED If you have labs (blood work) drawn today and your tests are completely normal, you will receive your results only by: . MyChart Message (if you have MyChart) OR . A paper copy in the mail If you have any lab test that is abnormal or we need to change your treatment, we will call you to review the results.  Testing/Procedures: NOT NEEDED  Follow-Up: At [facility], you and your health needs are our priority.  As part of our continuing mission to provide you with exceptional heart care, we have created designated Provider Care Teams.  These Care Teams include your primary Cardiologist (physician) and Advanced Practice Providers (APPs -  Physician Assistants and Nurse Practitioners) who all work together to provide you with the care you need, when you need it. You will need a follow up appointment in 6 months.  Please call our office 2 months in advance to schedule this appointment.  You may see David Harding, MD or one of the following Advanced Practice Providers on your designated Care Team:   Rhonda Barrett, PA-C . Kathryn Lawrence, DNP, ANP  Any Other Special Instructions Will Be Listed Below (If Applicable). NOT NEEDED   

## 2018-10-03 NOTE — Progress Notes (Signed)
Patient  PCP: [name], MD  Clinic Note: Chief Complaint  Patient presents with  . Hospitalization Follow-up    CP / L Arm pain - r/o MI & CVA  . Atrial Fibrillation    no Sx.    HPI: [name] is a 84 y.o. female with a PMH notable for C Afib with ~ HFPF.  who presents today for Hospital f/u - ER visit for CP.  - On warfarin for [facility] & diltiazem for rte control Also h/o HTN, AAA, RA & h/o bladder CA.  Long ago she had an ischemic evaluation that was negative for coronary disease. Apparently she had heart catheterization back in the 1980s which showed no significant disease. She's not had any anginal symptoms since. She may have some mild diastolic heart failure symptoms related to her chronic AFib on occasion. I saw her back in 2015 - Afib following Knee Sgx 07/2013.    [name] was last seen on Feb 27,2019 --> she felt much better after hospital stay your heart rate was much better controlled.  No chest pain or pressure.  Recent Hospitalizations:   ER 08/30/18 - CP (she says that her L arm started hurting --> neck) r/o MI (Cards c/s felt that it was non-cardiac); also ? TIA Sx. Negative Brain MRI.  R/o MI.  OP PT.  Studies Personally Reviewed - (if available, images/films reviewed: From Epic Chart or Care Everywhere)  Echo 03/2018: EF 55-60%. AoV Sclerosis (no stenosis).  Trivial AI & MR. Mild LAE & Severe RAE.. Mild RV dilation  (August 25, 2017 abdominal ultrasound: Stable abdominal aortic aneurysm measuring 3.4 cm in diameter.  Otherwise atherosclerotic change noted. ->  Recommend follow-up in 2020)  Interval History: [name] presents today for hospital f/u - feeling back to baseline.  No further L arm-neck pain.  She claims now that she did not have CP leading to ER visit - was arm pain.   Still has DOE with walking "too much", but no real change.  No exertional CP.  No sensation of rapid HR - unless she is in a rush.  Otherwise, does not notice Afib. No  PND or orthopnea - stable edema with support stockings & low dose lasix.  More troubled by knee swelling. No TIA/amaurosis fugax. No syncope or near syncope. No bleeding issues. No claudication while walking around.  ROS: A comprehensive was performed. Review of Systems  Constitutional: Negative for chills, fever, malaise/fatigue and weight loss.  HENT: Negative for congestion and nosebleeds.   Respiratory: Negative for cough, sputum production, shortness of breath (Getting better) and wheezing.   Cardiovascular: Negative for claudication.  Gastrointestinal: Negative for blood in stool, constipation, heartburn and melena.  Genitourinary: Negative for hematuria.  Musculoskeletal: Positive for back pain and joint pain (sprained her R knee - off & on swelling ). Negative for falls.  Neurological: Positive for tingling (off & on in L hand). Negative for dizziness and focal weakness.  Psychiatric/Behavioral: Negative for memory loss. The patient is not nervous/anxious and does not have insomnia.   All other systems reviewed and are negative.  I have reviewed and (if needed) personally updated the patient's problem list, medications, allergies, past medical and surgical history, social and family history.   Past Medical History:  Diagnosis Date  . AAA (abdominal aortic aneurysm) ([place])   . Anxiety   . Bladder cancer ([place]) dx'd 2011   Chronic microscopic hematuria; transitional cell cancer  . Blood transfusion 1960   "related  to hysterectomy"  . Chronic atrial fibrillation    Anticoagulated with warfarin, rate control with diltiazem and Toprol  . Chronic back pain   . Chronic headache    "probably 2/wk" (08/28/2015)  . Concussion w/o coma 4/33/2951   Complicated by subarachnoid hemorrhage.  "even now has times when she's not able to comprehend" (05/08/12)  . Coronary artery disease    Nonobstructive  . Diverticulitis   . Diverticulosis   . DVT (deep venous thrombosis), right 2005    "right calf after 8 foot fall"  . Edema of both legs    Chronic, thought to be secondary to DVTs  . External hemorrhoids   . Family history of adverse reaction to anesthesia 2012   daughter "had OR for crushed hand; had problems w/anesthesia & I was in there all day long" (  . Fatty liver   . Frequent UTI   . GERD (gastroesophageal reflux disease)   . H/O hiatal hernia   . High cholesterol   . Migraine    "rare now" (08/28/2015)  . Pneumonia ~ 2010;  2013; 07/2015  . Rheumatoid arthritis(714.0)    "hands" (08/28/2015)  . Urinary frequency     Past Surgical History:  Procedure Laterality Date  . BREAST CYST EXCISION Left 1960's?   2 cysts; benign  . CARDIAC CATHETERIZATION  1980's  . CATARACT EXTRACTION W/ INTRAOCULAR LENS  IMPLANT, BILATERAL Bilateral 1992  . CYSTOSCOPY  11/11/2011   Procedure: CYSTOSCOPY;  Surgeon: Malka So;  Location: [facility];  Service: Urology;  Laterality: N/A;  . CYSTOSTOMY W/ BLADDER BIOPSY  10/08/2013  . INCONTINENCE SURGERY  1980's  . JOINT REPLACEMENT    . KNEE ARTHROSCOPY Right 2005   S/P fall  . Lower Extremity Venous Doppler  11/10/2013   No DVT or superficial thrombus enlarged inguinal lymph node noted in the right. No Baker's cyst.  . TONSILLECTOMY  1952  . TOTAL KNEE ARTHROPLASTY Right 02/05/2013   Procedure: TOTAL KNEE ARTHROPLASTY;  Surgeon: Mauri Pole, MD;  Location: [facility];  Service: Orthopedics;  Laterality: Right;  . TRANSTHORACIC ECHOCARDIOGRAM  01/'15; 4/'19   a) EF 60-65%. NO RWMA. Ao Sclerosis. MAC - no MS with mild MR>  B Atriae mildly dilated;; b)  EF 55-60%. AoV Sclerosis (no stenosis).  Trivial AI & MR. Mild LAE & Severe RAE.. Mild RV dilation  . TRANSURETHRAL RESECTION OF BLADDER TUMOR  11/11/2011   Procedure: TRANSURETHRAL RESECTION OF BLADDER TUMOR (TURBT);  Surgeon: Malka So;  Location: [facility];  Service: Urology;  Laterality: N/A;  Cysto, Bladder Biopsy, TURBT with [place],   . TRANSURETHRAL RESECTION OF BLADDER TUMOR  WITH GYRUS (TURBT-GYRUS)  2007; 2009; 2010   "for tumors on surface of bladder"  . TUMOR EXCISION  1976; 1980's   "fatty tumor cut off her upper back; left thumb"  . VAGINAL HYSTERECTOMY  1960   partial     Current Meds  Medication Sig  . calcium carbonate (OS-CAL - DOSED IN MG OF ELEMENTAL CALCIUM) 1250 MG tablet Take 1 tablet by mouth every morning.   . cholecalciferol (VITAMIN D) 1000 UNITS tablet Take 1,000 Units by mouth daily.  Marland Kitchen diltiazem (CARDIZEM CD) 240 MG 24 hr capsule Take 1 capsule (240 mg total) by mouth daily.  . furosemide (LASIX) 20 MG tablet Take 1 tablet (20 mg total) by mouth daily. (Patient taking differently: Take 20-40 mg by mouth daily. Take one tablet (20MG) by mouth daily except on Mon, Wed, Friday take  2 tablets (40 MG))  . Multiple Vitamins-Minerals (MULTIVITAMINS THER. W/MINERALS) TABS Take 1 tablet by mouth daily.   . Omega-3 Fatty Acids (FISH OIL) 1000 MG CAPS Take 1,000 mg by mouth daily.  Marland Kitchen omeprazole (PRILOSEC) 20 MG capsule Take 20 mg by mouth 2 (two) times daily.  . polyethylene glycol (MIRALAX / GLYCOLAX) packet Take 17 g by mouth daily as needed for moderate constipation.  . psyllium (METAMUCIL) 58.6 % powder Take 1 packet by mouth 2 (two) times daily.   Marland Kitchen spironolactone (ALDACTONE) 25 MG tablet Take 25 mg by mouth every morning.  . warfarin (COUMADIN) 5 MG tablet Take 2.5-5 mg by mouth See admin instructions. Takes 1 tablet (5 mg) daily EXCEPT for 0.5 tablet (2.5 mg) on Tuesdays and Saturdays    Allergies  Allergen Reactions  . Contrast Media [Iodinated Diagnostic Agents] Hives  . Amoxicillin Itching  . Ampicillin Itching  . Levaquin [Levofloxacin] Itching and Rash  . Penicillins Rash    "haven't had it in years"  . Vancomycin Itching and Rash    Social History   Tobacco Use  . Smoking status: Former Smoker    Packs/day: 1.00    Years: 40.00    Pack years: 40.00    Types: Cigarettes    Last attempt to quit: 11/07/1982    Years since  quitting: 35.9  . Smokeless tobacco: Former Systems developer    Types: Snuff  Substance Use Topics  . Alcohol use: Yes    Alcohol/week: 7.0 standard drinks    Types: 7 Glasses of wine per week    Comment: 1 glass of wine prior to bed  . Drug use: No   Social History   Social History Narrative   She is a widowed mother of 36, grandmother of 11, great grandmother of 83. She does not   really get routine exercise. She quit smoking in 1983. She does not smoke and does not use illicit drugs and does not drink.    family history includes Anesthesia problems in her daughter; Breast cancer in her other; Heart disease in her brother; Prostate cancer in her brother.  Wt Readings from Last 3 Encounters:  10/03/18 162 lb (73.5 kg)  08/30/18 159 lb (72.1 kg)  02/08/18 164 lb 12.8 oz (74.8 kg)    PHYSICAL EXAM BP 136/63   Pulse 88   Ht 5' 5"  (1.651 m)   Wt 162 lb (73.5 kg)   SpO2 98%   BMI 26.96 kg/m  Physical Exam  Constitutional: She is oriented to person, place, and time. She appears well-developed and well-nourished. No distress.  Very hard of hearing.  Well-groomed.  HENT:  Head: Normocephalic and atraumatic.  Neck: Normal range of motion. Neck supple. No hepatojugular reflux and no JVD present. Carotid bruit is not present.  Cardiovascular: Normal rate, S1 normal, S2 normal and normal pulses. An irregularly irregular rhythm present. PMI is not displaced. Exam reveals no gallop and no friction rub.  Murmur heard.  Medium-pitched harsh crescendo-decrescendo early systolic murmur is present with a grade of 1/6 at the upper right sternal border. Pulmonary/Chest: Effort normal and breath sounds normal. No respiratory distress. She has no wheezes. She has no rales.  Abdominal: Soft. Bowel sounds are normal. She exhibits no distension. There is no tenderness. There is no rebound.  Musculoskeletal: Normal range of motion. She exhibits edema (1-2+. R> L with support hose in place.).  Neurological: She  is alert and oriented to person, place, and time.  Psychiatric: She  has a normal mood and affect. Her behavior is normal. Judgment and thought content normal.  Nursing note and vitals reviewed.   Adult ECG Report Not checked   Other studies Reviewed: Additional studies/ records that were reviewed today include:  Recent Labs:   Lab Results  Component Value Date   INR 2.16 08/31/2018   INR 2.21 08/30/2018   INR 1.83 02/03/2018   Lab Results  Component Value Date   CREATININE 0.83 08/30/2018   BUN 8 08/30/2018   NA 136 08/30/2018   K 3.5 08/30/2018   CL 100 08/30/2018   CO2 24 08/30/2018   Lab Results  Component Value Date   WBC 6.6 08/30/2018   HGB 13.3 08/30/2018   HCT 40.5 08/30/2018   MCV 97.8 08/30/2018   PLT 238 08/30/2018   No results found for: CHOL, HDL, LDLCALC, LDLDIRECT, TRIG, CHOLHDL -from K PN April 2019-TC 131, TG 84, HDL 57, LDL 57.  A1c 5.9.  BUN/Cr 8/0.3.   ASSESSMENT / PLAN: Problem List Items Addressed This Visit    AAA (abdominal aortic aneurysm) ([place]) (Chronic)    Relatively stable by ultrasound back in 2018.  Could consider reevaluating in 1 year.      Chronic anticoagulation    Tolerating warfarin.  No major bleeding issues.  Would be okay to hold for procedures.      Chronic atrial fibrillation (Chronic)    Relatively asymptomatic, rate controlled.  No signs yet of chronotropic competence or tachybradycardia syndrome. Is on stable dose of diltiazem.      Edema of both legs (Chronic)    Chronic venous stasis from history of DVTs.  On warfarin. Support hose. She is having more swelling than usual.  We will increase her dose of Lasix to 2 tablets on Monday Wednesday Friday.      Hypertension (Chronic)    Currently stable with medications as noted.  No change      Left ventricular diastolic dysfunction, NYHA class 2 (Chronic)    Suggestion of diastolic dysfunction, however cannot confirm this on echo with A. fib. Would want to have  some afterload reduction, but I am reluctant to push her blood pressure control too much.  She is on diltiazem and spironolactone.  Next option would be ARB at low-dose.      Non-rheumatic aortic sclerosis (HCC) - Primary (Chronic)    Aortic sclerosis confirmed on most recent echocardiogram.  No signs of stenosis. Explains murmur. Unlikely getting worse.      Precordial pain    Chest pain was probably not cardiac in nature.  Musculoskeletal.  Would not work-up further.        She denies having any any issues of chest pain.  Current medicines are reviewed at length with the patient today. (+/- concerns) n/a The following changes have been made: n/a  Patient Instructions  Medication Instructions:  NOT NEEDED If you need a refill on your cardiac medications before your next appointment, please call your pharmacy.   Lab work: NOT NEEDED If you have labs (blood work) drawn today and your tests are completely normal, you will receive your results only by: Marland Kitchen MyChart Message (if you have MyChart) OR . A paper copy in the mail If you have any lab test that is abnormal or we need to change your treatment, we will call you to review the results.  Testing/Procedures: NOT NEEDED Follow-Up: At [facility], you and your health needs are our priority.  As part of our continuing  mission to provide you with exceptional heart care, we have created designated Provider Care Teams.  These Care Teams include your primary Cardiologist (physician) and Advanced Practice Providers (APPs -  Physician Assistants and Nurse Practitioners) who all work together to provide you with the care you need, when you need it. You will need a follow up appointment in 6 months.  Please call our office 2 months in advance to schedule this appointment.  You may see Glenetta Hew, MD or one of the following Advanced Practice Providers on your designated Care Team:   Rosaria Ferries, PA-C . Jory Sims, DNP,  ANP  Any Other Special Instructions Will Be Listed Below (If Applicable). NOT NEEDED     Studies Ordered:   No orders of the defined types were placed in this encounter.     Glenetta Hew, M.D., M.S. Interventional Cardiologist   Pager # [phone] Phone # [phone] [address] [place], [place] [address]   Thank you for choosing [facility] at [facility]!!

## 2018-10-05 ENCOUNTER — Encounter: Payer: Self-pay | Admitting: Cardiology

## 2018-10-05 NOTE — Assessment & Plan Note (Signed)
Relatively stable by ultrasound back in 2018.  Could consider reevaluating in 1 year.

## 2018-10-05 NOTE — Assessment & Plan Note (Signed)
Tolerating warfarin.  No major bleeding issues.  Would be okay to hold for procedures.

## 2018-10-05 NOTE — Assessment & Plan Note (Signed)
Stable.  No active symptoms.  No further studies

## 2018-10-05 NOTE — Assessment & Plan Note (Signed)
Relatively asymptomatic, rate controlled.  No signs yet of chronotropic competence or tachybradycardia syndrome. Is on stable dose of diltiazem.

## 2018-10-05 NOTE — Assessment & Plan Note (Signed)
Chest pain was probably not cardiac in nature.  Musculoskeletal.  Would not work-up further.

## 2018-10-05 NOTE — Assessment & Plan Note (Signed)
Currently stable with medications as noted.  No change

## 2018-10-05 NOTE — Assessment & Plan Note (Signed)
Aortic sclerosis confirmed on most recent echocardiogram.  No signs of stenosis. Explains murmur. Unlikely getting worse.

## 2018-10-05 NOTE — Assessment & Plan Note (Deleted)
Follow to give her medical associates with the [facility], [facility] from [place]

## 2018-10-05 NOTE — Assessment & Plan Note (Signed)
Chronic venous stasis from history of DVTs.  On warfarin. Support hose. She is having more swelling than usual.  We will increase her dose of Lasix to 2 tablets on Monday Wednesday Friday.

## 2018-10-05 NOTE — Assessment & Plan Note (Signed)
Suggestion of diastolic dysfunction, however cannot confirm this on echo with A. fib. Would want to have some afterload reduction, but I am reluctant to push her blood pressure control too much.  She is on diltiazem and spironolactone.  Next option would be ARB at low-dose.

## 2018-10-12 DIAGNOSIS — Z7901 Long term (current) use of anticoagulants: Secondary | ICD-10-CM | POA: Diagnosis not present

## 2018-10-12 DIAGNOSIS — I4891 Unspecified atrial fibrillation: Secondary | ICD-10-CM | POA: Diagnosis not present

## 2018-10-18 ENCOUNTER — Encounter: Payer: Self-pay | Admitting: Family Medicine

## 2018-10-18 ENCOUNTER — Ambulatory Visit (INDEPENDENT_AMBULATORY_CARE_PROVIDER_SITE_OTHER): Payer: PPO | Admitting: Family Medicine

## 2018-10-18 VITALS — BP 160/91 | HR 128 | Temp 97.8°F | Ht 65.0 in | Wt 157.4 lb

## 2018-10-18 DIAGNOSIS — R52 Pain, unspecified: Secondary | ICD-10-CM | POA: Diagnosis not present

## 2018-10-18 DIAGNOSIS — J181 Lobar pneumonia, unspecified organism: Secondary | ICD-10-CM

## 2018-10-18 DIAGNOSIS — I4891 Unspecified atrial fibrillation: Secondary | ICD-10-CM

## 2018-10-18 DIAGNOSIS — J189 Pneumonia, unspecified organism: Secondary | ICD-10-CM

## 2018-10-18 LAB — VERITOR FLU A/B WAIVED
Influenza A: NEGATIVE
Influenza B: NEGATIVE

## 2018-10-18 MED ORDER — PREDNISONE 20 MG PO TABS: ORAL_TABLET | ORAL | 0 refills | Status: DC

## 2018-10-18 MED ORDER — CEFTRIAXONE SODIUM 1 G IJ SOLR
1.0000 g | Freq: Once | INTRAMUSCULAR | Status: AC
  Administered 2018-10-18: 1 g via INTRAMUSCULAR

## 2018-10-18 MED ORDER — AZITHROMYCIN 250 MG PO TABS: ORAL_TABLET | ORAL | 0 refills | Status: DC

## 2018-10-18 NOTE — Progress Notes (Signed)
BP (!) 160/91   Pulse (!) 128   Temp 97.8 F (36.6 C) (Oral)   Ht 5\' 5"  (1.651 m)   Wt 157 lb 6.4 oz (71.4 kg)   BMI 26.19 kg/m    Subjective:    Patient ID: [name], female    DOB: 1921/05/30, 84 y.o.   MRN: [id number]  HPI: [name] is a 84 y.o. female presenting on 10/18/2018 for Cough (x 1 day); Nasal Congestion; Headache; Cough; and Generalized Body Aches   HPI Cough congestion wheezing and shortness of breath and body aches Patient comes in complaining of cough and congestion and wheezing and shortness of breath and body aches is been going on for the past day.  She feels like things been getting worse and she started to feel nauseous and having a little bit of diarrhea as well.  She has been in a lot of drainage and her cough is very productive of phlegm.  She says she normally has a wheezing but it is worse than what it normally is.  She feels a little short of breath but does not know if that is worse than where she normally is.  She denies any fevers or chills but she does complain of generalized myalgias and body aches and especially in her right upper back.  Relevant past medical, surgical, family and social history reviewed and updated as indicated. Interim medical history since our last visit reviewed. Allergies and medications reviewed and updated.  Review of Systems  Constitutional: Negative for chills and fever.  HENT: Positive for congestion, postnasal drip and sore throat. Negative for ear discharge, ear pain, rhinorrhea, sinus pressure and sneezing.   Eyes: Negative for pain, redness and visual disturbance.  Respiratory: Positive for cough and wheezing. Negative for chest tightness and shortness of breath.   Cardiovascular: Negative for chest pain and leg swelling.  Genitourinary: Negative for difficulty urinating and dysuria.  Musculoskeletal: Positive for myalgias. Negative for back pain and gait problem.  Skin: Negative for rash.    Neurological: Negative for light-headedness and headaches.  Psychiatric/Behavioral: Negative for agitation and behavioral problems.  All other systems reviewed and are negative.   Per HPI unless specifically indicated above   Allergies as of 10/18/2018      Reactions   Contrast Media [iodinated Diagnostic Agents] Hives   Amoxicillin Itching   Ampicillin Itching   Levaquin [levofloxacin] Itching, Rash   Penicillins Rash   "haven't had it in years"   Vancomycin Itching, Rash      Medication List        Accurate as of 10/18/18  9:38 AM. Always use your most recent med list.          azithromycin 250 MG tablet Commonly known as:  ZITHROMAX Take 2 the first day and then one each day after.   calcium carbonate 1250 (500 Ca) MG tablet Commonly known as:  OS-CAL - dosed in mg of elemental calcium Take 1 tablet by mouth every morning.   cholecalciferol 1000 units tablet Commonly known as:  VITAMIN D Take 1,000 Units by mouth daily.   diltiazem 240 MG 24 hr capsule Commonly known as:  CARDIZEM CD Take 1 capsule (240 mg total) by mouth daily.   Fish Oil 1000 MG Caps Take 1,000 mg by mouth daily.   furosemide 20 MG tablet Commonly known as:  LASIX Take 1 tablet (20 mg total) by mouth daily.   multivitamins ther. w/minerals Tabs tablet Take 1 tablet  by mouth daily.   polyethylene glycol packet Commonly known as:  MIRALAX / GLYCOLAX Take 17 g by mouth daily as needed for moderate constipation.   predniSONE 20 MG tablet Commonly known as:  DELTASONE 2 po at same time daily for 5 days   PRILOSEC 20 MG capsule Generic drug:  omeprazole Take 20 mg by mouth 2 (two) times daily.   psyllium 58.6 % powder Commonly known as:  METAMUCIL Take 1 packet by mouth 2 (two) times daily.   spironolactone 25 MG tablet Commonly known as:  ALDACTONE Take 25 mg by mouth every morning.   warfarin 5 MG tablet Commonly known as:  COUMADIN Take 2.5-5 mg by mouth See admin  instructions. Takes 1 tablet (5 mg) daily EXCEPT for 0.5 tablet (2.5 mg) on Tuesdays and Saturdays          Objective:    BP (!) 160/91   Pulse (!) 128   Temp 97.8 F (36.6 C) (Oral)   Ht 5\' 5"  (1.651 m)   Wt 157 lb 6.4 oz (71.4 kg)   BMI 26.19 kg/m   Wt Readings from Last 3 Encounters:  10/18/18 157 lb 6.4 oz (71.4 kg)  10/03/18 162 lb (73.5 kg)  08/30/18 159 lb (72.1 kg)    Physical Exam  Constitutional: She is oriented to person, place, and time. She appears well-developed and well-nourished. No distress.  HENT:  Right Ear: Tympanic membrane, external ear and ear canal normal.  Left Ear: Tympanic membrane, external ear and ear canal normal.  Nose: Mucosal edema and rhinorrhea present. No epistaxis. Right sinus exhibits no maxillary sinus tenderness and no frontal sinus tenderness. Left sinus exhibits no maxillary sinus tenderness and no frontal sinus tenderness.  Mouth/Throat: Uvula is midline and mucous membranes are normal. Posterior oropharyngeal edema and posterior oropharyngeal erythema present. No oropharyngeal exudate or tonsillar abscesses.  Eyes: Conjunctivae are normal.  Cardiovascular: Normal heart sounds and intact distal pulses. An irregularly irregular rhythm present. Tachycardia present.  No murmur heard. Pulmonary/Chest: Effort normal. No respiratory distress. She has wheezes (Left lower lobe). She has rhonchi. She has rales (Lower lobe).  Musculoskeletal: Normal range of motion. She exhibits no edema or tenderness.  Neurological: She is alert and oriented to person, place, and time. Coordination normal.  Skin: Skin is warm and dry. No rash noted. She is not diaphoretic.  Psychiatric: She has a normal mood and affect. Her behavior is normal.  Nursing note and vitals reviewed.     Assessment & Plan:   Problem List Items Addressed This Visit    None    Visit Diagnoses    Community acquired pneumonia of right lower lobe of lung ([place])    -  Primary    Relevant Medications   cefTRIAXone (ROCEPHIN) injection 1 g (Completed)   azithromycin (ZITHROMAX) 250 MG tablet   predniSONE (DELTASONE) 20 MG tablet   Other Relevant Orders   Veritor Flu A/B Waived   Atrial fibrillation with RVR ([place])          Right lower lobe pneumonia with slight A. fib and RVR patient had not taken her medications yet today for A. fib, go home and take them and monitor, she is on Coumadin will need to go back in 1 week and get that rechecked will give Rocephin and azithromycin and prednisone.  If anything worsens go to the emergency department  Patient had not taken her diltiazem today, recommended for her to go home and take that and then  monitor her heart rate with her blood pressure monitor and let us know if it continues to be elevated Follow up plan: Return if symptoms worsen or fail to improve.  Counseling provided for all of the vaccine components Orders Placed This Encounter  Procedures  . Veritor Flu A/B [place], MD [facility] 10/18/2018, 9:38 AM

## 2018-10-26 ENCOUNTER — Ambulatory Visit (INDEPENDENT_AMBULATORY_CARE_PROVIDER_SITE_OTHER): Payer: PPO | Admitting: Family Medicine

## 2018-10-26 ENCOUNTER — Encounter: Payer: Self-pay | Admitting: Family Medicine

## 2018-10-26 VITALS — BP 139/80 | HR 75 | Temp 97.0°F | Ht 65.0 in | Wt 159.0 lb

## 2018-10-26 DIAGNOSIS — I4891 Unspecified atrial fibrillation: Secondary | ICD-10-CM | POA: Diagnosis not present

## 2018-10-26 DIAGNOSIS — J9801 Acute bronchospasm: Secondary | ICD-10-CM | POA: Diagnosis not present

## 2018-10-26 DIAGNOSIS — Z7901 Long term (current) use of anticoagulants: Secondary | ICD-10-CM | POA: Diagnosis not present

## 2018-10-26 LAB — COAGUCHEK XS/INR WAIVED
INR: 1.2 — ABNORMAL HIGH (ref 0.9–1.1)
Prothrombin Time: 14.2 s

## 2018-10-26 MED ORDER — ALBUTEROL SULFATE (2.5 MG/3ML) 0.083% IN NEBU
2.5000 mg | INHALATION_SOLUTION | Freq: Once | RESPIRATORY_TRACT | Status: AC
  Administered 2018-10-26: 2.5 mg via RESPIRATORY_TRACT

## 2018-10-26 MED ORDER — ALBUTEROL SULFATE HFA 108 (90 BASE) MCG/ACT IN AERS: 2.0000 | INHALATION_SPRAY | Freq: Four times a day (QID) | RESPIRATORY_TRACT | 0 refills | Status: DC | PRN

## 2018-10-26 NOTE — Progress Notes (Signed)
Subjective: CC: INR check, PNA PCP: [name], MD YJE:HUDJSHF [name] is a 84 y.o. female presenting to clinic today for:  1.  Chronic anticoagulation for atrial fibrillation and history of DVT Patient is currently under the care of Dr. Maudie Mercury, who is her PCP.  She was seen as an acute patient on 10/18/2018, by Dr. Warrick Parisian, for pneumonia.  She was treated with azithromycin and instructed to follow-up in 1 week for INR recheck.  Her last INR was about 1 month ago and was therapeutic at 2.6.  She is been compliant with her Coumadin dose, which she describes as 2 tablets Monday, Wednesday, Friday and Saturday.  She takes 1 tablet all other days.  She does not report any worsening shortness of breath, in fact her cough and wheeze seem to be getting better but the wheeze is still present.  She has completed the antibiotics as prescribed.  She has been using an incentive spirometer from the hospital at home as well but she feels like it is prompting coughing spells.  No hemoptysis.   ROS: Per HPI  Allergies  Allergen Reactions  . Contrast Media [Iodinated Diagnostic Agents] Hives  . Amoxicillin Itching  . Ampicillin Itching  . Levaquin [Levofloxacin] Itching and Rash  . Penicillins Rash    "haven't had it in years"  . Vancomycin Itching and Rash   Past Medical History:  Diagnosis Date  . AAA (abdominal aortic aneurysm) ([place])   . Anxiety   . Bladder cancer ([place]) dx'd 2011   Chronic microscopic hematuria; transitional cell cancer  . Blood transfusion 1960   "related to hysterectomy"  . Chronic atrial fibrillation    Anticoagulated with warfarin, rate control with diltiazem and Toprol  . Chronic back pain   . Chronic headache    "probably 2/wk" (08/28/2015)  . Concussion w/o coma 0/26/3785   Complicated by subarachnoid hemorrhage.  "even now has times when she's not able to comprehend" (05/08/12)  . Coronary artery disease    Nonobstructive  . Diverticulitis   . Diverticulosis   .  DVT (deep venous thrombosis), right 2005   "right calf after 8 foot fall"  . Edema of both legs    Chronic, thought to be secondary to DVTs  . External hemorrhoids   . Family history of adverse reaction to anesthesia 2012   daughter "had OR for crushed hand; had problems w/anesthesia & I was in there all day long" (  . Fatty liver   . Frequent UTI   . GERD (gastroesophageal reflux disease)   . H/O hiatal hernia   . High cholesterol   . Migraine    "rare now" (08/28/2015)  . Pneumonia ~ 2010;  2013; 07/2015  . Rheumatoid arthritis(714.0)    "hands" (08/28/2015)  . Urinary frequency     Current Outpatient Medications:  .  azithromycin (ZITHROMAX) 250 MG tablet, Take 2 the first day and then one each day after., Disp: 6 tablet, Rfl: 0 .  calcium carbonate (OS-CAL - DOSED IN MG OF ELEMENTAL CALCIUM) 1250 MG tablet, Take 1 tablet by mouth every morning. , Disp: , Rfl:  .  cholecalciferol (VITAMIN D) 1000 UNITS tablet, Take 1,000 Units by mouth daily., Disp: , Rfl:  .  diltiazem (CARDIZEM CD) 240 MG 24 hr capsule, Take 1 capsule (240 mg total) by mouth daily., Disp: 30 capsule, Rfl: 0 .  furosemide (LASIX) 20 MG tablet, Take 1 tablet (20 mg total) by mouth daily. (Patient taking differently: Take 20-40  mg by mouth daily. Take one tablet (20MG ) by mouth daily except on Mon, Wed, Friday take 2 tablets (40 MG)), Disp: 30 tablet, Rfl: 0 .  Multiple Vitamins-Minerals (MULTIVITAMINS THER. W/MINERALS) TABS, Take 1 tablet by mouth daily. , Disp: , Rfl:  .  Omega-3 Fatty Acids (FISH OIL) 1000 MG CAPS, Take 1,000 mg by mouth daily., Disp: , Rfl:  .  omeprazole (PRILOSEC) 20 MG capsule, Take 20 mg by mouth 2 (two) times daily., Disp: , Rfl:  .  polyethylene glycol (MIRALAX / GLYCOLAX) packet, Take 17 g by mouth daily as needed for moderate constipation., Disp: 14 each, Rfl: 0 .  predniSONE (DELTASONE) 20 MG tablet, 2 po at same time daily for 5 days, Disp: 10 tablet, Rfl: 0 .  psyllium (METAMUCIL) 58.6 %  powder, Take 1 packet by mouth 2 (two) times daily. , Disp: , Rfl:  .  spironolactone (ALDACTONE) 25 MG tablet, Take 25 mg by mouth every morning., Disp: , Rfl:  .  warfarin (COUMADIN) 5 MG tablet, Take 2.5-5 mg by mouth See admin instructions. Takes 1 tablet (5 mg) daily EXCEPT for 0.5 tablet (2.5 mg) on Tuesdays and Saturdays, Disp: , Rfl:  Social History   Socioeconomic History  . Marital status: Widowed    Spouse name: Not on file  . Number of children: 3  . Years of education: Not on file  . Highest education level: 7th grade  Occupational History  . Occupation: Retired  Scientific laboratory technician  . Financial resource strain: Not hard at all  . Food insecurity:    Worry: Never true    Inability: Never true  . Transportation needs:    Medical: No    Non-medical: No  Tobacco Use  . Smoking status: Former Smoker    Packs/day: 1.00    Years: 40.00    Pack years: 40.00    Types: Cigarettes    Last attempt to quit: 11/07/1982    Years since quitting: 35.9  . Smokeless tobacco: Former Systems developer    Types: Snuff  Substance and Sexual Activity  . Alcohol use: Yes    Alcohol/week: 7.0 standard drinks    Types: 7 Glasses of wine per week    Comment: 1 glass of wine prior to bed  . Drug use: No  . Sexual activity: Never  Lifestyle  . Physical activity:    Days per week: 7 days    Minutes per session: 30 min  . Stress: Only a little  Relationships  . Social connections:    Talks on phone: More than three times a week    Gets together: More than three times a week    Attends religious service: More than 4 times per year    Active member of club or organization: No    Attends meetings of clubs or organizations: Never    Relationship status: Widowed  . Intimate partner violence:    Fear of current or ex partner: No    Emotionally abused: No    Physically abused: No    Forced sexual activity: No  Other Topics Concern  . Not on file  Social History Narrative   She is a widowed mother of 100,  grandmother of 37, great grandmother of 56. She does not   really get routine exercise. She quit smoking in 1983. She does not smoke and does not use illicit drugs and does not drink.   Family History  Problem Relation Age of Onset  . Anesthesia problems Daughter   .  Prostate cancer Brother   . Breast cancer Other        neice  . Heart disease Brother   . Colon cancer Neg Hx     Objective: Office vital signs reviewed. BP 139/80   Pulse 75   Temp (!) 97 F (36.1 C) (Oral)   Ht 5\' 5"  (1.651 m)   Wt 159 lb (72.1 kg)   SpO2 96%   BMI 26.46 kg/m   Physical Examination:  General: Awake, alert, well nourished, well appearing. No acute distress Cardio: Irregularly irregular.  S1S2 heard, no murmurs appreciated Pulm: Mild, global expiratory wheeze noted.  No rhonchi or rales.  Good air movement.  Normal work of breathing on room air  Assessment/ Plan: 84 y.o. female   1. Atrial fibrillation with RVR ([facility]) INR was subtherapeutic at 1.2 today.  I have advised that she increase her Coumadin regimen to 2 tablets daily for the next week.  She is to return for INR recheck in 1 week.  I suspect that she will likely be able to resume previous dose.  Additionally, she is a primary patient of Dr. Julianne Rice.  We discussed that the office is no longer seeing patients for acute care needs and that we would not be able to manage Coumadin without her establishing care here formally.  She is considering moving her care to this office again and will let us know at next visit if she decides to go ahead and move her care over, as Dr. Maudie Mercury has been unavailable for the last couple of months. - CoaguChek XS/INR Waived  2. Chronic anticoagulation As above.  3. Bronchospasm Likely related to recent pulmonary infection.  She was given a dose of albuterol via nebulizer in office, which did totally resolve her wheezes and improve her breathing.  For this reason, I have prescribed her an albuterol inhaler to use 2  puffs every 6 hours as needed for wheeze or shortness of breath.  We discussed the possible side effect of tachycardia related to the medicine and I did ask her to use it only as needed.  She will follow-up as needed as directed with her primary care doctor. - albuterol (PROVENTIL) (2.5 MG/3ML) 0.083% nebulizer solution 2.5 mg   Orders Placed This Encounter  Procedures  . CoaguChek XS/INR Waived   Meds ordered this encounter  Medications  . albuterol (PROVENTIL) (2.5 MG/3ML) 0.083% nebulizer solution 2.5 mg  . albuterol (PROVENTIL HFA;VENTOLIN HFA) 108 (90 Base) MCG/ACT inhaler    Sig: Inhale 2 puffs into the lungs every 6 (six) hours as needed for wheezing or shortness of breath.    Dispense:  1 Inhaler    Refill:  [place], [place] [phone]

## 2018-10-26 NOTE — Patient Instructions (Addendum)
Your blood is thick today.  Likely because you have been using azithromycin antibiotic. Increase your coumadin to 2 tablets every day.  Come back in 1 week for recheck of your INR.  You were treated with a dose of albuterol here in office to help your coughing and wheeze.   How to Use a Metered Dose Inhaler A metered dose inhaler is a handheld device for taking medicine that must be breathed into the lungs (inhaled). The device can be used to deliver a variety of inhaled medicines, including:  Quick relief or rescue medicines, such as bronchodilators.  Controller medicines, such as corticosteroids.  The medicine is delivered by pushing down on a metal canister to release a preset amount of spray and medicine. Each device contains the amount of medicine that is needed for a preset number of uses (inhalations). Your health care provider may recommend that you use a spacer with your inhaler to help you take the medicine more effectively. A spacer is a plastic tube with a mouthpiece on one end and an opening that connects to the inhaler on the other end. A spacer holds the medicine in a tube for a short time, which allows you to inhale more medicine. What are the risks? If you do not use your inhaler correctly, medicine might not reach your lungs to help you breathe. Inhaler medicine can cause side effects, such as:  Mouth or throat infection.  Cough.  Hoarseness.  Headache.  Nausea and vomiting.  Lung infection (pneumonia) in people who have a lung condition called COPD.  How to use a metered dose inhaler without a spacer 1. Remove the cap from the inhaler. 2. If you are using the inhaler for the first time, shake it for 5 seconds, turn it away from your face, then release 4 puffs into the air. This is called priming. 3. Shake the inhaler for 5 seconds. 4. Position the inhaler so the top of the canister faces up. 5. Put your index finger on the top of the medicine canister. Support  the bottom of the inhaler with your thumb. 6. Breathe out normally and as completely as possible, away from the inhaler. 7. Either place the inhaler between your teeth and close your lips tightly around the mouthpiece, or hold the inhaler 1-2 inches (2.5-5 cm) away from your open mouth. Keep your tongue down out of the way. If you are unsure which technique to use, ask your health care provider. 8. Press the canister down with your index finger to release the medicine, then inhale deeply and slowly through your mouth (not your nose) until your lungs are completely filled. Inhaling should take 4-6 seconds. 9. Hold the medicine in your lungs for 5-10 seconds (10 seconds is best). This helps the medicine get into the small airways of your lungs. 10. With your lips in a tight circle (pursed), breathe out slowly. 11. Repeat steps 3-10 until you have taken the number of puffs that your health care provider directed. Wait about 1 minute between puffs or as directed. 12. Put the cap on the inhaler. 13. If you are using a steroid inhaler, rinse your mouth with water, gargle, and spit out the water. Do not swallow the water. How to use a metered dose inhaler with a spacer 1. Remove the cap from the inhaler. 2. If you are using the inhaler for the first time, shake it for 5 seconds, turn it away from your face, then release 4 puffs into the air.  This is called priming. 3. Shake the inhaler for 5 seconds. 4. Place the open end of the spacer onto the inhaler mouthpiece. 5. Position the inhaler so the top of the canister faces up and the spacer mouthpiece faces you. 6. Put your index finger on the top of the medicine canister. Support the bottom of the inhaler and the spacer with your thumb. 7. Breathe out normally and as completely as possible, away from the spacer. 8. Place the spacer between your teeth and close your lips tightly around it. Keep your tongue down out of the way. 9. Press the canister down with  your index finger to release the medicine, then inhale deeply and slowly through your mouth (not your nose) until your lungs are completely filled. Inhaling should take 4-6 seconds. 10. Hold the medicine in your lungs for 5-10 seconds (10 seconds is best). This helps the medicine get into the small airways of your lungs. 11. With your lips in a tight circle (pursed), breathe out slowly. 12. Repeat steps 3-11 until you have taken the number of puffs that your health care provider directed. Wait about 1 minute between puffs or as directed. 13. Remove the spacer from the inhaler and put the cap on the inhaler. 14. If you are using a steroid inhaler, rinse your mouth with water, gargle, and spit out the water. Do not swallow the water. Follow these instructions at home:  Take your inhaled medicine only as told by your health care provider. Do not use the inhaler more than directed by your health care provider.  Keep all follow-up visits as told by your health care provider. This is important.  If your inhaler has a counter, you can check it to determine how full your inhaler is. If your inhaler does not have a counter, ask your health care provider when you will need to refill your inhaler and write the refill date on a calendar or on your inhaler canister. Note that you cannot know when an inhaler is empty by shaking it.  Follow directions on the package insert for care and cleaning of your inhaler and spacer. Contact a health care provider if:  Symptoms are only partially relieved with your inhaler.  You are having trouble using your inhaler.  You have an increase in phlegm.  You have headaches. Get help right away if:  You feel little or no relief after using your inhaler.  You have dizziness.  You have a fast heart rate.  You have chills or a fever.  You have night sweats.  There is blood in your phlegm. Summary  A metered dose inhaler is a handheld device for taking medicine  that must be breathed into the lungs (inhaled).  The medicine is delivered by pushing down on a metal canister to release a preset amount of spray and medicine.  Each device contains the amount of medicine that is needed for a preset number of uses (inhalations). This information is not intended to replace advice given to you by your health care provider. Make sure you discuss any questions you have with your health care provider. Document Released: 11/29/2005 Document Revised: 10/19/2016 Document Reviewed: 10/19/2016 Elsevier Interactive Patient Education  2017 [employer].

## 2018-11-01 ENCOUNTER — Telehealth: Payer: Self-pay | Admitting: Pharmacist Clinician (PhC)/ Clinical Pharmacy Specialist

## 2018-11-01 ENCOUNTER — Ambulatory Visit (INDEPENDENT_AMBULATORY_CARE_PROVIDER_SITE_OTHER): Payer: PPO | Admitting: Pharmacist Clinician (PhC)/ Clinical Pharmacy Specialist

## 2018-11-01 DIAGNOSIS — I480 Paroxysmal atrial fibrillation: Secondary | ICD-10-CM | POA: Diagnosis not present

## 2018-11-01 LAB — COAGUCHEK XS/INR WAIVED
INR: 2.8 — ABNORMAL HIGH (ref 0.9–1.1)
Prothrombin Time: 33.7 s

## 2018-11-01 NOTE — Telephone Encounter (Signed)
Patient daughter aware of results and recommendation. Appointment given.

## 2018-11-01 NOTE — Telephone Encounter (Signed)
Please review and advise.

## 2018-11-01 NOTE — Telephone Encounter (Signed)
Her INR was therapeutic today.  Ok to resume previous dosing of 2 tablets Mon, Wed, Friday and 1 tablet daily other days of the week.  Recheck in 1 week.

## 2018-11-08 ENCOUNTER — Encounter: Payer: PPO | Admitting: Pharmacist Clinician (PhC)/ Clinical Pharmacy Specialist

## 2018-11-08 ENCOUNTER — Ambulatory Visit (INDEPENDENT_AMBULATORY_CARE_PROVIDER_SITE_OTHER): Payer: PPO | Admitting: Pharmacist Clinician (PhC)/ Clinical Pharmacy Specialist

## 2018-11-08 DIAGNOSIS — I480 Paroxysmal atrial fibrillation: Secondary | ICD-10-CM

## 2018-11-08 LAB — COAGUCHEK XS/INR WAIVED
INR: 2.6 — AB (ref 0.9–1.1)
Prothrombin Time: 31.6 s

## 2018-11-08 MED ORDER — WARFARIN SODIUM 3 MG PO TABS: ORAL_TABLET | ORAL | 3 refills | Status: DC

## 2018-11-08 NOTE — Patient Instructions (Signed)
Description   Patient is taking 2 tablets on Mondays, Wednesdays, Fridays, and Saturdays and 1 tablet on Tuesdays, Thursdays, and Sundays.  Continue taking it the same way.  INR today is 2.6 (goal is 2-3)  Perfect reading today

## 2018-11-09 ENCOUNTER — Emergency Department (HOSPITAL_BASED_OUTPATIENT_CLINIC_OR_DEPARTMENT_OTHER): Payer: PPO

## 2018-11-09 ENCOUNTER — Encounter (HOSPITAL_BASED_OUTPATIENT_CLINIC_OR_DEPARTMENT_OTHER): Payer: Self-pay | Admitting: *Deleted

## 2018-11-09 ENCOUNTER — Emergency Department (HOSPITAL_BASED_OUTPATIENT_CLINIC_OR_DEPARTMENT_OTHER)
Admission: EM | Admit: 2018-11-09 | Discharge: 2018-11-09 | Disposition: A | Discharge status: Home / Self Care | Payer: PPO | Attending: Emergency Medicine | Admitting: Emergency Medicine

## 2018-11-09 ENCOUNTER — Other Ambulatory Visit: Payer: Self-pay

## 2018-11-09 DIAGNOSIS — Z7901 Long term (current) use of anticoagulants: Secondary | ICD-10-CM | POA: Insufficient documentation

## 2018-11-09 DIAGNOSIS — I10 Essential (primary) hypertension: Secondary | ICD-10-CM | POA: Diagnosis not present

## 2018-11-09 DIAGNOSIS — Z79899 Other long term (current) drug therapy: Secondary | ICD-10-CM | POA: Diagnosis not present

## 2018-11-09 DIAGNOSIS — R55 Syncope and collapse: Secondary | ICD-10-CM | POA: Diagnosis not present

## 2018-11-09 DIAGNOSIS — Z87891 Personal history of nicotine dependence: Secondary | ICD-10-CM | POA: Diagnosis not present

## 2018-11-09 DIAGNOSIS — R42 Dizziness and giddiness: Secondary | ICD-10-CM | POA: Diagnosis not present

## 2018-11-09 DIAGNOSIS — R5383 Other fatigue: Secondary | ICD-10-CM | POA: Diagnosis not present

## 2018-11-09 DIAGNOSIS — Z8551 Personal history of malignant neoplasm of bladder: Secondary | ICD-10-CM | POA: Diagnosis not present

## 2018-11-09 DIAGNOSIS — I2510 Atherosclerotic heart disease of native coronary artery without angina pectoris: Secondary | ICD-10-CM | POA: Insufficient documentation

## 2018-11-09 DIAGNOSIS — R05 Cough: Secondary | ICD-10-CM | POA: Diagnosis not present

## 2018-11-09 DIAGNOSIS — Z96651 Presence of right artificial knee joint: Secondary | ICD-10-CM | POA: Insufficient documentation

## 2018-11-09 LAB — URINALYSIS, ROUTINE W REFLEX MICROSCOPIC
Bilirubin Urine: NEGATIVE
Glucose, UA: NEGATIVE mg/dL
Ketones, ur: NEGATIVE mg/dL
Nitrite: NEGATIVE
Protein, ur: NEGATIVE mg/dL
Specific Gravity, Urine: <1.005 — ABNORMAL LOW (ref 1.005–1.030)
pH: 6.5 (ref 5.0–8.0)

## 2018-11-09 LAB — URINALYSIS, MICROSCOPIC (REFLEX)

## 2018-11-09 LAB — BASIC METABOLIC PANEL
Anion gap: 10 (ref 5–15)
BUN: 10 mg/dL (ref 8–23)
CO2: 25 mmol/L (ref 22–32)
Calcium: 9 mg/dL (ref 8.9–10.3)
Chloride: 98 mmol/L (ref 98–111)
Creatinine, Ser: 0.71 mg/dL (ref 0.44–1.00)
GFR calc Af Amer: >60 mL/min (ref >60)
GFR calc non Af Amer: >60 mL/min (ref >60)
Glucose, Bld: 103 mg/dL — ABNORMAL HIGH (ref 70–99)
Potassium: 3.8 mmol/L (ref 3.5–5.1)
Sodium: 133 mmol/L — ABNORMAL LOW (ref 135–145)

## 2018-11-09 LAB — CBC
HCT: 39.5 % (ref 36.0–46.0)
HEMOGLOBIN: 13.1 g/dL (ref 12.0–15.0)
MCH: 32.8 pg (ref 26.0–34.0)
MCHC: 33.2 g/dL (ref 30.0–36.0)
MCV: 98.8 fL (ref 80.0–100.0)
Platelets: 197 10*3/uL (ref 150–400)
RBC: 4 MIL/uL (ref 3.87–5.11)
RDW: 12.7 % (ref 11.5–15.5)
WBC: 6 10*3/uL (ref 4.0–10.5)
nRBC: 0 % (ref 0.0–0.2)

## 2018-11-09 LAB — TROPONIN I: Troponin I: <0.03 ng/mL (ref <0.03)

## 2018-11-09 LAB — CBG MONITORING, ED: Glucose-Capillary: 99 mg/dL (ref 70–99)

## 2018-11-09 NOTE — ED Notes (Signed)
Patient transported to X-ray 

## 2018-11-09 NOTE — ED Triage Notes (Signed)
She is ambulatory to triage. She is alert and oriented. States while cooking this am she got light headed and weak. EMS was called and she was checked out. She refused transport.

## 2018-11-09 NOTE — ED Provider Notes (Signed)
[facility] EMERGENCY DEPARTMENT Provider Note   CSN: [id number] Arrival date & time: 11/09/18  1139     History   Chief Complaint Chief Complaint  Patient presents with  . Weakness    HPI [name] is a 84 y.o. female.  HPI Patient presents with near syncope.  Was helping cook today began to feel lightheaded and almost passed out.  Reportedly check blood pressure at home and it was 80 systolic.  Feeling somewhat better now.  Feels a little weak overall however.  Mildly decreased oral intake.  Recently had a pneumonia and had been on antibiotics and steroid.  That was around 3 weeks ago.  No chest pain.  No trouble breathing. Past Medical History:  Diagnosis Date  . AAA (abdominal aortic aneurysm) ([place])   . Anxiety   . Bladder cancer ([place]) dx'd 2011   Chronic microscopic hematuria; transitional cell cancer  . Blood transfusion 1960   "related to hysterectomy"  . Chronic atrial fibrillation    Anticoagulated with warfarin, rate control with diltiazem and Toprol  . Chronic back pain   . Chronic headache    "probably 2/wk" (08/28/2015)  . Concussion w/o coma 03/21/8118   Complicated by subarachnoid hemorrhage.  "even now has times when she's not able to comprehend" (05/08/12)  . Coronary artery disease    Nonobstructive  . Diverticulitis   . Diverticulosis   . DVT (deep venous thrombosis), right 2005   "right calf after 8 foot fall"  . Edema of both legs    Chronic, thought to be secondary to DVTs  . External hemorrhoids   . Family history of adverse reaction to anesthesia 2012   daughter "had OR for crushed hand; had problems w/anesthesia & I was in there all day long" (  . Fatty liver   . Frequent UTI   . GERD (gastroesophageal reflux disease)   . H/O hiatal hernia   . High cholesterol   . Migraine    "rare now" (08/28/2015)  . Pneumonia ~ 2010;  2013; 07/2015  . Rheumatoid arthritis(714.0)    "hands" (08/28/2015)  . Urinary frequency      Patient Active Problem List   Diagnosis Date Noted  . Precordial pain 08/30/2018  . Reactive airway disease with acute exacerbation 01/28/2018  . Left ventricular diastolic dysfunction, NYHA class 2 12/21/2013  . Non-rheumatic aortic sclerosis ([place]) 12/21/2013  . AAA (abdominal aortic aneurysm) ([place]) 12/21/2013  . 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 Chronic atrial fibrillation   . Edema of both legs   . 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 Chronic anticoagulation 05/10/2012  . PVD, moderate bilat carotid disease 05/10/2012  . Normal left ventricular systolic function, 2D 1/47 05/10/2012  . Dyslipidemia 05/10/2012  . Hypertension   . GERD (gastroesophageal reflux disease)   . Bladder cancer ([place])   . Rheumatoid arthritis(714.0)   . Primary bladder malignant neoplasm ([place]) 11/10/2011  . COLONIC POLYPS, ADENOMATOUS 01/26/2008  . HEMORRHOIDS, EXTERNAL 01/26/2008  . GASTRITIS, CHRONIC 01/26/2008  . HIATAL HERNIA 01/26/2008  . DIVERTICULOSIS OF COLON 01/26/2008    Past Surgical History:  Procedure Laterality Date  . BREAST CYST EXCISION Left 1960's?   2 cysts; benign  . CARDIAC CATHETERIZATION  1980's  . CATARACT EXTRACTION W/ INTRAOCULAR LENS  IMPLANT, BILATERAL Bilateral 1992  . CYSTOSCOPY  11/11/2011   Procedure: CYSTOSCOPY;  Surgeon: Malka So;  Location: [facility];  Service: Urology;  Laterality: N/A;  . CYSTOSTOMY W/ BLADDER BIOPSY  10/08/2013  .  INCONTINENCE SURGERY  1980's  . JOINT REPLACEMENT    . KNEE ARTHROSCOPY Right 2005   S/P fall  . Lower Extremity Venous Doppler  11/10/2013   No DVT or superficial thrombus enlarged inguinal lymph node noted in the right. No Baker's cyst.  . TONSILLECTOMY  1952  . TOTAL KNEE ARTHROPLASTY Right 02/05/2013   Procedure: TOTAL KNEE ARTHROPLASTY;  Surgeon: Mauri Pole, MD;  Location: [facility];  Service: Orthopedics;  Laterality: Right;  . TRANSTHORACIC ECHOCARDIOGRAM  01/'15; 4/'19   a) EF 60-65%. NO RWMA. Ao Sclerosis. MAC - no MS  with mild MR>  B Atriae mildly dilated;; b)  EF 55-60%. AoV Sclerosis (no stenosis).  Trivial AI & MR. Mild LAE & Severe RAE.. Mild RV dilation  . TRANSURETHRAL RESECTION OF BLADDER TUMOR  11/11/2011   Procedure: TRANSURETHRAL RESECTION OF BLADDER TUMOR (TURBT);  Surgeon: Malka So;  Location: [facility];  Service: Urology;  Laterality: N/A;  Cysto, Bladder Biopsy, TURBT with Gyrus,   . TRANSURETHRAL RESECTION OF BLADDER TUMOR WITH GYRUS (TURBT-GYRUS)  2007; 2009; 2010   "for tumors on surface of bladder"  . TUMOR EXCISION  1976; 1980's   "fatty tumor cut off her upper back; left thumb"  . VAGINAL HYSTERECTOMY  1960   partial      OB History   None      Home Medications    Prior to Admission medications   Medication Sig Start Date End Date Taking? Authorizing Provider  albuterol (PROVENTIL HFA;VENTOLIN HFA) 108 (90 Base) MCG/ACT inhaler Inhale 2 puffs into the lungs every 6 (six) hours as needed for wheezing or shortness of breath. 10/26/18   Janora Norlander, DO  azithromycin (ZITHROMAX) 250 MG tablet Take 2 the first day and then one each day after. 10/18/18   Dettinger, Fransisca Kaufmann, MD  calcium carbonate (OS-CAL - DOSED IN MG OF ELEMENTAL CALCIUM) 1250 MG tablet Take 1 tablet by mouth every morning.     [provider]  cholecalciferol (VITAMIN D) 1000 UNITS tablet Take 1,000 Units by mouth daily.    [provider]  diltiazem (CARDIZEM CD) 240 MG 24 hr capsule Take 1 capsule (240 mg total) by mouth daily. 09/02/15   [name], MD  furosemide (LASIX) 20 MG tablet Take 1 tablet (20 mg total) by mouth daily. Patient taking differently: Take 20-40 mg by mouth daily. Take one tablet (20MG) by mouth daily except on Mon, Wed, Friday take 2 tablets (40 MG) 09/02/15   [name], MD  Multiple Vitamins-Minerals (MULTIVITAMINS THER. W/MINERALS) TABS Take 1 tablet by mouth daily.     [provider]  Omega-3 Fatty Acids (FISH OIL) 1000 MG CAPS Take 1,000 mg by mouth  daily.    [provider]  omeprazole (PRILOSEC) 20 MG capsule Take 20 mg by mouth 2 (two) times daily.    [provider]  polyethylene glycol (MIRALAX / GLYCOLAX) packet Take 17 g by mouth daily as needed for moderate constipation. 09/02/15   [name], MD  predniSONE (DELTASONE) 20 MG tablet 2 po at same time daily for 5 days 10/18/18   Dettinger, Fransisca Kaufmann, MD  psyllium (METAMUCIL) 58.6 % powder Take 1 packet by mouth 2 (two) times daily.     [provider]  spironolactone (ALDACTONE) 25 MG tablet Take 25 mg by mouth every morning.    [provider]  warfarin (COUMADIN) 3 MG tablet Take 2 tablets (101m) on Mondays, Wednesdays, Fridays, and Saturdays and 1  tablet (69m) on Tuesdays, Thursdays, and Sundays 11/08/18   GJanora Norlander DO    Family History Family History  Problem Relation Age of Onset  . Anesthesia problems Daughter   . Prostate cancer Brother   . Breast cancer Other        neice  . Heart disease Brother   . Colon cancer Neg Hx     Social History Social History   Tobacco Use  . Smoking status: Former Smoker    Packs/day: 1.00    Years: 40.00    Pack years: 40.00    Types: Cigarettes    Last attempt to quit: 11/07/1982    Years since quitting: 36.0  . Smokeless tobacco: Former [employer]ystems developer   Types: Snuff  Substance Use Topics  . Alcohol use: Yes    Alcohol/week: 7.0 standard drinks    Types: 7 Glasses of wine per week    Comment: 1 glass of wine prior to bed  . Drug use: No     Allergies   Contrast media [iodinated diagnostic agents]; Amoxicillin; Ampicillin; Levaquin [levofloxacin]; Penicillins; and Vancomycin   Review of Systems Review of Systems  Constitutional: Positive for appetite change and fatigue.  HENT: Negative for congestion.   Respiratory: Negative for shortness of breath.   Cardiovascular: Negative for chest pain.  Gastrointestinal: Negative for abdominal pain.  Genitourinary: Negative for flank pain.   Musculoskeletal: Negative for back pain.  Skin: Negative for rash.  Neurological: Positive for light-headedness.  Hematological: Negative for adenopathy.  Psychiatric/Behavioral: Negative for confusion.     Physical Exam Updated Vital Signs BP 129/60   Pulse 85   Temp 98.3 F (36.8 C) (Oral)   Resp 17   Ht 5' 5"  (1.651 m)   Wt 72.1 kg   SpO2 99%   BMI 26.45 kg/m   Physical Exam  Constitutional: She is oriented to person, place, and time. She appears well-developed.  HENT:  Head: Normocephalic.  Eyes: EOM are normal.  Neck: Neck supple.  Cardiovascular: Normal rate.  Irregular rhythm.  Pulmonary/Chest: Effort normal. She has no wheezes. She has no rales.  Abdominal: Soft. There is no tenderness.  Musculoskeletal: She exhibits no tenderness.  Neurological: She is alert and oriented to person, place, and time.  Skin: Skin is warm. Capillary refill takes less than 2 seconds.     ED Treatments / Results  Labs (all labs ordered are listed, but only abnormal results are displayed) Labs Reviewed  BASIC METABOLIC PANEL - Abnormal; Notable for the following components:      Result Value   Sodium 133 (*)    Glucose, Bld 103 (*)    All other components within normal limits  URINALYSIS, ROUTINE W REFLEX MICROSCOPIC - Abnormal; Notable for the following components:   Specific Gravity, Urine <1.005 (*)    Hgb urine dipstick MODERATE (*)    Leukocytes, UA TRACE (*)    All other components within normal limits  URINALYSIS, MICROSCOPIC (REFLEX) - Abnormal; Notable for the following components:   Bacteria, UA RARE (*)    All other components within normal limits  CBC  TROPONIN I  CBG MONITORING, ED    EKG EKG Interpretation  Date/Time:  Thursday November 09 2018 11:58:03 EST Ventricular Rate:  95 PR Interval:    QRS Duration: 88 QT Interval:  376 QTC Calculation: 472 R Axis:   12 Text Interpretation:  Atrial fibrillation ST & T wave abnormality, consider  inferolateral ischemia Prolonged QT Abnormal ECG No significant  change since last tracing Confirmed by Davonna Belling [phone]) on 11/09/2018 12:47:15 PM   Radiology Dg Chest 2 View  Result Date: 11/09/2018 CLINICAL DATA:  Cough for 3 weeks. EXAM: CHEST - 2 VIEW COMPARISON:  August 30, 2018 FINDINGS: The cardiomediastinal silhouette is stable. Mild scar atelectasis in the left base, unchanged. No pneumothorax. No nodules or masses. No other acute abnormalities are identified. IMPRESSION: No active cardiopulmonary disease. Electronically Signed   By: Dorise Bullion III M.D   On: 11/09/2018 13:20    Procedures Procedures (including critical care time)  Medications Ordered in ED Medications - No data to display   Initial Impression / Assessment and Plan / ED Course  I have reviewed the triage vital signs and the nursing notes.  Pertinent labs & imaging results that were available during my care of the patient were reviewed by me and considered in my medical decision making (see chart for details).     Patient presents with a near syncopal episode.  Began while cooking some food today.  Has overall been feeling a little weak due to recent pneumonia.  Lab work overall is reassuring.  Feels somewhat better.  Stood up and blood pressure did not drop.  Patient likely has some deconditioning due to recent illness.  Think reasonable for discharge home and outpatient follow-up.  Discharge home.  Final Clinical Impressions(s) / ED Diagnoses   Final diagnoses:  Near syncope    ED Discharge Orders    None       Davonna Belling, MD 11/09/18 1450

## 2018-11-17 ENCOUNTER — Other Ambulatory Visit: Payer: Self-pay | Admitting: Family Medicine

## 2018-12-04 ENCOUNTER — Ambulatory Visit (INDEPENDENT_AMBULATORY_CARE_PROVIDER_SITE_OTHER): Payer: PPO | Admitting: Pharmacist Clinician (PhC)/ Clinical Pharmacy Specialist

## 2018-12-04 DIAGNOSIS — I480 Paroxysmal atrial fibrillation: Secondary | ICD-10-CM

## 2018-12-04 LAB — COAGUCHEK XS/INR WAIVED
INR: 3.6 — ABNORMAL HIGH (ref 0.9–1.1)
Prothrombin Time: 42.6 s

## 2018-12-04 NOTE — Patient Instructions (Signed)
Description   No warfarin today.  Then take 2 tablets on Mondays and Fridays and 1 tablet all other days of the week.  INR today is 3.6 (goal is 2-3)  A little thin today

## 2018-12-20 ENCOUNTER — Ambulatory Visit (INDEPENDENT_AMBULATORY_CARE_PROVIDER_SITE_OTHER): Payer: PPO | Admitting: Pharmacist Clinician (PhC)/ Clinical Pharmacy Specialist

## 2018-12-20 DIAGNOSIS — I480 Paroxysmal atrial fibrillation: Secondary | ICD-10-CM | POA: Diagnosis not present

## 2018-12-20 LAB — COAGUCHEK XS/INR WAIVED
INR: 2 — ABNORMAL HIGH (ref 0.9–1.1)
Prothrombin Time: 24.2 s

## 2018-12-20 MED ORDER — OMEPRAZOLE 20 MG PO CPDR: 20.0000 mg | DELAYED_RELEASE_CAPSULE | Freq: Two times a day (BID) | ORAL | 3 refills | Status: DC

## 2018-12-20 NOTE — Patient Instructions (Signed)
Description   Change to taking 2 tablet (6mg ) on Mondays, Wednesdays, and Fridays and take 1 tablet (3mg ) all other days of the week.  INR today is 2.0 (goal is 2-3)  Perfect reading today

## 2019-01-17 ENCOUNTER — Encounter: Payer: Self-pay | Admitting: Pharmacist Clinician (PhC)/ Clinical Pharmacy Specialist

## 2019-01-17 ENCOUNTER — Ambulatory Visit (INDEPENDENT_AMBULATORY_CARE_PROVIDER_SITE_OTHER): Payer: PPO | Admitting: Pharmacist Clinician (PhC)/ Clinical Pharmacy Specialist

## 2019-01-17 DIAGNOSIS — I480 Paroxysmal atrial fibrillation: Secondary | ICD-10-CM

## 2019-01-17 LAB — COAGUCHEK XS/INR WAIVED
INR: 2.3 — ABNORMAL HIGH (ref 0.9–1.1)
Prothrombin Time: 27.2 s

## 2019-01-17 NOTE — Patient Instructions (Signed)
Description   Continue taking 2 tablets (6mg ) on Mondays and Fridays and take 1 tablet (3mg ) all other days of the week.  INR today is 2.3 (goal is 2-3)  Perfect reading today

## 2019-01-24 ENCOUNTER — Telehealth: Payer: Self-pay | Admitting: Cardiology

## 2019-01-24 NOTE — Telephone Encounter (Signed)
Daughter advised to contact Dr. Julianne Rice office as he has been monitoring p's AAA. Daughter states pt has been doing fairly well since last admission and will keep follow up appointment in April with Dr. Ellyn Hack and will contact pcp office.

## 2019-01-24 NOTE — Telephone Encounter (Signed)
New message:   Patient daughter would like to know will the doctor be checking her aneurism. Please call patient daughter. Patient might need a sooner appt. Patient stomach where the aneurism is she is having pain also patient has been having headaches.

## 2019-01-25 ENCOUNTER — Other Ambulatory Visit: Payer: PPO

## 2019-02-06 ENCOUNTER — Ambulatory Visit (INDEPENDENT_AMBULATORY_CARE_PROVIDER_SITE_OTHER): Payer: PPO | Admitting: Family Medicine

## 2019-02-06 ENCOUNTER — Encounter: Payer: Self-pay | Admitting: Family Medicine

## 2019-02-06 ENCOUNTER — Telehealth: Payer: Self-pay | Admitting: Family Medicine

## 2019-02-06 VITALS — BP 139/74 | HR 82 | Temp 97.6°F | Ht 65.0 in | Wt 160.0 lb

## 2019-02-06 DIAGNOSIS — Z7901 Long term (current) use of anticoagulants: Secondary | ICD-10-CM

## 2019-02-06 DIAGNOSIS — I482 Chronic atrial fibrillation, unspecified: Secondary | ICD-10-CM | POA: Diagnosis not present

## 2019-02-06 DIAGNOSIS — J310 Chronic rhinitis: Secondary | ICD-10-CM | POA: Diagnosis not present

## 2019-02-06 LAB — COAGUCHEK XS/INR WAIVED
INR: 1.9 — ABNORMAL HIGH (ref 0.9–1.1)
Prothrombin Time: 22.4 s

## 2019-02-06 MED ORDER — LORATADINE 10 MG PO TABS: 10.0000 mg | ORAL_TABLET | Freq: Every day | ORAL | 11 refills | Status: DC

## 2019-02-06 NOTE — Patient Instructions (Signed)
Take 2 tablets (6 mg total) of Coumadin on Monday, Wednesdays and Fridays.  Take 1 tablet of Coumadin (3 mg) daily all other days.  Come back in 1 week for recheck of your Coumadin level  Have your daughter call our office with which medications you are taking so that we can update our records.

## 2019-02-06 NOTE — Telephone Encounter (Signed)
furosemide (LASIX) 20 MG tablet atrovastin 10 MG once daily omeprazole (PRILOSEC) 20 MG capsule warfarin (COUMADIN) 3 MG tablet diltiazem (CARDIZEM CD) 240 MG 24 hr capsule   PT daugther was told to call back and let know what rx she is taking   Takes Vitamins and Fish Oil as well.

## 2019-02-06 NOTE — Progress Notes (Signed)
Subjective: CC: INR check, PNA PCP: Janora Norlander, DO ZOX:[id number] [name] is a 85 y.o. female presenting to clinic today for:  1.  Chronic anticoagulation for atrial fibrillation and history of DVT Patient with known chronic atrial fibrillation with chronic anticoagulation on Coumadin.  Goal INR is 2-3.    Patient is previously under the care of Dr. Maudie Mercury, but he has been on medical leave so she is transitioning care to this office.  She was seen about 3 weeks ago and had her INR checked and it was therapeutic at 2.3.  She notes that her dose of Coumadin was reduced to 6 mg on Mondays and Fridays with 3 mg all other days.  She has been compliant with this regimen.  No changes in diet.  She denies any lower extremity edema, shortness of breath, hemoptysis.  She does report a mild cough that onset over the weekend.  It seems to be getting better but she has some persistent drainage.  No fevers.   ROS: Per HPI  Allergies  Allergen Reactions  . Contrast Media [Iodinated Diagnostic Agents] Hives  . Amoxicillin Itching  . Ampicillin Itching  . Levaquin [Levofloxacin] Itching and Rash  . Penicillins Rash    "haven't had it in years"  . Vancomycin Itching and Rash   Past Medical History:  Diagnosis Date  . AAA (abdominal aortic aneurysm) ([place])   . Anxiety   . Bladder cancer ([place]) dx'd 2011   Chronic microscopic hematuria; transitional cell cancer  . Blood transfusion 1960   "related to hysterectomy"  . Chronic atrial fibrillation    Anticoagulated with warfarin, rate control with diltiazem and Toprol  . Chronic back pain   . Chronic headache    "probably 2/wk" (08/28/2015)  . Concussion w/o coma 0/98/1191   Complicated by subarachnoid hemorrhage.  "even now has times when she's not able to comprehend" (05/08/12)  . Coronary artery disease    Nonobstructive  . Diverticulitis   . Diverticulosis   . DVT (deep venous thrombosis), right 2005   "right calf after 8 foot fall"    . Edema of both legs    Chronic, thought to be secondary to DVTs  . External hemorrhoids   . Family history of adverse reaction to anesthesia 2012   daughter "had OR for crushed hand; had problems w/anesthesia & I was in there all day long" (  . Fatty liver   . Frequent UTI   . GERD (gastroesophageal reflux disease)   . H/O hiatal hernia   . High cholesterol   . Migraine    "rare now" (08/28/2015)  . Pneumonia ~ 2010;  2013; 07/2015  . Rheumatoid arthritis(714.0)    "hands" (08/28/2015)  . Urinary frequency     Current Outpatient Medications:  .  calcium carbonate (OS-CAL - DOSED IN MG OF ELEMENTAL CALCIUM) 1250 MG tablet, Take 1 tablet by mouth every morning. , Disp: , Rfl:  .  cholecalciferol (VITAMIN D) 1000 UNITS tablet, Take 1,000 Units by mouth daily., Disp: , Rfl:  .  diltiazem (CARDIZEM CD) 240 MG 24 hr capsule, Take 1 capsule (240 mg total) by mouth daily., Disp: 30 capsule, Rfl: 0 .  furosemide (LASIX) 20 MG tablet, Take 1 tablet (20 mg total) by mouth daily. (Patient taking differently: Take 20-40 mg by mouth daily. Take one tablet (20MG ) by mouth daily except on Mon, Wed, Friday take 2 tablets (40 MG)), Disp: 30 tablet, Rfl: 0 .  Multiple Vitamins-Minerals (MULTIVITAMINS THER.  W/MINERALS) TABS, Take 1 tablet by mouth daily. , Disp: , Rfl:  .  Omega-3 Fatty Acids (FISH OIL) 1000 MG CAPS, Take 1,000 mg by mouth daily., Disp: , Rfl:  .  omeprazole (PRILOSEC) 20 MG capsule, Take 1 capsule (20 mg total) by mouth 2 (two) times daily., Disp: 90 capsule, Rfl: 3 .  polyethylene glycol (MIRALAX / GLYCOLAX) packet, Take 17 g by mouth daily as needed for moderate constipation., Disp: 14 each, Rfl: 0 .  PROAIR HFA 108 (90 Base) MCG/ACT inhaler, TAKE 2 PUFFS BY MOUTH EVERY 6 HOURS AS NEEDED FOR WHEEZE OR SHORTNESS OF BREATH, Disp: 8.5 Inhaler, Rfl: 4 .  psyllium (METAMUCIL) 58.6 % powder, Take 1 packet by mouth 2 (two) times daily. , Disp: , Rfl:  .  spironolactone (ALDACTONE) 25 MG tablet,  Take 25 mg by mouth every morning., Disp: , Rfl:  .  warfarin (COUMADIN) 3 MG tablet, Take 2 tablets (6mg ) on Mondays, Wednesdays, Fridays, and Saturdays and 1 tablet (3mg ) on Tuesdays, Thursdays, and Sundays, Disp: 60 tablet, Rfl: 3 Social History   Socioeconomic History  . Marital status: Widowed    Spouse name: Not on file  . Number of children: 3  . Years of education: Not on file  . Highest education level: 7th grade  Occupational History  . Occupation: Retired  Scientific laboratory technician  . Financial resource strain: Not hard at all  . Food insecurity:    Worry: Never true    Inability: Never true  . Transportation needs:    Medical: No    Non-medical: No  Tobacco Use  . Smoking status: Former Smoker    Packs/day: 1.00    Years: 40.00    Pack years: 40.00    Types: Cigarettes    Last attempt to quit: 11/07/1982    Years since quitting: 36.2  . Smokeless tobacco: Former Systems developer    Types: Snuff  Substance and Sexual Activity  . Alcohol use: Yes    Alcohol/week: 7.0 standard drinks    Types: 7 Glasses of wine per week    Comment: 1 glass of wine prior to bed  . Drug use: No  . Sexual activity: Never  Lifestyle  . Physical activity:    Days per week: 7 days    Minutes per session: 30 min  . Stress: Only a Chambers  Relationships  . Social connections:    Talks on phone: More than three times a week    Gets together: More than three times a week    Attends religious service: More than 4 times per year    Active member of club or organization: No    Attends meetings of clubs or organizations: Never    Relationship status: Widowed  . Intimate partner violence:    Fear of current or ex partner: No    Emotionally abused: No    Physically abused: No    Forced sexual activity: No  Other Topics Concern  . Not on file  Social History Narrative   She is a widowed mother of 55, grandmother of 50, great grandmother of 103. She does not   really get routine exercise. She quit smoking in  1983. She does not smoke and does not use illicit drugs and does not drink.   Family History  Problem Relation Age of Onset  . Anesthesia problems Daughter   . Prostate cancer Brother   . Breast cancer Other        neice  . Heart  disease Brother   . Colon cancer Neg Hx     Objective: Office vital signs reviewed. BP 139/74   Pulse 82   Temp 97.6 F (36.4 C) (Oral)   Ht 5\' 5"  (1.651 m)   Wt 160 lb (72.6 kg)   BMI 26.63 kg/m   Physical Examination:  General: Awake, alert, well nourished, well appearing. No acute distress HEENT: Sclera white.  Mild cobblestoning noted of the oropharynx. Cardio: Irregularly irregular.  Rate controlled.  S1S2 heard, no murmurs appreciated Pulm: Clear to auscultation bilaterally.  Normal work of breathing on room air.  No wheezes, rhonchi or rales.  Assessment/ Plan: 85 y.o. female   1. Chronic atrial fibrillation INR was subtherapeutic at 1.9 today.  I have advised her to increase her Coumadin to 6 mg Monday, Wednesday and Fridays with 3 mg daily all other days.  Plan for recheck INR in 1 week.  2. Chronic anticoagulation As above - CoaguChek XS/INR Waived  3. Rhinitis, unspecified type Likely allergy mediated.  Start Claritin daily.  Rx sent to [facility] per her request. - loratadine (CLARITIN) 10 MG tablet; Take 1 tablet (10 mg total) by mouth daily. For allergy  Dispense: 30 tablet; Refill: 11    Orders Placed This Encounter  Procedures  . CoaguChek XS/INR Waived   Meds ordered this encounter  Medications  . loratadine (CLARITIN) 10 MG tablet    Sig: Take 1 tablet (10 mg total) by mouth daily. For allergy    Dispense:  30 tablet    Refill:  [place], DO [place] [phone]

## 2019-02-12 ENCOUNTER — Ambulatory Visit (INDEPENDENT_AMBULATORY_CARE_PROVIDER_SITE_OTHER): Payer: PPO | Admitting: Nurse Practitioner

## 2019-02-12 ENCOUNTER — Encounter: Payer: Self-pay | Admitting: Nurse Practitioner

## 2019-02-12 VITALS — BP 129/74 | HR 81 | Temp 97.3°F | Ht 65.0 in | Wt 157.0 lb

## 2019-02-12 DIAGNOSIS — J069 Acute upper respiratory infection, unspecified: Secondary | ICD-10-CM

## 2019-02-12 MED ORDER — BENZONATATE 100 MG PO CAPS: 100.0000 mg | ORAL_CAPSULE | Freq: Three times a day (TID) | ORAL | 0 refills | Status: DC | PRN

## 2019-02-12 MED ORDER — DOXYCYCLINE HYCLATE 100 MG PO TABS: 100.0000 mg | ORAL_TABLET | Freq: Two times a day (BID) | ORAL | 0 refills | Status: DC

## 2019-02-12 NOTE — Patient Instructions (Signed)

## 2019-02-12 NOTE — Progress Notes (Signed)
Subjective:    Patient ID: [name], female    DOB: 1921/10/05, 85 y.o.   MRN: [id number]   Chief Complaint: Cough (congestion and tired X 5 days )   HPI Patient comes in today c/o cough and congestion for 5 days. Has been very fatigued and wanted to sleep a lot. She has COPD and every time she gets a cold she winds up in the hospital.     Review of Systems  Constitutional: Negative for chills and fever.  HENT: Positive for congestion, rhinorrhea and sneezing. Negative for ear pain, sore throat and trouble swallowing.   Respiratory: Positive for cough.   Cardiovascular: Negative.   Musculoskeletal: Negative for myalgias.  Neurological: Negative for headaches.  All other systems reviewed and are negative.      Objective:   Physical Exam Vitals signs and nursing note reviewed.  Constitutional:      Appearance: Normal appearance. She is normal weight.  HENT:     Right Ear: Hearing, tympanic membrane, ear canal and external ear normal.     Left Ear: Hearing, tympanic membrane, ear canal and external ear normal.     Nose: Mucosal edema, congestion and rhinorrhea present.     Right Turbinates: Pale.     Left Turbinates: Pale.     Right Sinus: No maxillary sinus tenderness.     Left Sinus: No maxillary sinus tenderness.     Mouth/Throat:     Lips: Pink.     Pharynx: Oropharynx is clear. No posterior oropharyngeal erythema.     Tonsils: Swelling: 0 on the right. 0 on the left.  Neck:     Musculoskeletal: Normal range of motion and neck supple.  Cardiovascular:     Rate and Rhythm: Normal rate and regular rhythm.     Heart sounds: Normal heart sounds.  Pulmonary:     Effort: Pulmonary effort is normal.     Breath sounds: Normal breath sounds.  Lymphadenopathy:     Cervical: No cervical adenopathy.  Skin:    General: Skin is warm and dry.  Neurological:     Mental Status: She is alert and oriented to person, place, and time.  Psychiatric:        Mood and  Affect: Mood normal.        Behavior: Behavior normal.    BP 129/74 (BP Location: Left Arm)   Pulse 81   Temp (!) 97.3 F (36.3 C) (Oral)   Ht 5\' 5"  (1.651 m)   Wt 157 lb (71.2 kg)   BMI 26.13 kg/m       Assessment & Plan:  [name] in today with chief complaint of Cough (congestion and tired X 5 days )   1. Upper respiratory infection with cough and congestion 1. Take meds as prescribed 2. Use a cool mist humidifier especially during the winter months and when heat has been humid. 3. Use saline nose sprays frequently 4. Saline irrigations of the nose can be very helpful if done frequently.  * 4X daily for 1 week*  * Use of a nettie pot can be helpful with this. Follow directions with this* 5. Drink plenty of fluids 6. Keep thermostat turn down low 7.For any cough or congestion  Use plain Mucinex- regular strength or max strength is fine   * Children- consult with Pharmacist for dosing 8. For fever or aces or pains- take tylenol or ibuprofen appropriate for age and weight.  * for fevers greater than 101  orally you may alternate ibuprofen and tylenol every  3 hours.   Meds ordered this encounter  Medications  . doxycycline (VIBRA-TABS) 100 MG tablet    Sig: Take 1 tablet (100 mg total) by mouth 2 (two) times daily. 1 po bid    Dispense:  20 tablet    Refill:  0    Order Specific Question:   Supervising Provider    Answer:   Caryl Pina A [id number]  . benzonatate (TESSALON PERLES) 100 MG capsule    Sig: Take 1 capsule (100 mg total) by mouth 3 (three) times daily as needed for cough.    Dispense:  20 capsule    Refill:  0    Order Specific Question:   Supervising Provider    Answer:   Worthy Rancher [[id number]]   Make sure have coumadin level checked on Wednesday as scheduled.  Mary-Margaret Hassell Done, FNP

## 2019-02-14 ENCOUNTER — Encounter: Payer: Self-pay | Admitting: Family Medicine

## 2019-02-14 ENCOUNTER — Ambulatory Visit (INDEPENDENT_AMBULATORY_CARE_PROVIDER_SITE_OTHER): Payer: PPO | Admitting: Family Medicine

## 2019-02-14 VITALS — BP 138/73 | HR 88 | Temp 97.3°F | Ht 65.0 in | Wt 157.0 lb

## 2019-02-14 DIAGNOSIS — I482 Chronic atrial fibrillation, unspecified: Secondary | ICD-10-CM | POA: Diagnosis not present

## 2019-02-14 DIAGNOSIS — Z7901 Long term (current) use of anticoagulants: Secondary | ICD-10-CM

## 2019-02-14 LAB — COAGUCHEK XS/INR WAIVED
INR: 2.6 — ABNORMAL HIGH (ref 0.9–1.1)
Prothrombin Time: 31.1 s

## 2019-02-14 NOTE — Progress Notes (Signed)
Subjective: CC: INR check, PNA PCP: Janora Norlander, DO LNL:[id number] [name] is a 85 y.o. female presenting to clinic today for:  1.  Chronic anticoagulation for atrial fibrillation and history of DVT Patient with known chronic atrial fibrillation with chronic anticoagulation on Coumadin.  Goal INR is 2-3.    Patient was seen 1 week ago and INR was subtherapeutic at 1.9.  She was instructed to resume use of Coumadin 6 mg on Mondays, Wednesdays and Fridays with 3 mg all other days. Denies any lower extremity edema, shortness of breath, hemoptysis, GI bleed, hematuria or vaginal bleeding. She was placed on Doxycycline 3/2 for respiratory infection.     ROS: Per HPI  Allergies  Allergen Reactions  . Contrast Media [Iodinated Diagnostic Agents] Hives  . Amoxicillin Itching  . Ampicillin Itching  . Levaquin [Levofloxacin] Itching and Rash  . Penicillins Rash    "haven't had it in years"  . Vancomycin Itching and Rash   Past Medical History:  Diagnosis Date  . AAA (abdominal aortic aneurysm) ([place])   . Anxiety   . Bladder cancer ([place]) dx'd 2011   Chronic microscopic hematuria; transitional cell cancer  . Blood transfusion 1960   "related to hysterectomy"  . Chronic atrial fibrillation    Anticoagulated with warfarin, rate control with diltiazem and Toprol  . Chronic back pain   . Chronic headache    "probably 2/wk" (08/28/2015)  . Concussion w/o coma 1/74/0814   Complicated by subarachnoid hemorrhage.  "even now has times when she's not able to comprehend" (05/08/12)  . Coronary artery disease    Nonobstructive  . Diverticulitis   . Diverticulosis   . DVT (deep venous thrombosis), right 2005   "right calf after 8 foot fall"  . Edema of both legs    Chronic, thought to be secondary to DVTs  . External hemorrhoids   . Family history of adverse reaction to anesthesia 2012   daughter "had OR for crushed hand; had problems w/anesthesia & I was in there all day long" (  .  Fatty liver   . Frequent UTI   . GERD (gastroesophageal reflux disease)   . H/O hiatal hernia   . High cholesterol   . Migraine    "rare now" (08/28/2015)  . Pneumonia ~ 2010;  2013; 07/2015  . Rheumatoid arthritis(714.0)    "hands" (08/28/2015)  . Urinary frequency     Current Outpatient Medications:  .  atorvastatin (LIPITOR) 10 MG tablet, Take 10 mg by mouth daily., Disp: , Rfl:  .  benzonatate (TESSALON PERLES) 100 MG capsule, Take 1 capsule (100 mg total) by mouth 3 (three) times daily as needed for cough., Disp: 20 capsule, Rfl: 0 .  calcium carbonate (OS-CAL - DOSED IN MG OF ELEMENTAL CALCIUM) 1250 MG tablet, Take 1 tablet by mouth every morning. , Disp: , Rfl:  .  cholecalciferol (VITAMIN D) 1000 UNITS tablet, Take 1,000 Units by mouth daily., Disp: , Rfl:  .  diltiazem (CARDIZEM CD) 240 MG 24 hr capsule, Take 1 capsule (240 mg total) by mouth daily., Disp: 30 capsule, Rfl: 0 .  doxycycline (VIBRA-TABS) 100 MG tablet, Take 1 tablet (100 mg total) by mouth 2 (two) times daily. 1 po bid, Disp: 20 tablet, Rfl: 0 .  furosemide (LASIX) 20 MG tablet, Take 1 tablet (20 mg total) by mouth daily. (Patient taking differently: Take 20-40 mg by mouth daily. Take one tablet (20MG ) by mouth daily except on Mon, Wed, Friday take 2  tablets (40 MG)), Disp: 30 tablet, Rfl: 0 .  Multiple Vitamins-Minerals (MULTIVITAMINS THER. W/MINERALS) TABS, Take 1 tablet by mouth daily. , Disp: , Rfl:  .  Omega-3 Fatty Acids (FISH OIL) 1000 MG CAPS, Take 1,000 mg by mouth daily., Disp: , Rfl:  .  omeprazole (PRILOSEC) 20 MG capsule, Take 1 capsule (20 mg total) by mouth 2 (two) times daily., Disp: 90 capsule, Rfl: 3 .  PROAIR HFA 108 (90 Base) MCG/ACT inhaler, TAKE 2 PUFFS BY MOUTH EVERY 6 HOURS AS NEEDED FOR WHEEZE OR SHORTNESS OF BREATH, Disp: 8.5 Inhaler, Rfl: 4 .  warfarin (COUMADIN) 3 MG tablet, Take 2 tablets (6mg ) on Mondays, Wednesdays, Fridays, and Saturdays and 1 tablet (3mg ) on Tuesdays, Thursdays, and  Sundays, Disp: 60 tablet, Rfl: 3 Social History   Socioeconomic History  . Marital status: Widowed    Spouse name: Not on file  . Number of children: 3  . Years of education: Not on file  . Highest education level: 7th grade  Occupational History  . Occupation: Retired  Scientific laboratory technician  . Financial resource strain: Not hard at all  . Food insecurity:    Worry: Never true    Inability: Never true  . Transportation needs:    Medical: No    Non-medical: No  Tobacco Use  . Smoking status: Former Smoker    Packs/day: 1.00    Years: 40.00    Pack years: 40.00    Types: Cigarettes    Last attempt to quit: 11/07/1982    Years since quitting: 36.2  . Smokeless tobacco: Former Systems developer    Types: Snuff  Substance and Sexual Activity  . Alcohol use: Yes    Alcohol/week: 7.0 standard drinks    Types: 7 Glasses of wine per week    Comment: 1 glass of wine prior to bed  . Drug use: No  . Sexual activity: Never  Lifestyle  . Physical activity:    Days per week: 7 days    Minutes per session: 30 min  . Stress: Only a little  Relationships  . Social connections:    Talks on phone: More than three times a week    Gets together: More than three times a week    Attends religious service: More than 4 times per year    Active member of club or organization: No    Attends meetings of clubs or organizations: Never    Relationship status: Widowed  . Intimate partner violence:    Fear of current or ex partner: No    Emotionally abused: No    Physically abused: No    Forced sexual activity: No  Other Topics Concern  . Not on file  Social History Narrative   She is a widowed mother of 23, grandmother of 63, great grandmother of 27. She does not   really get routine exercise. She quit smoking in 1983. She does not smoke and does not use illicit drugs and does not drink.   Family History  Problem Relation Age of Onset  . Anesthesia problems Daughter   . Prostate cancer Brother   . Breast  cancer Other        neice  . Heart disease Brother   . Colon cancer Neg Hx     Objective: Office vital signs reviewed. BP 138/73   Pulse 88   Temp (!) 97.3 F (36.3 C) (Oral)   Ht 5\' 5"  (1.651 m)   Wt 157 lb (71.2 kg)  BMI 26.13 kg/m   Physical Examination:  General: Awake, alert, well nourished, well appearing. No acute distress HEENT: Sclera white.  MMM Cardio: Irregularly irregular.  Rate controlled.  S1S2 heard, no murmurs appreciated Pulm: Clear to auscultation bilaterally.  Normal work of breathing on room air.  No wheezes, rhonchi or rales.  Assessment/ Plan: 85 y.o. female   1. Chronic atrial fibrillation Rate controlled.  INR is appropriate at 2.6 today.  Given concomitant use of doxycycline for respiratory infection, I would like to see her back in 1 week for INR recheck.  We discussed that doxycycline can increase the levels of Coumadin.  For now, okay to have some greens with supper to counteract this effect. - CoaguChek XS/INR Waived  2. Chronic anticoagulation Continue Coumadin 6 mg 3 days weekly with 3 mg all other days.   Orders Placed This Encounter  Procedures  . CoaguChek XS/INR Waived   No orders of the defined types were placed in this encounter.    Janora Norlander, DO [place] [phone]

## 2019-02-14 NOTE — Patient Instructions (Signed)
For now, continue 6 mg of warfarin on Mondays Wednesdays and Fridays with 3 mg daily other days.  Your Coumadin level was normal at 2.6 today.  The doxycycline that you are taking for your infection will actually make the Coumadin level higher.  Therefore it is fine if you want to eat a little bit of greens to counteract this effect.  I would like to check your Coumadin level again in 1 week to make sure that we do not need to adjust your dose while you are on the antibiotic.  Metamucil capsules are fine in place of the mix.

## 2019-02-21 ENCOUNTER — Encounter: Payer: Self-pay | Admitting: Family Medicine

## 2019-02-21 ENCOUNTER — Ambulatory Visit (INDEPENDENT_AMBULATORY_CARE_PROVIDER_SITE_OTHER): Payer: PPO | Admitting: Family Medicine

## 2019-02-21 ENCOUNTER — Other Ambulatory Visit: Payer: Self-pay

## 2019-02-21 VITALS — BP 127/61 | HR 87 | Temp 98.2°F | Ht 67.0 in | Wt 160.0 lb

## 2019-02-21 DIAGNOSIS — I10 Essential (primary) hypertension: Secondary | ICD-10-CM

## 2019-02-21 DIAGNOSIS — R791 Abnormal coagulation profile: Secondary | ICD-10-CM

## 2019-02-21 DIAGNOSIS — Z7901 Long term (current) use of anticoagulants: Secondary | ICD-10-CM | POA: Diagnosis not present

## 2019-02-21 DIAGNOSIS — I482 Chronic atrial fibrillation, unspecified: Secondary | ICD-10-CM

## 2019-02-21 LAB — COAGUCHEK XS/INR WAIVED
INR: 3.4 — ABNORMAL HIGH (ref 0.9–1.1)
Prothrombin Time: 41.1 s

## 2019-02-21 NOTE — Progress Notes (Signed)
Subjective: CC: INR check, PNA PCP: Janora Norlander, DO RXV:[id number] E Deignan is a 85 y.o. female presenting to clinic today for:  1.  Chronic anticoagulation for atrial fibrillation and history of DVT Patient with known chronic atrial fibrillation with chronic anticoagulation on Coumadin.  Goal INR is 2-3.    Patient was seen 1 week ago and INR was therapeutic. She was instructed to continue use of Coumadin 6 mg on Mondays, Wednesdays and Fridays with 3 mg all other days.  She is currently being treated with doxycycline for bronchitis.  Denies any hemoptysis, GI bleed, hematuria or vaginal bleeding.   ROS: Per HPI  Allergies  Allergen Reactions  . Contrast Media [Iodinated Diagnostic Agents] Hives  . Amoxicillin Itching  . Ampicillin Itching  . Levaquin [Levofloxacin] Itching and Rash  . Penicillins Rash    "haven't had it in years"  . Vancomycin Itching and Rash   Past Medical History:  Diagnosis Date  . AAA (abdominal aortic aneurysm) ([place])   . Anxiety   . Bladder cancer ([place]) dx'd 2011   Chronic microscopic hematuria; transitional cell cancer  . Blood transfusion 1960   "related to hysterectomy"  . Chronic atrial fibrillation    Anticoagulated with warfarin, rate control with diltiazem and Toprol  . Chronic back pain   . Chronic headache    "probably 2/wk" (08/28/2015)  . Concussion w/o coma 1/95/0932   Complicated by subarachnoid hemorrhage.  "even now has times when she's not able to comprehend" (05/08/12)  . Coronary artery disease    Nonobstructive  . Diverticulitis   . Diverticulosis   . DVT (deep venous thrombosis), right 2005   "right calf after 8 foot fall"  . Edema of both legs    Chronic, thought to be secondary to DVTs  . External hemorrhoids   . Family history of adverse reaction to anesthesia 2012   daughter "had OR for crushed hand; had problems w/anesthesia & I was in there all day long" (  . Fatty liver   . Frequent UTI   . GERD  (gastroesophageal reflux disease)   . H/O hiatal hernia   . High cholesterol   . Migraine    "rare now" (08/28/2015)  . Pneumonia ~ 2010;  2013; 07/2015  . Rheumatoid arthritis(714.0)    "hands" (08/28/2015)  . Urinary frequency     Current Outpatient Medications:  .  atorvastatin (LIPITOR) 10 MG tablet, Take 10 mg by mouth daily., Disp: , Rfl:  .  benzonatate (TESSALON PERLES) 100 MG capsule, Take 1 capsule (100 mg total) by mouth 3 (three) times daily as needed for cough., Disp: 20 capsule, Rfl: 0 .  calcium carbonate (OS-CAL - DOSED IN MG OF ELEMENTAL CALCIUM) 1250 MG tablet, Take 1 tablet by mouth every morning. , Disp: , Rfl:  .  cholecalciferol (VITAMIN D) 1000 UNITS tablet, Take 1,000 Units by mouth daily., Disp: , Rfl:  .  diltiazem (CARDIZEM CD) 240 MG 24 hr capsule, Take 1 capsule (240 mg total) by mouth daily., Disp: 30 capsule, Rfl: 0 .  doxycycline (VIBRA-TABS) 100 MG tablet, Take 1 tablet (100 mg total) by mouth 2 (two) times daily. 1 po bid, Disp: 20 tablet, Rfl: 0 .  furosemide (LASIX) 20 MG tablet, Take 1 tablet (20 mg total) by mouth daily. (Patient taking differently: Take 20-40 mg by mouth daily. Take one tablet (20MG ) by mouth daily except on Mon, Wed, Friday take 2 tablets (40 MG)), Disp: 30 tablet, Rfl: 0 .  Multiple Vitamins-Minerals (MULTIVITAMINS THER. W/MINERALS) TABS, Take 1 tablet by mouth daily. , Disp: , Rfl:  .  Omega-3 Fatty Acids (FISH OIL) 1000 MG CAPS, Take 1,000 mg by mouth daily., Disp: , Rfl:  .  omeprazole (PRILOSEC) 20 MG capsule, Take 1 capsule (20 mg total) by mouth 2 (two) times daily., Disp: 90 capsule, Rfl: 3 .  PROAIR HFA 108 (90 Base) MCG/ACT inhaler, TAKE 2 PUFFS BY MOUTH EVERY 6 HOURS AS NEEDED FOR WHEEZE OR SHORTNESS OF BREATH, Disp: 8.5 Inhaler, Rfl: 4 .  warfarin (COUMADIN) 3 MG tablet, Take 2 tablets (6mg ) on Mondays, Wednesdays, Fridays, and Saturdays and 1 tablet (3mg ) on Tuesdays, Thursdays, and Sundays, Disp: 60 tablet, Rfl: 3 Social  History   Socioeconomic History  . Marital status: Widowed    Spouse name: Not on file  . Number of children: 3  . Years of education: Not on file  . Highest education level: 7th grade  Occupational History  . Occupation: Retired  Scientific laboratory technician  . Financial resource strain: Not hard at all  . Food insecurity:    Worry: Never true    Inability: Never true  . Transportation needs:    Medical: No    Non-medical: No  Tobacco Use  . Smoking status: Former Smoker    Packs/day: 1.00    Years: 40.00    Pack years: 40.00    Types: Cigarettes    Last attempt to quit: 11/07/1982    Years since quitting: 36.3  . Smokeless tobacco: Former Systems developer    Types: Snuff  Substance and Sexual Activity  . Alcohol use: Yes    Alcohol/week: 7.0 standard drinks    Types: 7 Glasses of wine per week    Comment: 1 glass of wine prior to bed  . Drug use: No  . Sexual activity: Never  Lifestyle  . Physical activity:    Days per week: 7 days    Minutes per session: 30 min  . Stress: Only a little  Relationships  . Social connections:    Talks on phone: More than three times a week    Gets together: More than three times a week    Attends religious service: More than 4 times per year    Active member of club or organization: No    Attends meetings of clubs or organizations: Never    Relationship status: Widowed  . Intimate partner violence:    Fear of current or ex partner: No    Emotionally abused: No    Physically abused: No    Forced sexual activity: No  Other Topics Concern  . Not on file  Social History Narrative   She is a widowed mother of 84, grandmother of 4, great grandmother of 66. She does not   really get routine exercise. She quit smoking in 1983. She does not smoke and does not use illicit drugs and does not drink.   Family History  Problem Relation Age of Onset  . Anesthesia problems Daughter   . Prostate cancer Brother   . Breast cancer Other        neice  . Heart disease  Brother   . Colon cancer Neg Hx     Objective: Office vital signs reviewed. BP 127/61   Pulse 87   Temp 98.2 F (36.8 C) (Oral)   Ht 5\' 7"  (1.702 m)   Wt 160 lb (72.6 kg)   BMI 25.06 kg/m   Physical Examination:  General: Awake, alert,  well nourished, well appearing. No acute distress HEENT: Sclera white.  MMM Cardio: Irregularly irregular.  Rate controlled.  S1S2 heard, no murmurs appreciated Pulm: Clear to auscultation bilaterally.  Normal work of breathing on room air.  No wheezes, rhonchi or rales.  Assessment/ Plan: 85 y.o. female   1. Chronic atrial fibrillation Rate controlled.  INR is supratherapeutic today at 3.4.  I suspect this is related to use of doxycycline.  I would like her to skip today and tomorrow's Coumadin dose and she has a couple of days of doxycycline left.  She may resume normal dosing of Coumadin on Friday.  She will return in 1 week for recheck. - CoaguChek XS/INR Waived  2. Chronic anticoagulation As above  3. Supratherapeutic INR - CBC; Future - CoaguChek XS/INR Waived; Future  4. Essential hypertension Controlled.  She wishes to have fasting labs done next week.  These have been ordered - Basic Metabolic Panel; Future    No orders of the defined types were placed in this encounter.  No orders of the defined types were placed in this encounter.    Janora Norlander, DO [place] [phone]

## 2019-02-21 NOTE — Patient Instructions (Signed)
Skip today and tomorrow's coumadin.  On Friday, start taking your coumadin normally again.   See me on Wednesday and we will recheck your level and your other labs.

## 2019-02-28 ENCOUNTER — Other Ambulatory Visit: Payer: Self-pay

## 2019-02-28 ENCOUNTER — Ambulatory Visit (INDEPENDENT_AMBULATORY_CARE_PROVIDER_SITE_OTHER): Payer: PPO | Admitting: Pharmacist Clinician (PhC)/ Clinical Pharmacy Specialist

## 2019-02-28 ENCOUNTER — Encounter: Payer: Self-pay | Admitting: Pharmacist Clinician (PhC)/ Clinical Pharmacy Specialist

## 2019-02-28 DIAGNOSIS — Z7901 Long term (current) use of anticoagulants: Secondary | ICD-10-CM

## 2019-02-28 DIAGNOSIS — I10 Essential (primary) hypertension: Secondary | ICD-10-CM

## 2019-02-28 DIAGNOSIS — I482 Chronic atrial fibrillation, unspecified: Secondary | ICD-10-CM

## 2019-02-28 DIAGNOSIS — R791 Abnormal coagulation profile: Secondary | ICD-10-CM

## 2019-02-28 LAB — COAGUCHEK XS/INR WAIVED
INR: 1.8 — ABNORMAL HIGH (ref 0.9–1.1)
Prothrombin Time: 21.6 s

## 2019-02-28 NOTE — Patient Instructions (Signed)
Description   Continue taking 2 tablets (6mg ) on Mondays and Fridays and take 1 tablet (3mg ) all other days of the week.  INR today is 1.8  (goal is 2-3)  Just little thick today

## 2019-03-01 LAB — BASIC METABOLIC PANEL
BUN / CREAT RATIO: 14 (ref 12–28)
BUN: 9 mg/dL — ABNORMAL LOW (ref 10–36)
CO2: 24 mmol/L (ref 20–29)
Calcium: 9.2 mg/dL (ref 8.7–10.3)
Chloride: 99 mmol/L (ref 96–106)
Creatinine, Ser: 0.65 mg/dL (ref 0.57–1.00)
GFR calc Af Amer: 87 mL/min/{1.73_m2} (ref >59)
GFR calc non Af Amer: 76 mL/min/{1.73_m2} (ref >59)
Glucose: 99 mg/dL (ref 65–99)
Potassium: 4.2 mmol/L (ref 3.5–5.2)
Sodium: 141 mmol/L (ref 134–144)

## 2019-03-01 LAB — CBC
Hematocrit: 39.5 % (ref 34.0–46.6)
Hemoglobin: 13.6 g/dL (ref 11.1–15.9)
MCH: 32.6 pg (ref 26.6–33.0)
MCHC: 34.4 g/dL (ref 31.5–35.7)
MCV: 95 fL (ref 79–97)
PLATELETS: 228 10*3/uL (ref 150–450)
RBC: 4.17 x10E6/uL (ref 3.77–5.28)
RDW: 12.2 % (ref 11.7–15.4)
WBC: 5.2 10*3/uL (ref 3.4–10.8)

## 2019-03-07 ENCOUNTER — Telehealth: Payer: Self-pay | Admitting: Family Medicine

## 2019-03-07 NOTE — Telephone Encounter (Signed)
Spoke to patient.  There was question about her Coumadin, which she had been taking 6mg  MWF of w/ 3mg  all other days.  Her instructions noted only 6mg  TWO days per week with 3 all other days.  She wanted to make sure it was ok to go back to previous Coumadin dosing.  She had subtherapeutic INR and should not resume previous 6mg  MWF w/ 3mg  all other days since she is off the antibiotic.   M. Lajuana Ripple, [facility]

## 2019-03-27 ENCOUNTER — Telehealth: Payer: Self-pay | Admitting: *Deleted

## 2019-03-27 NOTE — Telephone Encounter (Signed)
   TELEPHONE CALL NOTE  This patient has been deemed a candidate for follow-up tele-health visit to limit community exposure during the Covid-19 pandemic. I spoke with the patient via phone to discuss instructions. This has been outlined on the patient's AVS (dotphrase: [email or web address]). The patient was advised to review the section on consent for treatment as well. The patient will receive a phone call 2-3 days prior to their E-Visit at which time consent will be verbally confirmed. A Virtual Office Visit appointment type has been scheduled for 4/20 at 2:20 pm with dr harding , - patient prefers telephone  type.    Raiford Simmonds, RN 03/27/2019 4:45 PM

## 2019-03-28 ENCOUNTER — Telehealth: Payer: Self-pay | Admitting: Cardiology

## 2019-03-28 NOTE — Telephone Encounter (Signed)
RN AWARE

## 2019-03-28 NOTE — Telephone Encounter (Signed)
Spoke to patient and her daughter, [name], who confirmed all demographics. Received verbal consent for virtual video visit.

## 2019-03-29 ENCOUNTER — Encounter: Payer: Self-pay | Admitting: *Deleted

## 2019-03-30 ENCOUNTER — Telehealth: Payer: Self-pay | Admitting: Cardiology

## 2019-03-30 NOTE — Telephone Encounter (Signed)
Smart phone/Declined MyChart/pre reg completed. 03-30-19 Patient consents to phone visit. 03-30-19 ST

## 2019-04-02 ENCOUNTER — Encounter: Payer: Self-pay | Admitting: Cardiology

## 2019-04-02 ENCOUNTER — Telehealth (INDEPENDENT_AMBULATORY_CARE_PROVIDER_SITE_OTHER): Payer: PPO | Admitting: Cardiology

## 2019-04-02 VITALS — BP 120/40 | HR 64 | Wt 156.0 lb

## 2019-04-02 DIAGNOSIS — I5189 Other ill-defined heart diseases: Secondary | ICD-10-CM

## 2019-04-02 DIAGNOSIS — I714 Abdominal aortic aneurysm, without rupture, unspecified: Secondary | ICD-10-CM

## 2019-04-02 DIAGNOSIS — I519 Heart disease, unspecified: Secondary | ICD-10-CM

## 2019-04-02 DIAGNOSIS — I482 Chronic atrial fibrillation, unspecified: Secondary | ICD-10-CM

## 2019-04-02 DIAGNOSIS — E785 Hyperlipidemia, unspecified: Secondary | ICD-10-CM

## 2019-04-02 DIAGNOSIS — I10 Essential (primary) hypertension: Secondary | ICD-10-CM

## 2019-04-02 DIAGNOSIS — Z7901 Long term (current) use of anticoagulants: Secondary | ICD-10-CM

## 2019-04-02 DIAGNOSIS — R600 Localized edema: Secondary | ICD-10-CM

## 2019-04-02 NOTE — Assessment & Plan Note (Signed)
Well-controlled blood pressure on current dose of diltiazem.  No change

## 2019-04-02 NOTE — Progress Notes (Signed)
Virtual Visit via Video Note   This visit type was conducted due to national recommendations for restrictions regarding the COVID-19 Pandemic (e.g. social distancing) in an effort to limit this patient's exposure and mitigate transmission in our community.  Due to her co-morbid illnesses, this patient is at least at moderate risk for complications without adequate follow up.  This format is felt to be most appropriate for this patient at this time.  All issues noted in this document were discussed and addressed.  A limited physical exam was performed with this format.  Please refer to the patient's chart for her consent to telehealth for [facility].   Patient has given verbal permission to conduct this visit via virtual appointment and to bill insurance 03/30/2019 10:05 AM     Evaluation Performed:  Follow-up visit  Date:  04/02/2019   ID:  [name], DOB 08/25/1921, MRN [id number]  Patient Location: Home -with daughter Provider Location: Office  PCP:  Adriana Norlander, DO (Dr. Maudie Mercury currently out b/c Colon Cancer). Cardiologist:  Glenetta Hew, MD  Electrophysiologist:  None   Chief Complaint: 58-monthfollow-up  History of Present Illness:    Adriana STENNERis a 85y.o. female with PMH notable for chronic/persistent atrial fibrillation (rate controlled without diltiazem, on warfarin for anticoagulation) with HFpEF (on low-dose Lasix) patient's mobile phone [place] called the home phone to make sure the call but otherwise all of a sudden all color there there who presents via audio/video conferencing for a telehealth visit today as a six-month follow-up.  Adriana BRECHEISENwas last seen in October 2019 as a hospital follow-up.  She had chest pain ruled out for MI.  Had possible TIA symptoms with a negative MRI.  Was feeling much better at the follow-up.  She did not have chest pain according to her recollection.  She had arm pain leading to ER visit.  She still  noted little exertional dyspnea if walking too much but no other real main symptoms.  No sensation of A. fib.  Interval History:  DJennessyis recovering from a URI that was ~ 1 month pre-COVID.  Just a little cough still left.   Every once & a while feels palpitations ("skipping a little") - not really fast.   A chair maybe gets short of breath if she overdoes some exertion.  But not with routine activity.  Got concerned about near syncope back in Nov (Thanksgiving) -- BP down into 90s. Was pale.  Sent home w/o much workup. Otherwise stable.   Cardiovascular ROS: no chest pain or dyspnea on exertion positive for - edema and Every once & a while feels palpitations ("skipping a little") - not really fast.  Got concerned about near syncope back in Nov (Thanksgiving) -- BP down into 90s. Was pale.  Sent home w/o much workup.  -- may have had more rapid Afib.  negative for - chest pain, loss of consciousness, orthopnea, paroxysmal nocturnal dyspnea, rapid heart rate or shortness of breath; syncope/near syncope, TIA /amaurosis fugax;   -- edema is stable R>>L  The patient does not have symptoms concerning for COVID-19 infection (fever, chills, cough, or new shortness of breath).  The patient is practicing social distancing.  They have friends to go to the grocery store for them and leave the food or cook meals on the front doorstep.  ROS:  Please see the history of present illness.   Was sick with URI before COVID  Review  of Systems  Constitutional: Negative for chills, fever and malaise/fatigue.  HENT: Negative for congestion.   Respiratory: Positive for cough (recovering from URI - ~2 months ago).   Cardiovascular: Negative for claudication and leg swelling (R Leg stays swollen during the day; Minimal L leg.).  Gastrointestinal: Positive for abdominal pain (VEntral Hernia) and constipation. Negative for blood in stool, melena, nausea and vomiting.  Genitourinary: Negative for dysuria (off & on -  but not recently-- Bladder Ca f/u delayed) and hematuria.  Musculoskeletal: Positive for back pain (really limits activity) and joint pain.  Neurological: Negative for dizziness.  Psychiatric/Behavioral: Negative for memory loss. The patient is not nervous/anxious and does not have insomnia.   All other systems reviewed and are negative.   Back pain, joint pain.  Off and on hand tingling.  Right knee pain.  Past Medical History:  Diagnosis Date  . AAA (abdominal aortic aneurysm) ([place])   . Anxiety   . Bladder cancer ([place]) dx'd 2011   Chronic microscopic hematuria; transitional cell cancer  . Blood transfusion 1960   "related to hysterectomy"  . Chronic atrial fibrillation    Anticoagulated with warfarin, rate control with diltiazem and Toprol  . Chronic back pain   . Chronic headache    "probably 2/wk" (08/28/2015)  . Concussion w/o coma 04/13/7740   Complicated by subarachnoid hemorrhage.  "even now has times when she's not able to comprehend" (05/08/12)  . Coronary artery disease    Nonobstructive  . Diverticulitis   . Diverticulosis   . DVT (deep venous thrombosis), right 2005   "right calf after 8 foot fall"  . Edema of both legs    Chronic, thought to be secondary to DVTs  . External hemorrhoids   . Family history of adverse reaction to anesthesia 2012   daughter "had OR for crushed hand; had problems w/anesthesia & I was in there all day long" (  . Fatty liver   . Frequent UTI   . GERD (gastroesophageal reflux disease)   . H/O hiatal hernia   . High cholesterol   . Migraine    "rare now" (08/28/2015)  . Pneumonia ~ 2010;  2013; 07/2015  . Rheumatoid arthritis(714.0)    "hands" (08/28/2015)  . Urinary frequency    Past Surgical History:  Procedure Laterality Date  . BREAST CYST EXCISION Left 1960's?   2 cysts; benign  . CARDIAC CATHETERIZATION  1980's  . CATARACT EXTRACTION W/ INTRAOCULAR LENS  IMPLANT, BILATERAL Bilateral 1992  . CYSTOSCOPY  11/11/2011   Procedure:  CYSTOSCOPY;  Surgeon: Malka So;  Location: [facility];  Service: Urology;  Laterality: N/A;  . CYSTOSTOMY W/ BLADDER BIOPSY  10/08/2013  . INCONTINENCE SURGERY  1980's  . JOINT REPLACEMENT    . KNEE ARTHROSCOPY Right 2005   S/P fall  . Lower Extremity Venous Doppler  11/10/2013   No DVT or superficial thrombus enlarged inguinal lymph node noted in the right. No Baker's cyst.  . TONSILLECTOMY  1952  . TOTAL KNEE ARTHROPLASTY Right 02/05/2013   Procedure: TOTAL KNEE ARTHROPLASTY;  Surgeon: Mauri Pole, MD;  Location: [facility];  Service: Orthopedics;  Laterality: Right;  . TRANSTHORACIC ECHOCARDIOGRAM  01/'15; 4/'19   a) EF 60-65%. NO RWMA. Ao Sclerosis. MAC - no MS with mild MR>  B Atriae mildly dilated;; b)  EF 55-60%. AoV Sclerosis (no stenosis).  Trivial AI & MR. Mild LAE & Severe RAE.. Mild RV dilation  . TRANSURETHRAL RESECTION OF BLADDER TUMOR  11/11/2011  Procedure: TRANSURETHRAL RESECTION OF BLADDER TUMOR (TURBT);  Surgeon: Malka So;  Location: [facility];  Service: Urology;  Laterality: N/A;  Cysto, Bladder Biopsy, TURBT with Gyrus,   . TRANSURETHRAL RESECTION OF BLADDER TUMOR WITH GYRUS (TURBT-GYRUS)  2007; 2009; 2010   "for tumors on surface of bladder"  . TUMOR EXCISION  1976; 1980's   "fatty tumor cut off her upper back; left thumb"  . VAGINAL HYSTERECTOMY  1960   partial      Current Meds  Medication Sig  . atorvastatin (LIPITOR) 10 MG tablet Take 10 mg by mouth daily.  . benzonatate (TESSALON PERLES) 100 MG capsule Take 1 capsule (100 mg total) by mouth 3 (three) times daily as needed for cough.  . calcium carbonate (OS-CAL - DOSED IN MG OF ELEMENTAL CALCIUM) 1250 MG tablet Take 1 tablet by mouth every morning.   . diltiazem (CARDIZEM CD) 240 MG 24 hr capsule Take 1 capsule (240 mg total) by mouth daily.  Marland Kitchen doxycycline (VIBRA-TABS) 100 MG tablet Take 1 tablet (100 mg total) by mouth 2 (two) times daily. 1 po bid  . furosemide (LASIX) 20 MG tablet Take 1 tablet (20 mg  total) by mouth daily. (Patient taking differently: Take 20-40 mg by mouth daily. Take one tablet (20MG) by mouth daily except on Mon, Wed, Friday take 2 tablets (40 MG))  . Multiple Vitamins-Minerals (MULTIVITAMINS THER. W/MINERALS) TABS Take 1 tablet by mouth daily.   . Omega-3 Fatty Acids (FISH OIL) 1000 MG CAPS Take 1,000 mg by mouth daily.  Marland Kitchen omeprazole (PRILOSEC) 20 MG capsule Take 1 capsule (20 mg total) by mouth 2 (two) times daily.  Marland Kitchen PROAIR HFA 108 (90 Base) MCG/ACT inhaler TAKE 2 PUFFS BY MOUTH EVERY 6 HOURS AS NEEDED FOR WHEEZE OR SHORTNESS OF BREATH  . warfarin (COUMADIN) 3 MG tablet Take 2 tablets (42m) on Mondays, Wednesdays, Fridays, and Saturdays and 1 tablet (359m on Tuesdays, Thursdays, and Sundays     Allergies:   Contrast media [iodinated diagnostic agents]; Amoxicillin; Ampicillin; Levaquin [levofloxacin]; Penicillins; and Vancomycin   Social History   Tobacco Use  . Smoking status: Former Smoker    Packs/day: 1.00    Years: 40.00    Pack years: 40.00    Types: Cigarettes    Last attempt to quit: 11/07/1982    Years since quitting: 36.4  . Smokeless tobacco: Former [employer] developer  Types: Snuff  Substance Use Topics  . Alcohol use: Yes    Alcohol/week: 7.0 standard drinks    Types: 7 Glasses of wine per week    Comment: 1 glass of wine prior to bed  . Drug use: No     Family Hx: The patient's family history includes Anesthesia problems in her daughter; Breast cancer in an other family member; Heart disease in her brother; Prostate cancer in her brother. There is no history of Colon cancer.   Prior CV studies:   The following studies were reviewed today: . none  Labs/Other Tests and Data Reviewed:    EKG:  No ECG reviewed.  Recent Labs: 02/28/2019: BUN 9; Creatinine, Ser 0.65; Hemoglobin 13.6; Platelets 228; Potassium 4.2; Sodium 141   Recent Lipid Panel No results found for: CHOL, TRIG, HDL, CHOLHDL, LDLCALC, LDLDIRECT  Wt Readings from Last 3 Encounters:   04/02/19 156 lb (70.8 kg)  02/21/19 160 lb (72.6 kg)  02/14/19 157 lb (71.2 kg)     Objective:    Vital Signs:  BP (!) 120/40   Pulse  64   Wt 156 lb (70.8 kg)   BMI 24.43 kg/m   VITAL SIGNS:  reviewed GEN:  no acute distress RESPIRATORY:  normal respiratory effort, symmetric expansion NEURO:  alert and oriented x 3, no obvious focal deficit PSYCH:  normal affect   ASSESSMENT & PLAN:    Problem List Items Addressed This Visit    AAA (abdominal aortic aneurysm) ([place]) - Primary (Chronic)    Borderline findings in 2018.  Plan is to reevaluate this year.  If stable, I would then probably not follow-up anymore.  She is anxious about this and so therefore we will check abdominal aortic ultrasound prior to our next visit in 6 months      Relevant Orders   VAS [place] AAA DUPLEX   Chronic anticoagulation (Chronic)    Doing well on warfarin.  Was temporarily held while she was on antibiotics for URI.  Now should be back on.  Due to have labs checked by PCP next week. This patients CHA2DS2-VASc Score and unadjusted Ischemic Stroke Rate (% per year) is equal to 7.2 % stroke rate/year from a score of 5  Above score calculated as 1 point each if present [CHF, HTN, DM, Vascular=MI/PAD/Aortic Plaque, Age if 65-74, or Female] Above score calculated as 2 points each if present [Age > 75, or Stroke/TIA/TE]       Chronic atrial fibrillation (Chronic)    Pretty much asymptomatic besides some mild chronotropic incompetence and exertional dyspnea. On stable dose of diltiazem warfarin.  No changes for now.      Dyslipidemia (Chronic)    On low-dose statin.  Labs followed by PCP.  Were well controlled as of September 2019      Edema of both legs (Chronic)   Essential hypertension (Chronic)    Well-controlled blood pressure on current dose of diltiazem.  No change      Left ventricular diastolic dysfunction, NYHA class 2 (Chronic)    She probably does have some diastolic dysfunction  exacerbated if she has rapid A. fib, but not really having any PND orthopnea symptoms.  Her edema is stable and well-controlled with her dose of Lasix.--She takes 40 mg on Monday, Wednesday and Friday and then 20 mg on Tuesday, Thursday, Saturday and Sunday         COVID-19 Education: The signs and symptoms of COVID-19 were discussed with the patient and how to seek care for testing (follow up with PCP or arrange E-visit).   The importance of social distancing was discussed today.  Time:   Today, I have spent 28 minutes with the patient with telehealth technology discussing the above problems.     Medication Adjustments/Labs and Tests Ordered: Current medicines are reviewed at length with the patient today.  Concerns regarding medicines are outlined above.  Medication Instructions:  No changes  Tests Ordered: No orders of the defined types were placed in this encounter. - Abd [place] for AAA in Aug/Sept   Medication Changes: No orders of the defined types were placed in this encounter.   Disposition:  Follow up in 6 month(s)    Signed, Glenetta Hew, MD  04/02/2019 5:21 PM    [place]

## 2019-04-02 NOTE — Patient Instructions (Signed)
Medication Instructions:  NO CHANGE If you need a refill on your cardiac medications before your next appointment, please call your pharmacy.   Lab work: If you have labs (blood work) drawn today and your tests are completely normal, you will receive your results only by: Marland Kitchen MyChart Message (if you have MyChart) OR . A paper copy in the mail If you have any lab test that is abnormal or we need to change your treatment, we will call you to review the results.  Testing/Procedures: Your physician has requested that you have an abdominal aorta duplex. During this test, an ultrasound is used to evaluate the aorta. Allow 30 minutes for this exam. Do not eat after midnight the day before and avoid carbonated beverages   Follow-Up: At [facility], you and your health needs are our priority.  As part of our continuing mission to provide you with exceptional heart care, we have created designated Provider Care Teams.  These Care Teams include your primary Cardiologist (physician) and Advanced Practice Providers (APPs -  Physician Assistants and Nurse Practitioners) who all work together to provide you with the care you need, when you need it. You will need a follow up appointment in 6 months.  Please call our office 2 months in advance to schedule this appointment.  You may see Glenetta Hew, MD or one of the following Advanced Practice Providers on your designated Care Team:   Rosaria Ferries, PA-C . Jory Sims, DNP, ANP

## 2019-04-02 NOTE — Assessment & Plan Note (Signed)
On low-dose statin.  Labs followed by PCP.  Were well controlled as of September 2019

## 2019-04-02 NOTE — Assessment & Plan Note (Signed)
Borderline findings in 2018.  Plan is to reevaluate this year.  If stable, I would then probably not follow-up anymore.  She is anxious about this and so therefore we will check abdominal aortic ultrasound prior to our next visit in 6 months

## 2019-04-02 NOTE — Assessment & Plan Note (Signed)
She probably does have some diastolic dysfunction exacerbated if she has rapid A. fib, but not really having any PND orthopnea symptoms.  Her edema is stable and well-controlled with her dose of Lasix.--She takes 40 mg on Monday, Wednesday and Friday and then 20 mg on Tuesday, Thursday, Saturday and Sunday

## 2019-04-02 NOTE — Assessment & Plan Note (Signed)
Pretty much asymptomatic besides some mild chronotropic incompetence and exertional dyspnea. On stable dose of diltiazem warfarin.  No changes for now.

## 2019-04-02 NOTE — Assessment & Plan Note (Addendum)
Doing well on warfarin.  Was temporarily held while she was on antibiotics for URI.  Now should be back on.  Due to have labs checked by PCP next week. This patients CHA2DS2-VASc Score and unadjusted Ischemic Stroke Rate (% per year) is equal to 7.2 % stroke rate/year from a score of 5  Above score calculated as 1 point each if present [CHF, HTN, DM, Vascular=MI/PAD/Aortic Plaque, Age if 65-74, or Female] Above score calculated as 2 points each if present [Age > 75, or Stroke/TIA/TE]

## 2019-04-03 ENCOUNTER — Other Ambulatory Visit: Payer: Self-pay

## 2019-04-04 ENCOUNTER — Encounter: Payer: Self-pay | Admitting: Pharmacist Clinician (PhC)/ Clinical Pharmacy Specialist

## 2019-04-04 ENCOUNTER — Ambulatory Visit (INDEPENDENT_AMBULATORY_CARE_PROVIDER_SITE_OTHER): Payer: PPO | Admitting: Pharmacist Clinician (PhC)/ Clinical Pharmacy Specialist

## 2019-04-04 DIAGNOSIS — I480 Paroxysmal atrial fibrillation: Secondary | ICD-10-CM

## 2019-04-04 LAB — COAGUCHEK XS/INR WAIVED
INR: 2.5 — ABNORMAL HIGH (ref 0.9–1.1)
Prothrombin Time: 30.2 s

## 2019-04-04 NOTE — Patient Instructions (Addendum)
Description   Continue taking 2 tablets (6mg ) on Monday, Wednesdays and Fridays and take 1 tablet (3mg ) all other days of the week.  INR today is 2.5  (goal is 2-3)  Perfect reading today

## 2019-05-06 ENCOUNTER — Other Ambulatory Visit: Payer: Self-pay | Admitting: Pharmacist Clinician (PhC)/ Clinical Pharmacy Specialist

## 2019-05-11 ENCOUNTER — Ambulatory Visit (INDEPENDENT_AMBULATORY_CARE_PROVIDER_SITE_OTHER): Payer: PPO | Admitting: *Deleted

## 2019-05-11 ENCOUNTER — Other Ambulatory Visit: Payer: Self-pay

## 2019-05-11 DIAGNOSIS — Z0000 Encounter for general adult medical examination without abnormal findings: Secondary | ICD-10-CM | POA: Diagnosis not present

## 2019-05-11 NOTE — Progress Notes (Signed)
MEDICARE ANNUAL WELLNESS VISIT  05/11/2019  Telephone Visit Disclaimer This Medicare AWV was conducted by telephone due to national recommendations for restrictions regarding the COVID-19 Pandemic (e.g. social distancing).  I verified, using two identifiers, that I am speaking with [name] or their authorized healthcare agent. I discussed the limitations, risks, security, and privacy concerns of performing an evaluation and management service by telephone and the potential availability of an in-person appointment in the future. The patient expressed understanding and agreed to proceed.   Subjective:  [name] is a 85 y.o. female patient of Janora Norlander, DO who had a Medicare Annual Wellness Visit today via telephone. [name] is Retired and lives with their daughter. she has 3 children. she reports that she is socially active and does interact with friends/family regularly. she is minimally physically active and enjoys scrolling through social media, coloring and doing crossword puzzles.  Patient Care Team: Janora Norlander, DO as PCP - General (Family Medicine) Leonie Man, MD as PCP - Cardiology (Cardiology)  Advanced Directives 05/11/2019 11/09/2018 08/30/2018 08/30/2018 01/29/2018 01/28/2018 10/01/2015  Does Patient Have a Medical Advance Directive? Yes Yes Yes Yes No No;Yes -  Type of Paramedic of [place];Living will [place];Living will [place];Living will [place];Living will - [place];Living will Living will;Healthcare Power of Attorney  Does patient want to make changes to medical advance directive? No - Patient declined - No - Patient declined - - - -  Copy of [place] in Chart? No - copy requested - - - No - copy requested No - copy requested -  Would patient like information on creating a medical advance directive? - - -  - - No - Patient declined -  Pre-existing out of facility DNR order (yellow form or pink MOST form) - - - - - - -    Hospital Utilization Over the Past 12 Months: # of hospitalizations or ER visits: 2 # of surgeries: 0  Review of Systems    Patient reports that her overall health is unchanged compared to last year.  Patient Reported Readings (BP, Pulse, CBG, Weight, etc) none  Review of Systems: No complaints  All other systems negative.  Pain Assessment Pain : No/denies pain     Current Medications & Allergies (verified) Allergies as of 05/11/2019      Reactions   Contrast Media [iodinated Diagnostic Agents] Hives   Amoxicillin Itching   Ampicillin Itching   Levaquin [levofloxacin] Itching, Rash   Penicillins Rash   "haven't had it in years"   Vancomycin Itching, Rash      Medication List       Accurate as of May 11, 2019  2:05 PM. If you have any questions, ask your nurse or doctor.        STOP taking these medications   doxycycline 100 MG tablet Commonly known as:  VIBRA-TABS     TAKE these medications   atorvastatin 10 MG tablet Commonly known as:  LIPITOR Take 10 mg by mouth daily.   benzonatate 100 MG capsule Commonly known as:  Tessalon Perles Take 1 capsule (100 mg total) by mouth 3 (three) times daily as needed for cough.   calcium carbonate 1250 (500 Ca) MG tablet Commonly known as:  OS-CAL - dosed in mg of elemental calcium Take 1 tablet by mouth every morning.   cholecalciferol 1000 units tablet Commonly known  as:  VITAMIN D Take 1,000 Units by mouth daily.   diltiazem 240 MG 24 hr capsule Commonly known as:  CARDIZEM CD Take 1 capsule (240 mg total) by mouth daily.   Fish Oil 1000 MG Caps Take 1,000 mg by mouth daily.   furosemide 20 MG tablet Commonly known as:  LASIX Take 1 tablet (20 mg total) by mouth daily. What changed:    how much to take  additional instructions   loratadine 10 MG tablet Commonly known as:  CLARITIN  TAKE 1 TABLET BY MOUTH ONCE DAILY FOR ALLERGY   multivitamins ther. w/minerals Tabs tablet Take 1 tablet by mouth daily.   omeprazole 20 MG capsule Commonly known as:  PRILOSEC TAKE 1 CAPSULE BY MOUTH TWICE A DAY   ProAir HFA 108 (90 Base) MCG/ACT inhaler Generic drug:  albuterol TAKE 2 PUFFS BY MOUTH EVERY 6 HOURS AS NEEDED FOR WHEEZE OR SHORTNESS OF BREATH   warfarin 3 MG tablet Commonly known as:  COUMADIN Take as directed by the anticoagulation clinic. If you are unsure how to take this medication, talk to your nurse or doctor. Original instructions:  Take 2 tablets (17m) on Mondays, Wednesdays, Fridays, and Saturdays and 1 tablet (321m on Tuesdays, Thursdays, and Sundays       History (reviewed): Past Medical History:  Diagnosis Date  . AAA (abdominal aortic aneurysm) ([facility]  . Allergy   . Anxiety   . Bladder cancer [place] [place]   Chronic microscopic hematuria; transitional cell cancer  . Blood transfusion 1960   "related to hysterectomy"  . Chronic atrial fibrillation    Anticoagulated with warfarin, rate control with diltiazem and Toprol  . Chronic back pain   . Chronic headache    "probably 2/wk" (08/28/2015)  . Concussion w/o coma 03/19/41/5956 Complicated by subarachnoid hemorrhage.  "even now has times when she's not able to comprehend" (05/08/12)  . Coronary artery disease    Nonobstructive  . Diverticulitis   . Diverticulosis   . DVT (deep venous thrombosis), right 2005   "right calf after 8 foot fall"  . Edema of both legs    Chronic, thought to be secondary to DVTs  . External hemorrhoids   . Family history of adverse reaction to anesthesia 2012   daughter "had OR for crushed hand; had problems w/anesthesia & I was in there all day long" (  . Fatty liver   . Frequent UTI   . GERD (gastroesophageal reflux disease)   . H/O hiatal hernia   . High cholesterol   . Migraine    "rare now" (08/28/2015)  . Pneumonia ~ 2010;  2013; 07/2015  . Rheumatoid  arthritis(714.0)    "hands" (08/28/2015)  . Urinary frequency    Past Surgical History:  Procedure Laterality Date  . BREAST CYST EXCISION Left 1960's?   2 cysts; benign  . CARDIAC CATHETERIZATION  1980's  . CATARACT EXTRACTION W/ INTRAOCULAR LENS  IMPLANT, BILATERAL Bilateral 1992  . CYSTOSCOPY  11/11/2011   Procedure: CYSTOSCOPY;  Surgeon: JoMalka So Location: [facility];  Service: Urology;  Laterality: N/A;  . CYSTOSTOMY W/ BLADDER BIOPSY  10/08/2013  . INCONTINENCE SURGERY  1980's  . JOINT REPLACEMENT    . KNEE ARTHROSCOPY Right 2005   S/P fall  . Lower Extremity Venous Doppler  11/10/2013   No DVT or superficial thrombus enlarged inguinal lymph node noted in the right. No Baker's cyst.  . TONSILLECTOMY  1952  . TOTAL KNEE ARTHROPLASTY  Right 02/05/2013   Procedure: TOTAL KNEE ARTHROPLASTY;  Surgeon: Mauri Pole, MD;  Location: [facility];  Service: Orthopedics;  Laterality: Right;  . TRANSTHORACIC ECHOCARDIOGRAM  01/'15; 4/'19   a) EF 60-65%. NO RWMA. Ao Sclerosis. MAC - no MS with mild MR>  B Atriae mildly dilated;; b)  EF 55-60%. AoV Sclerosis (no stenosis).  Trivial AI & MR. Mild LAE & Severe RAE.. Mild RV dilation  . TRANSURETHRAL RESECTION OF BLADDER TUMOR  11/11/2011   Procedure: TRANSURETHRAL RESECTION OF BLADDER TUMOR (TURBT);  Surgeon: Malka So;  Location: [facility];  Service: Urology;  Laterality: N/A;  Cysto, Bladder Biopsy, TURBT with Gyrus,   . TRANSURETHRAL RESECTION OF BLADDER TUMOR WITH GYRUS (TURBT-GYRUS)  2007; 2009; 2010   "for tumors on surface of bladder"  . TUMOR EXCISION  1976; 1980's   "fatty tumor cut off her upper back; left thumb"  . VAGINAL HYSTERECTOMY  1960   partial    Family History  Problem Relation Age of Onset  . Anesthesia problems Daughter   . Cancer Mother        uterine  . Cancer Sister        leukemia  . Cancer Brother   . Prostate cancer Brother   . Cancer Brother   . Breast cancer Other        neice  . Heart disease Brother   .  Cancer Brother   . Heart attack Sister   . Colon cancer Neg Hx    Social History   Socioeconomic History  . Marital status: Widowed    Spouse name: Not on file  . Number of children: 3  . Years of education: Not on file  . Highest education level: 8th grade  Occupational History  . Occupation: Retired  Scientific laboratory technician  . Financial resource strain: Not hard at all  . Food insecurity:    Worry: Never true    Inability: Never true  . Transportation needs:    Medical: No    Non-medical: No  Tobacco Use  . Smoking status: Former Smoker    Packs/day: 1.00    Years: 40.00    Pack years: 40.00    Types: Cigarettes    Last attempt to quit: 11/07/1982    Years since quitting: 36.5  . Smokeless tobacco: Former Systems developer    Types: Snuff  Substance and Sexual Activity  . Alcohol use: Yes    Alcohol/week: 7.0 standard drinks    Types: 7 Glasses of wine per week    Comment: 1 glass of wine prior to bed  . Drug use: No  . Sexual activity: Not Currently    Birth control/protection: Surgical  Lifestyle  . Physical activity:    Days per week: 0 days    Minutes per session: 0 min  . Stress: Only a little  Relationships  . Social connections:    Talks on phone: More than three times a week    Gets together: More than three times a week    Attends religious service: More than 4 times per year    Active member of club or organization: No    Attends meetings of clubs or organizations: Never    Relationship status: Widowed  Other Topics Concern  . Not on file  Social History Narrative   She is a widowed mother of 41, grandmother of 41, great grandmother of 67. She does not   really get routine exercise. She quit smoking in 1983. She does  not smoke and does not use illicit drugs and does not drink.    Activities of Daily Living In your present state of health, do you have any difficulty performing the following activities: 05/11/2019 08/31/2018  Hearing? Y -  Comment she wears hearing aids -   Vision? N -  Difficulty concentrating or making decisions? N -  Walking or climbing stairs? Y -  Comment she has a ramp at her house that she uses -  Dressing or bathing? N -  Doing errands, shopping? [name]  Comment her daughters take care of the grocery shopping and running errands -  Conservation officer, nature and eating ? N -  Using the Toilet? N -  In the past six months, have you accidently leaked urine? Y -  Comment pt wears pads -  Do you have problems with loss of bowel control? N -  Managing your Medications? N -  Managing your Finances? Y -  Comment her daughters help manage the finances -  Housekeeping or managing your Housekeeping? Y -  Comment her daughter helps with the housekeeping -  Some recent data might be hidden    Patient Literacy How often do you need to have someone help you when you read instructions, pamphlets, or other written materials from your doctor or pharmacy?: 1 - Never What is the last grade level you completed in school?: 8th grade  Exercise Current Exercise Habits: The patient does not participate in regular exercise at present, Exercise limited by: orthopedic condition(s)  Diet Patient reports consuming 3 meals a day and 1 snack(s) a day Patient reports that her primary diet is: Regular Patient reports that she does have regular access to food.   Depression Screen PHQ 2/9 Scores 05/11/2019 02/21/2019 02/14/2019 02/12/2019 02/06/2019 10/26/2018 10/18/2018  PHQ - 2 Score 0 0 0 0 0 0 0  PHQ- 9 Score - 0 0 - 0 0 -     Fall Risk Fall Risk  05/11/2019 02/21/2019 02/14/2019 02/12/2019 02/06/2019  Falls in the past year? 1 1 0 1 1  Number falls in past yr: 1 0 - 0 0  Injury with Fall? 0 0 - 0 0  Risk Factor Category  - - - - -  Risk for fall due to : Impaired balance/gait - - - -  Follow up - - - - -     Objective:  [name] seemed alert and oriented and she participated appropriately during our telephone visit.  Blood Pressure Weight BMI  BP Readings  from Last 3 Encounters:  04/02/19 (!) 120/40  02/21/19 127/61  02/14/19 138/73   Wt Readings from Last 3 Encounters:  04/02/19 156 lb (70.8 kg)  02/21/19 160 lb (72.6 kg)  02/14/19 157 lb (71.2 kg)   BMI Readings from Last 1 Encounters:  04/02/19 24.43 kg/m    *Unable to obtain current vital signs, weight, and BMI due to telephone visit type  Hearing/Vision  . [place] did not seem to have difficulty with hearing/understanding during the telephone conversation . Reports that she has not had a formal eye exam by an eye care professional within the past year . Reports that she has not had a formal hearing evaluation within the past year *Unable to fully assess hearing and vision during telephone visit type  Cognitive Function: 6CIT Screen 05/11/2019  What Year? 0 points  What month? 0 points  What time? 0 points  Count back from 20 0 points  Months in reverse 0  points  Repeat phrase 2 points  Total Score 2    Normal Cognitive Function Screening: Yes (Normal:0-7, Significant for Dysfunction: >8)  Immunization & Health Maintenance Record Immunization History  Administered Date(s) Administered  . Influenza, High Dose Seasonal PF 09/17/2017  . Influenza-Unspecified 09/25/1996, 09/17/1997, 10/31/2004  . Pneumococcal-Unspecified 08/14/2011    Health Maintenance  Topic Date Due  . TETANUS/TDAP  11/06/1940  . INFLUENZA VACCINE  07/14/2019  . DEXA SCAN  Completed  . PNA vac Low Risk Adult  Completed       Assessment  This is a routine wellness examination for SHAUNTAVIA BRACKIN.  Health Maintenance: Due or Overdue Health Maintenance Due  Topic Date Due  . TETANUS/TDAP  11/06/1940    [name] does not need a referral for Community Assistance: Care Management:   no Social Work:    no Prescription Assistance:  no Nutrition/Diabetes Education:  no   Plan:  Personalized Goals Goals Addressed            This Visit's Progress   . DIET - REDUCE SODIUM  INTAKE        Personalized Health Maintenance & Screening Recommendations  Pneumococcal vaccine  Td vaccine Shingles vaccine  Lung Cancer Screening Recommended: no (Low Dose CT Chest recommended if Age 30-80 years, 30 pack-year currently smoking OR have quit w/in past 15 years) Hepatitis C Screening recommended: no HIV Screening recommended: no  Advanced Directives: Written information was not prepared per patient's request.  Referrals & Orders No orders of the defined types were placed in this encounter.   Follow-up Plan . Follow-up with Janora Norlander, DO as planned . Consider TDAP, Shingles and Prevnar vaccines at your next visit with your PCP . Bring a copy of your Advanced Directives to your next visit for our records.   I have personally reviewed and noted the following in the patient's chart:   . Medical and social history . Use of alcohol, tobacco or illicit drugs  . Current medications and supplements . Functional ability and status . Nutritional status . Physical activity . Advanced directives . List of other physicians . Hospitalizations, surgeries, and ER visits in previous 12 months . Vitals . Screenings to include cognitive, depression, and falls . Referrals and appointments  In addition, I have reviewed and discussed with [name] certain preventive protocols, quality metrics, and best practice recommendations. A written personalized care plan for preventive services as well as general preventive health recommendations is available and can be mailed to the patient at her request.      Marylin Crosby  05/11/2019

## 2019-05-11 NOTE — Patient Instructions (Signed)
Preventive Care 83 Years and Older, Female Preventive care refers to lifestyle choices and visits with your health care provider that can promote health and wellness. What does preventive care include?  A yearly physical exam. This is also called an annual well check.  Dental exams once or twice a year.  Routine eye exams. Ask your health care provider how often you should have your eyes checked.  Personal lifestyle choices, including: ? Daily care of your teeth and gums. ? Regular physical activity. ? Eating a healthy diet. ? Avoiding tobacco and drug use. ? Limiting alcohol use. ? Practicing safe sex. ? Taking low-dose aspirin every day. ? Taking vitamin and mineral supplements as recommended by your health care provider. What happens during an annual well check? The services and screenings done by your health care provider during your annual well check will depend on your age, overall health, lifestyle risk factors, and family history of disease. Counseling Your health care provider may ask you questions about your:  Alcohol use.  Tobacco use.  Drug use.  Emotional well-being.  Home and relationship well-being.  Sexual activity.  Eating habits.  History of falls.  Memory and ability to understand (cognition).  Work and work Statistician.  Reproductive health.  Screening You may have the following tests or measurements:  Height, weight, and BMI.  Blood pressure.  Lipid and cholesterol levels. These may be checked every 5 years, or more frequently if you are over 30 years old.  Skin check.  Lung cancer screening. You may have this screening every year starting at age 27 if you have a 30-pack-year history of smoking and currently smoke or have quit within the past 15 years.  Colorectal cancer screening. All adults should have this screening starting at age 33 and continuing until age 46. You will have tests every 1-10 years, depending on your results and the  type of screening test. People at increased risk should start screening at an earlier age. Screening tests may include: ? Guaiac-based fecal occult blood testing. ? Fecal immunochemical test (FIT). ? Stool DNA test. ? Virtual colonoscopy. ? Sigmoidoscopy. During this test, a flexible tube with a tiny camera (sigmoidoscope) is used to examine your rectum and lower colon. The sigmoidoscope is inserted through your anus into your rectum and lower colon. ? Colonoscopy. During this test, a long, thin, flexible tube with a tiny camera (colonoscope) is used to examine your entire colon and rectum.  Hepatitis C blood test.  Hepatitis B blood test.  Sexually transmitted disease (STD) testing.  Diabetes screening. This is done by checking your blood sugar (glucose) after you have not eaten for a while (fasting). You may have this done every 1-3 years.  Bone density scan. This is done to screen for osteoporosis. You may have this done starting at age 37.  Mammogram. This may be done every 1-2 years. Talk to your health care provider about how often you should have regular mammograms. Talk with your health care provider about your test results, treatment options, and if necessary, the need for more tests. Vaccines Your health care provider may recommend certain vaccines, such as:  Influenza vaccine. This is recommended every year.  Tetanus, diphtheria, and acellular pertussis (Tdap, Td) vaccine. You may need a Td booster every 10 years.  Varicella vaccine. You may need this if you have not been vaccinated.  Zoster vaccine. You may need this after age 38.  Measles, mumps, and rubella (MMR) vaccine. You may need at least  one dose of MMR if you were born in 1957 or later. You may also need a second dose.  Pneumococcal 13-valent conjugate (PCV13) vaccine. One dose is recommended after age 24.  Pneumococcal polysaccharide (PPSV23) vaccine. One dose is recommended after age 24.  Meningococcal  vaccine. You may need this if you have certain conditions.  Hepatitis A vaccine. You may need this if you have certain conditions or if you travel or work in places where you may be exposed to hepatitis A.  Hepatitis B vaccine. You may need this if you have certain conditions or if you travel or work in places where you may be exposed to hepatitis B.  Haemophilus influenzae type b (Hib) vaccine. You may need this if you have certain conditions. Talk to your health care provider about which screenings and vaccines you need and how often you need them. This information is not intended to replace advice given to you by your health care provider. Make sure you discuss any questions you have with your health care provider. Document Released: 12/26/2015 Document Revised: 01/19/2018 Document Reviewed: 09/30/2015 Elsevier Interactive Patient Education  2019 [employer].

## 2019-05-15 ENCOUNTER — Other Ambulatory Visit: Payer: Self-pay

## 2019-05-15 ENCOUNTER — Telehealth: Payer: Self-pay | Admitting: Family Medicine

## 2019-05-15 NOTE — Telephone Encounter (Signed)
Cough  Congestion - productive   Only when using inhaler - the cough has been for months and it gets better after inhaler use.   Pt clear

## 2019-05-16 ENCOUNTER — Encounter: Payer: Self-pay | Admitting: Pharmacist Clinician (PhC)/ Clinical Pharmacy Specialist

## 2019-05-16 ENCOUNTER — Ambulatory Visit (INDEPENDENT_AMBULATORY_CARE_PROVIDER_SITE_OTHER): Payer: PPO | Admitting: Pharmacist Clinician (PhC)/ Clinical Pharmacy Specialist

## 2019-05-16 DIAGNOSIS — I480 Paroxysmal atrial fibrillation: Secondary | ICD-10-CM

## 2019-05-16 LAB — COAGUCHEK XS/INR WAIVED
INR: 2.6 — ABNORMAL HIGH (ref 0.9–1.1)
Prothrombin Time: 30.8 s

## 2019-05-16 NOTE — Patient Instructions (Signed)
Description   Continue taking 2 tablets (6mg ) on Monday, Wednesdays and Fridays and take 1 tablet (3mg ) all other days of the week.  INR today is 2.6  (goal is 2-3)  Perfect reading today

## 2019-05-25 DIAGNOSIS — J301 Allergic rhinitis due to pollen: Secondary | ICD-10-CM | POA: Diagnosis not present

## 2019-05-25 DIAGNOSIS — R7309 Other abnormal glucose: Secondary | ICD-10-CM | POA: Diagnosis not present

## 2019-05-25 DIAGNOSIS — E7800 Pure hypercholesterolemia, unspecified: Secondary | ICD-10-CM | POA: Diagnosis not present

## 2019-05-25 DIAGNOSIS — I10 Essential (primary) hypertension: Secondary | ICD-10-CM | POA: Diagnosis not present

## 2019-06-12 DIAGNOSIS — H01003 Unspecified blepharitis right eye, unspecified eyelid: Secondary | ICD-10-CM | POA: Diagnosis not present

## 2019-06-12 DIAGNOSIS — Z961 Presence of intraocular lens: Secondary | ICD-10-CM | POA: Diagnosis not present

## 2019-06-12 DIAGNOSIS — H1045 Other chronic allergic conjunctivitis: Secondary | ICD-10-CM | POA: Diagnosis not present

## 2019-06-12 DIAGNOSIS — H01006 Unspecified blepharitis left eye, unspecified eyelid: Secondary | ICD-10-CM | POA: Diagnosis not present

## 2019-06-27 ENCOUNTER — Encounter: Payer: Self-pay | Admitting: Pharmacist Clinician (PhC)/ Clinical Pharmacy Specialist

## 2019-06-28 ENCOUNTER — Other Ambulatory Visit: Payer: Self-pay

## 2019-06-28 ENCOUNTER — Other Ambulatory Visit: Payer: Self-pay | Admitting: Internal Medicine

## 2019-06-28 DIAGNOSIS — Z1231 Encounter for screening mammogram for malignant neoplasm of breast: Secondary | ICD-10-CM

## 2019-06-29 ENCOUNTER — Ambulatory Visit (INDEPENDENT_AMBULATORY_CARE_PROVIDER_SITE_OTHER): Payer: PPO | Admitting: Family Medicine

## 2019-06-29 VITALS — BP 138/80 | HR 84 | Temp 97.8°F | Ht 67.0 in | Wt 159.0 lb

## 2019-06-29 DIAGNOSIS — Z7901 Long term (current) use of anticoagulants: Secondary | ICD-10-CM

## 2019-06-29 DIAGNOSIS — Z96651 Presence of right artificial knee joint: Secondary | ICD-10-CM | POA: Diagnosis not present

## 2019-06-29 DIAGNOSIS — G8929 Other chronic pain: Secondary | ICD-10-CM

## 2019-06-29 DIAGNOSIS — M25561 Pain in right knee: Secondary | ICD-10-CM | POA: Diagnosis not present

## 2019-06-29 DIAGNOSIS — I482 Chronic atrial fibrillation, unspecified: Secondary | ICD-10-CM | POA: Diagnosis not present

## 2019-06-29 LAB — COAGUCHEK XS/INR WAIVED
INR: 2.1 — ABNORMAL HIGH (ref 0.9–1.1)
Prothrombin Time: 24.9 s

## 2019-06-29 MED ORDER — DICLOFENAC SODIUM 1 % TD GEL: 4.0000 g | Freq: Four times a day (QID) | TRANSDERMAL | 1 refills | Status: DC

## 2019-06-29 NOTE — Patient Instructions (Signed)
Your coumadin level looks good today.  Keep taking the same schedule of coumadin.  See me in 1 month for recheck.  I sent in Diclofenac gel for your knee pain/ inflammation.  You can use 4 times daily if needed.    Referral back to Dr Alvan Dame is in place.  Call me if you do not get called for an appointment by next week.

## 2019-06-29 NOTE — Progress Notes (Signed)
Subjective: CC: INR check, PNA PCP: Janora Norlander, DO FFM:[id number] E Marchesi is a 85 y.o. female presenting to clinic today for:  1.  Chronic anticoagulation for atrial fibrillation and history of DVT Patient with known chronic atrial fibrillation with chronic anticoagulation on Coumadin.  Goal INR is 2-3.    Patient was seen 1 month ago and INR was therapeutic at 2.6. She was instructed to continue use of Coumadin 6 mg on Mondays, Wednesdays and Fridays with 3 mg all other days.  Since her last visit she had a tele-visit with her cardiologist.  Plan to have ultrasound of her abdominal aortic aneurysm in October.  Denies any hemoptysis, GI bleed, hematuria or vaginal bleeding. No palpitations, chest pain, shortness of breath.  2. Chronic knee pain Patient reports chronic right sided knee pain and swelling. She used to use an over-the-counter cream which seemed to help some.  She does have history of knee replacement on that side at [facility] in [place].  She has not seen them for a couple of years but notes that recently she twisted it getting out of the car and the pain has gotten a little bit worse.  She thinks she might need a new injection or at least evaluation of the knee and wants to see if we can set that up for her.  Unable to take oral NSAID secondary to chronic anticoagulation.  ROS: Per HPI  Allergies  Allergen Reactions  . Contrast Media [Iodinated Diagnostic Agents] Hives  . Amoxicillin Itching  . Ampicillin Itching  . Levaquin [Levofloxacin] Itching and Rash  . Penicillins Rash    "haven't had it in years"  . Vancomycin Itching and Rash   Past Medical History:  Diagnosis Date  . AAA (abdominal aortic aneurysm) ([place])   . Allergy   . Anxiety   . Bladder cancer ([place]) dx'd 2011   Chronic microscopic hematuria; transitional cell cancer  . Blood transfusion 1960   "related to hysterectomy"  . Chronic atrial fibrillation    Anticoagulated with warfarin,  rate control with diltiazem and Toprol  . Chronic back pain   . Chronic headache    "probably 2/wk" (08/28/2015)  . Concussion w/o coma 9/35/7017   Complicated by subarachnoid hemorrhage.  "even now has times when she's not able to comprehend" (05/08/12)  . Coronary artery disease    Nonobstructive  . Diverticulitis   . Diverticulosis   . DVT (deep venous thrombosis), right 2005   "right calf after 8 foot fall"  . Edema of both legs    Chronic, thought to be secondary to DVTs  . External hemorrhoids   . Family history of adverse reaction to anesthesia 2012   daughter "had OR for crushed hand; had problems w/anesthesia & I was in there all day long" (  . Fatty liver   . Frequent UTI   . GERD (gastroesophageal reflux disease)   . H/O hiatal hernia   . High cholesterol   . Migraine    "rare now" (08/28/2015)  . Pneumonia ~ 2010;  2013; 07/2015  . Rheumatoid arthritis(714.0)    "hands" (08/28/2015)  . Urinary frequency     Current Outpatient Medications:  .  atorvastatin (LIPITOR) 10 MG tablet, Take 10 mg by mouth daily., Disp: , Rfl:  .  benzonatate (TESSALON PERLES) 100 MG capsule, Take 1 capsule (100 mg total) by mouth 3 (three) times daily as needed for cough., Disp: 20 capsule, Rfl: 0 .  calcium carbonate (OS-CAL -  DOSED IN MG OF ELEMENTAL CALCIUM) 1250 MG tablet, Take 1 tablet by mouth every morning. , Disp: , Rfl:  .  cholecalciferol (VITAMIN D) 1000 UNITS tablet, Take 1,000 Units by mouth daily., Disp: , Rfl:  .  diltiazem (CARDIZEM CD) 240 MG 24 hr capsule, Take 1 capsule (240 mg total) by mouth daily., Disp: 30 capsule, Rfl: 0 .  furosemide (LASIX) 20 MG tablet, Take 1 tablet (20 mg total) by mouth daily. (Patient taking differently: Take 20-40 mg by mouth daily. Take one tablet (20MG ) by mouth daily except on Mon, Wed, Friday take 2 tablets (40 MG)), Disp: 30 tablet, Rfl: 0 .  loratadine (CLARITIN) 10 MG tablet, TAKE 1 TABLET BY MOUTH ONCE DAILY FOR ALLERGY, Disp: , Rfl:  .   Multiple Vitamins-Minerals (MULTIVITAMINS THER. W/MINERALS) TABS, Take 1 tablet by mouth daily. , Disp: , Rfl:  .  Omega-3 Fatty Acids (FISH OIL) 1000 MG CAPS, Take 1,000 mg by mouth daily., Disp: , Rfl:  .  omeprazole (PRILOSEC) 20 MG capsule, TAKE 1 CAPSULE BY MOUTH TWICE A DAY, Disp: 180 capsule, Rfl: 2 .  PROAIR HFA 108 (90 Base) MCG/ACT inhaler, TAKE 2 PUFFS BY MOUTH EVERY 6 HOURS AS NEEDED FOR WHEEZE OR SHORTNESS OF BREATH, Disp: 8.5 Inhaler, Rfl: 4 .  warfarin (COUMADIN) 3 MG tablet, Take 2 tablets (6mg ) on Mondays, Wednesdays, Fridays, and Saturdays and 1 tablet (3mg ) on Tuesdays, Thursdays, and Sundays, Disp: 60 tablet, Rfl: 3 Social History   Socioeconomic History  . Marital status: Widowed    Spouse name: Not on file  . Number of children: 3  . Years of education: Not on file  . Highest education level: 8th grade  Occupational History  . Occupation: Retired  Scientific laboratory technician  . Financial resource strain: Not hard at all  . Food insecurity    Worry: Never true    Inability: Never true  . Transportation needs    Medical: No    Non-medical: No  Tobacco Use  . Smoking status: Former Smoker    Packs/day: 1.00    Years: 40.00    Pack years: 40.00    Types: Cigarettes    Quit date: 11/07/1982    Years since quitting: 36.6  . Smokeless tobacco: Former Systems developer    Types: Snuff  Substance and Sexual Activity  . Alcohol use: Yes    Alcohol/week: 7.0 standard drinks    Types: 7 Glasses of wine per week    Comment: 1 glass of wine prior to bed  . Drug use: No  . Sexual activity: Not Currently    Birth control/protection: Surgical  Lifestyle  . Physical activity    Days per week: 0 days    Minutes per session: 0 min  . Stress: Only a little  Relationships  . Social connections    Talks on phone: More than three times a week    Gets together: More than three times a week    Attends religious service: More than 4 times per year    Active member of club or organization: No     Attends meetings of clubs or organizations: Never    Relationship status: Widowed  . Intimate partner violence    Fear of current or ex partner: No    Emotionally abused: No    Physically abused: No    Forced sexual activity: No  Other Topics Concern  . Not on file  Social History Narrative   She is a widowed mother of  3, grandmother of 60, great grandmother of 22. She does not   really get routine exercise. She quit smoking in 1983. She does not smoke and does not use illicit drugs and does not drink.   Family History  Problem Relation Age of Onset  . Anesthesia problems Daughter   . Cancer Mother        uterine  . Cancer Sister        leukemia  . Cancer Brother   . Prostate cancer Brother   . Cancer Brother   . Breast cancer Other        neice  . Heart disease Brother   . Cancer Brother   . Heart attack Sister   . Colon cancer Neg Hx     Objective: Office vital signs reviewed. There were no vitals taken for this visit.  Physical Examination:  General: Awake, alert, well nourished, well appearing. No acute distress HEENT: Sclera white.  MMM Cardio: Irregularly irregular.  Rate controlled.  S1S2 heard, no murmurs appreciated Pulm: Clear to auscultation bilaterally.  Normal work of breathing on room air.  No wheezes, rhonchi or rales. MSK: Chronic arthritic changes noted in the right knee.  No gross swelling or effusions appreciated.  No increased warmth or redness.  Uses cane for ambulation.  Assessment/ Plan: 85 y.o. female   1. Chronic atrial fibrillation INR at goal at 2.1.  Continue current regimen. - CoaguChek XS/INR Waived  2. Chronic anticoagulation As above  3. 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  I given her Voltaren gel to apply to the affected areas as needed since pain unable to tolerate oral NSAIDs secondary to chronic anticoagulation. - Ambulatory referral to Orthopedic Surgery - diclofenac  sodium (VOLTAREN) 1 % GEL; Apply 4 g topically 4 (four) times daily.  Dispense: 300 g; Refill: 1    Orders Placed This Encounter  Procedures  . CoaguChek XS/INR Waived   No orders of the defined types were placed in this encounter.    Janora Norlander, DO [place] [phone]

## 2019-07-06 DIAGNOSIS — Z96651 Presence of right artificial knee joint: Secondary | ICD-10-CM | POA: Diagnosis not present

## 2019-07-06 DIAGNOSIS — M25561 Pain in right knee: Secondary | ICD-10-CM | POA: Diagnosis not present

## 2019-07-20 DIAGNOSIS — Z87891 Personal history of nicotine dependence: Secondary | ICD-10-CM | POA: Diagnosis not present

## 2019-07-20 DIAGNOSIS — I10 Essential (primary) hypertension: Secondary | ICD-10-CM | POA: Diagnosis not present

## 2019-07-20 DIAGNOSIS — Z7901 Long term (current) use of anticoagulants: Secondary | ICD-10-CM | POA: Diagnosis not present

## 2019-07-20 DIAGNOSIS — K649 Unspecified hemorrhoids: Secondary | ICD-10-CM | POA: Diagnosis not present

## 2019-07-20 DIAGNOSIS — Z96651 Presence of right artificial knee joint: Secondary | ICD-10-CM | POA: Diagnosis not present

## 2019-07-20 DIAGNOSIS — I350 Nonrheumatic aortic (valve) stenosis: Secondary | ICD-10-CM | POA: Diagnosis not present

## 2019-07-20 DIAGNOSIS — I739 Peripheral vascular disease, unspecified: Secondary | ICD-10-CM | POA: Diagnosis not present

## 2019-07-20 DIAGNOSIS — I482 Chronic atrial fibrillation, unspecified: Secondary | ICD-10-CM | POA: Diagnosis not present

## 2019-07-20 DIAGNOSIS — I714 Abdominal aortic aneurysm, without rupture: Secondary | ICD-10-CM | POA: Diagnosis not present

## 2019-07-20 DIAGNOSIS — I82401 Acute embolism and thrombosis of unspecified deep veins of right lower extremity: Secondary | ICD-10-CM | POA: Diagnosis not present

## 2019-07-20 DIAGNOSIS — K219 Gastro-esophageal reflux disease without esophagitis: Secondary | ICD-10-CM | POA: Diagnosis not present

## 2019-07-20 DIAGNOSIS — I2510 Atherosclerotic heart disease of native coronary artery without angina pectoris: Secondary | ICD-10-CM | POA: Diagnosis not present

## 2019-07-20 DIAGNOSIS — Z8551 Personal history of malignant neoplasm of bladder: Secondary | ICD-10-CM | POA: Diagnosis not present

## 2019-07-20 DIAGNOSIS — Z86018 Personal history of other benign neoplasm: Secondary | ICD-10-CM | POA: Diagnosis not present

## 2019-07-20 DIAGNOSIS — T8484XD Pain due to internal orthopedic prosthetic devices, implants and grafts, subsequent encounter: Secondary | ICD-10-CM | POA: Diagnosis not present

## 2019-07-20 DIAGNOSIS — E785 Hyperlipidemia, unspecified: Secondary | ICD-10-CM | POA: Diagnosis not present

## 2019-07-20 DIAGNOSIS — K5900 Constipation, unspecified: Secondary | ICD-10-CM | POA: Diagnosis not present

## 2019-07-20 DIAGNOSIS — M069 Rheumatoid arthritis, unspecified: Secondary | ICD-10-CM | POA: Diagnosis not present

## 2019-08-01 ENCOUNTER — Other Ambulatory Visit (HOSPITAL_COMMUNITY): Payer: Self-pay | Admitting: Cardiology

## 2019-08-01 ENCOUNTER — Other Ambulatory Visit: Payer: Self-pay

## 2019-08-01 ENCOUNTER — Ambulatory Visit (HOSPITAL_COMMUNITY)
Admission: RE | Admit: 2019-08-01 | Discharge: 2019-08-01 | Disposition: A | Discharge status: Home / Self Care | Payer: PPO | Source: Ambulatory Visit | Attending: Cardiovascular Disease | Admitting: Cardiovascular Disease

## 2019-08-01 DIAGNOSIS — I714 Abdominal aortic aneurysm, without rupture, unspecified: Secondary | ICD-10-CM

## 2019-08-02 DIAGNOSIS — K219 Gastro-esophageal reflux disease without esophagitis: Secondary | ICD-10-CM | POA: Diagnosis not present

## 2019-08-02 DIAGNOSIS — Z8551 Personal history of malignant neoplasm of bladder: Secondary | ICD-10-CM | POA: Diagnosis not present

## 2019-08-02 DIAGNOSIS — I10 Essential (primary) hypertension: Secondary | ICD-10-CM | POA: Diagnosis not present

## 2019-08-02 DIAGNOSIS — Z7901 Long term (current) use of anticoagulants: Secondary | ICD-10-CM | POA: Diagnosis not present

## 2019-08-02 DIAGNOSIS — I739 Peripheral vascular disease, unspecified: Secondary | ICD-10-CM | POA: Diagnosis not present

## 2019-08-02 DIAGNOSIS — Z87891 Personal history of nicotine dependence: Secondary | ICD-10-CM | POA: Diagnosis not present

## 2019-08-02 DIAGNOSIS — I714 Abdominal aortic aneurysm, without rupture: Secondary | ICD-10-CM | POA: Diagnosis not present

## 2019-08-02 DIAGNOSIS — I2510 Atherosclerotic heart disease of native coronary artery without angina pectoris: Secondary | ICD-10-CM | POA: Diagnosis not present

## 2019-08-02 DIAGNOSIS — Z96651 Presence of right artificial knee joint: Secondary | ICD-10-CM | POA: Diagnosis not present

## 2019-08-02 DIAGNOSIS — I82401 Acute embolism and thrombosis of unspecified deep veins of right lower extremity: Secondary | ICD-10-CM | POA: Diagnosis not present

## 2019-08-02 DIAGNOSIS — Z86018 Personal history of other benign neoplasm: Secondary | ICD-10-CM | POA: Diagnosis not present

## 2019-08-02 DIAGNOSIS — T8484XD Pain due to internal orthopedic prosthetic devices, implants and grafts, subsequent encounter: Secondary | ICD-10-CM | POA: Diagnosis not present

## 2019-08-02 DIAGNOSIS — K649 Unspecified hemorrhoids: Secondary | ICD-10-CM | POA: Diagnosis not present

## 2019-08-02 DIAGNOSIS — E785 Hyperlipidemia, unspecified: Secondary | ICD-10-CM | POA: Diagnosis not present

## 2019-08-02 DIAGNOSIS — I350 Nonrheumatic aortic (valve) stenosis: Secondary | ICD-10-CM | POA: Diagnosis not present

## 2019-08-02 DIAGNOSIS — K5900 Constipation, unspecified: Secondary | ICD-10-CM | POA: Diagnosis not present

## 2019-08-02 DIAGNOSIS — I482 Chronic atrial fibrillation, unspecified: Secondary | ICD-10-CM | POA: Diagnosis not present

## 2019-08-02 DIAGNOSIS — M069 Rheumatoid arthritis, unspecified: Secondary | ICD-10-CM | POA: Diagnosis not present

## 2019-08-08 ENCOUNTER — Telehealth: Payer: Self-pay | Admitting: *Deleted

## 2019-08-08 NOTE — Telephone Encounter (Signed)
LEFT MESSAGE TO CALL BACK @ RESULTS . ALSO WOULD LIKE SCHEDULE FOR 6 MONTH F/U FOR OCT 2020

## 2019-08-08 NOTE — Telephone Encounter (Signed)
-----   Message from Leonie Man, MD sent at 08/06/2019  7:11 PM EDT ----- Abdominal aortic ultrasound shows mild dilation of the abdominal aorta, but diameter measures 3.1 cm which is improved from 2018.  There does also appear to be some abnormal dilation of the iliac arteries.  Also unchanged.  For routine surveillance one would consider annual follow-up, however I think given that the patient is 85 years old, would probably not follow-up any further.  Glenetta Hew, MD

## 2019-08-10 ENCOUNTER — Other Ambulatory Visit: Payer: Self-pay

## 2019-08-13 ENCOUNTER — Encounter: Payer: Self-pay | Admitting: Family Medicine

## 2019-08-13 ENCOUNTER — Other Ambulatory Visit: Payer: Self-pay

## 2019-08-13 ENCOUNTER — Ambulatory Visit (INDEPENDENT_AMBULATORY_CARE_PROVIDER_SITE_OTHER): Payer: PPO | Admitting: Family Medicine

## 2019-08-13 DIAGNOSIS — I482 Chronic atrial fibrillation, unspecified: Secondary | ICD-10-CM | POA: Diagnosis not present

## 2019-08-13 DIAGNOSIS — Z7901 Long term (current) use of anticoagulants: Secondary | ICD-10-CM | POA: Diagnosis not present

## 2019-08-13 LAB — COAGUCHEK XS/INR WAIVED
INR: 2.3 — ABNORMAL HIGH (ref 0.9–1.1)
Prothrombin Time: 27.3 s

## 2019-08-13 LAB — POCT INR: INR: 2.3 (ref 2–3)

## 2019-08-13 NOTE — Progress Notes (Signed)
Telephone visit  Subjective: CC: f/u A fib/ Coumadin level PCP: Janora Norlander, DO GK:[id number] E Brandley is a 85 y.o. female calls for telephone consult today. Patient provides verbal consent for consult held via phone.  Location of patient: [facility], [facility]'s office Location of provider: Working remotely from home Others present for call: Zigmund Daniel [place]   1.  Chronic atrial fibrillation Patient was seen 1 month ago and INR was therapeutic at 2.1.  She takes Coumadin 6 mg on Mondays, Wednesdays and Fridays and 3 mg daily all other days. Denies Hematochezia, melena, vaginal bleeding, hematuria. Denies Heart palpitations, chest pain, shortness of breath.   ROS: Per HPI  Allergies  Allergen Reactions  . Contrast Media [Iodinated Diagnostic Agents] Hives  . Amoxicillin Itching  . Ampicillin Itching  . Levaquin [Levofloxacin] Itching and Rash  . Penicillins Rash    "haven't had it in years"  . Vancomycin Itching and Rash   Past Medical History:  Diagnosis Date  . AAA (abdominal aortic aneurysm) ([place])   . Allergy   . Anxiety   . Bladder cancer ([place]) dx'd 2011   Chronic microscopic hematuria; transitional cell cancer  . Blood transfusion 1960   "related to hysterectomy"  . Chronic atrial fibrillation    Anticoagulated with warfarin, rate control with diltiazem and Toprol  . Chronic back pain   . Chronic headache    "probably 2/wk" (08/28/2015)  . Concussion w/o coma [id number]   Complicated by subarachnoid hemorrhage.  "even now has times when she's not able to comprehend" (05/08/12)  . Coronary artery disease    Nonobstructive  . Diverticulitis   . Diverticulosis   . DVT (deep venous thrombosis), right 2005   "right calf after 8 foot fall"  . Edema of both legs    Chronic, thought to be secondary to DVTs  . External hemorrhoids   . Family history of adverse reaction to anesthesia 2012   daughter "had OR for crushed hand; had problems w/anesthesia & I was in there all day  long" (  . Fatty liver   . Frequent UTI   . GERD (gastroesophageal reflux disease)   . H/O hiatal hernia   . High cholesterol   . Migraine    "rare now" (08/28/2015)  . Pneumonia ~ 2010;  2013; 07/2015  . Rheumatoid arthritis(714.0)    "hands" (08/28/2015)  . Urinary frequency     Current Outpatient Medications:  .  atorvastatin (LIPITOR) 10 MG tablet, Take 10 mg by mouth daily., Disp: , Rfl:  .  benzonatate (TESSALON PERLES) 100 MG capsule, Take 1 capsule (100 mg total) by mouth 3 (three) times daily as needed for cough., Disp: 20 capsule, Rfl: 0 .  calcium carbonate (OS-CAL - DOSED IN MG OF ELEMENTAL CALCIUM) 1250 MG tablet, Take 1 tablet by mouth every morning. , Disp: , Rfl:  .  cholecalciferol (VITAMIN D) 1000 UNITS tablet, Take 1,000 Units by mouth daily., Disp: , Rfl:  .  diclofenac sodium (VOLTAREN) 1 % GEL, Apply 4 g topically 4 (four) times daily., Disp: 300 g, Rfl: 1 .  diltiazem (CARDIZEM CD) 240 MG 24 hr capsule, Take 1 capsule (240 mg total) by mouth daily., Disp: 30 capsule, Rfl: 0 .  furosemide (LASIX) 20 MG tablet, Take 1 tablet (20 mg total) by mouth daily. (Patient taking differently: Take 20-40 mg by mouth daily. Take one tablet (20MG ) by mouth daily except on Mon, Wed, Friday take 2 tablets (40 MG)), Disp: 30 tablet, Rfl: 0 .  loratadine (CLARITIN) 10 MG tablet, TAKE 1 TABLET BY MOUTH ONCE DAILY FOR ALLERGY, Disp: , Rfl:  .  Multiple Vitamins-Minerals (MULTIVITAMINS THER. W/MINERALS) TABS, Take 1 tablet by mouth daily. , Disp: , Rfl:  .  Omega-3 Fatty Acids (FISH OIL) 1000 MG CAPS, Take 1,000 mg by mouth daily., Disp: , Rfl:  .  omeprazole (PRILOSEC) 20 MG capsule, TAKE 1 CAPSULE BY MOUTH TWICE A DAY, Disp: 180 capsule, Rfl: 2 .  PROAIR HFA 108 (90 Base) MCG/ACT inhaler, TAKE 2 PUFFS BY MOUTH EVERY 6 HOURS AS NEEDED FOR WHEEZE OR SHORTNESS OF BREATH, Disp: 8.5 Inhaler, Rfl: 4 .  warfarin (COUMADIN) 3 MG tablet, Take 2 tablets (6mg ) on Mondays, Wednesdays, Fridays, and  Saturdays and 1 tablet (3mg ) on Tuesdays, Thursdays, and Sundays, Disp: 60 tablet, Rfl: 3  Assessment/ Plan: 85 y.o. female   1. Chronic atrial fibrillation INR is therapeutic today at 2.3.  Continue current regimen of Coumadin 6 mg Monday Wednesday and Friday and 3 mg daily all other days.  Recheck in 1 month. - CoaguChek XS/INR Waived - POCT INR  2. Chronic anticoagulation Stable - POCT INR   Start time: 8:26am End time: 8:32am  Total time spent on patient care (including telephone call/ virtual visit): 15 minutes  [place], [place] [phone]

## 2019-08-14 ENCOUNTER — Ambulatory Visit
Admission: RE | Admit: 2019-08-14 | Discharge: 2019-08-14 | Disposition: A | Discharge status: Home / Self Care | Payer: PPO | Source: Ambulatory Visit | Attending: Internal Medicine | Admitting: Internal Medicine

## 2019-08-14 ENCOUNTER — Ambulatory Visit: Payer: PPO

## 2019-08-14 ENCOUNTER — Other Ambulatory Visit: Payer: Self-pay

## 2019-08-14 DIAGNOSIS — Z1231 Encounter for screening mammogram for malignant neoplasm of breast: Secondary | ICD-10-CM

## 2019-09-04 ENCOUNTER — Encounter: Payer: Self-pay | Admitting: Family

## 2019-09-04 ENCOUNTER — Ambulatory Visit (INDEPENDENT_AMBULATORY_CARE_PROVIDER_SITE_OTHER): Payer: PPO | Admitting: Family

## 2019-09-04 DIAGNOSIS — H00015 Hordeolum externum left lower eyelid: Secondary | ICD-10-CM | POA: Diagnosis not present

## 2019-09-04 MED ORDER — BACITRA-NEOMYCIN-POLYMYXIN-HC 1 % OP OINT: 1.0000 "application " | TOPICAL_OINTMENT | Freq: Three times a day (TID) | OPHTHALMIC | 0 refills | Status: DC

## 2019-09-04 NOTE — Progress Notes (Signed)
   Virtual Visit via telephone Note Due to COVID-19 pandemic this visit was conducted virtually. This visit type was conducted due to national recommendations for restrictions regarding the COVID-19 Pandemic (e.g. social distancing, sheltering in place) in an effort to limit this patient's exposure and mitigate transmission in our community. All issues noted in this document were discussed and addressed.  A physical exam was not performed with this format.  I connected with [name] on 09/04/19 at 11:03 AM by telephone and verified that I am speaking with the correct person using two identifiers. [name] is currently located at home and daughter is currently with her during visit. The provider, Evelina Dun, FNP is located in their office at time of visit.  I discussed the limitations, risks, security and privacy concerns of performing an evaluation and management service by telephone and the availability of in person appointments. I also discussed with the patient that there may be a patient responsible charge related to this service. The patient expressed understanding and agreed to proceed.   History and Present Illness:  Pt and daughter call today with complaints of left eye lid swelling and redness that started Sunday evening. She states she has used warm compresses with mild relief.  Conjunctivitis  The current episode started 3 to 5 days ago. The onset was sudden. The problem occurs continuously. The problem has been unchanged. The problem is mild. Associated symptoms include eye itching, photophobia, eye pain and eye redness. Pertinent negatives include no decreased vision, no double vision, no congestion, no ear discharge, no rhinorrhea and no eye discharge. Associated symptoms comments: Left lower lid swelling. The eye pain is mild. The left eye is affected.      Review of Systems  HENT: Negative for congestion, ear discharge and rhinorrhea.   Eyes: Positive for  photophobia, pain, redness and itching. Negative for double vision and discharge.     Observations/Objective: No SOB or distress noted   Assessment and Plan: 1. Hordeolum externum of left lower eyelid Keep clean and dry Do not pick or squeeze Good hand hygiene  No not rub your eyes IF this does not improve or worsens she needs to follow up with her Optholomologists   - bacitracin-neomycin-polymyxin-hydrocortisone (CORTISPORIN) 1 % ophthalmic ointment; Place 1 application into the left eye 3 (three) times daily.  Dispense: 3.5 g; Refill: 0      I discussed the assessment and treatment plan with the patient. The patient was provided an opportunity to ask questions and all were answered. The patient agreed with the plan and demonstrated an understanding of the instructions.   The patient was advised to call back or seek an in-person evaluation if the symptoms worsen or if the condition fails to improve as anticipated.  The above assessment and management plan was discussed with the patient. The patient verbalized understanding of and has agreed to the management plan. Patient is aware to call the clinic if symptoms persist or worsen. Patient is aware when to return to the clinic for a follow-up visit. Patient educated on when it is appropriate to go to the emergency department.   Time call ended:11:25 AM    I provided 22 minutes of non-face-to-face time during this encounter.    Evelina Dun, FNP

## 2019-09-13 ENCOUNTER — Telehealth: Payer: Self-pay | Admitting: Family Medicine

## 2019-09-14 NOTE — Telephone Encounter (Signed)
Patient had a visit with Alyse Low last week.  I didn't talk to her about this but [facility] in [place] would be a good place to go, can ask for Dr Manuella Ghazi.  If her insurance requires a referral, let me know.  Address: [address] [place], [place] [address] Phone: [phone]

## 2019-09-14 NOTE — Telephone Encounter (Signed)
Daughter aware.

## 2019-09-18 IMAGING — CR DG CHEST 2V
2 series · 2 of 2 positions shown · non-contrast
Comparison: Chest x-ray dated January 23, 2018.

CLINICAL DATA: Cough and shortness of breath.

EXAM:
CHEST  2 VIEW

[chest pa]
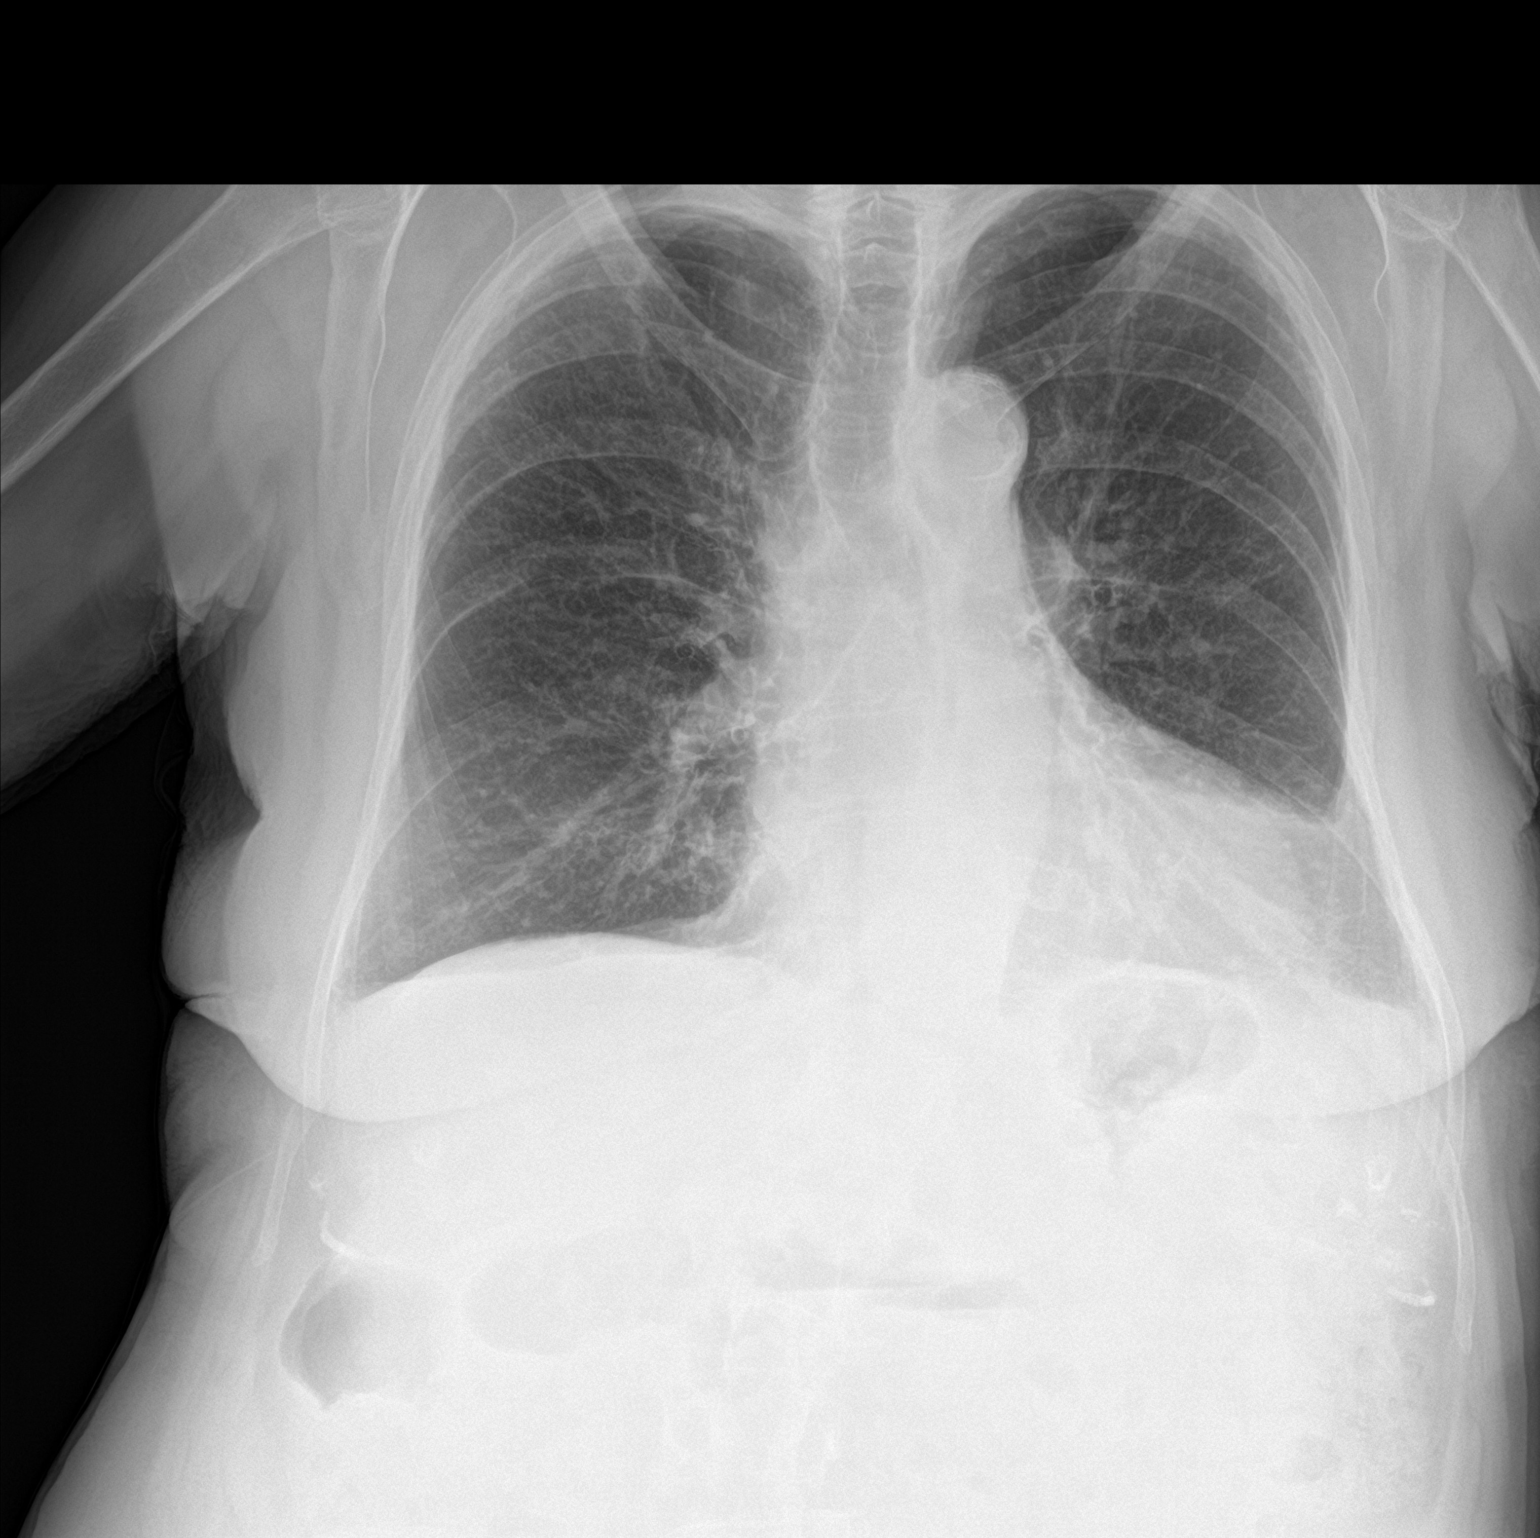

[chest lat]
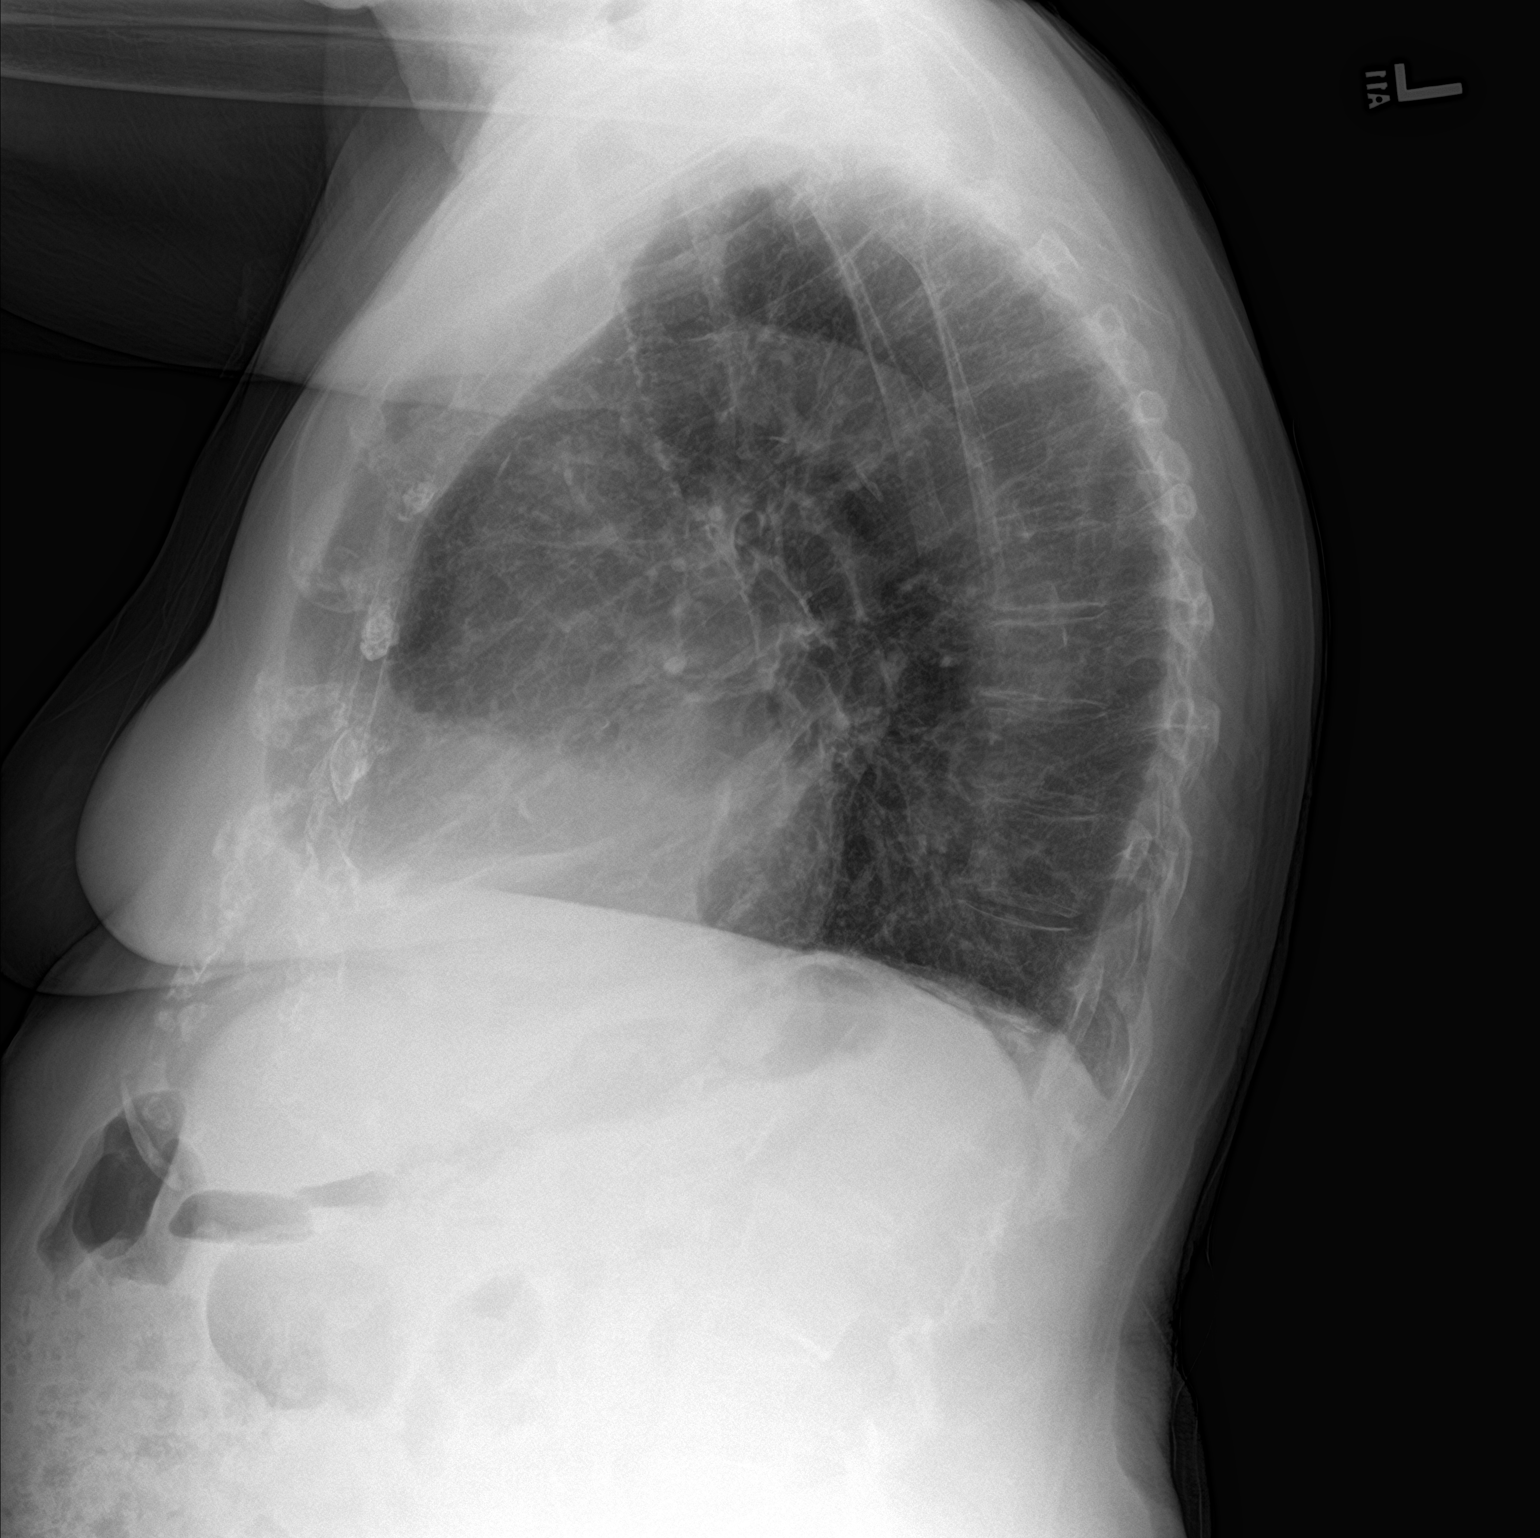

[2 of 2 positions shown; findings below may reference images not displayed]

FINDINGS: Stable mild cardiomegaly. Normal pulmonary vascularity.
Atherosclerotic calcification of the aortic arch. Stable
scarring/atelectasis in the bilateral lower lobes. No focal
consolidation, pleural effusion, or pneumothorax. No acute osseous
abnormality.
IMPRESSION: No active cardiopulmonary disease.

## 2019-09-21 IMAGING — CR DG CHEST 2V
2 series · 2 of 2 positions shown · non-contrast
Comparison: 01/28/2018.  01/23/2018.

CLINICAL DATA: Productive cough over the last 3 weeks.

EXAM:
CHEST  2 VIEW

[chest lat]
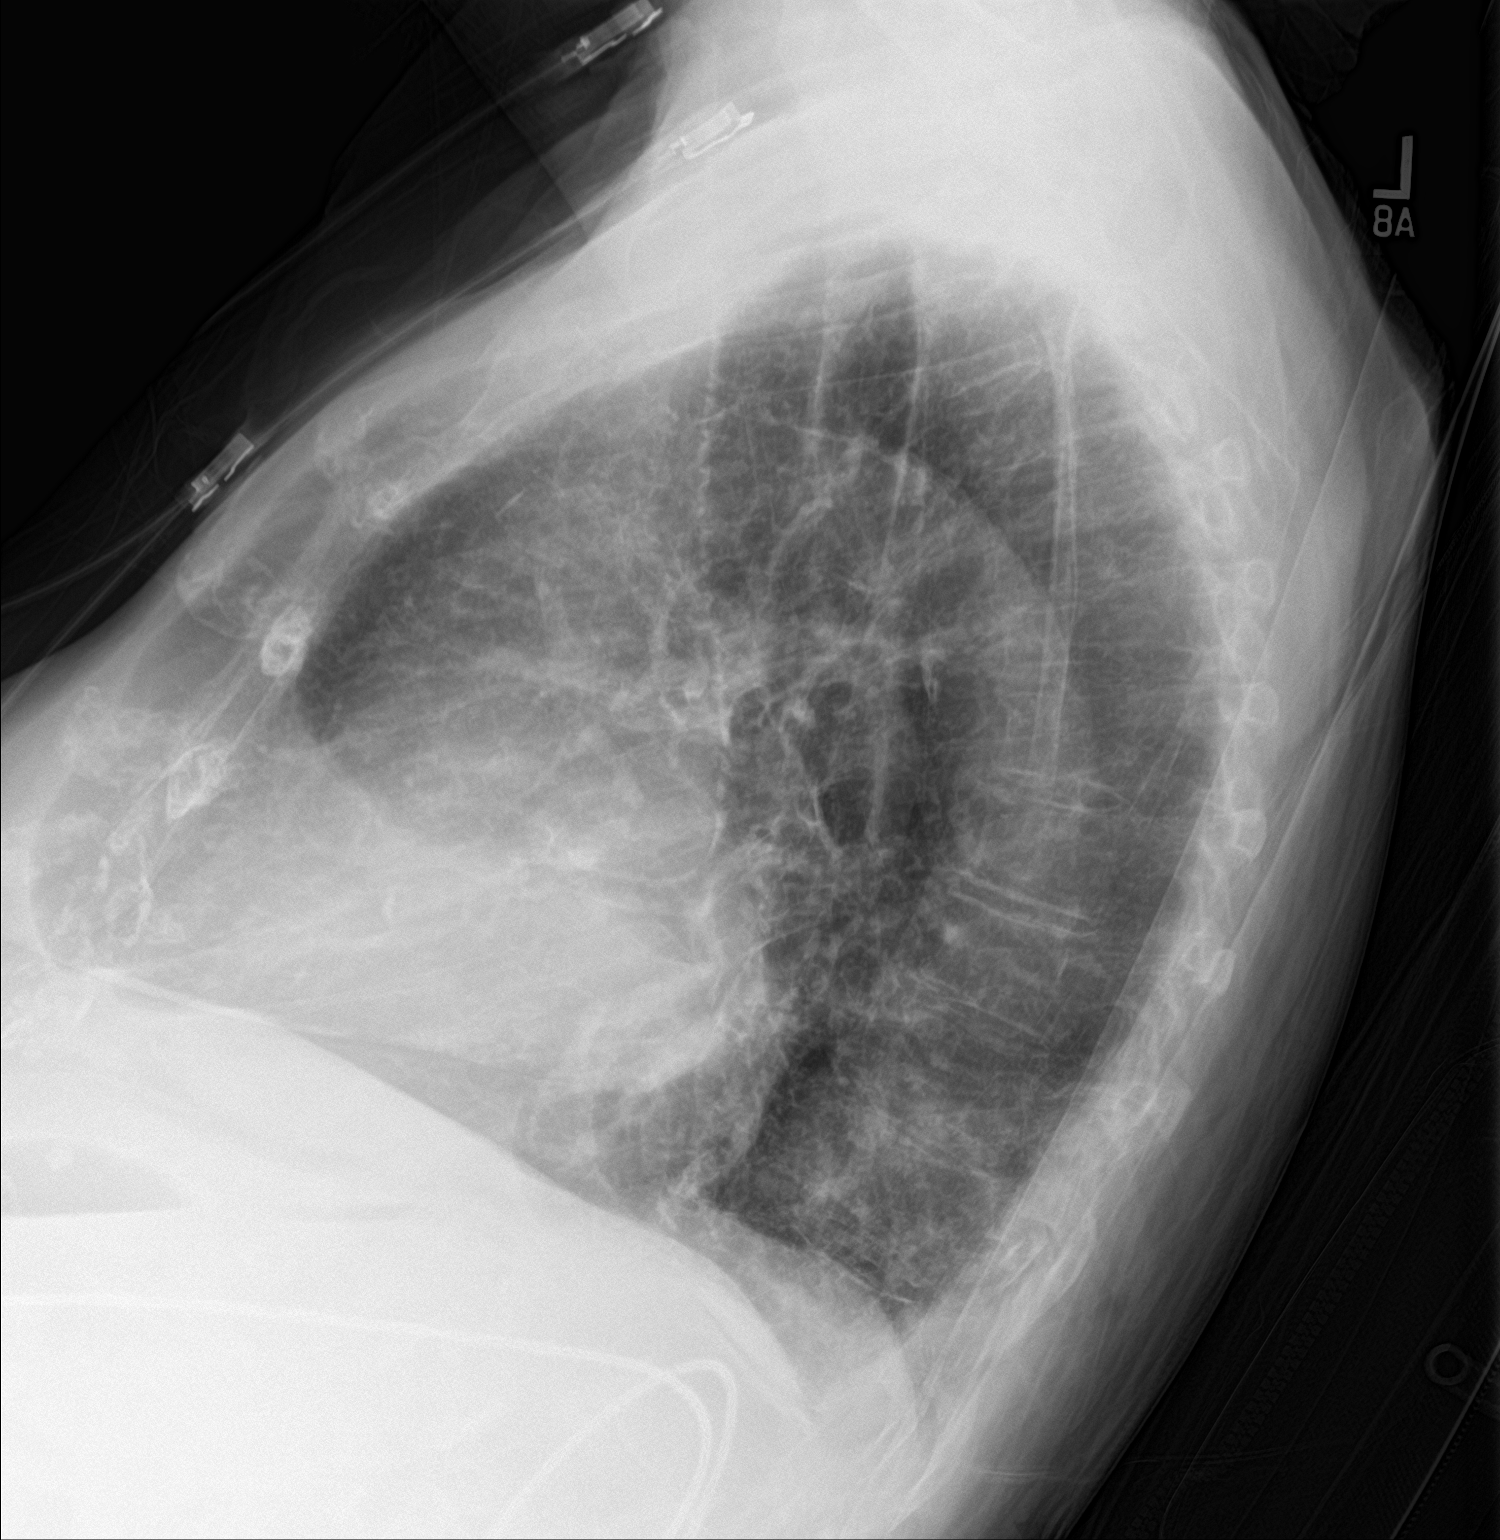

[chest ap]
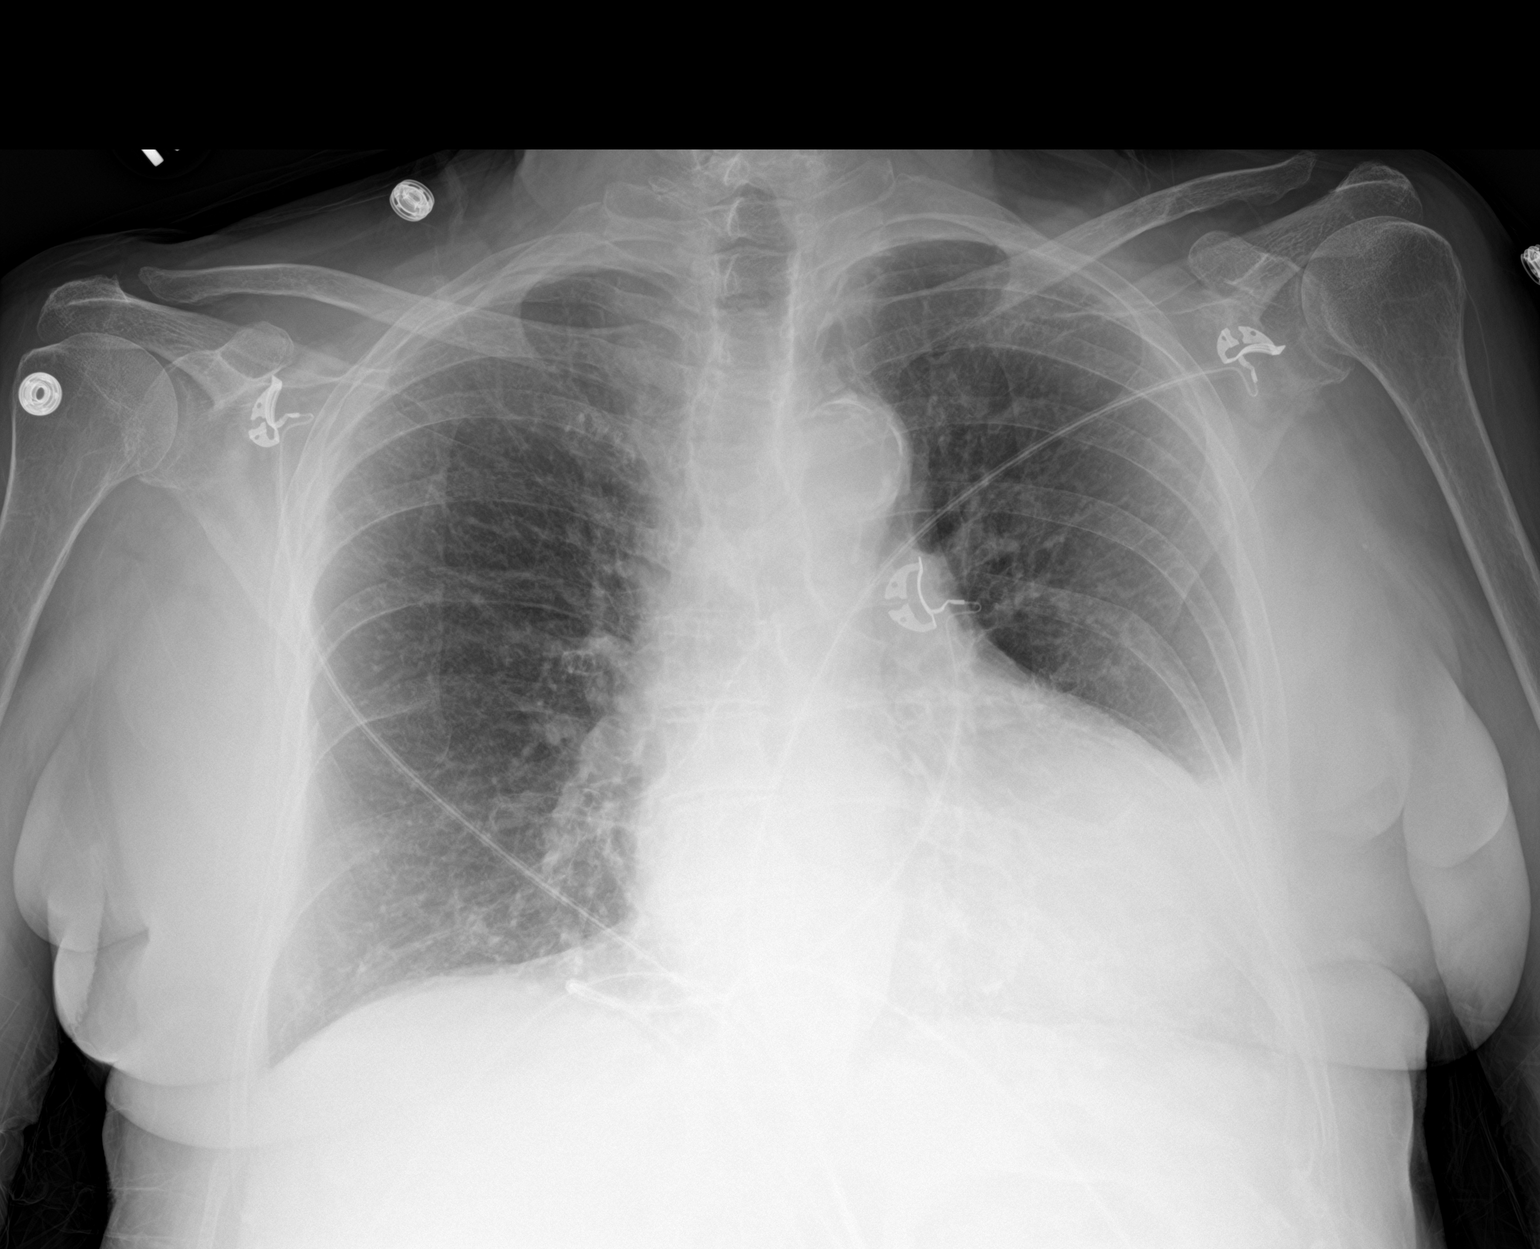

[2 of 2 positions shown; findings below may reference images not displayed]

FINDINGS: Chronic mild cardiomegaly. Chronic aortic atherosclerosis. Chronic
pulmonary lung markings. Worsened markings in the left lower lobe
consistent with worsened pneumonia. This is particularly evident
overlying the spine on the lateral view. No effusions. No acute bone
finding.
IMPRESSION: Worsening left lower lobe pneumonia.

Cardiomegaly and aortic atherosclerosis.

## 2019-09-25 ENCOUNTER — Other Ambulatory Visit: Payer: Self-pay

## 2019-09-26 ENCOUNTER — Ambulatory Visit (INDEPENDENT_AMBULATORY_CARE_PROVIDER_SITE_OTHER): Payer: PPO | Admitting: Family Medicine

## 2019-09-26 VITALS — BP 140/73 | HR 72 | Temp 97.8°F | Ht 67.0 in | Wt 161.0 lb

## 2019-09-26 DIAGNOSIS — Z23 Encounter for immunization: Secondary | ICD-10-CM

## 2019-09-26 DIAGNOSIS — I482 Chronic atrial fibrillation, unspecified: Secondary | ICD-10-CM

## 2019-09-26 LAB — COAGUCHEK XS/INR WAIVED
INR: 2.1 — ABNORMAL HIGH (ref 0.9–1.1)
Prothrombin Time: 24.7 s

## 2019-09-26 NOTE — Progress Notes (Signed)
Subjective: CC: INR check PCP: Adriana Norlander, DO MU:[id number] E Zylka is a 85 y.o. female presenting to clinic today for:  1.  Chronic anticoagulation for atrial fibrillation and history of DVT Patient with known chronic atrial fibrillation with chronic anticoagulation on Coumadin.  Goal INR is 2-3.    Patient was seen in August for INR check and INR was therapeutic at 2.3. She was instructed to continue use of Coumadin 6 mg on Mondays, Wednesdays and Fridays with 3 mg all other days. Denies any melena, hematochezia, vaginal bleeding, hematuria.  ROS: Per HPI  Allergies  Allergen Reactions  . Contrast Media [Iodinated Diagnostic Agents] Hives  . Amoxicillin Itching  . Ampicillin Itching  . Levaquin [Levofloxacin] Itching and Rash  . Penicillins Rash    "haven't had it in years"  . Vancomycin Itching and Rash   Past Medical History:  Diagnosis Date  . AAA (abdominal aortic aneurysm) ([place])   . Allergy   . Anxiety   . Bladder cancer ([place]) dx'd 2011   Chronic microscopic hematuria; transitional cell cancer  . Blood transfusion 1960   "related to hysterectomy"  . Chronic atrial fibrillation    Anticoagulated with warfarin, rate control with diltiazem and Toprol  . Chronic back pain   . Chronic headache    "probably 2/wk" (08/28/2015)  . Concussion w/o coma 99991111   Complicated by subarachnoid hemorrhage.  "even now has times when she's not able to comprehend" (05/08/12)  . Coronary artery disease    Nonobstructive  . Diverticulitis   . Diverticulosis   . DVT (deep venous thrombosis), right 2005   "right calf after 8 foot fall"  . Edema of both legs    Chronic, thought to be secondary to DVTs  . External hemorrhoids   . Family history of adverse reaction to anesthesia 2012   daughter "had OR for crushed hand; had problems w/anesthesia & I was in there all day long" (  . Fatty liver   . Frequent UTI   . GERD (gastroesophageal reflux disease)   . H/O hiatal  hernia   . High cholesterol   . Migraine    "rare now" (08/28/2015)  . Pneumonia ~ 2010;  2013; 07/2015  . Rheumatoid arthritis(714.0)    "hands" (08/28/2015)  . Urinary frequency     Current Outpatient Medications:  .  atorvastatin (LIPITOR) 10 MG tablet, Take 10 mg by mouth daily., Disp: , Rfl:  .  bacitracin-neomycin-polymyxin-hydrocortisone (CORTISPORIN) 1 % ophthalmic ointment, Place 1 application into the left eye 3 (three) times daily., Disp: 3.5 g, Rfl: 0 .  benzonatate (TESSALON PERLES) 100 MG capsule, Take 1 capsule (100 mg total) by mouth 3 (three) times daily as needed for cough., Disp: 20 capsule, Rfl: 0 .  calcium carbonate (OS-CAL - DOSED IN MG OF ELEMENTAL CALCIUM) 1250 MG tablet, Take 1 tablet by mouth every morning. , Disp: , Rfl:  .  cholecalciferol (VITAMIN D) 1000 UNITS tablet, Take 1,000 Units by mouth daily., Disp: , Rfl:  .  diclofenac sodium (VOLTAREN) 1 % GEL, Apply 4 g topically 4 (four) times daily., Disp: 300 g, Rfl: 1 .  diltiazem (CARDIZEM CD) 240 MG 24 hr capsule, Take 1 capsule (240 mg total) by mouth daily., Disp: 30 capsule, Rfl: 0 .  furosemide (LASIX) 20 MG tablet, Take 1 tablet (20 mg total) by mouth daily. (Patient taking differently: Take 20-40 mg by mouth daily. Take one tablet (20MG ) by mouth daily except on Mon, Wed, Friday  take 2 tablets (40 MG)), Disp: 30 tablet, Rfl: 0 .  loratadine (CLARITIN) 10 MG tablet, TAKE 1 TABLET BY MOUTH ONCE DAILY FOR ALLERGY, Disp: , Rfl:  .  Multiple Vitamins-Minerals (MULTIVITAMINS THER. W/MINERALS) TABS, Take 1 tablet by mouth daily. , Disp: , Rfl:  .  Omega-3 Fatty Acids (FISH OIL) 1000 MG CAPS, Take 1,000 mg by mouth daily., Disp: , Rfl:  .  omeprazole (PRILOSEC) 20 MG capsule, TAKE 1 CAPSULE BY MOUTH TWICE A DAY, Disp: 180 capsule, Rfl: 2 .  PROAIR HFA 108 (90 Base) MCG/ACT inhaler, TAKE 2 PUFFS BY MOUTH EVERY 6 HOURS AS NEEDED FOR WHEEZE OR SHORTNESS OF BREATH, Disp: 8.5 Inhaler, Rfl: 4 .  warfarin (COUMADIN) 3 MG  tablet, Take 2 tablets (6mg ) on Mondays, Wednesdays, Fridays, and Saturdays and 1 tablet (3mg ) on Tuesdays, Thursdays, and Sundays, Disp: 60 tablet, Rfl: 3 Social History   Socioeconomic History  . Marital status: Widowed    Spouse name: Not on file  . Number of children: 3  . Years of education: Not on file  . Highest education level: 8th grade  Occupational History  . Occupation: Retired  Scientific laboratory technician  . Financial resource strain: Not hard at all  . Food insecurity    Worry: Never true    Inability: Never true  . Transportation needs    Medical: No    Non-medical: No  Tobacco Use  . Smoking status: Former Smoker    Packs/day: 1.00    Years: 40.00    Pack years: 40.00    Types: Cigarettes    Quit date: 11/07/1982    Years since quitting: 36.9  . Smokeless tobacco: Former Systems developer    Types: Snuff  Substance and Sexual Activity  . Alcohol use: Yes    Alcohol/week: 7.0 standard drinks    Types: 7 Glasses of wine per week    Comment: 1 glass of wine prior to bed  . Drug use: No  . Sexual activity: Not Currently    Birth control/protection: Surgical  Lifestyle  . Physical activity    Days per week: 0 days    Minutes per session: 0 min  . Stress: Only a little  Relationships  . Social connections    Talks on phone: More than three times a week    Gets together: More than three times a week    Attends religious service: More than 4 times per year    Active member of club or organization: No    Attends meetings of clubs or organizations: Never    Relationship status: Widowed  . Intimate partner violence    Fear of current or ex partner: No    Emotionally abused: No    Physically abused: No    Forced sexual activity: No  Other Topics Concern  . Not on file  Social History Narrative   She is a widowed mother of 69, grandmother of 61, great grandmother of 80. She does not   really get routine exercise. She quit smoking in 1983. She does not smoke and does not use illicit  drugs and does not drink.   Family History  Problem Relation Age of Onset  . Anesthesia problems Daughter   . Cancer Mother        uterine  . Cancer Sister        leukemia  . Cancer Brother   . Prostate cancer Brother   . Cancer Brother   . Breast cancer Other  neice  . Heart disease Brother   . Cancer Brother   . Heart attack Sister   . Colon cancer Neg Hx     Objective: Office vital signs reviewed. BP 140/73   Pulse 72   Temp 97.8 F (36.6 C) (Oral)   Ht 5\' 7"  (1.702 m)   Wt 161 lb (73 kg)   SpO2 99%   BMI 25.22 kg/m   Physical Examination:  General: Awake, alert, well nourished, well appearing. No acute distress HEENT: Sclera white.  MMM Cardio: Irregularly irregular.  Rate controlled.  S1S2 heard, no murmurs appreciated Pulm: Clear to auscultation bilaterally.  Normal work of breathing on room air.  No wheezes, rhonchi or rales.  Assessment/ Plan: 85 y.o. female   1. Chronic atrial fibrillation (HCC) Continue current Coumadin regimen.  She can follow-up in 4 to 6 weeks for recheck. - CoaguChek XS/INR Waived  2. Need for immunization against influenza Administered during today's visit. - Flu Vaccine QUAD High Dose(Fluad)   Orders Placed This Encounter  Procedures  . CoaguChek XS/INR Waived   No orders of the defined types were placed in this encounter.    Adriana Norlander, DO [place] [phone]

## 2019-10-09 DIAGNOSIS — H02889 Meibomian gland dysfunction of unspecified eye, unspecified eyelid: Secondary | ICD-10-CM | POA: Diagnosis not present

## 2019-10-09 DIAGNOSIS — H0015 Chalazion left lower eyelid: Secondary | ICD-10-CM | POA: Diagnosis not present

## 2019-10-09 DIAGNOSIS — H353131 Nonexudative age-related macular degeneration, bilateral, early dry stage: Secondary | ICD-10-CM | POA: Diagnosis not present

## 2019-10-10 DIAGNOSIS — Z8551 Personal history of malignant neoplasm of bladder: Secondary | ICD-10-CM | POA: Diagnosis not present

## 2019-10-23 ENCOUNTER — Telehealth: Payer: Self-pay | Admitting: *Deleted

## 2019-10-23 ENCOUNTER — Telehealth (INDEPENDENT_AMBULATORY_CARE_PROVIDER_SITE_OTHER): Payer: PPO | Admitting: Cardiology

## 2019-10-23 ENCOUNTER — Encounter: Payer: Self-pay | Admitting: Cardiology

## 2019-10-23 VITALS — BP 125/84 | HR 98 | Ht 64.0 in | Wt 161.0 lb

## 2019-10-23 DIAGNOSIS — I10 Essential (primary) hypertension: Secondary | ICD-10-CM | POA: Diagnosis not present

## 2019-10-23 DIAGNOSIS — R600 Localized edema: Secondary | ICD-10-CM

## 2019-10-23 DIAGNOSIS — I482 Chronic atrial fibrillation, unspecified: Secondary | ICD-10-CM

## 2019-10-23 DIAGNOSIS — I714 Abdominal aortic aneurysm, without rupture, unspecified: Secondary | ICD-10-CM

## 2019-10-23 DIAGNOSIS — R072 Precordial pain: Secondary | ICD-10-CM | POA: Diagnosis not present

## 2019-10-23 NOTE — Assessment & Plan Note (Signed)
Blood pressure well controlled current meds.  No change.

## 2019-10-23 NOTE — Assessment & Plan Note (Signed)
Sharp intermittent chest pains - occurring at rest - do not sound cardiac in nature.  However, her daughter was quite concerned, triggering a prolonged discussion about goals of care.  Essentially, her daughter (who is [place]'s healthcare power of attorney) would not want to have any invasive type procedures done such as heart catheterizations for AAA repair etc.  As such, we made the decision that doing test that would potentially lead to this would therefore not be reasonable.  For instance, doing a nuclear stress test to evaluate chest pain when the plan is to simply treat medically would only serve to confuse issues. As such, the plan will be if she has more recurrent symptoms such as the concerning symptom this weekend, we would simply add long-acting nitrate such as Imdur 30 mg daily.

## 2019-10-23 NOTE — Telephone Encounter (Signed)
Spoke to daughter - patient was able to complete virtual visit today .    INSTRUCTION WAS GIVEN FROM TODAY'S VIRTUAL VISIT.  AVS SUMMARY HAS BEEN MAILED .  PATIENT'S DAUGHTER [facility]) VERBALIZED UNDERSTANDING.

## 2019-10-23 NOTE — Assessment & Plan Note (Signed)
Stable.  Rate controlled with diltiazem.  No signs or symptoms of rapid AF. Anticoagulated with warfarin.  Stable.

## 2019-10-23 NOTE — Progress Notes (Signed)
Virtual Visit via Telephone Note   This visit type was conducted due to national recommendations for restrictions regarding the COVID-19 Pandemic (e.g. social distancing) in an effort to limit this patient's exposure and mitigate transmission in our community.  Due to her co-morbid illnesses, this patient is at least at moderate risk for complications without adequate follow up.  This format is felt to be most appropriate for this patient at this time.  The patient did not have access to video technology/had technical difficulties with video requiring transitioning to audio format only (telephone).  All issues noted in this document were discussed and addressed.  No physical exam could be performed with this format.  Please refer to the patient's chart for her  consent to telehealth for [facility].   Patient has given verbal permission to conduct this visit via virtual appointment and to bill insurance 10/23/2019 10:27 AM     Evaluation Performed:  Follow-up visit  Date:  10/23/2019   ID:  [name], DOB 30-Jan-1921, MRN [id number]  Patient Location: Home Provider Location: Home  PCP:  Janora Norlander, DO  Cardiologist:  Glenetta Hew, MD  Electrophysiologist:  None   Chief Complaint:   Chief Complaint  Patient presents with  . Follow-up    AAA Duplex f/u  . Atrial Fibrillation    History of Present Illness:    [name] is a 85 y.o. female with PMH notable for chronic/persistent atrial fibrillation (rate controlled with Diltiazem, on warfarin with INRs checked at PCPs office) with HFpEF (on low-dose Lasix) who presents via audio/video conferencing for a telehealth visit today.    LAPORSCHE HOEGER was last seen by telemedicine visit in April 2020 at the very beginning of the COVID-19 lockdown.  She had actually just recovered from a cold and had a little of remaining cough but otherwise is doing fine.  States that she may note is skipping palpitations  every once in a while, but not fast or prolonged.  Gets short of breath maybe if she overexerts herself, but not with routine activity.  Edema stable.  Taking Lasix 40 mg on Monday Wednesday and Friday, 20 mg on other days. Noted one episode of near syncope around Thanksgiving.  Blood pressure was low.  Had normal work-up. --> AAA duplex ordered.  Hospitalizations:  . None   Prior CV studies:   The following studies were reviewed today: . AAA Duplex 08/01/2019: mild Abdominal Aortic dilation - ~ 3.1 cm.  Also noted to abnormal dilation of the right common iliac and left common iliac arteries. --Standard recommendation is 83-monthfollow-up, however at age 85 likely not necessary.  Inerval History   Adriana GORDYseems to be doing relatively well - just a bit tired of "sitting around all day" since COVID-19.  Does exercises in the house - for her knee.  Loves to sit out on the porch & look at nature. A little concerned about walking around in the yard - unlevel, so with her poor balance, hard to walk on it. Goes out to get hair done every few weeks.   Still has off & on "flip-flopping" skipping in her chest. - nothing new  Last week - was sitting on the couch - had a few spells of Chest Discomfort.  None this week. Sharp pains lasting a few minutes at a time. Sx went away with out any Rx.  Daughter noted that she had a strange look on her face. Last episode was  over the weekend. Also associated with "top of her abdomen / stomach" was hurting her.   Getting a bit more SOB than before - simply walking from in to outside of the house.  Just not really doing much of anything.  Not getting much walking - only knee exercises. Tires out quickly -- once she gets to her destination - puffs out.   No PND/orthopnea. No edema. No syncope or near syncope - but has had some "lightheadedness" / dizzy.  Balance is off.  Using cane before COVID - but worse since.  Has had some falls.  Better to use walker  - if she does walk. Also very much bothered by Knee OA.   ROS:  Please see the history of present illness.    The patient does not have symptoms concerning for COVID-19 infection (fever, chills, cough, or new shortness of breath).  Review of Systems  Constitutional: Positive for malaise/fatigue.  Respiratory: Positive for shortness of breath.   Cardiovascular: Negative for leg swelling.  Gastrointestinal: Negative for blood in stool and melena.  Genitourinary: Negative for hematuria.  Musculoskeletal: Positive for falls (not lately) and joint pain.  Neurological: Positive for dizziness (unsteady bablance).  Psychiatric/Behavioral: Negative for memory loss. The patient is not nervous/anxious and does not have insomnia.    The patient is practicing social distancing.  Past Medical History:  Diagnosis Date  . AAA (abdominal aortic aneurysm) ([name])   . Allergy   . Anxiety   . Bladder cancer ([name]) dx'd 2011   Chronic microscopic hematuria; transitional cell cancer  . Blood transfusion 1960   "related to hysterectomy"  . Chronic atrial fibrillation (HCC)    Anticoagulated with warfarin, rate control with diltiazem and Toprol  . Chronic back pain   . Chronic headache    "probably 2/wk" (08/28/2015)  . Concussion w/o coma 07/24/7516   Complicated by subarachnoid hemorrhage.  "even now has times when she's not able to comprehend" (05/08/12)  . Coronary artery disease    Nonobstructive  . Diverticulitis   . Diverticulosis   . DVT (deep venous thrombosis), right 2005   "right calf after 8 foot fall"  . Edema of both legs    Chronic, thought to be secondary to DVTs  . External hemorrhoids   . Family history of adverse reaction to anesthesia 2012   daughter "had OR for crushed hand; had problems w/anesthesia & I was in there all day long" (  . Fatty liver   . Frequent UTI   . GERD (gastroesophageal reflux disease)   . H/O hiatal hernia   . High cholesterol   . Migraine    "rare now"  (08/28/2015)  . Pneumonia ~ 2010;  2013; 07/2015  . Rheumatoid arthritis(714.0)    "hands" (08/28/2015)  . Urinary frequency    Past Surgical History:  Procedure Laterality Date  . BREAST CYST EXCISION Left 1960's?   2 cysts; benign  . CARDIAC CATHETERIZATION  1980's  . CATARACT EXTRACTION W/ INTRAOCULAR LENS  IMPLANT, BILATERAL Bilateral 1992  . CYSTOSCOPY  11/11/2011   Procedure: CYSTOSCOPY;  Surgeon: Malka So;  Location: [facility];  Service: Urology;  Laterality: N/A;  . CYSTOSTOMY W/ BLADDER BIOPSY  10/08/2013  . INCONTINENCE SURGERY  1980's  . JOINT REPLACEMENT    . KNEE ARTHROSCOPY Right 2005   S/P fall  . Lower Extremity Venous Doppler  11/10/2013   No DVT or superficial thrombus enlarged inguinal lymph node noted in the right. No Baker's cyst.  .  TONSILLECTOMY  1952  . TOTAL KNEE ARTHROPLASTY Right 02/05/2013   Procedure: TOTAL KNEE ARTHROPLASTY;  Surgeon: Mauri Pole, MD;  Location: [facility];  Service: Orthopedics;  Laterality: Right;  . TRANSTHORACIC ECHOCARDIOGRAM  01/'15; 4/'19   a) EF 60-65%. NO RWMA. Ao Sclerosis. MAC - no MS with mild MR>  B Atriae mildly dilated;; b)  EF 55-60%. AoV Sclerosis (no stenosis).  Trivial AI & MR. Mild LAE & Severe RAE.. Mild RV dilation  . TRANSURETHRAL RESECTION OF BLADDER TUMOR  11/11/2011   Procedure: TRANSURETHRAL RESECTION OF BLADDER TUMOR (TURBT);  Surgeon: Malka So;  Location: [facility];  Service: Urology;  Laterality: N/A;  Cysto, Bladder Biopsy, TURBT with Gyrus,   . TRANSURETHRAL RESECTION OF BLADDER TUMOR WITH GYRUS (TURBT-GYRUS)  2007; 2009; 2010   "for tumors on surface of bladder"  . TUMOR EXCISION  1976; 1980's   "fatty tumor cut off her upper back; left thumb"  . VAGINAL HYSTERECTOMY  1960   partial      Current Meds  Medication Sig  . atorvastatin (LIPITOR) 10 MG tablet Take 10 mg by mouth daily.  . calcium carbonate (OS-CAL - DOSED IN MG OF ELEMENTAL CALCIUM) 1250 MG tablet Take 1 tablet by mouth every morning.   .  cholecalciferol (VITAMIN D) 1000 UNITS tablet Take 1,000 Units by mouth daily.  Marland Kitchen diltiazem (CARDIZEM CD) 240 MG 24 hr capsule Take 1 capsule (240 mg total) by mouth daily.  . furosemide (LASIX) 20 MG tablet Take 1 tablet (20 mg total) by mouth daily. (Patient taking differently: Take 20-40 mg by mouth daily. Take one tablet (20MG) by mouth daily except on Mon, Wed, Friday take 2 tablets (40 MG))  . loratadine (CLARITIN) 10 MG tablet TAKE 1 TABLET BY MOUTH ONCE DAILY FOR ALLERGY  . Multiple Vitamins-Minerals (MULTIVITAMINS THER. W/MINERALS) TABS Take 1 tablet by mouth daily.   . Multiple Vitamins-Minerals (PRESERVISION AREDS 2+MULTI VIT PO) Take by mouth.  . Omega-3 Fatty Acids (FISH OIL) 1000 MG CAPS Take 1,000 mg by mouth daily.  Marland Kitchen omeprazole (PRILOSEC) 20 MG capsule TAKE 1 CAPSULE BY MOUTH TWICE A DAY  . PROAIR HFA 108 (90 Base) MCG/ACT inhaler TAKE 2 PUFFS BY MOUTH EVERY 6 HOURS AS NEEDED FOR WHEEZE OR SHORTNESS OF BREATH  . warfarin (COUMADIN) 3 MG tablet Take 2 tablets (35m) on Mondays, Wednesdays, Fridays, and Saturdays and 1 tablet (378m on Tuesdays, Thursdays, and Sundays     Allergies:   Contrast media [iodinated diagnostic agents], Amoxicillin, Ampicillin, Cortisol [hydrocortisone], Levaquin [levofloxacin], Penicillins, and Vancomycin   Social History   Tobacco Use  . Smoking status: Former Smoker    Packs/day: 1.00    Years: 40.00    Pack years: 40.00    Types: Cigarettes    Quit date: 11/07/1982    Years since quitting: 36.9  . Smokeless tobacco: Former [employer] developer  Types: Snuff  Substance Use Topics  . Alcohol use: Yes    Alcohol/week: 7.0 standard drinks    Types: 7 Glasses of wine per week    Comment: 1 glass of wine prior to bed  . Drug use: No     Family Hx: The patient's family history includes Anesthesia problems in her daughter; Breast cancer in an other family member; Cancer in her brother, brother, brother, mother, and sister; Heart attack in her sister; Heart  disease in her brother; Prostate cancer in her brother. There is no history of Colon cancer.   Labs/Other Tests and  Data Reviewed:    EKG:  No ECG reviewed.  Recent Labs: 02/28/2019: BUN 9; Creatinine, Ser 0.65; Hemoglobin 13.6; Platelets 228; Potassium 4.2; Sodium 141   Recent Lipid Panel No results found for: CHOL, TRIG, HDL, CHOLHDL, LDLCALC, LDLDIRECT  Wt Readings from Last 3 Encounters:  10/23/19 161 lb (73 kg)  09/26/19 161 lb (73 kg)  06/29/19 159 lb (72.1 kg)     Objective:    Vital Signs:  BP 125/84   Pulse 98   Ht _0  (1.626 m)   Wt 161 lb (73 kg)   BMI 27.64 kg/m   VITAL SIGNS:  reviewed Pleasant elderly female in now acute distress. A&O x 3.  Normal Mood & Affect Non-labored respirations   ASSESSMENT & PLAN:    Problem List Items Addressed This Visit    Essential hypertension (Chronic)   Chronic atrial fibrillation (HCC) (Chronic)   AAA (abdominal aortic aneurysm) (HCC) (Chronic)    Stable to improved Duplex      Precordial pain - Primary      COVID-19 Education: The signs and symptoms of COVID-19 were discussed with the patient and how to seek care for testing (follow up with PCP or arrange E-visit).   The importance of social distancing was discussed today.  Time:   Today, I have spent 34 minutes with the patient with telehealth technology discussing the above problems.  Discussion of chest pain triggered prolonged goals of care discussion.   Medication Adjustments/Labs and Tests Ordered: Current medicines are reviewed at length with the patient today.  Concerns regarding medicines are outlined above.  Patient Instructions  Medication Instructions:   None for now  *If you need a refill on your cardiac medications before your next appointment, please call your pharmacy*  Lab Work: n/a  If you have labs (blood work) drawn today and your tests are completely normal, you will receive your results only by: Marland Kitchen MyChart Message (if you have  MyChart) OR . A paper copy in the mail If you have any lab test that is abnormal or we need to change your treatment, we will call you to review the results.  Testing/Procedures:  none  Follow-Up: At [facility], you and your health needs are our priority.  As part of our continuing mission to provide you with exceptional heart care, we have created designated Provider Care Teams.  These Care Teams include your primary Cardiologist (physician) and Advanced Practice Providers (APPs -  Physician Assistants and Nurse Practitioners) who all work together to provide you with the care you need, when you need it.  Your next appointment:   6 months  The format for your next appointment:   In Person  Provider:   Glenetta Hew, MD  Other Instructions Keep staying active.    If more episodes of chest discomfort, will consider adding Imdur (a long acting Nitroglycerin medication).     Signed, Glenetta Hew, MD  10/23/2019 10:27 AM    [place]

## 2019-10-23 NOTE — Assessment & Plan Note (Addendum)
Stable to improved Duplex.  Given advanced age & no plans for invasive procedures, would not re-order.

## 2019-10-23 NOTE — Assessment & Plan Note (Signed)
Chronic venous stasis and history of DVTs.  Is now on warfarin for both the DVTs and A. fib.  Less likely to develop DVTs. Continue to recommend support stockings.  Pretty well controlled on current doses of Lasix.

## 2019-10-23 NOTE — Patient Instructions (Addendum)
Medication Instructions:   None for now  *If you need a refill on your cardiac medications before your next appointment, please call your pharmacy*  Lab Work: n/a    Testing/Procedures:  none  Follow-Up: At [facility], you and your health needs are our priority.  As part of our continuing mission to provide you with exceptional heart care, we have created designated Provider Care Teams.  These Care Teams include your primary Cardiologist (physician) and Advanced Practice Providers (APPs -  Physician Assistants and Nurse Practitioners) who all work together to provide you with the care you need, when you need it.  Your next appointment:   6 months-May 2021  The format for your next appointment:   In Person  Provider:   Glenetta Hew, MD  Other Instructions Keep staying active.    If more episodes of chest discomfort, will consider adding Imdur (a long acting Nitroglycerin medication).

## 2019-10-23 NOTE — Progress Notes (Signed)
ASSESSMENT & PLAN  AAA (abdominal aortic aneurysm) (HCC) Stable to improved Duplex.  Given advanced age & no plans for invasive procedures, would not re-order.   Precordial pain Sharp intermittent chest pains - occurring at rest - do not sound cardiac in nature.  However, her daughter was quite concerned, triggering a prolonged discussion about goals of care.  Essentially, her daughter (who is [name]'s healthcare power of attorney) would not want to have any invasive type procedures done such as heart catheterizations for AAA repair etc.  As such, we made the decision that doing test that would potentially lead to this would therefore not be reasonable.  For instance, doing a nuclear stress test to evaluate chest pain when the plan is to simply treat medically would only serve to confuse issues. As such, the plan will be if she has more recurrent symptoms such as the concerning symptom this weekend, we would simply add long-acting nitrate such as Imdur 30 mg daily.  Edema of both legs Chronic venous stasis and history of DVTs.  Is now on warfarin for both the DVTs and A. fib.  Less likely to develop DVTs. Continue to recommend support stockings.  Pretty well controlled on current doses of Lasix.  Essential hypertension Blood pressure well controlled current meds.  No change.  Chronic atrial fibrillation (HCC) Stable.  Rate controlled with diltiazem.  No signs or symptoms of rapid AF. Anticoagulated with warfarin.  Stable.

## 2019-10-23 NOTE — Telephone Encounter (Signed)
Called no answer - left message - calling to start virtual - telephone visit

## 2019-11-01 ENCOUNTER — Other Ambulatory Visit: Payer: Self-pay

## 2019-11-01 ENCOUNTER — Encounter: Payer: Self-pay | Admitting: Family

## 2019-11-01 ENCOUNTER — Ambulatory Visit (INDEPENDENT_AMBULATORY_CARE_PROVIDER_SITE_OTHER): Payer: PPO | Admitting: Family

## 2019-11-01 VITALS — BP 169/72 | HR 92 | Temp 97.5°F | Ht 64.0 in | Wt 162.4 lb

## 2019-11-01 DIAGNOSIS — I10 Essential (primary) hypertension: Secondary | ICD-10-CM | POA: Diagnosis not present

## 2019-11-01 DIAGNOSIS — I482 Chronic atrial fibrillation, unspecified: Secondary | ICD-10-CM | POA: Diagnosis not present

## 2019-11-01 DIAGNOSIS — Z7901 Long term (current) use of anticoagulants: Secondary | ICD-10-CM | POA: Diagnosis not present

## 2019-11-01 LAB — COAGUCHEK XS/INR WAIVED
INR: 2.8 — ABNORMAL HIGH (ref 0.9–1.1)
Prothrombin Time: 33.9 s

## 2019-11-01 NOTE — Patient Instructions (Signed)
Description   Continue taking 2 tablets (6mg ) on Monday, Wednesdays and Fridays and take 1 tablet (3mg ) all other days of the week.  INR today is 2.8 (goal is 2-3)  Perfect reading today

## 2019-11-01 NOTE — Progress Notes (Signed)
   Subjective:    Patient ID: [name], female    DOB: 04/19/1921, 85 y.o.   MRN: [id number]  Chief Complaint  Patient presents with  . protime recheck   PT presents to the office today for INR check. Her INR today is 2.8. She is currently taking warfarin for A Fib. Denies any missed doses, bleeding, or changes in diet or medications. Reports mild bruising. See anticoagulation flow sheet.  Hypertension This is a chronic problem. The current episode started more than 1 year ago. The problem has been waxing and waning since onset. The problem is uncontrolled. Associated symptoms include peripheral edema. Pertinent negatives include no malaise/fatigue or shortness of breath. Past treatments include calcium channel blockers. The current treatment provides mild improvement.      Review of Systems  Constitutional: Negative for malaise/fatigue.  Respiratory: Negative for shortness of breath.   All other systems reviewed and are negative.      Objective:   Physical Exam Vitals signs reviewed.  Constitutional:      General: She is not in acute distress.    Appearance: She is well-developed.  HENT:     Head: Normocephalic and atraumatic.  Eyes:     Pupils: Pupils are equal, round, and reactive to light.  Neck:     Musculoskeletal: Normal range of motion and neck supple.     Thyroid: No thyromegaly.  Cardiovascular:     Rate and Rhythm: Normal rate and regular rhythm.     Heart sounds: Normal heart sounds. No murmur.  Pulmonary:     Effort: Pulmonary effort is normal. No respiratory distress.     Breath sounds: Normal breath sounds. No wheezing.  Abdominal:     General: Bowel sounds are normal. There is no distension.     Palpations: Abdomen is soft.     Tenderness: There is no abdominal tenderness.  Musculoskeletal: Normal range of motion.        General: No tenderness.     Right lower leg: Edema (2+) present.  Skin:    General: Skin is warm and dry.  Neurological:      Mental Status: She is alert and oriented to person, place, and time.     Cranial Nerves: No cranial nerve deficit.     Deep Tendon Reflexes: Reflexes are normal and symmetric.  Psychiatric:        Behavior: Behavior normal.        Thought Content: Thought content normal.        Judgment: Judgment normal.       BP (!) 169/72   Pulse 92   Temp (!) 97.5 F (36.4 C) (Temporal)   Ht 5\' 4"  (1.626 m)   Wt 162 lb 6.4 oz (73.7 kg)   SpO2 97%   BMI 27.88 kg/m      Assessment & Plan:  [name] comes in today with chief complaint of protime recheck   Diagnosis and orders addressed:  1. Chronic atrial fibrillation (HCC) Description   Continue taking 2 tablets (6mg ) on Monday, Wednesdays and Fridays and take 1 tablet (3mg ) all other days of the week.  INR today is 2.8 (goal is 2-3)  Perfect reading today    RTO in 4 weeks to recheck - inr FINGERSTICK  2. Essential hypertension Stable on last visit, will hold off on medication changes at this time  3. Chronic anticoagulation     Evelina Dun, FNP

## 2019-11-19 DIAGNOSIS — I10 Essential (primary) hypertension: Secondary | ICD-10-CM | POA: Diagnosis not present

## 2019-11-19 DIAGNOSIS — R7309 Other abnormal glucose: Secondary | ICD-10-CM | POA: Diagnosis not present

## 2019-12-17 ENCOUNTER — Emergency Department (HOSPITAL_COMMUNITY): Payer: PPO

## 2019-12-17 ENCOUNTER — Other Ambulatory Visit: Payer: Self-pay

## 2019-12-17 ENCOUNTER — Emergency Department (HOSPITAL_COMMUNITY)
Admission: EM | Admit: 2019-12-17 | Discharge: 2019-12-18 | Disposition: A | Discharge status: Home / Self Care | Payer: PPO | Attending: Emergency Medicine | Admitting: Emergency Medicine

## 2019-12-17 DIAGNOSIS — Y9389 Activity, other specified: Secondary | ICD-10-CM | POA: Insufficient documentation

## 2019-12-17 DIAGNOSIS — S92324A Nondisplaced fracture of second metatarsal bone, right foot, initial encounter for closed fracture: Secondary | ICD-10-CM | POA: Diagnosis not present

## 2019-12-17 DIAGNOSIS — R609 Edema, unspecified: Secondary | ICD-10-CM | POA: Diagnosis not present

## 2019-12-17 DIAGNOSIS — Y92002 Bathroom of unspecified non-institutional (private) residence single-family (private) house as the place of occurrence of the external cause: Secondary | ICD-10-CM | POA: Diagnosis not present

## 2019-12-17 DIAGNOSIS — W19XXXA Unspecified fall, initial encounter: Secondary | ICD-10-CM | POA: Diagnosis not present

## 2019-12-17 DIAGNOSIS — Z7901 Long term (current) use of anticoagulants: Secondary | ICD-10-CM | POA: Insufficient documentation

## 2019-12-17 DIAGNOSIS — S92344A Nondisplaced fracture of fourth metatarsal bone, right foot, initial encounter for closed fracture: Secondary | ICD-10-CM | POA: Diagnosis not present

## 2019-12-17 DIAGNOSIS — R109 Unspecified abdominal pain: Secondary | ICD-10-CM | POA: Diagnosis not present

## 2019-12-17 DIAGNOSIS — S92334A Nondisplaced fracture of third metatarsal bone, right foot, initial encounter for closed fracture: Secondary | ICD-10-CM | POA: Diagnosis not present

## 2019-12-17 DIAGNOSIS — S161XXA Strain of muscle, fascia and tendon at neck level, initial encounter: Secondary | ICD-10-CM | POA: Diagnosis not present

## 2019-12-17 DIAGNOSIS — S92301A Fracture of unspecified metatarsal bone(s), right foot, initial encounter for closed fracture: Secondary | ICD-10-CM | POA: Diagnosis not present

## 2019-12-17 DIAGNOSIS — Z8679 Personal history of other diseases of the circulatory system: Secondary | ICD-10-CM | POA: Diagnosis not present

## 2019-12-17 DIAGNOSIS — S92321A Displaced fracture of second metatarsal bone, right foot, initial encounter for closed fracture: Secondary | ICD-10-CM | POA: Diagnosis not present

## 2019-12-17 DIAGNOSIS — Y999 Unspecified external cause status: Secondary | ICD-10-CM | POA: Insufficient documentation

## 2019-12-17 DIAGNOSIS — S92331A Displaced fracture of third metatarsal bone, right foot, initial encounter for closed fracture: Secondary | ICD-10-CM | POA: Diagnosis not present

## 2019-12-17 DIAGNOSIS — W010XXA Fall on same level from slipping, tripping and stumbling without subsequent striking against object, initial encounter: Secondary | ICD-10-CM | POA: Diagnosis not present

## 2019-12-17 DIAGNOSIS — S92341A Displaced fracture of fourth metatarsal bone, right foot, initial encounter for closed fracture: Secondary | ICD-10-CM | POA: Diagnosis not present

## 2019-12-17 DIAGNOSIS — I4891 Unspecified atrial fibrillation: Secondary | ICD-10-CM | POA: Diagnosis not present

## 2019-12-17 DIAGNOSIS — S0093XA Contusion of unspecified part of head, initial encounter: Secondary | ICD-10-CM | POA: Diagnosis not present

## 2019-12-17 DIAGNOSIS — I6782 Cerebral ischemia: Secondary | ICD-10-CM | POA: Diagnosis not present

## 2019-12-17 DIAGNOSIS — G3189 Other specified degenerative diseases of nervous system: Secondary | ICD-10-CM | POA: Diagnosis not present

## 2019-12-17 DIAGNOSIS — I10 Essential (primary) hypertension: Secondary | ICD-10-CM | POA: Diagnosis not present

## 2019-12-17 DIAGNOSIS — S0990XA Unspecified injury of head, initial encounter: Secondary | ICD-10-CM | POA: Diagnosis present

## 2019-12-17 DIAGNOSIS — I6523 Occlusion and stenosis of bilateral carotid arteries: Secondary | ICD-10-CM | POA: Diagnosis not present

## 2019-12-17 DIAGNOSIS — M47812 Spondylosis without myelopathy or radiculopathy, cervical region: Secondary | ICD-10-CM | POA: Diagnosis not present

## 2019-12-17 DIAGNOSIS — S3991XA Unspecified injury of abdomen, initial encounter: Secondary | ICD-10-CM | POA: Diagnosis not present

## 2019-12-17 DIAGNOSIS — R52 Pain, unspecified: Secondary | ICD-10-CM | POA: Diagnosis not present

## 2019-12-17 LAB — CBG MONITORING, ED: Glucose-Capillary: 117 mg/dL — ABNORMAL HIGH (ref 70–99)

## 2019-12-17 NOTE — ED Triage Notes (Signed)
Pt to ED via [facility] after reported getting up to go to the bathroom at home and fell.hitting the back of her head.  Pt denies LOC.  Pt A & O x's 3   Only c/o soreness to back of head and pain to right foot.

## 2019-12-17 NOTE — ED Notes (Signed)
CBG Results of 117 reported to Maudie Mercury, Therapist, sports.

## 2019-12-17 NOTE — ED Notes (Signed)
Pts daughter [name] @ [phone] please update as soon as one is avail.

## 2019-12-17 NOTE — ED Provider Notes (Signed)
[facility] EMERGENCY DEPARTMENT Provider Note   CSN: [id number] Arrival date & time: 12/17/19  2252     History Chief Complaint  Patient presents with  . Fall    [name] is a 86 y.o. female.  Patient is a 86 year old female with past medical history of atrial fibrillation on Coumadin.  She presents today for evaluation of fall.  Patient was walking to the bathroom when she lost her balance and fell backward.  She hit the back of her head on the floor.  She does report headache, but denies any loss of consciousness.  She denies any weakness or numbness.  The only other injury she reports is pain in her right foot.  The history is provided by the patient.  Fall This is a new problem. The current episode started less than 1 hour ago. The problem occurs constantly. The problem has not changed since onset.Associated symptoms include headaches. Pertinent negatives include no chest pain. Nothing aggravates the symptoms. Nothing relieves the symptoms. She has tried nothing for the symptoms.       No past medical history on file.  There are no problems to display for this patient.      OB History   No obstetric history on file.     No family history on file.  Social History   Tobacco Use  . Smoking status: Not on file  Substance Use Topics  . Alcohol use: Not on file  . Drug use: Not on file    Home Medications Prior to Admission medications   Not on File    Allergies    Patient has no allergy information on record.  Review of Systems   Review of Systems  Cardiovascular: Negative for chest pain.  Neurological: Positive for headaches.  All other systems reviewed and are negative.   Physical Exam Updated Vital Signs BP (!) 164/83 (BP Location: Left Arm)   Pulse 96   Temp 97.7 F (36.5 C) (Oral)   Resp 17   Ht 5\' 5"  (1.651 m)   Wt 73 kg   SpO2 97%   BMI 26.79 kg/m   Physical Exam Vitals and nursing note reviewed.    Constitutional:      General: She is not in acute distress.    Appearance: She is well-developed. She is not diaphoretic.  HENT:     Head: Normocephalic and atraumatic.  Eyes:     Extraocular Movements: Extraocular movements intact.     Pupils: Pupils are equal, round, and reactive to light.  Cardiovascular:     Rate and Rhythm: Normal rate and regular rhythm.     Heart sounds: No murmur. No friction rub. No gallop.   Pulmonary:     Effort: Pulmonary effort is normal. No respiratory distress.     Breath sounds: Normal breath sounds. No wheezing.  Abdominal:     General: Bowel sounds are normal. There is no distension.     Palpations: Abdomen is soft.     Tenderness: There is no abdominal tenderness.  Musculoskeletal:        General: Normal range of motion.     Cervical back: Normal range of motion and neck supple.     Comments: There is tenderness to palpation over the lateral aspect of the dorsum of the right foot.  There is no obvious deformity, but there is mild swelling.  Skin:    General: Skin is warm and dry.  Neurological:     General: No  focal deficit present.     Mental Status: She is alert and oriented to person, place, and time.     Cranial Nerves: No cranial nerve deficit.     Motor: No weakness.     Gait: Gait normal.     ED Results / Procedures / Treatments   Labs (all labs ordered are listed, but only abnormal results are displayed) Labs Reviewed  CBG MONITORING, ED - Abnormal; Notable for the following components:      Result Value   Glucose-Capillary 117 (*)    All other components within normal limits  BASIC METABOLIC PANEL  CBC WITH DIFFERENTIAL/PLATELET  PROTIME-INR    EKG EKG Interpretation  Date/Time:  Monday December 17 2019 23:08:50 EST Ventricular Rate:  103 PR Interval:    QRS Duration: 102 QT Interval:  349 QTC Calculation: 457 R Axis:   3 Text Interpretation: Atrial fibrillation Abnormal R-wave progression, early transition LVH with  secondary repolarization abnormality Confirmed by Veryl Speak [phone]) on 12/18/2019 12:26:16 AM   Radiology No results found.  Procedures Procedures (including critical care time)  Medications Ordered in ED Medications - No data to display  ED Course  I have reviewed the triage vital signs and the nursing notes.  Pertinent labs & imaging results that were available during my care of the patient were reviewed by me and considered in my medical decision making (see chart for details).    MDM Rules/Calculators/A&P  Patient presenting here after a fall at home.  Patient is on Coumadin and apparently fell backward and struck the back of her head.  There is no obvious trauma on exam and no lacerations or bleeding.  She appears neurologically intact, but is complaining of headache.  CT scan reveals no acute abnormality of the head or cervical spine.  She did have x-rays of her right foot as she was complaining of pain.  This showed fractures of the second, third, and fourth metatarsals of the right foot.  When discussing the above test results with the patient's daughter, [name], she expressed the concern that she has been complaining of abdominal discomfort recently.  Patient has a known abdominal aortic aneurysm and the daughter is requesting this be evaluated as well.  Patient underwent a CT scan that shows the 3.2 cm aneurysm with no evidence for leakage or other complication.  At this point, patient appears appropriate for discharge.  She will be placed in a cam walker and is to follow-up with her orthopedist in the next week.  She is to return to the ER as needed if symptoms worsen or change.  Final Clinical Impression(s) / ED Diagnoses Final diagnoses:  None    Rx / DC Orders ED Discharge Orders    None       Veryl Speak, MD 12/18/19 [phone]

## 2019-12-18 ENCOUNTER — Encounter (HOSPITAL_COMMUNITY): Payer: Self-pay | Admitting: Radiology

## 2019-12-18 ENCOUNTER — Emergency Department (HOSPITAL_COMMUNITY): Payer: PPO

## 2019-12-18 ENCOUNTER — Encounter: Payer: Self-pay | Admitting: Family

## 2019-12-18 DIAGNOSIS — S92309A Fracture of unspecified metatarsal bone(s), unspecified foot, initial encounter for closed fracture: Secondary | ICD-10-CM | POA: Diagnosis not present

## 2019-12-18 DIAGNOSIS — S161XXA Strain of muscle, fascia and tendon at neck level, initial encounter: Secondary | ICD-10-CM | POA: Diagnosis not present

## 2019-12-18 DIAGNOSIS — I4891 Unspecified atrial fibrillation: Secondary | ICD-10-CM | POA: Diagnosis not present

## 2019-12-18 DIAGNOSIS — S3991XA Unspecified injury of abdomen, initial encounter: Secondary | ICD-10-CM | POA: Diagnosis not present

## 2019-12-18 DIAGNOSIS — S92321A Displaced fracture of second metatarsal bone, right foot, initial encounter for closed fracture: Secondary | ICD-10-CM | POA: Diagnosis not present

## 2019-12-18 DIAGNOSIS — S92324A Nondisplaced fracture of second metatarsal bone, right foot, initial encounter for closed fracture: Secondary | ICD-10-CM | POA: Diagnosis not present

## 2019-12-18 DIAGNOSIS — G3189 Other specified degenerative diseases of nervous system: Secondary | ICD-10-CM | POA: Diagnosis not present

## 2019-12-18 DIAGNOSIS — I6523 Occlusion and stenosis of bilateral carotid arteries: Secondary | ICD-10-CM | POA: Diagnosis not present

## 2019-12-18 DIAGNOSIS — S92331A Displaced fracture of third metatarsal bone, right foot, initial encounter for closed fracture: Secondary | ICD-10-CM | POA: Diagnosis not present

## 2019-12-18 DIAGNOSIS — M47812 Spondylosis without myelopathy or radiculopathy, cervical region: Secondary | ICD-10-CM | POA: Diagnosis not present

## 2019-12-18 DIAGNOSIS — S92344A Nondisplaced fracture of fourth metatarsal bone, right foot, initial encounter for closed fracture: Secondary | ICD-10-CM | POA: Diagnosis not present

## 2019-12-18 DIAGNOSIS — I6782 Cerebral ischemia: Secondary | ICD-10-CM | POA: Diagnosis not present

## 2019-12-18 DIAGNOSIS — S92341A Displaced fracture of fourth metatarsal bone, right foot, initial encounter for closed fracture: Secondary | ICD-10-CM | POA: Diagnosis not present

## 2019-12-18 DIAGNOSIS — S92334A Nondisplaced fracture of third metatarsal bone, right foot, initial encounter for closed fracture: Secondary | ICD-10-CM | POA: Diagnosis not present

## 2019-12-18 LAB — CBC WITH DIFFERENTIAL/PLATELET
Abs Immature Granulocytes: 0.03 10*3/uL (ref 0.00–0.07)
Basophils Absolute: 0 10*3/uL (ref 0.0–0.1)
Basophils Relative: 0 %
Eosinophils Absolute: 0.1 10*3/uL (ref 0.0–0.5)
Eosinophils Relative: 3 %
HCT: 45.7 % (ref 36.0–46.0)
Hemoglobin: 15.4 g/dL — ABNORMAL HIGH (ref 12.0–15.0)
Immature Granulocytes: 1 %
Lymphocytes Relative: 21 %
Lymphs Abs: 1 10*3/uL (ref 0.7–4.0)
MCH: 33.1 pg (ref 26.0–34.0)
MCHC: 33.7 g/dL (ref 30.0–36.0)
MCV: 98.3 fL (ref 80.0–100.0)
Monocytes Absolute: 0.5 10*3/uL (ref 0.1–1.0)
Monocytes Relative: 10 %
Neutro Abs: 3 10*3/uL (ref 1.7–7.7)
Neutrophils Relative %: 65 %
Platelets: 220 10*3/uL (ref 150–400)
RBC: 4.65 MIL/uL (ref 3.87–5.11)
RDW: 12.6 % (ref 11.5–15.5)
WBC: 4.6 10*3/uL (ref 4.0–10.5)
nRBC: 0 % (ref 0.0–0.2)

## 2019-12-18 LAB — URINALYSIS, ROUTINE W REFLEX MICROSCOPIC
Bacteria, UA: NONE SEEN
Bilirubin Urine: NEGATIVE
Glucose, UA: NEGATIVE mg/dL
Ketones, ur: NEGATIVE mg/dL
Leukocytes,Ua: NEGATIVE
Nitrite: NEGATIVE
Protein, ur: NEGATIVE mg/dL
Specific Gravity, Urine: 1.002 — ABNORMAL LOW (ref 1.005–1.030)
pH: 8 (ref 5.0–8.0)

## 2019-12-18 LAB — BASIC METABOLIC PANEL
Anion gap: 13 (ref 5–15)
BUN: 9 mg/dL (ref 8–23)
CO2: 26 mmol/L (ref 22–32)
Calcium: 9.6 mg/dL (ref 8.9–10.3)
Chloride: 98 mmol/L (ref 98–111)
Creatinine, Ser: 0.67 mg/dL (ref 0.44–1.00)
GFR calc Af Amer: >60 mL/min (ref >60)
GFR calc non Af Amer: >60 mL/min (ref >60)
Glucose, Bld: 130 mg/dL — ABNORMAL HIGH (ref 70–99)
Potassium: 3.4 mmol/L — ABNORMAL LOW (ref 3.5–5.1)
Sodium: 137 mmol/L (ref 135–145)

## 2019-12-18 LAB — PROTIME-INR
INR: 2.4 — ABNORMAL HIGH (ref 0.8–1.2)
Prothrombin Time: 26.1 seconds — ABNORMAL HIGH (ref 11.4–15.2)

## 2019-12-18 MED ORDER — IOHEXOL 300 MG/ML  SOLN
100.0000 mL | Freq: Once | INTRAMUSCULAR | Status: AC | PRN
  Administered 2019-12-18: 100 mL via INTRAVENOUS

## 2019-12-18 NOTE — Discharge Instructions (Addendum)
Wear cam walker until followed up by orthopedics.  Patient report every 2 hours for 20 minutes for the next 2 days.  Follow-up with Dr. Alvan Dame in the next week.  His contact information has been provided in this discharge summary for you to call and make these arrangements.

## 2019-12-18 NOTE — ED Notes (Signed)
Pt's daughter [name]) updated on pt's condition

## 2019-12-25 DIAGNOSIS — M79671 Pain in right foot: Secondary | ICD-10-CM | POA: Diagnosis not present

## 2019-12-26 DIAGNOSIS — M8580 Other specified disorders of bone density and structure, unspecified site: Secondary | ICD-10-CM | POA: Diagnosis not present

## 2019-12-26 DIAGNOSIS — S92341D Displaced fracture of fourth metatarsal bone, right foot, subsequent encounter for fracture with routine healing: Secondary | ICD-10-CM | POA: Diagnosis not present

## 2019-12-26 DIAGNOSIS — I4891 Unspecified atrial fibrillation: Secondary | ICD-10-CM | POA: Diagnosis not present

## 2019-12-26 DIAGNOSIS — Z7901 Long term (current) use of anticoagulants: Secondary | ICD-10-CM | POA: Diagnosis not present

## 2019-12-26 DIAGNOSIS — S92331D Displaced fracture of third metatarsal bone, right foot, subsequent encounter for fracture with routine healing: Secondary | ICD-10-CM | POA: Diagnosis not present

## 2019-12-26 DIAGNOSIS — W19XXXA Unspecified fall, initial encounter: Secondary | ICD-10-CM | POA: Diagnosis not present

## 2019-12-26 DIAGNOSIS — S92321D Displaced fracture of second metatarsal bone, right foot, subsequent encounter for fracture with routine healing: Secondary | ICD-10-CM | POA: Diagnosis not present

## 2020-01-15 ENCOUNTER — Ambulatory Visit (INDEPENDENT_AMBULATORY_CARE_PROVIDER_SITE_OTHER): Payer: PPO

## 2020-01-15 ENCOUNTER — Other Ambulatory Visit: Payer: Self-pay

## 2020-01-15 DIAGNOSIS — S92321D Displaced fracture of second metatarsal bone, right foot, subsequent encounter for fracture with routine healing: Secondary | ICD-10-CM | POA: Diagnosis not present

## 2020-01-15 DIAGNOSIS — I4891 Unspecified atrial fibrillation: Secondary | ICD-10-CM

## 2020-01-15 DIAGNOSIS — M8580 Other specified disorders of bone density and structure, unspecified site: Secondary | ICD-10-CM | POA: Diagnosis not present

## 2020-01-15 DIAGNOSIS — S92341D Displaced fracture of fourth metatarsal bone, right foot, subsequent encounter for fracture with routine healing: Secondary | ICD-10-CM

## 2020-01-15 DIAGNOSIS — S92331D Displaced fracture of third metatarsal bone, right foot, subsequent encounter for fracture with routine healing: Secondary | ICD-10-CM | POA: Diagnosis not present

## 2020-01-15 DIAGNOSIS — W19XXXA Unspecified fall, initial encounter: Secondary | ICD-10-CM | POA: Diagnosis not present

## 2020-01-15 DIAGNOSIS — Z7901 Long term (current) use of anticoagulants: Secondary | ICD-10-CM | POA: Diagnosis not present

## 2020-02-04 ENCOUNTER — Telehealth: Payer: Self-pay | Admitting: Family Medicine

## 2020-02-04 NOTE — Telephone Encounter (Signed)
Appointment scheduled tomorrow, 2/23/20212, at 4:10 pm with [facility].  Patient aware.

## 2020-02-05 ENCOUNTER — Encounter: Payer: Self-pay | Admitting: Family

## 2020-02-05 ENCOUNTER — Other Ambulatory Visit: Payer: Self-pay

## 2020-02-05 ENCOUNTER — Ambulatory Visit (INDEPENDENT_AMBULATORY_CARE_PROVIDER_SITE_OTHER): Payer: PPO | Admitting: Family

## 2020-02-05 VITALS — BP 129/64 | HR 79 | Temp 98.6°F | Ht 64.0 in | Wt 162.0 lb

## 2020-02-05 DIAGNOSIS — Z7901 Long term (current) use of anticoagulants: Secondary | ICD-10-CM | POA: Diagnosis not present

## 2020-02-05 DIAGNOSIS — I482 Chronic atrial fibrillation, unspecified: Secondary | ICD-10-CM | POA: Diagnosis not present

## 2020-02-05 DIAGNOSIS — Z9181 History of falling: Secondary | ICD-10-CM

## 2020-02-05 LAB — COAGUCHEK XS/INR WAIVED
INR: 2.1 — ABNORMAL HIGH (ref 0.9–1.1)
Prothrombin Time: 25.6 s

## 2020-02-05 NOTE — Patient Instructions (Signed)
Description   Continue taking 2 tablets (6mg ) on Monday, Wednesdays and Fridays and take 1 tablet (3mg ) all other days of the week.  INR today is 2.1(goal is 2-3)  Perfect reading today

## 2020-02-05 NOTE — Progress Notes (Signed)
   Subjective:    Patient ID: [name], female    DOB: 04-27-1921, 86 y.o.   MRN: [id number]  Chief Complaint  Patient presents with  . Coagulation Disorder    HPI PT presents to the office today for INR check. She currently takes warfarin for A Fib.  Denies bleeding, changes in medications, dose changes. She does report intermittent bruising. She reports she did fall about a month ago and had to go to ED and broke second, third, and fourth metatarsals.   She reports she  Has been sad recently as she has had to put her dog down.   Review of Systems  All other systems reviewed and are negative.      Objective:   Physical Exam Vitals reviewed.  Constitutional:      General: She is not in acute distress.    Appearance: She is well-developed.  HENT:     Head: Normocephalic and atraumatic.  Eyes:     Pupils: Pupils are equal, round, and reactive to light.  Neck:     Thyroid: No thyromegaly.  Cardiovascular:     Rate and Rhythm: Normal rate and regular rhythm.     Heart sounds: Normal heart sounds. No murmur.  Pulmonary:     Effort: Pulmonary effort is normal. No respiratory distress.     Breath sounds: Normal breath sounds. No wheezing.  Abdominal:     General: Bowel sounds are normal. There is no distension.     Palpations: Abdomen is soft.     Tenderness: There is no abdominal tenderness.  Musculoskeletal:        General: Tenderness present.     Cervical back: Normal range of motion and neck supple.     Comments: Using rolling walker, right boot  Skin:    General: Skin is warm and dry.  Neurological:     Mental Status: She is alert and oriented to person, place, and time.     Cranial Nerves: No cranial nerve deficit.     Deep Tendon Reflexes: Reflexes are normal and symmetric.  Psychiatric:        Behavior: Behavior normal.        Thought Content: Thought content normal.        Judgment: Judgment normal.     BP 129/64   Pulse 79   Temp 98.6 F (37  C) (Temporal)   Ht 5\' 4"  (1.626 m)   Wt 162 lb (73.5 kg)   SpO2 97%   BMI 27.81 kg/m        Assessment & Plan:  [name] comes in today with chief complaint of Coagulation Disorder   Diagnosis and orders addressed:  1. Chronic atrial fibrillation (HCC) Description   Continue taking 2 tablets (6mg ) on Monday, Wednesdays and Fridays and take 1 tablet (3mg ) all other days of the week.  INR today is 2.1(goal is 2-3)  Perfect reading today    - CoaguChek XS/INR Waived  2. Chronic anticoagulation   3. Risk for falls Fall preventions discussed     Evelina Dun, FNP

## 2020-02-08 DIAGNOSIS — S92341D Displaced fracture of fourth metatarsal bone, right foot, subsequent encounter for fracture with routine healing: Secondary | ICD-10-CM | POA: Diagnosis not present

## 2020-02-08 DIAGNOSIS — W19XXXA Unspecified fall, initial encounter: Secondary | ICD-10-CM | POA: Diagnosis not present

## 2020-02-08 DIAGNOSIS — I4891 Unspecified atrial fibrillation: Secondary | ICD-10-CM | POA: Diagnosis not present

## 2020-02-08 DIAGNOSIS — S92331D Displaced fracture of third metatarsal bone, right foot, subsequent encounter for fracture with routine healing: Secondary | ICD-10-CM | POA: Diagnosis not present

## 2020-02-08 DIAGNOSIS — S92321D Displaced fracture of second metatarsal bone, right foot, subsequent encounter for fracture with routine healing: Secondary | ICD-10-CM | POA: Diagnosis not present

## 2020-02-08 DIAGNOSIS — Z7901 Long term (current) use of anticoagulants: Secondary | ICD-10-CM | POA: Diagnosis not present

## 2020-02-08 DIAGNOSIS — M8580 Other specified disorders of bone density and structure, unspecified site: Secondary | ICD-10-CM | POA: Diagnosis not present

## 2020-02-26 ENCOUNTER — Telehealth: Payer: Self-pay | Admitting: Cardiology

## 2020-02-26 NOTE — Telephone Encounter (Signed)
We are recommending the COVID-19 vaccine to all of our patients. Cardiac medications (including blood thinners) should not deter anyone from being vaccinated and there is no need to hold any of those medications prior to vaccine administration.     Currently, there is a hotline to call (active 12/21/19) to schedule vaccination appointments as no walk-ins will be accepted.   Number: [phone].    If an appointment is not available please go to [email or web address] to sign up for notification when additional vaccine appointments are available.   If you have further questions or concerns about the vaccine process, please visit [email or web address] or contact your primary care physician.   

## 2020-03-03 ENCOUNTER — Telehealth: Payer: Self-pay | Admitting: Cardiology

## 2020-03-03 NOTE — Telephone Encounter (Signed)
Daughter is aware - vaccine is recommended by Dr Ellyn Hack.  verbalized understanding

## 2020-03-03 NOTE — Telephone Encounter (Signed)
Patients daughter calling in to get Dr. Ellyn Hack or his nurse to give her mother the okay to get the covid vaccine.   I read  "We are recommending the COVID-19 vaccine to all of our patients. Cardiac medications (including blood thinners) should not deter anyone from being vaccinated and there is no need to hold any of those medications prior to vaccine administration."  Patient's daughter states that she is a Research officer, trade union and would please like to speak with the doctor or nurse just with everything going on with her mother and her being on a blood thinner.

## 2020-03-03 NOTE — Telephone Encounter (Signed)
Returned call to patient's daughter advised ok for her mother to take covid vaccine.I will let Dr.Harding know.

## 2020-03-03 NOTE — Telephone Encounter (Signed)
I can see no reason for her not to have the vaccines.  She would be the very tender person that would benefit from having the vaccine.   Glenetta Hew, MD

## 2020-03-10 ENCOUNTER — Telehealth: Payer: Self-pay | Admitting: Family Medicine

## 2020-03-10 NOTE — Telephone Encounter (Signed)
Left message to call back  

## 2020-03-10 NOTE — Telephone Encounter (Signed)
Patient called to confirm appointment.

## 2020-03-12 ENCOUNTER — Ambulatory Visit (INDEPENDENT_AMBULATORY_CARE_PROVIDER_SITE_OTHER): Payer: PPO | Admitting: Family Medicine

## 2020-03-12 ENCOUNTER — Other Ambulatory Visit: Payer: Self-pay

## 2020-03-12 ENCOUNTER — Encounter: Payer: Self-pay | Admitting: Family Medicine

## 2020-03-12 VITALS — BP 136/75 | HR 94 | Temp 98.6°F | Ht 65.0 in | Wt 155.0 lb

## 2020-03-12 DIAGNOSIS — I482 Chronic atrial fibrillation, unspecified: Secondary | ICD-10-CM

## 2020-03-12 DIAGNOSIS — Z7901 Long term (current) use of anticoagulants: Secondary | ICD-10-CM

## 2020-03-12 DIAGNOSIS — E876 Hypokalemia: Secondary | ICD-10-CM

## 2020-03-12 LAB — POCT INR: INR: 1.9 — AB (ref 2–3)

## 2020-03-12 LAB — COAGUCHEK XS/INR WAIVED
INR: 1.9 — ABNORMAL HIGH (ref 0.9–1.1)
Prothrombin Time: 22.7 s

## 2020-03-12 NOTE — Progress Notes (Signed)
Subjective: CC: INR check PCP: Janora Norlander, DO [id number] [name] is a 86 y.o. female presenting to clinic today for:  1.  Chronic anticoagulation for atrial fibrillation and history of DVT Patient with known chronic atrial fibrillation with chronic anticoagulation on Coumadin.  Goal INR is 2-3.    Patient was seen in february for INR check and INR was therapeutic at 2.3. She was instructed to continue use of Coumadin 6 mg on Mondays, Wednesdays and Fridays with 3 mg all other days.  She ate cabbage recently which she thinks is what caused her low INR.  She is not missed any doses of the Coumadin.  Denies any melena, hematochezia, change in exercise tolerance.  Shortness of breath.  ROS: Per HPI  Allergies  Allergen Reactions  . Contrast Media [Iodinated Diagnostic Agents] Hives  . Amoxicillin Itching  . Ampicillin Itching  . Cortisol [Hydrocortisone] Itching  . Ivp Dye [Iodinated Diagnostic Agents] Hives  . Levaquin [Levofloxacin] Itching and Rash  . Penicillins Rash    "haven't had it in years"  . Vancomycin Itching and Rash   Past Medical History:  Diagnosis Date  . AAA (abdominal aortic aneurysm) ([place])   . Allergy   . Anxiety   . Bladder cancer ([place]) dx'd 2011   Chronic microscopic hematuria; transitional cell cancer  . Blood transfusion 1960   "related to hysterectomy"  . Chronic atrial fibrillation (HCC)    Anticoagulated with warfarin, rate control with diltiazem and Toprol  . Chronic back pain   . Chronic headache    "probably 2/wk" (08/28/2015)  . Concussion w/o coma 99991111   Complicated by subarachnoid hemorrhage.  "even now has times when she's not able to comprehend" (05/08/12)  . Coronary artery disease    Nonobstructive  . Diverticulitis   . Diverticulosis   . DVT (deep venous thrombosis), right 2005   "right calf after 8 foot fall"  . Edema of both legs    Chronic, thought to be secondary to DVTs  . External hemorrhoids   . Family  history of adverse reaction to anesthesia 2012   daughter "had OR for crushed hand; had problems w/anesthesia & I was in there all day long" (  . Fatty liver   . Frequent UTI   . GERD (gastroesophageal reflux disease)   . H/O hiatal hernia   . High cholesterol   . Migraine    "rare now" (08/28/2015)  . Pneumonia ~ 2010;  2013; 07/2015  . Rheumatoid arthritis(714.0)    "hands" (08/28/2015)  . Urinary frequency     Current Outpatient Medications:  .  atorvastatin (LIPITOR) 10 MG tablet, Take 10 mg by mouth daily., Disp: , Rfl:  .  calcium carbonate (OS-CAL - DOSED IN MG OF ELEMENTAL CALCIUM) 1250 MG tablet, Take 1 tablet by mouth every morning. , Disp: , Rfl:  .  cholecalciferol (VITAMIN D) 1000 UNITS tablet, Take 1,000 Units by mouth daily., Disp: , Rfl:  .  diltiazem (CARDIZEM CD) 240 MG 24 hr capsule, Take 1 capsule (240 mg total) by mouth daily., Disp: 30 capsule, Rfl: 0 .  diltiazem (DILT-XR) 240 MG 24 hr capsule, DILT-XR 240 mg capsule, extended release  TAKE 1 CAPSULE BY MOUTH EVERY MORNING ON AN EMPTY STOMACH, Disp: , Rfl:  .  furosemide (LASIX) 20 MG tablet, Take 1 tablet (20 mg total) by mouth daily. (Patient taking differently: Take 20-40 mg by mouth daily. Take one tablet (20MG ) by mouth daily except on Mon, Wed,  Friday take 2 tablets (40 MG)), Disp: 30 tablet, Rfl: 0 .  loratadine (CLARITIN) 10 MG tablet, TAKE 1 TABLET BY MOUTH ONCE DAILY FOR ALLERGY, Disp: , Rfl:  .  Multiple Vitamins-Minerals (MULTIVITAMINS THER. W/MINERALS) TABS, Take 1 tablet by mouth daily. , Disp: , Rfl:  .  Multiple Vitamins-Minerals (PRESERVISION AREDS 2+MULTI VIT PO), Take by mouth., Disp: , Rfl:  .  Omega-3 Fatty Acids (FISH OIL) 1000 MG CAPS, Take 1,000 mg by mouth daily., Disp: , Rfl:  .  omeprazole (PRILOSEC) 20 MG capsule, TAKE 1 CAPSULE BY MOUTH TWICE A DAY, Disp: 180 capsule, Rfl: 2 .  PROAIR HFA 108 (90 Base) MCG/ACT inhaler, TAKE 2 PUFFS BY MOUTH EVERY 6 HOURS AS NEEDED FOR WHEEZE OR SHORTNESS OF  BREATH, Disp: 8.5 Inhaler, Rfl: 4 .  warfarin (COUMADIN) 3 MG tablet, Take 2 tablets (6mg ) on Mondays, Wednesdays, Fridays, and Saturdays and 1 tablet (3mg ) on Tuesdays, Thursdays, and Sundays, Disp: 60 tablet, Rfl: 3 Social History   Socioeconomic History  . Marital status: Widowed    Spouse name: Not on file  . Number of children: 3  . Years of education: Not on file  . Highest education level: 8th grade  Occupational History  . Occupation: Retired  Tobacco Use  . Smoking status: Former Smoker    Packs/day: 1.00    Years: 40.00    Pack years: 40.00    Types: Cigarettes    Quit date: 11/07/1982    Years since quitting: 37.3  . Smokeless tobacco: Former Systems developer    Types: Snuff  Substance and Sexual Activity  . Alcohol use: Yes    Alcohol/week: 7.0 standard drinks    Types: 7 Glasses of wine per week    Comment: 1 glass of wine prior to bed  . Drug use: No  . Sexual activity: Not Currently    Birth control/protection: Surgical  Other Topics Concern  . Not on file  Social History Narrative   ** Merged History Encounter **       She is a widowed mother of 33, grandmother of 36, great grandmother of 69. She does not   really get routine exercise. She quit smoking in 1983. She does not smoke and does not use illicit drugs and does not drink.   Social Determinants of Health   Financial Resource Strain:   . Difficulty of Paying Living Expenses:   Food Insecurity:   . Worried About Charity fundraiser in the Last Year:   . Arboriculturist in the Last Year:   Transportation Needs:   . Film/video editor (Medical):   Marland Kitchen Lack of Transportation (Non-Medical):   Physical Activity: Inactive  . Days of Exercise per Week: 0 days  . Minutes of Exercise per Session: 0 min  Stress:   . Feeling of Stress :   Social Connections:   . Frequency of Communication with Friends and Family:   . Frequency of Social Gatherings with Friends and Family:   . Attends Religious Services:   .  Active Member of Clubs or Organizations:   . Attends Archivist Meetings:   Marland Kitchen Marital Status:   Intimate Partner Violence:   . Fear of Current or Ex-Partner:   . Emotionally Abused:   Marland Kitchen Physically Abused:   . Sexually Abused:    Family History  Problem Relation Age of Onset  . Anesthesia problems Daughter   . Cancer Mother        uterine  .  Cancer Sister        leukemia  . Cancer Brother   . Prostate cancer Brother   . Cancer Brother   . Breast cancer Other        neice  . Heart disease Brother   . Cancer Brother   . Heart attack Sister   . Colon cancer Neg Hx     Objective: Office vital signs reviewed. There were no vitals taken for this visit.  Physical Examination:  General: Awake, alert, well nourished, well appearing. No acute distress HEENT: Sclera white.  MMM Cardio: Irregularly irregular.  Rate controlled.  S1S2 heard, no murmurs appreciated Pulm: Clear to auscultation bilaterally.  Normal work of breathing on room air.  No wheezes, rhonchi or rales. MSK: Ambulating with rolling walker.  She has swelling of the right knee.  Assessment/ Plan: 86 y.o. female   1. Chronic atrial fibrillation (HCC) INR subtherapeutic.  It was 1.9 today.  I am going to have her increase to 6 mg tomorrow and continue her regular dosing thereafter.  We will see her back next Friday.  Appointment has been scheduled. - CoaguChek XS/INR Waived - POCT INR  2. Chronic anticoagulation - CoaguChek XS/INR Waived - POCT INR  3. Hypokalemia - Basic Metabolic Panel - Magnesium - POCT INR   No orders of the defined types were placed in this encounter.  No orders of the defined types were placed in this encounter.    Janora Norlander, DO [place] [phone]

## 2020-03-12 NOTE — Patient Instructions (Signed)
Go up to 2 tablets tomorrow.  Then go back to normal dosing schedule.  See me in 1 week for recheck.

## 2020-03-13 LAB — SPECIMEN STATUS

## 2020-03-13 LAB — BASIC METABOLIC PANEL
BUN/Creatinine Ratio: 17 (ref 12–28)
BUN: 13 mg/dL (ref 10–36)
CO2: 21 mmol/L (ref 20–29)
Calcium: 8.9 mg/dL (ref 8.7–10.3)
Chloride: 99 mmol/L (ref 96–106)
Creatinine, Ser: 0.77 mg/dL (ref 0.57–1.00)
GFR calc Af Amer: 75 mL/min/{1.73_m2} (ref >59)
GFR calc non Af Amer: 65 mL/min/{1.73_m2} (ref >59)
Glucose: 116 mg/dL — ABNORMAL HIGH (ref 65–99)
Potassium: 4 mmol/L (ref 3.5–5.2)
Sodium: 140 mmol/L (ref 134–144)

## 2020-03-13 LAB — MAGNESIUM: Magnesium: 1.9 mg/dL (ref 1.6–2.3)

## 2020-03-21 ENCOUNTER — Other Ambulatory Visit: Payer: Self-pay

## 2020-03-21 ENCOUNTER — Encounter: Payer: Self-pay | Admitting: Family Medicine

## 2020-03-21 ENCOUNTER — Ambulatory Visit (INDEPENDENT_AMBULATORY_CARE_PROVIDER_SITE_OTHER): Payer: PPO | Admitting: Family Medicine

## 2020-03-21 VITALS — BP 130/69 | HR 75 | Temp 98.9°F | Ht 65.0 in | Wt 159.2 lb

## 2020-03-21 DIAGNOSIS — I482 Chronic atrial fibrillation, unspecified: Secondary | ICD-10-CM | POA: Diagnosis not present

## 2020-03-21 LAB — COAGUCHEK XS/INR WAIVED
INR: 2.9 — ABNORMAL HIGH (ref 0.9–1.1)
Prothrombin Time: 34.2 s

## 2020-03-21 LAB — POCT INR: INR: 2.9 (ref 2–3)

## 2020-03-21 NOTE — Patient Instructions (Addendum)
Ok to continue normal dose of Coumadin 6mg  M, W, F and 3mg  daily all other days.  See me back in 5-6 weeks

## 2020-03-21 NOTE — Progress Notes (Signed)
Subjective: CC: INR check PCP: Janora Norlander, DO [id number] E Szczesniak is a 86 y.o. female presenting to clinic today for:  1.  Chronic anticoagulation for atrial fibrillation and history of DVT Patient with known chronic atrial fibrillation with chronic anticoagulation on Coumadin.  Goal INR is 2-3.    Patient was subtherapeutic last visit.  With INR of 1.9.  This was thought to be related to consumption of cabbage.  She was increased to 6 mg 4 times a week with 3 mg daily all other days and she was to resume her normal dosing after last Thursday.  Denies any melena, hematochezia, change in exercise tolerance.  Shortness of breath.  ROS: Per HPI  Allergies  Allergen Reactions  . Contrast Media [Iodinated Diagnostic Agents] Hives  . Amoxicillin Itching  . Ampicillin Itching  . Cortisol [Hydrocortisone] Itching  . Ivp Dye [Iodinated Diagnostic Agents] Hives  . Levaquin [Levofloxacin] Itching and Rash  . Penicillins Rash    "haven't had it in years"  . Vancomycin Itching and Rash   Past Medical History:  Diagnosis Date  . AAA (abdominal aortic aneurysm) ([place])   . Allergy   . Anxiety   . Bladder cancer ([place]) dx'd 2011   Chronic microscopic hematuria; transitional cell cancer  . Blood transfusion 1960   "related to hysterectomy"  . Chronic atrial fibrillation (HCC)    Anticoagulated with warfarin, rate control with diltiazem and Toprol  . Chronic back pain   . Chronic headache    "probably 2/wk" (08/28/2015)  . Concussion w/o coma 99991111   Complicated by subarachnoid hemorrhage.  "even now has times when she's not able to comprehend" (05/08/12)  . Coronary artery disease    Nonobstructive  . Diverticulitis   . Diverticulosis   . DVT (deep venous thrombosis), right 2005   "right calf after 8 foot fall"  . Edema of both legs    Chronic, thought to be secondary to DVTs  . External hemorrhoids   . Family history of adverse reaction to anesthesia 2012   daughter  "had OR for crushed hand; had problems w/anesthesia & I was in there all day long" (  . Fatty liver   . Frequent UTI   . GERD (gastroesophageal reflux disease)   . H/O hiatal hernia   . High cholesterol   . Migraine    "rare now" (08/28/2015)  . Pneumonia ~ 2010;  2013; 07/2015  . Rheumatoid arthritis(714.0)    "hands" (08/28/2015)  . Urinary frequency     Current Outpatient Medications:  .  atorvastatin (LIPITOR) 10 MG tablet, Take 10 mg by mouth daily., Disp: , Rfl:  .  calcium carbonate (OS-CAL - DOSED IN MG OF ELEMENTAL CALCIUM) 1250 MG tablet, Take 1 tablet by mouth every morning. , Disp: , Rfl:  .  cholecalciferol (VITAMIN D) 1000 UNITS tablet, Take 1,000 Units by mouth daily., Disp: , Rfl:  .  diltiazem (CARDIZEM CD) 240 MG 24 hr capsule, Take 1 capsule (240 mg total) by mouth daily., Disp: 30 capsule, Rfl: 0 .  diltiazem (DILT-XR) 240 MG 24 hr capsule, DILT-XR 240 mg capsule, extended release  TAKE 1 CAPSULE BY MOUTH EVERY MORNING ON AN EMPTY STOMACH, Disp: , Rfl:  .  furosemide (LASIX) 20 MG tablet, Take 1 tablet (20 mg total) by mouth daily. (Patient taking differently: Take 20-40 mg by mouth daily. Take one tablet (20MG ) by mouth daily except on Mon, Wed, Friday take 2 tablets (40 MG)), Disp: 30 tablet,  Rfl: 0 .  loratadine (CLARITIN) 10 MG tablet, TAKE 1 TABLET BY MOUTH ONCE DAILY FOR ALLERGY, Disp: , Rfl:  .  Multiple Vitamins-Minerals (MULTIVITAMINS THER. W/MINERALS) TABS, Take 1 tablet by mouth daily. , Disp: , Rfl:  .  Multiple Vitamins-Minerals (PRESERVISION AREDS 2+MULTI VIT PO), Take by mouth., Disp: , Rfl:  .  Omega-3 Fatty Acids (FISH OIL) 1000 MG CAPS, Take 1,000 mg by mouth daily., Disp: , Rfl:  .  omeprazole (PRILOSEC) 20 MG capsule, TAKE 1 CAPSULE BY MOUTH TWICE A DAY, Disp: 180 capsule, Rfl: 2 .  PROAIR HFA 108 (90 Base) MCG/ACT inhaler, TAKE 2 PUFFS BY MOUTH EVERY 6 HOURS AS NEEDED FOR WHEEZE OR SHORTNESS OF BREATH, Disp: 8.5 Inhaler, Rfl: 4 .  warfarin (COUMADIN) 3  MG tablet, Take 2 tablets (6mg ) on Mondays, Wednesdays, Fridays, and Saturdays and 1 tablet (3mg ) on Tuesdays, Thursdays, and Sundays, Disp: 60 tablet, Rfl: 3 Social History   Socioeconomic History  . Marital status: Widowed    Spouse name: Not on file  . Number of children: 3  . Years of education: Not on file  . Highest education level: 8th grade  Occupational History  . Occupation: Retired  Tobacco Use  . Smoking status: Former Smoker    Packs/day: 1.00    Years: 40.00    Pack years: 40.00    Types: Cigarettes    Quit date: 11/07/1982    Years since quitting: 37.3  . Smokeless tobacco: Former Systems developer    Types: Snuff  Substance and Sexual Activity  . Alcohol use: Yes    Alcohol/week: 7.0 standard drinks    Types: 7 Glasses of wine per week    Comment: 1 glass of wine prior to bed  . Drug use: No  . Sexual activity: Not Currently    Birth control/protection: Surgical  Other Topics Concern  . Not on file  Social History Narrative   ** Merged History Encounter **       She is a widowed mother of 29, grandmother of 63, great grandmother of 32. She does not   really get routine exercise. She quit smoking in 1983. She does not smoke and does not use illicit drugs and does not drink.   Social Determinants of Health   Financial Resource Strain:   . Difficulty of Paying Living Expenses:   Food Insecurity:   . Worried About Charity fundraiser in the Last Year:   . Arboriculturist in the Last Year:   Transportation Needs:   . Film/video editor (Medical):   Marland Kitchen Lack of Transportation (Non-Medical):   Physical Activity: Inactive  . Days of Exercise per Week: 0 days  . Minutes of Exercise per Session: 0 min  Stress:   . Feeling of Stress :   Social Connections:   . Frequency of Communication with Friends and Family:   . Frequency of Social Gatherings with Friends and Family:   . Attends Religious Services:   . Active Member of Clubs or Organizations:   . Attends Theatre manager Meetings:   Marland Kitchen Marital Status:   Intimate Partner Violence:   . Fear of Current or Ex-Partner:   . Emotionally Abused:   Marland Kitchen Physically Abused:   . Sexually Abused:    Family History  Problem Relation Age of Onset  . Anesthesia problems Daughter   . Cancer Mother        uterine  . Cancer Sister  leukemia  . Cancer Brother   . Prostate cancer Brother   . Cancer Brother   . Breast cancer Other        neice  . Heart disease Brother   . Cancer Brother   . Heart attack Sister   . Colon cancer Neg Hx     Objective: Office vital signs reviewed. BP 130/69   Pulse 75   Temp 98.9 F (37.2 C) (Temporal)   Ht 5\' 5"  (1.651 m)   Wt 159 lb 3.2 oz (72.2 kg)   BMI 26.49 kg/m   Physical Examination:  General: Awake, alert, well nourished, well appearing. No acute distress HEENT: Sclera white.  MMM Cardio: Irregularly irregular.  Rate controlled.  S1S2 heard, no murmurs appreciated Pulm: Clear to auscultation bilaterally.  No wheezes, rhonchi or rales.  Normal work of breathing on room air. MSK: Ambulating with rolling walker.  She has swelling of the right knee.  Assessment/ Plan: 86 y.o. female   1. Chronic atrial fibrillation (HCC) INR now therapeutic at 2.9.  Continue current regimen with 6 mg Monday Wednesday Friday and 3 mg daily all other days.  Okay to follow-up in 5 to 6 weeks for recheck. - CoaguChek XS/INR Waived - POCT INR    Orders Placed This Encounter  Procedures  . CoaguChek XS/INR Waived   No orders of the defined types were placed in this encounter.    Janora Norlander, DO [place] [phone]

## 2020-04-14 ENCOUNTER — Telehealth: Payer: Self-pay | Admitting: Family Medicine

## 2020-04-14 ENCOUNTER — Encounter: Payer: Self-pay | Admitting: Family Medicine

## 2020-04-14 ENCOUNTER — Other Ambulatory Visit: Payer: Self-pay

## 2020-04-14 ENCOUNTER — Ambulatory Visit (INDEPENDENT_AMBULATORY_CARE_PROVIDER_SITE_OTHER): Payer: PPO | Admitting: Family Medicine

## 2020-04-14 ENCOUNTER — Telehealth: Payer: Self-pay | Admitting: Cardiology

## 2020-04-14 ENCOUNTER — Ambulatory Visit (INDEPENDENT_AMBULATORY_CARE_PROVIDER_SITE_OTHER): Payer: PPO

## 2020-04-14 VITALS — BP 144/77 | HR 98 | Temp 97.9°F | Ht 65.0 in | Wt 154.0 lb

## 2020-04-14 DIAGNOSIS — M47816 Spondylosis without myelopathy or radiculopathy, lumbar region: Secondary | ICD-10-CM | POA: Diagnosis not present

## 2020-04-14 DIAGNOSIS — S39012A Strain of muscle, fascia and tendon of lower back, initial encounter: Secondary | ICD-10-CM

## 2020-04-14 DIAGNOSIS — R109 Unspecified abdominal pain: Secondary | ICD-10-CM | POA: Diagnosis not present

## 2020-04-14 DIAGNOSIS — M4186 Other forms of scoliosis, lumbar region: Secondary | ICD-10-CM | POA: Diagnosis not present

## 2020-04-14 LAB — URINALYSIS, COMPLETE
Bilirubin, UA: NEGATIVE
Glucose, UA: NEGATIVE
Ketones, UA: NEGATIVE
Leukocytes,UA: NEGATIVE
Nitrite, UA: NEGATIVE
Protein,UA: NEGATIVE
RBC, UA: NEGATIVE
Specific Gravity, UA: <1.005 — ABNORMAL LOW (ref 1.005–1.030)
Urobilinogen, Ur: 0.2 mg/dL (ref 0.2–1.0)
pH, UA: 5 (ref 5.0–7.5)

## 2020-04-14 LAB — MICROSCOPIC EXAMINATION
Bacteria, UA: NONE SEEN
Renal Epithel, UA: NONE SEEN /hpf

## 2020-04-14 MED ORDER — BACLOFEN 10 MG PO TABS: 10.0000 mg | ORAL_TABLET | Freq: Every evening | ORAL | 0 refills | Status: DC | PRN

## 2020-04-14 MED ORDER — LIDOCAINE 5 % EX OINT: 1.0000 "application " | TOPICAL_OINTMENT | CUTANEOUS | 3 refills | Status: DC | PRN

## 2020-04-14 NOTE — Telephone Encounter (Signed)
Patient is having back pain on her left side. The daughter is worried that the back pain could be caused by an aortic aneurism. The patient has an appt with her PCP today and they will do an x-ray. She is just worried and does not know what to do.

## 2020-04-14 NOTE — Progress Notes (Signed)
BP (!) 144/77   Pulse 98   Temp 97.9 F (36.6 C)   Ht 5\' 5"  (1.651 m)   Wt 69.9 kg   SpO2 96%   BMI 25.63 kg/m    Subjective:   Patient ID: [name], female    DOB: [birth year], 86 y.o.   MRN: [id number]  HPI: [name] is a 86 y.o. female presenting on 04/14/2020 for Back Pain (mid lower back. Started last week. Holds urine. Burms with urination at time.)  [name] states that her low back pain started about 1 week ago when she was bending over to pull on her hose. The pain is worse when she tries to stand to get up. Heat/ice have not helped the pain. She also has burning when she urinates because she tends to hold on to her urine.  It has not been getting better or worse but just not improving.   Relevant past medical, surgical, family and social history reviewed and updated as indicated. Interim medical history since our last visit reviewed. Allergies and medications reviewed and updated.  Review of Systems  Constitutional: Negative for chills and fever.  Cardiovascular: Positive for leg swelling (Right lower leg swelling after knee procedure.). Negative for chest pain.  Gastrointestinal: Positive for constipation. Negative for nausea and vomiting.  Genitourinary: Positive for dysuria (Burning with urination).  Musculoskeletal: Positive for back pain.    Per HPI unless specifically indicated above   Allergies as of 04/14/2020      Reactions   Contrast Media [iodinated Diagnostic Agents] Hives   Amoxicillin Itching   Ampicillin Itching   Cortisol [hydrocortisone] Itching   Ivp Dye [iodinated Diagnostic Agents] Hives   Levaquin [levofloxacin] Itching, Rash   Penicillins Rash   "haven't had it in years"   Vancomycin Itching, Rash      Medication List       Accurate as of Apr 14, 2020  2:00 PM. If you have any questions, ask your nurse or doctor.        atorvastatin 10 MG tablet Commonly known as: LIPITOR Take 10 mg by mouth daily.     calcium carbonate 1250 (500 Ca) MG tablet Commonly known as: OS-CAL - dosed in mg of elemental calcium Take 1 tablet by mouth every morning.   cholecalciferol 1000 units tablet Commonly known as: VITAMIN D Take 1,000 Units by mouth daily.   diclofenac Sodium 1 % Gel Commonly known as: VOLTAREN Apply topically as needed.   Dilt-XR 240 MG 24 hr capsule Generic drug: diltiazem DILT-XR 240 mg capsule, extended release  TAKE 1 CAPSULE BY MOUTH EVERY MORNING ON AN EMPTY STOMACH   diltiazem 240 MG 24 hr capsule Commonly known as: CARDIZEM CD Take 1 capsule (240 mg total) by mouth daily.   Fish Oil 1000 MG Caps Take 1,000 mg by mouth daily.   furosemide 20 MG tablet Commonly known as: LASIX Take 1 tablet (20 mg total) by mouth daily. What changed:   how much to take  additional instructions   lidocaine 5 % ointment Commonly known as: XYLOCAINE Apply 1 application topically as needed. Started by: Fransisca Kaufmann , MD   loratadine 10 MG tablet Commonly known as: CLARITIN TAKE 1 TABLET BY MOUTH ONCE DAILY FOR ALLERGY   PRESERVISION AREDS 2+MULTI VIT PO Take by mouth.   multivitamins ther. w/minerals Tabs tablet Take 1 tablet by mouth daily.   olopatadine 0.1 % ophthalmic solution Commonly known as: PATANOL 1 drop 2 (two) times  daily.   omeprazole 20 MG capsule Commonly known as: PRILOSEC TAKE 1 CAPSULE BY MOUTH TWICE A DAY   ProAir HFA 108 (90 Base) MCG/ACT inhaler Generic drug: albuterol TAKE 2 PUFFS BY MOUTH EVERY 6 HOURS AS NEEDED FOR WHEEZE OR SHORTNESS OF BREATH   warfarin 3 MG tablet Commonly known as: COUMADIN Take as directed by the anticoagulation clinic. If you are unsure how to take this medication, talk to your nurse or doctor. Original instructions: Take 2 tablets (6mg ) on Mondays, Wednesdays, Fridays, and Saturdays and 1 tablet (3mg ) on Tuesdays, Thursdays, and Sundays        Objective:   BP (!) 144/77   Pulse 98   Temp 97.9 F (36.6 C)    Ht 5\' 5"  (1.651 m)   Wt 69.9 kg   SpO2 96%   BMI 25.63 kg/m   Wt Readings from Last 3 Encounters:  04/14/20 69.9 kg  03/21/20 72.2 kg  03/12/20 70.3 kg    Physical Exam Constitutional:      General: She is not in acute distress.    Appearance: Normal appearance.  Cardiovascular:     Rate and Rhythm: Normal rate. Rhythm irregularly irregular.     Pulses:          Radial pulses are 2+ on the right side.     Comments: S1/S2 auscultated. Pulmonary:     Effort: Pulmonary effort is normal.     Breath sounds: Normal breath sounds. No wheezing, rhonchi or rales.  Abdominal:     Palpations: Abdomen is soft.     Tenderness: There is right CVA tenderness and left CVA tenderness.  Musculoskeletal:     Lumbar back: Tenderness present. No deformity.  Skin:    General: Skin is warm and dry.  Neurological:     Mental Status: She is alert and oriented to person, place, and time.     Coordination: Coordination normal.     Gait: Gait normal.  Psychiatric:        Mood and Affect: Mood normal.        Behavior: Behavior normal.        Thought Content: Thought content normal.        Judgment: Judgment normal.    Assessment & Plan:   Problem List Items Addressed This Visit    None    Visit Diagnoses    Strain of lumbar region, initial encounter    -  Primary   Relevant Orders   DG Lumbar Spine 2-3 Views   Left flank pain       Relevant Orders   Urine Culture   Urinalysis, Complete (Completed)      Urinalysis shows 1+ blood and 0-5 WBCs, otherwise normal.  Await culture  Lumbar x-ray: Await final read from radiology Follow up plan: Return if symptoms worsen or fail to improve.  Likely a lumbar muscle sprain due to [name]'s history, symptoms, and physical exam findings. [name] is advised to apply Voltaren gel and Lidocaine gel to the area of pain on her back to help with her pain. Will check Lumbar Spin X-ray to rule out fracture.  Due to the reported symptom of  burning with urination, will check a U/A today; will also send the urine sample for culture.  Orders Placed This Encounter  Procedures  . Urine Culture  . Microscopic Examination  . DG Lumbar Spine 2-3 Views  . Urinalysis, Complete    Gaynelle Arabian, PA-S2 [place] 04/14/2020, 2:00  PM  Patient seen and examined with Gaynelle Arabian, PA student, agree with assessment and plan above.  We will watch for x-ray results from radiology.  We will treat like a musculoskeletal strain initially with topical lidocaine and she already has topical Voltaren gel and if does not improve or worsens she will let us know. Caryl Pina, MD [facility] 04/14/2020, 3:49 PM

## 2020-04-14 NOTE — Telephone Encounter (Signed)
Spoke with daughter and advised to keep appointment with PCP to be assessed If PCP feels related can order appropriate testing or reach out to office for sooner appointment Daughter verbalized understanding.

## 2020-04-14 NOTE — Telephone Encounter (Signed)
Please let her know that I sent in the baclofen which is a muscle relaxer which we discussed in the visit, just make sure she only takes it at bedtime and be very cautious with it because it can be sedating.

## 2020-04-14 NOTE — Telephone Encounter (Signed)
Daughter aware and verbalizes understanding. 

## 2020-04-16 LAB — URINE CULTURE

## 2020-04-21 ENCOUNTER — Telehealth: Payer: Self-pay | Admitting: Family Medicine

## 2020-04-21 NOTE — Telephone Encounter (Signed)
Provider if off this week.  Covering PCP- please advise

## 2020-04-21 NOTE — Telephone Encounter (Signed)
This is Dr. Warrick Parisian covering and I do agree that light therapy would be good for her back pain.

## 2020-04-21 NOTE — Telephone Encounter (Signed)
Attempted to return call - NA 

## 2020-04-21 NOTE — Telephone Encounter (Signed)
Pts daughter called wanting to know if Dr Lajuana Ripple thinks it would be a good idea for pt to do light therapy for all of her back pain.

## 2020-04-22 ENCOUNTER — Telehealth: Payer: Self-pay | Admitting: Family Medicine

## 2020-04-22 DIAGNOSIS — M545 Low back pain, unspecified: Secondary | ICD-10-CM

## 2020-04-22 NOTE — Telephone Encounter (Signed)
Daughter aware.

## 2020-04-22 NOTE — Telephone Encounter (Signed)
°  REFERRAL REQUEST Telephone Note 04/22/2020  What type of referral do you need? Physical Therapy  Have you been seen at our office for this problem? yes (Advise that they may need an appointment with their PCP before a referral can be done)  Is there a particular doctor or location that you prefer? Speciality Office next door  Patient notified that referrals can take up to a week or longer to process. If they haven't heard anything within a week they should call back and speak with the referral department.   They are waiting on referral. She already an appt.  [facility] pt.

## 2020-04-24 ENCOUNTER — Ambulatory Visit: Payer: PPO | Attending: Family Medicine | Admitting: Physical Therapy

## 2020-04-24 ENCOUNTER — Other Ambulatory Visit: Payer: Self-pay

## 2020-04-24 DIAGNOSIS — M545 Low back pain, unspecified: Secondary | ICD-10-CM

## 2020-04-24 DIAGNOSIS — R293 Abnormal posture: Secondary | ICD-10-CM | POA: Diagnosis not present

## 2020-04-24 NOTE — Therapy (Signed)
[facility] [place], [place], [address] Phone: [phone]   Fax:  [phone]  Physical Therapy Evaluation  Patient Details  Name: [name] MRN: [id number] Date of Birth: [birth year] Referring Provider (PT): Caryl Pina MD   Encounter Date: 04/24/2020  PT End of Session - 04/24/20 1415    Visit Number  1    Number of Visits  12    Date for PT Re-Evaluation  07/23/20    PT Start Time  0125    PT Stop Time  0206    PT Time Calculation (min)  41 min    Activity Tolerance  Patient tolerated treatment well    Behavior During Therapy  [facility] for tasks assessed/performed       Past Medical History:  Diagnosis Date  . AAA (abdominal aortic aneurysm) ([place])   . Allergy   . Anxiety   . Bladder cancer ([place]) dx'd 2011   Chronic microscopic hematuria; transitional cell cancer  . Blood transfusion 1960   "related to hysterectomy"  . Chronic atrial fibrillation (HCC)    Anticoagulated with warfarin, rate control with diltiazem and Toprol  . Chronic back pain   . Chronic headache    "probably 2/wk" (08/28/2015)  . Concussion w/o coma 1/60/1093   Complicated by subarachnoid hemorrhage.  "even now has times when she's not able to comprehend" (05/08/12)  . Coronary artery disease    Nonobstructive  . Diverticulitis   . Diverticulosis   . DVT (deep venous thrombosis), right 2005   "right calf after 8 foot fall"  . Edema of both legs    Chronic, thought to be secondary to DVTs  . External hemorrhoids   . Family history of adverse reaction to anesthesia 2012   daughter "had OR for crushed hand; had problems w/anesthesia & I was in there all day long" (  . Fatty liver   . Frequent UTI   . GERD (gastroesophageal reflux disease)   . H/O hiatal hernia   . High cholesterol   . Migraine    "rare now" (08/28/2015)  . Pneumonia ~ 2010;  2013; 07/2015  . Rheumatoid arthritis(714.0)    "hands" (08/28/2015)  . Urinary  frequency     Past Surgical History:  Procedure Laterality Date  . BREAST CYST EXCISION Left 1960's?   2 cysts; benign  . CARDIAC CATHETERIZATION  1980's  . CATARACT EXTRACTION W/ INTRAOCULAR LENS  IMPLANT, BILATERAL Bilateral 1992  . CYSTOSCOPY  11/11/2011   Procedure: CYSTOSCOPY;  Surgeon: Malka So;  Location: [facility];  Service: Urology;  Laterality: N/A;  . CYSTOSTOMY W/ BLADDER BIOPSY  10/08/2013  . INCONTINENCE SURGERY  1980's  . JOINT REPLACEMENT    . KNEE ARTHROSCOPY Right 2005   S/P fall  . Lower Extremity Venous Doppler  11/10/2013   No DVT or superficial thrombus enlarged inguinal lymph node noted in the right. No Baker's cyst.  . TONSILLECTOMY  1952  . TOTAL KNEE ARTHROPLASTY Right 02/05/2013   Procedure: TOTAL KNEE ARTHROPLASTY;  Surgeon: Mauri Pole, MD;  Location: [facility];  Service: Orthopedics;  Laterality: Right;  . TRANSTHORACIC ECHOCARDIOGRAM  01/'15; 4/'19   a) EF 60-65%. NO RWMA. Ao Sclerosis. MAC - no MS with mild MR>  B Atriae mildly dilated;; b)  EF 55-60%. AoV Sclerosis (no stenosis).  Trivial AI & MR. Mild LAE & Severe RAE.. Mild RV dilation  . TRANSURETHRAL RESECTION OF BLADDER TUMOR  11/11/2011   Procedure: TRANSURETHRAL RESECTION OF  BLADDER TUMOR (TURBT);  Surgeon: Malka So;  Location: [facility];  Service: Urology;  Laterality: N/A;  Cysto, Bladder Biopsy, TURBT with Gyrus,   . TRANSURETHRAL RESECTION OF BLADDER TUMOR WITH GYRUS (TURBT-GYRUS)  2007; 2009; 2010   "for tumors on surface of bladder"  . TUMOR EXCISION  1976; 1980's   "fatty tumor cut off her upper back; left thumb"  . VAGINAL HYSTERECTOMY  1960   partial     There were no vitals filed for this visit.   Subjective Assessment - 04/24/20 1417    Subjective  COVID-19 screen performed prior to patient entering clinic.  The patient presents to the clinic today with c/o of low back pain, left > right over the last few weeks.  She has been using heat but unfortunately kept it on too long  which made her itch and she scrated her skin in two areas in left low back/flank region.  She states her pain is quite bad today and rises to a 10 especially with transitort movements such as going from sit to stand and getting in and out of bed.  In fact, she has been sleeping in her recliner due to so much difficulty transferring in/out of bed.  Not moving decreases her pain.    Pertinent History  AAA, Right TKA, CAD, DVT, LE edema.    How long can you walk comfortably?  Short community distnaces with a rolling walker.    Diagnostic tests  X-ray.    Patient Stated Goals  Get out of pain.    Currently in Pain?  Yes    Pain Score  7     Pain Location  Back    Pain Orientation  Left    Pain Descriptors / Indicators  Aching;Sore;Throbbing    Pain Type  Acute pain    Pain Onset  1 to 4 weeks ago    Pain Frequency  Constant    Aggravating Factors   See above.    Pain Relieving Factors  See above.         [facility] PT Assessment - 04/24/20 0001      Assessment   Medical Diagnosis  Low back pain.    Referring Provider (PT)  Vonna Kotyk Dettinger MD    Onset Date/Surgical Date  --   Few weeks.     Precautions   Precautions  Fall      Restrictions   Weight Bearing Restrictions  No      Balance Screen   Has the patient fallen in the past 6 months  Yes    How many times?  --   1.  Dog related.   Has the patient had a decrease in activity level because of a fear of falling?   No    Is the patient reluctant to leave their home because of a fear of falling?   No      Home Environment   Living Environment  Private residence      Prior Function   Level of Independence  Independent      Observation/Other Assessments-Edema    Edema  --   LE edema.     Posture/Postural Control   Posture/Postural Control  Postural limitations    Postural Limitations  Rounded Shoulders;Forward head;Decreased lumbar lordosis;Increased thoracic kyphosis;Flexed trunk    Posture Comments  Thoraco-lumbar scoliosis  with convexity on left.  Bilateral genu valgum left > right.      ROM / Strength   AROM / PROM /  Strength  AROM;Strength      AROM   Overall AROM Comments  Very little active lumbar range of motion with extension to -5 degrees and flexion to 30 degrees.      Strength   Overall Strength Comments  Right hip flexion and abduction= 3 to 3+/5 limited at least in part due to pain.  Left hip= 4/5, left knee 3+/5.  RT knee = 5/5.        Palpation   Palpation comment  Tender to palpation over left lumbar musculature including the QL.  C/o pain over left SIJ as well.      Special Tests   Other special tests  (=) leg lengths and (-) SLR.      Bed Mobility   Bed Mobility  Rolling Right;Rolling Left    Rolling Right  Minimal Assistance - Patient > 75%    Rolling Left  Minimal Assistance - Patient > 75%      Transfers   Transfers  Sit to Stand    Sit to Stand  5: Supervision   Painful.     Ambulation/Gait   Gait Comments  The patient is walking with a FWW in trunk flexion and right lean.                  Objective measurements completed on examination: See above findings.      [facility] PT Treatment/Exercise - 04/24/20 0001      Manual Therapy   Manual Therapy  Soft tissue mobilization    Soft tissue mobilization  Right sdly position with folded pillow between knees for comfort:  STW/M x 10 minutes to patient left low back musculature including QL release technqiue.                  PT Long Term Goals - 04/24/20 1643      PT LONG TERM GOAL #1   Title  Independent with a HEP.    Time  6    Period  Weeks    Status  New      PT LONG TERM GOAL #2   Title  Sit to stand with pain not > 3/10.    Time  6    Period  Weeks    Status  New      PT LONG TERM GOAL #3   Title  Supine to sit with pain not > 3/10.    Time  6    Period  Weeks    Status  New             Plan - 04/24/20 1635    Clinical Impression Statement  The patient presents to OPPT  with c/o lert sided low back pain that has been ongoing for a few weeks.  She is found to be tender in her left low back region.  Special testing was negative.  She has very limited active spinal range of motion due to pain.  She has multiple postural abnormalites including scoliosis.  Transitory movements are very painful for her.  Patient will benefit from skilled physical therapy intervention to address deficits and pain.    Personal Factors and Comorbidities  Comorbidity 1;Comorbidity 3+;Comorbidity 2    Comorbidities  AAA, Right TKA, CAD, DVT, LE edema.    Examination-Activity Limitations  Other;Bed Mobility;Locomotion Level    Examination-Participation Restrictions  Other    Stability/Clinical Decision Making  Evolving/Moderate complexity    Clinical Decision Making  Low    Rehab Potential  Good    PT Frequency  2x / week    PT Duration  6 weeks    PT Treatment/Interventions  ADLs/Self Care Home Management;Cryotherapy;Electrical Stimulation;Moist Heat;Ultrasound;Therapeutic exercise;Therapeutic activities;Functional mobility training;Patient/family education;Manual techniques;Dry needling;Passive range of motion    PT Next Visit Plan  FOTO NEXT VISIT.....................May need to hold off on moist heat until scratches heal, U/S, STW/M, E'stim.  Core exercise progression.    Consulted and Agree with Plan of Care  Patient       Patient will benefit from skilled therapeutic intervention in order to improve the following deficits and impairments:  Pain, Decreased activity tolerance, Decreased range of motion, Increased muscle spasms, Postural dysfunction  Visit Diagnosis: Acute left-sided low back pain without sciatica - Plan: PT plan of care cert/re-cert  Abnormal posture - Plan: PT plan of care cert/re-cert     Problem List Patient Active Problem List   Diagnosis Date Noted  . Precordial pain 08/30/2018  . Reactive airway disease with acute exacerbation 01/28/2018  . Left  ventricular diastolic dysfunction, NYHA class 2 12/21/2013  . Non-rheumatic aortic sclerosis 12/21/2013  . AAA (abdominal aortic aneurysm) ([place]) 12/21/2013  . Abdominal aortic aneurysm ([place]) 12/21/2013  . 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 Chronic atrial fibrillation ([place])   . Edema of both legs   . 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 PVD, moderate bilat carotid disease 05/10/2012  . Dyslipidemia 05/10/2012  . Essential hypertension   . GERD (gastroesophageal reflux disease)   . Bladder cancer ([place])   . Rheumatoid arthritis(714.0)   . Primary bladder malignant neoplasm ([place]) 11/10/2011  . COLONIC POLYPS, ADENOMATOUS 01/26/2008  . HEMORRHOIDS, EXTERNAL 01/26/2008  . GASTRITIS, CHRONIC 01/26/2008  . HIATAL HERNIA 01/26/2008  . DIVERTICULOSIS OF COLON 01/26/2008  . Atrophic gastritis 01/26/2008    , [place] MPT 04/24/2020, 4:47 PM  [facility] [address] [place], [place], [address] Phone: [phone]   Fax:  [phone]  Name: JOHNEISHA BROADEN MRN: [id number] Date of Birth: 02-26-1921

## 2020-04-25 ENCOUNTER — Telehealth: Payer: Self-pay | Admitting: Cardiology

## 2020-04-25 NOTE — Telephone Encounter (Signed)
I spoke with patient's daughter who is asking if patient could take Aleve for arthritis back pain.  I told daughter we recommend that patients not take medications like Aleve especially since she is on coumadin.  I asked her to reach out to PCP for other pain control options.

## 2020-04-25 NOTE — Telephone Encounter (Signed)
Daughter of the patient called. The pt has had serious back pain and would like to know if the patient can take something stronger than Tylenol for pain. The tylenol has not helped. Please let the daughter know what the office advises

## 2020-04-28 ENCOUNTER — Other Ambulatory Visit: Payer: Self-pay

## 2020-04-28 ENCOUNTER — Ambulatory Visit: Payer: PPO | Admitting: Physical Therapy

## 2020-04-28 ENCOUNTER — Encounter: Payer: Self-pay | Admitting: Physical Therapy

## 2020-04-28 DIAGNOSIS — R293 Abnormal posture: Secondary | ICD-10-CM

## 2020-04-28 DIAGNOSIS — M545 Low back pain, unspecified: Secondary | ICD-10-CM

## 2020-04-28 NOTE — Therapy (Signed)
[facility] [place], [place], [address] Phone: [phone]   Fax:  [phone]  Physical Therapy Treatment  Patient Details  Name: [name] MRN: [id number] Date of Birth: 1921/02/10 Referring Provider (PT): Caryl Pina MD   Encounter Date: 04/28/2020  PT End of Session - 04/28/20 1117    Visit Number  2    Number of Visits  12    Date for PT Re-Evaluation  07/23/20    PT Start Time  6948    PT Stop Time  1200    PT Time Calculation (min)  37 min    Activity Tolerance  Patient tolerated treatment well    Behavior During Therapy  [facility] for tasks assessed/performed       Past Medical History:  Diagnosis Date  . AAA (abdominal aortic aneurysm) ([place])   . Allergy   . Anxiety   . Bladder cancer ([place]) dx'd 2011   Chronic microscopic hematuria; transitional cell cancer  . Blood transfusion 1960   "related to hysterectomy"  . Chronic atrial fibrillation (HCC)    Anticoagulated with warfarin, rate control with diltiazem and Toprol  . Chronic back pain   . Chronic headache    "probably 2/wk" (08/28/2015)  . Concussion w/o coma 5/46/2703   Complicated by subarachnoid hemorrhage.  "even now has times when she's not able to comprehend" (05/08/12)  . Coronary artery disease    Nonobstructive  . Diverticulitis   . Diverticulosis   . DVT (deep venous thrombosis), right 2005   "right calf after 8 foot fall"  . Edema of both legs    Chronic, thought to be secondary to DVTs  . External hemorrhoids   . Family history of adverse reaction to anesthesia 2012   daughter "had OR for crushed hand; had problems w/anesthesia & I was in there all day long" (  . Fatty liver   . Frequent UTI   . GERD (gastroesophageal reflux disease)   . H/O hiatal hernia   . High cholesterol   . Migraine    "rare now" (08/28/2015)  . Pneumonia ~ 2010;  2013; 07/2015  . Rheumatoid arthritis(714.0)    "hands" (08/28/2015)  . Urinary  frequency     Past Surgical History:  Procedure Laterality Date  . BREAST CYST EXCISION Left 1960's?   2 cysts; benign  . CARDIAC CATHETERIZATION  1980's  . CATARACT EXTRACTION W/ INTRAOCULAR LENS  IMPLANT, BILATERAL Bilateral 1992  . CYSTOSCOPY  11/11/2011   Procedure: CYSTOSCOPY;  Surgeon: Malka So;  Location: [facility];  Service: Urology;  Laterality: N/A;  . CYSTOSTOMY W/ BLADDER BIOPSY  10/08/2013  . INCONTINENCE SURGERY  1980's  . JOINT REPLACEMENT    . KNEE ARTHROSCOPY Right 2005   S/P fall  . Lower Extremity Venous Doppler  11/10/2013   No DVT or superficial thrombus enlarged inguinal lymph node noted in the right. No Baker's cyst.  . TONSILLECTOMY  1952  . TOTAL KNEE ARTHROPLASTY Right 02/05/2013   Procedure: TOTAL KNEE ARTHROPLASTY;  Surgeon: Mauri Pole, MD;  Location: [facility];  Service: Orthopedics;  Laterality: Right;  . TRANSTHORACIC ECHOCARDIOGRAM  01/'15; 4/'19   a) EF 60-65%. NO RWMA. Ao Sclerosis. MAC - no MS with mild MR>  B Atriae mildly dilated;; b)  EF 55-60%. AoV Sclerosis (no stenosis).  Trivial AI & MR. Mild LAE & Severe RAE.. Mild RV dilation  . TRANSURETHRAL RESECTION OF BLADDER TUMOR  11/11/2011   Procedure: TRANSURETHRAL RESECTION OF  BLADDER TUMOR (TURBT);  Surgeon: Malka So;  Location: [facility];  Service: Urology;  Laterality: N/A;  Cysto, Bladder Biopsy, TURBT with Gyrus,   . TRANSURETHRAL RESECTION OF BLADDER TUMOR WITH GYRUS (TURBT-GYRUS)  2007; 2009; 2010   "for tumors on surface of bladder"  . TUMOR EXCISION  1976; 1980's   "fatty tumor cut off her upper back; left thumb"  . VAGINAL HYSTERECTOMY  1960   partial     There were no vitals filed for this visit.  Subjective Assessment - 04/28/20 1116    Subjective  COVID 19 screening performed on patient upon arrival. Reports some improvement but not a lot.    Pertinent History  AAA, Right TKA, CAD, DVT, LE edema.    How long can you walk comfortably?  Short community distnaces with a rolling  walker.    Diagnostic tests  X-ray.    Patient Stated Goals  Get out of pain.    Currently in Pain?  Yes    Pain Score  9     Pain Location  Back    Pain Orientation  Left    Pain Descriptors / Indicators  Discomfort    Pain Type  Acute pain    Pain Onset  1 to 4 weeks ago    Pain Frequency  Constant         [facility] PT Assessment - 04/28/20 0001      Assessment   Medical Diagnosis  Low back pain.    Referring Provider (PT)  Vonna Kotyk Dettinger MD      Precautions   Precautions  Fall      Restrictions   Weight Bearing Restrictions  No                    [facility] Adult PT Treatment/Exercise - 04/28/20 0001      Modalities   Modalities  Electrical Stimulation;Ultrasound      Electrical Stimulation   Electrical Stimulation Location  L low back    Electrical Stimulation Action  Pre-Mod    Electrical Stimulation Parameters  80-150 hz x10 min    Electrical Stimulation Goals  Pain      Ultrasound   Ultrasound Location  L low back    Ultrasound Parameters  1.5 w/cm2, 100%, 1 mhz x10 min    Ultrasound Goals  Pain      Manual Therapy   Manual Therapy  Soft tissue mobilization    Soft tissue mobilization  STW to L SI joint, QL, lumbar paraspinals in R SL to reduce pain                  PT Long Term Goals - 04/24/20 1643      PT LONG TERM GOAL #1   Title  Independent with a HEP.    Time  6    Period  Weeks    Status  New      PT LONG TERM GOAL #2   Title  Sit to stand with pain not > 3/10.    Time  6    Period  Weeks    Status  New      PT LONG TERM GOAL #3   Title  Supine to sit with pain not > 3/10.    Time  6    Period  Weeks    Status  New            Plan - 04/28/20 1157    Clinical Impression  Statement  Patient presented in clinic with 9/10 L LBP and difficulty with transition movements as well as bed mobility. Patient sleeping in recliner due to pain with bed mobility and dificulty with RLE. Patient requires mod assist from PTA to  achieve R SL positioning for treatment. Patient intermittantly tender to L SI joint and QL region. Extra caution required due to scabbing over abrasions from heating pad burns. Normal modalities response noted following removal of the modalities.    Personal Factors and Comorbidities  Comorbidity 1;Comorbidity 3+;Comorbidity 2    Comorbidities  AAA, Right TKA, CAD, DVT, LE edema.    Examination-Activity Limitations  Other;Bed Mobility;Locomotion Level    Examination-Participation Restrictions  Other    Stability/Clinical Decision Making  Evolving/Moderate complexity    Rehab Potential  Good    PT Frequency  2x / week    PT Duration  6 weeks    PT Treatment/Interventions  ADLs/Self Care Home Management;Cryotherapy;Electrical Stimulation;Moist Heat;Ultrasound;Therapeutic exercise;Therapeutic activities;Functional mobility training;Patient/family education;Manual techniques;Dry needling;Passive range of motion    PT Next Visit Plan  FOTO NEXT VISIT.....................May need to hold off on moist heat until scratches heal, U/S, STW/M, E'stim.  Core exercise progression.    Consulted and Agree with Plan of Care  Patient       Patient will benefit from skilled therapeutic intervention in order to improve the following deficits and impairments:  Pain, Decreased activity tolerance, Decreased range of motion, Increased muscle spasms, Postural dysfunction  Visit Diagnosis: Acute left-sided low back pain without sciatica  Abnormal posture     Problem List Patient Active Problem List   Diagnosis Date Noted  . Precordial pain 08/30/2018  . Reactive airway disease with acute exacerbation 01/28/2018  . Left ventricular diastolic dysfunction, NYHA class 2 12/21/2013  . Non-rheumatic aortic sclerosis 12/21/2013  . AAA (abdominal aortic aneurysm) ([place]) 12/21/2013  . Abdominal aortic aneurysm ([place]) 12/21/2013  . 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 Chronic atrial fibrillation ([place])   . Edema of both  legs   . 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 PVD, moderate bilat carotid disease 05/10/2012  . Dyslipidemia 05/10/2012  . Essential hypertension   . GERD (gastroesophageal reflux disease)   . Bladder cancer ([place])   . Rheumatoid arthritis(714.0)   . Primary bladder malignant neoplasm ([place]) 11/10/2011  . COLONIC POLYPS, ADENOMATOUS 01/26/2008  . HEMORRHOIDS, EXTERNAL 01/26/2008  . GASTRITIS, CHRONIC 01/26/2008  . HIATAL HERNIA 01/26/2008  . DIVERTICULOSIS OF COLON 01/26/2008  . Atrophic gastritis 01/26/2008    Standley Brooking, PTA 04/28/2020, 12:06 PM  [facility] [address] [place], [place], [address] Phone: [phone]   Fax:  [phone]  Name: CANDID BOVEY MRN: [id number] Date of Birth: January 26, 1921

## 2020-05-02 ENCOUNTER — Other Ambulatory Visit: Payer: Self-pay

## 2020-05-02 ENCOUNTER — Encounter: Payer: Self-pay | Admitting: Family Medicine

## 2020-05-02 ENCOUNTER — Ambulatory Visit (INDEPENDENT_AMBULATORY_CARE_PROVIDER_SITE_OTHER): Payer: PPO | Admitting: Family Medicine

## 2020-05-02 VITALS — BP 128/71 | HR 100 | Temp 97.7°F | Resp 20 | Ht 65.0 in | Wt 154.0 lb

## 2020-05-02 DIAGNOSIS — I482 Chronic atrial fibrillation, unspecified: Secondary | ICD-10-CM | POA: Diagnosis not present

## 2020-05-02 LAB — COAGUCHEK XS/INR WAIVED
INR: 2.7 — ABNORMAL HIGH (ref 0.9–1.1)
Prothrombin Time: 32.8 s

## 2020-05-02 NOTE — Progress Notes (Signed)
Subjective:  Patient ID: [name], female    DOB: 10-12-1921  Age: 86 y.o. MRN: [id number]  CC: Recheck protime   HPI DAZJAH KISSAM presents for Patient in for follow-up of atrial fibrillation. Patient denies any recent bouts of chest pain or palpitations. Additionally, patient is taking anticoagulants. Patient denies any recent excessive bleeding episodes including epistaxis, bleeding from the gums, genitalia, rectal bleeding or hematuria. Additionally there has been no excessive bruising.  Depression screen [facility] 2/9 05/02/2020 04/14/2020 03/21/2020  Decreased Interest 0 0 0  Down, Depressed, Hopeless 0 0 0  PHQ - 2 Score 0 0 0  Altered sleeping - - -  Tired, decreased energy - - -  Change in appetite - - -  Feeling bad or failure about yourself  - - -  Trouble concentrating - - -  Moving slowly or fidgety/restless - - -  Suicidal thoughts - - -  PHQ-9 Score - - -  Difficult doing work/chores - - -  Some recent data might be hidden    History [name] has a past medical history of AAA (abdominal aortic aneurysm) ([place]), Allergy, Anxiety, Bladder cancer ([place]) (dx'd 2011), Blood transfusion (1960), Chronic atrial fibrillation ([place]), Chronic back pain, Chronic headache, Concussion w/o coma (03/31/2004), Coronary artery disease, Diverticulitis, Diverticulosis, DVT (deep venous thrombosis), right (2005), Edema of both legs, External hemorrhoids, Family history of adverse reaction to anesthesia (2012), Fatty liver, Frequent UTI, GERD (gastroesophageal reflux disease), H/O hiatal hernia, High cholesterol, Migraine, Pneumonia (~ 2010;  2013; 07/2015), Rheumatoid arthritis(714.0), and Urinary frequency.   She has a past surgical history that includes Transurethral resection of bladder tumor (11/11/2011); Cystoscopy (11/11/2011); Incontinence surgery (1980's); Transurethral resection of bladder tumor with gyrus (turbt-gyrus) (2007; 2009; 2010); Knee arthroscopy (Right, 2005); Cataract  extraction w/ intraocular lens  implant, bilateral (Bilateral, 1992); Tonsillectomy (1952); Cardiac catheterization (1980's); Total knee arthroplasty (Right, 02/05/2013); Cystostomy w/ bladder biopsy (10/08/2013); Lower Extremity Venous Doppler (11/10/2013); Joint replacement; Tumor excision [id number]; 1980's); Vaginal hysterectomy (1960); transthoracic echocardiogram (01/'15; 4/'19); and Breast cyst excision (Left, 1960's?).   Her family history includes Anesthesia problems in her daughter; Breast cancer in an other family member; Cancer in her brother, brother, brother, mother, and sister; Heart attack in her sister; Heart disease in her brother; Prostate cancer in her brother.She reports that she quit smoking about 37 years ago. Her smoking use included cigarettes. She has a 40.00 pack-year smoking history. She has quit using smokeless tobacco.  Her smokeless tobacco use included snuff. She reports current alcohol use of about 7.0 standard drinks of alcohol per week. She reports that she does not use drugs.    ROS Review of Systems  Constitutional: Negative.   HENT: Negative.   Eyes: Negative for visual disturbance.  Respiratory: Negative for shortness of breath.   Cardiovascular: Negative for chest pain.  Gastrointestinal: Negative for abdominal pain.  Musculoskeletal: Negative for arthralgias.    Objective:  BP 128/71   Pulse 100   Temp 97.7 F (36.5 C) (Temporal)   Resp 20   Ht 5\' 5"  (1.651 m)   Wt 154 lb (69.9 kg)   SpO2 97%   BMI 25.63 kg/m   BP Readings from Last 3 Encounters:  05/02/20 128/71  04/14/20 (!) 144/77  03/21/20 130/69    Wt Readings from Last 3 Encounters:  05/02/20 154 lb (69.9 kg)  04/14/20 154 lb (69.9 kg)  03/21/20 159 lb 3.2 oz (72.2 kg)     Physical Exam Constitutional:  General: She is not in acute distress.    Appearance: She is well-developed.  Cardiovascular:     Rate and Rhythm: Normal rate and regular rhythm.  Pulmonary:     Breath  sounds: Normal breath sounds.  Skin:    General: Skin is warm and dry.  Neurological:     Mental Status: She is alert and oriented to person, place, and time.       Assessment & Plan:   [name] was seen today for recheck protime.  Diagnoses and all orders for this visit:  Chronic atrial fibrillation ([place]) -     CoaguChek XS/INR Waived       I am having [name] maintain her multivitamins ther. w/minerals, calcium carbonate, cholecalciferol, Fish Oil, diltiazem, furosemide, warfarin, ProAir HFA, atorvastatin, omeprazole, loratadine, Multiple Vitamins-Minerals (PRESERVISION AREDS 2+MULTI VIT PO), diltiazem, olopatadine, diclofenac Sodium, lidocaine, and baclofen.  Allergies as of 05/02/2020      Reactions   Contrast Media [iodinated Diagnostic Agents] Hives   Amoxicillin Itching   Ampicillin Itching   Cortisol [hydrocortisone] Itching   Ivp Dye [iodinated Diagnostic Agents] Hives   Levaquin [levofloxacin] Itching, Rash   Penicillins Rash   "haven't had it in years"   Vancomycin Itching, Rash      Medication List       Accurate as of May 02, 2020 11:59 PM. If you have any questions, ask your nurse or doctor.        atorvastatin 10 MG tablet Commonly known as: LIPITOR Take 10 mg by mouth daily.   baclofen 10 MG tablet Commonly known as: LIORESAL Take 1 tablet (10 mg total) by mouth at bedtime as needed for muscle spasms.   calcium carbonate 1250 (500 Ca) MG tablet Commonly known as: OS-CAL - dosed in mg of elemental calcium Take 1 tablet by mouth every morning.   cholecalciferol 1000 units tablet Commonly known as: VITAMIN D Take 1,000 Units by mouth daily.   diclofenac Sodium 1 % Gel Commonly known as: VOLTAREN Apply topically as needed.   Dilt-XR 240 MG 24 hr capsule Generic drug: diltiazem DILT-XR 240 mg capsule, extended release  TAKE 1 CAPSULE BY MOUTH EVERY MORNING ON AN EMPTY STOMACH   diltiazem 240 MG 24 hr capsule Commonly known  as: CARDIZEM CD Take 1 capsule (240 mg total) by mouth daily.   Fish Oil 1000 MG Caps Take 1,000 mg by mouth daily.   furosemide 20 MG tablet Commonly known as: LASIX Take 1 tablet (20 mg total) by mouth daily. What changed:   how much to take  additional instructions   lidocaine 5 % ointment Commonly known as: XYLOCAINE Apply 1 application topically as needed.   loratadine 10 MG tablet Commonly known as: CLARITIN TAKE 1 TABLET BY MOUTH ONCE DAILY FOR ALLERGY   PRESERVISION AREDS 2+MULTI VIT PO Take by mouth.   multivitamins ther. w/minerals Tabs tablet Take 1 tablet by mouth daily.   olopatadine 0.1 % ophthalmic solution Commonly known as: PATANOL 1 drop 2 (two) times daily.   omeprazole 20 MG capsule Commonly known as: PRILOSEC TAKE 1 CAPSULE BY MOUTH TWICE A DAY   ProAir HFA 108 (90 Base) MCG/ACT inhaler Generic drug: albuterol TAKE 2 PUFFS BY MOUTH EVERY 6 HOURS AS NEEDED FOR WHEEZE OR SHORTNESS OF BREATH   warfarin 3 MG tablet Commonly known as: COUMADIN Take as directed by the anticoagulation clinic. If you are unsure how to take this medication, talk to your nurse or doctor. Original instructions:  Take 2 tablets (6mg ) on Mondays, Wednesdays, Fridays, and Saturdays and 1 tablet (3mg ) on Tuesdays, Thursdays, and Sundays        Follow-up: Return in about 1 month (around 06/02/2020).  Claretta Fraise, M.D.

## 2020-05-04 ENCOUNTER — Encounter: Payer: Self-pay | Admitting: Family Medicine

## 2020-05-05 ENCOUNTER — Ambulatory Visit: Payer: PPO | Admitting: Cardiology

## 2020-05-05 ENCOUNTER — Other Ambulatory Visit: Payer: Self-pay

## 2020-05-05 ENCOUNTER — Encounter: Payer: Self-pay | Admitting: Cardiology

## 2020-05-05 VITALS — BP 138/62 | HR 86 | Ht 65.0 in | Wt 153.0 lb

## 2020-05-05 DIAGNOSIS — I5189 Other ill-defined heart diseases: Secondary | ICD-10-CM

## 2020-05-05 DIAGNOSIS — I10 Essential (primary) hypertension: Secondary | ICD-10-CM | POA: Diagnosis not present

## 2020-05-05 DIAGNOSIS — I714 Abdominal aortic aneurysm, without rupture, unspecified: Secondary | ICD-10-CM

## 2020-05-05 DIAGNOSIS — R600 Localized edema: Secondary | ICD-10-CM

## 2020-05-05 DIAGNOSIS — I358 Other nonrheumatic aortic valve disorders: Secondary | ICD-10-CM

## 2020-05-05 DIAGNOSIS — I519 Heart disease, unspecified: Secondary | ICD-10-CM | POA: Diagnosis not present

## 2020-05-05 DIAGNOSIS — I4821 Permanent atrial fibrillation: Secondary | ICD-10-CM

## 2020-05-05 DIAGNOSIS — E785 Hyperlipidemia, unspecified: Secondary | ICD-10-CM

## 2020-05-05 NOTE — Patient Instructions (Signed)
Medication Instructions:  No changes  *If you need a refill on your cardiac medications before your next appointment, please call your pharmacy*   Lab Work: Not needed   Testing/Procedures: Not needed   Follow-Up: At [facility], you and your health needs are our priority.  As part of our continuing mission to provide you with exceptional heart care, we have created designated Provider Care Teams.  These Care Teams include your primary Cardiologist (physician) and Advanced Practice Providers (APPs -  Physician Assistants and Nurse Practitioners) who all work together to provide you with the care you need, when you need it.  We recommend signing up for the patient portal called "MyChart".  Sign up information is provided on this After Visit Summary.  MyChart is used to connect with patients for Virtual Visits (Telemedicine).  Patients are able to view lab/test results, encounter notes, upcoming appointments, etc.  Non-urgent messages can be sent to your provider as well.   To learn more about what you can do with MyChart, go to [email or web address].    Your next appointment:   6 month(s)  The format for your next appointment:   Either In Person or Virtual  Provider:   Glenetta Hew, MD

## 2020-05-05 NOTE — Progress Notes (Signed)
1 is all just  Primary Care Provider: Janora Norlander, DO Cardiologist: Glenetta Hew, MD Electrophysiologist: None  Clinic Note: Chief Complaint  Patient presents with  . Follow-up    Doing well  . Atrial Fibrillation    No symptoms    HPI:    [name] is a 86 y.o. female with a PMH below who presents today for 90-month follow-up for chronic/persistent atrial fibrillation and HFpEF.  [name] was last seen on October 23, 2019 via telemedicine. -> Noted that the AAA duplex showed abdominal aorta dilation of 3.1 cm.  Right common iliac and left common iliac artery dilation noted. --> Noted small from flip-flopping in her chest but nothing new.  Exercise limited by her knee.  Also noted poor balance.  Few off-and-on spells of resting chest discomfort that are sharp pains.  Nothing significant.  Using cane.  Noted some mild baseline fatigue and exertional dyspnea from deconditioning.  No changes made.  Recent Hospitalizations:   ER visit December 18, 2019-mechanical fall.  Lost balance and fell backwards had mild head trauma but no loss of consciousness.  Pain in right foot-metatarsal fracture.   Reviewed  CV studies:    The following studies were reviewed today: (if available, images/films reviewed: From Epic Chart or Care Everywhere) . 12/18/2019 CT abdomen/pelvis: Note acute traumatic findings.  Mild cardiomegaly with coronary atherosclerosis and mitral calcification.  Small hiatal hernia.  Pancolonic diverticulosis without diverticulitis.  Intrarenal abdominal aortic dilation measures 3.2 cm   Interval History:   [name] returns here today for in person evaluation stating that she is doing fairly well.  Besides the fact that her foot hurts her from her fall where she truthfully step to get out of the way of her puppy and lost her balance.  She wanted to avoid stepping on the puppy, and found herself falling backwards.   No real dizziness or  wooziness.  No syncope or near syncope.  She still has a few twinging chest discomfort episodes off and on but nothing associated with any particular activity.  Otherwise really she is not noticing anything more than the standard occasional flip-flopping in her chest but no rapid irregular beat.  She seemed to think that her heart is doing relatively well.  Obviously she is still deconditioned to get little short of breath when she goes for work any walks.  She is now using a rolling walker as opposed to cane, but would like to try to go back to the cane.  We talked about my feeling is that her walker is probably more safe.  CV Review of Symptoms (Summary) Cardiovascular ROS: positive for - dyspnea on exertion, irregular heartbeat and No real change from baseline negative for - chest pain, edema, orthopnea, paroxysmal nocturnal dyspnea, rapid heart rate, shortness of breath or Syncope/near syncope or TIA/amaurosis fugax, claudication  The patient does not have symptoms concerning for COVID-19 infection (fever, chills, cough, or new shortness of breath).  The patient is practicing social distancing & Masking.   She has had both of her COVID-19 vaccine injections.  REVIEWED OF SYSTEMS   Review of Systems  Constitutional: Positive for malaise/fatigue (Energy levels just short of there but not up or down.). Negative for weight loss.  HENT: Negative for nosebleeds.   Respiratory: Negative for sputum production.   Cardiovascular: Negative for leg swelling.  Gastrointestinal: Negative for abdominal pain, blood in stool and melena.  Genitourinary: Negative for hematuria.  Musculoskeletal: Positive for  back pain, falls and joint pain.  Neurological: Positive for dizziness (No more balance issues.). Negative for speech change, focal weakness and loss of consciousness.  Psychiatric/Behavioral: Positive for memory loss. The patient is not nervous/anxious and does not have insomnia.      I have  reviewed and (if needed) personally updated the patient's problem list, medications, allergies, past medical and surgical history, social and family history.   PAST MEDICAL HISTORY   Past Medical History:  Diagnosis Date  . AAA (abdominal aortic aneurysm) ([place])   . Allergy   . Anxiety   . Bladder cancer ([place]) dx'd 2011   Chronic microscopic hematuria; transitional cell cancer  . Blood transfusion 1960   "related to hysterectomy"  . Chronic atrial fibrillation (H[facility])    Anticoagulated with warfarin, rate control with diltiazem and Toprol  . Chronic back pain   . Chronic headache    "probably 2/wk" (08/28/2015)  . Concussion w/o coma 99991111   Complicated by subarachnoid hemorrhage.  "even now has times when she's not able to comprehend" (05/08/12)  . Coronary artery disease    Nonobstructive  . Diverticulitis   . Diverticulosis   . DVT (deep venous thrombosis), right 2005   "right calf after 8 foot fall"  . Edema of both legs    Chronic, thought to be secondary to DVTs  . External hemorrhoids   . Family history of adverse reaction to anesthesia 2012   daughter "had OR for crushed hand; had problems w/anesthesia & I was in there all day long" (  . Fatty liver   . Frequent UTI   . GERD (gastroesophageal reflux disease)   . H/O hiatal hernia   . High cholesterol   . Migraine    "rare now" (08/28/2015)  . Pneumonia ~ 2010;  2013; 07/2015  . Rheumatoid arthritis(714.0)    "hands" (08/28/2015)  . Urinary frequency     PAST SURGICAL HISTORY   Reviewed in epic.  MEDICATIONS/ALLERGIES   Current Meds  Medication Sig  . atorvastatin (LIPITOR) 10 MG tablet Take 10 mg by mouth daily.  . calcium carbonate (OS-CAL - DOSED IN MG OF ELEMENTAL CALCIUM) 1250 MG tablet Take 1 tablet by mouth every morning.   . cholecalciferol (VITAMIN D) 1000 UNITS tablet Take 1,000 Units by mouth daily.  . diclofenac Sodium (VOLTAREN) 1 % GEL Apply topically as needed.  . diltiazem (DILT-XR) 240 MG 24  hr capsule DILT-XR 240 mg capsule, extended release  TAKE 1 CAPSULE BY MOUTH EVERY MORNING ON AN EMPTY STOMACH  . furosemide (LASIX) 20 MG tablet Take 1 tablet (20 mg total) by mouth daily. (Patient taking differently: Take 20-40 mg by mouth daily. Take one tablet (20MG ) by mouth daily except on Mon, Wed, Friday take 2 tablets (40 MG))  . lidocaine (XYLOCAINE) 5 % ointment Apply 1 application topically as needed.  . loratadine (CLARITIN) 10 MG tablet TAKE 1 TABLET BY MOUTH ONCE DAILY FOR ALLERGY  . Multiple Vitamins-Minerals (MULTIVITAMINS THER. W/MINERALS) TABS Take 1 tablet by mouth daily.   . Multiple Vitamins-Minerals (PRESERVISION AREDS 2+MULTI VIT PO) Take by mouth.  Marland Kitchen olopatadine (PATANOL) 0.1 % ophthalmic solution 1 drop 2 (two) times daily.  . Omega-3 Fatty Acids (FISH OIL) 1000 MG CAPS Take 1,000 mg by mouth daily.  Marland Kitchen omeprazole (PRILOSEC) 20 MG capsule TAKE 1 CAPSULE BY MOUTH TWICE A DAY  . PROAIR HFA 108 (90 Base) MCG/ACT inhaler TAKE 2 PUFFS BY MOUTH EVERY 6 HOURS AS NEEDED FOR WHEEZE OR  SHORTNESS OF BREATH  . warfarin (COUMADIN) 3 MG tablet Take 2 tablets (6mg ) on Mondays, Wednesdays, Fridays, and Saturdays and 1 tablet (3mg ) on Tuesdays, Thursdays, and Sundays  . [DISCONTINUED] baclofen (LIORESAL) 10 MG tablet Take 1 tablet (10 mg total) by mouth at bedtime as needed for muscle spasms.    Allergies  Allergen Reactions  . Contrast Media [Iodinated Diagnostic Agents] Hives  . Amoxicillin Itching  . Ampicillin Itching  . Cortisol [Hydrocortisone] Itching  . Ivp Dye [Iodinated Diagnostic Agents] Hives  . Levaquin [Levofloxacin] Itching and Rash  . Penicillins Rash    "haven't had it in years"  . Vancomycin Itching and Rash    SOCIAL HISTORY/FAMILY HISTORY   Reviewed in Epic:  Pertinent findings: No new changes  OBJCTIVE -PE, EKG, labs   Wt Readings from Last 3 Encounters:  05/05/20 153 lb (69.4 kg)  05/02/20 154 lb (69.9 kg)  04/14/20 154 lb (69.9 kg)    Physical  Exam: BP 138/62   Pulse 86   Ht 5\' 5"  (1.651 m)   Wt 153 lb (69.4 kg)   BMI 25.46 kg/m  Physical Exam  Constitutional: She is oriented to person, place, and time. She appears well-developed and well-nourished. No distress.  Relatively healthy-appearing elderly woman.  Likely younger than stated age.  HENT:  Head: Normocephalic and atraumatic.  Neck: No hepatojugular reflux and no JVD present. Carotid bruit is not present.  Cardiovascular: Normal rate, S1 normal and S2 normal. An irregularly irregular rhythm present. PMI is not displaced. Exam reveals distant heart sounds and decreased pulses (Mildly decreased pedal pulses). Exam reveals no gallop.  Murmur heard.  Medium-pitched harsh crescendo-decrescendo early systolic murmur is present with a grade of 1/6 at the upper right sternal border radiating to the neck. Pulmonary/Chest: Effort normal and breath sounds normal. No respiratory distress. She has no wheezes. She has no rales.  Musculoskeletal:        General: No edema (Trivial). Normal range of motion.     Cervical back: Normal range of motion and neck supple.     Comments: Mild to moderate kyphoscoliosis.  Neurological: She is alert and oriented to person, place, and time.  Psychiatric: She has a normal mood and affect. Her behavior is normal. Judgment and thought content normal.  Vitals reviewed.    Adult ECG Report  Rate: 86;  Rhythm: atrial fibrillation and LVH.  Cannot exclude septal MI, age undetermined.  Otherwise normal axis, intervals/durations.;   Narrative Interpretation: Stable EKG  Recent Labs:    11/19/2019-TC 129, TG 85, HDL 61.  Last LDL was from September 2019-71. No results found for: CHOL, HDL, LDLCALC, LDLDIRECT, TRIG, CHOLHDL Lab Results  Component Value Date   CREATININE 0.77 03/12/2020   BUN 13 03/12/2020   NA 140 03/12/2020   K 4.0 03/12/2020   CL 99 03/12/2020   CO2 21 03/12/2020   Lab Results  Component Value Date   TSH 0.822 01/29/2018     ASSESSMENT/PLAN    Problem List Items Addressed This Visit    Permanent atrial fibrillation (HCC) - Primary (Chronic)    Stable, rate controlled on diltiazem.  No signs or symptoms of rapid A. fib.  Seems to be well controlled.  No bleeding issues on warfarin.  This patients CHA2DS2-VASc Score and unadjusted Ischemic Stroke Rate (% per year) is equal to 7.2 % stroke rate/year from a score of 5  Above score calculated as 1 point each if present [CHF, HTN, DM, Vascular=MI/PAD/Aortic Plaque, Age  if 65-74, or Female] Above score calculated as 2 points each if present [Age > 75, or Stroke/TIA/TE]        Relevant Orders   EKG 12-Lead (Completed)   Essential hypertension (Chronic)   Dyslipidemia (Chronic)    On low-dose atorvastatin.  Labs seem to been pretty well controlled.  I do not unfortunately have most recent LDL levels.  Defer to PCP.      Left ventricular diastolic dysfunction, NYHA class 2 (Chronic)    She clearly has diastolic dysfunction and if she has rapid A. fib, may note some diastolic heart failure symptoms.  She does not really have any PND or orthopnea.  Relatively controlled edema with taking her furosemide Monday Wednesday Friday double dose.  She has not had to take any additional doses.  We are using diltiazem for rate control as opposed to beta-blocker because of issues with beta-blockers in the past.      Non-rheumatic aortic sclerosis (Chronic)    Mild, soft murmur on exam.  Unless it worsens, I would not further evaluate.      Relevant Orders   EKG 12-Lead (Completed)   Edema of both legs (Chronic)    I think her edema is probably more related to chronic venous stasis as opposed to any diastolic heart failure.  Well-controlled on current dose of Lasix.  She is doubling up 3 days a week.--Edema is not associated with PND or orthopnea.      AAA (abdominal aortic aneurysm) (HCC) (Chronic)    Stable on both CT and Doppler.  Recommendation will be to  3-year follow-up however given her advanced age, probably would not recheck.      Relevant Orders   EKG 12-Lead (Completed)       COVID-19 Education: The signs and symptoms of COVID-19 were discussed with the patient and how to seek care for testing (follow up with PCP or arrange E-visit).   The importance of social distancing and COVID-19 vaccination was discussed today.  I spent a total of 30minutes with the patient. >  50% of the time was spent in direct patient consultation.  Additional time spent with chart review  / charting (studies, outside notes, etc): 6 Total Time: 24 min   Current medicines are reviewed at length with the patient today.  (+/- concerns) n/a  Notice: This dictation was prepared with Dragon dictation along with smaller phrase technology. Any transcriptional errors that result from this process are unintentional and may not be corrected upon review.  Patient Instructions / Medication Changes & Studies & Tests Ordered   Patient Instructions  Medication Instructions:  No changes  *If you need a refill on your cardiac medications before your next appointment, please call your pharmacy*   Lab Work: Not needed   Testing/Procedures: Not needed   Follow-Up: At [facility], you and your health needs are our priority.  As part of our continuing mission to provide you with exceptional heart care, we have created designated Provider Care Teams.  These Care Teams include your primary Cardiologist (physician) and Advanced Practice Providers (APPs -  Physician Assistants and Nurse Practitioners) who all work together to provide you with the care you need, when you need it.  We recommend signing up for the patient portal called "MyChart".  Sign up information is provided on this After Visit Summary.  MyChart is used to connect with patients for Virtual Visits (Telemedicine).  Patients are able to view lab/test results, encounter notes, upcoming appointments, etc.  Non-urgent messages can be sent to your provider as well.   To learn more about what you can do with MyChart, go to [email or web address].    Your next appointment:   6 month(s)  The format for your next appointment:   Either In Person or Virtual  Provider:   Glenetta Hew, MD      Studies Ordered:   Orders Placed This Encounter  Procedures  . EKG 12-Lead     Glenetta Hew, M.D., M.S. Interventional Cardiologist   Pager # [phone] Phone # [phone] [address] [place], [place] [address]   Thank you for choosing Heartcare at [facility]!!

## 2020-05-06 ENCOUNTER — Other Ambulatory Visit: Payer: Self-pay

## 2020-05-06 ENCOUNTER — Encounter: Payer: Self-pay | Admitting: Physical Therapy

## 2020-05-06 ENCOUNTER — Ambulatory Visit: Payer: PPO | Admitting: Physical Therapy

## 2020-05-06 DIAGNOSIS — M545 Low back pain, unspecified: Secondary | ICD-10-CM

## 2020-05-06 DIAGNOSIS — R293 Abnormal posture: Secondary | ICD-10-CM

## 2020-05-06 NOTE — Therapy (Signed)
[facility] [place], [place], [address] Phone: [phone]   Fax:  [phone]  Physical Therapy Treatment  Patient Details  Name: [name] MRN: [id number] Date of Birth: [birth year] Referring Provider (PT): Caryl Pina MD   Encounter Date: 05/06/2020  PT End of Session - 05/06/20 1123    Visit Number  3    Number of Visits  12    Date for PT Re-Evaluation  07/23/20    PT Start Time  5053    PT Stop Time  1204    PT Time Calculation (min)  41 min    Equipment Utilized During Treatment  Other (comment)   FWW   Activity Tolerance  Patient tolerated treatment well    Behavior During Therapy  [facility] for tasks assessed/performed       Past Medical History:  Diagnosis Date  . AAA (abdominal aortic aneurysm) ([place])   . Allergy   . Anxiety   . Bladder cancer ([place]) dx'd 2011   Chronic microscopic hematuria; transitional cell cancer  . Blood transfusion 1960   "related to hysterectomy"  . Chronic atrial fibrillation (HCC)    Anticoagulated with warfarin, rate control with diltiazem and Toprol  . Chronic back pain   . Chronic headache    "probably 2/wk" (08/28/2015)  . Concussion w/o coma 9/76/7341   Complicated by subarachnoid hemorrhage.  "even now has times when she's not able to comprehend" (05/08/12)  . Coronary artery disease    Nonobstructive  . Diverticulitis   . Diverticulosis   . DVT (deep venous thrombosis), right 2005   "right calf after 8 foot fall"  . Edema of both legs    Chronic, thought to be secondary to DVTs  . External hemorrhoids   . Family history of adverse reaction to anesthesia 2012   daughter "had OR for crushed hand; had problems w/anesthesia & I was in there all day long" (  . Fatty liver   . Frequent UTI   . GERD (gastroesophageal reflux disease)   . H/O hiatal hernia   . High cholesterol   . Migraine    "rare now" (08/28/2015)  . Pneumonia ~ 2010;  2013; 07/2015  .  Rheumatoid arthritis(714.0)    "hands" (08/28/2015)  . Urinary frequency     Past Surgical History:  Procedure Laterality Date  . BREAST CYST EXCISION Left 1960's?   2 cysts; benign  . CARDIAC CATHETERIZATION  1980's  . CATARACT EXTRACTION W/ INTRAOCULAR LENS  IMPLANT, BILATERAL Bilateral 1992  . CYSTOSCOPY  11/11/2011   Procedure: CYSTOSCOPY;  Surgeon: Malka So;  Location: [facility];  Service: Urology;  Laterality: N/A;  . CYSTOSTOMY W/ BLADDER BIOPSY  10/08/2013  . INCONTINENCE SURGERY  1980's  . JOINT REPLACEMENT    . KNEE ARTHROSCOPY Right 2005   S/P fall  . Lower Extremity Venous Doppler  11/10/2013   No DVT or superficial thrombus enlarged inguinal lymph node noted in the right. No Baker's cyst.  . TONSILLECTOMY  1952  . TOTAL KNEE ARTHROPLASTY Right 02/05/2013   Procedure: TOTAL KNEE ARTHROPLASTY;  Surgeon: Mauri Pole, MD;  Location: [facility];  Service: Orthopedics;  Laterality: Right;  . TRANSTHORACIC ECHOCARDIOGRAM  01/'15; 4/'19   a) EF 60-65%. NO RWMA. Ao Sclerosis. MAC - no MS with mild MR>  B Atriae mildly dilated;; b)  EF 55-60%. AoV Sclerosis (no stenosis).  Trivial AI & MR. Mild LAE & Severe RAE.. Mild RV dilation  . TRANSURETHRAL  RESECTION OF BLADDER TUMOR  11/11/2011   Procedure: TRANSURETHRAL RESECTION OF BLADDER TUMOR (TURBT);  Surgeon: Malka So;  Location: [facility];  Service: Urology;  Laterality: N/A;  Cysto, Bladder Biopsy, TURBT with Gyrus,   . TRANSURETHRAL RESECTION OF BLADDER TUMOR WITH GYRUS (TURBT-GYRUS)  2007; 2009; 2010   "for tumors on surface of bladder"  . TUMOR EXCISION  1976; 1980's   "fatty tumor cut off her upper back; left thumb"  . VAGINAL HYSTERECTOMY  1960   partial     There were no vitals filed for this visit.  Subjective Assessment - 05/06/20 1122    Subjective  COVID 19 screening performed on patient upon arrival. Reports she has still had LBP and has not been able to get into bed still.    Pertinent History  AAA, Right TKA, CAD,  DVT, LE edema.    How long can you walk comfortably?  Short community distnaces with a rolling walker.    Diagnostic tests  X-ray.    Patient Stated Goals  Get out of pain.    Currently in Pain?  Yes    Pain Location  Back    Pain Orientation  Left;Lower    Pain Descriptors / Indicators  Discomfort;Sore    Pain Type  Acute pain    Pain Onset  1 to 4 weeks ago    Pain Frequency  Constant         [facility] PT Assessment - 05/06/20 0001      Assessment   Medical Diagnosis  Low back pain.    Referring Provider (PT)  Vonna Kotyk Dettinger MD      Precautions   Precautions  Fall      Restrictions   Weight Bearing Restrictions  No                    [facility] Adult PT Treatment/Exercise - 05/06/20 0001      Modalities   Modalities  Electrical Stimulation;Moist Heat;Ultrasound      Moist Heat Therapy   Number Minutes Moist Heat  10 Minutes    Moist Heat Location  Lumbar Spine      Electrical Stimulation   Electrical Stimulation Location  L low back    Electrical Stimulation Action  Pre-Mod    Electrical Stimulation Parameters  80-150 hz x10 min    Electrical Stimulation Goals  Pain      Ultrasound   Ultrasound Location  L low back    Ultrasound Parameters  1.5 w/cm2, 100%, 1 mhz x10 min    Ultrasound Goals  Pain      Manual Therapy   Manual Therapy  Soft tissue mobilization    Soft tissue mobilization  STW to L SI joint, QL, lumbar paraspinals in R SL to reduce pain                  PT Long Term Goals - 04/24/20 1643      PT LONG TERM GOAL #1   Title  Independent with a HEP.    Time  6    Period  Weeks    Status  New      PT LONG TERM GOAL #2   Title  Sit to stand with pain not > 3/10.    Time  6    Period  Weeks    Status  New      PT LONG TERM GOAL #3   Title  Supine to sit with pain not >  3/10.    Time  6    Period  Weeks    Status  New            Plan - 05/06/20 1155    Clinical Impression Statement  Patient presented in clinic  with reports of continued L LBP and soreness. Patient reports she is still not sleeping in her bed but would like to try tonight. Requested to attempt bed mobility independently which she was able to elevate BLE independently. Patient indicated more sorenss of L SI joint region during manual therapy and prior to start of treatment. Abrasions are fully healed with no scabbing present. Normal modalities response noted following remova of the modalities.    Personal Factors and Comorbidities  Comorbidity 1;Comorbidity 3+;Comorbidity 2    Comorbidities  AAA, Right TKA, CAD, DVT, LE edema.    Examination-Activity Limitations  Other;Bed Mobility;Locomotion Level    Examination-Participation Restrictions  Other    Stability/Clinical Decision Making  Evolving/Moderate complexity    Rehab Potential  Good    PT Frequency  2x / week    PT Duration  6 weeks    PT Treatment/Interventions  ADLs/Self Care Home Management;Cryotherapy;Electrical Stimulation;Moist Heat;Ultrasound;Therapeutic exercise;Therapeutic activities;Functional mobility training;Patient/family education;Manual techniques;Dry needling;Passive range of motion    PT Next Visit Plan  FOTO NEXT VISIT.....................May need to hold off on moist heat until scratches heal, U/S, STW/M, E'stim.  Core exercise progression.    Consulted and Agree with Plan of Care  Patient       Patient will benefit from skilled therapeutic intervention in order to improve the following deficits and impairments:  Pain, Decreased activity tolerance, Decreased range of motion, Increased muscle spasms, Postural dysfunction  Visit Diagnosis: Acute left-sided low back pain without sciatica  Abnormal posture     Problem List Patient Active Problem List   Diagnosis Date Noted  . Precordial pain 08/30/2018  . Reactive airway disease with acute exacerbation 01/28/2018  . Left ventricular diastolic dysfunction, NYHA class 2 12/21/2013  . Non-rheumatic aortic  sclerosis 12/21/2013  . AAA (abdominal aortic aneurysm) ([place]) 12/21/2013  . Abdominal aortic aneurysm ([place]) 12/21/2013  . 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 Chronic atrial fibrillation ([place])   . Edema of both legs   . 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 PVD, moderate bilat carotid disease 05/10/2012  . Dyslipidemia 05/10/2012  . Essential hypertension   . GERD (gastroesophageal reflux disease)   . Bladder cancer ([place])   . Rheumatoid arthritis(714.0)   . Primary bladder malignant neoplasm ([place]) 11/10/2011  . COLONIC POLYPS, ADENOMATOUS 01/26/2008  . HEMORRHOIDS, EXTERNAL 01/26/2008  . GASTRITIS, CHRONIC 01/26/2008  . HIATAL HERNIA 01/26/2008  . DIVERTICULOSIS OF COLON 01/26/2008  . Atrophic gastritis 01/26/2008    Standley Brooking, PTA 05/06/2020, 12:08 PM  [facility] [address] [place], [place], [address] Phone: [phone]   Fax:  [phone]  Name: [name] MRN: [id number] Date of Birth: [birth year]

## 2020-05-07 ENCOUNTER — Other Ambulatory Visit: Payer: Self-pay | Admitting: Family Medicine

## 2020-05-08 ENCOUNTER — Encounter: Payer: Self-pay | Admitting: Physical Therapy

## 2020-05-08 ENCOUNTER — Other Ambulatory Visit: Payer: Self-pay

## 2020-05-08 ENCOUNTER — Ambulatory Visit: Payer: PPO | Admitting: Physical Therapy

## 2020-05-08 DIAGNOSIS — M545 Low back pain, unspecified: Secondary | ICD-10-CM

## 2020-05-08 DIAGNOSIS — R293 Abnormal posture: Secondary | ICD-10-CM

## 2020-05-08 NOTE — Therapy (Signed)
[facility] [place], [place], [address] Phone: [phone]   Fax:  [phone]  Physical Therapy Treatment  Patient Details  Name: [name] MRN: [id number] Date of Birth: 04-Nov-1921 Referring Provider (PT): Caryl Pina MD   Encounter Date: 05/08/2020  PT End of Session - 05/08/20 1125    Visit Number  4    Number of Visits  12    Date for PT Re-Evaluation  07/23/20    PT Start Time  1030    PT Stop Time  1121    PT Time Calculation (min)  51 min    Activity Tolerance  Patient tolerated treatment well    Behavior During Therapy  [facility] for tasks assessed/performed       Past Medical History:  Diagnosis Date  . AAA (abdominal aortic aneurysm) ([place])   . Allergy   . Anxiety   . Bladder cancer ([place]) dx'd 2011   Chronic microscopic hematuria; transitional cell cancer  . Blood transfusion 1960   "related to hysterectomy"  . Chronic atrial fibrillation (HCC)    Anticoagulated with warfarin, rate control with diltiazem and Toprol  . Chronic back pain   . Chronic headache    "probably 2/wk" (08/28/2015)  . Concussion w/o coma 5/63/1497   Complicated by subarachnoid hemorrhage.  "even now has times when she's not able to comprehend" (05/08/12)  . Coronary artery disease    Nonobstructive  . Diverticulitis   . Diverticulosis   . DVT (deep venous thrombosis), right 2005   "right calf after 8 foot fall"  . Edema of both legs    Chronic, thought to be secondary to DVTs  . External hemorrhoids   . Family history of adverse reaction to anesthesia 2012   daughter "had OR for crushed hand; had problems w/anesthesia & I was in there all day long" (  . Fatty liver   . Frequent UTI   . GERD (gastroesophageal reflux disease)   . H/O hiatal hernia   . High cholesterol   . Migraine    "rare now" (08/28/2015)  . Pneumonia ~ 2010;  2013; 07/2015  . Rheumatoid arthritis(714.0)    "hands" (08/28/2015)  . Urinary  frequency     Past Surgical History:  Procedure Laterality Date  . BREAST CYST EXCISION Left 1960's?   2 cysts; benign  . CARDIAC CATHETERIZATION  1980's  . CATARACT EXTRACTION W/ INTRAOCULAR LENS  IMPLANT, BILATERAL Bilateral 1992  . CYSTOSCOPY  11/11/2011   Procedure: CYSTOSCOPY;  Surgeon: Malka So;  Location: [facility];  Service: Urology;  Laterality: N/A;  . CYSTOSTOMY W/ BLADDER BIOPSY  10/08/2013  . INCONTINENCE SURGERY  1980's  . JOINT REPLACEMENT    . KNEE ARTHROSCOPY Right 2005   S/P fall  . Lower Extremity Venous Doppler  11/10/2013   No DVT or superficial thrombus enlarged inguinal lymph node noted in the right. No Baker's cyst.  . TONSILLECTOMY  1952  . TOTAL KNEE ARTHROPLASTY Right 02/05/2013   Procedure: TOTAL KNEE ARTHROPLASTY;  Surgeon: Mauri Pole, MD;  Location: [facility];  Service: Orthopedics;  Laterality: Right;  . TRANSTHORACIC ECHOCARDIOGRAM  01/'15; 4/'19   a) EF 60-65%. NO RWMA. Ao Sclerosis. MAC - no MS with mild MR>  B Atriae mildly dilated;; b)  EF 55-60%. AoV Sclerosis (no stenosis).  Trivial AI & MR. Mild LAE & Severe RAE.. Mild RV dilation  . TRANSURETHRAL RESECTION OF BLADDER TUMOR  11/11/2011   Procedure: TRANSURETHRAL RESECTION OF  BLADDER TUMOR (TURBT);  Surgeon: Malka So;  Location: [facility];  Service: Urology;  Laterality: N/A;  Cysto, Bladder Biopsy, TURBT with Gyrus,   . TRANSURETHRAL RESECTION OF BLADDER TUMOR WITH GYRUS (TURBT-GYRUS)  2007; 2009; 2010   "for tumors on surface of bladder"  . TUMOR EXCISION  1976; 1980's   "fatty tumor cut off her upper back; left thumb"  . VAGINAL HYSTERECTOMY  1960   partial     There were no vitals filed for this visit.  Subjective Assessment - 05/08/20 1116    Subjective  COVID-19 screen performed prior to patient entering clinic.  Doing better.  Got leg in bed the other night.    Pertinent History  AAA, Right TKA, CAD, DVT, LE edema.    How long can you walk comfortably?  Short community distnaces  with a rolling walker.    Diagnostic tests  X-ray.    Patient Stated Goals  Get out of pain.    Currently in Pain?  Yes    Pain Score  7     Pain Location  Back    Pain Orientation  Left;Lower    Pain Descriptors / Indicators  Tightness    Pain Onset  More than a month ago                        [facility] Adult PT Treatment/Exercise - 05/08/20 0001      Exercises   Exercises  Knee/Hip      Knee/Hip Exercises: Aerobic   Nustep  Level 3 x 15 minutes.      Modalities   Modalities  Electrical Stimulation;Moist Heat      Moist Heat Therapy   Number Minutes Moist Heat  15 Minutes    Moist Heat Location  Lumbar Spine      Electrical Stimulation   Electrical Stimulation Location  Left low back.    Electrical Stimulation Action  Pre-mod.    Electrical Stimulation Parameters  80-150 Hz x 15 minutes.    Electrical Stimulation Goals  Pain      Manual Therapy   Manual Therapy  Soft tissue mobilization    Soft tissue mobilization  RT sdly position with pillow between knees for comfort:  STW/M x 8 minutes to decrease tone.  Included QL release technique.                  PT Long Term Goals - 04/24/20 1643      PT LONG TERM GOAL #1   Title  Independent with a HEP.    Time  6    Period  Weeks    Status  New      PT LONG TERM GOAL #2   Title  Sit to stand with pain not > 3/10.    Time  6    Period  Weeks    Status  New      PT LONG TERM GOAL #3   Title  Supine to sit with pain not > 3/10.    Time  6    Period  Weeks    Status  New            Plan - 05/08/20 1126    Clinical Impression Statement  Patient did very well today.  Reporting she was able to put her leg in bed last night which she has not been able to d.    Personal Factors and Comorbidities  Comorbidity 1;Comorbidity 3+;Comorbidity  2    Comorbidities  AAA, Right TKA, CAD, DVT, LE edema.    Examination-Activity Limitations  Other;Bed Mobility;Locomotion Level     Examination-Participation Restrictions  Other    Stability/Clinical Decision Making  Evolving/Moderate complexity    Rehab Potential  Good    PT Frequency  2x / week    PT Duration  6 weeks    PT Treatment/Interventions  ADLs/Self Care Home Management;Cryotherapy;Electrical Stimulation;Moist Heat;Ultrasound;Therapeutic exercise;Therapeutic activities;Functional mobility training;Patient/family education;Manual techniques;Dry needling;Passive range of motion    PT Next Visit Plan  FOTO NEXT VISIT.....................May need to hold off on moist heat until scratches heal, U/S, STW/M, E'stim.  Core exercise progression.    Consulted and Agree with Plan of Care  Patient       Patient will benefit from skilled therapeutic intervention in order to improve the following deficits and impairments:  Pain, Decreased activity tolerance, Decreased range of motion, Increased muscle spasms, Postural dysfunction  Visit Diagnosis: Acute left-sided low back pain without sciatica  Abnormal posture     Problem List Patient Active Problem List   Diagnosis Date Noted  . Precordial pain 08/30/2018  . Reactive airway disease with acute exacerbation 01/28/2018  . Left ventricular diastolic dysfunction, NYHA class 2 12/21/2013  . Non-rheumatic aortic sclerosis 12/21/2013  . AAA (abdominal aortic aneurysm) ([place]) 12/21/2013  . Abdominal aortic aneurysm ([place]) 12/21/2013  . 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 Chronic atrial fibrillation ([place])   . Edema of both legs   . 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 PVD, moderate bilat carotid disease 05/10/2012  . Dyslipidemia 05/10/2012  . Essential hypertension   . GERD (gastroesophageal reflux disease)   . Bladder cancer ([place])   . Rheumatoid arthritis(714.0)   . Primary bladder malignant neoplasm ([place]) 11/10/2011  . COLONIC POLYPS, ADENOMATOUS 01/26/2008  . HEMORRHOIDS, EXTERNAL 01/26/2008  . GASTRITIS, CHRONIC 01/26/2008  . HIATAL HERNIA 01/26/2008  .  DIVERTICULOSIS OF COLON 01/26/2008  . Atrophic gastritis 01/26/2008    , [place] MPT 05/08/2020, 11:28 AM  [facility] [address] [place], [place], [address] Phone: [phone]   Fax:  [phone]  Name: [name] MRN: [id number] Date of Birth: 07-15-1921

## 2020-05-09 ENCOUNTER — Encounter: Payer: Self-pay | Admitting: Cardiology

## 2020-05-09 NOTE — Assessment & Plan Note (Signed)
Stable on both CT and Doppler.  Recommendation will be to 3-year follow-up however given her advanced age, probably would not recheck.

## 2020-05-09 NOTE — Assessment & Plan Note (Signed)
Mild, soft murmur on exam.  Unless it worsens, I would not further evaluate.

## 2020-05-09 NOTE — Assessment & Plan Note (Addendum)
Stable, rate controlled on diltiazem.  No signs or symptoms of rapid A. fib.  Seems to be well controlled.  No bleeding issues on warfarin.  This patients CHA2DS2-VASc Score and unadjusted Ischemic Stroke Rate (% per year) is equal to 7.2 % stroke rate/year from a score of 5  Above score calculated as 1 point each if present [CHF, HTN, DM, Vascular=MI/PAD/Aortic Plaque, Age if 65-74, or Female] Above score calculated as 2 points each if present [Age > 75, or Stroke/TIA/TE]

## 2020-05-09 NOTE — Assessment & Plan Note (Signed)
She clearly has diastolic dysfunction and if she has rapid A. fib, may note some diastolic heart failure symptoms.  She does not really have any PND or orthopnea.  Relatively controlled edema with taking her furosemide Monday Wednesday Friday double dose.  She has not had to take any additional doses.  We are using diltiazem for rate control as opposed to beta-blocker because of issues with beta-blockers in the past.

## 2020-05-09 NOTE — Assessment & Plan Note (Signed)
On low-dose atorvastatin.  Labs seem to been pretty well controlled.  I do not unfortunately have most recent LDL levels.  Defer to PCP.

## 2020-05-09 NOTE — Assessment & Plan Note (Addendum)
I think her edema is probably more related to chronic venous stasis as opposed to any diastolic heart failure.  Well-controlled on current dose of Lasix.  She is doubling up 3 days a week.--Edema is not associated with PND or orthopnea.

## 2020-05-13 ENCOUNTER — Encounter: Payer: Self-pay | Admitting: Physical Therapy

## 2020-05-13 ENCOUNTER — Ambulatory Visit: Payer: PPO

## 2020-05-13 ENCOUNTER — Other Ambulatory Visit: Payer: Self-pay

## 2020-05-13 ENCOUNTER — Ambulatory Visit: Payer: PPO | Attending: Family Medicine | Admitting: Physical Therapy

## 2020-05-13 DIAGNOSIS — R293 Abnormal posture: Secondary | ICD-10-CM | POA: Diagnosis not present

## 2020-05-13 DIAGNOSIS — M25561 Pain in right knee: Secondary | ICD-10-CM | POA: Insufficient documentation

## 2020-05-13 DIAGNOSIS — G8929 Other chronic pain: Secondary | ICD-10-CM | POA: Diagnosis not present

## 2020-05-13 DIAGNOSIS — M6281 Muscle weakness (generalized): Secondary | ICD-10-CM | POA: Insufficient documentation

## 2020-05-13 DIAGNOSIS — M545 Low back pain, unspecified: Secondary | ICD-10-CM

## 2020-05-13 DIAGNOSIS — Z0000 Encounter for general adult medical examination without abnormal findings: Secondary | ICD-10-CM

## 2020-05-13 NOTE — Patient Instructions (Signed)

## 2020-05-13 NOTE — Progress Notes (Signed)
MEDICARE ANNUAL WELLNESS VISIT  05/13/2020  Telephone Visit Disclaimer This Medicare AWV was conducted by telephone due to national recommendations for restrictions regarding the COVID-19 Pandemic (e.g. social distancing).  I verified, using two identifiers, that I am speaking with [name] or their authorized healthcare agent. I discussed the limitations, risks, security, and privacy concerns of performing an evaluation and management service by telephone and the potential availability of an in-person appointment in the future. The patient expressed understanding and agreed to proceed.   Subjective:  [name] is a 86 y.o. female patient of Janora Norlander, DO who had a Medicare Annual Wellness Visit today via telephone. [name] is Retired and lives with their family. she has three children. she reports that she is not socially active and does interact with friends/family regularly. she is minimally physically active and enjoys coloring, word searches.  Patient Care Team: Janora Norlander, DO as PCP - General (Family Medicine) Leonie Man, MD as PCP - Cardiology (Cardiology) Janora Norlander, DO (Family Medicine)  Advanced Directives 05/13/2020 05/11/2019 11/09/2018 08/30/2018 08/30/2018 01/29/2018 01/28/2018  Does Patient Have a Medical Advance Directive? _0  No No;Yes  Type of Advance Directive Living will;Healthcare Power of [place];Living will [place];Living will [place];Living will [place];Living will - [place];Living will  Does patient want to make changes to medical advance directive? No - Patient declined No - Patient declined - No - Patient declined - - -  Copy of [place] in Chart? - No - copy requested - - - No - copy requested No - copy requested  Would patient like information on creating a medical  advance directive? - - - - - - No - Patient declined  Pre-existing out of facility DNR order (yellow form or pink MOST form) - - - - - - -    Hospital Utilization Over the Past 12 Months: # of hospitalizations or ER visits: 1 # of surgeries: 0  Review of Systems    Patient reports that her overall health is unchanged compared to last year.  Patient had a fall in January. She attends PT weekly for this.  Per daughter patient had a recent visit with her cardiologist and she was instructed that if an ultrasound was ever warranted for an evaluation of patient's aortic aneurysm that it was alright to do so.  Patient's daughter does complain of patient having frequent urination. States that patient drinks large amounts of water daily and patient is on lasix as well. No UTI symptoms. Dr. Jeffie Pollock has prescribed an overactive bladder medication in the past. Suggested that she call his office to discuss. Patient recently had a yearly follow up there for her history of bladder cancer.    Patient Reported Readings (BP, Pulse, CBG, Weight, etc) none  Pain Assessment Pain : 0-10 Pain Type: Acute pain Pain Location: Back Pain Orientation: Left Pain Descriptors / Indicators: Discomfort Pain Onset: More than a month ago Pain Frequency: Constant Pain Relieving Factors: Resting  Pain Relieving Factors: Resting  Current Medications & Allergies (verified) Allergies as of 05/13/2020      Reactions   Contrast Media [iodinated Diagnostic Agents] Hives   Amoxicillin Itching   Ampicillin Itching   Cortisol [hydrocortisone] Itching   Ivp Dye [iodinated Diagnostic Agents] Hives   Levaquin [levofloxacin] Itching, Rash   Penicillins Rash   "haven't had it  in years"   Vancomycin Itching, Rash      Medication List       Accurate as of May 13, 2020 12:20 PM. If you have any questions, ask your nurse or doctor.        atorvastatin 10 MG tablet Commonly known as: LIPITOR Take 10 mg by mouth  daily.   baclofen 10 MG tablet Commonly known as: LIORESAL TAKE 1 TABLET BY MOUTH AT BEDTIME AS NEEDED FOR MUSCLE SPASMS   calcium carbonate 1250 (500 Ca) MG tablet Commonly known as: OS-CAL - dosed in mg of elemental calcium Take 1 tablet by mouth every morning.   cholecalciferol 1000 units tablet Commonly known as: VITAMIN D Take 1,000 Units by mouth daily.   diclofenac Sodium 1 % Gel Commonly known as: VOLTAREN Apply topically as needed.   Dilt-XR 240 MG 24 hr capsule Generic drug: diltiazem DILT-XR 240 mg capsule, extended release  TAKE 1 CAPSULE BY MOUTH EVERY MORNING ON AN EMPTY STOMACH   Fish Oil 1000 MG Caps Take 1,000 mg by mouth daily.   furosemide 20 MG tablet Commonly known as: LASIX Take 1 tablet (20 mg total) by mouth daily. What changed:   how much to take  additional instructions   lidocaine 5 % ointment Commonly known as: XYLOCAINE Apply 1 application topically as needed.   loratadine 10 MG tablet Commonly known as: CLARITIN TAKE 1 TABLET BY MOUTH ONCE DAILY FOR ALLERGY   PRESERVISION AREDS 2+MULTI VIT PO Take by mouth.   multivitamins ther. w/minerals Tabs tablet Take 1 tablet by mouth daily.   olopatadine 0.1 % ophthalmic solution Commonly known as: PATANOL 1 drop 2 (two) times daily.   omeprazole 20 MG capsule Commonly known as: PRILOSEC TAKE 1 CAPSULE BY MOUTH TWICE A DAY   ProAir HFA 108 (90 Base) MCG/ACT inhaler Generic drug: albuterol TAKE 2 PUFFS BY MOUTH EVERY 6 HOURS AS NEEDED FOR WHEEZE OR SHORTNESS OF BREATH   warfarin 3 MG tablet Commonly known as: COUMADIN Take as directed by the anticoagulation clinic. If you are unsure how to take this medication, talk to your nurse or doctor. Original instructions: Take 2 tablets (6m) on Mondays, Wednesdays, Fridays, and Saturdays and 1 tablet (344m on Tuesdays, Thursdays, and Sundays       History (reviewed): Past Medical History:  Diagnosis Date  . AAA (abdominal aortic  aneurysm) ([facility]  . Allergy   . Anxiety   . Bladder cancer ([facility] 2011   Chronic microscopic hematuria; transitional cell cancer  . Blood transfusion 1960   "related to hysterectomy"  . Chronic atrial fibrillation (HCC)    Anticoagulated with warfarin, rate control with diltiazem and Toprol  . Chronic back pain   . Chronic headache    "probably 2/wk" (08/28/2015)  . Concussion w/o coma 03/21/97/3382 Complicated by subarachnoid hemorrhage.  "even now has times when she's not able to comprehend" (05/08/12)  . Coronary artery disease    Nonobstructive  . Diverticulitis   . Diverticulosis   . DVT (deep venous thrombosis), right 2005   "right calf after 8 foot fall"  . Edema of both legs    Chronic, thought to be secondary to DVTs  . External hemorrhoids   . Family history of adverse reaction to anesthesia 2012   daughter "had OR for crushed hand; had problems w/anesthesia & I was in there all day long" (  . Fatty liver   . Frequent UTI   . GERD (gastroesophageal reflux  disease)   . H/O hiatal hernia   . High cholesterol   . Migraine    "rare now" (08/28/2015)  . Pneumonia ~ 2010;  2013; 07/2015  . Rheumatoid arthritis(714.0)    "hands" (08/28/2015)  . Urinary frequency    Past Surgical History:  Procedure Laterality Date  . BREAST CYST EXCISION Left 1960's?   2 cysts; benign  . CARDIAC CATHETERIZATION  1980's  . CATARACT EXTRACTION W/ INTRAOCULAR LENS  IMPLANT, BILATERAL Bilateral 1992  . CYSTOSCOPY  11/11/2011   Procedure: CYSTOSCOPY;  Surgeon: Malka So;  Location: [facility];  Service: Urology;  Laterality: N/A;  . CYSTOSTOMY W/ BLADDER BIOPSY  10/08/2013  . INCONTINENCE SURGERY  1980's  . JOINT REPLACEMENT    . KNEE ARTHROSCOPY Right 2005   S/P fall  . Lower Extremity Venous Doppler  11/10/2013   No DVT or superficial thrombus enlarged inguinal lymph node noted in the right. No Baker's cyst.  . TONSILLECTOMY  1952  . TOTAL KNEE ARTHROPLASTY Right 02/05/2013    Procedure: TOTAL KNEE ARTHROPLASTY;  Surgeon: Mauri Pole, MD;  Location: [facility];  Service: Orthopedics;  Laterality: Right;  . TRANSTHORACIC ECHOCARDIOGRAM  01/'15; 4/'19   a) EF 60-65%. NO RWMA. Ao Sclerosis. MAC - no MS with mild MR>  B Atriae mildly dilated;; b)  EF 55-60%. AoV Sclerosis (no stenosis).  Trivial AI & MR. Mild LAE & Severe RAE.. Mild RV dilation  . TRANSURETHRAL RESECTION OF BLADDER TUMOR  11/11/2011   Procedure: TRANSURETHRAL RESECTION OF BLADDER TUMOR (TURBT);  Surgeon: Malka So;  Location: [facility];  Service: Urology;  Laterality: N/A;  Cysto, Bladder Biopsy, TURBT with Gyrus,   . TRANSURETHRAL RESECTION OF BLADDER TUMOR WITH GYRUS (TURBT-GYRUS)  2007; 2009; 2010   "for tumors on surface of bladder"  . TUMOR EXCISION  1976; 1980's   "fatty tumor cut off her upper back; left thumb"  . VAGINAL HYSTERECTOMY  1960   partial    Family History  Problem Relation Age of Onset  . Anesthesia problems Daughter   . Cancer Mother        uterine  . Cancer Sister        leukemia  . Cancer Brother   . Prostate cancer Brother   . Cancer Brother   . Breast cancer Other        ne[place]  . Heart disease Brother   . Cancer Brother   . Heart attack Sister   . Colon cancer Neg Hx    Social History   Socioeconomic History  . Marital status: Widowed    Spouse name: Not on file  . Number of children: 3  . Years of education: Not on file  . Highest education level: 8th grade  Occupational History  . Occupation: Retired  Tobacco Use  . Smoking status: Former Smoker    Packs/day: 1.00    Years: 40.00    Pack years: 40.00    Types: Cigarettes    Quit date: 11/07/1982    Years since quitting: 37.5  . Smokeless tobacco: Former Systems developer    Types: Snuff  Substance and Sexual Activity  . Alcohol use: Yes    Alcohol/week: 7.0 standard drinks    Types: 7 Glasses of wine per week    Comment: 1 glass of wine prior to bed  . Drug use: No  . Sexual activity: Not Currently    Birth  control/protection: Surgical  Other Topics Concern  . Not on file  Social History Narrative   ** Merged History Encounter **       She is a widowed mother of 91, grandmother of 22, great grandmother of 30. She does not   really get routine exercise. She quit smoking in 1983. She does not smoke and does not use illicit drugs and does not drink.   Social Determinants of Health   Financial Resource Strain:   . Difficulty of Paying Living Expenses:   Food Insecurity:   . Worried About Charity fundraiser in the Last Year:   . Arboriculturist in the Last Year:   Transportation Needs:   . Film/video editor (Medical):   Marland Kitchen Lack of Transportation (Non-Medical):   Physical Activity:   . Days of Exercise per Week:   . Minutes of Exercise per Session:   Stress:   . Feeling of Stress :   Social Connections:   . Frequency of Communication with Friends and Family:   . Frequency of Social Gatherings with Friends and Family:   . Attends Religious Services:   . Active Member of Clubs or Organizations:   . Attends Archivist Meetings:   Marland Kitchen Marital Status:     Activities of Daily Living In your present state of health, do you have any difficulty performing the following activities: 05/13/2020 05/13/2020  Hearing? Y N  Vision? - Y  Difficulty concentrating or making decisions? - Y  Walking or climbing stairs? - Y  Dressing or bathing? - N  Doing errands, shopping? - Y  Preparing Food and eating ? N Y  Using the Toilet? - N  In the past six months, have you accidently leaked urine? - Y  Do you have problems with loss of bowel control? - N  Managing your Medications? - N  Managing your Finances? - Y  Housekeeping or managing your Housekeeping? - Y  Some recent data might be hidden    Patient Education/ Literacy How often do you need to have someone help you when you read instructions, pamphlets, or other written materials from your doctor or pharmacy?: 3 -  Sometimes  Exercise Current Exercise Habits: The patient does not participate in regular exercise at present, Exercise limited by: orthopedic condition(s)  Diet Patient reports consuming 3 meals a day and 0 snack(s) a day Patient reports that her primary diet is: Regular Patient reports that she does have regular access to food.   Depression Screen PHQ 2/9 Scores 05/13/2020 05/02/2020 04/14/2020 03/21/2020 03/12/2020 11/01/2019 09/26/2019  PHQ - 2 Score 0 0 0 0 0 0 0  PHQ- 9 Score - - - - 0 - 0     Fall Risk Fall Risk  05/13/2020 05/02/2020 04/14/2020 03/21/2020 03/12/2020  Falls in the past year? 1 1 - 1 0  Number falls in past yr: 0 0 1 1 -  Injury with Fall? 1 0 0 0 -  Risk Factor Category  - - - - -  Risk for fall due to : No Fall Risks - Impaired balance/gait;Impaired mobility Impaired balance/gait;Impaired mobility -  Follow up Falls evaluation completed - Falls evaluation completed Falls evaluation completed -     Objective:  [name] seemed alert and oriented and she participated appropriately during our telephone visit.  Blood Pressure Weight BMI  BP Readings from Last 3 Encounters:  05/05/20 138/62  05/02/20 128/71  04/14/20 (!) 144/77   Wt Readings from Last 3 Encounters:  05/05/20 153 lb (69.4 kg)  05/02/20 154 lb (69.9 kg)  04/14/20 154 lb (69.9 kg)   BMI Readings from Last 1 Encounters:  05/05/20 25.46 kg/m    *Unable to obtain current vital signs, weight, and BMI due to telephone visit type  Hearing/Vision  . [name] did  seem to have difficulty with hearing/understanding during the telephone conversation . Reports that she has not had a formal eye exam by an eye care professional within the past year . Reports that she has not had a formal hearing evaluation within the past year *Unable to fully assess hearing and vision during telephone visit type  Cognitive Function: 6CIT Screen 05/13/2020 05/11/2019  What Year? 0 points 0 points  What month? 0 points 0  points  What time? 0 points 0 points  Count back from 20 0 points 0 points  Months in reverse 2 points 0 points  Repeat phrase 10 points 2 points  Total Score 12 2   (Normal:0-7, Significant for Dysfunction: >8)  Normal Cognitive Function Screening: No: 12   Immunization & Health Maintenance Record Immunization History  Administered Date(s) Administered  . Fluad Quad(high Dose 65+) 09/26/2019  . Influenza, High Dose Seasonal PF 09/17/2017  . Influenza-Unspecified 09/25/1996, 09/17/1997, 10/31/2004  . Pneumococcal-Unspecified 08/14/2011    Health Maintenance  Topic Date Due  . COVID-19 Vaccine (1) Never done  . TETANUS/TDAP  Never done  . INFLUENZA VACCINE  07/13/2020  . DEXA SCAN  Completed  . PNA vac Low Risk Adult  Completed       Assessment  This is a routine wellness examination for [name].  Health Maintenance: Due or Overdue Health Maintenance Due  Topic Date Due  . COVID-19 Vaccine (1) Never done  . TETANUS/TDAP  Never done    [name] does not need a referral for Community Assistance: Care Management:   no Social Work:    no Prescription Assistance:  no Nutrition/Diabetes Education:  no   Plan:  Personalized Goals Goals Addressed            This Visit's Progress   . Prevent falls        Personalized Health Maintenance & Screening Recommendations    Lung Cancer Screening Recommended: no (Low Dose CT Chest recommended if Age 87-80 years, 30 pack-year currently smoking OR have quit w/in past 15 years) Hepatitis C Screening recommended: no HIV Screening recommended: no  Advanced Directives: Written information was not prepared per patient's request.  Referrals & Orders No orders of the defined types were placed in this encounter.   Follow-up Plan . Follow-up with Janora Norlander, DO as planned . Schedule 07/02/2020    I have personally reviewed and noted the following in the patient's chart:   . Medical and  social history . Use of alcohol, tobacco or illicit drugs  . Current medications and supplements . Functional ability and status . Nutritional status . Physical activity . Advanced directives . List of other physicians . Hospitalizations, surgeries, and ER visits in previous 12 months . Vitals . Screenings to include cognitive, depression, and falls . Referrals and appointments  In addition, I have reviewed and discussed with [name] certain preventive protocols, quality metrics, and best practice recommendations. A written personalized care plan for preventive services as well as general preventive health recommendations is available and can be mailed to the patient at her request.      [name] [facility]  08/20/3531

## 2020-05-13 NOTE — Therapy (Signed)
[facility] [place], [place], [address] Phone: [phone]   Fax:  [phone]  Physical Therapy Treatment  Patient Details  Name: [name] MRN: [id number] Date of Birth: 08-05-1921 Referring Provider (PT): Caryl Pina MD   Encounter Date: 05/13/2020  PT End of Session - 05/13/20 1121    Visit Number  5    Number of Visits  12    Date for PT Re-Evaluation  07/23/20    PT Start Time  7408    PT Stop Time  1448    PT Time Calculation (min)  41 min       Past Medical History:  Diagnosis Date  . AAA (abdominal aortic aneurysm) ([place])   . Allergy   . Anxiety   . Bladder cancer ([place]) dx'd 2011   Chronic microscopic hematuria; transitional cell cancer  . Blood transfusion 1960   "related to hysterectomy"  . Chronic atrial fibrillation (HCC)    Anticoagulated with warfarin, rate control with diltiazem and Toprol  . Chronic back pain   . Chronic headache    "probably 2/wk" (08/28/2015)  . Concussion w/o coma 1/85/6314   Complicated by subarachnoid hemorrhage.  "even now has times when she's not able to comprehend" (05/08/12)  . Coronary artery disease    Nonobstructive  . Diverticulitis   . Diverticulosis   . DVT (deep venous thrombosis), right 2005   "right calf after 8 foot fall"  . Edema of both legs    Chronic, thought to be secondary to DVTs  . External hemorrhoids   . Family history of adverse reaction to anesthesia 2012   daughter "had OR for crushed hand; had problems w/anesthesia & I was in there all day long" (  . Fatty liver   . Frequent UTI   . GERD (gastroesophageal reflux disease)   . H/O hiatal hernia   . High cholesterol   . Migraine    "rare now" (08/28/2015)  . Pneumonia ~ 2010;  2013; 07/2015  . Rheumatoid arthritis(714.0)    "hands" (08/28/2015)  . Urinary frequency     Past Surgical History:  Procedure Laterality Date  . BREAST CYST EXCISION Left 1960's?   2 cysts; benign   . CARDIAC CATHETERIZATION  1980's  . CATARACT EXTRACTION W/ INTRAOCULAR LENS  IMPLANT, BILATERAL Bilateral 1992  . CYSTOSCOPY  11/11/2011   Procedure: CYSTOSCOPY;  Surgeon: Malka So;  Location: [facility];  Service: Urology;  Laterality: N/A;  . CYSTOSTOMY W/ BLADDER BIOPSY  10/08/2013  . INCONTINENCE SURGERY  1980's  . JOINT REPLACEMENT    . KNEE ARTHROSCOPY Right 2005   S/P fall  . Lower Extremity Venous Doppler  11/10/2013   No DVT or superficial thrombus enlarged inguinal lymph node noted in the right. No Baker's cyst.  . TONSILLECTOMY  1952  . TOTAL KNEE ARTHROPLASTY Right 02/05/2013   Procedure: TOTAL KNEE ARTHROPLASTY;  Surgeon: Mauri Pole, MD;  Location: [facility];  Service: Orthopedics;  Laterality: Right;  . TRANSTHORACIC ECHOCARDIOGRAM  01/'15; 4/'19   a) EF 60-65%. NO RWMA. Ao Sclerosis. MAC - no MS with mild MR>  B Atriae mildly dilated;; b)  EF 55-60%. AoV Sclerosis (no stenosis).  Trivial AI & MR. Mild LAE & Severe RAE.. Mild RV dilation  . TRANSURETHRAL RESECTION OF BLADDER TUMOR  11/11/2011   Procedure: TRANSURETHRAL RESECTION OF BLADDER TUMOR (TURBT);  Surgeon: Malka So;  Location: [facility];  Service: Urology;  Laterality: N/A;  Cysto, Bladder  Biopsy, TURBT with Gyrus,   . TRANSURETHRAL RESECTION OF BLADDER TUMOR WITH GYRUS (TURBT-GYRUS)  2007; 2009; 2010   "for tumors on surface of bladder"  . TUMOR EXCISION  1976; 1980's   "fatty tumor cut off her upper back; left thumb"  . VAGINAL HYSTERECTOMY  1960   partial     There were no vitals filed for this visit.  Subjective Assessment - 05/13/20 1116    Subjective  COVID-19 screen performed prior to patient entering clinic. Patient arrived with ongoing pain in back. "Had a tough weekend"    Pertinent History  AAA, Right TKA, CAD, DVT, LE edema.    How long can you walk comfortably?  Short community distnaces with a rolling walker.    Diagnostic tests  X-ray.    Patient Stated Goals  Get out of pain.    Currently in  Pain?  Yes    Pain Score  --   no number provided   Pain Location  Back    Pain Orientation  Left;Lower    Pain Descriptors / Indicators  Discomfort    Pain Type  Acute pain    Pain Onset  More than a month ago    Pain Frequency  Constant    Aggravating Factors   increased standing activity    Pain Relieving Factors  at rest                        [facility] Adult PT Treatment/Exercise - 05/13/20 0001      Knee/Hip Exercises: Aerobic   Nustep  Level 3 x 21mn UE/LE activity, monitored for progression      Moist Heat Therapy   Number Minutes Moist Heat  15 Minutes    Moist Heat Location  Lumbar Spine      Electrical Stimulation   Electrical Stimulation Location  Left low back.    Electrical Stimulation Action  premod    Electrical Stimulation Parameters  80-_0  x163m    Electrical Stimulation Goals  Pain      Manual Therapy   Manual Therapy  Soft tissue mobilization    Manual therapy comments  manual STW to left low back/glut to reduce pain and tone             PT Education - 05/13/20 1140    Education Details  HEP for posture awareness techniques    Person(s) Educated  Patient    Methods  Explanation;Demonstration;Handout    Comprehension  Verbalized understanding          PT Long Term Goals - 05/13/20 1123      PT LONG TERM GOAL #1   Title  Independent with a HEP.    Time  6    Period  Weeks    Status  On-going      PT LONG TERM GOAL #2   Title  Sit to stand with pain not > 3/10.    Time  6    Period  Weeks    Status  On-going   faces 6-8/10 05/13/20     PT LONG TERM GOAL #3   Title  Supine to sit with pain not > 3/10.    Time  6    Period  Weeks    Status  On-going   faces 6-8/10 05/13/20           Plan - 05/13/20 1125    Clinical Impression Statement  Patient tolerated treatment fair due to ongoing  pain in left low back. Patient has more pain with transition movements up to 6-8/10 with faces scale. Patient reported relief  from treatments. Educated patient on posture awareness techniques , bending and logroll today with HEP provided. Patient did have difficulty with techniques and will require continued education. Current goals ongoing due to pain limitations.    Personal Factors and Comorbidities  Comorbidity 1;Comorbidity 3+;Comorbidity 2    Comorbidities  AAA, Right TKA, CAD, DVT, LE edema.    Examination-Activity Limitations  Other;Bed Mobility;Locomotion Level    Examination-Participation Restrictions  Other    Stability/Clinical Decision Making  Evolving/Moderate complexity    Rehab Potential  Good    PT Frequency  2x / week    PT Duration  6 weeks    PT Treatment/Interventions  ADLs/Self Care Home Management;Cryotherapy;Electrical Stimulation;Moist Heat;Ultrasound;Therapeutic exercise;Therapeutic activities;Functional mobility training;Patient/family education;Manual techniques;Dry needling;Passive range of motion    PT Next Visit Plan  cont with POC for  U/S, STW/M, E'stim.  Core exercise progression.    Consulted and Agree with Plan of Care  Patient       Patient will benefit from skilled therapeutic intervention in order to improve the following deficits and impairments:  Pain, Decreased activity tolerance, Decreased range of motion, Increased muscle spasms, Postural dysfunction  Visit Diagnosis: Acute left-sided low back pain without sciatica  Abnormal posture     Problem List Patient Active Problem List   Diagnosis Date Noted  . Precordial pain 08/30/2018  . Reactive airway disease with acute exacerbation 01/28/2018  . Left ventricular diastolic dysfunction, NYHA class 2 12/21/2013  . Non-rheumatic aortic sclerosis 12/21/2013  . AAA (abdominal aortic aneurysm) ([place]) 12/21/2013  . Abdominal aortic aneurysm ([place]) 12/21/2013  . 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 Permanent atrial fibrillation ([place])   . Edema of both legs   . 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 PVD, moderate bilat carotid  disease 05/10/2012  . Dyslipidemia 05/10/2012  . Essential hypertension   . GERD (gastroesophageal reflux disease)   . Bladder cancer ([place])   . Rheumatoid arthritis(714.0)   . Primary bladder malignant neoplasm ([place]) 11/10/2011  . COLONIC POLYPS, ADENOMATOUS 01/26/2008  . HEMORRHOIDS, EXTERNAL 01/26/2008  . GASTRITIS, CHRONIC 01/26/2008  . HIATAL HERNIA 01/26/2008  . DIVERTICULOSIS OF COLON 01/26/2008  . Atrophic gastritis 01/26/2008    ,  P, PTA 05/13/2020, 11:57 AM  [facility] [place], [place], [address] Phone: [phone]   Fax:  [phone]  Name: [name] MRN: [id number] Date of Birth: 05-31-1921

## 2020-05-13 NOTE — Patient Instructions (Signed)
  [facility] Maintenance Summary and Written Plan of Care  Ms. [name] ,  Thank you for allowing me to perform your Medicare Annual Wellness Visit and for your ongoing commitment to your health.   Health Maintenance & Immunization History Health Maintenance  Topic Date Due  . COVID-19 Vaccine (1) Never done  . TETANUS/TDAP  Never done  . INFLUENZA VACCINE  07/13/2020  . DEXA SCAN  Completed  . PNA vac Low Risk Adult  Completed   Immunization History  Administered Date(s) Administered  . Fluad Quad(high Dose 65+) 09/26/2019  . Influenza, High Dose Seasonal PF 09/17/2017  . Influenza-Unspecified 09/25/1996, 09/17/1997, 10/31/2004  . Pneumococcal-Unspecified 08/14/2011    These are the patient goals that we discussed: Goals Addressed            This Visit's Progress   . Prevent falls          This is a list of Health Maintenance Items that are overdue or due now: Health Maintenance Due  Topic Date Due  . COVID-19 Vaccine (1) Never done  . TETANUS/TDAP  Never done     Orders/Referrals Placed Today: No orders of the defined types were placed in this encounter.  (Contact our referral department at [phone] if you have not spoken with someone about your referral appointment within the next 5 days)    Follow-up Plan  Scheduled with Dr. Lajuana Ripple 07/02/2020 8:15am.

## 2020-05-16 ENCOUNTER — Other Ambulatory Visit: Payer: Self-pay

## 2020-05-16 ENCOUNTER — Ambulatory Visit: Payer: PPO | Admitting: Physical Therapy

## 2020-05-16 DIAGNOSIS — R293 Abnormal posture: Secondary | ICD-10-CM

## 2020-05-16 DIAGNOSIS — M545 Low back pain, unspecified: Secondary | ICD-10-CM

## 2020-05-16 NOTE — Therapy (Signed)
[facility] [place], [place], [address] Phone: [phone]   Fax:  [phone]  Physical Therapy Treatment  Patient Details  Name: [name] MRN: [id number] Date of Birth: 1921-04-07 Referring Provider (PT): Caryl Pina MD   Encounter Date: 05/16/2020  PT End of Session - 05/16/20 1202    Visit Number  6    Number of Visits  12    Date for PT Re-Evaluation  07/23/20    PT Start Time  1115    PT Stop Time  1211    PT Time Calculation (min)  56 min    Activity Tolerance  Patient tolerated treatment well    Behavior During Therapy  [facility] for tasks assessed/performed       Past Medical History:  Diagnosis Date  . AAA (abdominal aortic aneurysm) ([place])   . Allergy   . Anxiety   . Bladder cancer ([place]) dx'd 2011   Chronic microscopic hematuria; transitional cell cancer  . Blood transfusion 1960   "related to hysterectomy"  . Chronic atrial fibrillation (HCC)    Anticoagulated with warfarin, rate control with diltiazem and Toprol  . Chronic back pain   . Chronic headache    "probably 2/wk" (08/28/2015)  . Concussion w/o coma 2/99/2426   Complicated by subarachnoid hemorrhage.  "even now has times when she's not able to comprehend" (05/08/12)  . Coronary artery disease    Nonobstructive  . Diverticulitis   . Diverticulosis   . DVT (deep venous thrombosis), right 2005   "right calf after 8 foot fall"  . Edema of both legs    Chronic, thought to be secondary to DVTs  . External hemorrhoids   . Family history of adverse reaction to anesthesia 2012   daughter "had OR for crushed hand; had problems w/anesthesia & I was in there all day long" (  . Fatty liver   . Frequent UTI   . GERD (gastroesophageal reflux disease)   . H/O hiatal hernia   . High cholesterol   . Migraine    "rare now" (08/28/2015)  . Pneumonia ~ 2010;  2013; 07/2015  . Rheumatoid arthritis(714.0)    "hands" (08/28/2015)  . Urinary  frequency     Past Surgical History:  Procedure Laterality Date  . BREAST CYST EXCISION Left 1960's?   2 cysts; benign  . CARDIAC CATHETERIZATION  1980's  . CATARACT EXTRACTION W/ INTRAOCULAR LENS  IMPLANT, BILATERAL Bilateral 1992  . CYSTOSCOPY  11/11/2011   Procedure: CYSTOSCOPY;  Surgeon: Malka So;  Location: [facility];  Service: Urology;  Laterality: N/A;  . CYSTOSTOMY W/ BLADDER BIOPSY  10/08/2013  . INCONTINENCE SURGERY  1980's  . JOINT REPLACEMENT    . KNEE ARTHROSCOPY Right 2005   S/P fall  . Lower Extremity Venous Doppler  11/10/2013   No DVT or superficial thrombus enlarged inguinal lymph node noted in the right. No Baker's cyst.  . TONSILLECTOMY  1952  . TOTAL KNEE ARTHROPLASTY Right 02/05/2013   Procedure: TOTAL KNEE ARTHROPLASTY;  Surgeon: Mauri Pole, MD;  Location: [facility];  Service: Orthopedics;  Laterality: Right;  . TRANSTHORACIC ECHOCARDIOGRAM  01/'15; 4/'19   a) EF 60-65%. NO RWMA. Ao Sclerosis. MAC - no MS with mild MR>  B Atriae mildly dilated;; b)  EF 55-60%. AoV Sclerosis (no stenosis).  Trivial AI & MR. Mild LAE & Severe RAE.. Mild RV dilation  . TRANSURETHRAL RESECTION OF BLADDER TUMOR  11/11/2011   Procedure: TRANSURETHRAL RESECTION OF  BLADDER TUMOR (TURBT);  Surgeon: Malka So;  Location: [facility];  Service: Urology;  Laterality: N/A;  Cysto, Bladder Biopsy, TURBT with Gyrus,   . TRANSURETHRAL RESECTION OF BLADDER TUMOR WITH GYRUS (TURBT-GYRUS)  2007; 2009; 2010   "for tumors on surface of bladder"  . TUMOR EXCISION  1976; 1980's   "fatty tumor cut off her upper back; left thumb"  . VAGINAL HYSTERECTOMY  1960   partial     There were no vitals filed for this visit.  Subjective Assessment - 05/16/20 1128    Subjective  COVID-19 screen performed prior to patient entering clinic.  Doing okay.    Pertinent History  AAA, Right TKA, CAD, DVT, LE edema.    How long can you walk comfortably?  Short community distnaces with a rolling walker.    Diagnostic  tests  X-ray.    Patient Stated Goals  Get out of pain.    Pain Score  6     Pain Location  Back    Pain Orientation  Left    Pain Descriptors / Indicators  Discomfort    Pain Type  Acute pain    Pain Onset  More than a month ago                        [facility] Adult PT Treatment/Exercise - 05/16/20 0001      Exercises   Exercises  Knee/Hip      Knee/Hip Exercises: Aerobic   Nustep  Level 3 x 15 minutes.      Moist Heat Therapy   Number Minutes Moist Heat  20 Minutes    Moist Heat Location  Lumbar Spine      Electrical Stimulation   Electrical Stimulation Location  Left low back.    Electrical Stimulation Action  Pre-mod.    Electrical Stimulation Parameters  80-150 Hz x 20 minutes.    Electrical Stimulation Goals  Pain      Manual Therapy   Manual Therapy  Soft tissue mobilization    Soft tissue mobilization  RT SDLY position:  STW/M to patient's left low back and upper gluteal musculature including ischemic release technique x 9 minutes.                  PT Long Term Goals - 05/13/20 1123      PT LONG TERM GOAL #1   Title  Independent with a HEP.    Time  6    Period  Weeks    Status  On-going      PT LONG TERM GOAL #2   Title  Sit to stand with pain not > 3/10.    Time  6    Period  Weeks    Status  On-going   faces 6-8/10 05/13/20     PT LONG TERM GOAL #3   Title  Supine to sit with pain not > 3/10.    Time  6    Period  Weeks    Status  On-going   faces 6-8/10 05/13/20           Plan - 05/16/20 1204    Clinical Impression Statement  Patient feeling some better today.  Her left QL, however, remains tender and taut to palpation.    Personal Factors and Comorbidities  Comorbidity 1;Comorbidity 3+;Comorbidity 2    Comorbidities  AAA, Right TKA, CAD, DVT, LE edema.    Examination-Activity Limitations  Other;Bed Mobility;Locomotion Level  Examination-Participation Restrictions  Other    Stability/Clinical Decision Making   Evolving/Moderate complexity    Rehab Potential  Good    PT Frequency  2x / week    PT Duration  6 weeks    PT Treatment/Interventions  ADLs/Self Care Home Management;Cryotherapy;Electrical Stimulation;Moist Heat;Ultrasound;Therapeutic exercise;Therapeutic activities;Functional mobility training;Patient/family education;Manual techniques;Dry needling;Passive range of motion    PT Next Visit Plan  cont with POC for  U/S, STW/M, E'stim.  Core exercise progression.    Consulted and Agree with Plan of Care  Patient       Patient will benefit from skilled therapeutic intervention in order to improve the following deficits and impairments:  Pain, Decreased activity tolerance, Decreased range of motion, Increased muscle spasms, Postural dysfunction  Visit Diagnosis: Acute left-sided low back pain without sciatica  Abnormal posture     Problem List Patient Active Problem List   Diagnosis Date Noted  . Precordial pain 08/30/2018  . Reactive airway disease with acute exacerbation 01/28/2018  . Left ventricular diastolic dysfunction, NYHA class 2 12/21/2013  . Non-rheumatic aortic sclerosis 12/21/2013  . AAA (abdominal aortic aneurysm) ([place]) 12/21/2013  . Abdominal aortic aneurysm ([place]) 12/21/2013  . 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 Permanent atrial fibrillation ([place])   . Edema of both legs   . 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 PVD, moderate bilat carotid disease 05/10/2012  . Dyslipidemia 05/10/2012  . Essential hypertension   . GERD (gastroesophageal reflux disease)   . Bladder cancer ([place])   . Rheumatoid arthritis(714.0)   . Primary bladder malignant neoplasm ([place]) 11/10/2011  . COLONIC POLYPS, ADENOMATOUS 01/26/2008  . HEMORRHOIDS, EXTERNAL 01/26/2008  . GASTRITIS, CHRONIC 01/26/2008  . HIATAL HERNIA 01/26/2008  . DIVERTICULOSIS OF COLON 01/26/2008  . Atrophic gastritis 01/26/2008    , [place] MPT 05/16/2020, 12:16 PM  [facility] [address] [place], [place], [address] Phone: [phone]   Fax:  [phone]  Name: SUMMERS BUENDIA MRN: [id number] Date of Birth: [birth year]

## 2020-05-21 ENCOUNTER — Ambulatory Visit: Payer: PPO | Admitting: Physical Therapy

## 2020-05-22 ENCOUNTER — Ambulatory Visit: Payer: PPO | Admitting: Physical Therapy

## 2020-05-26 ENCOUNTER — Other Ambulatory Visit: Payer: Self-pay

## 2020-05-26 ENCOUNTER — Ambulatory Visit: Payer: PPO

## 2020-05-26 DIAGNOSIS — M545 Low back pain, unspecified: Secondary | ICD-10-CM

## 2020-05-26 DIAGNOSIS — R293 Abnormal posture: Secondary | ICD-10-CM

## 2020-05-26 NOTE — Therapy (Signed)
[facility] [place], [place], [address] Phone: [phone]   Fax:  [phone]  Physical Therapy Treatment  Patient Details  Name: [name] MRN: [id number] Date of Birth: 1921/12/12 Referring Provider (PT): Caryl Pina MD   Encounter Date: 05/26/2020   PT End of Session - 05/26/20 1122    Visit Number 7    Number of Visits 12    Date for PT Re-Evaluation 07/23/20    PT Start Time 1116   10' on Nustep, not included with charges   PT Stop Time 1210    PT Time Calculation (min) 54 min    Equipment Utilized During Treatment Other (comment)   FWW   Activity Tolerance Patient tolerated treatment well;No increased pain    Behavior During Therapy WFL for tasks assessed/performed           Past Medical History:  Diagnosis Date  . AAA (abdominal aortic aneurysm) ([place])   . Allergy   . Anxiety   . Bladder cancer ([place]) dx'd 2011   Chronic microscopic hematuria; transitional cell cancer  . Blood transfusion 1960   "related to hysterectomy"  . Chronic atrial fibrillation (HCC)    Anticoagulated with warfarin, rate control with diltiazem and Toprol  . Chronic back pain   . Chronic headache    "probably 2/wk" (08/28/2015)  . Concussion w/o coma 5/83/0940   Complicated by subarachnoid hemorrhage.  "even now has times when she's not able to comprehend" (05/08/12)  . Coronary artery disease    Nonobstructive  . Diverticulitis   . Diverticulosis   . DVT (deep venous thrombosis), right 2005   "right calf after 8 foot fall"  . Edema of both legs    Chronic, thought to be secondary to DVTs  . External hemorrhoids   . Family history of adverse reaction to anesthesia 2012   daughter "had OR for crushed hand; had problems w/anesthesia & I was in there all day long" (  . Fatty liver   . Frequent UTI   . GERD (gastroesophageal reflux disease)   . H/O hiatal hernia   . High cholesterol   . Migraine    "rare now"  (08/28/2015)  . Pneumonia ~ 2010;  2013; 07/2015  . Rheumatoid arthritis(714.0)    "hands" (08/28/2015)  . Urinary frequency     Past Surgical History:  Procedure Laterality Date  . BREAST CYST EXCISION Left 1960's?   2 cysts; benign  . CARDIAC CATHETERIZATION  1980's  . CATARACT EXTRACTION W/ INTRAOCULAR LENS  IMPLANT, BILATERAL Bilateral 1992  . CYSTOSCOPY  11/11/2011   Procedure: CYSTOSCOPY;  Surgeon: Malka So;  Location: [facility];  Service: Urology;  Laterality: N/A;  . CYSTOSTOMY W/ BLADDER BIOPSY  10/08/2013  . INCONTINENCE SURGERY  1980's  . JOINT REPLACEMENT    . KNEE ARTHROSCOPY Right 2005   S/P fall  . Lower Extremity Venous Doppler  11/10/2013   No DVT or superficial thrombus enlarged inguinal lymph node noted in the right. No Baker's cyst.  . TONSILLECTOMY  1952  . TOTAL KNEE ARTHROPLASTY Right 02/05/2013   Procedure: TOTAL KNEE ARTHROPLASTY;  Surgeon: Mauri Pole, MD;  Location: [facility];  Service: Orthopedics;  Laterality: Right;  . TRANSTHORACIC ECHOCARDIOGRAM  01/'15; 4/'19   a) EF 60-65%. NO RWMA. Ao Sclerosis. MAC - no MS with mild MR>  B Atriae mildly dilated;; b)  EF 55-60%. AoV Sclerosis (no stenosis).  Trivial AI & MR. Mild LAE & Severe RAE.Marland Kitchen  Mild RV dilation  . TRANSURETHRAL RESECTION OF BLADDER TUMOR  11/11/2011   Procedure: TRANSURETHRAL RESECTION OF BLADDER TUMOR (TURBT);  Surgeon: Malka So;  Location: [facility];  Service: Urology;  Laterality: N/A;  Cysto, Bladder Biopsy, TURBT with Gyrus,   . TRANSURETHRAL RESECTION OF BLADDER TUMOR WITH GYRUS (TURBT-GYRUS)  2007; 2009; 2010   "for tumors on surface of bladder"  . TUMOR EXCISION  1976; 1980's   "fatty tumor cut off her upper back; left thumb"  . VAGINAL HYSTERECTOMY  1960   partial     There were no vitals filed for this visit.   Subjective Assessment - 05/26/20 1121    Subjective COVID-19 screen performed prior to patient entering clinic.  Pt stated her LBP is feeling a little better    Pertinent  History AAA, Right TKA, CAD, DVT, LE edema.    Patient Stated Goals Get out of pain.    Currently in Pain? Yes    Pain Score 5     Pain Location Back    Pain Orientation Lower;Left    Pain Descriptors / Indicators Discomfort    Pain Type Acute pain    Pain Onset More than a month ago    Pain Frequency Constant    Aggravating Factors  increased standing activity    Pain Relieving Factors resting    Effect of Pain on Daily Activities limits                             [facility] Adult PT Treatment/Exercise - 05/26/20 0001      Bed Mobility   Bed Mobility Right Sidelying to Sit;Left Sidelying to Sit;Sit to Sidelying Right;Sit to Sidelying Left    Rolling Right Supervision/verbal cueing    Rolling Left Supervision/Verbal cueing    Right Sidelying to Sit Supervision/Verbal cueing    Left Sidelying to Sit Supervision/Verbal cueing    Sit to Sidelying Right Supervision/Verbal cueing    Sit to Sidelying Left Supervision/Verbal cueing      Knee/Hip Exercises: Aerobic   Nustep Level 3 x 10 minutes.      Knee/Hip Exercises: Supine   Other Supine Knee/Hip Exercises Decompression 1-5      Moist Heat Therapy   Number Minutes Moist Heat 15 Minutes    Moist Heat Location Lumbar Spine      Electrical Stimulation   Electrical Stimulation Location Left low back.    Consulting civil engineer Parameters 80-150 Hz x 15 minutes    Electrical Stimulation Goals Pain      Manual Therapy   Manual Therapy Soft tissue mobilization    Soft tissue mobilization RT SDLY position:  STW/M to patient's left low back and upper gluteal musculature including ischemic release technique x 9 minutes.                  PT Education - 05/26/20 1157    Education Details Reviewed bed mobility.  Educated benefits with compression garment for edema control    Person(s) Educated Patient    Methods Explanation    Comprehension Verbalized understanding                 PT Long Term Goals - 05/13/20 1123      PT LONG TERM GOAL #1   Title Independent with a HEP.    Time 6    Period Weeks    Status On-going  PT LONG TERM GOAL #2   Title Sit to stand with pain not > 3/10.    Time 6    Period Weeks    Status On-going   faces 6-8/10 05/13/20     PT LONG TERM GOAL #3   Title Supine to sit with pain not > 3/10.    Time 6    Period Weeks    Status On-going   faces 6-8/10 05/13/20                Plan - 05/26/20 1214    Clinical Impression Statement Reviewed bed mobility to reduce stress on lumbar mm with verbal and tactile cueing.  Pt's Lt [facility] continues to remain tender and taut to palpation.  Added decompression exercises for postural strengthening and to improve tolerance with supine position.  Also educated on benefits of compression garments to address edema present BLE- will need more education on this.    Personal Factors and Comorbidities Comorbidity 1;Comorbidity 3+;Comorbidity 2    Comorbidities AAA, Right TKA, CAD, DVT, LE edema.    Examination-Activity Limitations Other;Bed Mobility;Locomotion Level    Examination-Participation Restrictions Other    Stability/Clinical Decision Making Evolving/Moderate complexity    Clinical Decision Making Low    Rehab Potential Good    PT Frequency 2x / week    PT Duration 6 weeks    PT Treatment/Interventions ADLs/Self Care Home Management;Cryotherapy;Electrical Stimulation;Moist Heat;Ultrasound;Therapeutic exercise;Therapeutic activities;Functional mobility training;Patient/family education;Manual techniques;Dry needling;Passive range of motion    PT Next Visit Plan cont with POC for  U/S, STW/M, E'stim.  Core exercise progression.           Patient will benefit from skilled therapeutic intervention in order to improve the following deficits and impairments:  Pain, Decreased activity tolerance, Decreased range of motion, Increased muscle spasms, Postural dysfunction  Visit  Diagnosis: Acute left-sided low back pain without sciatica  Abnormal posture     Problem List Patient Active Problem List   Diagnosis Date Noted  . Precordial pain 08/30/2018  . Reactive airway disease with acute exacerbation 01/28/2018  . Left ventricular diastolic dysfunction, NYHA class 2 12/21/2013  . Non-rheumatic aortic sclerosis 12/21/2013  . AAA (abdominal aortic aneurysm) ([place]) 12/21/2013  . Abdominal aortic aneurysm ([place]) 12/21/2013  . 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 Permanent atrial fibrillation ([place])   . Edema of both legs   . 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 PVD, moderate bilat carotid disease 05/10/2012  . Dyslipidemia 05/10/2012  . Essential hypertension   . GERD (gastroesophageal reflux disease)   . Bladder cancer ([place])   . Rheumatoid arthritis(714.0)   . Primary bladder malignant neoplasm ([place]) 11/10/2011  . COLONIC POLYPS, ADENOMATOUS 01/26/2008  . HEMORRHOIDS, EXTERNAL 01/26/2008  . GASTRITIS, CHRONIC 01/26/2008  . HIATAL HERNIA 01/26/2008  . DIVERTICULOSIS OF COLON 01/26/2008  . Atrophic gastritis 01/26/2008   Ihor Austin, LPTA/CLT; CBIS [phone] Aldona Lento 05/26/2020, 12:21 PM  [facility] [address] [place], [place], [address] Phone: [phone]   Fax:  [phone]  Name: AARION KITTRELL MRN: [id number] Date of Birth: 1921-06-02

## 2020-05-29 ENCOUNTER — Ambulatory Visit: Payer: PPO | Admitting: Physical Therapy

## 2020-05-29 ENCOUNTER — Other Ambulatory Visit: Payer: Self-pay

## 2020-05-29 ENCOUNTER — Encounter: Payer: Self-pay | Admitting: Physical Therapy

## 2020-05-29 DIAGNOSIS — M545 Low back pain, unspecified: Secondary | ICD-10-CM

## 2020-05-29 DIAGNOSIS — R293 Abnormal posture: Secondary | ICD-10-CM

## 2020-05-29 NOTE — Therapy (Signed)
[facility] [place], [place], [address] Phone: [phone]   Fax:  [phone]  Physical Therapy Treatment  Patient Details  Name: [name] MRN: [id number] Date of Birth: 1921/05/13 Referring Provider (PT): Caryl Pina MD   Encounter Date: 05/29/2020   PT End of Session - 05/29/20 1315    Visit Number 8    Number of Visits 12    Date for PT Re-Evaluation 07/23/20    PT Start Time 7672    PT Stop Time 1358    PT Time Calculation (min) 53 min    Equipment Utilized During Treatment Other (comment)   FWW   Activity Tolerance Patient tolerated treatment well    Behavior During Therapy [facility] for tasks assessed/performed           Past Medical History:  Diagnosis Date  . AAA (abdominal aortic aneurysm) ([place])   . Allergy   . Anxiety   . Bladder cancer ([place]) dx'd 2011   Chronic microscopic hematuria; transitional cell cancer  . Blood transfusion 1960   "related to hysterectomy"  . Chronic atrial fibrillation (HCC)    Anticoagulated with warfarin, rate control with diltiazem and Toprol  . Chronic back pain   . Chronic headache    "probably 2/wk" (08/28/2015)  . Concussion w/o coma 0/94/7096   Complicated by subarachnoid hemorrhage.  "even now has times when she's not able to comprehend" (05/08/12)  . Coronary artery disease    Nonobstructive  . Diverticulitis   . Diverticulosis   . DVT (deep venous thrombosis), right 2005   "right calf after 8 foot fall"  . Edema of both legs    Chronic, thought to be secondary to DVTs  . External hemorrhoids   . Family history of adverse reaction to anesthesia 2012   daughter "had OR for crushed hand; had problems w/anesthesia & I was in there all day long" (  . Fatty liver   . Frequent UTI   . GERD (gastroesophageal reflux disease)   . H/O hiatal hernia   . High cholesterol   . Migraine    "rare now" (08/28/2015)  . Pneumonia ~ 2010;  2013; 07/2015  .  Rheumatoid arthritis(714.0)    "hands" (08/28/2015)  . Urinary frequency     Past Surgical History:  Procedure Laterality Date  . BREAST CYST EXCISION Left 1960's?   2 cysts; benign  . CARDIAC CATHETERIZATION  1980's  . CATARACT EXTRACTION W/ INTRAOCULAR LENS  IMPLANT, BILATERAL Bilateral 1992  . CYSTOSCOPY  11/11/2011   Procedure: CYSTOSCOPY;  Surgeon: Malka So;  Location: [facility];  Service: Urology;  Laterality: N/A;  . CYSTOSTOMY W/ BLADDER BIOPSY  10/08/2013  . INCONTINENCE SURGERY  1980's  . JOINT REPLACEMENT    . KNEE ARTHROSCOPY Right 2005   S/P fall  . Lower Extremity Venous Doppler  11/10/2013   No DVT or superficial thrombus enlarged inguinal lymph node noted in the right. No Baker's cyst.  . TONSILLECTOMY  1952  . TOTAL KNEE ARTHROPLASTY Right 02/05/2013   Procedure: TOTAL KNEE ARTHROPLASTY;  Surgeon: Mauri Pole, MD;  Location: [facility];  Service: Orthopedics;  Laterality: Right;  . TRANSTHORACIC ECHOCARDIOGRAM  01/'15; 4/'19   a) EF 60-65%. NO RWMA. Ao Sclerosis. MAC - no MS with mild MR>  B Atriae mildly dilated;; b)  EF 55-60%. AoV Sclerosis (no stenosis).  Trivial AI & MR. Mild LAE & Severe RAE.. Mild RV dilation  . TRANSURETHRAL RESECTION OF BLADDER TUMOR  11/11/2011   Procedure: TRANSURETHRAL RESECTION OF BLADDER TUMOR (TURBT);  Surgeon: Malka So;  Location: [facility];  Service: Urology;  Laterality: N/A;  Cysto, Bladder Biopsy, TURBT with Gyrus,   . TRANSURETHRAL RESECTION OF BLADDER TUMOR WITH GYRUS (TURBT-GYRUS)  2007; 2009; 2010   "for tumors on surface of bladder"  . TUMOR EXCISION  1976; 1980's   "fatty tumor cut off her upper back; left thumb"  . VAGINAL HYSTERECTOMY  1960   partial     There were no vitals filed for this visit.   Subjective Assessment - 05/29/20 1313    Subjective COVID-19 screen performed prior to patient entering clinic. Patient states that her RLE is very swollen today. reports that she turned this morning and made her LBP worse.     Pertinent History AAA, Right TKA, CAD, DVT, LE edema.    How long can you walk comfortably? Short community distnaces with a rolling walker.    Diagnostic tests X-ray.    Patient Stated Goals Get out of pain.    Currently in Pain? Yes    Pain Score 6     Pain Location Back    Pain Orientation Left;Lower    Pain Descriptors / Indicators Discomfort    Pain Type Acute pain    Pain Onset Today    Pain Frequency Constant              [facility] PT Assessment - 05/29/20 0001      Assessment   Medical Diagnosis Low back pain.    Referring Provider (PT) Vonna Kotyk Dettinger MD      Precautions   Precautions Fall      Restrictions   Weight Bearing Restrictions No                         [facility] Adult PT Treatment/Exercise - 05/29/20 0001      Knee/Hip Exercises: Aerobic   Nustep L4 x15 min      Modalities   Modalities Electrical Stimulation;Moist Heat      Moist Heat Therapy   Number Minutes Moist Heat 15 Minutes    Moist Heat Location Lumbar Spine      Electrical Stimulation   Electrical Stimulation Location Left low back.    Electrical Stimulation Action IFC    Electrical Stimulation Parameters 80-150 hz x15 min    Electrical Stimulation Goals Pain      Manual Therapy   Manual Therapy Soft tissue mobilization    Soft tissue mobilization STW to L lumbar paraspinals, QL and SI joint to reduce pain                       PT Long Term Goals - 05/13/20 1123      PT LONG TERM GOAL #1   Title Independent with a HEP.    Time 6    Period Weeks    Status On-going      PT LONG TERM GOAL #2   Title Sit to stand with pain not > 3/10.    Time 6    Period Weeks    Status On-going   faces 6-8/10 05/13/20     PT LONG TERM GOAL #3   Title Supine to sit with pain not > 3/10.    Time 6    Period Weeks    Status On-going   faces 6-8/10 05/13/20  Plan - 05/29/20 1403    Clinical Impression Statement Patient presented in clinic with  more pain in L low back after twisting at some point during the day. Patient able to complete therex warm up without complaint of pain. BLE TED hose donned to LEs upon arrival although RLE edema noted greatly. Patient tender to manual therapy over L SI joint, lumbar paraspinals. Normal modalities response noted following removal of the modalities.    Personal Factors and Comorbidities Comorbidity 1;Comorbidity 3+;Comorbidity 2    Comorbidities AAA, Right TKA, CAD, DVT, LE edema.    Examination-Activity Limitations Other;Bed Mobility;Locomotion Level    Examination-Participation Restrictions Other    Stability/Clinical Decision Making Evolving/Moderate complexity    Rehab Potential Good    PT Frequency 2x / week    PT Duration 6 weeks    PT Treatment/Interventions ADLs/Self Care Home Management;Cryotherapy;Electrical Stimulation;Moist Heat;Ultrasound;Therapeutic exercise;Therapeutic activities;Functional mobility training;Patient/family education;Manual techniques;Dry needling;Passive range of motion    PT Next Visit Plan cont with POC for  U/S, STW/M, E'stim.  Core exercise progression.    Consulted and Agree with Plan of Care Patient           Patient will benefit from skilled therapeutic intervention in order to improve the following deficits and impairments:  Pain, Decreased activity tolerance, Decreased range of motion, Increased muscle spasms, Postural dysfunction  Visit Diagnosis: Acute left-sided low back pain without sciatica  Abnormal posture     Problem List Patient Active Problem List   Diagnosis Date Noted  . Precordial pain 08/30/2018  . Reactive airway disease with acute exacerbation 01/28/2018  . Left ventricular diastolic dysfunction, NYHA class 2 12/21/2013  . Non-rheumatic aortic sclerosis 12/21/2013  . AAA (abdominal aortic aneurysm) ([place]) 12/21/2013  . Abdominal aortic aneurysm ([place]) 12/21/2013  . 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 Permanent atrial  fibrillation ([place])   . Edema of both legs   . 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 PVD, moderate bilat carotid disease 05/10/2012  . Dyslipidemia 05/10/2012  . Essential hypertension   . GERD (gastroesophageal reflux disease)   . Bladder cancer ([place])   . Rheumatoid arthritis(714.0)   . Primary bladder malignant neoplasm ([place]) 11/10/2011  . COLONIC POLYPS, ADENOMATOUS 01/26/2008  . HEMORRHOIDS, EXTERNAL 01/26/2008  . GASTRITIS, CHRONIC 01/26/2008  . HIATAL HERNIA 01/26/2008  . DIVERTICULOSIS OF COLON 01/26/2008  . Atrophic gastritis 01/26/2008    Standley Brooking, PTA 05/29/2020, 2:06 PM  [facility] [address] [place], [place], [address] Phone: [phone]   Fax:  [phone]  Name: [name] MRN: [id number] Date of Birth: [birth year]

## 2020-06-01 ENCOUNTER — Other Ambulatory Visit: Payer: Self-pay | Admitting: Family Medicine

## 2020-06-02 ENCOUNTER — Ambulatory Visit: Payer: PPO | Admitting: *Deleted

## 2020-06-05 ENCOUNTER — Ambulatory Visit: Payer: PPO | Admitting: Physical Therapy

## 2020-06-05 ENCOUNTER — Other Ambulatory Visit: Payer: Self-pay

## 2020-06-05 DIAGNOSIS — M545 Low back pain, unspecified: Secondary | ICD-10-CM

## 2020-06-05 DIAGNOSIS — R293 Abnormal posture: Secondary | ICD-10-CM

## 2020-06-05 NOTE — Therapy (Signed)
[facility] [place], [place], [address] Phone: [phone]   Fax:  [phone]  Physical Therapy Treatment  Patient Details  Name: [name] MRN: [id number] Date of Birth: [birth year] Referring Provider (PT): Caryl Pina MD   Encounter Date: 06/05/2020   PT End of Session - 06/05/20 1329    Visit Number 9    Number of Visits 12    Date for PT Re-Evaluation 07/23/20    PT Start Time 0120    PT Stop Time 0210    PT Time Calculation (min) 50 min    Activity Tolerance Patient tolerated treatment well    Behavior During Therapy [facility] for tasks assessed/performed           Past Medical History:  Diagnosis Date  . AAA (abdominal aortic aneurysm) ([place])   . Allergy   . Anxiety   . Bladder cancer ([place]) dx'd 2011   Chronic microscopic hematuria; transitional cell cancer  . Blood transfusion 1960   "related to hysterectomy"  . Chronic atrial fibrillation (HCC)    Anticoagulated with warfarin, rate control with diltiazem and Toprol  . Chronic back pain   . Chronic headache    "probably 2/wk" (08/28/2015)  . Concussion w/o coma 3/56/8616   Complicated by subarachnoid hemorrhage.  "even now has times when she's not able to comprehend" (05/08/12)  . Coronary artery disease    Nonobstructive  . Diverticulitis   . Diverticulosis   . DVT (deep venous thrombosis), right 2005   "right calf after 8 foot fall"  . Edema of both legs    Chronic, thought to be secondary to DVTs  . External hemorrhoids   . Family history of adverse reaction to anesthesia 2012   daughter "had OR for crushed hand; had problems w/anesthesia & I was in there all day long" (  . Fatty liver   . Frequent UTI   . GERD (gastroesophageal reflux disease)   . H/O hiatal hernia   . High cholesterol   . Migraine    "rare now" (08/28/2015)  . Pneumonia ~ 2010;  2013; 07/2015  . Rheumatoid arthritis(714.0)    "hands" (08/28/2015)  . Urinary  frequency     Past Surgical History:  Procedure Laterality Date  . BREAST CYST EXCISION Left 1960's?   2 cysts; benign  . CARDIAC CATHETERIZATION  1980's  . CATARACT EXTRACTION W/ INTRAOCULAR LENS  IMPLANT, BILATERAL Bilateral 1992  . CYSTOSCOPY  11/11/2011   Procedure: CYSTOSCOPY;  Surgeon: Malka So;  Location: [facility];  Service: Urology;  Laterality: N/A;  . CYSTOSTOMY W/ BLADDER BIOPSY  10/08/2013  . INCONTINENCE SURGERY  1980's  . JOINT REPLACEMENT    . KNEE ARTHROSCOPY Right 2005   S/P fall  . Lower Extremity Venous Doppler  11/10/2013   No DVT or superficial thrombus enlarged inguinal lymph node noted in the right. No Baker's cyst.  . TONSILLECTOMY  1952  . TOTAL KNEE ARTHROPLASTY Right 02/05/2013   Procedure: TOTAL KNEE ARTHROPLASTY;  Surgeon: Mauri Pole, MD;  Location: [facility];  Service: Orthopedics;  Laterality: Right;  . TRANSTHORACIC ECHOCARDIOGRAM  01/'15; 4/'19   a) EF 60-65%. NO RWMA. Ao Sclerosis. MAC - no MS with mild MR>  B Atriae mildly dilated;; b)  EF 55-60%. AoV Sclerosis (no stenosis).  Trivial AI & MR. Mild LAE & Severe RAE.. Mild RV dilation  . TRANSURETHRAL RESECTION OF BLADDER TUMOR  11/11/2011   Procedure: TRANSURETHRAL RESECTION OF BLADDER TUMOR (TURBT);  Surgeon: Malka So;  Location: [facility];  Service: Urology;  Laterality: N/A;  Cysto, Bladder Biopsy, TURBT with Gyrus,   . TRANSURETHRAL RESECTION OF BLADDER TUMOR WITH GYRUS (TURBT-GYRUS)  2007; 2009; 2010   "for tumors on surface of bladder"  . TUMOR EXCISION  1976; 1980's   "fatty tumor cut off her upper back; left thumb"  . VAGINAL HYSTERECTOMY  1960   partial     There were no vitals filed for this visit.   Subjective Assessment - 06/05/20 1319    Subjective COVID-19 screen performed prior to patient entering clinic. Patient arrived with less pain today per reported    Pertinent History AAA, Right TKA, CAD, DVT, LE edema.    How long can you walk comfortably? Short community distnaces  with a rolling walker.    Diagnostic tests X-ray.    Patient Stated Goals Get out of pain.    Pain Score 4     Pain Location Back    Pain Orientation Lower;Left    Pain Descriptors / Indicators Discomfort    Pain Type Acute pain    Pain Onset Today    Pain Frequency Constant    Aggravating Factors  increased standing activity    Pain Relieving Factors at rest                             [facility] Adult PT Treatment/Exercise - 06/05/20 0001      Bed Mobility   Bed Mobility Sit to Supine;Left Sidelying to Sit;Right Sidelying to Sit      Exercises   Exercises Lumbar      Lumbar Exercises: Standing   Heel Raises 10 reps      Lumbar Exercises: Seated   Sit to Stand 5 reps   for technique   Other Seated Lumbar Exercises seated for core activation with glut squeeze 5sec x10, heel lift 2x10, marching with left LE only x10      Lumbar Exercises: Supine   Ab Set 10 reps;3 seconds    Glut Set 3 seconds;10 reps    Bent Knee Raise 5 reps;3 seconds   only left LE due to pain on right knee     Knee/Hip Exercises: Aerobic   Nustep L3 x3mn UE/LE activity      Moist Heat Therapy   Number Minutes Moist Heat 15 Minutes    Moist Heat Location Lumbar Spine      Electrical Stimulation   Electrical Stimulation Location Left low back.    Electrical Stimulation Action IFC    Electrical Stimulation Parameters 80-_0  x149m    Electrical Stimulation Goals Pain                  PT Education - 06/05/20 1343    Education Details HEP progression    Person(s) Educated Patient    Methods Explanation;Demonstration;Handout    Comprehension Verbalized understanding;Returned demonstration               PT Long Term Goals - 06/05/20 1331      PT LONG TERM GOAL #1   Title Independent with a HEP.    Baseline met 06/05/20 issued today    Time 6    Period Weeks    Status Achieved      PT LONG TERM GOAL #2   Title Sit to stand with pain not > 3/10.    Time 6     Period Weeks  Status On-going   4/10 with movements 06/05/20     PT LONG TERM GOAL #3   Title Supine to sit with pain not > 3/10.    Time 6    Period Weeks    Status On-going   4/10 per reported with transition 06/05/20                Plan - 06/05/20 1403    Clinical Impression Statement Patient tolerated treatment well today. Today focused on gentle exercises and core progression with HEP provided. Educated patient on modified exercises due to her ongoing knee pain, and to only perform a few a day. Also reviewed transition movements such as logroll, sit to stands and bening with proper technique to avoid re-injury/pain. Patient met LTG #1 today with remaining goals progressing with less pain overall.    Personal Factors and Comorbidities Comorbidity 1;Comorbidity 3+;Comorbidity 2    Comorbidities AAA, Right TKA, CAD, DVT, LE edema.    Examination-Activity Limitations Other;Bed Mobility;Locomotion Level    Examination-Participation Restrictions Other    Stability/Clinical Decision Making Evolving/Moderate complexity    Rehab Potential Good    PT Frequency 2x / week    PT Duration 6 weeks    PT Treatment/Interventions ADLs/Self Care Home Management;Cryotherapy;Electrical Stimulation;Moist Heat;Ultrasound;Therapeutic exercise;Therapeutic activities;Functional mobility training;Patient/family education;Manual techniques;Dry needling;Passive range of motion    PT Next Visit Plan cont with POC for  U/S, STW/M, E'stim.  Core exercise progression.    Consulted and Agree with Plan of Care Patient           Patient will benefit from skilled therapeutic intervention in order to improve the following deficits and impairments:  Pain, Decreased activity tolerance, Decreased range of motion, Increased muscle spasms, Postural dysfunction  Visit Diagnosis: Acute left-sided low back pain without sciatica  Abnormal posture     Problem List Patient Active Problem List   Diagnosis Date  Noted  . Precordial pain 08/30/2018  . Reactive airway disease with acute exacerbation 01/28/2018  . Left ventricular diastolic dysfunction, NYHA class 2 12/21/2013  . Non-rheumatic aortic sclerosis 12/21/2013  . AAA (abdominal aortic aneurysm) ([place]) 12/21/2013  . Abdominal aortic aneurysm ([place]) 12/21/2013  . 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 Permanent atrial fibrillation ([place])   . Edema of both legs   . 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 PVD, moderate bilat carotid disease 05/10/2012  . Dyslipidemia 05/10/2012  . Essential hypertension   . GERD (gastroesophageal reflux disease)   . Bladder cancer ([place])   . Rheumatoid arthritis(714.0)   . Primary bladder malignant neoplasm ([place]) 11/10/2011  . COLONIC POLYPS, ADENOMATOUS 01/26/2008  . HEMORRHOIDS, EXTERNAL 01/26/2008  . GASTRITIS, CHRONIC 01/26/2008  . HIATAL HERNIA 01/26/2008  . DIVERTICULOSIS OF COLON 01/26/2008  . Atrophic gastritis 01/26/2008    ,  P, PTA 06/05/2020, 2:14 PM  [facility] [place], [place], [address] Phone: [phone]   Fax:  [phone]  Name: BRIAR SWORD MRN: [id number] Date of Birth: [birth year]

## 2020-06-05 NOTE — Patient Instructions (Signed)
Pelvic Tilt: Posterior - Legs Bent (Supine)  Tighten stomach and buttock and flatten back by rolling pelvis down. Hold _10___ seconds. Relax. Repeat _10-30___ times per set. Do __2__ sets per session. Do _2___ sessions per day.   Bent Leg Lift (Hook-Lying)  Tighten stomach and buttock and slowly raise right leg _5___ inches from floor. Keep trunk rigid. Hold _3___ seconds. Repeat _10___ times per set. Do ___2-3_ sets per session. Do __2__ sessions per day.   Heel Raise (Sitting)    Raise heels, keeping toes on floor.  Tighten abdomin and buttock (draw in) Repeat __10__ times per set. Do __1-2__ sets per session. Do __1-2__ sessions per day.   FLEXION: Sitting (Active)    Sit, both feet flat. Lift right knee toward ceiling. Tighten abdomin and buttock (draw in) Complete _10__ sets of _1-2__ repetitions. Perform _1-2__ sessions per day.   Lower Body: Toe Rise   Standing, place feet apart. Hold arms out for balance or use support. Rise up on toes. Hold _3___ seconds, then lower. Repeat immediately. Repeat __10__ times. Do __2__ sessions per day.   Half Squat to Chair   Stand with feet shoulder width apart. Push buttocks backward and lower slowly, sitting in chair lightly and returning to standing position. Complete _2_ sets of 10_ repetitions. Perform __2-3_ sessions per day.

## 2020-06-06 ENCOUNTER — Other Ambulatory Visit: Payer: Self-pay | Admitting: Family Medicine

## 2020-06-09 ENCOUNTER — Other Ambulatory Visit: Payer: Self-pay

## 2020-06-09 ENCOUNTER — Encounter: Payer: Self-pay | Admitting: Physical Therapy

## 2020-06-09 ENCOUNTER — Ambulatory Visit: Payer: PPO | Admitting: Physical Therapy

## 2020-06-09 DIAGNOSIS — M545 Low back pain, unspecified: Secondary | ICD-10-CM

## 2020-06-09 DIAGNOSIS — G8929 Other chronic pain: Secondary | ICD-10-CM

## 2020-06-09 DIAGNOSIS — R293 Abnormal posture: Secondary | ICD-10-CM

## 2020-06-09 DIAGNOSIS — M25561 Pain in right knee: Secondary | ICD-10-CM

## 2020-06-09 DIAGNOSIS — M6281 Muscle weakness (generalized): Secondary | ICD-10-CM

## 2020-06-09 NOTE — Therapy (Signed)
[facility] [place], [place], [address] Phone: [phone]   Fax:  [phone]  Physical Therapy Treatment  Patient Details  Name: [name] MRN: [id number] Date of Birth: 12-22-1920 Referring Provider (PT): Caryl Pina MD   Encounter Date: 06/09/2020   PT End of Session - 06/09/20 1937    Visit Number 10    Number of Visits 12    Date for PT Re-Evaluation 07/23/20    PT Start Time 1115    PT Stop Time 1200    PT Time Calculation (min) 45 min    Equipment Utilized During Treatment Other (comment)    Activity Tolerance Patient tolerated treatment well    Behavior During Therapy [facility] for tasks assessed/performed           Past Medical History:  Diagnosis Date  . AAA (abdominal aortic aneurysm) ([place])   . Allergy   . Anxiety   . Bladder cancer ([place]) dx'd 2011   Chronic microscopic hematuria; transitional cell cancer  . Blood transfusion 1960   "related to hysterectomy"  . Chronic atrial fibrillation (HCC)    Anticoagulated with warfarin, rate control with diltiazem and Toprol  . Chronic back pain   . Chronic headache    "probably 2/wk" (08/28/2015)  . Concussion w/o coma 08/14/4096   Complicated by subarachnoid hemorrhage.  "even now has times when she's not able to comprehend" (05/08/12)  . Coronary artery disease    Nonobstructive  . Diverticulitis   . Diverticulosis   . DVT (deep venous thrombosis), right 2005   "right calf after 8 foot fall"  . Edema of both legs    Chronic, thought to be secondary to DVTs  . External hemorrhoids   . Family history of adverse reaction to anesthesia 2012   daughter "had OR for crushed hand; had problems w/anesthesia & I was in there all day long" (  . Fatty liver   . Frequent UTI   . GERD (gastroesophageal reflux disease)   . H/O hiatal hernia   . High cholesterol   . Migraine    "rare now" (08/28/2015)  . Pneumonia ~ 2010;  2013; 07/2015  . Rheumatoid  arthritis(714.0)    "hands" (08/28/2015)  . Urinary frequency     Past Surgical History:  Procedure Laterality Date  . BREAST CYST EXCISION Left 1960's?   2 cysts; benign  . CARDIAC CATHETERIZATION  1980's  . CATARACT EXTRACTION W/ INTRAOCULAR LENS  IMPLANT, BILATERAL Bilateral 1992  . CYSTOSCOPY  11/11/2011   Procedure: CYSTOSCOPY;  Surgeon: Malka So;  [facility];  Service: Urology;  Laterality: N/A;  . CYSTOSTOMY W/ BLADDER BIOPSY  10/08/2013  . INCONTINENCE SURGERY  1980's  . JOINT REPLACEMENT    . KNEE ARTHROSCOPY Right 2005   S/P fall  . Lower Extremity Venous Doppler  11/10/2013   No DVT or superficial thrombus enlarged inguinal lymph node noted in the right. No Baker's cyst.  . TONSILLECTOMY  1952  . TOTAL KNEE ARTHROPLASTY Right 02/05/2013   Procedure: TOTAL KNEE ARTHROPLASTY;  Surgeon: Mauri Pole, MD;  [facility];  Service: Orthopedics;  Laterality: Right;  . TRANSTHORACIC ECHOCARDIOGRAM  01/'15; 4/'19   a) EF 60-65%. NO RWMA. Ao Sclerosis. MAC - no MS with mild MR>  B Atriae mildly dilated;; b)  EF 55-60%. AoV Sclerosis (no stenosis).  Trivial AI & MR. Mild LAE & Severe RAE.. Mild RV dilation  . TRANSURETHRAL RESECTION OF BLADDER TUMOR  11/11/2011  Procedure: TRANSURETHRAL RESECTION OF BLADDER TUMOR (TURBT);  Surgeon: Malka So;  [facility];  Service: Urology;  Laterality: N/A;  Cysto, Bladder Biopsy, TURBT with Gyrus,   . TRANSURETHRAL RESECTION OF BLADDER TUMOR WITH GYRUS (TURBT-GYRUS)  2007; 2009; 2010   "for tumors on surface of bladder"  . TUMOR EXCISION  1976; 1980's   "fatty tumor cut off her upper back; left thumb"  . VAGINAL HYSTERECTOMY  1960   partial     There were no vitals filed for this visit.   Subjective Assessment - 06/09/20 1128    Subjective COVID-19 screen performed prior to patient entering clinic. Patient arrived today reproting, "my back is getting better, but my knee is bothering me" Pt reproting 7/10 pain in R knee  and reports she can't walk without her walker due to her knee pain.    Pertinent History AAA, Right TKA, CAD, DVT, LE edema.    How long can you walk comfortably? Short community distnaces with a rolling walker.    Diagnostic tests X-ray.    Patient Stated Goals Get out of pain.    Currently in Pain? Yes    Pain Score 7     Pain Location Knee    Pain Orientation Right    Pain Descriptors / Indicators Aching;Sore    Pain Type Chronic pain    Pain Onset More than a month ago    Pain Frequency Constant    Multiple Pain Sites Yes    Pain Score 3    Pain Location Back    Pain Orientation Lower;Left    Pain Type Acute pain    Pain Onset More than a month ago    Pain Frequency Constant    Aggravating Factors  standing long periods, cooking    Pain Relieving Factors sitting                             [facility] Adult PT Treatment/Exercise - 06/09/20 0001      Bed Mobility   Bed Mobility Supine to Sit;Sit to Supine    Supine to Sit Minimal Assistance - Patient > 75%   lifting R LE   Sit to Supine Minimal Assistance - Patient > 75%   lifting R LE     Exercises   Exercises Lumbar      Lumbar Exercises: Stretches   Active Hamstring Stretch 2 reps;20 seconds      Lumbar Exercises: Standing   Heel Raises 10 reps    Other Standing Lumbar Exercises marcing x 20 UE support    Other Standing Lumbar Exercises hamstring curls x 15 each LE alternating with UE support      Lumbar Exercises: Seated   Long Arc Quad on Chair AROM;Strengthening;Both;10 reps    Sit to Stand 5 reps   for technique   Other Seated Lumbar Exercises seated marching x 10     Other Seated Lumbar Exercises glute sets x 10 holding 5 seconds each      Lumbar Exercises: Supine   Bent Knee Raise 10 reps;3 seconds   L LE only, pain in R LE   Bridge 5 reps;2 seconds    Other Supine Lumbar Exercises clam shells x 10 reps       Knee/Hip Exercises: Aerobic   Nustep L1 x 10 minutes UE/LE    pt stated Level 2  was too difficult today     Modalities   Modalities Electrical  Stimulation;Moist Heat      Moist Heat Therapy   Number Minutes Moist Heat 10 Minutes    Moist Heat Location Lumbar Spine      Electrical Stimulation   Electrical Stimulation Location bilateral lumbar paraspinals    Electrical Stimulation Action IFC    Electrical Stimulation Parameters 80-150 Hz x 10 minutes, intensity to tolerance    Electrical Stimulation Goals Pain                       PT Long Term Goals - 06/05/20 1331      PT LONG TERM GOAL #1   Title Independent with a HEP.    Baseline met 06/05/20 issued today    Time 6    Period Weeks    Status Achieved      PT LONG TERM GOAL #2   Title Sit to stand with pain not > 3/10.    Time 6    Period Weeks    Status On-going   4/10 with movements 06/05/20     PT LONG TERM GOAL #3   Title Supine to sit with pain not > 3/10.    Time 6    Period Weeks    Status On-going   4/10 per reported with transition 06/05/20                Plan - 06/09/20 1153    Clinical Impression Statement Pt tolerating treatment well. Pt reporting more pain in R LE than her low back. Exercises focused on lumbar and LE stretching and strengthening. Pt still requiring assistance with R LE with bed mobility. Pt with increased swelling noted in R LE with pitting edema of +2 in R ankle. Continue to progress as pt tolerates toward goals set. No goals met this session.    Personal Factors and Comorbidities Comorbidity 1;Comorbidity 3+;Comorbidity 2    Comorbidities AAA, Right TKA, CAD, DVT, LE edema.    Examination-Activity Limitations Other;Bed Mobility;Locomotion Level    Examination-Participation Restrictions Other    Stability/Clinical Decision Making Evolving/Moderate complexity    Rehab Potential Good    PT Frequency 2x / week    PT Duration 6 weeks    PT Treatment/Interventions ADLs/Self Care Home Management;Cryotherapy;Electrical Stimulation;Moist  Heat;Ultrasound;Therapeutic exercise;Therapeutic activities;Functional mobility training;Patient/family education;Manual techniques;Dry needling;Passive range of motion    PT Next Visit Plan Core exercise progression, LE strenthening, lumbar stretching, hip flexor stretching, hamstring stretching    Consulted and Agree with Plan of Care Patient           Patient will benefit from skilled therapeutic intervention in order to improve the following deficits and impairments:  Pain, Decreased activity tolerance, Decreased range of motion, Increased muscle spasms, Postural dysfunction  Visit Diagnosis: Acute left-sided low back pain without sciatica  Abnormal posture  Muscle weakness (generalized)  Chronic pain of right knee     Problem List Patient Active Problem List   Diagnosis Date Noted  . Precordial pain 08/30/2018  . Reactive airway disease with acute exacerbation 01/28/2018  . Left ventricular diastolic dysfunction, NYHA class 2 12/21/2013  . Non-rheumatic aortic sclerosis 12/21/2013  . AAA (abdominal aortic aneurysm) ([place]) 12/21/2013  . Abdominal aortic aneurysm ([place]) 12/21/2013  . 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 Permanent atrial fibrillation ([place])   . Edema of both legs   . 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 PVD, moderate bilat carotid disease 05/10/2012  . Dyslipidemia 05/10/2012  . Essential hypertension   . GERD (gastroesophageal reflux disease)   .  Bladder cancer ([place])   . Rheumatoid arthritis(714.0)   . Primary bladder malignant neoplasm ([place]) 11/10/2011  . COLONIC POLYPS, ADENOMATOUS 01/26/2008  . HEMORRHOIDS, EXTERNAL 01/26/2008  . GASTRITIS, CHRONIC 01/26/2008  . HIATAL HERNIA 01/26/2008  . DIVERTICULOSIS OF COLON 01/26/2008  . Atrophic gastritis 01/26/2008    Oretha Caprice, PT, MPT 06/09/2020, 11:56 AM  [facility] [address] [place], [place], [address] Phone: [phone]   Fax:   [phone]  Name: TOSHIE DEMELO MRN: [id number] Date of Birth: [birth year]

## 2020-06-09 NOTE — Telephone Encounter (Signed)
Received refill request from pharmacy-Diltiazem is on her med list but I don't see that we rx'd it. Ok to fill?

## 2020-06-11 ENCOUNTER — Other Ambulatory Visit: Payer: Self-pay

## 2020-06-11 ENCOUNTER — Ambulatory Visit: Payer: PPO | Admitting: Physical Therapy

## 2020-06-11 DIAGNOSIS — R293 Abnormal posture: Secondary | ICD-10-CM

## 2020-06-11 DIAGNOSIS — M545 Low back pain, unspecified: Secondary | ICD-10-CM

## 2020-06-11 NOTE — Therapy (Signed)
[facility] [place], [place], [address] Phone: [phone]   Fax:  [phone]  Physical Therapy Treatment  Patient Details  Name: [name] MRN: [id number] Date of Birth: 28-Jan-1921 Referring Provider (PT): Caryl Pina MD   Encounter Date: 06/11/2020   PT End of Session - 06/11/20 1130    Visit Number 11    Number of Visits 12    Date for PT Re-Evaluation 07/23/20    PT Start Time 1115    PT Stop Time 1158    PT Time Calculation (min) 43 min           Past Medical History:  Diagnosis Date  . AAA (abdominal aortic aneurysm) ([place])   . Allergy   . Anxiety   . Bladder cancer ([place]) dx'd 2011   Chronic microscopic hematuria; transitional cell cancer  . Blood transfusion 1960   "related to hysterectomy"  . Chronic atrial fibrillation (HCC)    Anticoagulated with warfarin, rate control with diltiazem and Toprol  . Chronic back pain   . Chronic headache    "probably 2/wk" (08/28/2015)  . Concussion w/o coma 1/66/0630   Complicated by subarachnoid hemorrhage.  "even now has times when she's not able to comprehend" (05/08/12)  . Coronary artery disease    Nonobstructive  . Diverticulitis   . Diverticulosis   . DVT (deep venous thrombosis), right 2005   "right calf after 8 foot fall"  . Edema of both legs    Chronic, thought to be secondary to DVTs  . External hemorrhoids   . Family history of adverse reaction to anesthesia 2012   daughter "had OR for crushed hand; had problems w/anesthesia & I was in there all day long" (  . Fatty liver   . Frequent UTI   . GERD (gastroesophageal reflux disease)   . H/O hiatal hernia   . High cholesterol   . Migraine    "rare now" (08/28/2015)  . Pneumonia ~ 2010;  2013; 07/2015  . Rheumatoid arthritis(714.0)    "hands" (08/28/2015)  . Urinary frequency     Past Surgical History:  Procedure Laterality Date  . BREAST CYST EXCISION Left 1960's?   2 cysts;  benign  . CARDIAC CATHETERIZATION  1980's  . CATARACT EXTRACTION W/ INTRAOCULAR LENS  IMPLANT, BILATERAL Bilateral 1992  . CYSTOSCOPY  11/11/2011   Procedure: CYSTOSCOPY;  Surgeon: Malka So;  Location: [facility];  Service: Urology;  Laterality: N/A;  . CYSTOSTOMY W/ BLADDER BIOPSY  10/08/2013  . INCONTINENCE SURGERY  1980's  . JOINT REPLACEMENT    . KNEE ARTHROSCOPY Right 2005   S/P fall  . Lower Extremity Venous Doppler  11/10/2013   No DVT or superficial thrombus enlarged inguinal lymph node noted in the right. No Baker's cyst.  . TONSILLECTOMY  1952  . TOTAL KNEE ARTHROPLASTY Right 02/05/2013   Procedure: TOTAL KNEE ARTHROPLASTY;  Surgeon: Mauri Pole, MD;  Location: [facility];  Service: Orthopedics;  Laterality: Right;  . TRANSTHORACIC ECHOCARDIOGRAM  01/'15; 4/'19   a) EF 60-65%. NO RWMA. Ao Sclerosis. MAC - no MS with mild MR>  B Atriae mildly dilated;; b)  EF 55-60%. AoV Sclerosis (no stenosis).  Trivial AI & MR. Mild LAE & Severe RAE.. Mild RV dilation  . TRANSURETHRAL RESECTION OF BLADDER TUMOR  11/11/2011   Procedure: TRANSURETHRAL RESECTION OF BLADDER TUMOR (TURBT);  Surgeon: Malka So;  Location: [facility];  Service: Urology;  Laterality: N/A;  Cysto, Bladder Biopsy,  TURBT with Gyrus,   . TRANSURETHRAL RESECTION OF BLADDER TUMOR WITH GYRUS (TURBT-GYRUS)  2007; 2009; 2010   "for tumors on surface of bladder"  . TUMOR EXCISION  1976; 1980's   "fatty tumor cut off her upper back; left thumb"  . VAGINAL HYSTERECTOMY  1960   partial     There were no vitals filed for this visit.   Subjective Assessment - 06/11/20 1119    Subjective COVID-19 screen performed prior to patient entering clinic. Patient reported more knee pain than back today    Pertinent History AAA, Right TKA, CAD, DVT, LE edema.    How long can you walk comfortably? Short community distnaces with a rolling walker.    Diagnostic tests X-ray.    Patient Stated Goals Get out of pain.    Currently in Pain? Yes     Pain Score 3     Pain Location Back   7/10 in right knee   Pain Orientation Lower    Pain Descriptors / Indicators Discomfort    Pain Type Chronic pain    Pain Onset More than a month ago    Pain Frequency Constant    Aggravating Factors  prolong activity or certain movements    Pain Relieving Factors rest                             [facility] Adult PT Treatment/Exercise - 06/11/20 0001      Lumbar Exercises: Standing   Heel Raises 15 reps    Heel Raises Limitations toe raise x15    Other Standing Lumbar Exercises marcing x 20 UE support      Lumbar Exercises: Seated   Sit to Stand 5 reps    Other Seated Lumbar Exercises seated marching 2x10 with core activation, then red band for hip abd x20 with core activation    Other Seated Lumbar Exercises glute sets x 10 holding 5 seconds each      Knee/Hip Exercises: Aerobic   Nustep 44mn L1 UE/LE activity      Moist Heat Therapy   Number Minutes Moist Heat 15 Minutes    Moist Heat Location Lumbar Spine      Electrical Stimulation   Electrical Stimulation Location bilateral lumbar paraspinals    Electrical Stimulation Action IFC    Electrical Stimulation Parameters 80-_0  x158m    Electrical Stimulation Goals Pain                       PT Long Term Goals - 06/11/20 1132      PT LONG TERM GOAL #1   Title Independent with a HEP.    Baseline met 06/05/20 issued today    Time 6    Period Weeks    Status Achieved      PT LONG TERM GOAL #2   Title Sit to stand with pain not > 3/10.    Baseline Met 06/11/20 no pain in back    Time 6    Period Weeks    Status Achieved      PT LONG TERM GOAL #3   Title Supine to sit with pain not > 3/10.    Baseline Met 3/10 per reported    Time 6    Period Weeks    Status Achieved                 Plan - 06/11/20 1146    Clinical Impression  Statement Patient tolerated treatment well today. Patient able to complete all exericses with modifications due  to knee pain. Patient reported overall progress and pain in low back is no more than 3/10. Patient has limiations due to right knee pain. Today patient has met all goals. Patient was educated on HEP and walking program and will review next treatment for DC.    Personal Factors and Comorbidities Comorbidity 1;Comorbidity 3+;Comorbidity 2    Comorbidities AAA, Right TKA, CAD, DVT, LE edema.    Examination-Activity Limitations Other;Bed Mobility;Locomotion Level    Examination-Participation Restrictions Other    Rehab Potential Good    PT Frequency 2x / week    PT Duration 6 weeks    PT Treatment/Interventions ADLs/Self Care Home Management;Cryotherapy;Electrical Stimulation;Moist Heat;Ultrasound;Therapeutic exercise;Therapeutic activities;Functional mobility training;Patient/family education;Manual techniques;Dry needling;Passive range of motion    PT Next Visit Plan review HEP and DC next treatment    Consulted and Agree with Plan of Care Patient           Patient will benefit from skilled therapeutic intervention in order to improve the following deficits and impairments:  Pain, Decreased activity tolerance, Decreased range of motion, Increased muscle spasms, Postural dysfunction  Visit Diagnosis: Acute left-sided low back pain without sciatica  Abnormal posture     Problem List Patient Active Problem List   Diagnosis Date Noted  . Precordial pain 08/30/2018  . Reactive airway disease with acute exacerbation 01/28/2018  . Left ventricular diastolic dysfunction, NYHA class 2 12/21/2013  . Non-rheumatic aortic sclerosis 12/21/2013  . AAA (abdominal aortic aneurysm) ([place]) 12/21/2013  . Abdominal aortic aneurysm ([place]) 12/21/2013  . 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 Permanent atrial fibrillation ([place])   . Edema of both legs   . 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 PVD, moderate bilat carotid disease 05/10/2012  . Dyslipidemia 05/10/2012  . Essential hypertension   . GERD  (gastroesophageal reflux disease)   . Bladder cancer ([place])   . Rheumatoid arthritis(714.0)   . Primary bladder malignant neoplasm ([place]) 11/10/2011  . COLONIC POLYPS, ADENOMATOUS 01/26/2008  . HEMORRHOIDS, EXTERNAL 01/26/2008  . GASTRITIS, CHRONIC 01/26/2008  . HIATAL HERNIA 01/26/2008  . DIVERTICULOSIS OF COLON 01/26/2008  . Atrophic gastritis 01/26/2008    ,  P, PTA 06/11/2020, 12:01 PM  [facility] [address] [place], [place], [address] Phone: [phone]   Fax:  [phone]  Name: PEIGHTON MEHRA MRN: [id number] Date of Birth: [birth year]

## 2020-06-19 ENCOUNTER — Ambulatory Visit: Payer: PPO | Admitting: Physical Therapy

## 2020-06-25 ENCOUNTER — Encounter: Payer: Self-pay | Admitting: Physical Therapy

## 2020-06-25 ENCOUNTER — Other Ambulatory Visit: Payer: Self-pay

## 2020-06-25 ENCOUNTER — Ambulatory Visit: Payer: PPO | Attending: Family Medicine | Admitting: Physical Therapy

## 2020-06-25 DIAGNOSIS — M6281 Muscle weakness (generalized): Secondary | ICD-10-CM | POA: Diagnosis not present

## 2020-06-25 DIAGNOSIS — R293 Abnormal posture: Secondary | ICD-10-CM | POA: Insufficient documentation

## 2020-06-25 DIAGNOSIS — M25561 Pain in right knee: Secondary | ICD-10-CM | POA: Insufficient documentation

## 2020-06-25 DIAGNOSIS — M545 Low back pain, unspecified: Secondary | ICD-10-CM

## 2020-06-25 DIAGNOSIS — G8929 Other chronic pain: Secondary | ICD-10-CM | POA: Insufficient documentation

## 2020-06-25 NOTE — Therapy (Signed)
[facility] [place], [place], [address] Phone: [phone]   Fax:  [phone]  Physical Therapy Treatment Discharge   Patient Details  Name: [name] MRN: [id number] Date of Birth: 1921/08/15 Referring Provider (PT): Caryl Pina MD   Encounter Date: 06/25/2020   PT End of Session - 06/25/20 1134    Visit Number 12    Number of Visits 12    Date for PT Re-Evaluation 07/23/20    PT Start Time 1115    PT Stop Time 1146    PT Time Calculation (min) 31 min    Activity Tolerance Patient tolerated treatment well    Behavior During Therapy [facility] for tasks assessed/performed           Past Medical History:  Diagnosis Date  . AAA (abdominal aortic aneurysm) ([place])   . Allergy   . Anxiety   . Bladder cancer ([place]) dx'd 2011   Chronic microscopic hematuria; transitional cell cancer  . Blood transfusion 1960   "related to hysterectomy"  . Chronic atrial fibrillation (HCC)    Anticoagulated with warfarin, rate control with diltiazem and Toprol  . Chronic back pain   . Chronic headache    "probably 2/wk" (08/28/2015)  . Concussion w/o coma 5/44/9201   Complicated by subarachnoid hemorrhage.  "even now has times when she's not able to comprehend" (05/08/12)  . Coronary artery disease    Nonobstructive  . Diverticulitis   . Diverticulosis   . DVT (deep venous thrombosis), right 2005   "right calf after 8 foot fall"  . Edema of both legs    Chronic, thought to be secondary to DVTs  . External hemorrhoids   . Family history of adverse reaction to anesthesia 2012   daughter "had OR for crushed hand; had problems w/anesthesia & I was in there all day long" (  . Fatty liver   . Frequent UTI   . GERD (gastroesophageal reflux disease)   . H/O hiatal hernia   . High cholesterol   . Migraine    "rare now" (08/28/2015)  . Pneumonia ~ 2010;  2013; 07/2015  . Rheumatoid arthritis(714.0)    "hands" (08/28/2015)  .  Urinary frequency     Past Surgical History:  Procedure Laterality Date  . BREAST CYST EXCISION Left 1960's?   2 cysts; benign  . CARDIAC CATHETERIZATION  1980's  . CATARACT EXTRACTION W/ INTRAOCULAR LENS  IMPLANT, BILATERAL Bilateral 1992  . CYSTOSCOPY  11/11/2011   Procedure: CYSTOSCOPY;  Surgeon: Malka So;  [facility];  Service: Urology;  Laterality: N/A;  . CYSTOSTOMY W/ BLADDER BIOPSY  10/08/2013  . INCONTINENCE SURGERY  1980's  . JOINT REPLACEMENT    . KNEE ARTHROSCOPY Right 2005   S/P fall  . Lower Extremity Venous Doppler  11/10/2013   No DVT or superficial thrombus enlarged inguinal lymph node noted in the right. No Baker's cyst.  . TONSILLECTOMY  1952  . TOTAL KNEE ARTHROPLASTY Right 02/05/2013   Procedure: TOTAL KNEE ARTHROPLASTY;  Surgeon: Mauri Pole, MD;  Location: [facility];  Service: Orthopedics;  Laterality: Right;  . TRANSTHORACIC ECHOCARDIOGRAM  01/'15; 4/'19   a) EF 60-65%. NO RWMA. Ao Sclerosis. MAC - no MS with mild MR>  B Atriae mildly dilated;; b)  EF 55-60%. AoV Sclerosis (no stenosis).  Trivial AI & MR. Mild LAE & Severe RAE.. Mild RV dilation  . TRANSURETHRAL RESECTION OF BLADDER TUMOR  11/11/2011   Procedure: TRANSURETHRAL RESECTION OF BLADDER  TUMOR (TURBT);  Surgeon: Malka So;  Location: [facility];  Service: Urology;  Laterality: N/A;  Cysto, Bladder Biopsy, TURBT with Gyrus,   . TRANSURETHRAL RESECTION OF BLADDER TUMOR WITH GYRUS (TURBT-GYRUS)  2007; 2009; 2010   "for tumors on surface of bladder"  . TUMOR EXCISION  1976; 1980's   "fatty tumor cut off her upper back; left thumb"  . VAGINAL HYSTERECTOMY  1960   partial     There were no vitals filed for this visit.   Subjective Assessment - 06/25/20 1132    Subjective COVID-19 screen performed prior to patient entering clinic. Patient reported more knee pain than my back.    Pertinent History AAA, Right TKA, CAD, DVT, LE edema.    How long can you walk comfortably? Short community distnaces  with a rolling walker.    Diagnostic tests X-ray.    Patient Stated Goals Get out of pain.    Currently in Pain? Yes    Pain Score 2     Pain Location Back    Pain Orientation Lower    Pain Descriptors / Indicators Sore;Discomfort    Pain Type Chronic pain    Pain Onset More than a month ago    Pain Frequency Intermittent    Multiple Pain Sites Yes    Pain Score 4    Pain Location Knee    Pain Orientation Left    Pain Descriptors / Indicators Aching    Pain Type Chronic pain    Pain Onset More than a month ago                                     PT Education - 06/25/20 1134    Education Details Reviwed HEP and goal discussion for discharge    Person(s) Educated Patient    Methods Explanation;Demonstration    Comprehension Verbalized understanding;Returned demonstration               PT Long Term Goals - 06/25/20 1138      PT LONG TERM GOAL #1   Title Independent with a HEP.    Status Achieved      PT LONG TERM GOAL #2   Title Sit to stand with pain not > 3/10.    Baseline Met 06/11/20 no pain in back    Status Achieved      PT LONG TERM GOAL #3   Title Supine to sit with pain not > 3/10.    Baseline Met 3/10 per reported    Period Weeks    Status Achieved                 Plan - 06/25/20 1140    Clinical Impression Statement Pt tolerating treatment well pt has made progress with improved functional mobility and is able to perform sit to stand with UE support. Pt has met all her LTG"s set at evaluation. Pt's HEP was reviwed. Pt will be discharged from PT services.    Personal Factors and Comorbidities Comorbidity 1;Comorbidity 3+;Comorbidity 2    Comorbidities AAA, Right TKA, CAD, DVT, LE edema.    Examination-Activity Limitations Other;Bed Mobility;Locomotion Level    Stability/Clinical Decision Making Evolving/Moderate complexity    Rehab Potential Good    PT Frequency 2x / week    PT Duration 6 weeks    PT  Treatment/Interventions ADLs/Self Care Home Management;Cryotherapy;Electrical Stimulation;Moist Heat;Ultrasound;Therapeutic exercise;Therapeutic activities;Functional mobility training;Patient/family education;Manual techniques;Dry  needling;Passive range of motion    PT Next Visit Plan Pt is discharged from PT services    Consulted and Agree with Plan of Care Patient           Patient will benefit from skilled therapeutic intervention in order to improve the following deficits and impairments:  Pain, Decreased activity tolerance, Decreased range of motion, Increased muscle spasms, Postural dysfunction  Visit Diagnosis: Abnormal posture  Acute left-sided low back pain without sciatica  Muscle weakness (generalized)  Chronic pain of right knee     Problem List Patient Active Problem List   Diagnosis Date Noted  . Precordial pain 08/30/2018  . Reactive airway disease with acute exacerbation 01/28/2018  . Left ventricular diastolic dysfunction, NYHA class 2 12/21/2013  . Non-rheumatic aortic sclerosis 12/21/2013  . AAA (abdominal aortic aneurysm) ([place]) 12/21/2013  . Abdominal aortic aneurysm ([place]) 12/21/2013  . 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 Permanent atrial fibrillation ([place])   . Edema of both legs   . 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 PVD, moderate bilat carotid disease 05/10/2012  . Dyslipidemia 05/10/2012  . Essential hypertension   . GERD (gastroesophageal reflux disease)   . Bladder cancer ([place])   . Rheumatoid arthritis(714.0)   . Primary bladder malignant neoplasm ([place]) 11/10/2011  . COLONIC POLYPS, ADENOMATOUS 01/26/2008  . HEMORRHOIDS, EXTERNAL 01/26/2008  . GASTRITIS, CHRONIC 01/26/2008  . HIATAL HERNIA 01/26/2008  . DIVERTICULOSIS OF COLON 01/26/2008  . Atrophic gastritis 01/26/2008    Oretha Caprice, PT, MPT 06/25/2020, 12:05 PM  [facility] [place], [place], [address] Phone: [phone]    Fax:  [phone]  Name: [name] MRN: [id number] Date of Birth: 1921-08-18

## 2020-07-02 ENCOUNTER — Telehealth: Payer: Self-pay | Admitting: *Deleted

## 2020-07-02 ENCOUNTER — Encounter: Payer: Self-pay | Admitting: Family Medicine

## 2020-07-02 ENCOUNTER — Other Ambulatory Visit: Payer: Self-pay

## 2020-07-02 ENCOUNTER — Ambulatory Visit (INDEPENDENT_AMBULATORY_CARE_PROVIDER_SITE_OTHER): Payer: PPO | Admitting: Family Medicine

## 2020-07-02 VITALS — BP 140/78 | HR 102 | Temp 97.0°F | Ht 65.0 in | Wt 149.0 lb

## 2020-07-02 DIAGNOSIS — G8929 Other chronic pain: Secondary | ICD-10-CM

## 2020-07-02 DIAGNOSIS — I482 Chronic atrial fibrillation, unspecified: Secondary | ICD-10-CM | POA: Diagnosis not present

## 2020-07-02 DIAGNOSIS — M25561 Pain in right knee: Secondary | ICD-10-CM | POA: Diagnosis not present

## 2020-07-02 DIAGNOSIS — Z96651 Presence of right artificial knee joint: Secondary | ICD-10-CM | POA: Diagnosis not present

## 2020-07-02 DIAGNOSIS — R791 Abnormal coagulation profile: Secondary | ICD-10-CM

## 2020-07-02 LAB — COAGUCHEK XS/INR WAIVED
INR: 3.4 — ABNORMAL HIGH (ref 0.9–1.1)
Prothrombin Time: 40.5 s

## 2020-07-02 MED ORDER — LIDOCAINE 5 % EX PTCH: 1.0000 | MEDICATED_PATCH | CUTANEOUS | 0 refills | Status: DC

## 2020-07-02 NOTE — Patient Instructions (Addendum)
Come back for coumadin check Wednesday 7/28 at 11:15am.  Blood is too thin today. ONLY TAKE 1 tablet of coumadin today.  Then continue normal dosing. Eat some greens this week.

## 2020-07-02 NOTE — Progress Notes (Signed)
Subjective: CC: INR check PCP: Janora Norlander, DO TMH:[id number] [name] is a 86 y.o. female presenting to clinic today for:  1.  Chronic anticoagulation for atrial fibrillation and history of DVT Patient with known chronic atrial fibrillation with chronic anticoagulation on Coumadin.  Goal INR is 2-3.    Patient was therapeutic last visit but she has not been seen in about 2 months for recheck. Reports occasional scant blood when she wipes her bottom. Denies any hematuria, palpitations, CP, Shortness of breath.  2. Right knee pain Patient with chronic right-sided knee pain and swelling.  She has history of total knee replacement a few years ago.  She seen her orthopedist a couple of times but they could not find the etiology of her pain.  She no longer gets knee injections because she developed allergy at one point to them.  She has been using topical Voltaren gel but has not found this to be helpful anymore.  She tried Biofreeze again minimal improvement.  She is now utilizing a walker due to instability with that right knee.   ROS: Per HPI  Allergies  Allergen Reactions   Contrast Media [Iodinated Diagnostic Agents] Hives   Amoxicillin Itching   Ampicillin Itching   Cortisol [Hydrocortisone] Itching   Ivp Dye [Iodinated Diagnostic Agents] Hives   Levaquin [Levofloxacin] Itching and Rash   Penicillins Rash    "haven't had it in years"   Vancomycin Itching and Rash   Past Medical History:  Diagnosis Date   AAA (abdominal aortic aneurysm) (HCC)    Allergy    Anxiety    Bladder cancer ([place]) dx'd 2011   Chronic microscopic hematuria; transitional cell cancer   Blood transfusion 1960   "related to hysterectomy"   Chronic atrial fibrillation ([place])    Anticoagulated with warfarin, rate control with diltiazem and Toprol   Chronic back pain    Chronic headache    "probably 2/wk" (08/28/2015)   Concussion w/o coma 9/89/2119   Complicated by subarachnoid  hemorrhage.  "even now has times when she's not able to comprehend" (05/08/12)   Coronary artery disease    Nonobstructive   Diverticulitis    Diverticulosis    DVT (deep venous thrombosis), right 2005   "right calf after 8 foot fall"   Edema of both legs    Chronic, thought to be secondary to DVTs   External hemorrhoids    Family history of adverse reaction to anesthesia 2012   daughter "had OR for crushed hand; had problems w/anesthesia & I was in there all day long" (   Fatty liver    Frequent UTI    GERD (gastroesophageal reflux disease)    H/O hiatal hernia    High cholesterol    Migraine    "rare now" (08/28/2015)   Pneumonia ~ 2010;  2013; 07/2015   Rheumatoid arthritis(714.0)    "hands" (08/28/2015)   Urinary frequency     Current Outpatient Medications:    atorvastatin (LIPITOR) 10 MG tablet, Take 10 mg by mouth daily., Disp: , Rfl:    calcium carbonate (OS-CAL - DOSED IN MG OF ELEMENTAL CALCIUM) 1250 MG tablet, Take 1 tablet by mouth every morning. , Disp: , Rfl:    cholecalciferol (VITAMIN D) 1000 UNITS tablet, Take 1,000 Units by mouth daily., Disp: , Rfl:    DILT-XR 240 MG 24 hr capsule, TAKE 1 CAPSULE BY MOUTH EVERY MORNING ON AN EMPTY STOMACH, Disp: 90 capsule, Rfl: 5   furosemide (LASIX) 20 MG  tablet, Take 1 tablet (20 mg total) by mouth daily. (Patient taking differently: Take 20-40 mg by mouth daily. Take one tablet (20MG ) by mouth daily except on Mon, Wed, Friday take 2 tablets (40 MG)), Disp: 30 tablet, Rfl: 0   loratadine (CLARITIN) 10 MG tablet, TAKE 1 TABLET BY MOUTH ONCE DAILY FOR ALLERGY, Disp: , Rfl:    Multiple Vitamins-Minerals (MULTIVITAMINS THER. W/MINERALS) TABS, Take 1 tablet by mouth daily. , Disp: , Rfl:    Multiple Vitamins-Minerals (PRESERVISION AREDS 2+MULTI VIT PO), Take by mouth., Disp: , Rfl:    olopatadine (PATANOL) 0.1 % ophthalmic solution, 1 drop 2 (two) times daily., Disp: , Rfl:    Omega-3 Fatty Acids (FISH OIL)  1000 MG CAPS, Take 1,000 mg by mouth daily., Disp: , Rfl:    omeprazole (PRILOSEC) 20 MG capsule, TAKE 1 CAPSULE BY MOUTH TWICE A DAY, Disp: 180 capsule, Rfl: 2   PROAIR HFA 108 (90 Base) MCG/ACT inhaler, TAKE 2 PUFFS BY MOUTH EVERY 6 HOURS AS NEEDED FOR WHEEZE OR SHORTNESS OF BREATH, Disp: 8.5 Inhaler, Rfl: 4   warfarin (COUMADIN) 3 MG tablet, Take 2 tablets (6mg ) on Mondays, Wednesdays, Fridays, and Saturdays and 1 tablet (3mg ) on Tuesdays, Thursdays, and Sundays, Disp: 60 tablet, Rfl: 3   diclofenac Sodium (VOLTAREN) 1 % GEL, Apply topically as needed. (Patient not taking: Reported on 07/02/2020), Disp: , Rfl:    lidocaine (XYLOCAINE) 5 % ointment, Apply 1 application topically as needed. (Patient not taking: Reported on 07/02/2020), Disp: 35.44 g, Rfl: 3 Social History   Socioeconomic History   Marital status: Widowed    Spouse name: Not on file   Number of children: 3   Years of education: Not on file   Highest education level: 8th grade  Occupational History   Occupation: Retired  Tobacco Use   Smoking status: Former Smoker    Packs/day: 1.00    Years: 40.00    Pack years: 40.00    Types: Cigarettes    Quit date: 11/07/1982    Years since quitting: 37.6   Smokeless tobacco: Former Systems developer    Types: Snuff  Vaping Use   Vaping Use: Never used  Substance and Sexual Activity   Alcohol use: Yes    Alcohol/week: 7.0 standard drinks    Types: 7 Glasses of wine per week    Comment: 1 glass of wine prior to bed   Drug use: No   Sexual activity: Not Currently    Birth control/protection: Surgical  Other Topics Concern   Not on file  Social History Narrative   ** Merged History Encounter **       She is a widowed mother of 46, grandmother of 73, great grandmother of 52. She does not   really get routine exercise. She quit smoking in 1983. She does not smoke and does not use illicit drugs and does not drink.   Social Determinants of Health   Financial Resource Strain:     Difficulty of Paying Living Expenses:   Food Insecurity:    Worried About Charity fundraiser in the Last Year:    Arboriculturist in the Last Year:   Transportation Needs:    Film/video editor (Medical):    Lack of Transportation (Non-Medical):   Physical Activity:    Days of Exercise per Week:    Minutes of Exercise per Session:   Stress:    Feeling of Stress :   Social Connections:    Frequency of  Communication with Friends and Family:    Frequency of Social Gatherings with Friends and Family:    Attends Religious Services:    Active Member of Clubs or Organizations:    Attends Music therapist:    Marital Status:   Intimate Partner Violence:    Fear of Current or Ex-Partner:    Emotionally Abused:    Physically Abused:    Sexually Abused:    Family History  Problem Relation Age of Onset   Anesthesia problems Daughter    Cancer Mother        uterine   Cancer Sister        leukemia   Cancer Brother    Prostate cancer Brother    Cancer Brother    Breast cancer Other        neice   Heart disease Brother    Cancer Brother    Heart attack Sister    Colon cancer Neg Hx     Objective: Office vital signs reviewed. BP 140/78    Pulse (!) 102    Temp (!) 97 F (36.1 C) (Temporal)    Ht 5\' 5"  (1.651 m)    Wt 149 lb (67.6 kg)    SpO2 97%    BMI 24.79 kg/m   Physical Examination:  General: Awake, alert, well nourished, well appearing. No acute distress HEENT: Sclera white.  MMM Cardio: Irregularly irregular.  Rate controlled.  S1S2 heard, no murmurs appreciated Pulm: Clear to auscultation bilaterally.  No wheezes, rhonchi or rales.  Normal work of breathing on room air. MSK: Ambulating with rolling walker.  She has swelling of the right knee extending to the right lower leg.  Assessment/ Plan: 86 y.o. female   1. Supratherapeutic INR INR supratherapeutic at 3.4.  She only took 1 tablet of Coumadin today and then  resume normal dosing.  Encouraged consumption of greens this week.  She has been scheduled for 1115 next Wednesday for recheck  2. Chronic atrial fibrillation (HCC) - CoaguChek XS/INR Waived  3. Chronic knee pain after total replacement of right knee joint Trial of Lidoderm patch to the right knee. - lidocaine (LIDODERM) 5 %; Place 1 patch onto the skin daily. (knee). Remove & Discard patch within 12 hours or as directed by MD  Dispense: 30 patch; Refill: 0    Orders Placed This Encounter  Procedures   CoaguChek XS/INR Waived   No orders of the defined types were placed in this encounter.    Janora Norlander, DO [place] [phone]

## 2020-07-02 NOTE — Telephone Encounter (Signed)
PA Case: [id number], Status: Approved, Coverage Starts on: 07/02/2020 12:00:00 AM, Coverage Ends on: 07/02/2021 12:00:00 AM.  Pharmacy notified

## 2020-07-02 NOTE — Telephone Encounter (Signed)
KeyKrystal Clark - PA Case ID: [id number] - Rx #: [id number]

## 2020-07-09 ENCOUNTER — Encounter: Payer: Self-pay | Admitting: Family Medicine

## 2020-07-09 ENCOUNTER — Other Ambulatory Visit: Payer: Self-pay

## 2020-07-09 ENCOUNTER — Ambulatory Visit (INDEPENDENT_AMBULATORY_CARE_PROVIDER_SITE_OTHER): Payer: PPO | Admitting: Family Medicine

## 2020-07-09 VITALS — BP 139/81 | HR 91 | Temp 97.1°F | Ht 65.0 in | Wt 152.0 lb

## 2020-07-09 DIAGNOSIS — I482 Chronic atrial fibrillation, unspecified: Secondary | ICD-10-CM | POA: Diagnosis not present

## 2020-07-09 DIAGNOSIS — Z7901 Long term (current) use of anticoagulants: Secondary | ICD-10-CM

## 2020-07-09 LAB — COAGUCHEK XS/INR WAIVED
INR: 2.5 — ABNORMAL HIGH (ref 0.9–1.1)
Prothrombin Time: 29.5 s

## 2020-07-09 NOTE — Progress Notes (Signed)
Subjective: CC: INR check PCP: Janora Norlander, DO OBS:[name] is a 86 y.o. female presenting to clinic today for:  1.  Chronic anticoagulation for atrial fibrillation and history of DVT Patient with known chronic atrial fibrillation with chronic anticoagulation on Coumadin.  Goal INR is 2-3.    Patient was supratherapeutic last visit.  She was instructed to skip a days' dose and then resume normal dosing.  Denies any hematuria, palpitations, CP, Shortness of breath.  ROS: Per HPI  Allergies  Allergen Reactions  . Contrast Media [Iodinated Diagnostic Agents] Hives  . Amoxicillin Itching  . Ampicillin Itching  . Cortisol [Hydrocortisone] Itching  . Ivp Dye [Iodinated Diagnostic Agents] Hives  . Levaquin [Levofloxacin] Itching and Rash  . Penicillins Rash    "haven't had it in years"  . Vancomycin Itching and Rash   Past Medical History:  Diagnosis Date  . AAA (abdominal aortic aneurysm) ([place])   . Allergy   . Anxiety   . Bladder cancer ([place]) dx'd 2011   Chronic microscopic hematuria; transitional cell cancer  . Blood transfusion 1960   "related to hysterectomy"  . Chronic atrial fibrillation (HCC)    Anticoagulated with warfarin, rate control with diltiazem and Toprol  . Chronic back pain   . Chronic headache    "probably 2/wk" (08/28/2015)  . Concussion w/o coma 2/94/7654   Complicated by subarachnoid hemorrhage.  "even now has times when she's not able to comprehend" (05/08/12)  . Coronary artery disease    Nonobstructive  . Diverticulitis   . Diverticulosis   . DVT (deep venous thrombosis), right 2005   "right calf after 8 foot fall"  . Edema of both legs    Chronic, thought to be secondary to DVTs  . External hemorrhoids   . Family history of adverse reaction to anesthesia 2012   daughter "had OR for crushed hand; had problems w/anesthesia & I was in there all day long" (  . Fatty liver   . Frequent UTI   . GERD (gastroesophageal reflux  disease)   . H/O hiatal hernia   . High cholesterol   . Migraine    "rare now" (08/28/2015)  . Pneumonia ~ 2010;  2013; 07/2015  . Rheumatoid arthritis(714.0)    "hands" (08/28/2015)  . Urinary frequency     Current Outpatient Medications:  .  atorvastatin (LIPITOR) 10 MG tablet, Take 10 mg by mouth daily., Disp: , Rfl:  .  calcium carbonate (OS-CAL - DOSED IN MG OF ELEMENTAL CALCIUM) 1250 MG tablet, Take 1 tablet by mouth every morning. , Disp: , Rfl:  .  cholecalciferol (VITAMIN D) 1000 UNITS tablet, Take 1,000 Units by mouth daily., Disp: , Rfl:  .  diclofenac Sodium (VOLTAREN) 1 % GEL, Apply topically as needed. (Patient not taking: Reported on 07/02/2020), Disp: , Rfl:  .  DILT-XR 240 MG 24 hr capsule, TAKE 1 CAPSULE BY MOUTH EVERY MORNING ON AN EMPTY STOMACH, Disp: 90 capsule, Rfl: 5 .  furosemide (LASIX) 20 MG tablet, Take 1 tablet (20 mg total) by mouth daily. (Patient taking differently: Take 20-40 mg by mouth daily. Take one tablet (20MG ) by mouth daily except on Mon, Wed, Friday take 2 tablets (40 MG)), Disp: 30 tablet, Rfl: 0 .  lidocaine (LIDODERM) 5 %, Place 1 patch onto the skin daily. (knee). Remove & Discard patch within 12 hours or as directed by MD, Disp: 30 patch, Rfl: 0 .  loratadine (CLARITIN) 10 MG tablet, TAKE 1 TABLET BY  MOUTH ONCE DAILY FOR ALLERGY, Disp: , Rfl:  .  Multiple Vitamins-Minerals (MULTIVITAMINS THER. W/MINERALS) TABS, Take 1 tablet by mouth daily. , Disp: , Rfl:  .  Multiple Vitamins-Minerals (PRESERVISION AREDS 2+MULTI VIT PO), Take by mouth., Disp: , Rfl:  .  olopatadine (PATANOL) 0.1 % ophthalmic solution, 1 drop 2 (two) times daily., Disp: , Rfl:  .  Omega-3 Fatty Acids (FISH OIL) 1000 MG CAPS, Take 1,000 mg by mouth daily., Disp: , Rfl:  .  omeprazole (PRILOSEC) 20 MG capsule, TAKE 1 CAPSULE BY MOUTH TWICE A DAY, Disp: 180 capsule, Rfl: 2 .  PROAIR HFA 108 (90 Base) MCG/ACT inhaler, TAKE 2 PUFFS BY MOUTH EVERY 6 HOURS AS NEEDED FOR WHEEZE OR SHORTNESS  OF BREATH, Disp: 8.5 Inhaler, Rfl: 4 .  warfarin (COUMADIN) 3 MG tablet, Take 2 tablets (6mg ) on Mondays, Wednesdays, Fridays, and Saturdays and 1 tablet (3mg ) on Tuesdays, Thursdays, and Sundays, Disp: 60 tablet, Rfl: 3 Social History   Socioeconomic History  . Marital status: Widowed    Spouse name: Not on file  . Number of children: 3  . Years of education: Not on file  . Highest education level: 8th grade  Occupational History  . Occupation: Retired  Tobacco Use  . Smoking status: Former Smoker    Packs/day: 1.00    Years: 40.00    Pack years: 40.00    Types: Cigarettes    Quit date: 11/07/1982    Years since quitting: 37.6  . Smokeless tobacco: Former Systems developer    Types: Snuff  Vaping Use  . Vaping Use: Never used  Substance and Sexual Activity  . Alcohol use: Yes    Alcohol/week: 7.0 standard drinks    Types: 7 Glasses of wine per week    Comment: 1 glass of wine prior to bed  . Drug use: No  . Sexual activity: Not Currently    Birth control/protection: Surgical  Other Topics Concern  . Not on file  Social History Narrative   ** Merged History Encounter **       She is a widowed mother of 76, grandmother of 38, great grandmother of 57. She does not   really get routine exercise. She quit smoking in 1983. She does not smoke and does not use illicit drugs and does not drink.   Social Determinants of Health   Financial Resource Strain:   . Difficulty of Paying Living Expenses:   Food Insecurity:   . Worried About Charity fundraiser in the Last Year:   . Arboriculturist in the Last Year:   Transportation Needs:   . Film/video editor (Medical):   Marland Kitchen Lack of Transportation (Non-Medical):   Physical Activity:   . Days of Exercise per Week:   . Minutes of Exercise per Session:   Stress:   . Feeling of Stress :   Social Connections:   . Frequency of Communication with Friends and Family:   . Frequency of Social Gatherings with Friends and Family:   . Attends  Religious Services:   . Active Member of Clubs or Organizations:   . Attends Archivist Meetings:   Marland Kitchen Marital Status:   Intimate Partner Violence:   . Fear of Current or Ex-Partner:   . Emotionally Abused:   Marland Kitchen Physically Abused:   . Sexually Abused:    Family History  Problem Relation Age of Onset  . Anesthesia problems Daughter   . Cancer Mother  uterine  . Cancer Sister        leukemia  . Cancer Brother   . Prostate cancer Brother   . Cancer Brother   . Breast cancer Other        neice  . Heart disease Brother   . Cancer Brother   . Heart attack Sister   . Colon cancer Neg Hx     Objective: Office vital signs reviewed. BP (!) 139/81   Pulse 91   Temp (!) 97.1 F (36.2 C) (Temporal)   Ht 5\' 5"  (1.651 m)   Wt 152 lb (68.9 kg)   SpO2 98%   BMI 25.29 kg/m   Physical Examination:  General: Awake, alert,  well appearing. No acute distress HEENT: Sclera white.  MMM Cardio: Irregularly irregular.  Rate controlled.  S1S2 heard, no murmurs appreciated Pulm: Clear to auscultation bilaterally.  No wheezes, rhonchi or rales.  Normal work of breathing on room air. MSK: Ambulating with rolling walker.     Assessment/ Plan: 86 y.o. female   Chronic atrial fibrillation ([place]) - Plan: CoaguChek XS/INR Waived  Chronic anticoagulation - Plan: CoaguChek XS/INR Waived  INR therapeutic.  Continue current dosing. Follow up in 6 weeks.   No orders of the defined types were placed in this encounter.  No orders of the defined types were placed in this encounter.    Janora Norlander, DO [place] [phone]

## 2020-07-09 NOTE — Patient Instructions (Signed)
Next appointment we will do repeat arm blood work.  Coumadin level looks good.  Go back to your normal coumadin schedule.

## 2020-08-01 ENCOUNTER — Other Ambulatory Visit: Payer: Self-pay

## 2020-08-01 ENCOUNTER — Other Ambulatory Visit (HOSPITAL_COMMUNITY): Payer: Self-pay | Admitting: Cardiology

## 2020-08-01 ENCOUNTER — Ambulatory Visit (HOSPITAL_COMMUNITY)
Admission: RE | Admit: 2020-08-01 | Discharge: 2020-08-01 | Disposition: A | Discharge status: Home / Self Care | Payer: PPO | Source: Ambulatory Visit | Attending: Cardiovascular Disease | Admitting: Cardiovascular Disease

## 2020-08-01 DIAGNOSIS — I714 Abdominal aortic aneurysm, without rupture, unspecified: Secondary | ICD-10-CM

## 2020-08-14 ENCOUNTER — Telehealth: Payer: Self-pay | Admitting: Cardiology

## 2020-08-14 NOTE — Telephone Encounter (Signed)
Called and spoke with pt's daughter per DPR. Reviewed VAS [place]. Daughter verbalized understanding. Asked that if her mother began having issues if she could just make an appt. Notified that this was fine and if they had any other questions to let us know. Daughter verbalized understanding with no other questions at this time.

## 2020-08-14 NOTE — Telephone Encounter (Signed)
Patient's daughter is calling about her mom's ultrasound

## 2020-08-25 ENCOUNTER — Other Ambulatory Visit: Payer: Self-pay

## 2020-08-25 ENCOUNTER — Encounter: Payer: Self-pay | Admitting: Nurse Practitioner

## 2020-08-25 ENCOUNTER — Ambulatory Visit (INDEPENDENT_AMBULATORY_CARE_PROVIDER_SITE_OTHER): Payer: PPO | Admitting: Nurse Practitioner

## 2020-08-25 VITALS — BP 134/59 | HR 90 | Temp 97.8°F | Ht 65.0 in | Wt 152.6 lb

## 2020-08-25 DIAGNOSIS — Z7901 Long term (current) use of anticoagulants: Secondary | ICD-10-CM | POA: Diagnosis not present

## 2020-08-25 LAB — COAGUCHEK XS/INR WAIVED
INR: 2.6 — ABNORMAL HIGH (ref 0.9–1.1)
Prothrombin Time: 31.7 s

## 2020-08-25 NOTE — Assessment & Plan Note (Signed)
Patient is therapeutic with current Coumadin dose.  No changes to current dose, next INR in 5 to 6 weeks.

## 2020-08-25 NOTE — Addendum Note (Signed)
Addended by: Baldomero Lamy B on: 08/25/2020 03:34 PM   Modules accepted: Orders

## 2020-08-25 NOTE — Progress Notes (Signed)
Established Patient Office Visit  Subjective:  Patient ID: [name], female    DOB: 1921-05-26  Age: 86 y.o. MRN: [id number]  CC:  Chief Complaint  Patient presents with  . Atrial Fibrillation    HPI [name] presents for Anticoagulation: Patient here for anticoagulation monitoring. Indication: atrial fibrillation Bleeding Signs/Symptoms:  None Thromboembolic Signs/Symptoms:  None  Missed Coumadin Doses:  None Medication Changes:  no Dietary Changes:  no Bacterial/Viral Infection:  no  Other Concerns:  no  INR/Prothrombin Time 2.6 Current coumadin dose : 3 mg  2 tablet M, W, F, S and 1 tablet T, TH, S  Past Medical History:  Diagnosis Date  . AAA (abdominal aortic aneurysm) ([place])   . Allergy   . Anxiety   . Bladder cancer ([place]) dx'd 2011   Chronic microscopic hematuria; transitional cell cancer  . Blood transfusion 1960   "related to hysterectomy"  . Chronic atrial fibrillation (HCC)    Anticoagulated with warfarin, rate control with diltiazem and Toprol  . Chronic back pain   . Chronic headache    "probably 2/wk" (08/28/2015)  . Concussion w/o coma 9/41/7408   Complicated by subarachnoid hemorrhage.  "even now has times when she's not able to comprehend" (05/08/12)  . Coronary artery disease    Nonobstructive  . Diverticulitis   . Diverticulosis   . DVT (deep venous thrombosis), right 2005   "right calf after 8 foot fall"  . Edema of both legs    Chronic, thought to be secondary to DVTs  . External hemorrhoids   . Family history of adverse reaction to anesthesia 2012   daughter "had OR for crushed hand; had problems w/anesthesia & I was in there all day long" (  . Fatty liver   . Frequent UTI   . GERD (gastroesophageal reflux disease)   . H/O hiatal hernia   . High cholesterol   . Migraine    "rare now" (08/28/2015)  . Pneumonia ~ 2010;  2013; 07/2015  . Rheumatoid arthritis(714.0)    "hands" (08/28/2015)  . Urinary frequency      Past Surgical History:  Procedure Laterality Date  . BREAST CYST EXCISION Left 1960's?   2 cysts; benign  . CARDIAC CATHETERIZATION  1980's  . CATARACT EXTRACTION W/ INTRAOCULAR LENS  IMPLANT, BILATERAL Bilateral 1992  . CYSTOSCOPY  11/11/2011   Procedure: CYSTOSCOPY;  Surgeon: Malka So;  Location: [facility];  Service: Urology;  Laterality: N/A;  . CYSTOSTOMY W/ BLADDER BIOPSY  10/08/2013  . INCONTINENCE SURGERY  1980's  . JOINT REPLACEMENT    . KNEE ARTHROSCOPY Right 2005   S/P fall  . Lower Extremity Venous Doppler  11/10/2013   No DVT or superficial thrombus enlarged inguinal lymph node noted in the right. No Baker's cyst.  . TONSILLECTOMY  1952  . TOTAL KNEE ARTHROPLASTY Right 02/05/2013   Procedure: TOTAL KNEE ARTHROPLASTY;  Surgeon: Mauri Pole, MD;  Location: [facility];  Service: Orthopedics;  Laterality: Right;  . TRANSTHORACIC ECHOCARDIOGRAM  01/'15; 4/'19   a) EF 60-65%. NO RWMA. Ao Sclerosis. MAC - no MS with mild MR>  B Atriae mildly dilated;; b)  EF 55-60%. AoV Sclerosis (no stenosis).  Trivial AI & MR. Mild LAE & Severe RAE.. Mild RV dilation  . TRANSURETHRAL RESECTION OF BLADDER TUMOR  11/11/2011   Procedure: TRANSURETHRAL RESECTION OF BLADDER TUMOR (TURBT);  Surgeon: Malka So;  Location: [facility];  Service: Urology;  Laterality: N/A;  Cysto, Bladder Biopsy,  TURBT with Gyrus,   . TRANSURETHRAL RESECTION OF BLADDER TUMOR WITH GYRUS (TURBT-GYRUS)  2007; 2009; 2010   "for tumors on surface of bladder"  . TUMOR EXCISION  1976; 1980's   "fatty tumor cut off her upper back; left thumb"  . VAGINAL HYSTERECTOMY  1960   partial     Family History  Problem Relation Age of Onset  . Anesthesia problems Daughter   . Cancer Mother        uterine  . Cancer Sister        leukemia  . Cancer Brother   . Prostate cancer Brother   . Cancer Brother   . Breast cancer Other        neice  . Heart disease Brother   . Cancer Brother   . Heart attack Sister   . Colon cancer  Neg Hx     Social History   Socioeconomic History  . Marital status: Widowed    Spouse name: Not on file  . Number of children: 3  . Years of education: Not on file  . Highest education level: 8th grade  Occupational History  . Occupation: Retired  Tobacco Use  . Smoking status: Former Smoker    Packs/day: 1.00    Years: 40.00    Pack years: 40.00    Types: Cigarettes    Quit date: 11/07/1982    Years since quitting: 37.8  . Smokeless tobacco: Former Systems developer    Types: Snuff  Vaping Use  . Vaping Use: Never used  Substance and Sexual Activity  . Alcohol use: Yes    Alcohol/week: 7.0 standard drinks    Types: 7 Glasses of wine per week    Comment: 1 glass of wine prior to bed  . Drug use: No  . Sexual activity: Not Currently    Birth control/protection: Surgical  Other Topics Concern  . Not on file  Social History Narrative    Merged History Encounter        She is a widowed mother of 44, grandmother of 68, great grandmother of 83. She does not   really get routine exercise. She quit smoking in 1983. She does not smoke and does not use illicit drugs and does not drink.   Social Determinants of Health   Financial Resource Strain:   .   Food Insecurity:   .   Marland Kitchen   Transportation Needs:   .   Marland Kitchen   Physical Activity:   .   Marland Kitchen   Stress:   .   Social Connections:   .   .   .   .   .   .   Intimate Partner Violence:   .   .   .   .     Outpatient Medications Prior to Visit  Medication Sig Dispense Refill  . atorvastatin (LIPITOR) 10 MG tablet Take 10 mg by mouth daily.    . calcium carbonate (OS-CAL - DOSED IN MG OF ELEMENTAL CALCIUM) 1250 MG tablet Take 1 tablet by mouth every morning.     . cholecalciferol (VITAMIN D) 1000 UNITS tablet Take 1,000 Units by mouth daily.    . diclofenac Sodium (VOLTAREN) 1 % GEL Apply topically as needed.     Marland Kitchen DILT-XR 240 MG 24 hr capsule TAKE 1 CAPSULE BY MOUTH EVERY MORNING ON AN EMPTY STOMACH 90 capsule 5  . furosemide  (LASIX) 20 MG tablet Take 1 tablet (20 mg total) by mouth daily. (Patient taking  differently: Take 20-40 mg by mouth daily. Take one tablet (20MG) by mouth daily except on Mon, Wed, Friday take 2 tablets (40 MG)) 30 tablet 0  . lidocaine (LIDODERM) 5 % Place 1 patch onto the skin daily. (knee). Remove & Discard patch within 12 hours or as directed by MD 30 patch 0  . loratadine (CLARITIN) 10 MG tablet TAKE 1 TABLET BY MOUTH ONCE DAILY FOR ALLERGY    . Multiple Vitamins-Minerals (MULTIVITAMINS THER. W/MINERALS) TABS Take 1 tablet by mouth daily.     . Multiple Vitamins-Minerals (PRESERVISION AREDS 2+MULTI VIT PO) Take by mouth.    Marland Kitchen olopatadine (PATANOL) 0.1 % ophthalmic solution 1 drop 2 (two) times daily.    . Omega-3 Fatty Acids (FISH OIL) 1000 MG CAPS Take 1,000 mg by mouth daily.    Marland Kitchen omeprazole (PRILOSEC) 20 MG capsule TAKE 1 CAPSULE BY MOUTH TWICE A DAY 180 capsule 2  . PROAIR HFA 108 (90 Base) MCG/ACT inhaler TAKE 2 PUFFS BY MOUTH EVERY 6 HOURS AS NEEDED FOR WHEEZE OR SHORTNESS OF BREATH 8.5 Inhaler 4  . warfarin (COUMADIN) 3 MG tablet Take 2 tablets (77m) on Mondays, Wednesdays, Fridays, and Saturdays and 1 tablet (324m on Tuesdays, Thursdays, and Sundays 60 tablet 3   No facility-administered medications prior to visit.    Allergies  Allergen Reactions  . Contrast Media [Iodinated Diagnostic Agents] Hives  . Amoxicillin Itching  . Ampicillin Itching  . Cortisol [Hydrocortisone] Itching  . Ivp Dye [Iodinated Diagnostic Agents] Hives  . Levaquin [Levofloxacin] Itching and Rash  . Penicillins Rash    "haven't had it in years"  . Vancomycin Itching and Rash    ROS Review of Systems  Musculoskeletal: Positive for joint swelling.  All other systems reviewed and are negative.     Objective:    Physical Exam Vitals reviewed.  Constitutional:      Appearance: Normal appearance.  HENT:     Head: Normocephalic.  Eyes:     Conjunctiva/sclera: Conjunctivae normal.   Cardiovascular:     Rate and Rhythm: Normal rate and regular rhythm.     Pulses: Normal pulses.     Heart sounds: Normal heart sounds.  Pulmonary:     Effort: Pulmonary effort is normal.     Breath sounds: Normal breath sounds.  Abdominal:     General: Bowel sounds are normal.  Musculoskeletal:        General: Tenderness present.  Neurological:     Mental Status: She is alert and oriented to person, place, and time.     BP (!) 134/59   Pulse 90   Temp 97.8 F (36.6 C)   Ht _0  (1.651 m)   Wt 152 lb 9.6 oz (69.2 kg)   SpO2 98%   BMI 25.39 kg/m  Wt Readings from Last 3 Encounters:  08/25/20 152 lb 9.6 oz (69.2 kg)  07/09/20 152 lb (68.9 kg)  07/02/20 149 lb (67.6 kg)     Health Maintenance Due  Topic Date Due  . COVID-19 Vaccine (1) Never done  . TETANUS/TDAP  Never done  . INFLUENZA VACCINE  07/13/2020      Lab Results  Component Value Date   TSH 0.822 01/29/2018   Lab Results  Component Value Date   WBC WILL FOLLOW 03/12/2020   HGB WILL FOLLOW 03/12/2020   HCT WILL FOLLOW 03/12/2020   MCV WILL FOLLOW 03/12/2020   PLT WILL FOLLOW 03/12/2020   Lab Results  Component Value Date   NA  140 03/12/2020   K 4.0 03/12/2020   CO2 21 03/12/2020   GLUCOSE 116 (H) 03/12/2020   BUN 13 03/12/2020   CREATININE 0.77 03/12/2020   BILITOT 0.7 02/02/2018   ALKPHOS 56 02/02/2018   AST 29 02/02/2018   ALT 36 02/02/2018   PROT 6.0 (L) 02/02/2018   ALBUMIN 3.1 (L) 02/02/2018   CALCIUM 8.9 03/12/2020   ANIONGAP 13 12/18/2019   GFR 69.67 06/11/2013    Lab Results  Component Value Date   HGBA1C (H) 09/18/2007    6.3 (NOTE)   The ADA recommends the following therapeutic goals for glycemic   control related to Hgb A1C measurement:   Goal of Therapy:   < 7.0% Hgb A1C   Action Suggested:  > 8.0% Hgb A1C   Ref:  Diabetes Care, 22, Suppl. 1, 1999      Assessment & Plan:  Chronic anticoagulation Patient is therapeutic with current Coumadin dose.  No changes to  current dose, next INR in 5 to 6 weeks.  Problem List Items Addressed This Visit    None    Visit Diagnoses    Chronic anticoagulation    -  Primary         Follow-up: No follow-ups on file.    Ivy Lynn, NP

## 2020-09-08 ENCOUNTER — Other Ambulatory Visit: Payer: Self-pay | Admitting: Family Medicine

## 2020-10-06 ENCOUNTER — Telehealth: Payer: Self-pay

## 2020-10-06 NOTE — Telephone Encounter (Signed)
Appt made.  Patient aware  

## 2020-10-06 NOTE — Telephone Encounter (Signed)
Left message to call back  

## 2020-10-08 ENCOUNTER — Encounter: Payer: Self-pay | Admitting: Family Medicine

## 2020-10-08 ENCOUNTER — Other Ambulatory Visit: Payer: Self-pay

## 2020-10-08 ENCOUNTER — Ambulatory Visit (INDEPENDENT_AMBULATORY_CARE_PROVIDER_SITE_OTHER): Payer: PPO | Admitting: Family Medicine

## 2020-10-08 VITALS — BP 136/79 | HR 83 | Temp 97.3°F | Ht 65.0 in | Wt 153.0 lb

## 2020-10-08 DIAGNOSIS — Z7901 Long term (current) use of anticoagulants: Secondary | ICD-10-CM | POA: Diagnosis not present

## 2020-10-08 DIAGNOSIS — G8929 Other chronic pain: Secondary | ICD-10-CM

## 2020-10-08 DIAGNOSIS — Z23 Encounter for immunization: Secondary | ICD-10-CM

## 2020-10-08 DIAGNOSIS — M25561 Pain in right knee: Secondary | ICD-10-CM

## 2020-10-08 DIAGNOSIS — K921 Melena: Secondary | ICD-10-CM

## 2020-10-08 DIAGNOSIS — I482 Chronic atrial fibrillation, unspecified: Secondary | ICD-10-CM | POA: Diagnosis not present

## 2020-10-08 LAB — POCT INR: INR: 2.6 (ref 2–3)

## 2020-10-08 LAB — COAGUCHEK XS/INR WAIVED
INR: 2.6 — ABNORMAL HIGH (ref 0.9–1.1)
Prothrombin Time: 31.1 s

## 2020-10-08 MED ORDER — WARFARIN SODIUM 3 MG PO TABS: ORAL_TABLET | ORAL | 3 refills | Status: DC

## 2020-10-08 NOTE — Progress Notes (Signed)
Subjective: CC: INR check, follow-up A. fib PCP: Janora Norlander, DO KGY:JEHUDJS E Chiem is a 86 y.o. female presenting to clinic today for:  1.  Chronic anticoagulation for atrial fibrillation and history of DVT Patient with known chronic atrial fibrillation with chronic anticoagulation on Coumadin.  Goal INR is 2-3.    Patient was noted to be therapeutic (2.6) in September.  She is here for follow-up of INR.  She continues to take her Coumadin 6 mg Mondays, Wednesdays, Fridays with 3 mg Tuesdays, Thursdays, Saturdays and Sundays.  She did have one episode of blood in the toilet 1 to 2 weeks ago where she thinks she wiped her bottom too hard because she was constipated.  She says been on Metamucil and not had any recurrence.  Denies any dizziness, shortness of breath.  2.  Right knee pain Patient with ongoing right-sided knee pain.  She was previously evaluated by Dr. Ihor Gully for this and he recommended physical therapy.  However, she notes that the physical therapy worsened her knee pain.  She would like to get a second opinion as the pain is now radiating up her leg.  She uses a rolling walker for ambulation.  Denies any falls  ROS: Per HPI  Allergies  Allergen Reactions  . Contrast Media [Iodinated Diagnostic Agents] Hives  . Amoxicillin Itching  . Ampicillin Itching  . Cortisol [Hydrocortisone] Itching  . Ivp Dye [Iodinated Diagnostic Agents] Hives  . Levaquin [Levofloxacin] Itching and Rash  . Penicillins Rash    "haven't had it in years"  . Vancomycin Itching and Rash   Past Medical History:  Diagnosis Date  . AAA (abdominal aortic aneurysm) ([place])   . Allergy   . Anxiety   . Bladder cancer ([place]) dx'd 2011   Chronic microscopic hematuria; transitional cell cancer  . Blood transfusion 1960   "related to hysterectomy"  . Chronic atrial fibrillation ([facility])    Anticoagulated with warfarin, rate control with diltiazem and Toprol  . Chronic back pain   . Chronic  headache    "probably 2/wk" (08/28/2015)  . Concussion w/o coma 9/70/2637   Complicated by subarachnoid hemorrhage.  "even now has times when she's not able to comprehend" (05/08/12)  . Coronary artery disease    Nonobstructive  . Diverticulitis   . Diverticulosis   . DVT (deep venous thrombosis), right 2005   "right calf after 8 foot fall"  . Edema of both legs    Chronic, thought to be secondary to DVTs  . External hemorrhoids   . Family history of adverse reaction to anesthesia 2012   daughter "had OR for crushed hand; had problems w/anesthesia & I was in there all day long" (  . Fatty liver   . Frequent UTI   . GERD (gastroesophageal reflux disease)   . H/O hiatal hernia   . High cholesterol   . Migraine    "rare now" (08/28/2015)  . Pneumonia ~ 2010;  2013; 07/2015  . Rheumatoid arthritis(714.0)    "hands" (08/28/2015)  . Urinary frequency     Current Outpatient Medications:  .  atorvastatin (LIPITOR) 10 MG tablet, Take 10 mg by mouth daily., Disp: , Rfl:  .  calcium carbonate (OS-CAL - DOSED IN MG OF ELEMENTAL CALCIUM) 1250 MG tablet, Take 1 tablet by mouth every morning. , Disp: , Rfl:  .  cholecalciferol (VITAMIN D) 1000 UNITS tablet, Take 1,000 Units by mouth daily., Disp: , Rfl:  .  diclofenac Sodium (VOLTAREN) 1 %  GEL, Apply topically as needed. , Disp: , Rfl:  .  DILT-XR 240 MG 24 hr capsule, TAKE 1 CAPSULE BY MOUTH EVERY MORNING ON AN EMPTY STOMACH, Disp: 90 capsule, Rfl: 5 .  furosemide (LASIX) 20 MG tablet, Take 1 tablet (20 mg total) by mouth daily. (Patient taking differently: Take 20-40 mg by mouth daily. Take one tablet (20MG) by mouth daily except on Mon, Wed, Friday take 2 tablets (40 MG)), Disp: 30 tablet, Rfl: 0 .  lidocaine (LIDODERM) 5 %, Place 1 patch onto the skin daily. (knee). Remove & Discard patch within 12 hours or as directed by MD, Disp: 30 patch, Rfl: 0 .  loratadine (CLARITIN) 10 MG tablet, TAKE 1 TABLET BY MOUTH ONCE DAILY FOR ALLERGY, Disp: , Rfl:    .  Multiple Vitamins-Minerals (MULTIVITAMINS THER. W/MINERALS) TABS, Take 1 tablet by mouth daily. , Disp: , Rfl:  .  Multiple Vitamins-Minerals (PRESERVISION AREDS 2+MULTI VIT PO), Take by mouth., Disp: , Rfl:  .  olopatadine (PATANOL) 0.1 % ophthalmic solution, 1 drop 2 (two) times daily., Disp: , Rfl:  .  Omega-3 Fatty Acids (FISH OIL) 1000 MG CAPS, Take 1,000 mg by mouth daily., Disp: , Rfl:  .  omeprazole (PRILOSEC) 20 MG capsule, TAKE 1 CAPSULE BY MOUTH EVERY DAY, Disp: 90 capsule, Rfl: 3 .  PROAIR HFA 108 (90 Base) MCG/ACT inhaler, TAKE 2 PUFFS BY MOUTH EVERY 6 HOURS AS NEEDED FOR WHEEZE OR SHORTNESS OF BREATH, Disp: 8.5 Inhaler, Rfl: 4 .  warfarin (COUMADIN) 3 MG tablet, Take 2 tablets (26m) on Mondays, Wednesdays, Fridays, and Saturdays and 1 tablet (341m on Tuesdays, Thursdays, and Sundays, Disp: 60 tablet, Rfl: 3 Social History   Socioeconomic History  . Marital status: Widowed    Spouse name: Not on file  . Number of children: 3  . Years of education: Not on file  . Highest education level: 8th grade  Occupational History  . Occupation: Retired  Tobacco Use  . Smoking status: Former Smoker    Packs/day: 1.00    Years: 40.00    Pack years: 40.00    Types: Cigarettes    Quit date: 11/07/1982    Years since quitting: 37.9  . Smokeless tobacco: Former [employer] developer  Types: Snuff  Vaping Use  . Vaping Use: Never used  Substance and Sexual Activity  . Alcohol use: Yes    Alcohol/week: 7.0 standard drinks    Types: 7 Glasses of wine per week    Comment: 1 glass of wine prior to bed  . Drug use: No  . Sexual activity: Not Currently    Birth control/protection: Surgical  Other Topics Concern  . Not on file  Social History Narrative   ** Merged History Encounter **       She is a widowed mother of 3,30grandmother of 5,44great grandmother of 5.58She does not   really get routine exercise. She quit smoking in 1983. She does not smoke and does not use illicit drugs and does not  drink.   Social Determinants of Health   Financial Resource Strain:   . Difficulty of Paying Living Expenses: Not on file  Food Insecurity:   . Worried About RuCharity fundraisern the Last Year: Not on file  . Ran Out of Food in the Last Year: Not on file  Transportation Needs:   . Lack of Transportation (Medical): Not on file  . Lack of Transportation (Non-Medical): Not on file  Physical Activity:   .  Days of Exercise per Week: Not on file  . Minutes of Exercise per Session: Not on file  Stress:   . Feeling of Stress : Not on file  Social Connections:   . Frequency of Communication with Friends and Family: Not on file  . Frequency of Social Gatherings with Friends and Family: Not on file  . Attends Religious Services: Not on file  . Active Member of Clubs or Organizations: Not on file  . Attends Archivist Meetings: Not on file  . Marital Status: Not on file  Intimate Partner Violence:   . Fear of Current or Ex-Partner: Not on file  . Emotionally Abused: Not on file  . Physically Abused: Not on file  . Sexually Abused: Not on file   Family History  Problem Relation Age of Onset  . Anesthesia problems Daughter   . Cancer Mother        uterine  . Cancer Sister        leukemia  . Cancer Brother   . Prostate cancer Brother   . Cancer Brother   . Breast cancer Other        neice  . Heart disease Brother   . Cancer Brother   . Heart attack Sister   . Colon cancer Neg Hx     Objective: Office vital signs reviewed. BP 136/79   Pulse 83   Temp (!) 97.3 F (36.3 C)   Ht $R'5\' 5"'IA$  (1.651 m)   Wt 153 lb (69.4 kg)   SpO2 97%   BMI 25.46 kg/m   Physical Examination:  General: Awake, alert,  well appearing elderly female. No acute distress HEENT: Sclera white.  MMM, no conjunctival pallor Cardio: Irregularly irregular.  No murmurs Pulm: Clear to auscultation bilaterally.  No wheezes, rhonchi or rales.  Normal work of breathing on room air. MSK: Ambulating  with rolling walker.  Tone is fair.  She has chronic degenerative changes noted in the right knee.  No gross soft tissue swelling appreciated  Assessment/ Plan: 86 y.o. female   Chronic atrial fibrillation ([place]) - Plan: CoaguChek XS/INR Waived, Hemoglobin, fingerstick, CMP14+EGFR, POCT INR  Chronic anticoagulation - Plan: CoaguChek XS/INR Waived, Hemoglobin, fingerstick, CMP14+EGFR, POCT INR  Bloody stool  Chronic pain of right knee - Plan: Ambulatory referral to Orthopedic Surgery, POCT INR  Need for immunization against influenza - Plan: Flu Vaccine QUAD High Dose(Fluad)  INR is therapeutic.  I did have some concern with the scant blood in stool that she saw.  This is a likely the result of straining and/or hemorrhoid.  However, we discussed that this needs to be kept a close eye on as if there is an ongoing GI bleed it certainly needs to be addressed.  She had no signs or symptoms suggestive of anemia on today's exam.  She may continue current regimen.    I have placed a referral to a different orthopedist for second opinion.  Given her age unsure how much more intervention can be done for this patient.  However, she was insistent since the physical therapy worsened her symptoms.  Her influenza vaccination was administered.  She will follow-up in 6 weeks, sooner if needed  Orders Placed This Encounter  Procedures  . CoaguChek XS/INR Waived  . Hemoglobin, fingerstick    Standing Status:   Future    Standing Expiration Date:   10/08/2021  . CMP14+EGFR    Standing Status:   Future    Standing Expiration Date:   10/08/2021  No orders of the defined types were placed in this encounter.    Janora Norlander, DO [place] [phone]

## 2020-10-08 NOTE — Patient Instructions (Signed)
Referral placed to Dr Almyra Deforest.  I believe he is in the same office as Dr Alvan Dame.  Plan for labs next visit.

## 2020-10-13 NOTE — Addendum Note (Signed)
Addended by: Janora Norlander on: 10/13/2020 07:27 AM   Modules accepted: Orders

## 2020-10-20 ENCOUNTER — Other Ambulatory Visit: Payer: Self-pay | Admitting: Family Medicine

## 2020-10-22 ENCOUNTER — Ambulatory Visit: Payer: PPO | Admitting: Orthopaedic Surgery

## 2020-10-23 DIAGNOSIS — N3941 Urge incontinence: Secondary | ICD-10-CM | POA: Diagnosis not present

## 2020-10-23 DIAGNOSIS — Z8551 Personal history of malignant neoplasm of bladder: Secondary | ICD-10-CM | POA: Diagnosis not present

## 2020-10-23 DIAGNOSIS — R300 Dysuria: Secondary | ICD-10-CM | POA: Diagnosis not present

## 2020-10-23 DIAGNOSIS — N952 Postmenopausal atrophic vaginitis: Secondary | ICD-10-CM | POA: Diagnosis not present

## 2020-10-27 ENCOUNTER — Ambulatory Visit: Payer: PPO | Admitting: Orthopaedic Surgery

## 2020-11-19 ENCOUNTER — Ambulatory Visit: Payer: Self-pay

## 2020-11-19 ENCOUNTER — Ambulatory Visit (INDEPENDENT_AMBULATORY_CARE_PROVIDER_SITE_OTHER): Payer: PPO | Admitting: Orthopaedic Surgery

## 2020-11-19 ENCOUNTER — Encounter: Payer: Self-pay | Admitting: Orthopaedic Surgery

## 2020-11-19 ENCOUNTER — Other Ambulatory Visit: Payer: Self-pay

## 2020-11-19 DIAGNOSIS — M25561 Pain in right knee: Secondary | ICD-10-CM | POA: Diagnosis not present

## 2020-11-19 DIAGNOSIS — G8929 Other chronic pain: Secondary | ICD-10-CM

## 2020-11-19 NOTE — Progress Notes (Signed)
Office Visit Note   Patient: [name]           Date of Birth: 1921-09-26           MRN: [id number] Visit Date: 11/19/2020              Requested by: Janora Norlander, DO [place],  [place] [id number] PCP: Janora Norlander, DO   Assessment & Plan: Visit Diagnoses:  1. Chronic pain of right knee     Plan: Given the pain that she is having, there is concerned about the possibility of prosthetic loosening.  At this point a three-phase bone scan is warranted to further assess for prosthetic loosening.  I am a little concerned about her pain along the lateral joint line and what I am seeing radiographically on plain films that may correlate with pain in this area.  We will see her back after the bone scan.  All question concerns were answered and addressed.  Follow-Up Instructions: No follow-ups on file.   Orders:  Orders Placed This Encounter  Procedures  . XR Knee 1-2 Views Right   No orders of the defined types were placed in this encounter.     Procedures: No procedures performed   Clinical Data: No additional findings.   Subjective: Chief Complaint  Patient presents with  . Right Knee - Pain  The patient is a 86 year old female who had a right knee replacement at age 26 just before turning 23.  This was done by one of my colleagues in town.  She has been hurting for some time now with the right knee and she points to the lateral joint line as a source of her pain.  She ambulates with a walker.  She does not drive now.  She came in today for second opinion as a relates to her knee pain.  She has had no other acute change in her medical status.  She is on Coumadin according to the records.  Family members with her as well today.  She is frustrated that the pain in her knee.  HPI  Review of Systems She currently denies any headache, chest pain, shortness of breath, fever, chills, nausea, vomiting  Objective: Vital Signs: There were no vitals  taken for this visit.  Physical Exam She is alert and orient x3 and in no acute distress Ortho Exam Examination of her right knee does show tenderness along the lateral joint line.  There is no effusion.  The patella seems to track well.  Her knee is cool.  The incision is well-healed.  The knee does not feel unstable on my exam ligamentously. Specialty Comments:  No specialty comments available.  Imaging: XR Knee 1-2 Views Right  Result Date: 11/19/2020 2 views of the right knee show total knee arthroplasty that appears in good alignment with no gross findings.  There is no left knee joint effusion.  There is lucency along the lateral tibial plateau that is concerning.  This is aware the patient also hurts.    PMFS History: Patient Active Problem List   Diagnosis Date Noted  . Precordial pain 08/30/2018  . Reactive airway disease with acute exacerbation 01/28/2018  . Left ventricular diastolic dysfunction, NYHA class 2 12/21/2013  . Non-rheumatic aortic sclerosis 12/21/2013  . AAA (abdominal aortic aneurysm) ([place]) 12/21/2013  . Abdominal aortic aneurysm ([place]) 12/21/2013  . 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 Permanent atrial fibrillation ([place])   . Edema of both legs   .  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 Chronic anticoagulation 05/10/2012  . PVD, moderate bilat carotid disease 05/10/2012  . Dyslipidemia 05/10/2012  . Essential hypertension   . GERD (gastroesophageal reflux disease)   . Bladder cancer ([place])   . Rheumatoid arthritis(714.0)   . Primary bladder malignant neoplasm ([place]) 11/10/2011  . COLONIC POLYPS, ADENOMATOUS 01/26/2008  . HEMORRHOIDS, EXTERNAL 01/26/2008  . GASTRITIS, CHRONIC 01/26/2008  . HIATAL HERNIA 01/26/2008  . DIVERTICULOSIS OF COLON 01/26/2008  . Atrophic gastritis 01/26/2008   Past Medical History:  Diagnosis Date  . AAA (abdominal aortic aneurysm) ([place])   . Allergy   . Anxiety   . Bladder cancer ([place]) dx'd 2011   Chronic microscopic hematuria;  transitional cell cancer  . Blood transfusion 1960   "related to hysterectomy"  . Chronic atrial fibrillation (HCC)    Anticoagulated with warfarin, rate control with diltiazem and Toprol  . Chronic back pain   . Chronic headache    "probably 2/wk" (08/28/2015)  . Concussion w/o coma 7/68/0881   Complicated by subarachnoid hemorrhage.  "even now has times when she's not able to comprehend" (05/08/12)  . Coronary artery disease    Nonobstructive  . Diverticulitis   . Diverticulosis   . DVT (deep venous thrombosis), right 2005   "right calf after 8 foot fall"  . Edema of both legs    Chronic, thought to be secondary to DVTs  . External hemorrhoids   . Family history of adverse reaction to anesthesia 2012   daughter "had OR for crushed hand; had problems w/anesthesia & I was in there all day long" (  . Fatty liver   . Frequent UTI   . GERD (gastroesophageal reflux disease)   . H/O hiatal hernia   . High cholesterol   . Migraine    "rare now" (08/28/2015)  . Pneumonia ~ 2010;  2013; 07/2015  . Rheumatoid arthritis(714.0)    "hands" (08/28/2015)  . Urinary frequency     Family History  Problem Relation Age of Onset  . Anesthesia problems Daughter   . Cancer Mother        uterine  . Cancer Sister        leukemia  . Cancer Brother   . Prostate cancer Brother   . Cancer Brother   . Breast cancer Other        neice  . Heart disease Brother   . Cancer Brother   . Heart attack Sister   . Colon cancer Neg Hx     Past Surgical History:  Procedure Laterality Date  . BREAST CYST EXCISION Left 1960's?   2 cysts; benign  . CARDIAC CATHETERIZATION  1980's  . CATARACT EXTRACTION W/ INTRAOCULAR LENS  IMPLANT, BILATERAL Bilateral 1992  . CYSTOSCOPY  11/11/2011   Procedure: CYSTOSCOPY;  Surgeon: Malka So;  Location: [facility];  Service: Urology;  Laterality: N/A;  . CYSTOSTOMY W/ BLADDER BIOPSY  10/08/2013  . INCONTINENCE SURGERY  1980's  . JOINT REPLACEMENT    . KNEE ARTHROSCOPY  Right 2005   S/P fall  . Lower Extremity Venous Doppler  11/10/2013   No DVT or superficial thrombus enlarged inguinal lymph node noted in the right. No Baker's cyst.  . TONSILLECTOMY  1952  . TOTAL KNEE ARTHROPLASTY Right 02/05/2013   Procedure: TOTAL KNEE ARTHROPLASTY;  Surgeon: Mauri Pole, MD;  Location: [facility];  Service: Orthopedics;  Laterality: Right;  . TRANSTHORACIC ECHOCARDIOGRAM  01/'15; 4/'19   a) EF 60-65%. NO RWMA. Ao Sclerosis.  MAC - no MS with mild MR>  B Atriae mildly dilated;; b)  EF 55-60%. AoV Sclerosis (no stenosis).  Trivial AI & MR. Mild LAE & Severe RAE.. Mild RV dilation  . TRANSURETHRAL RESECTION OF BLADDER TUMOR  11/11/2011   Procedure: TRANSURETHRAL RESECTION OF BLADDER TUMOR (TURBT);  Surgeon: Malka So;  Location: [facility];  Service: Urology;  Laterality: N/A;  Cysto, Bladder Biopsy, TURBT with [place],   . TRANSURETHRAL RESECTION OF BLADDER TUMOR WITH GYRUS (TURBT-GYRUS)  2007; 2009; 2010   "for tumors on surface of bladder"  . TUMOR EXCISION  1976; 1980's   "fatty tumor cut off her upper back; left thumb"  . VAGINAL HYSTERECTOMY  1960   partial    Social History   Occupational History  . Occupation: Retired  Tobacco Use  . Smoking status: Former Smoker    Packs/day: 1.00    Years: 40.00    Pack years: 40.00    Types: Cigarettes    Quit date: 11/07/1982    Years since quitting: 38.0  . Smokeless tobacco: Former Systems developer    Types: Snuff  Vaping Use  . Vaping Use: Never used  Substance and Sexual Activity  . Alcohol use: Yes    Alcohol/week: 7.0 standard drinks    Types: 7 Glasses of wine per week    Comment: 1 glass of wine prior to bed  . Drug use: No  . Sexual activity: Not Currently    Birth control/protection: Surgical

## 2020-11-19 NOTE — Addendum Note (Signed)
Addended by: Robyne Peers on: 11/19/2020 04:58 PM   Modules accepted: Orders

## 2020-12-01 ENCOUNTER — Encounter (HOSPITAL_COMMUNITY): Payer: PPO

## 2020-12-02 ENCOUNTER — Encounter: Payer: Self-pay | Admitting: Family

## 2020-12-02 ENCOUNTER — Ambulatory Visit (INDEPENDENT_AMBULATORY_CARE_PROVIDER_SITE_OTHER): Payer: PPO | Admitting: Family

## 2020-12-02 ENCOUNTER — Other Ambulatory Visit: Payer: Self-pay

## 2020-12-02 VITALS — BP 145/71 | HR 97 | Temp 97.2°F | Ht 65.0 in | Wt 153.0 lb

## 2020-12-02 DIAGNOSIS — I482 Chronic atrial fibrillation, unspecified: Secondary | ICD-10-CM

## 2020-12-02 DIAGNOSIS — I4821 Permanent atrial fibrillation: Secondary | ICD-10-CM | POA: Diagnosis not present

## 2020-12-02 LAB — COAGUCHEK XS/INR WAIVED
INR: 3.4 — ABNORMAL HIGH (ref 0.9–1.1)
Prothrombin Time: 40.2 s

## 2020-12-02 NOTE — Progress Notes (Signed)
   Subjective:    Patient ID: [name], female    DOB: [birth year], 86 y.o.   MRN: [id number]  Chief Complaint  Patient presents with  . Coagulation Disorder    HPI  Pt presents to the office today for INR check. She is currently taking warfarin for  A Fib.   She denies any missed doses, extra doses, changes in medications or diet.   She is currently taking 3 mg every except Monday, Wed, and Friday she is taking 6 mg.   Her INR today is 3.4. See anticoagulation flow sheet.   Review of Systems  All other systems reviewed and are negative.      Objective:   Physical Exam Vitals reviewed.  Constitutional:      General: She is not in acute distress.    Appearance: She is well-developed and well-nourished.  HENT:     Head: Normocephalic and atraumatic.     Mouth/Throat:     Mouth: Oropharynx is clear and moist.  Eyes:     Pupils: Pupils are equal, round, and reactive to light.  Neck:     Thyroid: No thyromegaly.  Cardiovascular:     Rate and Rhythm: Tachycardia present. Rhythm irregular.     Pulses: Intact distal pulses.     Heart sounds: Normal heart sounds. No murmur heard.   Pulmonary:     Effort: Pulmonary effort is normal. No respiratory distress.     Breath sounds: Normal breath sounds. No wheezing.  Abdominal:     General: Bowel sounds are normal. There is no distension.     Palpations: Abdomen is soft.     Tenderness: There is no abdominal tenderness.  Musculoskeletal:        General: No tenderness or edema. Normal range of motion.     Cervical back: Normal range of motion and neck supple.  Skin:    General: Skin is warm and dry.  Neurological:     Mental Status: She is alert and oriented to person, place, and time.     Cranial Nerves: No cranial nerve deficit.     Deep Tendon Reflexes: Reflexes are normal and symmetric.  Psychiatric:        Mood and Affect: Mood and affect normal.        Behavior: Behavior normal.        Thought Content:  Thought content normal.        Judgment: Judgment normal.       BP (!) 170/74   Pulse (!) 117   Temp (!) 97.2 F (36.2 C) (Temporal)   Ht 5\' 5"  (1.651 m)   Wt 153 lb (69.4 kg)   BMI 25.46 kg/m      Assessment & Plan:  1. Chronic atrial fibrillation (HCC) Description   INR 3.4 (Too thin) Goal- 2-3  Hold today's dose (12/03/19), Then decrease 6 mg to Monday and Friday, then 3 mg all other days.      - CoaguChek XS/INR Waived  2. Permanent atrial fibrillation ([place])    Evelina Dun, FNP

## 2020-12-16 ENCOUNTER — Telehealth: Payer: Self-pay

## 2020-12-16 ENCOUNTER — Ambulatory Visit: Payer: PPO | Admitting: Family Medicine

## 2020-12-16 NOTE — Telephone Encounter (Signed)
Pt scheduled with Dr Reece Agar tomorrow at 1:00.

## 2020-12-17 ENCOUNTER — Other Ambulatory Visit: Payer: Self-pay

## 2020-12-17 ENCOUNTER — Encounter: Payer: Self-pay | Admitting: Family Medicine

## 2020-12-17 ENCOUNTER — Ambulatory Visit (INDEPENDENT_AMBULATORY_CARE_PROVIDER_SITE_OTHER): Payer: PPO | Admitting: Family Medicine

## 2020-12-17 VITALS — BP 140/82 | HR 97 | Temp 97.3°F | Ht 65.0 in | Wt 154.0 lb

## 2020-12-17 DIAGNOSIS — R791 Abnormal coagulation profile: Secondary | ICD-10-CM | POA: Diagnosis not present

## 2020-12-17 DIAGNOSIS — Z7901 Long term (current) use of anticoagulants: Secondary | ICD-10-CM

## 2020-12-17 DIAGNOSIS — I482 Chronic atrial fibrillation, unspecified: Secondary | ICD-10-CM | POA: Diagnosis not present

## 2020-12-17 LAB — COAGUCHEK XS/INR WAIVED
INR: 3.1 — ABNORMAL HIGH (ref 0.9–1.1)
Prothrombin Time: 37.3 s

## 2020-12-17 LAB — POCT INR: INR: 3.1 — AB (ref 2–3)

## 2020-12-17 NOTE — Patient Instructions (Addendum)
Go down to 1 tablet daily except for Mondays and Fridays you can continue 2 tablets.  Your blood was a little thin today at 3.1.  Your goal is 2-3.  I collected a few other labs to check on your kidney function, liver enzymes and to look for any anemia today as well.  We will have this back in the next couple of days  See me back in 2 weeks on 1/19 at 11:15am for repeat INR check  Make sure that you bring me the medications that Dr. Annabell Howells has prescribed you that day as well

## 2020-12-17 NOTE — Progress Notes (Signed)
Subjective: CC: INR follow-up PCP: Janora Norlander, DO HER:DEYCXKG [name] is a 87 y.o. female presenting to clinic today for:  1.  A. Fib Patient notes that she has been taking 6 mg Mondays, Wednesdays and Fridays with 3 mg daily all other days. She only stopped taking 6 mg for 1 day since her last visit as she thought she was supposed to resume her normal daily dosing. She denies any heart palpitations, falls. She did have one episode of scant bleeding rectally but she thinks she wiped her rectum too hard after stooling. She is had no recurrent issues. Denies any lightheadedness.   ROS: Per HPI  Allergies  Allergen Reactions  . Contrast Media [Iodinated Diagnostic Agents] Hives  . Amoxicillin Itching  . Ampicillin Itching  . Cortisol [Hydrocortisone] Itching  . Ivp Dye [Iodinated Diagnostic Agents] Hives  . Levaquin [Levofloxacin] Itching and Rash  . Penicillins Rash    "haven't had it in years"  . Vancomycin Itching and Rash   Past Medical History:  Diagnosis Date  . AAA (abdominal aortic aneurysm) ([place])   . Allergy   . Anxiety   . Bladder cancer ([facility]) dx'd 2011   Chronic microscopic hematuria; transitional cell cancer  . Blood transfusion 1960   "related to hysterectomy"  . Chronic atrial fibrillation (HCC)    Anticoagulated with warfarin, rate control with diltiazem and Toprol  . Chronic back pain   . Chronic headache    "probably 2/wk" (08/28/2015)  . Concussion w/o coma 07/30/5630   Complicated by subarachnoid hemorrhage.  "even now has times when she's not able to comprehend" (05/08/12)  . Coronary artery disease    Nonobstructive  . Diverticulitis   . Diverticulosis   . DVT (deep venous thrombosis), right 2005   "right calf after 8 foot fall"  . Edema of both legs    Chronic, thought to be secondary to DVTs  . External hemorrhoids   . Family history of adverse reaction to anesthesia 2012   daughter "had OR for crushed hand; had problems w/anesthesia  & I was in there all day long" (  . Fatty liver   . Frequent UTI   . GERD (gastroesophageal reflux disease)   . H/O hiatal hernia   . High cholesterol   . Migraine    "rare now" (08/28/2015)  . Pneumonia ~ 2010;  2013; 07/2015  . Rheumatoid arthritis(714.0)    "hands" (08/28/2015)  . Urinary frequency     Current Outpatient Medications:  .  atorvastatin (LIPITOR) 10 MG tablet, Take 10 mg by mouth daily., Disp: , Rfl:  .  calcium carbonate (OS-CAL - DOSED IN MG OF ELEMENTAL CALCIUM) 1250 MG tablet, Take 1 tablet by mouth every morning. , Disp: , Rfl:  .  cholecalciferol (VITAMIN D) 1000 UNITS tablet, Take 1,000 Units by mouth daily., Disp: , Rfl:  .  diclofenac Sodium (VOLTAREN) 1 % GEL, Apply topically as needed. , Disp: , Rfl:  .  DILT-XR 240 MG 24 hr capsule, TAKE 1 CAPSULE BY MOUTH EVERY MORNING ON AN EMPTY STOMACH, Disp: 90 capsule, Rfl: 5 .  furosemide (LASIX) 20 MG tablet, Take 1 tablet (20 mg total) by mouth daily. (Patient taking differently: Take 20-40 mg by mouth daily. Take one tablet (20MG) by mouth daily except on Mon, Wed, Friday take 2 tablets (40 MG)), Disp: 30 tablet, Rfl: 0 .  lidocaine (LIDODERM) 5 %, Place 1 patch onto the skin daily. (knee). Remove & Discard patch within 12  hours or as directed by MD, Disp: 30 patch, Rfl: 0 .  loratadine (CLARITIN) 10 MG tablet, TAKE 1 TABLET BY MOUTH ONCE DAILY FOR ALLERGY, Disp: , Rfl:  .  Multiple Vitamins-Minerals (MULTIVITAMINS THER. W/MINERALS) TABS, Take 1 tablet by mouth daily. , Disp: , Rfl:  .  Multiple Vitamins-Minerals (PRESERVISION AREDS 2+MULTI VIT PO), Take by mouth., Disp: , Rfl:  .  olopatadine (PATANOL) 0.1 % ophthalmic solution, 1 drop 2 (two) times daily., Disp: , Rfl:  .  Omega-3 Fatty Acids (FISH OIL) 1000 MG CAPS, Take 1,000 mg by mouth daily., Disp: , Rfl:  .  omeprazole (PRILOSEC) 20 MG capsule, TAKE 1 CAPSULE BY MOUTH EVERY DAY, Disp: 90 capsule, Rfl: 3 .  PROAIR HFA 108 (90 Base) MCG/ACT inhaler, TAKE 2 PUFFS  BY MOUTH EVERY 6 HOURS AS NEEDED FOR WHEEZE OR SHORTNESS OF BREATH, Disp: 8.5 Inhaler, Rfl: 4 .  warfarin (COUMADIN) 3 MG tablet, TAKE 1 TABLET BY MOUTH EVERY DAY EXCEPT TAKE 2 ON MONDAY, WEDNESDAY, FRIDAY, AND SATURDAY, Disp: 150 tablet, Rfl: 1 Social History   Socioeconomic History  . Marital status: Widowed    Spouse name: Not on file  . Number of children: 3  . Years of education: Not on file  . Highest education level: 8th grade  Occupational History  . Occupation: Retired  Tobacco Use  . Smoking status: Former Smoker    Packs/day: 1.00    Years: 40.00    Pack years: 40.00    Types: Cigarettes    Quit date: 11/07/1982    Years since quitting: 38.1  . Smokeless tobacco: Former Systems developer    Types: Snuff  Vaping Use  . Vaping Use: Never used  Substance and Sexual Activity  . Alcohol use: Yes    Alcohol/week: 7.0 standard drinks    Types: 7 Glasses of wine per week    Comment: 1 glass of wine prior to bed  . Drug use: No  . Sexual activity: Not Currently    Birth control/protection: Surgical  Other Topics Concern  . Not on file  Social History Narrative   ** Merged History Encounter **       She is a widowed mother of 46, grandmother of 69, great grandmother of 60. She does not   really get routine exercise. She quit smoking in 1983. She does not smoke and does not use illicit drugs and does not drink.   Social Determinants of Health   Financial Resource Strain: Not on file  Food Insecurity: Not on file  Transportation Needs: Not on file  Physical Activity: Not on file  Stress: Not on file  Social Connections: Not on file  Intimate Partner Violence: Not on file   Family History  Problem Relation Age of Onset  . Anesthesia problems Daughter   . Cancer Mother        uterine  . Cancer Sister        leukemia  . Cancer Brother   . Prostate cancer Brother   . Cancer Brother   . Breast cancer Other        neice  . Heart disease Brother   . Cancer Brother   . Heart  attack Sister   . Colon cancer Neg Hx     Objective: Office vital signs reviewed. BP 140/82   Pulse 97   Temp (!) 97.3 F (36.3 C) (Temporal)   Ht 5' 5"  (1.651 m)   Wt 154 lb (69.9 kg)   SpO2 97%  BMI 25.63 kg/m   Physical Examination:  General: Awake, alert,well nourished, No acute distress HEENT: Normal; sclera white.  Moist mucous membranes Cardio: Irregularly irregular with rate control.  S1-S2 heard.  No appreciable murmurs Pulm: clear to auscultation bilaterally, no wheezes, rhonchi or rales; normal work of breathing on room air MSK: Utilizing rolling walker for ambulation Assessment/ Plan: 87 y.o. female   Supratherapeutic INR - Plan: POCT INR  Chronic atrial fibrillation ([place]) - Plan: CoaguChek XS/INR Waived, CBC, CMP14+EGFR, TSH, POCT INR  Chronic anticoagulation - Plan: CoaguChek XS/INR Waived, CBC, POCT INR  INR is slightly supratherapeutic at 3.1.  After further discussion it appears that she did not in fact reduce her 6 mg dose to 2 days/week and has resumed 3x week 6 mg dosing.  I have again reinforced just 2 days/week of 6 mg with 3 mg daily all other days.  This was reiterated on her AVS and verbally with her.  We will see her back in 2 weeks for recheck.  No orders of the defined types were placed in this encounter.  No orders of the defined types were placed in this encounter.    Janora Norlander, DO [place] [phone]

## 2020-12-18 LAB — CMP14+EGFR
ALT: 16 IU/L (ref 0–32)
AST: 19 IU/L (ref 0–40)
Albumin/Globulin Ratio: 1.8 (ref 1.2–2.2)
Albumin: 4.3 g/dL (ref 3.5–4.6)
Alkaline Phosphatase: 102 IU/L (ref 44–121)
BUN/Creatinine Ratio: 15 (ref 12–28)
BUN: 11 mg/dL (ref 10–36)
Bilirubin Total: 0.4 mg/dL (ref 0.0–1.2)
CO2: 25 mmol/L (ref 20–29)
Calcium: 9.2 mg/dL (ref 8.7–10.3)
Chloride: 97 mmol/L (ref 96–106)
Creatinine, Ser: 0.71 mg/dL (ref 0.57–1.00)
GFR calc Af Amer: 83 mL/min/{1.73_m2} (ref >59)
GFR calc non Af Amer: 72 mL/min/{1.73_m2} (ref >59)
Globulin, Total: 2.4 g/dL (ref 1.5–4.5)
Glucose: 84 mg/dL (ref 65–99)
Potassium: 3.7 mmol/L (ref 3.5–5.2)
Sodium: 136 mmol/L (ref 134–144)
Total Protein: 6.7 g/dL (ref 6.0–8.5)

## 2020-12-18 LAB — CBC
Hematocrit: 39.9 % (ref 34.0–46.6)
Hemoglobin: 13.3 g/dL (ref 11.1–15.9)
MCH: 32 pg (ref 26.6–33.0)
MCHC: 33.3 g/dL (ref 31.5–35.7)
MCV: 96 fL (ref 79–97)
Platelets: 268 10*3/uL (ref 150–450)
RBC: 4.15 x10E6/uL (ref 3.77–5.28)
RDW: 11.9 % (ref 11.7–15.4)
WBC: 8 10*3/uL (ref 3.4–10.8)

## 2020-12-18 LAB — TSH: TSH: 4.45 u[IU]/mL (ref 0.450–4.500)

## 2020-12-19 DIAGNOSIS — N952 Postmenopausal atrophic vaginitis: Secondary | ICD-10-CM | POA: Diagnosis not present

## 2020-12-19 DIAGNOSIS — N3941 Urge incontinence: Secondary | ICD-10-CM | POA: Diagnosis not present

## 2020-12-19 DIAGNOSIS — Z8551 Personal history of malignant neoplasm of bladder: Secondary | ICD-10-CM | POA: Diagnosis not present

## 2020-12-22 ENCOUNTER — Telehealth: Payer: Self-pay | Admitting: Orthopaedic Surgery

## 2020-12-22 NOTE — Telephone Encounter (Signed)
Please advise 

## 2020-12-22 NOTE — Telephone Encounter (Signed)
Pts daughter [name] called stating she had some concerns about the pt having a bone scan done; she sates she's not sure the pt can lay down for that time. The pt has an appt for 01/01/21 to have fluid drawn off and to get a Cortizone inj. [name] would like a CB if Dr.Blackman has any concerns or can think of a better, less invasive way to help the pt.   [phone]

## 2020-12-23 NOTE — Telephone Encounter (Signed)
There is really no other study that can be done to rule out prosthetic loosening.  Usually bone scan does not require someone to lay down very long like MRI would require.

## 2020-12-23 NOTE — Telephone Encounter (Signed)
Daughter aware.

## 2020-12-24 ENCOUNTER — Encounter (HOSPITAL_COMMUNITY): Payer: PPO

## 2020-12-31 ENCOUNTER — Ambulatory Visit: Payer: PPO | Admitting: Family Medicine

## 2020-12-31 NOTE — Progress Notes (Deleted)
Subjective: CC:*** PCP: Janora Norlander, DO XBM:[id number] E Hanko is a 87 y.o. female presenting to clinic today for:  1. ***   ROS: Per HPI  Allergies  Allergen Reactions  . Contrast Media [Iodinated Diagnostic Agents] Hives  . Amoxicillin Itching  . Ampicillin Itching  . Cortisol [Hydrocortisone] Itching  . Ivp Dye [Iodinated Diagnostic Agents] Hives  . Levaquin [Levofloxacin] Itching and Rash  . Penicillins Rash    "haven't had it in years"  . Vancomycin Itching and Rash   Past Medical History:  Diagnosis Date  . AAA (abdominal aortic aneurysm) ([place])   . Allergy   . Anxiety   . Bladder cancer ([place]) dx'd 2011   Chronic microscopic hematuria; transitional cell cancer  . Blood transfusion 1960   "related to hysterectomy"  . Chronic atrial fibrillation (HCC)    Anticoagulated with warfarin, rate control with diltiazem and Toprol  . Chronic back pain   . Chronic headache    "probably 2/wk" (08/28/2015)  . Concussion w/o coma 0/09/2724   Complicated by subarachnoid hemorrhage.  "even now has times when she's not able to comprehend" (05/08/12)  . Coronary artery disease    Nonobstructive  . Diverticulitis   . Diverticulosis   . DVT (deep venous thrombosis), right 2005   "right calf after 8 foot fall"  . Edema of both legs    Chronic, thought to be secondary to DVTs  . External hemorrhoids   . Family history of adverse reaction to anesthesia 2012   daughter "had OR for crushed hand; had problems w/anesthesia & I was in there all day long" (  . Fatty liver   . Frequent UTI   . GERD (gastroesophageal reflux disease)   . H/O hiatal hernia   . High cholesterol   . Migraine    "rare now" (08/28/2015)  . Pneumonia ~ 2010;  2013; 07/2015  . Rheumatoid arthritis(714.0)    "hands" (08/28/2015)  . Urinary frequency     Current Outpatient Medications:  .  atorvastatin (LIPITOR) 10 MG tablet, Take 10 mg by mouth daily., Disp: , Rfl:  .  calcium carbonate (OS-CAL -  DOSED IN MG OF ELEMENTAL CALCIUM) 1250 MG tablet, Take 1 tablet by mouth every morning. , Disp: , Rfl:  .  cholecalciferol (VITAMIN D) 1000 UNITS tablet, Take 1,000 Units by mouth daily., Disp: , Rfl:  .  diclofenac Sodium (VOLTAREN) 1 % GEL, Apply topically as needed. , Disp: , Rfl:  .  DILT-XR 240 MG 24 hr capsule, TAKE 1 CAPSULE BY MOUTH EVERY MORNING ON AN EMPTY STOMACH, Disp: 90 capsule, Rfl: 5 .  furosemide (LASIX) 20 MG tablet, Take 1 tablet (20 mg total) by mouth daily. (Patient taking differently: Take 20-40 mg by mouth daily. Take one tablet (20MG ) by mouth daily except on Mon, Wed, Friday take 2 tablets (40 MG)), Disp: 30 tablet, Rfl: 0 .  lidocaine (LIDODERM) 5 %, Place 1 patch onto the skin daily. (knee). Remove & Discard patch within 12 hours or as directed by MD, Disp: 30 patch, Rfl: 0 .  loratadine (CLARITIN) 10 MG tablet, TAKE 1 TABLET BY MOUTH ONCE DAILY FOR ALLERGY, Disp: , Rfl:  .  Multiple Vitamins-Minerals (PRESERVISION AREDS 2+MULTI VIT PO), Take by mouth., Disp: , Rfl:  .  olopatadine (PATANOL) 0.1 % ophthalmic solution, 1 drop 2 (two) times daily., Disp: , Rfl:  .  Omega-3 Fatty Acids (FISH OIL) 1000 MG CAPS, Take 1,000 mg by mouth daily., Disp: , Rfl:  .  omeprazole (PRILOSEC) 20 MG capsule, TAKE 1 CAPSULE BY MOUTH EVERY DAY, Disp: 90 capsule, Rfl: 3 .  PROAIR HFA 108 (90 Base) MCG/ACT inhaler, TAKE 2 PUFFS BY MOUTH EVERY 6 HOURS AS NEEDED FOR WHEEZE OR SHORTNESS OF BREATH, Disp: 8.5 Inhaler, Rfl: 4 .  warfarin (COUMADIN) 3 MG tablet, TAKE 1 TABLET BY MOUTH EVERY DAY EXCEPT TAKE 2 ON MONDAY, WEDNESDAY, FRIDAY, AND SATURDAY, Disp: 150 tablet, Rfl: 1 Social History   Socioeconomic History  . Marital status: Widowed    Spouse name: Not on file  . Number of children: 3  . Years of education: Not on file  . Highest education level: 8th grade  Occupational History  . Occupation: Retired  Tobacco Use  . Smoking status: Former Smoker    Packs/day: 1.00    Years: 40.00     Pack years: 40.00    Types: Cigarettes    Quit date: 11/07/1982    Years since quitting: 38.1  . Smokeless tobacco: Former Systems developer    Types: Snuff  Vaping Use  . Vaping Use: Never used  Substance and Sexual Activity  . Alcohol use: Yes    Alcohol/week: 7.0 standard drinks    Types: 7 Glasses of wine per week    Comment: 1 glass of wine prior to bed  . Drug use: No  . Sexual activity: Not Currently    Birth control/protection: Surgical  Other Topics Concern  . Not on file  Social History Narrative   ** Merged History Encounter **       She is a widowed mother of 10, grandmother of 80, great grandmother of 71. She does not   really get routine exercise. She quit smoking in 1983. She does not smoke and does not use illicit drugs and does not drink.   Social Determinants of Health   Financial Resource Strain: Not on file  Food Insecurity: Not on file  Transportation Needs: Not on file  Physical Activity: Not on file  Stress: Not on file  Social Connections: Not on file  Intimate Partner Violence: Not on file   Family History  Problem Relation Age of Onset  . Anesthesia problems Daughter   . Cancer Mother        uterine  . Cancer Sister        leukemia  . Cancer Brother   . Prostate cancer Brother   . Cancer Brother   . Breast cancer Other        neice  . Heart disease Brother   . Cancer Brother   . Heart attack Sister   . Colon cancer Neg Hx     Objective: Office vital signs reviewed. There were no vitals taken for this visit.  Physical Examination:  General: Awake, alert, *** nourished, No acute distress HEENT: Normal    Neck: No masses palpated. No lymphadenopathy    Ears: Tympanic membranes intact, normal light reflex, no erythema, no bulging    Eyes: PERRLA, extraocular membranes intact, sclera ***    Nose: nasal turbinates moist, *** nasal discharge    Throat: moist mucus membranes, no erythema, *** tonsillar exudate.  Airway is patent Cardio: regular rate  and rhythm, S1S2 heard, no murmurs appreciated Pulm: clear to auscultation bilaterally, no wheezes, rhonchi or rales; normal work of breathing on room air GI: soft, non-tender, non-distended, bowel sounds present x4, no hepatomegaly, no splenomegaly, no masses GU: external vaginal tissue ***, cervix ***, *** punctate lesions on cervix appreciated, *** discharge from cervical  os, *** bleeding, *** cervical motion tenderness, *** abdominal/ adnexal masses Extremities: warm, well perfused, No edema, cyanosis or clubbing; +*** pulses bilaterally MSK: *** gait and *** station Skin: dry; intact; no rashes or lesions Neuro: *** Strength and light touch sensation grossly intact, *** DTRs ***/4  Assessment/ Plan: 87 y.o. female   ***  No orders of the defined types were placed in this encounter.  No orders of the defined types were placed in this encounter.    Janora Norlander, DO [place] [phone]

## 2021-01-01 ENCOUNTER — Ambulatory Visit: Payer: PPO | Admitting: Orthopaedic Surgery

## 2021-01-07 ENCOUNTER — Other Ambulatory Visit: Payer: Self-pay

## 2021-01-07 ENCOUNTER — Encounter: Payer: Self-pay | Admitting: Family Medicine

## 2021-01-07 ENCOUNTER — Ambulatory Visit (INDEPENDENT_AMBULATORY_CARE_PROVIDER_SITE_OTHER): Payer: PPO | Admitting: Family Medicine

## 2021-01-07 VITALS — BP 156/86 | HR 108 | Temp 97.5°F | Resp 20 | Ht 65.0 in | Wt 154.0 lb

## 2021-01-07 DIAGNOSIS — R791 Abnormal coagulation profile: Secondary | ICD-10-CM

## 2021-01-07 DIAGNOSIS — I482 Chronic atrial fibrillation, unspecified: Secondary | ICD-10-CM | POA: Diagnosis not present

## 2021-01-07 DIAGNOSIS — E7800 Pure hypercholesterolemia, unspecified: Secondary | ICD-10-CM | POA: Diagnosis not present

## 2021-01-07 LAB — COAGUCHEK XS/INR WAIVED
INR: 3.2 — ABNORMAL HIGH (ref 0.9–1.1)
Prothrombin Time: 38.6 s

## 2021-01-07 LAB — POCT INR: INR: 3.2 — AB (ref 2–3)

## 2021-01-07 MED ORDER — ATORVASTATIN CALCIUM 10 MG PO TABS: 10.0000 mg | ORAL_TABLET | Freq: Every day | ORAL | 0 refills | Status: DC

## 2021-01-07 NOTE — Patient Instructions (Signed)
Vitamin K Foods and Warfarin Warfarin is a blood thinner (anticoagulant). Anticoagulant medicines help prevent the formation of blood clots. Warfarin works by blocking the activity of vitamin K, which promotes normal blood clotting. When you take warfarin, problems can occur from suddenly increasing or decreasing the amount of vitamin K that you eat from one day to the next. These problems can occur due to varying levels of warfarin in your blood. Problems may include:  Blood clots.  Bleeding. What are tips for eating the right amount of vitamin K? To avoid problems when taking warfarin:  Eat a balanced diet that includes: ? Fresh fruits and vegetables. ? Whole grains. ? Low-fat dairy products. ? Lean proteins, such as fish, eggs, and lean cuts of meat.  Keep your intake of vitamin K consistent from day to day. To do this: ? Avoid eating large amounts of vitamin K one day and low amounts of vitamin K the next day. ? If you take a multivitamin that contains vitamin K, be sure to take it every day. ? Know which foods contain vitamin K. Read food labels. Use the lists below to understand serving sizes and the amount of vitamin K in one serving.  Avoid major changes in your diet. If you are going to change your diet, talk with your health care provider before making changes.  Work with a nutrition specialist (dietitian) to develop a meal plan that works best for you.   What foods are high in vitamin K? Foods that are high in vitamin K contain more than 100 mcg (micrograms) per serving. These include:  Broccoli (cooked from fresh) -  cup (78 g) has 110 mcg.  Brussels sprouts (cooked from fresh) -  cup (78 g) has 109 mcg.  Greens, beet (cooked from fresh) -  cup (72 g) has 350 mcg.  Greens, collard (cooked from fresh) -  cup (66 g) has 263 mcg.  Greens, turnip (cooked from fresh) -  cup (72 g) has 265 mcg.  Green onions or scallions -  cup (50 g) has 105 mcg.  Kale (cooked from  fresh) -  cup (68 g) has 536 mcg.  Parsley (raw) - 10 sprigs (10 g) has 164 mcg.  Spinach (cooked from fresh) -  cup (90 g) has 444 mcg.  Swiss chard (cooked from fresh) -  cup (88 g) has 287 mcg.   What foods have a moderate amount of vitamin K? Foods that have a moderate amount of vitamin K contain 25-100 mcg per serving. These include:  Asparagus (cooked from fresh) - 4 spears (60 g) have 30 mcg.  Black-eyed peas (dried) -  cup (85 g) has 32 mcg.  Cabbage (cooked from fresh) -  cup (78 g) has 84 mcg.  Cabbage (raw) -  cup (35 g) has 26 mcg.  Kiwi fruit - 1 medium (69 g) has 27 mcg.  Lettuce (raw) - 1 cup (36 g) has 45 mcg.  Okra (cooked from fresh) -  cup (80 g) has 32 mcg.  Prunes (dried) - 5 prunes (47 g) have 25 mcg.  Watercress (raw) - 1 cup (34 g) has 85 mcg. What foods are low in vitamin K? Foods low in vitamin K contain less than 25 mcg per serving. These include:  Artichoke - 1 medium (128 g) has 18 mcg.  Avocado - 1 oz (21 g) has 6 mcg.  Blueberries -  cup (73 g) has 14 mcg.  Carrots (cooked from fresh) -  cup (  78 g) has 11 mcg.  Cauliflower (raw) -  cup (54 g) has 8 mcg.  Cucumber with peel (raw) -  cup (52 g) has 9 mcg.  Grapes -  cup (76 g) has 12 mcg.  Mango - 1 medium (207 g) has 9 mcg.  Mixed nuts - 1 cup (142 g) has 17 mcg.  Pear - 1 medium (178 g) has 8 mcg.  Peas (cooked from fresh) -  cup (80 g) has 20 mcg.  Pickled cucumber - 1 spear (65 g) has 11 mcg.  Sauerkraut (canned) -  cup (118 g) has 16 mcg.  Soybeans (cooked from fresh) -  cup (86 g) has 16 mcg.  Tomato (raw) - 1 medium (123 g) has 10 mcg.  Tomato sauce (raw) -  cup (123 g) has 17 mcg.   What foods do not have vitamin K? If a food contains less than 5 mcg per serving, it is considered to have no vitamin K. These foods include:  Bread and cereal products.  Cheese.  Eggs.  Fish and shellfish.  Meat and poultry.  Milk and dairy products.  Seeds,  such as sunflower or pumpkin seeds. The items listed above may not be a complete list of foods that have high, moderate, and low amounts of vitamin K or do not have vitamin K. Actual amounts of vitamin K may differ depending on processing. Contact a dietitian for more information. Summary  Warfarin is an anticoagulant that prevents blood clots by blocking the activity of vitamin K. It is important to monitor the content of vitamin K in your foods and to be consistent with the amount of vitamin K you take in each day.  Avoid major changes in your diet. If you are going to change your diet, talk with your health care provider before making changes. This information is not intended to replace advice given to you by your health care provider. Make sure you discuss any questions you have with your health care provider. Document Revised: 09/18/2019 Document Reviewed: 09/18/2019 Elsevier Patient Education  2021 Elsevier Inc.  

## 2021-01-07 NOTE — Progress Notes (Signed)
Established Patient Office Visit  Subjective:  Patient ID: [name], female    DOB: August 28, 1921  Age: 87 y.o. MRN: [id number]  CC:  Chief Complaint  Patient presents with  . Anticoagulation    HPI [name] presents for INR.   Current regimen: 6 mg on mondays and fridays, 3 mg all other days   Indication: chronic A. fib Bleeding Signs/Symptoms:  denies Thromboembolic Signs/Symptoms:  denies   Missed Coumadin Doses: none Medication Changes:  none Dietary Changes:  denies Bacterial/Viral Infection:  denies  Denies palpitations or falls. Last few INRs were too high. Her dose was decreased on 1/5 to the above regimen. She was supposed to follow up in 2 weeks but was unable to due to the snow. She was able to repeat current regimen from memory.   She needs a refill on her statin today.   Past Medical History:  Diagnosis Date  . AAA (abdominal aortic aneurysm) ([place])   . Allergy   . Anxiety   . Bladder cancer ([place]) dx'd 2011   Chronic microscopic hematuria; transitional cell cancer  . Blood transfusion 1960   "related to hysterectomy"  . Chronic atrial fibrillation ([facility])    Anticoagulated with warfarin, rate control with diltiazem and Toprol  . Chronic back pain   . Chronic headache    "probably 2/wk" (08/28/2015)  . Concussion w/o coma 04/22/210   Complicated by subarachnoid hemorrhage.  "even now has times when she's not able to comprehend" (05/08/12)  . Coronary artery disease    Nonobstructive  . Diverticulitis   . Diverticulosis   . DVT (deep venous thrombosis), right 2005   "right calf after 8 foot fall"  . Edema of both legs    Chronic, thought to be secondary to DVTs  . External hemorrhoids   . Family history of adverse reaction to anesthesia 2012   daughter "had OR for crushed hand; had problems w/anesthesia & I was in there all day long" (  . Fatty liver   . Frequent UTI   . GERD (gastroesophageal reflux disease)   . H/O hiatal hernia    . High cholesterol   . Migraine    "rare now" (08/28/2015)  . Pneumonia ~ 2010;  2013; 07/2015  . Rheumatoid arthritis(714.0)    "hands" (08/28/2015)  . Urinary frequency     Past Surgical History:  Procedure Laterality Date  . BREAST CYST EXCISION Left 1960's?   2 cysts; benign  . CARDIAC CATHETERIZATION  1980's  . CATARACT EXTRACTION W/ INTRAOCULAR LENS  IMPLANT, BILATERAL Bilateral 1992  . CYSTOSCOPY  11/11/2011   Procedure: CYSTOSCOPY;  Surgeon: Malka So;  Location: [facility];  Service: Urology;  Laterality: N/A;  . CYSTOSTOMY W/ BLADDER BIOPSY  10/08/2013  . INCONTINENCE SURGERY  1980's  . JOINT REPLACEMENT    . KNEE ARTHROSCOPY Right 2005   S/P fall  . Lower Extremity Venous Doppler  11/10/2013   No DVT or superficial thrombus enlarged inguinal lymph node noted in the right. No Baker's cyst.  . TONSILLECTOMY  1952  . TOTAL KNEE ARTHROPLASTY Right 02/05/2013   Procedure: TOTAL KNEE ARTHROPLASTY;  Surgeon: Mauri Pole, MD;  Location: [facility];  Service: Orthopedics;  Laterality: Right;  . TRANSTHORACIC ECHOCARDIOGRAM  01/'15; 4/'19   a) EF 60-65%. NO RWMA. Ao Sclerosis. MAC - no MS with mild MR>  B Atriae mildly dilated;; b)  EF 55-60%. AoV Sclerosis (no stenosis).  Trivial AI & MR. Mild LAE &  Severe RAE.. Mild RV dilation  . TRANSURETHRAL RESECTION OF BLADDER TUMOR  11/11/2011   Procedure: TRANSURETHRAL RESECTION OF BLADDER TUMOR (TURBT);  Surgeon: Malka So;  Location: [facility];  Service: Urology;  Laterality: N/A;  Cysto, Bladder Biopsy, TURBT with [place],   . TRANSURETHRAL RESECTION OF BLADDER TUMOR WITH GYRUS (TURBT-GYRUS)  2007; 2009; 2010   "for tumors on surface of bladder"  . TUMOR EXCISION  1976; 1980's   "fatty tumor cut off her upper back; left thumb"  . VAGINAL HYSTERECTOMY  1960   partial     Family History  Problem Relation Age of Onset  . Anesthesia problems Daughter   . Cancer Mother        uterine  . Cancer Sister        leukemia  . Cancer Brother    . Prostate cancer Brother   . Cancer Brother   . Breast cancer Other        neice  . Heart disease Brother   . Cancer Brother   . Heart attack Sister   . Colon cancer Neg Hx     Social History   Socioeconomic History  . Marital status: Widowed    Spouse name: Not on file  . Number of children: 3  . Years of education: Not on file  . Highest education level: 8th grade  Occupational History  . Occupation: Retired  Tobacco Use  . Smoking status: Former Smoker    Packs/day: 1.00    Years: 40.00    Pack years: 40.00    Types: Cigarettes    Quit date: 11/07/1982    Years since quitting: 38.1  . Smokeless tobacco: Former Systems developer    Types: Snuff  Vaping Use  . Vaping Use: Never used  Substance and Sexual Activity  . Alcohol use: Yes    Alcohol/week: 7.0 standard drinks    Types: 7 Glasses of wine per week    Comment: 1 glass of wine prior to bed  . Drug use: No  . Sexual activity: Not Currently    Birth control/protection: Surgical  Other Topics Concern  . Not on file  Social History Narrative   ** Merged History Encounter **       She is a widowed mother of 2, grandmother of 64, great grandmother of 77. She does not   really get routine exercise. She quit smoking in 1983. She does not smoke and does not use illicit drugs and does not drink.   Social Determinants of Health   Financial Resource Strain: Not on file  Food Insecurity: Not on file  Transportation Needs: Not on file  Physical Activity: Not on file  Stress: Not on file  Social Connections: Not on file  Intimate Partner Violence: Not on file    Outpatient Medications Prior to Visit  Medication Sig Dispense Refill  . calcium carbonate (OS-CAL - DOSED IN MG OF ELEMENTAL CALCIUM) 1250 MG tablet Take 1 tablet by mouth every morning.     . cholecalciferol (VITAMIN D) 1000 UNITS tablet Take 1,000 Units by mouth daily.    . diclofenac Sodium (VOLTAREN) 1 % GEL Apply topically as needed.     Marland Kitchen DILT-XR 240 MG 24  hr capsule TAKE 1 CAPSULE BY MOUTH EVERY MORNING ON AN EMPTY STOMACH 90 capsule 5  . furosemide (LASIX) 20 MG tablet Take 1 tablet (20 mg total) by mouth daily. (Patient taking differently: Take 20-40 mg by mouth daily. Take one tablet (20MG) by mouth  daily except on Mon, Wed, Friday take 2 tablets (40 MG)) 30 tablet 0  . lidocaine (LIDODERM) 5 % Place 1 patch onto the skin daily. (knee). Remove & Discard patch within 12 hours or as directed by MD 30 patch 0  . loratadine (CLARITIN) 10 MG tablet TAKE 1 TABLET BY MOUTH ONCE DAILY FOR ALLERGY    . Multiple Vitamins-Minerals (PRESERVISION AREDS 2+MULTI VIT PO) Take by mouth.    Marland Kitchen olopatadine (PATANOL) 0.1 % ophthalmic solution 1 drop 2 (two) times daily.    . Omega-3 Fatty Acids (FISH OIL) 1000 MG CAPS Take 1,000 mg by mouth daily.    Marland Kitchen omeprazole (PRILOSEC) 20 MG capsule TAKE 1 CAPSULE BY MOUTH EVERY DAY 90 capsule 3  . PROAIR HFA 108 (90 Base) MCG/ACT inhaler TAKE 2 PUFFS BY MOUTH EVERY 6 HOURS AS NEEDED FOR WHEEZE OR SHORTNESS OF BREATH 8.5 Inhaler 4  . warfarin (COUMADIN) 3 MG tablet TAKE 1 TABLET BY MOUTH EVERY DAY EXCEPT TAKE 2 ON MONDAY, WEDNESDAY, FRIDAY, AND SATURDAY 150 tablet 1  . atorvastatin (LIPITOR) 10 MG tablet Take 10 mg by mouth daily.     No facility-administered medications prior to visit.    Allergies  Allergen Reactions  . Contrast Media [Iodinated Diagnostic Agents] Hives  . Amoxicillin Itching  . Ampicillin Itching  . Cortisol [Hydrocortisone] Itching  . Ivp Dye [Iodinated Diagnostic Agents] Hives  . Levaquin [Levofloxacin] Itching and Rash  . Penicillins Rash    "haven't had it in years"  . Vancomycin Itching and Rash    ROS Review of Systems  As per HPI   Objective:    Physical Exam Vitals and nursing note reviewed.  Constitutional:      General: She is not in acute distress.    Appearance: She is not ill-appearing or diaphoretic.  HENT:     Head: Normocephalic and atraumatic.  Cardiovascular:     Rate  and Rhythm: Normal rate and regular rhythm.     Heart sounds: Normal heart sounds. No murmur heard.   Pulmonary:     [place]: No respiratory distress.     Breath sounds: Normal breath sounds.  Musculoskeletal:     Cervical back: Neck supple. No tenderness.  Skin:    General: Skin is warm and dry.  Neurological:     Mental Status: She is alert and oriented to person, place, and time.  Psychiatric:        Mood and Affect: Mood normal.        Behavior: Behavior normal.     BP (!) 156/86   Pulse (!) 108   Temp (!) 97.5 F (36.4 C)   Resp 20   Ht _0  (1.651 m)   Wt 154 lb (69.9 kg)   SpO2 97%   BMI 25.63 kg/m  Wt Readings from Last 3 Encounters:  01/07/21 154 lb (69.9 kg)  12/17/20 154 lb (69.9 kg)  12/02/20 153 lb (69.4 kg)     Health Maintenance Due  Topic Date Due  . COVID-19 Vaccine (1) Never done  . TETANUS/TDAP  Never done    There are no preventive care reminders to display for this patient.  Lab Results  Component Value Date   TSH 4.450 12/17/2020   Lab Results  Component Value Date   WBC 8.0 12/17/2020   HGB 13.3 12/17/2020   HCT 39.9 12/17/2020   MCV 96 12/17/2020   PLT 268 12/17/2020   Lab Results  Component Value Date   NA  136 12/17/2020   K 3.7 12/17/2020   CO2 25 12/17/2020   GLUCOSE 84 12/17/2020   BUN 11 12/17/2020   CREATININE 0.71 12/17/2020   BILITOT 0.4 12/17/2020   ALKPHOS 102 12/17/2020   AST 19 12/17/2020   ALT 16 12/17/2020   PROT 6.7 12/17/2020   ALBUMIN 4.3 12/17/2020   CALCIUM 9.2 12/17/2020   ANIONGAP 13 12/18/2019   GFR 69.67 06/11/2013   No results found for: CHOL No results found for: HDL No results found for: LDLCALC No results found for: TRIG No results found for: CHOLHDL Lab Results  Component Value Date   HGBA1C (H) 09/18/2007    6.3 (NOTE)   The ADA recommends the following therapeutic goals for glycemic   control related to Hgb A1C measurement:   Goal of Therapy:   < 7.0% Hgb A1C   Action Suggested:  >  8.0% Hgb A1C   Ref:  Diabetes Care, 22, Suppl. 1, 1999      Assessment & Plan:    [name] was seen today for anticoagulation.  Diagnoses and all orders for this visit:  Supratherapeutic INR/Chronic atrial fibrillation ([place]) Description   INR 3.2 (Too thin) Goal- 2-3  Go down to 1 tablet (8m) daily except on Monday take 2 tablets (673mtotal)   Recheck INR in 2 weeks.     -     CoaguChek XS/INR Waived -     POCT INR  Hypercholesteremia Refill provided. Liver enzymes were normal on labs from 12/17/20. Denies side effects.  -     atorvastatin (LIPITOR) 10 MG tablet; Take 1 tablet (10 mg total) by mouth daily.   Follow-up: Return in about 2 weeks (around 01/21/2021) for INR.   The patient indicates understanding of these issues and agrees with the plan.  TiGwenlyn PerkingFNP

## 2021-01-19 ENCOUNTER — Other Ambulatory Visit: Payer: Self-pay

## 2021-01-19 MED ORDER — BACLOFEN 10 MG PO TABS: ORAL_TABLET | ORAL | 1 refills | Status: DC

## 2021-01-19 NOTE — Telephone Encounter (Signed)
  Prescription Request  01/19/2021  What is the name of the medication or equipment? BACLOFEN  Have you contacted your pharmacy to request a refill? (if applicable) yes she had one more refill but they would not refill it at the pharmacy the bottle says one refill by 05/06/21  Which pharmacy would you like this sent to? [facility]    Patient notified that their request is being sent to the clinical staff for review and that they should receive a response within 2 business days.

## 2021-01-21 ENCOUNTER — Encounter: Payer: Self-pay | Admitting: Family Medicine

## 2021-01-21 ENCOUNTER — Ambulatory Visit (INDEPENDENT_AMBULATORY_CARE_PROVIDER_SITE_OTHER): Payer: PPO | Admitting: Family Medicine

## 2021-01-21 ENCOUNTER — Ambulatory Visit: Payer: PPO | Admitting: Family Medicine

## 2021-01-21 ENCOUNTER — Other Ambulatory Visit: Payer: Self-pay

## 2021-01-21 VITALS — BP 131/62 | HR 91 | Temp 91.0°F | Ht 65.0 in | Wt 153.4 lb

## 2021-01-21 DIAGNOSIS — I482 Chronic atrial fibrillation, unspecified: Secondary | ICD-10-CM

## 2021-01-21 DIAGNOSIS — Z7901 Long term (current) use of anticoagulants: Secondary | ICD-10-CM | POA: Diagnosis not present

## 2021-01-21 LAB — COAGUCHEK XS/INR WAIVED
INR: 2.9 — ABNORMAL HIGH (ref 0.9–1.1)
Prothrombin Time: 35.1 s

## 2021-01-21 LAB — POCT INR: INR: 2.9 (ref 2–3)

## 2021-01-21 NOTE — Patient Instructions (Signed)
Description   INR is at goal at 2.9. Goal- 2-3  Continue 1 tablet (3mg ) daily except on Monday take 2 tablets (6mg  total)   Recheck INR in 2 weeks.      Vitamin K Foods and Warfarin Warfarin is a blood thinner (anticoagulant). Anticoagulant medicines help prevent the formation of blood clots. Warfarin works by blocking the activity of vitamin K, which promotes normal blood clotting. When you take warfarin, problems can occur from suddenly increasing or decreasing the amount of vitamin K that you eat from one day to the next. These problems can occur due to varying levels of warfarin in your blood. Problems may include:  Blood clots.  Bleeding. What are tips for eating the right amount of vitamin K? To avoid problems when taking warfarin:  Eat a balanced diet that includes: ? Fresh fruits and vegetables. ? Whole grains. ? Low-fat dairy products. ? Lean proteins, such as fish, eggs, and lean cuts of meat.  Keep your intake of vitamin K consistent from day to day. To do this: ? Avoid eating large amounts of vitamin K one day and low amounts of vitamin K the next day. ? If you take a multivitamin that contains vitamin K, be sure to take it every day. ? Know which foods contain vitamin K. Read food labels. Use the lists below to understand serving sizes and the amount of vitamin K in one serving.  Avoid major changes in your diet. If you are going to change your diet, talk with your health care provider before making changes.  Work with a Financial planner (dietitian) to develop a meal plan that works best for you.   What foods are high in vitamin K? Foods that are high in vitamin K contain more than 100 mcg (micrograms) per serving. These include:  Broccoli (cooked from fresh) -  cup (78 g) has 110 mcg.  Brussels sprouts (cooked from fresh) -  cup (78 g) has 109 mcg.  Greens, beet (cooked from fresh) -  cup (72 g) has 350 mcg.  Greens, collard (cooked from fresh) -  cup  (66 g) has 263 mcg.  Greens, turnip (cooked from fresh) -  cup (72 g) has 265 mcg.  Green onions or scallions -  cup (50 g) has 105 mcg.  Kale (cooked from fresh) -  cup (68 g) has 536 mcg.  Parsley (raw) - 10 sprigs (10 g) has 164 mcg.  Spinach (cooked from fresh) -  cup (90 g) has 444 mcg.  Swiss chard (cooked from fresh) -  cup (88 g) has 287 mcg.   What foods have a moderate amount of vitamin K? Foods that have a moderate amount of vitamin K contain 25-100 mcg per serving. These include:  Asparagus (cooked from fresh) - 4 spears (60 g) have 30 mcg.  Black-eyed peas (dried) -  cup (85 g) has 32 mcg.  Cabbage (cooked from fresh) -  cup (78 g) has 84 mcg.  Cabbage (raw) -  cup (35 g) has 26 mcg.  Kiwi fruit - 1 medium (69 g) has 27 mcg.  Lettuce (raw) - 1 cup (36 g) has 45 mcg.  Okra (cooked from fresh) -  cup (80 g) has 32 mcg.  Prunes (dried) - 5 prunes (47 g) have 25 mcg.  Watercress (raw) - 1 cup (34 g) has 85 mcg. What foods are low in vitamin K? Foods low in vitamin K contain less than 25 mcg per serving. These include:  Artichoke - 1 medium (128 g) has 18 mcg.  Avocado - 1 oz (21 g) has 6 mcg.  Blueberries -  cup (73 g) has 14 mcg.  Carrots (cooked from fresh) -  cup (78 g) has 11 mcg.  Cauliflower (raw) -  cup (54 g) has 8 mcg.  Cucumber with peel (raw) -  cup (52 g) has 9 mcg.  Grapes -  cup (76 g) has 12 mcg.  Mango - 1 medium (207 g) has 9 mcg.  Mixed nuts - 1 cup (142 g) has 17 mcg.  Pear - 1 medium (178 g) has 8 mcg.  Peas (cooked from fresh) -  cup (80 g) has 20 mcg.  Pickled cucumber - 1 spear (65 g) has 11 mcg.  Sauerkraut (canned) -  cup (118 g) has 16 mcg.  Soybeans (cooked from fresh) -  cup (86 g) has 16 mcg.  Tomato (raw) - 1 medium (123 g) has 10 mcg.  Tomato sauce (raw) -  cup (123 g) has 17 mcg.   What foods do not have vitamin K? If a food contains less than 5 mcg per serving, it is considered to have no  vitamin K. These foods include:  Bread and cereal products.  Cheese.  Eggs.  Fish and shellfish.  Meat and poultry.  Milk and dairy products.  Seeds, such as sunflower or pumpkin seeds. The items listed above may not be a complete list of foods that have high, moderate, and low amounts of vitamin K or do not have vitamin K. Actual amounts of vitamin K may differ depending on processing. Contact a dietitian for more information. Summary  Warfarin is an anticoagulant that prevents blood clots by blocking the activity of vitamin K. It is important to monitor the content of vitamin K in your foods and to be consistent with the amount of vitamin K you take in each day.  Avoid major changes in your diet. If you are going to change your diet, talk with your health care provider before making changes. This information is not intended to replace advice given to you by your health care provider. Make sure you discuss any questions you have with your health care provider. Document Revised: 09/18/2019 Document Reviewed: 09/18/2019 Elsevier Patient Education  [place].

## 2021-01-21 NOTE — Progress Notes (Signed)
Established Patient Office Visit  Subjective:  Patient ID: [name], female    DOB: March 13, 1921  Age: 87 y.o. MRN: [id number]  CC:  Chief Complaint  Patient presents with  . Anticoagulation    HPI MEKENZIE Chambers presents for INR check.   Current regimen: 6 mg on mondays, 3 mg all other days  Indication: chronic A. fib Bleeding Signs/Symptoms:denies Thromboembolic Signs/Symptoms:denies  Missed Coumadin Doses:none Medication Changes:none Dietary Changes:denies Bacterial/Viral Infection:denies  Denies palpitations or falls. Last few INRs were too high.  Past Medical History:  Diagnosis Date  . AAA (abdominal aortic aneurysm) ([name])   . Allergy   . Anxiety   . Bladder cancer ([name]) dx'd 2011   Chronic microscopic hematuria; transitional cell cancer  . Blood transfusion 1960   "related to hysterectomy"  . Chronic atrial fibrillation (HCC)    Anticoagulated with warfarin, rate control with diltiazem and Toprol  . Chronic back pain   . Chronic headache    "probably 2/wk" (08/28/2015)  . Concussion w/o coma 03/27/8308   Complicated by subarachnoid hemorrhage.  "even now has times when she's not able to comprehend" (05/08/12)  . Coronary artery disease    Nonobstructive  . Diverticulitis   . Diverticulosis   . DVT (deep venous thrombosis), right 2005   "right calf after 8 foot fall"  . Edema of both legs    Chronic, thought to be secondary to DVTs  . External hemorrhoids   . Family history of adverse reaction to anesthesia 2012   daughter "had OR for crushed hand; had problems w/anesthesia & I was in there all day long" (  . Fatty liver   . Frequent UTI   . GERD (gastroesophageal reflux disease)   . H/O hiatal hernia   . High cholesterol   . Migraine    "rare now" (08/28/2015)  . Pneumonia ~ 2010;  2013; 07/2015  . Rheumatoid arthritis(714.0)    "hands" (08/28/2015)  . Urinary frequency     Past Surgical History:  Procedure Laterality  Date  . BREAST CYST EXCISION Left 1960's?   2 cysts; benign  . CARDIAC CATHETERIZATION  1980's  . CATARACT EXTRACTION W/ INTRAOCULAR LENS  IMPLANT, BILATERAL Bilateral 1992  . CYSTOSCOPY  11/11/2011   Procedure: CYSTOSCOPY;  Surgeon: Malka So;  Location: [facility];  Service: Urology;  Laterality: N/A;  . CYSTOSTOMY W/ BLADDER BIOPSY  10/08/2013  . INCONTINENCE SURGERY  1980's  . JOINT REPLACEMENT    . KNEE ARTHROSCOPY Right 2005   S/P fall  . Lower Extremity Venous Doppler  11/10/2013   No DVT or superficial thrombus enlarged inguinal lymph node noted in the right. No Baker's cyst.  . TONSILLECTOMY  1952  . TOTAL KNEE ARTHROPLASTY Right 02/05/2013   Procedure: TOTAL KNEE ARTHROPLASTY;  Surgeon: Mauri Pole, MD;  Location: [facility];  Service: Orthopedics;  Laterality: Right;  . TRANSTHORACIC ECHOCARDIOGRAM  01/'15; 4/'19   a) EF 60-65%. NO RWMA. Ao Sclerosis. MAC - no MS with mild MR>  B Atriae mildly dilated;; b)  EF 55-60%. AoV Sclerosis (no stenosis).  Trivial AI & MR. Mild LAE & Severe RAE.. Mild RV dilation  . TRANSURETHRAL RESECTION OF BLADDER TUMOR  11/11/2011   Procedure: TRANSURETHRAL RESECTION OF BLADDER TUMOR (TURBT);  Surgeon: Malka So;  Location: [facility];  Service: Urology;  Laterality: N/A;  Cysto, Bladder Biopsy, TURBT with Gyrus,   . TRANSURETHRAL RESECTION OF BLADDER TUMOR WITH GYRUS (TURBT-GYRUS)  2007; 2009; 2010   "  for tumors on surface of bladder"  . TUMOR EXCISION  1976; 1980's   "fatty tumor cut off her upper back; left thumb"  . VAGINAL HYSTERECTOMY  1960   partial     Family History  Problem Relation Age of Onset  . Anesthesia problems Daughter   . Cancer Mother        uterine  . Cancer Sister        leukemia  . Cancer Brother   . Prostate cancer Brother   . Cancer Brother   . Breast cancer Other        neice  . Heart disease Brother   . Cancer Brother   . Heart attack Sister   . Colon cancer Neg Hx     Social History   Socioeconomic  History  . Marital status: Widowed    Spouse name: Not on file  . Number of children: 3  . Years of education: Not on file  . Highest education level: 8th grade  Occupational History  . Occupation: Retired  Tobacco Use  . Smoking status: Former Smoker    Packs/day: 1.00    Years: 40.00    Pack years: 40.00    Types: Cigarettes    Quit date: 11/07/1982    Years since quitting: 38.2  . Smokeless tobacco: Former Systems developer    Types: Snuff  Vaping Use  . Vaping Use: Never used  Substance and Sexual Activity  . Alcohol use: Yes    Alcohol/week: 7.0 standard drinks    Types: 7 Glasses of wine per week    Comment: 1 glass of wine prior to bed  . Drug use: No  . Sexual activity: Not Currently    Birth control/protection: Surgical  Other Topics Concern  . Not on file  Social History Narrative   ** Merged History Encounter **       She is a widowed mother of 38, grandmother of 66, great grandmother of 1. She does not   really get routine exercise. She quit smoking in 1983. She does not smoke and does not use illicit drugs and does not drink.   Social Determinants of Health   Financial Resource Strain: Not on file  Food Insecurity: Not on file  Transportation Needs: Not on file  Physical Activity: Not on file  Stress: Not on file  Social Connections: Not on file  Intimate Partner Violence: Not on file    Outpatient Medications Prior to Visit  Medication Sig Dispense Refill  . atorvastatin (LIPITOR) 10 MG tablet Take 1 tablet (10 mg total) by mouth daily. 90 tablet 0  . baclofen (LIORESAL) 10 MG tablet TAKE 1 TABLET BY MOUTH AT BEDTIME AS NEEDED FOR MUSCLE SPASMS 30 tablet 1  . calcium carbonate (OS-CAL - DOSED IN MG OF ELEMENTAL CALCIUM) 1250 MG tablet Take 1 tablet by mouth every morning.     . cholecalciferol (VITAMIN D) 1000 UNITS tablet Take 1,000 Units by mouth daily.    . diclofenac Sodium (VOLTAREN) 1 % GEL Apply topically as needed.     Marland Kitchen DILT-XR 240 MG 24 hr capsule TAKE 1  CAPSULE BY MOUTH EVERY MORNING ON AN EMPTY STOMACH 90 capsule 5  . furosemide (LASIX) 20 MG tablet Take 1 tablet (20 mg total) by mouth daily. (Patient taking differently: Take 20-40 mg by mouth daily. Take one tablet (20MG) by mouth daily except on Mon, Wed, Friday take 2 tablets (40 MG)) 30 tablet 0  . lidocaine (LIDODERM) 5 % Place  1 patch onto the skin daily. (knee). Remove & Discard patch within 12 hours or as directed by MD 30 patch 0  . loratadine (CLARITIN) 10 MG tablet TAKE 1 TABLET BY MOUTH ONCE DAILY FOR ALLERGY    . Multiple Vitamins-Minerals (PRESERVISION AREDS 2+MULTI VIT PO) Take by mouth.    Marland Kitchen olopatadine (PATANOL) 0.1 % ophthalmic solution 1 drop 2 (two) times daily.    . Omega-3 Fatty Acids (FISH OIL) 1000 MG CAPS Take 1,000 mg by mouth daily.    Marland Kitchen omeprazole (PRILOSEC) 20 MG capsule TAKE 1 CAPSULE BY MOUTH EVERY DAY 90 capsule 3  . PROAIR HFA 108 (90 Base) MCG/ACT inhaler TAKE 2 PUFFS BY MOUTH EVERY 6 HOURS AS NEEDED FOR WHEEZE OR SHORTNESS OF BREATH 8.5 Inhaler 4  . warfarin (COUMADIN) 3 MG tablet TAKE 1 TABLET BY MOUTH EVERY DAY EXCEPT TAKE 2 ON MONDAY, WEDNESDAY, FRIDAY, AND SATURDAY 150 tablet 1   No facility-administered medications prior to visit.    Allergies  Allergen Reactions  . Contrast Media [Iodinated Diagnostic Agents] Hives  . Amoxicillin Itching  . Ampicillin Itching  . Cortisol [Hydrocortisone] Itching  . Ivp Dye [Iodinated Diagnostic Agents] Hives  . Levaquin [Levofloxacin] Itching and Rash  . Penicillins Rash    "haven't had it in years"  . Vancomycin Itching and Rash    ROS Review of Systems As per HPI.     Objective:    Physical Exam Vitals and nursing note reviewed.  Constitutional:      General: She is not in acute distress.    Appearance: Normal appearance. She is not ill-appearing, toxic-appearing or diaphoretic.  HENT:     Head: Normocephalic and atraumatic.  Cardiovascular:     Rate and Rhythm: Normal rate. Rhythm irregularly  irregular.     Heart sounds: Normal heart sounds. No murmur heard.   Pulmonary:     Effort: Pulmonary effort is normal. No respiratory distress.     Breath sounds: Normal breath sounds.  Skin:    General: Skin is warm and dry.  Neurological:     Mental Status: She is alert and oriented to person, place, and time.  Psychiatric:        Mood and Affect: Mood normal.        Behavior: Behavior normal.        Thought Content: Thought content normal.        Judgment: Judgment normal.     BP 131/62   Pulse 91   Temp (!) 91 F (32.8 C)   Ht _0  (1.651 m)   Wt 153 lb 6.4 oz (69.6 kg)   SpO2 97%   BMI 25.53 kg/m  Wt Readings from Last 3 Encounters:  01/21/21 153 lb 6.4 oz (69.6 kg)  01/07/21 154 lb (69.9 kg)  12/17/20 154 lb (69.9 kg)     Health Maintenance Due  Topic Date Due  . COVID-19 Vaccine (1) Never done  . TETANUS/TDAP  Never done    There are no preventive care reminders to display for this patient.  Lab Results  Component Value Date   TSH 4.450 12/17/2020   Lab Results  Component Value Date   WBC 8.0 12/17/2020   HGB 13.3 12/17/2020   HCT 39.9 12/17/2020   MCV 96 12/17/2020   PLT 268 12/17/2020   Lab Results  Component Value Date   NA 136 12/17/2020   K 3.7 12/17/2020   CO2 25 12/17/2020   GLUCOSE 84 12/17/2020   BUN  11 12/17/2020   CREATININE 0.71 12/17/2020   BILITOT 0.4 12/17/2020   ALKPHOS 102 12/17/2020   AST 19 12/17/2020   ALT 16 12/17/2020   PROT 6.7 12/17/2020   ALBUMIN 4.3 12/17/2020   CALCIUM 9.2 12/17/2020   ANIONGAP 13 12/18/2019   GFR 69.67 06/11/2013   No results found for: CHOL No results found for: HDL No results found for: LDLCALC No results found for: TRIG No results found for: CHOLHDL Lab Results  Component Value Date   HGBA1C (H) 09/18/2007    6.3 (NOTE)   The ADA recommends the following therapeutic goals for glycemic   control related to Hgb A1C measurement:   Goal of Therapy:   < 7.0% Hgb A1C   Action  Suggested:  > 8.0% Hgb A1C   Ref:  Diabetes Care, 22, Suppl. 1, 1999      Assessment & Plan:   Lynnleigh was seen today for anticoagulation.  Diagnoses and all orders for this visit:  Chronic atrial fibrillation (HCC)/Chronic anticoagulation At goal today, continue current regimen.  Description   INR is at goal at 2.9. Goal- 2-3  Continue 1 tablet (31m) daily except on Monday take 2 tablets (658mtotal)   Recheck INR in 2 weeks.     -     CoaguChek XS/INR Waived -     POCT INR     Follow-up: Return in about 2 weeks (around 02/04/2021) for INR.   The patient indicates understanding of these issues and agrees with the plan.  TiGwenlyn PerkingFNP

## 2021-02-03 ENCOUNTER — Ambulatory Visit: Payer: PPO | Admitting: Family Medicine

## 2021-02-10 ENCOUNTER — Other Ambulatory Visit: Payer: Self-pay

## 2021-02-10 ENCOUNTER — Ambulatory Visit (INDEPENDENT_AMBULATORY_CARE_PROVIDER_SITE_OTHER): Payer: PPO | Admitting: Family Medicine

## 2021-02-10 VITALS — BP 162/75 | HR 91 | Temp 97.4°F | Ht 65.0 in | Wt 151.0 lb

## 2021-02-10 DIAGNOSIS — I10 Essential (primary) hypertension: Secondary | ICD-10-CM | POA: Diagnosis not present

## 2021-02-10 DIAGNOSIS — R791 Abnormal coagulation profile: Secondary | ICD-10-CM | POA: Diagnosis not present

## 2021-02-10 DIAGNOSIS — I482 Chronic atrial fibrillation, unspecified: Secondary | ICD-10-CM

## 2021-02-10 DIAGNOSIS — Z7901 Long term (current) use of anticoagulants: Secondary | ICD-10-CM

## 2021-02-10 DIAGNOSIS — K219 Gastro-esophageal reflux disease without esophagitis: Secondary | ICD-10-CM

## 2021-02-10 LAB — POCT INR: INR: 1.6 — AB (ref 2–3)

## 2021-02-10 LAB — COAGUCHEK XS/INR WAIVED
INR: 1.6 — ABNORMAL HIGH (ref 0.9–1.1)
Prothrombin Time: 19.1 s

## 2021-02-10 NOTE — Patient Instructions (Signed)
3/8 at [facility] for Gastroesophageal Reflux Disease, Adult When you have gastroesophageal reflux disease (GERD), the foods you eat and your eating habits are very important. Choosing the right foods can help ease your discomfort. Think about working with a food expert (dietitian) to help you make good choices. What are tips for following this plan? Reading food labels  Look for foods that are low in saturated fat. Foods that may help with your symptoms include: ? Foods that have less than 5% of daily value (DV) of fat. ? Foods that have 0 grams of trans fat. Cooking  Do not fry your food.  Cook your food by baking, steaming, grilling, or broiling. These are all methods that do not need a lot of fat for cooking.  To add flavor, try to use herbs that are low in spice and acidity. Meal planning  Choose healthy foods that are low in fat, such as: ? Fruits and vegetables. ? Whole grains. ? Low-fat dairy products. ? Lean meats, fish, and poultry.  Eat small meals often instead of eating 3 large meals each day. Eat your meals slowly in a place where you are relaxed. Avoid bending over or lying down until 2-3 hours after eating.  Limit high-fat foods such as fatty meats or fried foods.  Limit your intake of fatty foods, such as oils, butter, and shortening.  Avoid the following as told by your doctor: ? Foods that cause symptoms. These may be different for different people. Keep a food diary to keep track of foods that cause symptoms. ? Alcohol. ? Drinking a lot of liquid with meals. ? Eating meals during the 2-3 hours before bed.   Lifestyle  Stay at a healthy weight. Ask your doctor what weight is healthy for you. If you need to lose weight, work with your doctor to do so safely.  Exercise for at least 30 minutes on 5 or more days each week, or as told by your doctor.  Wear loose-fitting clothes.  Do not smoke or use any products that contain nicotine or tobacco. If  you need help quitting, ask your doctor.  Sleep with the head of your bed higher than your feet. Use a wedge under the mattress or blocks under the bed frame to raise the head of the bed.  Chew sugar-free gum after meals. What foods should eat? Eat a healthy, well-balanced diet of fruits, vegetables, whole grains, low-fat dairy products, lean meats, fish, and poultry. Each person is different. Foods that may cause symptoms in one person may not cause any symptoms in another person. Work with your doctor to find foods that are safe for you. The items listed above may not be a complete list of what you can eat and drink. Contact a food expert for more options.   What foods should I avoid? Limiting some of these foods may help in managing the symptoms of GERD. Everyone is different. Talk with a food expert or your doctor to help you find the exact foods to avoid, if any. Fruits Any fruits prepared with added fat. Any fruits that cause symptoms. For some people, this may include citrus fruits, such as oranges, grapefruit, pineapple, and lemons. Vegetables Deep-fried vegetables. [place] fries. Any vegetables prepared with added fat. Any vegetables that cause symptoms. For some people, this may include tomatoes and tomato products, chili peppers, onions and garlic, and horseradish. Grains Pastries or quick breads with added fat. Meats and other proteins High-fat meats, such as  fatty beef or pork, hot dogs, ribs, ham, sausage, salami, and bacon. Fried meat or protein, including fried fish and fried chicken. Nuts and nut butters, in large amounts. Dairy Whole milk and chocolate milk. Sour cream. Cream. Ice cream. Cream cheese. Milkshakes. Fats and oils Butter. Margarine. Shortening. Ghee. Beverages Coffee and tea, with or without caffeine. Carbonated beverages. Sodas. Energy drinks. Fruit juice made with acidic fruits, such as orange or grapefruit. Tomato juice. Alcoholic drinks. Sweets and  desserts Chocolate and cocoa. Donuts. Seasonings and condiments Pepper. Peppermint and spearmint. Added salt. Any condiments, herbs, or seasonings that cause symptoms. For some people, this may include curry, hot sauce, or vinegar-based salad dressings. The items listed above may not be a complete list of what you should not eat and drink. Contact a food expert for more options. Questions to ask your doctor Diet and lifestyle changes are often the first steps that are taken to manage symptoms of GERD. If diet and lifestyle changes do not help, talk with your doctor about taking medicines. Where to find more information  International Foundation for Gastrointestinal Disorders: aboutgerd.org Summary  When you have GERD, food and lifestyle choices are very important in easing your symptoms.  Eat small meals often instead of 3 large meals a day. Eat your meals slowly and in a place where you are relaxed.  Avoid bending over or lying down until 2-3 hours after eating.  Limit high-fat foods such as fatty meats or fried foods. This information is not intended to replace advice given to you by your health care provider. Make sure you discuss any questions you have with your health care provider. Document Revised: 06/09/2020 Document Reviewed: 06/09/2020 Elsevier Patient Education  [place].

## 2021-02-10 NOTE — Progress Notes (Signed)
Subjective: CC: INR check PCP: Janora Norlander, DO [name] is a 87 y.o. female presenting to clinic today for:  1. AFib/hypertension Patient reports compliance with 6 mg of Coumadin on Mondays and 3 mg daily all other days.  Her last INR was therapeutic at 2.9.  She denies any bleeding.  No missed medication.  No change in diet.  She is compliant with her Lasix, diltiazem  2.  GERD Patient reports that she is been having some nausea after certain meals like bologna sandwiches and coffee.  She denies any vomiting, hematochezia or melena.  She is compliant with her PPI.   ROS: Per HPI  Allergies  Allergen Reactions  . Contrast Media [Iodinated Diagnostic Agents] Hives  . Amoxicillin Itching  . Ampicillin Itching  . Cortisol [Hydrocortisone] Itching  . Ivp Dye [Iodinated Diagnostic Agents] Hives  . Levaquin [Levofloxacin] Itching and Rash  . Penicillins Rash    "haven't had it in years"  . Vancomycin Itching and Rash   Past Medical History:  Diagnosis Date  . AAA (abdominal aortic aneurysm) ([place])   . Allergy   . Anxiety   . Bladder cancer ([place]) dx'd 2011   Chronic microscopic hematuria; transitional cell cancer  . Blood transfusion 1960   "related to hysterectomy"  . Chronic atrial fibrillation (HCC)    Anticoagulated with warfarin, rate control with diltiazem and Toprol  . Chronic back pain   . Chronic headache    "probably 2/wk" (08/28/2015)  . Concussion w/o coma 03/08/7123   Complicated by subarachnoid hemorrhage.  "even now has times when she's not able to comprehend" (05/08/12)  . Coronary artery disease    Nonobstructive  . Diverticulitis   . Diverticulosis   . DVT (deep venous thrombosis), right 2005   "right calf after 8 foot fall"  . Edema of both legs    Chronic, thought to be secondary to DVTs  . External hemorrhoids   . Family history of adverse reaction to anesthesia 2012   daughter "had OR for crushed hand; had problems w/anesthesia  & I was in there all day long" (  . Fatty liver   . Frequent UTI   . GERD (gastroesophageal reflux disease)   . H/O hiatal hernia   . High cholesterol   . Migraine    "rare now" (08/28/2015)  . Pneumonia ~ 2010;  2013; 07/2015  . Rheumatoid arthritis(714.0)    "hands" (08/28/2015)  . Urinary frequency     Current Outpatient Medications:  .  atorvastatin (LIPITOR) 10 MG tablet, Take 1 tablet (10 mg total) by mouth daily., Disp: 90 tablet, Rfl: 0 .  baclofen (LIORESAL) 10 MG tablet, TAKE 1 TABLET BY MOUTH AT BEDTIME AS NEEDED FOR MUSCLE SPASMS, Disp: 30 tablet, Rfl: 1 .  calcium carbonate (OS-CAL - DOSED IN MG OF ELEMENTAL CALCIUM) 1250 MG tablet, Take 1 tablet by mouth every morning. , Disp: , Rfl:  .  cholecalciferol (VITAMIN D) 1000 UNITS tablet, Take 1,000 Units by mouth daily., Disp: , Rfl:  .  diclofenac Sodium (VOLTAREN) 1 % GEL, Apply topically as needed. , Disp: , Rfl:  .  DILT-XR 240 MG 24 hr capsule, TAKE 1 CAPSULE BY MOUTH EVERY MORNING ON AN EMPTY STOMACH, Disp: 90 capsule, Rfl: 5 .  furosemide (LASIX) 20 MG tablet, Take 1 tablet (20 mg total) by mouth daily. (Patient taking differently: Take 20-40 mg by mouth daily. Take one tablet (20MG ) by mouth daily except on Mon, Wed, Friday take  2 tablets (40 MG)), Disp: 30 tablet, Rfl: 0 .  lidocaine (LIDODERM) 5 %, Place 1 patch onto the skin daily. (knee). Remove & Discard patch within 12 hours or as directed by MD, Disp: 30 patch, Rfl: 0 .  loratadine (CLARITIN) 10 MG tablet, TAKE 1 TABLET BY MOUTH ONCE DAILY FOR ALLERGY, Disp: , Rfl:  .  Multiple Vitamins-Minerals (PRESERVISION AREDS 2+MULTI VIT PO), Take by mouth., Disp: , Rfl:  .  olopatadine (PATANOL) 0.1 % ophthalmic solution, 1 drop 2 (two) times daily., Disp: , Rfl:  .  Omega-3 Fatty Acids (FISH OIL) 1000 MG CAPS, Take 1,000 mg by mouth daily., Disp: , Rfl:  .  omeprazole (PRILOSEC) 20 MG capsule, TAKE 1 CAPSULE BY MOUTH EVERY DAY, Disp: 90 capsule, Rfl: 3 .  PROAIR HFA 108 (90  Base) MCG/ACT inhaler, TAKE 2 PUFFS BY MOUTH EVERY 6 HOURS AS NEEDED FOR WHEEZE OR SHORTNESS OF BREATH, Disp: 8.5 Inhaler, Rfl: 4 .  warfarin (COUMADIN) 3 MG tablet, TAKE 1 TABLET BY MOUTH EVERY DAY EXCEPT TAKE 2 ON MONDAY, WEDNESDAY, FRIDAY, AND SATURDAY, Disp: 150 tablet, Rfl: 1 Social History   Socioeconomic History  . Marital status: Widowed    Spouse name: Not on file  . Number of children: 3  . Years of education: Not on file  . Highest education level: 8th grade  Occupational History  . Occupation: Retired  Tobacco Use  . Smoking status: Former Smoker    Packs/day: 1.00    Years: 40.00    Pack years: 40.00    Types: Cigarettes    Quit date: 11/07/1982    Years since quitting: 38.2  . Smokeless tobacco: Former Systems developer    Types: Snuff  Vaping Use  . Vaping Use: Never used  Substance and Sexual Activity  . Alcohol use: Yes    Alcohol/week: 7.0 standard drinks    Types: 7 Glasses of wine per week    Comment: 1 glass of wine prior to bed  . Drug use: No  . Sexual activity: Not Currently    Birth control/protection: Surgical  Other Topics Concern  . Not on file  Social History Narrative   ** Merged History Encounter **       She is a widowed mother of 65, grandmother of 61, great grandmother of 9. She does not   really get routine exercise. She quit smoking in 1983. She does not smoke and does not use illicit drugs and does not drink.   Social Determinants of Health   Financial Resource Strain: Not on file  Food Insecurity: Not on file  Transportation Needs: Not on file  Physical Activity: Not on file  Stress: Not on file  Social Connections: Not on file  Intimate Partner Violence: Not on file   Family History  Problem Relation Age of Onset  . Anesthesia problems Daughter   . Cancer Mother        uterine  . Cancer Sister        leukemia  . Cancer Brother   . Prostate cancer Brother   . Cancer Brother   . Breast cancer Other        neice  . Heart disease  Brother   . Cancer Brother   . Heart attack Sister   . Colon cancer Neg Hx     Objective: Office vital signs reviewed. BP (!) 162/75   Pulse 91   Temp (!) 97.4 F (36.3 C) (Temporal)   Ht 5\' 5"  (1.651 m)  Wt 151 lb (68.5 kg)   SpO2 98%   BMI 25.13 kg/m   Physical Examination:  General: Awake, alert, well nourished, No acute distress HEENT: Normal;sclera white Cardio: Irregularly irregular with rate control Pulm: Normal work of breathing on room air  Assessment/ Plan: 87 y.o. female   Subtherapeutic international normalized ratio (INR)  Chronic atrial fibrillation ([place]) - Plan: CoaguChek XS/INR Waived, POCT INR  Chronic anticoagulation - Plan: CoaguChek XS/INR Waived, POCT INR  Gastroesophageal reflux disease without esophagitis  Elevated blood pressure reading in office with diagnosis of hypertension  INR subtherapeutic today.  Advised her to increase dose to 6 mg today then resume 6 mg on Mondays and Wednesdays with 3 mg daily all other days.  We will follow-up in 1 week.  She is aware of date and time  For her GERD I have recommended that she avoid acidic foods she may continue her PPI as prescribed.  May consider adding an H2 blocker if symptoms are persistent.  We will recheck next week  Blood pressure remains elevated despite recheck.  Patient is asymptomatic.  We will recheck this again in 1 week.  No orders of the defined types were placed in this encounter.  No orders of the defined types were placed in this encounter.    Janora Norlander, DO [place] [phone]

## 2021-02-11 ENCOUNTER — Encounter: Payer: Self-pay | Admitting: Family Medicine

## 2021-02-12 ENCOUNTER — Other Ambulatory Visit: Payer: Self-pay | Admitting: Family Medicine

## 2021-02-17 ENCOUNTER — Encounter: Payer: Self-pay | Admitting: Family Medicine

## 2021-02-17 ENCOUNTER — Ambulatory Visit (INDEPENDENT_AMBULATORY_CARE_PROVIDER_SITE_OTHER): Payer: PPO | Admitting: Family Medicine

## 2021-02-17 ENCOUNTER — Other Ambulatory Visit: Payer: Self-pay

## 2021-02-17 VITALS — BP 130/66 | HR 75 | Temp 98.2°F | Ht 65.0 in | Wt 151.0 lb

## 2021-02-17 DIAGNOSIS — R54 Age-related physical debility: Secondary | ICD-10-CM | POA: Diagnosis not present

## 2021-02-17 DIAGNOSIS — Z9181 History of falling: Secondary | ICD-10-CM

## 2021-02-17 DIAGNOSIS — I482 Chronic atrial fibrillation, unspecified: Secondary | ICD-10-CM

## 2021-02-17 DIAGNOSIS — M25561 Pain in right knee: Secondary | ICD-10-CM

## 2021-02-17 DIAGNOSIS — G8929 Other chronic pain: Secondary | ICD-10-CM | POA: Diagnosis not present

## 2021-02-17 LAB — COAGUCHEK XS/INR WAIVED
INR: 1.9 — ABNORMAL HIGH (ref 0.9–1.1)
Prothrombin Time: 22.3 s

## 2021-02-17 LAB — POCT INR: INR: 1.9 — AB (ref 2–3)

## 2021-02-17 NOTE — Patient Instructions (Signed)
Go up to 6mg  (2 tablets) today, Continue 6mg  on Mondays and Wednesdays with 3 mg daily all other days.  Recheck Tuesday.

## 2021-02-17 NOTE — Progress Notes (Signed)
Subjective: CC: INR recheck PCP: Janora Norlander, DO SEG:BTDVVOH E Un is a 87 y.o. female presenting to clinic today for:  1. INR/ afib Patient here for interval INR checkup.  She is subtherapeutic with INR 1.6 last visit.  She was increased by 3 mg and instructed to follow-up.  She reports compliance with her medications.  No missed doses.  No reports of hematochezia or melena.  2.  Debility/weakness Her daughter has noted some weakness.  She has frequent falls.  In fact she injured the posterior right lower extremity recently, which she would like me to take a look at.  She often does not comply with use of her walker.  She has had some difficulties with her bathtub.  Her daughter reports that she is her primary caregiver and is a retired Marine scientist.  She worries about her not eating enough and subsequently becoming weak.  She often sleeps during the day.   ROS: Per HPI  Allergies  Allergen Reactions  . Contrast Media [Iodinated Diagnostic Agents] Hives  . Amoxicillin Itching  . Ampicillin Itching  . Cortisol [Hydrocortisone] Itching  . Ivp Dye [Iodinated Diagnostic Agents] Hives  . Levaquin [Levofloxacin] Itching and Rash  . Penicillins Rash    "haven't had it in years"  . Vancomycin Itching and Rash   Past Medical History:  Diagnosis Date  . AAA (abdominal aortic aneurysm) ([place])   . Allergy   . Anxiety   . Bladder cancer ([place]) dx'd 2011   Chronic microscopic hematuria; transitional cell cancer  . Blood transfusion 1960   "related to hysterectomy"  . Chronic atrial fibrillation (HCC)    Anticoagulated with warfarin, rate control with diltiazem and Toprol  . Chronic back pain   . Chronic headache    "probably 2/wk" (08/28/2015)  . Concussion w/o coma 05/19/3709   Complicated by subarachnoid hemorrhage.  "even now has times when she's not able to comprehend" (05/08/12)  . Coronary artery disease    Nonobstructive  . Diverticulitis   . Diverticulosis   . DVT (deep  venous thrombosis), right 2005   "right calf after 8 foot fall"  . Edema of both legs    Chronic, thought to be secondary to DVTs  . External hemorrhoids   . Family history of adverse reaction to anesthesia 2012   daughter "had OR for crushed hand; had problems w/anesthesia & I was in there all day long" (  . Fatty liver   . Frequent UTI   . GERD (gastroesophageal reflux disease)   . H/O hiatal hernia   . High cholesterol   . Migraine    "rare now" (08/28/2015)  . Pneumonia ~ 2010;  2013; 07/2015  . Rheumatoid arthritis(714.0)    "hands" (08/28/2015)  . Urinary frequency     Current Outpatient Medications:  .  atorvastatin (LIPITOR) 10 MG tablet, Take 1 tablet (10 mg total) by mouth daily., Disp: 90 tablet, Rfl: 0 .  baclofen (LIORESAL) 10 MG tablet, TAKE 1 TABLET BY MOUTH AT BEDTIME AS NEEDED FOR MUSCLE SPASMS., Disp: 90 tablet, Rfl: 1 .  calcium carbonate (OS-CAL - DOSED IN MG OF ELEMENTAL CALCIUM) 1250 MG tablet, Take 1 tablet by mouth every morning. , Disp: , Rfl:  .  cholecalciferol (VITAMIN D) 1000 UNITS tablet, Take 1,000 Units by mouth daily., Disp: , Rfl:  .  diclofenac Sodium (VOLTAREN) 1 % GEL, Apply topically as needed. , Disp: , Rfl:  .  DILT-XR 240 MG 24 hr capsule, TAKE 1  CAPSULE BY MOUTH EVERY MORNING ON AN EMPTY STOMACH, Disp: 90 capsule, Rfl: 5 .  furosemide (LASIX) 20 MG tablet, Take 1 tablet (20 mg total) by mouth daily. (Patient taking differently: Take 20-40 mg by mouth daily. Take one tablet (20MG ) by mouth daily except on Mon, Wed, Friday take 2 tablets (40 MG)), Disp: 30 tablet, Rfl: 0 .  lidocaine (LIDODERM) 5 %, Place 1 patch onto the skin daily. (knee). Remove & Discard patch within 12 hours or as directed by MD, Disp: 30 patch, Rfl: 0 .  loratadine (CLARITIN) 10 MG tablet, TAKE 1 TABLET BY MOUTH ONCE DAILY FOR ALLERGY, Disp: , Rfl:  .  Multiple Vitamins-Minerals (PRESERVISION AREDS 2+MULTI VIT PO), Take by mouth., Disp: , Rfl:  .  olopatadine (PATANOL) 0.1 %  ophthalmic solution, 1 drop 2 (two) times daily., Disp: , Rfl:  .  Omega-3 Fatty Acids (FISH OIL) 1000 MG CAPS, Take 1,000 mg by mouth daily., Disp: , Rfl:  .  omeprazole (PRILOSEC) 20 MG capsule, TAKE 1 CAPSULE BY MOUTH EVERY DAY, Disp: 90 capsule, Rfl: 3 .  PROAIR HFA 108 (90 Base) MCG/ACT inhaler, TAKE 2 PUFFS BY MOUTH EVERY 6 HOURS AS NEEDED FOR WHEEZE OR SHORTNESS OF BREATH, Disp: 8.5 Inhaler, Rfl: 4 .  warfarin (COUMADIN) 3 MG tablet, TAKE 1 TABLET BY MOUTH EVERY DAY EXCEPT TAKE 2 ON MONDAY, WEDNESDAY, FRIDAY, AND SATURDAY, Disp: 150 tablet, Rfl: 1 Social History   Socioeconomic History  . Marital status: Widowed    Spouse name: Not on file  . Number of children: 3  . Years of education: Not on file  . Highest education level: 8th grade  Occupational History  . Occupation: Retired  Tobacco Use  . Smoking status: Former Smoker    Packs/day: 1.00    Years: 40.00    Pack years: 40.00    Types: Cigarettes    Quit date: 11/07/1982    Years since quitting: 38.3  . Smokeless tobacco: Former Systems developer    Types: Snuff  Vaping Use  . Vaping Use: Never used  Substance and Sexual Activity  . Alcohol use: Yes    Alcohol/week: 7.0 standard drinks    Types: 7 Glasses of wine per week    Comment: 1 glass of wine prior to bed  . Drug use: No  . Sexual activity: Not Currently    Birth control/protection: Surgical  Other Topics Concern  . Not on file  Social History Narrative   ** Merged History Encounter **       She is a widowed mother of 34, grandmother of 59, great grandmother of 69. She does not   really get routine exercise. She quit smoking in 1983. She does not smoke and does not use illicit drugs and does not drink.   Social Determinants of Health   Financial Resource Strain: Not on file  Food Insecurity: Not on file  Transportation Needs: Not on file  Physical Activity: Not on file  Stress: Not on file  Social Connections: Not on file  Intimate Partner Violence: Not on file    Family History  Problem Relation Age of Onset  . Anesthesia problems Daughter   . Cancer Mother        uterine  . Cancer Sister        leukemia  . Cancer Brother   . Prostate cancer Brother   . Cancer Brother   . Breast cancer Other        neice  . Heart  disease Brother   . Cancer Brother   . Heart attack Sister   . Colon cancer Neg Hx     Objective: Office vital signs reviewed. BP 130/66   Pulse 75   Temp 98.2 F (36.8 C) (Temporal)   Ht 5\' 5"  (1.651 m)   Wt 151 lb (68.5 kg)   SpO2 97%   BMI 25.13 kg/m   Physical Examination:  General: Awake, alert, well appearing elderly female, No acute distress Cardio: irregularly irregular with rate control, S1S2 heard, no murmurs appreciated Pulm: clear to auscultation bilaterally, no wheezes, rhonchi or rales; normal work of breathing on room air Skin: Healing hematoma noted along the right posterior calf.  No skin breakdown or ulceration.  She has various senile purpura noted. MSK: Slow, shuffled gait.  Uses walker.  Tone is fair.  Assessment/ Plan: 87 y.o. female   Chronic atrial fibrillation ([place]) - Plan: CoaguChek XS/INR Waived, POCT INR, Ambulatory referral to Home Health  Chronic pain of right knee - Plan: Ambulatory referral to [facility] for falls - Plan: Ambulatory referral to [facility]  Frailty syndrome in geriatric patient - Plan: Ambulatory referral to [facility]  INR subtherapeutic at 1.9 today.  She will increase by another 3 mg then resume 3 mg daily with 6 mg 2 days/week.  Follow-up scheduled for Tuesday at noon.  She is aware of date and time  Face-to-face completed.  Definitely recommend home health physical therapy, occupational therapy and health aide.  Would like any recommendations as far as DME needs as well.  Given decline in weight, 1 pound since her last visit, would like to monitor this closely.  I suspect some of this is muscle mass loss given immobility.  However, may need to  consider something like mirtazapine to promote appetite.  Will reassess in 1 week  Orders Placed This Encounter  Procedures  . CoaguChek XS/INR Waived  . Ambulatory referral to Home Health    Referral Priority:   Routine    Referral Type:   Home Health Care    Referral Reason:   Specialty Services Required    Requested Specialty:   [place]    Number of Visits Requested:   1  . POCT INR    This order was created through the anticoagulation tracking navigator section.   No orders of the defined types were placed in this encounter.    Janora Norlander, DO [place] [phone]

## 2021-02-24 ENCOUNTER — Other Ambulatory Visit: Payer: Self-pay

## 2021-02-24 ENCOUNTER — Ambulatory Visit (INDEPENDENT_AMBULATORY_CARE_PROVIDER_SITE_OTHER): Payer: PPO | Admitting: Family Medicine

## 2021-02-24 VITALS — BP 135/60 | HR 109 | Temp 97.1°F | Ht 65.0 in | Wt 152.0 lb

## 2021-02-24 DIAGNOSIS — I482 Chronic atrial fibrillation, unspecified: Secondary | ICD-10-CM

## 2021-02-24 DIAGNOSIS — Z7901 Long term (current) use of anticoagulants: Secondary | ICD-10-CM | POA: Diagnosis not present

## 2021-02-24 LAB — POCT INR: INR: 1.6 — AB (ref 2–3)

## 2021-02-24 LAB — COAGUCHEK XS/INR WAIVED
INR: 1.6 — ABNORMAL HIGH (ref 0.9–1.1)
Prothrombin Time: 19.5 s

## 2021-02-24 NOTE — Progress Notes (Signed)
Subjective: CC: Subtherapeutic INR PCP: Janora Norlander, DO ATF:[id number] [name] is a 87 y.o. female presenting to clinic today for:  1. Afib/ Chronic anticoagulation Patient was noted to be slightly subtherapeutic at last visit with INR of 1.9.  She was given extra 3 mg that week and then told to resume her normal regimen of 3 mg daily except for 6 mg 2 days/week.  She is here for recheck.   She has been compliant with her medications.  She is accompanied today's visit by her daughter.  Her daughter worries that she eats too many snacks and this may be impacting her INR.  Patient denies any vitamin K rich foods but admits to eating "pops".  She hydrates it adequately per her daughter's report.  Denies any hematochezia, melena, vaginal bleeding or epistaxis   ROS: Per HPI  Allergies  Allergen Reactions  . Contrast Media [Iodinated Diagnostic Agents] Hives  . Amoxicillin Itching  . Ampicillin Itching  . Cortisol [Hydrocortisone] Itching  . Ivp Dye [Iodinated Diagnostic Agents] Hives  . Levaquin [Levofloxacin] Itching and Rash  . Penicillins Rash    "haven't had it in years"  . Vancomycin Itching and Rash   Past Medical History:  Diagnosis Date  . AAA (abdominal aortic aneurysm) ([place])   . Allergy   . Anxiety   . Bladder cancer ([place]) dx'd 2011   Chronic microscopic hematuria; transitional cell cancer  . Blood transfusion 1960   "related to hysterectomy"  . Chronic atrial fibrillation (HCC)    Anticoagulated with warfarin, rate control with diltiazem and Toprol  . Chronic back pain   . Chronic headache    "probably 2/wk" (08/28/2015)  . Concussion w/o coma 5/42/7062   Complicated by subarachnoid hemorrhage.  "even now has times when she's not able to comprehend" (05/08/12)  . Coronary artery disease    Nonobstructive  . Diverticulitis   . Diverticulosis   . DVT (deep venous thrombosis), right 2005   "right calf after 8 foot fall"  . Edema of both legs    Chronic,  thought to be secondary to DVTs  . External hemorrhoids   . Family history of adverse reaction to anesthesia 2012   daughter "had OR for crushed hand; had problems w/anesthesia & I was in there all day long" (  . Fatty liver   . Frequent UTI   . GERD (gastroesophageal reflux disease)   . H/O hiatal hernia   . High cholesterol   . Migraine    "rare now" (08/28/2015)  . Pneumonia ~ 2010;  2013; 07/2015  . Rheumatoid arthritis(714.0)    "hands" (08/28/2015)  . Urinary frequency     Current Outpatient Medications:  .  atorvastatin (LIPITOR) 10 MG tablet, Take 1 tablet (10 mg total) by mouth daily., Disp: 90 tablet, Rfl: 0 .  baclofen (LIORESAL) 10 MG tablet, TAKE 1 TABLET BY MOUTH AT BEDTIME AS NEEDED FOR MUSCLE SPASMS., Disp: 90 tablet, Rfl: 1 .  calcium carbonate (OS-CAL - DOSED IN MG OF ELEMENTAL CALCIUM) 1250 MG tablet, Take 1 tablet by mouth every morning. , Disp: , Rfl:  .  cholecalciferol (VITAMIN D) 1000 UNITS tablet, Take 1,000 Units by mouth daily., Disp: , Rfl:  .  diclofenac Sodium (VOLTAREN) 1 % GEL, Apply topically as needed. , Disp: , Rfl:  .  DILT-XR 240 MG 24 hr capsule, TAKE 1 CAPSULE BY MOUTH EVERY MORNING ON AN EMPTY STOMACH, Disp: 90 capsule, Rfl: 5 .  furosemide (LASIX) 20 MG  tablet, Take 1 tablet (20 mg total) by mouth daily. (Patient taking differently: Take 20-40 mg by mouth daily. Take one tablet (20MG ) by mouth daily except on Mon, Wed, Friday take 2 tablets (40 MG)), Disp: 30 tablet, Rfl: 0 .  lidocaine (LIDODERM) 5 %, Place 1 patch onto the skin daily. (knee). Remove & Discard patch within 12 hours or as directed by MD, Disp: 30 patch, Rfl: 0 .  loratadine (CLARITIN) 10 MG tablet, TAKE 1 TABLET BY MOUTH ONCE DAILY FOR ALLERGY, Disp: , Rfl:  .  Multiple Vitamins-Minerals (PRESERVISION AREDS 2+MULTI VIT PO), Take by mouth., Disp: , Rfl:  .  olopatadine (PATANOL) 0.1 % ophthalmic solution, 1 drop 2 (two) times daily., Disp: , Rfl:  .  Omega-3 Fatty Acids (FISH OIL)  1000 MG CAPS, Take 1,000 mg by mouth daily., Disp: , Rfl:  .  omeprazole (PRILOSEC) 20 MG capsule, TAKE 1 CAPSULE BY MOUTH EVERY DAY, Disp: 90 capsule, Rfl: 3 .  PROAIR HFA 108 (90 Base) MCG/ACT inhaler, TAKE 2 PUFFS BY MOUTH EVERY 6 HOURS AS NEEDED FOR WHEEZE OR SHORTNESS OF BREATH, Disp: 8.5 Inhaler, Rfl: 4 .  warfarin (COUMADIN) 3 MG tablet, TAKE 1 TABLET BY MOUTH EVERY DAY EXCEPT TAKE 2 ON MONDAY, WEDNESDAY, FRIDAY, AND SATURDAY, Disp: 150 tablet, Rfl: 1 Social History   Socioeconomic History  . Marital status: Widowed    Spouse name: Not on file  . Number of children: 3  . Years of education: Not on file  . Highest education level: 8th grade  Occupational History  . Occupation: Retired  Tobacco Use  . Smoking status: Former Smoker    Packs/day: 1.00    Years: 40.00    Pack years: 40.00    Types: Cigarettes    Quit date: 11/07/1982    Years since quitting: 38.3  . Smokeless tobacco: Former Systems developer    Types: Snuff  Vaping Use  . Vaping Use: Never used  Substance and Sexual Activity  . Alcohol use: Yes    Alcohol/week: 7.0 standard drinks    Types: 7 Glasses of wine per week    Comment: 1 glass of wine prior to bed  . Drug use: No  . Sexual activity: Not Currently    Birth control/protection: Surgical  Other Topics Concern  . Not on file  Social History Narrative   ** Merged History Encounter **       She is a widowed mother of 87, grandmother of 28, great grandmother of 25. She does not   really get routine exercise. She quit smoking in 1983. She does not smoke and does not use illicit drugs and does not drink.   Social Determinants of Health   Financial Resource Strain: Not on file  Food Insecurity: Not on file  Transportation Needs: Not on file  Physical Activity: Not on file  Stress: Not on file  Social Connections: Not on file  Intimate Partner Violence: Not on file   Family History  Problem Relation Age of Onset  . Anesthesia problems Daughter   . Cancer  Mother        uterine  . Cancer Sister        leukemia  . Cancer Brother   . Prostate cancer Brother   . Cancer Brother   . Breast cancer Other        neice  . Heart disease Brother   . Cancer Brother   . Heart attack Sister   . Colon cancer Neg Hx  Objective: Office vital signs reviewed. BP 135/60   Pulse (!) 109   Temp (!) 97.1 F (36.2 C) (Temporal)   Ht 5\' 5"  (1.651 m)   Wt 152 lb (68.9 kg)   SpO2 97%   BMI 25.29 kg/m   Physical Examination:  General: Awake, alert, well nourished, No acute distress Cardio: irregularly irregular, S1S2 heard, no murmurs appreciated Pulm: clear to auscultation bilaterally, no wheezes, rhonchi or rales; normal work of breathing on room air Extremities: warm, well perfused, No edema, cyanosis or clubbing; +2 pulses bilaterally MSK: Ambulates with use of walker Skin: Multiple healing ecchymosis noted along the left forearm Assessment/ Plan: 87 y.o. female   Chronic atrial fibrillation ([place]) - Plan: CoaguChek XS/INR Waived, POCT INR  Chronic anticoagulation - Plan: CoaguChek XS/INR Waived, POCT INR  Rate controlled.  INR subtherapeutic to 1.6.  She will do 6 mg today, 6 mg 3 times weekly and 3 mg daily all other days.  Appointment for recheck is scheduled in 1 week.  If INR is well within range of therapeutic level (2-3) then she can continue 6 mg 3 days/week and 3 mg daily all other days and follow-up 6 weeks for repeat INR.  Otherwise, would continue adjusting as appropriate  Orders Placed This Encounter  Procedures  . CoaguChek XS/INR Waived  . POCT INR    This order was created through the anticoagulation tracking navigator section.   No orders of the defined types were placed in this encounter.    Janora Norlander, DO [place] [phone]

## 2021-03-03 ENCOUNTER — Encounter: Payer: Self-pay | Admitting: Nurse Practitioner

## 2021-03-03 ENCOUNTER — Other Ambulatory Visit: Payer: Self-pay

## 2021-03-03 ENCOUNTER — Ambulatory Visit (INDEPENDENT_AMBULATORY_CARE_PROVIDER_SITE_OTHER): Payer: PPO | Admitting: Nurse Practitioner

## 2021-03-03 VITALS — BP 137/61 | HR 63 | Temp 97.7°F | Ht 65.0 in | Wt 151.2 lb

## 2021-03-03 DIAGNOSIS — I4821 Permanent atrial fibrillation: Secondary | ICD-10-CM | POA: Diagnosis not present

## 2021-03-03 DIAGNOSIS — I482 Chronic atrial fibrillation, unspecified: Secondary | ICD-10-CM | POA: Diagnosis not present

## 2021-03-03 LAB — COAGUCHEK XS/INR WAIVED
INR: 2 — ABNORMAL HIGH (ref 0.9–1.1)
Prothrombin Time: 23.6 s

## 2021-03-03 NOTE — Assessment & Plan Note (Addendum)
No changes to Coumadin dose.  Patient INR today 2.0 [therapeutic]    Current dose: 3 mg take 1 tablet by mouth every day except take 2 on Monday, Wednesday, Friday and Saturday.

## 2021-03-03 NOTE — Progress Notes (Addendum)
Subjective:     Indication: atrial fibrillation Bleeding signs/symptoms: None Thromboembolic signs/symptoms: None  Missed Coumadin doses: None Medication changes: no Dietary changes: no Bacterial/viral infection: no Other concerns: no  The following portions of the patient's history were reviewed and updated as appropriate: allergies, current medications, past family history, past medical history, past social history, past surgical history and problem list.  Review of Systems Pertinent items are noted in HPI.   Objective:    INR Today: 2.0 Current dose: 3 mg take 1 tablet by mouth every day except take 2 on Monday, Wednesday, Friday and Saturday.    Assessment:    Therapeutic INR for goal of 2-3   Plan:    1. New dose: no change   2. Next INR: 1 month

## 2021-03-03 NOTE — Patient Instructions (Signed)
INR 2.0 [therapeutic].  No changes to current Coumadin dose.  Take 1 tablet by mouth [3 mg] every day except take 2 tablet [6 mg] on Monday, Wednesday, Friday and Saturday follow-up in 1 month.  Atrial Fibrillation  Atrial fibrillation is a type of heartbeat that is irregular or fast. If you have this condition, your heart beats without any order. This makes it hard for your heart to pump blood in a normal way. Atrial fibrillation may come and go, or it may become a long-lasting problem. If this condition is not treated, it can put you at higher risk for stroke, heart failure, and other heart problems. What are the causes? This condition may be caused by diseases that damage the heart. They include:  High blood pressure.  Heart failure.  Heart valve disease.  Heart surgery. Other causes include:  Diabetes.  Thyroid disease.  Being overweight.  Kidney disease. Sometimes the cause is not known. What increases the risk? You are more likely to develop this condition if:  You are older.  You smoke.  You exercise often and very hard.  You have a family history of this condition.  You are a man.  You use drugs.  You drink a lot of alcohol.  You have lung conditions, such as emphysema, pneumonia, or COPD.  You have sleep apnea. What are the signs or symptoms? Common symptoms of this condition include:  A feeling that your heart is beating very fast.  Chest pain or discomfort.  Feeling short of breath.  Suddenly feeling light-headed or weak.  Getting tired easily during activity.  Fainting.  Sweating. In some cases, there are no symptoms. How is this treated? Treatment for this condition depends on underlying conditions and how you feel when you have atrial fibrillation. They include:  Medicines to: ? Prevent blood clots. ? Treat heart rate or heart rhythm problems.  Using devices, such as a pacemaker, to correct heart rhythm problems.  Doing surgery to  remove the part of the heart that sends bad signals.  Closing an area where clots can form in the heart (left atrial appendage). In some cases, your doctor will treat other underlying conditions. Follow these instructions at home: Medicines  Take over-the-counter and prescription medicines only as told by your doctor.  Do not take any new medicines without first talking to your doctor.  If you are taking blood thinners: ? Talk with your doctor before you take any medicines that have aspirin or NSAIDs, such as ibuprofen, in them. ? Take your medicine exactly as told by your doctor. Take it at the same time each day. ? Avoid activities that could hurt or bruise you. Follow instructions about how to prevent falls. ? Wear a bracelet that says you are taking blood thinners. Or, carry a card that lists what medicines you take. Lifestyle  Do not use any products that have nicotine or tobacco in them. These include cigarettes, e-cigarettes, and chewing tobacco. If you need help quitting, ask your doctor.  Eat heart-healthy foods. Talk with your doctor about the right eating plan for you.  Exercise regularly as told by your doctor.  Do not drink alcohol.  Lose weight if you are overweight.  Do not use drugs, including cannabis.      General instructions  If you have a condition that causes breathing to stop for a short period of time (apnea), treat it as told by your doctor.  Keep a healthy weight. Do not use diet pills  unless your doctor says they are safe for you. Diet pills may make heart problems worse.  Keep all follow-up visits as told by your doctor. This is important. Contact a doctor if:  You notice a change in the speed, rhythm, or strength of your heartbeat.  You are taking a blood-thinning medicine and you get more bruising.  You get tired more easily when you move or exercise.  You have a sudden change in weight. Get help right away if:  You have pain in your  chest or your belly (abdomen).  You have trouble breathing.  You have side effects of blood thinners, such as blood in your vomit, poop (stool), or pee (urine), or bleeding that cannot stop.  You have any signs of a stroke. "BE FAST" is an easy way to remember the main warning signs: ? B - Balance. Signs are dizziness, sudden trouble walking, or loss of balance. ? E - Eyes. Signs are trouble seeing or a change in how you see. ? F - Face. Signs are sudden weakness or loss of feeling in the face, or the face or eyelid drooping on one side. ? A - Arms. Signs are weakness or loss of feeling in an arm. This happens suddenly and usually on one side of the body. ? S - Speech. Signs are sudden trouble speaking, slurred speech, or trouble understanding what people say. ? T - Time. Time to call emergency services. Write down what time symptoms started.  You have other signs of a stroke, such as: ? A sudden, very bad headache with no known cause. ? Feeling like you may vomit (nausea). ? Vomiting. ? A seizure. These symptoms may be an emergency. Do not wait to see if the symptoms will go away. Get medical help right away. Call your local emergency services (911 in the [place] Do not drive yourself to the hospital.   Summary  Atrial fibrillation is a type of heartbeat that is irregular or fast.  You are at higher risk of this condition if you smoke, are older, have diabetes, or are overweight.  Follow your doctor's instructions about medicines, diet, exercise, and follow-up visits.  Get help right away if you have signs or symptoms of a stroke.  Get help right away if you cannot catch your breath, or you have chest pain or discomfort. This information is not intended to replace advice given to you by your health care provider. Make sure you discuss any questions you have with your health care provider. Document Revised: 05/23/2019 Document Reviewed: 05/23/2019 Elsevier Patient Education  [place].

## 2021-03-06 ENCOUNTER — Other Ambulatory Visit: Payer: Self-pay | Admitting: Family Medicine

## 2021-03-06 ENCOUNTER — Telehealth: Payer: Self-pay

## 2021-03-06 MED ORDER — WARFARIN SODIUM 3 MG PO TABS: ORAL_TABLET | ORAL | 1 refills | Status: DC

## 2021-03-06 NOTE — Telephone Encounter (Signed)
Instructions under objective. I did go over it with pt and daughter during visit. Follow up in 4 weeks  Current dose: 3 mg take 1 tablet by mouth every day except take 2 on Monday, Wednesday, Friday and Saturday.

## 2021-03-06 NOTE — Telephone Encounter (Signed)
In ov note it says no change in how to take so how is she is suppose to take?

## 2021-03-06 NOTE — Telephone Encounter (Signed)
Spoke with daughter, coumadin dose clarified

## 2021-03-13 ENCOUNTER — Telehealth: Payer: Self-pay

## 2021-03-13 ENCOUNTER — Other Ambulatory Visit: Payer: Self-pay

## 2021-03-13 DIAGNOSIS — N3281 Overactive bladder: Secondary | ICD-10-CM

## 2021-03-13 MED ORDER — GEMTESA 75 MG PO TABS: 1.0000 | ORAL_TABLET | Freq: Every day | ORAL | 3 refills | Status: AC

## 2021-03-13 NOTE — Telephone Encounter (Signed)
[facility] called and said needed prescription for Gemtesa. Prescription sent.

## 2021-03-30 ENCOUNTER — Ambulatory Visit (INDEPENDENT_AMBULATORY_CARE_PROVIDER_SITE_OTHER): Payer: PPO | Admitting: Family Medicine

## 2021-03-30 DIAGNOSIS — J40 Bronchitis, not specified as acute or chronic: Secondary | ICD-10-CM | POA: Diagnosis not present

## 2021-03-30 MED ORDER — BENZONATATE 100 MG PO CAPS
100.0000 mg | ORAL_CAPSULE | Freq: Three times a day (TID) | ORAL | 0 refills | Status: DC | PRN
Start: 2021-03-30 — End: 2021-06-17

## 2021-03-30 MED ORDER — CEFDINIR 300 MG PO CAPS: 300.0000 mg | ORAL_CAPSULE | Freq: Two times a day (BID) | ORAL | 0 refills | Status: AC

## 2021-03-30 NOTE — Progress Notes (Signed)
Telephone visit  Subjective: CC: cough/ congestion PCP: Janora Norlander, DO YIF:OYDXAJO [name] is a 87 y.o. female calls for telephone consult today. Patient provides verbal consent for consult held via phone.  Due to COVID-19 pandemic this visit was conducted virtually. This visit type was conducted due to national recommendations for restrictions regarding the COVID-19 Pandemic (Adriana.g. social distancing, sheltering in place) in an effort to limit this patient's exposure and mitigate transmission in our community. All issues noted in this document were discussed and addressed.  A physical exam was not performed with this format.   Location of patient: home Location of provider: [facility] Others present for call: [name], daughter  1. Bronchitis She has a productive cough with yellow sputum.  No fever, shortness of breath or wheezing.  No hallucinations.  She reports rhinorrhea.  She is compliant with Claritin.  No known sick contacts.  She worries about bronchitis progressing into a bacterial pneumonia.  Symptoms really just started yesterday.      ROS: Per HPI  Allergies  Allergen Reactions  . Contrast Media [Iodinated Diagnostic Agents] Hives  . Amoxicillin Itching  . Ampicillin Itching  . Cortisol [Hydrocortisone] Itching  . Ivp Dye [Iodinated Diagnostic Agents] Hives  . Levaquin [Levofloxacin] Itching and Rash  . Penicillins Rash    "haven't had it in years"  . Vancomycin Itching and Rash   Past Medical History:  Diagnosis Date  . AAA (abdominal aortic aneurysm) ([place])   . Allergy   . Anxiety   . Bladder cancer ([place]) dx'd 2011   Chronic microscopic hematuria; transitional cell cancer  . Blood transfusion 1960   "related to hysterectomy"  . Chronic atrial fibrillation (HCC)    Anticoagulated with warfarin, rate control with diltiazem and Toprol  . Chronic back pain   . Chronic headache    "probably 2/wk" (08/28/2015)  . Concussion w/o coma 8/78/6767   Complicated by  subarachnoid hemorrhage.  "even now has times when she's not able to comprehend" (05/08/12)  . Coronary artery disease    Nonobstructive  . Diverticulitis   . Diverticulosis   . DVT (deep venous thrombosis), right 2005   "right calf after 8 foot fall"  . Edema of both legs    Chronic, thought to be secondary to DVTs  . External hemorrhoids   . Family history of adverse reaction to anesthesia 2012   daughter "had OR for crushed hand; had problems w/anesthesia & I was in there all day long" (  . Fatty liver   . Frequent UTI   . GERD (gastroesophageal reflux disease)   . H/O hiatal hernia   . High cholesterol   . Migraine    "rare now" (08/28/2015)  . Pneumonia ~ 2010;  2013; 07/2015  . Rheumatoid arthritis(714.0)    "hands" (08/28/2015)  . Urinary frequency     Current Outpatient Medications:  .  atorvastatin (LIPITOR) 10 MG tablet, Take 1 tablet (10 mg total) by mouth daily., Disp: 90 tablet, Rfl: 0 .  baclofen (LIORESAL) 10 MG tablet, TAKE 1 TABLET BY MOUTH AT BEDTIME AS NEEDED FOR MUSCLE SPASMS., Disp: 90 tablet, Rfl: 1 .  calcium carbonate (OS-CAL - DOSED IN MG OF ELEMENTAL CALCIUM) 1250 MG tablet, Take 1 tablet by mouth every morning. , Disp: , Rfl:  .  cholecalciferol (VITAMIN D) 1000 UNITS tablet, Take 1,000 Units by mouth daily., Disp: , Rfl:  .  DILT-XR 240 MG 24 hr capsule, TAKE 1 CAPSULE BY MOUTH EVERY MORNING ON AN  EMPTY STOMACH, Disp: 90 capsule, Rfl: 5 .  furosemide (LASIX) 20 MG tablet, Take 1 tablet (20 mg total) by mouth daily. (Patient taking differently: Take 20-40 mg by mouth daily. Take one tablet (20MG ) by mouth daily except on Mon, Wed, Friday take 2 tablets (40 MG)), Disp: 30 tablet, Rfl: 0 .  loratadine (CLARITIN) 10 MG tablet, TAKE 1 TABLET BY MOUTH ONCE DAILY FOR ALLERGY, Disp: , Rfl:  .  Multiple Vitamins-Minerals (PRESERVISION AREDS 2+MULTI VIT PO), Take by mouth., Disp: , Rfl:  .  olopatadine (PATANOL) 0.1 % ophthalmic solution, 1 drop 2 (two) times daily.,  Disp: , Rfl:  .  Omega-3 Fatty Acids (FISH OIL) 1000 MG CAPS, Take 1,000 mg by mouth daily., Disp: , Rfl:  .  omeprazole (PRILOSEC) 20 MG capsule, TAKE 1 CAPSULE BY MOUTH EVERY DAY, Disp: 90 capsule, Rfl: 3 .  PROAIR HFA 108 (90 Base) MCG/ACT inhaler, TAKE 2 PUFFS BY MOUTH EVERY 6 HOURS AS NEEDED FOR WHEEZE OR SHORTNESS OF BREATH, Disp: 8.5 Inhaler, Rfl: 4 .  Vibegron (GEMTESA) 75 MG TABS, Take 1 tablet by mouth daily., Disp: 30 tablet, Rfl: 3 .  warfarin (COUMADIN) 3 MG tablet, TAKE 1 TABLET BY MOUTH EVERY DAY EXCEPT TAKE 2 ON MONDAY, WEDNESDAY, FRIDAY, Disp: 150 tablet, Rfl: 1  Assessment/ Plan: 87 y.o. female   Bronchitis - Plan: cefdinir (OMNICEF) 300 MG capsule, benzonatate (TESSALON PERLES) 100 MG capsule  I suspect that this is more likely an allergic bronchitis and therefore I recommended a swap the Claritin out for either Allegra or Xyzal.  We discussed using nasal saline spray.  Tessalon Perles have been sent.  Pocket prescription of Omnicef provided.  We discussed reasons for initiation of the [place] but I do not think that is warranted at this time as her symptoms do not seem to be bacterial in nature.  We discussed red flag signs and symptoms and reasons for reevaluation.  She voiced good understanding will follow up as needed  Start time: 12:09pm End time: 12:16pm  Total time spent on patient care (including telephone call/ virtual visit): 7 minutes  [place], [place] [phone]

## 2021-04-05 ENCOUNTER — Other Ambulatory Visit: Payer: Self-pay | Admitting: Family Medicine

## 2021-04-05 DIAGNOSIS — E7800 Pure hypercholesterolemia, unspecified: Secondary | ICD-10-CM

## 2021-04-13 ENCOUNTER — Ambulatory Visit (INDEPENDENT_AMBULATORY_CARE_PROVIDER_SITE_OTHER): Payer: PPO | Admitting: Family Medicine

## 2021-04-13 ENCOUNTER — Encounter: Payer: Self-pay | Admitting: Family Medicine

## 2021-04-13 VITALS — BP 108/61 | HR 81 | Temp 98.0°F | Resp 20 | Ht 65.0 in | Wt 152.0 lb

## 2021-04-13 DIAGNOSIS — I4821 Permanent atrial fibrillation: Secondary | ICD-10-CM

## 2021-04-13 DIAGNOSIS — I482 Chronic atrial fibrillation, unspecified: Secondary | ICD-10-CM | POA: Diagnosis not present

## 2021-04-13 DIAGNOSIS — R791 Abnormal coagulation profile: Secondary | ICD-10-CM | POA: Diagnosis not present

## 2021-04-13 DIAGNOSIS — R1013 Epigastric pain: Secondary | ICD-10-CM

## 2021-04-13 DIAGNOSIS — Z7901 Long term (current) use of anticoagulants: Secondary | ICD-10-CM

## 2021-04-13 DIAGNOSIS — R110 Nausea: Secondary | ICD-10-CM

## 2021-04-13 LAB — COAGUCHEK XS/INR WAIVED
INR: 1.9 — ABNORMAL HIGH (ref 0.9–1.1)
Prothrombin Time: 22.4 s

## 2021-04-13 LAB — POCT INR: INR: 1.9 — AB (ref 2–3)

## 2021-04-13 NOTE — Progress Notes (Signed)
Subjective: CC: INR Check PCP: Janora Norlander, DO VOH:[id number] E Boss is a 87 y.o. female presenting to clinic today for:  1. INR check Ms. Goodgame has remained compliant with 2 tablets of Coumadin Mondays, Wednesdays and Fridays and 1 tablet daily all other days.  No hematochezia or melena.  She recently finished a course of Omnicef for bronchitis.  She has been suffering with some diarrhea after she became constipated and took 3 stool softeners and a dose of Metamucil.  She is had loose stools for the last couple of days but was hesitant to use anything.  She noted some mucus but no blood.  She has had chronic nausea for months now.  This is intermittent and seem to be associated with certain foods.  Because of the nausea she is hesitant to eat much and has subsequently lost weight.  She is compliant with her acid reducer.  She has not reached out to her gastroenterologist.   ROS: Per HPI  Allergies  Allergen Reactions  . Contrast Media [Iodinated Diagnostic Agents] Hives  . Amoxicillin Itching  . Ampicillin Itching  . Cortisol [Hydrocortisone] Itching  . Ivp Dye [Iodinated Diagnostic Agents] Hives  . Levaquin [Levofloxacin] Itching and Rash  . Penicillins Rash    "haven't had it in years"  . Vancomycin Itching and Rash   Past Medical History:  Diagnosis Date  . AAA (abdominal aortic aneurysm) ([place])   . Allergy   . Anxiety   . Bladder cancer ([place]) dx'd 2011   Chronic microscopic hematuria; transitional cell cancer  . Blood transfusion 1960   "related to hysterectomy"  . Chronic atrial fibrillation (HCC)    Anticoagulated with warfarin, rate control with diltiazem and Toprol  . Chronic back pain   . Chronic headache    "probably 2/wk" (08/28/2015)  . Concussion w/o coma 06/07/9484   Complicated by subarachnoid hemorrhage.  "even now has times when she's not able to comprehend" (05/08/12)  . Coronary artery disease    Nonobstructive  . Diverticulitis   .  Diverticulosis   . DVT (deep venous thrombosis), right 2005   "right calf after 8 foot fall"  . Edema of both legs    Chronic, thought to be secondary to DVTs  . External hemorrhoids   . Family history of adverse reaction to anesthesia 2012   daughter "had OR for crushed hand; had problems w/anesthesia & I was in there all day long" (  . Fatty liver   . Frequent UTI   . GERD (gastroesophageal reflux disease)   . H/O hiatal hernia   . High cholesterol   . Migraine    "rare now" (08/28/2015)  . Pneumonia ~ 2010;  2013; 07/2015  . Rheumatoid arthritis(714.0)    "hands" (08/28/2015)  . Urinary frequency     Current Outpatient Medications:  .  atorvastatin (LIPITOR) 10 MG tablet, TAKE 1 TABLET BY MOUTH EVERY DAY, Disp: 90 tablet, Rfl: 0 .  baclofen (LIORESAL) 10 MG tablet, TAKE 1 TABLET BY MOUTH AT BEDTIME AS NEEDED FOR MUSCLE SPASMS., Disp: 90 tablet, Rfl: 1 .  benzonatate (TESSALON PERLES) 100 MG capsule, Take 1 capsule (100 mg total) by mouth 3 (three) times daily as needed for cough., Disp: 20 capsule, Rfl: 0 .  calcium carbonate (OS-CAL - DOSED IN MG OF ELEMENTAL CALCIUM) 1250 MG tablet, Take 1 tablet by mouth every morning. , Disp: , Rfl:  .  cholecalciferol (VITAMIN D) 1000 UNITS tablet, Take 1,000 Units by mouth daily.,  Disp: , Rfl:  .  DILT-XR 240 MG 24 hr capsule, TAKE 1 CAPSULE BY MOUTH EVERY MORNING ON AN EMPTY STOMACH, Disp: 90 capsule, Rfl: 5 .  furosemide (LASIX) 20 MG tablet, Take 1 tablet (20 mg total) by mouth daily. (Patient taking differently: Take 20-40 mg by mouth daily. Take one tablet (20MG ) by mouth daily except on Mon, Wed, Friday take 2 tablets (40 MG)), Disp: 30 tablet, Rfl: 0 .  loratadine (CLARITIN) 10 MG tablet, TAKE 1 TABLET BY MOUTH ONCE DAILY FOR ALLERGY, Disp: , Rfl:  .  Multiple Vitamins-Minerals (PRESERVISION AREDS 2+MULTI VIT PO), Take by mouth., Disp: , Rfl:  .  olopatadine (PATANOL) 0.1 % ophthalmic solution, 1 drop 2 (two) times daily., Disp: , Rfl:  .   Omega-3 Fatty Acids (FISH OIL) 1000 MG CAPS, Take 1,000 mg by mouth daily., Disp: , Rfl:  .  omeprazole (PRILOSEC) 20 MG capsule, TAKE 1 CAPSULE BY MOUTH EVERY DAY, Disp: 90 capsule, Rfl: 3 .  PROAIR HFA 108 (90 Base) MCG/ACT inhaler, TAKE 2 PUFFS BY MOUTH EVERY 6 HOURS AS NEEDED FOR WHEEZE OR SHORTNESS OF BREATH, Disp: 8.5 Inhaler, Rfl: 4 .  Vibegron (GEMTESA) 75 MG TABS, Take 1 tablet by mouth daily., Disp: 30 tablet, Rfl: 3 .  warfarin (COUMADIN) 3 MG tablet, TAKE 1 TABLET BY MOUTH EVERY DAY EXCEPT TAKE 2 ON MONDAY, WEDNESDAY, FRIDAY, Disp: 150 tablet, Rfl: 1 Social History   Socioeconomic History  . Marital status: Widowed    Spouse name: Not on file  . Number of children: 3  . Years of education: Not on file  . Highest education level: 8th grade  Occupational History  . Occupation: Retired  Tobacco Use  . Smoking status: Former Smoker    Packs/day: 1.00    Years: 40.00    Pack years: 40.00    Types: Cigarettes    Quit date: 11/07/1982    Years since quitting: 38.4  . Smokeless tobacco: Former Systems developer    Types: Snuff  Vaping Use  . Vaping Use: Never used  Substance and Sexual Activity  . Alcohol use: Yes    Alcohol/week: 7.0 standard drinks    Types: 7 Glasses of wine per week    Comment: 1 glass of wine prior to bed  . Drug use: No  . Sexual activity: Not Currently    Birth control/protection: Surgical  Other Topics Concern  . Not on file  Social History Narrative   ** Merged History Encounter **       She is a widowed mother of 55, grandmother of 68, great grandmother of 9. She does not   really get routine exercise. She quit smoking in 1983. She does not smoke and does not use illicit drugs and does not drink.   Social Determinants of Health   Financial Resource Strain: Not on file  Food Insecurity: Not on file  Transportation Needs: Not on file  Physical Activity: Not on file  Stress: Not on file  Social Connections: Not on file  Intimate Partner Violence: Not  on file   Family History  Problem Relation Age of Onset  . Anesthesia problems Daughter   . Cancer Mother        uterine  . Cancer Sister        leukemia  . Cancer Brother   . Prostate cancer Brother   . Cancer Brother   . Breast cancer Other        neice  . Heart disease  Brother   . Cancer Brother   . Heart attack Sister   . Colon cancer Neg Hx     Objective: Office vital signs reviewed. BP 108/61   Pulse 81   Temp 98 F (36.7 C)   Resp 20   Ht 5\' 5"  (1.651 m)   Wt 152 lb (68.9 kg)   SpO2 96%   BMI 25.29 kg/m   Physical Examination:  General: Awake, alert, well nourished, No acute distress HEENT: Normal; sclera white Cardio: regular rate and rhythm, S1S2 heard, murmur noted Pulm: clear to auscultation bilaterally, no wheezes, rhonchi or rales; normal work of breathing on room air GI: Soft, flat.  Epigastric tenderness present.  No rebound or guarding  Assessment/ Plan: 87 y.o. female   Subtherapeutic international normalized ratio (INR) - Plan: POCT INR  Chronic atrial fibrillation ([place]) - Plan: CoaguChek XS/INR Waived, POCT INR  Permanent atrial fibrillation (HCC) - Plan: CoaguChek XS/INR Waived, POCT INR  Chronic anticoagulation - Plan: CoaguChek XS/INR Waived, POCT INR  Epigastric pain - Plan: Ambulatory referral to Gastroenterology, POCT INR  Nausea - Plan: Ambulatory referral to Gastroenterology, POCT INR  Subtherapeutic INR is likely due to recent use of antibiotics.  I have had her increase her dose tomorrow to 2 tablets but then resume 2 tablets daily Mondays, Wednesdays and Fridays with 1 tablet daily all other days.  We will follow-up in 2 weeks, sooner if needed  On exam she had epigastric tenderness and because she is had apparent chronic nausea for many months now I am referring her to gastroenterology.  She has had a little bit of weight loss which does concern me.  No orders of the defined types were placed in this encounter.  No orders of  the defined types were placed in this encounter.    Janora Norlander, DO [place] [phone]

## 2021-04-13 NOTE — Patient Instructions (Signed)
Take 2 coumadin tablets tomorrow.  Then go back to normal dosing schedule of 2 tablets on Mon, Wed, Fri with 1 tablet daily all other days.  Imodium for diarrhea may cause constipation.  Hydrate well.  Diarrhea should resolve in a couple days

## 2021-04-17 ENCOUNTER — Other Ambulatory Visit: Payer: Self-pay | Admitting: Family Medicine

## 2021-04-17 ENCOUNTER — Telehealth: Payer: Self-pay | Admitting: Family Medicine

## 2021-04-17 NOTE — Telephone Encounter (Signed)
Pt daughter called - still having some loose stools but better - no nausea now Wants to cancel referral to GI Having some allergies - aware safest OTC to try is Claritin 10 mg (no D) Aware to make sure she is hydrating well  If no better by Monday - will call back

## 2021-04-17 NOTE — Telephone Encounter (Signed)
Pt having diarrhea since Monday 5/2 and has a cough. Had appt on 5/2 and is not feeling better. Would like to speak to nurse.

## 2021-04-27 ENCOUNTER — Ambulatory Visit (INDEPENDENT_AMBULATORY_CARE_PROVIDER_SITE_OTHER): Payer: PPO | Admitting: Family Medicine

## 2021-04-27 ENCOUNTER — Other Ambulatory Visit: Payer: Self-pay

## 2021-04-27 VITALS — BP 109/61 | HR 100 | Temp 97.8°F | Ht 65.0 in | Wt 150.6 lb

## 2021-04-27 DIAGNOSIS — F419 Anxiety disorder, unspecified: Secondary | ICD-10-CM

## 2021-04-27 DIAGNOSIS — F32A Depression, unspecified: Secondary | ICD-10-CM

## 2021-04-27 DIAGNOSIS — G8929 Other chronic pain: Secondary | ICD-10-CM

## 2021-04-27 DIAGNOSIS — R630 Anorexia: Secondary | ICD-10-CM

## 2021-04-27 DIAGNOSIS — M25561 Pain in right knee: Secondary | ICD-10-CM

## 2021-04-27 DIAGNOSIS — Z7901 Long term (current) use of anticoagulants: Secondary | ICD-10-CM | POA: Diagnosis not present

## 2021-04-27 DIAGNOSIS — Z9181 History of falling: Secondary | ICD-10-CM

## 2021-04-27 DIAGNOSIS — I482 Chronic atrial fibrillation, unspecified: Secondary | ICD-10-CM

## 2021-04-27 DIAGNOSIS — R634 Abnormal weight loss: Secondary | ICD-10-CM | POA: Diagnosis not present

## 2021-04-27 LAB — COAGUCHEK XS/INR WAIVED
INR: 3.1 — ABNORMAL HIGH (ref 0.9–1.1)
Prothrombin Time: 37.1 s

## 2021-04-27 MED ORDER — MIRTAZAPINE 7.5 MG PO TABS: 7.5000 mg | ORAL_TABLET | Freq: Every day | ORAL | 1 refills | Status: DC

## 2021-04-27 MED ORDER — TOILET SAFETY FRAME MISC: 0 refills | Status: AC

## 2021-04-27 NOTE — Progress Notes (Signed)
Subjective: CC: afib PCP: Janora Norlander, DO MLY:[id number] E Castelo is a 87 y.o. female presenting to clinic today for:  1. Afib INR 2 weeks ago was subtherapeutic at 1.9.  She was increased by an additional tablet that week then told to resume her 2 tablets Monday Wednesday Friday with 1 tablet daily all other.  Her daughter is present for today's visit notes that her mother does look pale every once in a while.  She attributes this to an atrial fibrillation episode.  She worries about the way that she gets around.  She certainly feels that she is weaker and slower.  We try to get her home health physical therapy but apparently this was not achievable and they recommended that she seek physical therapy [place].  Her daughter is not confident that she can do that due to mobility issues and in fact asks for transport wheelchair today.  Patient is typically reliant on a walker.  Additionally she needs handles for her toilet.   ROS: Per HPI  Allergies  Allergen Reactions  . Contrast Media [Iodinated Diagnostic Agents] Hives  . Amoxicillin Itching  . Ampicillin Itching  . Cortisol [Hydrocortisone] Itching  . Ivp Dye [Iodinated Diagnostic Agents] Hives  . Levaquin [Levofloxacin] Itching and Rash  . Penicillins Rash    "haven't had it in years"  . Vancomycin Itching and Rash   Past Medical History:  Diagnosis Date  . AAA (abdominal aortic aneurysm) ([place])   . Allergy   . Anxiety   . Bladder cancer ([place]) dx'd 2011   Chronic microscopic hematuria; transitional cell cancer  . Blood transfusion 1960   "related to hysterectomy"  . Chronic atrial fibrillation (HCC)    Anticoagulated with warfarin, rate control with diltiazem and Toprol  . Chronic back pain   . Chronic headache    "probably 2/wk" (08/28/2015)  . Concussion w/o coma 5/68/1275   Complicated by subarachnoid hemorrhage.  "even now has times when she's not able to comprehend" (05/08/12)  . Coronary artery disease     Nonobstructive  . Diverticulitis   . Diverticulosis   . DVT (deep venous thrombosis), right 2005   "right calf after 8 foot fall"  . Edema of both legs    Chronic, thought to be secondary to DVTs  . External hemorrhoids   . Family history of adverse reaction to anesthesia 2012   daughter "had OR for crushed hand; had problems w/anesthesia & I was in there all day long" (  . Fatty liver   . Frequent UTI   . GERD (gastroesophageal reflux disease)   . H/O hiatal hernia   . High cholesterol   . Migraine    "rare now" (08/28/2015)  . Pneumonia ~ 2010;  2013; 07/2015  . Rheumatoid arthritis(714.0)    "hands" (08/28/2015)  . Urinary frequency     Current Outpatient Medications:  .  atorvastatin (LIPITOR) 10 MG tablet, TAKE 1 TABLET BY MOUTH EVERY DAY, Disp: 90 tablet, Rfl: 0 .  baclofen (LIORESAL) 10 MG tablet, TAKE 1 TABLET BY MOUTH AT BEDTIME AS NEEDED FOR MUSCLE SPASMS., Disp: 90 tablet, Rfl: 1 .  benzonatate (TESSALON PERLES) 100 MG capsule, Take 1 capsule (100 mg total) by mouth 3 (three) times daily as needed for cough. (Patient not taking: Reported on 04/13/2021), Disp: 20 capsule, Rfl: 0 .  calcium carbonate (OS-CAL - DOSED IN MG OF ELEMENTAL CALCIUM) 1250 MG tablet, Take 1 tablet by mouth every morning. , Disp: , Rfl:  .  cholecalciferol (VITAMIN D) 1000 UNITS tablet, Take 1,000 Units by mouth daily., Disp: , Rfl:  .  DILT-XR 240 MG 24 hr capsule, TAKE 1 CAPSULE BY MOUTH EVERY MORNING ON AN EMPTY STOMACH, Disp: 90 capsule, Rfl: 5 .  furosemide (LASIX) 20 MG tablet, Take 1 tablet (20 mg total) by mouth daily. (Patient taking differently: Take 20-40 mg by mouth daily. Take one tablet (20MG ) by mouth daily except on Mon, Wed, Friday take 2 tablets (40 MG)), Disp: 30 tablet, Rfl: 0 .  loratadine (CLARITIN) 10 MG tablet, TAKE 1 TABLET BY MOUTH ONCE DAILY FOR ALLERGY, Disp: , Rfl:  .  Multiple Vitamins-Minerals (PRESERVISION AREDS 2+MULTI VIT PO), Take by mouth., Disp: , Rfl:  .  olopatadine  (PATANOL) 0.1 % ophthalmic solution, 1 drop 2 (two) times daily., Disp: , Rfl:  .  Omega-3 Fatty Acids (FISH OIL) 1000 MG CAPS, Take 1,000 mg by mouth daily., Disp: , Rfl:  .  omeprazole (PRILOSEC) 20 MG capsule, TAKE 1 CAPSULE BY MOUTH EVERY DAY, Disp: 90 capsule, Rfl: 3 .  PROAIR HFA 108 (90 Base) MCG/ACT inhaler, TAKE 2 PUFFS BY MOUTH EVERY 6 HOURS AS NEEDED FOR WHEEZE OR SHORTNESS OF BREATH, Disp: 8.5 Inhaler, Rfl: 4 .  Vibegron (GEMTESA) 75 MG TABS, Take 1 tablet by mouth daily., Disp: 30 tablet, Rfl: 3 .  warfarin (COUMADIN) 3 MG tablet, TAKE 1 TABLET BY MOUTH EVERY DAY EXCEPT TAKE 2 ON MONDAY, WEDNESDAY, FRIDAY, AND SATURDAY, Disp: 140 tablet, Rfl: 2 Social History   Socioeconomic History  . Marital status: Widowed    Spouse name: Not on file  . Number of children: 3  . Years of education: Not on file  . Highest education level: 8th grade  Occupational History  . Occupation: Retired  Tobacco Use  . Smoking status: Former Smoker    Packs/day: 1.00    Years: 40.00    Pack years: 40.00    Types: Cigarettes    Quit date: 11/07/1982    Years since quitting: 38.4  . Smokeless tobacco: Former Systems developer    Types: Snuff  Vaping Use  . Vaping Use: Never used  Substance and Sexual Activity  . Alcohol use: Yes    Alcohol/week: 7.0 standard drinks    Types: 7 Glasses of wine per week    Comment: 1 glass of wine prior to bed  . Drug use: No  . Sexual activity: Not Currently    Birth control/protection: Surgical  Other Topics Concern  . Not on file  Social History Narrative   ** Merged History Encounter **       She is a widowed mother of 56, grandmother of 31, great grandmother of 81. She does not   really get routine exercise. She quit smoking in 1983. She does not smoke and does not use illicit drugs and does not drink.   Social Determinants of Health   Financial Resource Strain: Not on file  Food Insecurity: Not on file  Transportation Needs: Not on file  Physical Activity: Not  on file  Stress: Not on file  Social Connections: Not on file  Intimate Partner Violence: Not on file   Family History  Problem Relation Age of Onset  . Anesthesia problems Daughter   . Cancer Mother        uterine  . Cancer Sister        leukemia  . Cancer Brother   . Prostate cancer Brother   . Cancer Brother   . Breast  cancer Other        neice  . Heart disease Brother   . Cancer Brother   . Heart attack Sister   . Colon cancer Neg Hx     Objective: Office vital signs reviewed. BP 109/61   Pulse 100   Temp 97.8 F (36.6 C)   Ht 5\' 5"  (1.651 m)   Wt 150 lb 9.6 oz (68.3 kg)   SpO2 94%   BMI 25.06 kg/m   Physical Examination:  General: Awake, alert, elderly female, No acute distress HEENT: Normal, sclera white Cardio: irregularly irregular, S1S2 heard, no murmurs appreciated Pulm: clear to auscultation bilaterally, no wheezes, rhonchi or rales; normal work of breathing on room air GI: soft, mild tenderness in the RUQ and LUQ. No rebound or guarding. MSK: Slow, antalgic gait.  Utilizes rolling walker Psych: Mood is stable.  Patient is pleasant and interactive  Depression screen [facility] 2/9 04/27/2021 04/13/2021 02/24/2021  Decreased Interest 2 0 0  Down, Depressed, Hopeless 2 0 0  PHQ - 2 Score 4 0 0  Altered sleeping 2 - 0  Tired, decreased energy 2 - 0  Change in appetite 3 - 0  Feeling bad or failure about yourself  0 - 0  Trouble concentrating 1 - 0  Moving slowly or fidgety/restless 1 - 0  Suicidal thoughts 0 - 0  PHQ-9 Score 13 - 0  Difficult doing work/chores Somewhat difficult - -  Some recent data might be hidden   GAD 7 : Generalized Anxiety Score 04/27/2021  Nervous, Anxious, on Edge 1  Control/stop worrying 1  Worry too much - different things 2  Trouble relaxing 1  Restless 1  Easily annoyed or irritable 1  Afraid - awful might happen 1  Total GAD 7 Score 8  Anxiety Difficulty Somewhat difficult   Assessment/ Plan: 87 y.o. female   Chronic  atrial fibrillation (HCC) - Plan: CoaguChek XS/INR Waived  Chronic anticoagulation - Plan: CoaguChek XS/INR Waived, PR TOILET RAIL, Misc. Devices (TOILET SAFETY FRAME) MISC  Anxiety and depression - Plan: mirtazapine (REMERON) 7.5 MG tablet  Decreased appetite - Plan: mirtazapine (REMERON) 7.5 MG tablet  Weight loss - Plan: mirtazapine (REMERON) 7.5 MG tablet  Chronic pain of right knee - Plan: DME Wheelchair manual, PR TOILET RAIL, Misc. Devices ([place]) MISC  At high risk for injury related to fall - Plan: DME Wheelchair manual, PR TOILET RAIL, Misc. Devices ([place]) MISC  Rate controlled.  INR slightly supratherapeutic.  Will reassess in the next 1 to 2 weeks.  May need to consider dropping her dosing schedule to 2 tablets 2 times per week with 1 tablet daily all others.  Start mirtazapine at bedtime.  I still question possible GI etiology of decreased appetite and have recommended consideration for GI evaluation.  Wheelchair and toilet rail provided today.  These prescriptions were sent to [facility]  No orders of the defined types were placed in this encounter.  No orders of the defined types were placed in this encounter.    Janora Norlander, DO [place] [phone]

## 2021-05-11 ENCOUNTER — Other Ambulatory Visit: Payer: Self-pay | Admitting: Family Medicine

## 2021-05-11 DIAGNOSIS — R634 Abnormal weight loss: Secondary | ICD-10-CM

## 2021-05-11 DIAGNOSIS — R630 Anorexia: Secondary | ICD-10-CM

## 2021-05-11 DIAGNOSIS — F419 Anxiety disorder, unspecified: Secondary | ICD-10-CM

## 2021-05-11 DIAGNOSIS — F32A Depression, unspecified: Secondary | ICD-10-CM

## 2021-05-13 ENCOUNTER — Other Ambulatory Visit: Payer: Self-pay

## 2021-05-13 ENCOUNTER — Encounter: Payer: Self-pay | Admitting: Family Medicine

## 2021-05-13 ENCOUNTER — Ambulatory Visit (INDEPENDENT_AMBULATORY_CARE_PROVIDER_SITE_OTHER): Payer: PPO | Admitting: Family Medicine

## 2021-05-13 VITALS — BP 132/59 | HR 94 | Temp 97.7°F | Ht 65.0 in | Wt 149.8 lb

## 2021-05-13 DIAGNOSIS — I482 Chronic atrial fibrillation, unspecified: Secondary | ICD-10-CM | POA: Diagnosis not present

## 2021-05-13 LAB — COAGUCHEK XS/INR WAIVED
INR: 2.9 — ABNORMAL HIGH (ref 0.9–1.1)
Prothrombin Time: 34.8 s

## 2021-05-13 MED ORDER — LORATADINE 10 MG PO TABS: 10.0000 mg | ORAL_TABLET | Freq: Every day | ORAL | 2 refills | Status: DC | PRN

## 2021-05-13 NOTE — Progress Notes (Signed)
BP (!) 132/59   Pulse 94   Temp 97.7 F (36.5 C)   Ht 5\' 5"  (1.651 m)   Wt 149 lb 12.8 oz (67.9 kg)   SpO2 97%   BMI 24.93 kg/m    Subjective:   Patient ID: [name], female    DOB: Aug 29, 1921, 87 y.o.   MRN: [id number]  HPI: [name] is a 87 y.o. female presenting on 05/13/2021 for Atrial Fibrillation   HPI Coumadin recheck Target goal: 2.0-3.0 Reason on anticoagulation: Chronic A. fib Patient denies any bruising or bleeding or chest pain or palpitations   Patient has been getting itching on her body and recommended that she start taking her antihistamine again  Relevant past medical, surgical, family and social history reviewed and updated as indicated. Interim medical history since our last visit reviewed. Allergies and medications reviewed and updated.  Review of Systems  Constitutional: Negative for chills and fever.  Eyes: Negative for visual disturbance.  Respiratory: Negative for chest tightness and shortness of breath.   Cardiovascular: Negative for chest pain and leg swelling.  Gastrointestinal: Negative for blood in stool.  Genitourinary: Negative for hematuria.  Skin: Negative for rash.  Psychiatric/Behavioral: Negative for agitation and behavioral problems.  All other systems reviewed and are negative.   Per HPI unless specifically indicated above   Allergies as of 05/13/2021      Reactions   Contrast Media [iodinated Diagnostic Agents] Hives   Amoxicillin Itching   Ampicillin Itching   Cefdinir Nausea And Vomiting   Cortisol [hydrocortisone] Itching   Ivp Dye [iodinated Diagnostic Agents] Hives   Levaquin [levofloxacin] Itching, Rash   Penicillins Rash   "haven't had it in years"   Vancomycin Itching, Rash      Medication List       Accurate as of May 13, 2021  1:41 PM. If you have any questions, ask your nurse or doctor.        atorvastatin 10 MG tablet Commonly known as: LIPITOR TAKE 1 TABLET BY MOUTH EVERY DAY    baclofen 10 MG tablet Commonly known as: LIORESAL TAKE 1 TABLET BY MOUTH AT BEDTIME AS NEEDED FOR MUSCLE SPASMS.   benzonatate 100 MG capsule Commonly known as: Tessalon Perles Take 1 capsule (100 mg total) by mouth 3 (three) times daily as needed for cough.   calcium carbonate 1250 (500 Ca) MG tablet Commonly known as: OS-CAL - dosed in mg of elemental calcium Take 1 tablet by mouth every morning.   cholecalciferol 1000 units tablet Commonly known as: VITAMIN D Take 1,000 Units by mouth daily.   Dilt-XR 240 MG 24 hr capsule Generic drug: diltiazem TAKE 1 CAPSULE BY MOUTH EVERY MORNING ON AN EMPTY STOMACH   Fish Oil 1000 MG Caps Take 1,000 mg by mouth daily.   furosemide 20 MG tablet Commonly known as: LASIX Take 1 tablet (20 mg total) by mouth daily. What changed:   how much to take  additional instructions   Gemtesa 75 MG Tabs Generic drug: Vibegron Take 1 tablet by mouth daily.   loratadine 10 MG tablet Commonly known as: CLARITIN TAKE 1 TABLET BY MOUTH ONCE DAILY FOR ALLERGY   mirtazapine 7.5 MG tablet Commonly known as: REMERON TAKE 1 TABLET BY MOUTH AT BEDTIME.   olopatadine 0.1 % ophthalmic solution Commonly known as: PATANOL 1 drop 2 (two) times daily.   omeprazole 20 MG capsule Commonly known as: PRILOSEC TAKE 1 CAPSULE BY MOUTH EVERY DAY   PRESERVISION AREDS  2+MULTI VIT PO Take by mouth.   ProAir HFA 108 (90 Base) MCG/ACT inhaler Generic drug: albuterol TAKE 2 PUFFS BY MOUTH EVERY 6 HOURS AS NEEDED FOR WHEEZE OR SHORTNESS OF BREATH   Toilet Safety Frame Misc UAD   warfarin 3 MG tablet Commonly known as: COUMADIN Take as directed by the anticoagulation clinic. If you are unsure how to take this medication, talk to your nurse or doctor. Original instructions: TAKE 1 TABLET BY MOUTH EVERY DAY EXCEPT TAKE 2 ON MONDAY, WEDNESDAY, FRIDAY, AND SATURDAY        Objective:   BP (!) 132/59   Pulse 94   Temp 97.7 F (36.5 C)   Ht 5\' 5"   (1.651 m)   Wt 149 lb 12.8 oz (67.9 kg)   SpO2 97%   BMI 24.93 kg/m   Wt Readings from Last 3 Encounters:  05/13/21 149 lb 12.8 oz (67.9 kg)  04/27/21 150 lb 9.6 oz (68.3 kg)  04/13/21 152 lb (68.9 kg)    Physical Exam Vitals and nursing note reviewed.  Constitutional:      General: She is not in acute distress.    Appearance: She is well-developed. She is not diaphoretic.  Eyes:     Conjunctiva/sclera: Conjunctivae normal.  Cardiovascular:     Rate and Rhythm: Normal rate. Rhythm irregular.     Heart sounds: Normal heart sounds. No murmur heard.   Pulmonary:     Effort: Pulmonary effort is normal. No respiratory distress.     Breath sounds: Normal breath sounds. No wheezing.  Skin:    General: Skin is warm and dry.     Findings: No rash.  Neurological:     Mental Status: She is alert and oriented to person, place, and time.     Coordination: Coordination normal.  Psychiatric:        Behavior: Behavior normal.       Assessment & Plan:   Problem List Items Addressed This Visit   None   Visit Diagnoses    Chronic atrial fibrillation ([place])    -  Primary   Relevant Orders   CoaguChek XS/INR Waived      Description   2 tablets (6 mg) on Monday Wednesday Friday, 1 tablet (3 mg) all other days  INR 2.9 goal 2.0-3.0 Diagnosis chronic A. Fib Follow-up 4 to 6 weeks     Follow up plan: Return if symptoms worsen or fail to improve, for 4 to 6-week INR follow-up.  Counseling provided for all of the vaccine components Orders Placed This Encounter  Procedures  . CoaguChek XS/INR [place], MD [facility] 05/13/2021, 1:41 PM

## 2021-05-14 NOTE — Progress Notes (Deleted)
Subjective:   [name] is a 87 y.o. female who presents for Medicare Annual (Subsequent) preventive examination.  Virtual Visit via Telephone Note  I connected with  [name] on 05/14/21 at  2:00 PM EDT by telephone and verified that I am speaking with the correct person using two identifiers.  Location: Patient: *** Provider: *** Persons participating in the virtual visit: [name]   I discussed the limitations, risks, security and privacy concerns of performing an evaluation and management service by telephone and the availability of in person appointments. The patient expressed understanding and agreed to proceed.  Interactive audio and video telecommunications were attempted between this nurse and patient, however failed, due to patient having technical difficulties OR patient did not have access to video capability.  We continued and completed visit with audio only.  Some vital signs may be absent or patient reported.    E , LPN   Review of Systems    ***       Objective:    There were no vitals filed for this visit. There is no height or weight on file to calculate BMI.  Advanced Directives 05/13/2020 05/11/2019 11/09/2018 08/30/2018 08/30/2018 01/29/2018 01/28/2018  Does Patient Have a Medical Advance Directive? _0  No No;Yes  Type of Advance Directive Living will;Healthcare Power of [place];Living will [place];Living will [place];Living will [place];Living will - [place];Living will  Does patient want to make changes to medical advance directive? No - Patient declined No - Patient declined - No - Patient declined - - -  Copy of [place] in Chart? - No - copy requested - - - No - copy requested No - copy requested  Would patient like information on creating a medical advance  directive? - - - - - - No - Patient declined  Pre-existing out of facility DNR order (yellow form or pink MOST form) - - - - - - -    Current Medications (verified) Outpatient Encounter Medications as of 05/14/2021  Medication Sig  . atorvastatin (LIPITOR) 10 MG tablet TAKE 1 TABLET BY MOUTH EVERY DAY  . baclofen (LIORESAL) 10 MG tablet TAKE 1 TABLET BY MOUTH AT BEDTIME AS NEEDED FOR MUSCLE SPASMS.  . benzonatate (TESSALON PERLES) 100 MG capsule Take 1 capsule (100 mg total) by mouth 3 (three) times daily as needed for cough.  . calcium carbonate (OS-CAL - DOSED IN MG OF ELEMENTAL CALCIUM) 1250 MG tablet Take 1 tablet by mouth every morning.   . cholecalciferol (VITAMIN D) 1000 UNITS tablet Take 1,000 Units by mouth daily.  Marland Kitchen DILT-XR 240 MG 24 hr capsule TAKE 1 CAPSULE BY MOUTH EVERY MORNING ON AN EMPTY STOMACH  . furosemide (LASIX) 20 MG tablet Take 1 tablet (20 mg total) by mouth daily. (Patient taking differently: Take 20-40 mg by mouth daily. Take one tablet (20MG) by mouth daily except on Mon, Wed, Friday take 2 tablets (40 MG))  . loratadine (CLARITIN) 10 MG tablet Take 1 tablet (10 mg total) by mouth daily as needed for allergies or itching.  . mirtazapine (REMERON) 7.5 MG tablet TAKE 1 TABLET BY MOUTH AT BEDTIME.  . Misc. Devices ([place]) MISC UAD  . Multiple Vitamins-Minerals (PRESERVISION AREDS 2+MULTI VIT PO) Take by mouth.  Marland Kitchen olopatadine (PATANOL) 0.1 % ophthalmic solution 1 drop 2 (two) times daily.  . Omega-3 Fatty Acids (  FISH OIL) 1000 MG CAPS Take 1,000 mg by mouth daily.  Marland Kitchen omeprazole (PRILOSEC) 20 MG capsule TAKE 1 CAPSULE BY MOUTH EVERY DAY  . PROAIR HFA 108 (90 Base) MCG/ACT inhaler TAKE 2 PUFFS BY MOUTH EVERY 6 HOURS AS NEEDED FOR WHEEZE OR SHORTNESS OF BREATH  . Vibegron (GEMTESA) 75 MG TABS Take 1 tablet by mouth daily.  Marland Kitchen warfarin (COUMADIN) 3 MG tablet TAKE 1 TABLET BY MOUTH EVERY DAY EXCEPT TAKE 2 ON MONDAY, WEDNESDAY, FRIDAY, AND SATURDAY   No  facility-administered encounter medications on file as of 05/14/2021.    Allergies (verified) Contrast media [iodinated diagnostic agents], Amoxicillin, Ampicillin, Cefdinir, Cortisol [hydrocortisone], Ivp dye [iodinated diagnostic agents], Levaquin [levofloxacin], Penicillins, and Vancomycin   History: Past Medical History:  Diagnosis Date  . AAA (abdominal aortic aneurysm) ([place])   . Allergy   . Anxiety   . Bladder cancer ([place]) dx'd 2011   Chronic microscopic hematuria; transitional cell cancer  . Blood transfusion 1960   "related to hysterectomy"  . Chronic atrial fibrillation (HCC)    Anticoagulated with warfarin, rate control with diltiazem and Toprol  . Chronic back pain   . Chronic headache    "probably 2/wk" (08/28/2015)  . Concussion w/o coma 6/71/2458   Complicated by subarachnoid hemorrhage.  "even now has times when she's not able to comprehend" (05/08/12)  . Coronary artery disease    Nonobstructive  . Diverticulitis   . Diverticulosis   . DVT (deep venous thrombosis), right 2005   "right calf after 8 foot fall"  . Edema of both legs    Chronic, thought to be secondary to DVTs  . External hemorrhoids   . Family history of adverse reaction to anesthesia 2012   daughter "had OR for crushed hand; had problems w/anesthesia & I was in there all day long" (  . Fatty liver   . Frequent UTI   . GERD (gastroesophageal reflux disease)   . H/O hiatal hernia   . High cholesterol   . Migraine    "rare now" (08/28/2015)  . Pneumonia ~ 2010;  2013; 07/2015  . Rheumatoid arthritis(714.0)    "hands" (08/28/2015)  . Urinary frequency    Past Surgical History:  Procedure Laterality Date  . BREAST CYST EXCISION Left 1960's?   2 cysts; benign  . CARDIAC CATHETERIZATION  1980's  . CATARACT EXTRACTION W/ INTRAOCULAR LENS  IMPLANT, BILATERAL Bilateral 1992  . CYSTOSCOPY  11/11/2011   Procedure: CYSTOSCOPY;  Surgeon: Malka So;  Location: [facility];  Service: Urology;  Laterality:  N/A;  . CYSTOSTOMY W/ BLADDER BIOPSY  10/08/2013  . INCONTINENCE SURGERY  1980's  . JOINT REPLACEMENT    . KNEE ARTHROSCOPY Right 2005   S/P fall  . Lower Extremity Venous Doppler  11/10/2013   No DVT or superficial thrombus enlarged inguinal lymph node noted in the right. No Baker's cyst.  . TONSILLECTOMY  1952  . TOTAL KNEE ARTHROPLASTY Right 02/05/2013   Procedure: TOTAL KNEE ARTHROPLASTY;  Surgeon: Mauri Pole, MD;  Location: [facility];  Service: Orthopedics;  Laterality: Right;  . TRANSTHORACIC ECHOCARDIOGRAM  01/'15; 4/'19   a) EF 60-65%. NO RWMA. Ao Sclerosis. MAC - no MS with mild MR>  B Atriae mildly dilated;; b)  EF 55-60%. AoV Sclerosis (no stenosis).  Trivial AI & MR. Mild LAE & Severe RAE.. Mild RV dilation  . TRANSURETHRAL RESECTION OF BLADDER TUMOR  11/11/2011   Procedure: TRANSURETHRAL RESECTION OF BLADDER TUMOR (TURBT);  Surgeon: Malka So;  [facility];  Service: Urology;  Laterality: N/A;  Cysto, Bladder Biopsy, TURBT with Gyrus,   . TRANSURETHRAL RESECTION OF BLADDER TUMOR WITH GYRUS (TURBT-GYRUS)  2007; 2009; 2010   "for tumors on surface of bladder"  . TUMOR EXCISION  1976; 1980's   "fatty tumor cut off her upper back; left thumb"  . VAGINAL HYSTERECTOMY  1960   partial    Family History  Problem Relation Age of Onset  . Anesthesia problems Daughter   . Cancer Mother        uterine  . Cancer Sister        leukemia  . Cancer Brother   . Prostate cancer Brother   . Cancer Brother   . Breast cancer Other        neice  . Heart disease Brother   . Cancer Brother   . Heart attack Sister   . Colon cancer Neg Hx    Social History   Socioeconomic History  . Marital status: Widowed    Spouse name: Not on file  . Number of children: 3  . Years of education: Not on file  . Highest education level: 8th grade  Occupational History  . Occupation: Retired  Tobacco Use  . Smoking status: Former Smoker    Packs/day: 1.00    Years: 40.00    Pack years:  40.00    Types: Cigarettes    Quit date: 11/07/1982    Years since quitting: 38.5  . Smokeless tobacco: Former Systems developer    Types: Snuff  Vaping Use  . Vaping Use: Never used  Substance and Sexual Activity  . Alcohol use: Yes    Alcohol/week: 7.0 standard drinks    Types: 7 Glasses of wine per week    Comment: 1 glass of wine prior to bed  . Drug use: No  . Sexual activity: Not Currently    Birth control/protection: Surgical  Other Topics Concern  . Not on file  Social History Narrative   ** Merged History Encounter **       She is a widowed mother of 34, grandmother of 86, great grandmother of 75. She does not   really get routine exercise. She quit smoking in 1983. She does not smoke and does not use illicit drugs and does not drink.   Social Determinants of Health   Financial Resource Strain: Not on file  Food Insecurity: Not on file  Transportation Needs: Not on file  Physical Activity: Not on file  Stress: Not on file  Social Connections: Not on file    Tobacco Counseling Counseling given: Not Answered   Clinical Intake:                 Diabetic?***         Activities of Daily Living No flowsheet data found.  Patient Care Team: Janora Norlander, DO as PCP - General (Family Medicine) Leonie Man, MD as PCP - Cardiology (Cardiology) Janora Norlander, DO (Family Medicine)  Indicate any recent Medical Services you may have received from other than [facility] providers in the past year (date may be approximate).     Assessment:   This is a routine wellness examination for Daphane.  Hearing/Vision screen No exam data present  Dietary issues and exercise activities discussed:    Goals Addressed   None    Depression Screen PHQ 2/9 Scores 05/13/2021 04/27/2021 04/13/2021 02/24/2021 02/17/2021 02/10/2021 01/21/2021  PHQ - 2 Score 0 4 0 0 0 0  0  PHQ- 9 Score - 13 - 0 0 0 -    Fall Risk Fall Risk  05/13/2021 04/27/2021 04/13/2021 02/24/2021 02/10/2021  Falls  in the past year? 1 1 0 0 0  Number falls in past yr: 1 1 - - -  Injury with Fall? 1 1 - - -  Risk Factor Category  - - - - -  Risk for fall due to : History of fall(s) History of fall(s) - - -  Follow up Falls evaluation completed Education provided - - -    FALL RISK PREVENTION PERTAINING TO THE HOME:  Any stairs in or around the home? {YES/NO:21197} If so, are there any without handrails? {YES/NO:21197} Home free of loose throw rugs in walkways, pet beds, electrical cords, etc? {YES/NO:21197} Adequate lighting in your home to reduce risk of falls? {YES/NO:21197}  ASSISTIVE DEVICES UTILIZED TO PREVENT FALLS:  Life alert? {YES/NO:21197} Use of a cane, walker or w/c? {YES/NO:21197} Grab bars in the bathroom? {YES/NO:21197} Shower chair or bench in shower? {YES/NO:21197} Elevated toilet seat or a handicapped toilet? {YES/NO:21197}  TIMED UP AND GO:  Was the test performed? {YES/NO:21197}.  Length of time to ambulate 10 feet: *** sec.   {Appearance of UMPN:3614431}  Cognitive Function:     6CIT Screen 05/13/2020 05/11/2019  What Year? 0 points 0 points  What month? 0 points 0 points  What time? 0 points 0 points  Count back from 20 0 points 0 points  Months in reverse 2 points 0 points  Repeat phrase 10 points 2 points  Total Score 12 2    Immunizations Immunization History  Administered Date(s) Administered  . Fluad Quad(high Dose 65+) 09/26/2019, 10/08/2020  . Influenza, High Dose Seasonal PF 09/17/2017  . Influenza-Unspecified 09/25/1996, 09/17/1997, 10/31/2004  . Pneumococcal-Unspecified 08/14/2011    {TDAP status:2101805}  {Flu Vaccine status:2101806}  {Pneumococcal vaccine status:2101807}  {Covid-19 vaccine status:2101808}  Qualifies for Shingles Vaccine? {YES/NO:21197}  Zostavax completed {YES/NO:21197}  {Shingrix Completed?:2101804}  Screening Tests Health Maintenance  Topic Date Due  . COVID-19 Vaccine (1) Never done  . TETANUS/TDAP  Never done   . Zoster Vaccines- Shingrix (1 of 2) Never done  . INFLUENZA VACCINE  07/13/2021  . DEXA SCAN  Completed  . PNA vac Low Risk Adult  Completed  . HPV VACCINES  Aged Out    Health Maintenance  Health Maintenance Due  Topic Date Due  . COVID-19 Vaccine (1) Never done  . TETANUS/TDAP  Never done  . Zoster Vaccines- Shingrix (1 of 2) Never done    {Colorectal cancer screening:2101809}  {Mammogram status:21018020}  {Bone Density status:21018021}  Lung Cancer Screening: (Low Dose CT Chest recommended if Age 106-80 years, 30 pack-year currently smoking OR have quit w/in 15years.) {DOES NOT does:27190::"does not"} qualify.   Lung Cancer Screening Referral: ***  Additional Screening:  Hepatitis C Screening: {DOES NOT does:27190::"does not"} qualify; Completed ***  Vision Screening: Recommended annual ophthalmology exams for early detection of glaucoma and other disorders of the eye. Is the patient up to date with their annual eye exam?  {YES/NO:21197} Who is the provider or what is the name of the office in which the patient attends annual eye exams? *** If pt is not established with a provider, would they like to be referred to a provider to establish care? {YES/NO:21197}.   Dental Screening: Recommended annual dental exams for proper oral hygiene  Community Resource Referral / Chronic Care Management: CRR required this visit?  {YES/NO:21197}  CCM required this  visit?  {YES/NO:21197}     Plan:     I have personally reviewed and noted the following in the patient's chart:   . Medical and social history . Use of alcohol, tobacco or illicit drugs  . Current medications and supplements including opioid prescriptions.  . Functional ability and status . Nutritional status . Physical activity . Advanced directives . List of other physicians . Hospitalizations, surgeries, and ER visits in previous 12 months . Vitals . Screenings to include cognitive, depression, and  falls . Referrals and appointments  In addition, I have reviewed and discussed with patient certain preventive protocols, quality metrics, and best practice recommendations. A written personalized care plan for preventive services as well as general preventive health recommendations were provided to patient.     Sandrea Hammond, LPN   05/14/354   Nurse Notes: ***

## 2021-05-15 DIAGNOSIS — G309 Alzheimer's disease, unspecified: Secondary | ICD-10-CM | POA: Diagnosis not present

## 2021-05-15 DIAGNOSIS — C679 Malignant neoplasm of bladder, unspecified: Secondary | ICD-10-CM | POA: Diagnosis not present

## 2021-05-15 DIAGNOSIS — D6869 Other thrombophilia: Secondary | ICD-10-CM | POA: Diagnosis not present

## 2021-05-15 DIAGNOSIS — J42 Unspecified chronic bronchitis: Secondary | ICD-10-CM | POA: Diagnosis not present

## 2021-05-15 DIAGNOSIS — I480 Paroxysmal atrial fibrillation: Secondary | ICD-10-CM | POA: Diagnosis not present

## 2021-05-15 DIAGNOSIS — E261 Secondary hyperaldosteronism: Secondary | ICD-10-CM | POA: Diagnosis not present

## 2021-05-15 DIAGNOSIS — I509 Heart failure, unspecified: Secondary | ICD-10-CM | POA: Diagnosis not present

## 2021-05-15 DIAGNOSIS — M4690 Unspecified inflammatory spondylopathy, site unspecified: Secondary | ICD-10-CM | POA: Diagnosis not present

## 2021-05-15 DIAGNOSIS — I714 Abdominal aortic aneurysm, without rupture: Secondary | ICD-10-CM | POA: Diagnosis not present

## 2021-05-15 DIAGNOSIS — F0280 Dementia in other diseases classified elsewhere without behavioral disturbance: Secondary | ICD-10-CM | POA: Diagnosis not present

## 2021-05-18 ENCOUNTER — Other Ambulatory Visit: Payer: Self-pay | Admitting: Family Medicine

## 2021-05-25 ENCOUNTER — Telehealth: Payer: Self-pay | Admitting: Family Medicine

## 2021-05-25 ENCOUNTER — Other Ambulatory Visit: Payer: Self-pay

## 2021-05-25 DIAGNOSIS — G8929 Other chronic pain: Secondary | ICD-10-CM

## 2021-05-25 DIAGNOSIS — Z9181 History of falling: Secondary | ICD-10-CM

## 2021-06-04 DIAGNOSIS — G8929 Other chronic pain: Secondary | ICD-10-CM | POA: Diagnosis not present

## 2021-06-04 DIAGNOSIS — I482 Chronic atrial fibrillation, unspecified: Secondary | ICD-10-CM | POA: Diagnosis not present

## 2021-06-04 DIAGNOSIS — M25561 Pain in right knee: Secondary | ICD-10-CM | POA: Diagnosis not present

## 2021-06-11 ENCOUNTER — Telehealth: Payer: Self-pay | Admitting: Family Medicine

## 2021-06-12 NOTE — Telephone Encounter (Signed)
I won't be back in office for over a week.  I think [name] has some openings on Wed.  Can she schedule with her please?  Make sure she has an appropriate follow up with me BEFORE she leaves the office on Wednesday.

## 2021-06-17 ENCOUNTER — Ambulatory Visit (INDEPENDENT_AMBULATORY_CARE_PROVIDER_SITE_OTHER): Payer: PPO | Admitting: Family Medicine

## 2021-06-17 ENCOUNTER — Telehealth: Payer: Self-pay | Admitting: Family Medicine

## 2021-06-17 ENCOUNTER — Other Ambulatory Visit: Payer: Self-pay

## 2021-06-17 ENCOUNTER — Encounter: Payer: Self-pay | Admitting: Family Medicine

## 2021-06-17 VITALS — BP 136/60 | HR 89 | Temp 98.0°F | Ht 65.0 in | Wt 152.0 lb

## 2021-06-17 DIAGNOSIS — I482 Chronic atrial fibrillation, unspecified: Secondary | ICD-10-CM | POA: Diagnosis not present

## 2021-06-17 DIAGNOSIS — Z7901 Long term (current) use of anticoagulants: Secondary | ICD-10-CM

## 2021-06-17 LAB — COAGUCHEK XS/INR WAIVED
INR: 2 — ABNORMAL HIGH (ref 0.9–1.1)
Prothrombin Time: 24.6 s

## 2021-06-17 LAB — POCT INR: INR: 2 (ref 2–3)

## 2021-06-17 NOTE — Progress Notes (Signed)
Established Patient Office Visit  Subjective:  Patient ID: [name], female    DOB: 1921-04-10  Age: 87 y.o. MRN: [id number]  CC:  Chief Complaint  Patient presents with   Atrial Fibrillation    HPI [name] presents for INR check.  Current regimen: 6 mg on Mon, Wed, Fri. 3 mg on Tues, Thurs   Indication: A.fib Bleeding Signs/Symptoms:  no  Thromboembolic Signs/Symptoms:     Missed Coumadin Doses: she did miss 1 dose about 2 weeks ago Medication Changes:  no Dietary Changes:  no Bacterial/Viral Infection:    Denies chest pain, shortness of breath, or edema.   Past Medical History:  Diagnosis Date   AAA (abdominal aortic aneurysm) ([place])    Allergy    Anxiety    Bladder cancer ([place]) dx'd 2011   Chronic microscopic hematuria; transitional cell cancer   Blood transfusion 1960   "related to hysterectomy"   Chronic atrial fibrillation ([place])    Anticoagulated with warfarin, rate control with diltiazem and Toprol   Chronic back pain    Chronic headache    "probably 2/wk" (08/28/2015)   Concussion w/o coma 0/24/0973   Complicated by subarachnoid hemorrhage.  "even now has times when she's not able to comprehend" (05/08/12)   Coronary artery disease    Nonobstructive   Diverticulitis    Diverticulosis    DVT (deep venous thrombosis), right 2005   "right calf after 8 foot fall"   Edema of both legs    Chronic, thought to be secondary to DVTs   External hemorrhoids    Family history of adverse reaction to anesthesia 2012   daughter "had OR for crushed hand; had problems w/anesthesia & I was in there all day long" (   Fatty liver    Frequent UTI    GERD (gastroesophageal reflux disease)    H/O hiatal hernia    High cholesterol    Migraine    "rare now" (08/28/2015)   Pneumonia ~ 2010;  2013; 07/2015   Rheumatoid arthritis(714.0)    "hands" (08/28/2015)   Urinary frequency     Past Surgical History:  Procedure Laterality Date   BREAST CYST  EXCISION Left 1960's?   2 cysts; benign   CARDIAC CATHETERIZATION  1980's   CATARACT EXTRACTION W/ INTRAOCULAR LENS  IMPLANT, BILATERAL Bilateral 1992   CYSTOSCOPY  11/11/2011   Procedure: CYSTOSCOPY;  Surgeon: Malka So;  [facility];  Service: Urology;  Laterality: N/A;   CYSTOSTOMY W/ BLADDER BIOPSY  10/08/2013   INCONTINENCE SURGERY  1980's   JOINT REPLACEMENT     KNEE ARTHROSCOPY Right 2005   S/P fall   Lower Extremity Venous Doppler  11/10/2013   No DVT or superficial thrombus enlarged inguinal lymph node noted in the right. No Baker's cyst.   TONSILLECTOMY  1952   TOTAL KNEE ARTHROPLASTY Right 02/05/2013   Procedure: TOTAL KNEE ARTHROPLASTY;  Surgeon: Mauri Pole, MD;  [facility];  Service: Orthopedics;  Laterality: Right;   TRANSTHORACIC ECHOCARDIOGRAM  01/'15; 4/'19   a) EF 60-65%. NO RWMA. Ao Sclerosis. MAC - no MS with mild MR>  B Atriae mildly dilated;; b)  EF 55-60%. AoV Sclerosis (no stenosis).  Trivial AI & MR. Mild LAE & Severe RAE.. Mild RV dilation   TRANSURETHRAL RESECTION OF BLADDER TUMOR  11/11/2011   Procedure: TRANSURETHRAL RESECTION OF BLADDER TUMOR (TURBT);  Surgeon: Malka So;  [facility];  Service: Urology;  Laterality: N/A;  Cysto,  Bladder Biopsy, TURBT with Gyrus,    TRANSURETHRAL RESECTION OF BLADDER TUMOR WITH GYRUS (TURBT-GYRUS)  2007; 2009; 2010   "for tumors on surface of bladder"   TUMOR EXCISION  1976; 1980's   "fatty tumor cut off her upper back; left thumb"   VAGINAL HYSTERECTOMY  1960   partial     Family History  Problem Relation Age of Onset   Anesthesia problems Daughter    Cancer Mother        uterine   Cancer Sister        leukemia   Cancer Brother    Prostate cancer Brother    Cancer Brother    Breast cancer Other        neice   Heart disease Brother    Cancer Brother    Heart attack Sister    Colon cancer Neg Hx     Social History   Socioeconomic History   Marital status: Widowed    Spouse name:  Not on file   Number of children: 3   Years of education: Not on file   Highest education level: 8th grade  Occupational History   Occupation: Retired  Tobacco Use   Smoking status: Former    Packs/day: 1.00    Years: 40.00    Pack years: 40.00    Types: Cigarettes    Quit date: 11/07/1982    Years since quitting: 38.6   Smokeless tobacco: Former    Types: Snuff  Vaping Use   Vaping Use: Never used  Substance and Sexual Activity   Alcohol use: Yes    Alcohol/week: 7.0 standard drinks    Types: 7 Glasses of wine per week    Comment: 1 glass of wine prior to bed   Drug use: No   Sexual activity: Not Currently    Birth control/protection: Surgical  Other Topics Concern   Not on file  Social History Narrative   ** Merged History Encounter **       She is a widowed mother of 54, grandmother of 72, great grandmother of 76. She does not   really get routine exercise. She quit smoking in 1983. She does not smoke and does not use illicit drugs and does not drink.   Social Determinants of Health   Financial Resource Strain: Not on file  Food Insecurity: Not on file  Transportation Needs: Not on file  Physical Activity: Not on file  Stress: Not on file  Social Connections: Not on file  Intimate Partner Violence: Not on file    Outpatient Medications Prior to Visit  Medication Sig Dispense Refill   atorvastatin (LIPITOR) 10 MG tablet TAKE 1 TABLET BY MOUTH EVERY DAY 90 tablet 0   baclofen (LIORESAL) 10 MG tablet TAKE 1 TABLET BY MOUTH AT BEDTIME AS NEEDED FOR MUSCLE SPASMS. 90 tablet 1   calcium carbonate (OS-CAL - DOSED IN MG OF ELEMENTAL CALCIUM) 1250 MG tablet Take 1 tablet by mouth every morning.      cholecalciferol (VITAMIN D) 1000 UNITS tablet Take 1,000 Units by mouth daily.     DILT-XR 240 MG 24 hr capsule TAKE 1 CAPSULE BY MOUTH EVERY MORNING ON AN EMPTY STOMACH 90 capsule 5   furosemide (LASIX) 20 MG tablet TAKE ONE TO TWO TABLETS EVERY DAY AS INSTRUCTED 180 tablet 2    loratadine (CLARITIN) 10 MG tablet Take 1 tablet (10 mg total) by mouth daily as needed for allergies or itching. 30 tablet 2   mirtazapine (REMERON) 7.5  MG tablet TAKE 1 TABLET BY MOUTH AT BEDTIME. 90 tablet 1   Misc. Devices (TOILET SAFETY FRAME) MISC UAD 1 each 0   Multiple Vitamins-Minerals (PRESERVISION AREDS 2+MULTI VIT PO) Take by mouth.     olopatadine (PATANOL) 0.1 % ophthalmic solution 1 drop 2 (two) times daily.     Omega-3 Fatty Acids (FISH OIL) 1000 MG CAPS Take 1,000 mg by mouth daily.     omeprazole (PRILOSEC) 20 MG capsule TAKE 1 CAPSULE BY MOUTH EVERY DAY 90 capsule 3   PROAIR HFA 108 (90 Base) MCG/ACT inhaler TAKE 2 PUFFS BY MOUTH EVERY 6 HOURS AS NEEDED FOR WHEEZE OR SHORTNESS OF BREATH 8.5 Inhaler 4   Vibegron (GEMTESA) 75 MG TABS Take 1 tablet by mouth daily. 30 tablet 3   warfarin (COUMADIN) 3 MG tablet TAKE 1 TABLET BY MOUTH EVERY DAY EXCEPT TAKE 2 ON MONDAY, WEDNESDAY, FRIDAY, AND SATURDAY 140 tablet 2   benzonatate (TESSALON PERLES) 100 MG capsule Take 1 capsule (100 mg total) by mouth 3 (three) times daily as needed for cough. 20 capsule 0   No facility-administered medications prior to visit.    Allergies  Allergen Reactions   Contrast Media [Iodinated Diagnostic Agents] Hives   Amoxicillin Itching   Ampicillin Itching   Cefdinir Nausea And Vomiting   Cortisol [Hydrocortisone] Itching   Ivp Dye [Iodinated Diagnostic Agents] Hives   Levaquin [Levofloxacin] Itching and Rash   Penicillins Rash    "haven't had it in years"   Vancomycin Itching and Rash    ROS Review of Systems As per HPI.    Objective:    Physical Exam Vitals and nursing note reviewed.  Constitutional:      General: She is not in acute distress.    Appearance: She is not ill-appearing, toxic-appearing or diaphoretic.  Cardiovascular:     Rate and Rhythm: Normal rate. Rhythm irregular.     Heart sounds: Normal heart sounds. No murmur heard. Pulmonary:     Effort: Pulmonary effort  is normal. No respiratory distress.     Breath sounds: Normal breath sounds.  Musculoskeletal:     Right lower leg: No edema.     Left lower leg: No edema.  Skin:    General: Skin is warm and dry.  Neurological:     Mental Status: She is alert and oriented to person, place, and time. Mental status is at baseline.  Psychiatric:        Mood and Affect: Mood normal.        Behavior: Behavior normal.    BP 136/60 Comment: at home reading  Pulse 89   Temp 98 F (36.7 C) (Oral)   Ht _0  (1.651 m)   Wt 152 lb (68.9 kg)   BMI 25.29 kg/m  Wt Readings from Last 3 Encounters:  06/17/21 152 lb (68.9 kg)  05/13/21 149 lb 12.8 oz (67.9 kg)  04/27/21 150 lb 9.6 oz (68.3 kg)     Health Maintenance Due  Topic Date Due   COVID-19 Vaccine (1) Never done   TETANUS/TDAP  Never done   Zoster Vaccines- Shingrix (1 of 2) Never done    There are no preventive care reminders to display for this patient.  Lab Results  Component Value Date   TSH 4.450 12/17/2020   Lab Results  Component Value Date   WBC 8.0 12/17/2020   HGB 13.3 12/17/2020   HCT 39.9 12/17/2020   MCV 96 12/17/2020   PLT 268 12/17/2020   Lab  Results  Component Value Date   NA 136 12/17/2020   K 3.7 12/17/2020   CO2 25 12/17/2020   GLUCOSE 84 12/17/2020   BUN 11 12/17/2020   CREATININE 0.71 12/17/2020   BILITOT 0.4 12/17/2020   ALKPHOS 102 12/17/2020   AST 19 12/17/2020   ALT 16 12/17/2020   PROT 6.7 12/17/2020   ALBUMIN 4.3 12/17/2020   CALCIUM 9.2 12/17/2020   ANIONGAP 13 12/18/2019   GFR 69.67 06/11/2013   No results found for: CHOL No results found for: HDL No results found for: LDLCALC No results found for: TRIG No results found for: CHOLHDL Lab Results  Component Value Date   HGBA1C (H) 09/18/2007    6.3 (NOTE)   The ADA recommends the following therapeutic goals for glycemic   control related to Hgb A1C measurement:   Goal of Therapy:   < 7.0% Hgb A1C   Action Suggested:  > 8.0% Hgb A1C   Ref:   Diabetes Care, 22, Suppl. 1, 1999      Assessment & Plan:   [name] was seen today for atrial fibrillation.  Diagnoses and all orders for this visit:  Chronic atrial fibrillation [facility]) Chronic anticoagulation Description   Continue 2 tablets (6 mg) on Monday Wednesday Friday, 1 tablet (3 mg) all other days  INR 2.0 goal 2.0-3.0 Diagnosis chronic A. Fib Follow-up 4 to 6 weeks    -     CoaguChek XS/INR Waived -     POCT INR   Follow-up: Return in about 4 weeks (around 07/15/2021) for with PCP for INR.   The patient indicates understanding of these issues and agrees with the plan.  Gwenlyn Perking, FNP

## 2021-06-17 NOTE — Telephone Encounter (Signed)
Pt's daughter would like to talk to nurse about what went on during the last appt that the pt had.

## 2021-06-17 NOTE — Telephone Encounter (Signed)
Pts daughter informed that a fib was addressed today, no changes in medications. INR was a little elevated. BP was elevated when first checked but came down. Daughter understood and will come to next appt on 8/3 with G

## 2021-06-17 NOTE — Patient Instructions (Signed)
Description   Continue 2 tablets (6 mg) on Monday Wednesday Friday, 1 tablet (3 mg) all other days  INR 2.0 goal 2.0-3.0 Diagnosis chronic A. Fib Follow-up 4 to 6 weeks

## 2021-06-25 ENCOUNTER — Other Ambulatory Visit: Payer: Self-pay | Admitting: Family Medicine

## 2021-06-30 ENCOUNTER — Other Ambulatory Visit: Payer: Self-pay | Admitting: Family Medicine

## 2021-06-30 ENCOUNTER — Telehealth: Payer: Self-pay | Admitting: Family Medicine

## 2021-06-30 NOTE — Telephone Encounter (Signed)
Pts daughter called stating that pt is requesting that Dr Lajuana Ripple send order to [facility] in [place] for lift chair because its hard for pt to get up.   Please advise and call daughter [name])

## 2021-06-30 NOTE — Telephone Encounter (Signed)
I discussed this with [name] and we will need to do a " face-to-face" encounter for this in efforts to even get the insurance to approve.  In my experience has been very difficult to get these covered in the absence of some type of debilitating neuromuscular disorder but I will be glad to try.  We can certainly do this at her next INR visit but if she would like to try and get in sooner, please get her an appointment to do so

## 2021-06-30 NOTE — Progress Notes (Signed)
Will provide written rx to pharmacy of choice for lift chair.  Unsure if insurance will cover but will indicate dx: chronic right knee pain, high risk of falls, chronically anticoagulated

## 2021-07-04 DIAGNOSIS — G8929 Other chronic pain: Secondary | ICD-10-CM | POA: Diagnosis not present

## 2021-07-04 DIAGNOSIS — I482 Chronic atrial fibrillation, unspecified: Secondary | ICD-10-CM | POA: Diagnosis not present

## 2021-07-04 DIAGNOSIS — M25561 Pain in right knee: Secondary | ICD-10-CM | POA: Diagnosis not present

## 2021-07-08 ENCOUNTER — Encounter (HOSPITAL_COMMUNITY): Payer: Self-pay

## 2021-07-08 ENCOUNTER — Other Ambulatory Visit: Payer: Self-pay | Admitting: Family Medicine

## 2021-07-08 DIAGNOSIS — E7800 Pure hypercholesterolemia, unspecified: Secondary | ICD-10-CM

## 2021-07-10 ENCOUNTER — Encounter: Payer: Self-pay | Admitting: Nurse Practitioner

## 2021-07-15 ENCOUNTER — Other Ambulatory Visit: Payer: Self-pay

## 2021-07-15 ENCOUNTER — Ambulatory Visit (INDEPENDENT_AMBULATORY_CARE_PROVIDER_SITE_OTHER): Payer: PPO | Admitting: Family Medicine

## 2021-07-15 ENCOUNTER — Encounter: Payer: Self-pay | Admitting: Family Medicine

## 2021-07-15 VITALS — BP 136/75 | HR 82 | Temp 98.7°F | Resp 20 | Ht 65.0 in | Wt 151.0 lb

## 2021-07-15 DIAGNOSIS — R54 Age-related physical debility: Secondary | ICD-10-CM | POA: Diagnosis not present

## 2021-07-15 DIAGNOSIS — G8929 Other chronic pain: Secondary | ICD-10-CM

## 2021-07-15 DIAGNOSIS — I482 Chronic atrial fibrillation, unspecified: Secondary | ICD-10-CM

## 2021-07-15 DIAGNOSIS — M25561 Pain in right knee: Secondary | ICD-10-CM | POA: Diagnosis not present

## 2021-07-15 DIAGNOSIS — Z9181 History of falling: Secondary | ICD-10-CM | POA: Diagnosis not present

## 2021-07-15 DIAGNOSIS — Z7901 Long term (current) use of anticoagulants: Secondary | ICD-10-CM

## 2021-07-15 LAB — BASIC METABOLIC PANEL
BUN/Creatinine Ratio: 17 (ref 12–28)
BUN: 13 mg/dL (ref 10–36)
CO2: 22 mmol/L (ref 20–29)
Calcium: 9 mg/dL (ref 8.7–10.3)
Chloride: 99 mmol/L (ref 96–106)
Creatinine, Ser: 0.75 mg/dL (ref 0.57–1.00)
Glucose: 103 mg/dL — ABNORMAL HIGH (ref 65–99)
Potassium: 4.7 mmol/L (ref 3.5–5.2)
Sodium: 137 mmol/L (ref 134–144)
eGFR: 72 mL/min/{1.73_m2} (ref >59)

## 2021-07-15 LAB — COAGUCHEK XS/INR WAIVED
INR: 2.5 — ABNORMAL HIGH (ref 0.9–1.1)
Prothrombin Time: 29.8 s

## 2021-07-15 LAB — HEMOGLOBIN, FINGERSTICK: Hemoglobin: 12.6 g/dL (ref 11.1–15.9)

## 2021-07-15 NOTE — Addendum Note (Signed)
Addended by: Antonietta Barcelona D on: 07/15/2021 12:49 PM   Modules accepted: Orders

## 2021-07-15 NOTE — Progress Notes (Signed)
Subjective: CC: INR recheck; F2F for lift chair PCP: Janora Norlander, DO [id number] E Cornish is a 87 y.o. female presenting to clinic today for:  1. Afib/physical deconditioning, fall risk Goal INR 2-3.  Patient is compliant with her medication regimen.  Currently taking 2 tablets Monday, Wednesday, Friday and Saturday with 1 tablet daily all others.  Last INR was therapeutic 4 weeks ago.  She is accompanied today by her daughter.  Overall she is been doing well.  No reports of bleeding episodes, heart palpitations or change in exercise tolerance but at baseline she is fairly physically deconditioned and is unable to get up from a seated position without assistance from one of her family members.  She is using her toilet seat lift but her family is requesting a recliner lift chair.  At baseline she uses rolling walker for ambulation and is able to maintain this when she gets up.  The issue is actually getting up from a seated position is fairly impossible for her to do independently.  Chronic pain in her right knee compounds this issue   ROS: Per HPI  Allergies  Allergen Reactions   Contrast Media [Iodinated Diagnostic Agents] Hives   Amoxicillin Itching   Ampicillin Itching   Cefdinir Nausea And Vomiting   Cortisol [Hydrocortisone] Itching   Ivp Dye [Iodinated Diagnostic Agents] Hives   Levaquin [Levofloxacin] Itching and Rash   Penicillins Rash    "haven't had it in years"   Vancomycin Itching and Rash   Past Medical History:  Diagnosis Date   AAA (abdominal aortic aneurysm) ([place])    Allergy    Anxiety    Bladder cancer ([place]) dx'd 2011   Chronic microscopic hematuria; transitional cell cancer   Blood transfusion 1960   "related to hysterectomy"   Chronic atrial fibrillation ([place])    Anticoagulated with warfarin, rate control with diltiazem and Toprol   Chronic back pain    Chronic headache    "probably 2/wk" (08/28/2015)   Concussion w/o coma 99991111    Complicated by subarachnoid hemorrhage.  "even now has times when she's not able to comprehend" (05/08/12)   Coronary artery disease    Nonobstructive   Diverticulitis    Diverticulosis    DVT (deep venous thrombosis), right 2005   "right calf after 8 foot fall"   Edema of both legs    Chronic, thought to be secondary to DVTs   External hemorrhoids    Family history of adverse reaction to anesthesia 2012   daughter "had OR for crushed hand; had problems w/anesthesia & I was in there all day long" (   Fatty liver    Frequent UTI    GERD (gastroesophageal reflux disease)    H/O hiatal hernia    High cholesterol    Migraine    "rare now" (08/28/2015)   Pneumonia ~ 2010;  2013; 07/2015   Rheumatoid arthritis(714.0)    "hands" (08/28/2015)   Urinary frequency     Current Outpatient Medications:    atorvastatin (LIPITOR) 10 MG tablet, TAKE 1 TABLET BY MOUTH EVERY DAY, Disp: 90 tablet, Rfl: 0   baclofen (LIORESAL) 10 MG tablet, TAKE 1 TABLET BY MOUTH AT BEDTIME AS NEEDED FOR MUSCLE SPASMS., Disp: 90 tablet, Rfl: 1   calcium carbonate (OS-CAL - DOSED IN MG OF ELEMENTAL CALCIUM) 1250 MG tablet, Take 1 tablet by mouth every morning. , Disp: , Rfl:    cholecalciferol (VITAMIN D) 1000 UNITS tablet, Take 1,000 Units by mouth daily., Disp: ,  Rfl:    DILT-XR 240 MG 24 hr capsule, TAKE 1 CAPSULE BY MOUTH EVERY MORNING ON AN EMPTY STOMACH, Disp: 90 capsule, Rfl: 5   furosemide (LASIX) 20 MG tablet, TAKE ONE TO TWO TABLETS EVERY DAY AS INSTRUCTED, Disp: 180 tablet, Rfl: 2   loratadine (CLARITIN) 10 MG tablet, Take 1 tablet (10 mg total) by mouth daily as needed for allergies or itching., Disp: 30 tablet, Rfl: 2   mirtazapine (REMERON) 7.5 MG tablet, TAKE 1 TABLET BY MOUTH AT BEDTIME., Disp: 90 tablet, Rfl: 1   Misc. Devices (TOILET SAFETY FRAME) MISC, UAD, Disp: 1 each, Rfl: 0   Multiple Vitamins-Minerals (PRESERVISION AREDS 2+MULTI VIT PO), Take by mouth., Disp: , Rfl:    olopatadine (PATANOL) 0.1 %  ophthalmic solution, PLACE 1 DROP IN AFFECTED EYE(S) TWICE DAILY, Disp: 15 mL, Rfl: 5   Omega-3 Fatty Acids (FISH OIL) 1000 MG CAPS, Take 1,000 mg by mouth daily., Disp: , Rfl:    omeprazole (PRILOSEC) 20 MG capsule, TAKE 1 CAPSULE BY MOUTH EVERY DAY, Disp: 90 capsule, Rfl: 3   PROAIR HFA 108 (90 Base) MCG/ACT inhaler, TAKE 2 PUFFS BY MOUTH EVERY 6 HOURS AS NEEDED FOR WHEEZE OR SHORTNESS OF BREATH, Disp: 8.5 Inhaler, Rfl: 4   Vibegron (GEMTESA) 75 MG TABS, Take 1 tablet by mouth daily., Disp: 30 tablet, Rfl: 3   warfarin (COUMADIN) 3 MG tablet, TAKE 1 TABLET BY MOUTH EVERY DAY EXCEPT TAKE 2 ON MONDAY, WEDNESDAY, FRIDAY, AND SATURDAY, Disp: 140 tablet, Rfl: 2 Social History   Socioeconomic History   Marital status: Widowed    Spouse name: Not on file   Number of children: 3   Years of education: Not on file   Highest education level: 8th grade  Occupational History   Occupation: Retired  Tobacco Use   Smoking status: Former    Packs/day: 1.00    Years: 40.00    Pack years: 40.00    Types: Cigarettes    Quit date: 11/07/1982    Years since quitting: 38.7   Smokeless tobacco: Former    Types: Snuff  Vaping Use   Vaping Use: Never used  Substance and Sexual Activity   Alcohol use: Yes    Alcohol/week: 7.0 standard drinks    Types: 7 Glasses of wine per week    Comment: 1 glass of wine prior to bed   Drug use: No   Sexual activity: Not Currently    Birth control/protection: Surgical  Other Topics Concern   Not on file  Social History Narrative   ** Merged History Encounter **       She is a widowed mother of 7, grandmother of 58, great grandmother of 5. She does not   really get routine exercise. She quit smoking in 1983. She does not smoke and does not use illicit drugs and does not drink.   Social Determinants of Health   Financial Resource Strain: Not on file  Food Insecurity: Not on file  Transportation Needs: Not on file  Physical Activity: Not on file  Stress: Not on  file  Social Connections: Not on file  Intimate Partner Violence: Not on file   Family History  Problem Relation Age of Onset   Anesthesia problems Daughter    Cancer Mother        uterine   Cancer Sister        leukemia   Cancer Brother    Prostate cancer Brother    Cancer Brother  Breast cancer Other        neice   Heart disease Brother    Cancer Brother    Heart attack Sister    Colon cancer Neg Hx     Objective: Office vital signs reviewed. BP 136/75   Pulse 82   Temp 98.7 F (37.1 C)   Resp 20   Ht '5\' 5"'$  (1.651 m)   Wt 151 lb (68.5 kg)   SpO2 98%   BMI 25.13 kg/m   Physical Examination:  General: Awake, alert, elderly, frail-appearing female in good spirits, No acute distress HEENT: Normal; sclera white Card: Irregularly irregular with rate control. Pulm: clear to auscultation bilaterally, no wheezes, rhonchi or rales; normal work of breathing on room air Extremities: warm, well perfused, +edema, no cyanosis or clubbing  MSK: Unsteady gait with hunched station.  Utilizes rolling walker for ambulation.  She was observed attempting to get up from an arm chair 3 times without success.  She was unable to rise much more than about an inch from her seat before following back into the seat.  Her muscle tone is fair Skin: dry; intact; no rashes or lesions Neuro: Oriented.  Follows commands  Assessment/ Plan: 87 y.o. female   Chronic atrial fibrillation ([place]) - Plan: Basic Metabolic Panel, CoaguChek XS/INR Waived, Hemoglobin, fingerstick  Chronic anticoagulation - Plan: Basic Metabolic Panel, CoaguChek XS/INR Waived, Hemoglobin, fingerstick  At high risk for injury related to fall  Chronic pain of right knee  Frailty syndrome in geriatric patient  INR therapeutic.  No changes made to medication regimen.  Given her high risk for injury and fall I do agree that a lift recliner would be in her best interest as she is totally dependent upon others to help her get  from a seated position.  We will try and get the sent out to the medical supply stores.  No orders of the defined types were placed in this encounter.  No orders of the defined types were placed in this encounter.    Janora Norlander, DO [place] [phone]

## 2021-07-15 NOTE — Addendum Note (Signed)
Addended by: Janora Norlander on: 07/15/2021 02:52 PM   Modules accepted: Orders

## 2021-07-17 NOTE — Progress Notes (Signed)
Pt r/c about labs 

## 2021-07-22 ENCOUNTER — Other Ambulatory Visit (HOSPITAL_COMMUNITY): Payer: Self-pay | Admitting: Cardiology

## 2021-07-22 DIAGNOSIS — I714 Abdominal aortic aneurysm, without rupture, unspecified: Secondary | ICD-10-CM

## 2021-08-03 ENCOUNTER — Ambulatory Visit (HOSPITAL_COMMUNITY)
Admission: RE | Admit: 2021-08-03 | Discharge: 2021-08-03 | Disposition: A | Discharge status: Home / Self Care | Payer: PPO | Source: Ambulatory Visit | Attending: Cardiology | Admitting: Cardiology

## 2021-08-03 ENCOUNTER — Other Ambulatory Visit (HOSPITAL_COMMUNITY): Payer: Self-pay | Admitting: Cardiology

## 2021-08-03 ENCOUNTER — Other Ambulatory Visit: Payer: Self-pay

## 2021-08-03 DIAGNOSIS — I714 Abdominal aortic aneurysm, without rupture, unspecified: Secondary | ICD-10-CM

## 2021-08-04 ENCOUNTER — Telehealth: Payer: Self-pay | Admitting: Family Medicine

## 2021-08-04 ENCOUNTER — Telehealth: Payer: Self-pay | Admitting: *Deleted

## 2021-08-04 DIAGNOSIS — M25561 Pain in right knee: Secondary | ICD-10-CM | POA: Diagnosis not present

## 2021-08-04 DIAGNOSIS — I482 Chronic atrial fibrillation, unspecified: Secondary | ICD-10-CM | POA: Diagnosis not present

## 2021-08-04 DIAGNOSIS — G8929 Other chronic pain: Secondary | ICD-10-CM | POA: Diagnosis not present

## 2021-08-04 NOTE — Telephone Encounter (Signed)
Daughter returned Cathys call to let her know that [facility] has already confirmed with her that they carry the lift chairs and order can be sent there.

## 2021-08-04 NOTE — Telephone Encounter (Signed)
LMOVM script for lift chair recliner that I faxed to [facility] I received message back from them that they no longer carry them, she will need to take the script to a retail outlet store. She may want to call her insurance to see where she needs to take this if she is going to use her insurance or maybe [facility] may carry them. Script is at the front desk for her to pick up.

## 2021-08-05 NOTE — Telephone Encounter (Signed)
Order faxed to [facility].  

## 2021-08-10 ENCOUNTER — Other Ambulatory Visit: Payer: Self-pay | Admitting: Family Medicine

## 2021-08-10 ENCOUNTER — Ambulatory Visit: Payer: PPO | Admitting: Nurse Practitioner

## 2021-08-11 ENCOUNTER — Telehealth: Payer: Self-pay | Admitting: Family Medicine

## 2021-08-12 ENCOUNTER — Encounter: Payer: Self-pay | Admitting: Physician Assistant

## 2021-08-12 ENCOUNTER — Other Ambulatory Visit: Payer: Self-pay

## 2021-08-12 ENCOUNTER — Ambulatory Visit: Payer: PPO | Admitting: Physician Assistant

## 2021-08-12 VITALS — BP 110/70 | HR 91 | Resp 20 | Ht 65.0 in | Wt 153.0 lb

## 2021-08-12 DIAGNOSIS — E785 Hyperlipidemia, unspecified: Secondary | ICD-10-CM | POA: Diagnosis not present

## 2021-08-12 DIAGNOSIS — I714 Abdominal aortic aneurysm, without rupture, unspecified: Secondary | ICD-10-CM

## 2021-08-12 DIAGNOSIS — I4821 Permanent atrial fibrillation: Secondary | ICD-10-CM | POA: Diagnosis not present

## 2021-08-12 DIAGNOSIS — R600 Localized edema: Secondary | ICD-10-CM | POA: Diagnosis not present

## 2021-08-12 NOTE — Patient Instructions (Addendum)
Medication Instructions:  Your physician recommends that you continue on your current medications as directed. Please refer to the Current Medication list given to you today.  *If you need a refill on your cardiac medications before your next appointment, please call your pharmacy*  Lab Work: NONE ordered at this time of appointment   If you have labs (blood work) drawn today and your tests are completely normal, you will receive your results only by: [place] (if you have MyChart) OR A paper copy in the mail If you have any lab test that is abnormal or we need to change your treatment, we will call you to review the results.  Testing/Procedures: NONE ordered at this time of appointment   Follow-Up: At [facility], you and your health needs are our priority.  As part of our continuing mission to provide you with exceptional heart care, we have created designated Provider Care Teams.  These Care Teams include your primary Cardiologist (physician) and Advanced Practice Providers (APPs -  Physician Assistants and Nurse Practitioners) who all work together to provide you with the care you need, when you need it.  We recommend signing up for the patient portal called "MyChart".  Sign up information is provided on this After Visit Summary.  MyChart is used to connect with patients for Virtual Visits (Telemedicine).  Patients are able to view lab/test results, encounter notes, upcoming appointments, etc.  Non-urgent messages can be sent to your provider as well.   To learn more about what you can do with MyChart, go to [email or web address].    Your next appointment:   6-9 month(s)  The format for your next appointment:   In Person  Provider:   Glenetta Hew, MD  Other Instructions ELEVATE your legs for swelling and continue to wear compression socks

## 2021-08-12 NOTE — Progress Notes (Signed)
She will Cardiology Office Note:    Date:  08/14/2021   ID:  [name], DOB [birth year], MRN [id number]  PCP:  Janora Norlander, DO   [facility] Providers Cardiologist:  Glenetta Hew, MD     Referring MD: Janora Norlander, DO   Chief Complaint  Patient presents with   Follow-up    Seen for Dr. Ellyn Hack    History of Present Illness:    [name] is a 87 y.o. female with a hx of AAA, bladder cancer, permanent atrial fibrillation on Coumadin, nonobstructive CAD, history of DVT, fatty liver disease and hyperlipidemia.  Last echocardiogram obtained on 03/22/2018 showed EF 55 to 60%, mild LVH, trivial AI, trivial MR, PA peak pressure 31 mmHg.  Patient was last seen by Dr. Ellyn Hack in May 2021 at which time she was doing well.  Heart rate was very well controlled on diltiazem.  She did have lower extremity edema which was attributed to chronic venous stasis.  Last AAA Doppler obtained on 08/03/2021 showed abdominal aorta mildly dilated at 3.2 cm.  Ectatic bilateral common iliac artery measuring at 1.5 cm.  Dimension of dilatation is essentially unchanged when compared to the previous exam.  Patient presents today for follow-up along with one of her daughter.  She lives with 1 daughter and her other daughter live next-door.  She walks around with a cane at home and whenever she goes out, she uses a wheelchair.  INR has been monitored by PCP.  Last INR was 2.5 earlier this month.  Renal function and electrolyte is okay.  Most recent AAA Doppler recommended repeat study next year, by which point she will be 87 years old, I am not clear if she necessarily have to have the study next year.  Otherwise, she denies any chest pain or worsening dyspnea.  She still has bilateral lower extremity nonpitting edema which I also think is related to chronic venous stasis.  I recommended low-salt diet, continue with compression stocking, and leg elevation.  At this time I do not recommend  increasing the diuretic in this 87 year old lady which may increase the chance of dehydration.  Otherwise, she can follow-up with Dr. Ellyn Hack in 6 to 9 months.   Past Medical History:  Diagnosis Date   AAA (abdominal aortic aneurysm) ([place])    Allergy    Anxiety    Bladder cancer ([place]) dx'd 2011   Chronic microscopic hematuria; transitional cell cancer   Blood transfusion 1960   "related to hysterectomy"   Chronic atrial fibrillation ([place])    Anticoagulated with warfarin, rate control with diltiazem and Toprol   Chronic back pain    Chronic headache    "probably 2/wk" (08/28/2015)   Concussion w/o coma 4/78/2956   Complicated by subarachnoid hemorrhage.  "even now has times when she's not able to comprehend" (05/08/12)   Coronary artery disease    Nonobstructive   Diverticulitis    Diverticulosis    DVT (deep venous thrombosis), right 2005   "right calf after 8 foot fall"   Edema of both legs    Chronic, thought to be secondary to DVTs   External hemorrhoids    Family history of adverse reaction to anesthesia 2012   daughter "had OR for crushed hand; had problems w/anesthesia & I was in there all day long" (   Fatty liver    Frequent UTI    GERD (gastroesophageal reflux disease)    H/O hiatal hernia    High cholesterol  Migraine    "rare now" (08/28/2015)   Pneumonia ~ 2010;  2013; 07/2015   Rheumatoid arthritis(714.0)    "hands" (08/28/2015)   Urinary frequency     Past Surgical History:  Procedure Laterality Date   BREAST CYST EXCISION Left 1960's?   2 cysts; benign   CARDIAC CATHETERIZATION  1980's   CATARACT EXTRACTION W/ INTRAOCULAR LENS  IMPLANT, BILATERAL Bilateral 1992   CYSTOSCOPY  11/11/2011   Procedure: CYSTOSCOPY;  Surgeon: Malka So;  Location: [facility];  Service: Urology;  Laterality: N/A;   CYSTOSTOMY W/ BLADDER BIOPSY  10/08/2013   INCONTINENCE SURGERY  1980's   JOINT REPLACEMENT     KNEE ARTHROSCOPY Right 2005   S/P fall   Lower Extremity Venous  Doppler  11/10/2013   No DVT or superficial thrombus enlarged inguinal lymph node noted in the right. No Baker's cyst.   TONSILLECTOMY  1952   TOTAL KNEE ARTHROPLASTY Right 02/05/2013   Procedure: TOTAL KNEE ARTHROPLASTY;  Surgeon: Mauri Pole, MD;  Location: [facility];  Service: Orthopedics;  Laterality: Right;   TRANSTHORACIC ECHOCARDIOGRAM  01/'15; 4/'19   a) EF 60-65%. NO RWMA. Ao Sclerosis. MAC - no MS with mild MR>  B Atriae mildly dilated;; b)  EF 55-60%. AoV Sclerosis (no stenosis).  Trivial AI & MR. Mild LAE & Severe RAE.. Mild RV dilation   TRANSURETHRAL RESECTION OF BLADDER TUMOR  11/11/2011   Procedure: TRANSURETHRAL RESECTION OF BLADDER TUMOR (TURBT);  Surgeon: Malka So;  Location: [facility];  Service: Urology;  Laterality: N/A;  Cysto, Bladder Biopsy, TURBT with Gyrus,    TRANSURETHRAL RESECTION OF BLADDER TUMOR WITH GYRUS (TURBT-GYRUS)  2007; 2009; 2010   "for tumors on surface of bladder"   TUMOR EXCISION  1976; 1980's   "fatty tumor cut off her upper back; left thumb"   VAGINAL HYSTERECTOMY  1960   partial     Current Medications: Current Meds  Medication Sig   atorvastatin (LIPITOR) 10 MG tablet TAKE 1 TABLET BY MOUTH EVERY DAY   baclofen (LIORESAL) 10 MG tablet TAKE 1 TABLET BY MOUTH AT BEDTIME AS NEEDED FOR MUSCLE SPASMS.   calcium carbonate (OS-CAL - DOSED IN MG OF ELEMENTAL CALCIUM) 1250 MG tablet Take 1 tablet by mouth every morning.    cholecalciferol (VITAMIN D) 1000 UNITS tablet Take 1,000 Units by mouth daily.   DILT-XR 240 MG 24 hr capsule TAKE 1 CAPSULE BY MOUTH EVERY MORNING ON AN EMPTY STOMACH   furosemide (LASIX) 20 MG tablet TAKE ONE TO TWO TABLETS EVERY DAY AS INSTRUCTED   loratadine (CLARITIN) 10 MG tablet TAKE 1 TABLET (10 MG TOTAL) BY MOUTH DAILY AS NEEDED FOR ALLERGIES OR ITCHING.   mirtazapine (REMERON) 7.5 MG tablet TAKE 1 TABLET BY MOUTH AT BEDTIME.   Misc. Devices (TOILET SAFETY FRAME) MISC UAD   Multiple Vitamins-Minerals (PRESERVISION AREDS  2+MULTI VIT PO) Take by mouth.   olopatadine (PATANOL) 0.1 % ophthalmic solution PLACE 1 DROP IN AFFECTED EYE(S) TWICE DAILY   Omega-3 Fatty Acids (FISH OIL) 1000 MG CAPS Take 1,000 mg by mouth daily.   PROAIR HFA 108 (90 Base) MCG/ACT inhaler TAKE 2 PUFFS BY MOUTH EVERY 6 HOURS AS NEEDED FOR WHEEZE OR SHORTNESS OF BREATH   Vibegron (GEMTESA) 75 MG TABS Take 1 tablet by mouth daily.   warfarin (COUMADIN) 3 MG tablet TAKE 1 TABLET BY MOUTH EVERY DAY EXCEPT TAKE 2 ON MONDAY, WEDNESDAY, FRIDAY, AND SATURDAY   [DISCONTINUED] omeprazole (PRILOSEC) 20 MG capsule TAKE 1  CAPSULE BY MOUTH EVERY DAY     Allergies:   Contrast media [iodinated diagnostic agents], Amoxicillin, Ampicillin, Cefdinir, Cortisol [hydrocortisone], Ivp dye [iodinated diagnostic agents], Levaquin [levofloxacin], Penicillins, and Vancomycin   Social History   Socioeconomic History   Marital status: Widowed    Spouse name: Not on file   Number of children: 3   Years of education: Not on file   Highest education level: 8th grade  Occupational History   Occupation: Retired  Tobacco Use   Smoking status: Former    Packs/day: 1.00    Years: 40.00    Pack years: 40.00    Types: Cigarettes    Quit date: 11/07/1982    Years since quitting: 38.7   Smokeless tobacco: Former    Types: Snuff  Vaping Use   Vaping Use: Never used  Substance and Sexual Activity   Alcohol use: Yes    Alcohol/week: 7.0 standard drinks    Types: 7 Glasses of wine per week    Comment: 1 glass of wine prior to bed   Drug use: No   Sexual activity: Not Currently    Birth control/protection: Surgical  Other Topics Concern   Not on file  Social History Narrative   ** Merged History Encounter **       She is a widowed mother of 58, grandmother of 61, great grandmother of 21. She does not   really get routine exercise. She quit smoking in 1983. She does not smoke and does not use illicit drugs and does not drink.   Social Determinants of Health    Financial Resource Strain: Not on file  Food Insecurity: Not on file  Transportation Needs: Not on file  Physical Activity: Not on file  Stress: Not on file  Social Connections: Not on file     Family History: The patient's family history includes Anesthesia problems in her daughter; Breast cancer in an other family member; Cancer in her brother, brother, brother, mother, and sister; Heart attack in her sister; Heart disease in her brother; Prostate cancer in her brother. There is no history of Colon cancer.  ROS:   Please see the history of present illness.     All other systems reviewed and are negative.  EKGs/Labs/Other Studies Reviewed:    The following studies were reviewed today:  Echo 03/22/2018 LV EF: 55% -   60%   -------------------------------------------------------------------  Indications:      Atrial Fibrillation (I48.91).   -------------------------------------------------------------------  History:   PMH:  DVT.  Risk factors:  Hypertension.   -------------------------------------------------------------------  Study Conclusions   - Left ventricle: The cavity size was normal. Wall thickness was    increased in a pattern of mild LVH. Systolic function was normal.    The estimated ejection fraction was in the range of 55% to 60%.    Wall motion was normal; there were no regional wall motion    abnormalities. The study is not technically sufficient to allow    evaluation of LV diastolic function.  - Aortic valve: Sclerosis without stenosis. There was trivial    regurgitation.  - Mitral valve: Calcified annulus. Mildly thickened leaflets .    There was trivial regurgitation.  - Left atrium: Mildly dilated.  - Right ventricle: The cavity size was mildly dilated. Systolic    function was normal.  - Right atrium: Severely dilated.  - Tricuspid valve: There was trivial regurgitation.  - Pulmonary arteries: PA peak pressure: 31 mm Hg (S).  - Inferior vena  cava: The vessel was normal in size. The    respirophasic diameter changes were in the normal range (= 50%),    consistent with normal central venous pressure.   Impressions:   - Compared to a prior study in 2015, the LVEF is stable at 55-60%.    There is trivial AI and MR, mild LAE and severe RAE.   EKG:  EKG is ordered today.  The ekg ordered today demonstrates atrial fibrillation, T wave inversion in the lateral leads  Recent Labs: 12/17/2020: ALT 16; Hemoglobin 13.3; Platelets 268; TSH 4.450 07/15/2021: BUN 13; Creatinine, Ser 0.75; Potassium 4.7; Sodium 137  Recent Lipid Panel No results found for: CHOL, TRIG, HDL, CHOLHDL, VLDL, LDLCALC, LDLDIRECT   Risk Assessment/Calculations:    CHA2DS2-VASc Score = 5   This indicates a 7.2% annual risk of stroke. The patient's score is based upon: CHF History: No HTN History: Yes Diabetes History: No Stroke History: No Vascular Disease History: Yes Age Score: 2 Gender Score: 1         Physical Exam:    VS:  BP 110/70   Pulse 91   Resp 20   Ht _0  (1.651 m)   Wt 153 lb (69.4 kg)   SpO2 96%   BMI 25.46 kg/m     Wt Readings from Last 3 Encounters:  08/12/21 153 lb (69.4 kg)  07/15/21 151 lb (68.5 kg)  06/17/21 152 lb (68.9 kg)     GEN:  Well nourished, well developed in no acute distress HEENT: Normal NECK: No JVD; No carotid bruits LYMPHATICS: No lymphadenopathy CARDIAC: Irregularly irregular, no murmurs, rubs, gallops RESPIRATORY:  Clear to auscultation without rales, wheezing or rhonchi  ABDOMEN: Soft, non-tender, non-distended MUSCULOSKELETAL:  1+ nonpitting edema; No deformity  SKIN: Warm and dry NEUROLOGIC:  Alert and oriented x 3 PSYCHIATRIC:  Normal affect   ASSESSMENT:    1. Permanent atrial fibrillation ([place])   2. AAA (abdominal aortic aneurysm) without rupture ([place])   3. Hyperlipidemia LDL goal <70   4. Leg edema    PLAN:    In order of problems listed above:  Permanent atrial fibrillation: INR  therapeutic.  Rate controlled on current therapy  AAA: Recent AAA Doppler suggested dilated iliac and AAA size unchanged.  Recommended repeat Doppler in 1 year, unclear benefit at that point as the patient will be 87 years old at that time.    Hyperlipidemia: Continue Lipitor  Leg edema: Likely related to venous stasis.  Recommended leg elevation, salt restriction and compression stockings.        Medication Adjustments/Labs and Tests Ordered: Current medicines are reviewed at length with the patient today.  Concerns regarding medicines are outlined above.  Orders Placed This Encounter  Procedures   EKG 12-Lead   No orders of the defined types were placed in this encounter.   Patient Instructions  Medication Instructions:  Your physician recommends that you continue on your current medications as directed. Please refer to the Current Medication list given to you today.  *If you need a refill on your cardiac medications before your next appointment, please call your pharmacy*  Lab Work: NONE ordered at this time of appointment   If you have labs (blood work) drawn today and your tests are completely normal, you will receive your results only by: [place] (if you have MyChart) OR A paper copy in the mail If you have any lab test that is abnormal or we need to change your treatment, we  will call you to review the results.  Testing/Procedures: NONE ordered at this time of appointment   Follow-Up: At [facility], you and your health needs are our priority.  As part of our continuing mission to provide you with exceptional heart care, we have created designated Provider Care Teams.  These Care Teams include your primary Cardiologist (physician) and Advanced Practice Providers (APPs -  Physician Assistants and Nurse Practitioners) who all work together to provide you with the care you need, when you need it.  We recommend signing up for the patient portal called "MyChart".   Sign up information is provided on this After Visit Summary.  MyChart is used to connect with patients for Virtual Visits (Telemedicine).  Patients are able to view lab/test results, encounter notes, upcoming appointments, etc.  Non-urgent messages can be sent to your provider as well.   To learn more about what you can do with MyChart, go to [email or web address].    Your next appointment:   6-9 month(s)  The format for your next appointment:   In Person  Provider:   Glenetta Hew, MD  Other Instructions ELEVATE your legs for swelling and continue to wear compression socks     Signed, Almyra Deforest, [place]  08/14/2021 11:58 PM    [place]

## 2021-08-14 ENCOUNTER — Other Ambulatory Visit: Payer: Self-pay | Admitting: Family Medicine

## 2021-08-14 ENCOUNTER — Encounter: Payer: Self-pay | Admitting: Physician Assistant

## 2021-08-14 NOTE — Telephone Encounter (Signed)
Spoke with [employer] and they state that loratadine is a benefit exclusion and would not be covered. FYI

## 2021-08-26 ENCOUNTER — Ambulatory Visit (INDEPENDENT_AMBULATORY_CARE_PROVIDER_SITE_OTHER): Payer: PPO | Admitting: Family Medicine

## 2021-08-26 ENCOUNTER — Encounter: Payer: Self-pay | Admitting: Family Medicine

## 2021-08-26 ENCOUNTER — Other Ambulatory Visit: Payer: Self-pay

## 2021-08-26 VITALS — BP 136/62 | HR 78 | Temp 98.1°F | Ht 65.0 in | Wt 151.4 lb

## 2021-08-26 DIAGNOSIS — Z7901 Long term (current) use of anticoagulants: Secondary | ICD-10-CM

## 2021-08-26 DIAGNOSIS — I482 Chronic atrial fibrillation, unspecified: Secondary | ICD-10-CM

## 2021-08-26 DIAGNOSIS — M67431 Ganglion, right wrist: Secondary | ICD-10-CM

## 2021-08-26 LAB — COAGUCHEK XS/INR WAIVED
INR: 3 — ABNORMAL HIGH (ref 0.9–1.1)
Prothrombin Time: 36.1 s

## 2021-08-26 NOTE — Patient Instructions (Signed)
The spot on your wrist is a ganglion cyst.  Nothing to do about it unless it starts hurting.  Ganglion Cyst A ganglion cyst is a non-cancerous, fluid-filled lump of tissue that occurs near a joint, tendon, or ligament. The cyst grows out of a joint or the lining of a tendon or ligament. Ganglion cysts most often develop in the hand or wrist, but they can also develop in the shoulder, elbow, hip, knee, ankle, or foot. Ganglion cysts are ball-shaped or egg-shaped. Their size can range from the size of a pea to larger than a grape. Increased activity may cause the cyst to get bigger because more fluid starts to build up. What are the causes? The exact cause of this condition is not known, but it may be related to: Inflammation or irritation around the joint. An injury or tear in the layers of tissue around the joint (joint capsule). Repetitive movements or overuse. History of acute or repeated injury. What increases the risk? You are more likely to develop this condition if: You are a female. You are 35-74 years old. What are the signs or symptoms? The main symptom of this condition is a lump. It most often appears on the hand or wrist. In many cases, there are no other symptoms, but a cyst can sometimes cause: Tingling. Pain or tenderness. Numbness. Weakness or loss of strength in the affected joint. Decreased range of motion in the affected area of the body. How is this diagnosed? Ganglion cysts are usually diagnosed based on a physical exam. Your health care provider will feel the lump and may shine a light next to it. If it is a ganglion cyst, the light will likely shine through it. Your health care provider may order an X-ray, ultrasound, MRI, or CT scan to rule out other conditions. How is this treated? Ganglion cysts often go away on their own without treatment. If you have pain or other symptoms, treatment may be needed. Treatment is also needed if the ganglion cyst limits your  movement or if it gets infected. Treatment may include: Wearing a brace or splint on your wrist or finger. Taking anti-inflammatory medicine. Having fluid drained from the lump with a needle (aspiration). Getting an injection of medicine into the joint to decrease inflammation. This may be corticosteroids, ethanol, or hyaluronidase. Having surgery to remove the ganglion cyst. Placing a pad in your shoe or wearing shoes that will not rub against the cyst if it is on your foot. Follow these instructions at home: Do not press on the ganglion cyst, poke it with a needle, or hit it. Take over-the-counter and prescription medicines only as told by your health care provider. If you have a brace or splint: Wear it as told by your health care provider. Remove it as told by your health care provider. Ask if you need to remove it when you take a shower or a bath. Watch your ganglion cyst for any changes. Keep all follow-up visits as told by your health care provider. This is important. Contact a health care provider if: Your ganglion cyst becomes larger or more painful. You have pus coming from the lump. You have weakness or numbness in the affected area. You have a fever or chills. Get help right away if: You have a fever and have any of these in the cyst area: Increased redness. Red streaks. Swelling. Summary A ganglion cyst is a non-cancerous, fluid-filled lump that occurs near a joint, tendon, or ligament. Ganglion cysts most often  develop in the hand or wrist, but they can also develop in the shoulder, elbow, hip, knee, ankle, or foot. Ganglion cysts often go away on their own without treatment. This information is not intended to replace advice given to you by your health care provider. Make sure you discuss any questions you have with your health care provider. Document Revised: 02/20/2020 Document Reviewed: 02/20/2020 Elsevier Patient Education  [place].

## 2021-08-26 NOTE — Progress Notes (Signed)
Subjective: CC: A. fib PCP: Janora Norlander, [name] MU:[id number] [name] is a 87 y.o. female presenting to clinic today for:  1.  A. fib Goal INR 2-3.  She is chronically anticoagulated with last INR therapeutic.  She is here for 6-week follow-up.  She is compliant with her Coumadin.  No bleeding.  No chest pain.  2.  Lump on wrist Patient reports a lump on the lateral aspect of her right wrist.  Does not report any pain, sensory changes or change in grip strength on that side but wanted to make note of it.   ROS: Per HPI  Allergies  Allergen Reactions   Contrast Media [Iodinated Diagnostic Agents] Hives   Amoxicillin Itching   Ampicillin Itching   Cefdinir Nausea And Vomiting   Cortisol [Hydrocortisone] Itching   Ivp Dye [Iodinated Diagnostic Agents] Hives   Levaquin [Levofloxacin] Itching and Rash   Penicillins Rash    "haven't had it in years"   Vancomycin Itching and Rash   Past Medical History:  Diagnosis Date   AAA (abdominal aortic aneurysm) (HCC)    Allergy    Anxiety    Bladder cancer ([place]) dx'd 2011   Chronic microscopic hematuria; transitional cell cancer   Blood transfusion 1960   "related to hysterectomy"   Chronic atrial fibrillation ([place])    Anticoagulated with warfarin, rate control with diltiazem and Toprol   Chronic back pain    Chronic headache    "probably 2/wk" (08/28/2015)   Concussion w/o coma 99991111   Complicated by subarachnoid hemorrhage.  "even now has times when she's not able to comprehend" (05/08/12)   Coronary artery disease    Nonobstructive   Diverticulitis    Diverticulosis    DVT (deep venous thrombosis), right 2005   "right calf after 8 foot fall"   Edema of both legs    Chronic, thought to be secondary to DVTs   External hemorrhoids    Family history of adverse reaction to anesthesia 2012   daughter "had OR for crushed hand; had problems w/anesthesia & I was in there all day long" (   Fatty liver    Frequent UTI     GERD (gastroesophageal reflux disease)    H/O hiatal hernia    High cholesterol    Migraine    "rare now" (08/28/2015)   Pneumonia ~ 2010;  2013; 07/2015   Rheumatoid arthritis(714.0)    "hands" (08/28/2015)   Urinary frequency     Current Outpatient Medications:    atorvastatin (LIPITOR) 10 MG tablet, TAKE 1 TABLET BY MOUTH EVERY DAY, Disp: 90 tablet, Rfl: 0   baclofen (LIORESAL) 10 MG tablet, TAKE 1 TABLET BY MOUTH AT BEDTIME AS NEEDED FOR MUSCLE SPASMS., Disp: 90 tablet, Rfl: 1   calcium carbonate (OS-CAL - DOSED IN MG OF ELEMENTAL CALCIUM) 1250 MG tablet, Take 1 tablet by mouth every morning. , Disp: , Rfl:    cholecalciferol (VITAMIN D) 1000 UNITS tablet, Take 1,000 Units by mouth daily., Disp: , Rfl:    DILT-XR 240 MG 24 hr capsule, TAKE 1 CAPSULE BY MOUTH EVERY MORNING ON AN EMPTY STOMACH, Disp: 90 capsule, Rfl: 0   furosemide (LASIX) 20 MG tablet, TAKE ONE TO TWO TABLETS EVERY DAY AS INSTRUCTED, Disp: 180 tablet, Rfl: 2   loratadine (CLARITIN) 10 MG tablet, TAKE 1 TABLET (10 MG TOTAL) BY MOUTH DAILY AS NEEDED FOR ALLERGIES OR ITCHING., Disp: 30 tablet, Rfl: 2   mirtazapine (REMERON) 7.5 MG tablet, TAKE 1 TABLET  BY MOUTH AT BEDTIME., Disp: 90 tablet, Rfl: 1   Misc. Devices (TOILET SAFETY FRAME) MISC, UAD, Disp: 1 each, Rfl: 0   Multiple Vitamins-Minerals (PRESERVISION AREDS 2+MULTI VIT PO), Take by mouth., Disp: , Rfl:    olopatadine (PATANOL) 0.1 % ophthalmic solution, PLACE 1 DROP IN AFFECTED EYE(S) TWICE DAILY, Disp: 15 mL, Rfl: 5   Omega-3 Fatty Acids (FISH OIL) 1000 MG CAPS, Take 1,000 mg by mouth daily., Disp: , Rfl:    omeprazole (PRILOSEC) 20 MG capsule, TAKE 1 CAPSULE BY MOUTH EVERY DAY, Disp: 90 capsule, Rfl: 0   PROAIR HFA 108 (90 Base) MCG/ACT inhaler, TAKE 2 PUFFS BY MOUTH EVERY 6 HOURS AS NEEDED FOR WHEEZE OR SHORTNESS OF BREATH, Disp: 8.5 Inhaler, Rfl: 4   Vibegron (GEMTESA) 75 MG TABS, Take 1 tablet by mouth daily., Disp: 30 tablet, Rfl: 3   warfarin (COUMADIN) 3 MG  tablet, TAKE 1 TABLET BY MOUTH EVERY DAY EXCEPT TAKE 2 ON MONDAY, WEDNESDAY, FRIDAY, AND SATURDAY, Disp: 140 tablet, Rfl: 2 Social History   Socioeconomic History   Marital status: Widowed    Spouse name: Not on file   Number of children: 3   Years of education: Not on file   Highest education level: 8th grade  Occupational History   Occupation: Retired  Tobacco Use   Smoking status: Former    Packs/day: 1.00    Years: 40.00    Pack years: 40.00    Types: Cigarettes    Quit date: 11/07/1982    Years since quitting: 38.8   Smokeless tobacco: Former    Types: Snuff  Vaping Use   Vaping Use: Never used  Substance and Sexual Activity   Alcohol use: Yes    Alcohol/week: 7.0 standard drinks    Types: 7 Glasses of wine per week    Comment: 1 glass of wine prior to bed   Drug use: No   Sexual activity: Not Currently    Birth control/protection: Surgical  Other Topics Concern   Not on file  Social History Narrative   ** Merged History Encounter **       She is a widowed mother of 26, grandmother of 24, great grandmother of 36. She does not   really get routine exercise. She quit smoking in 1983. She does not smoke and does not use illicit drugs and does not drink.   Social Determinants of Health   Financial Resource Strain: Not on file  Food Insecurity: Not on file  Transportation Needs: Not on file  Physical Activity: Not on file  Stress: Not on file  Social Connections: Not on file  Intimate Partner Violence: Not on file   Family History  Problem Relation Age of Onset   Anesthesia problems Daughter    Cancer Mother        uterine   Cancer Sister        leukemia   Cancer Brother    Prostate cancer Brother    Cancer Brother    Breast cancer Other        neice   Heart disease Brother    Cancer Brother    Heart attack Sister    Colon cancer Neg Hx     Objective: Office vital signs reviewed. BP 136/62   Pulse 78   Temp 98.1 F (36.7 C)   Ht '5\' 5"'$  (1.651 m)    Wt 151 lb 6.4 oz (68.7 kg)   SpO2 96%   BMI 25.19 kg/m   Physical  Examination:  General: Awake, alert, well nourished, No acute distress HEENT: Normal, sclera white, MMM Cardio: regular rate and rhythm, S1S2 heard, no murmurs appreciated Pulm: clear to auscultation bilaterally, no wheezes, rhonchi or rales; normal work of breathing on room air MSK: Well-circumscribed, mobile soft tissue mass consistent with a cyst noted along the lateral aspect of the right wrist  Assessment/ Plan: 87 y.o. female   Chronic atrial fibrillation (HCC)  Chronic anticoagulation - Plan: CoaguChek XS/INR Waived  Ganglion cyst of wrist, right  INR therapeutic at 3.0 today.  Continue current regimen.  Follow-up in 6 weeks  Lesion on the wrist is a ganglion cyst.  Cyst asymptomatic I advised her to [name] nothing.  Orders Placed This Encounter  Procedures   CoaguChek XS/INR Waived   No orders of the defined types were placed in this encounter.    Janora Norlander, [name] [place] [phone]

## 2021-09-04 DIAGNOSIS — M25561 Pain in right knee: Secondary | ICD-10-CM | POA: Diagnosis not present

## 2021-09-04 DIAGNOSIS — I482 Chronic atrial fibrillation, unspecified: Secondary | ICD-10-CM | POA: Diagnosis not present

## 2021-09-04 DIAGNOSIS — G8929 Other chronic pain: Secondary | ICD-10-CM | POA: Diagnosis not present

## 2021-10-02 ENCOUNTER — Other Ambulatory Visit: Payer: Self-pay | Admitting: Family Medicine

## 2021-10-02 DIAGNOSIS — E7800 Pure hypercholesterolemia, unspecified: Secondary | ICD-10-CM

## 2021-10-04 DIAGNOSIS — I482 Chronic atrial fibrillation, unspecified: Secondary | ICD-10-CM | POA: Diagnosis not present

## 2021-10-04 DIAGNOSIS — M25561 Pain in right knee: Secondary | ICD-10-CM | POA: Diagnosis not present

## 2021-10-04 DIAGNOSIS — G8929 Other chronic pain: Secondary | ICD-10-CM | POA: Diagnosis not present

## 2021-10-06 ENCOUNTER — Other Ambulatory Visit: Payer: Self-pay

## 2021-10-06 ENCOUNTER — Encounter: Payer: Self-pay | Admitting: Family Medicine

## 2021-10-06 ENCOUNTER — Ambulatory Visit (INDEPENDENT_AMBULATORY_CARE_PROVIDER_SITE_OTHER): Payer: PPO | Admitting: Family Medicine

## 2021-10-06 VITALS — BP 134/63 | HR 83 | Temp 97.7°F | Ht 65.0 in | Wt 154.8 lb

## 2021-10-06 DIAGNOSIS — H02826 Cysts of left eye, unspecified eyelid: Secondary | ICD-10-CM

## 2021-10-06 DIAGNOSIS — I482 Chronic atrial fibrillation, unspecified: Secondary | ICD-10-CM | POA: Diagnosis not present

## 2021-10-06 DIAGNOSIS — Z7901 Long term (current) use of anticoagulants: Secondary | ICD-10-CM | POA: Diagnosis not present

## 2021-10-06 DIAGNOSIS — R600 Localized edema: Secondary | ICD-10-CM

## 2021-10-06 DIAGNOSIS — Z23 Encounter for immunization: Secondary | ICD-10-CM | POA: Diagnosis not present

## 2021-10-06 LAB — COAGUCHEK XS/INR WAIVED
INR: 3.1 — ABNORMAL HIGH (ref 0.9–1.1)
Prothrombin Time: 37.1 s

## 2021-10-06 NOTE — Progress Notes (Signed)
Subjective: CC:INR PCP: Janora Norlander, DO ZOX:WRUEAVW [name] is a 87 y.o. female presenting to clinic today for:  1. Afib INR goal 2-3 Last seen in September and INR was 3.0.  She was continue on current regimen and is here for interval follow-up.  She is accompanied today's visit by her daughter Neoma Laming.  She does not report any abnormal bleeding.  She has had some increased fluid retention in her legs and continues to see her heart doctor twice per year.  At their instruction she has been taking 2 of her furosemide 3 days/week.  She uses compression hose.  She has chronic right-sided knee pain with history of right knee replacement.  2.  Anxiety Her daughter reports that she seems to worry about everything.  This is despite use of mirtazapine.   Her daughter notes that there are multiple locks on the doors, they have both a cat and a dog which would alert to any intruders and her daughter sleeps very lightly and will be able to hear somebody if needed.  In other words she feels that her anxiety about things is unwarranted.  3.  Dry Patient reports that she has history of dry eye and uses a lubricating drop as well as Patanol.  Her left lower lid feels like it has a little lump in it and she wants me to take a look at this today.   ROS: Per HPI  Allergies  Allergen Reactions   Contrast Media [Iodinated Diagnostic Agents] Hives   Amoxicillin Itching   Ampicillin Itching   Cefdinir Nausea And Vomiting   Cortisol [Hydrocortisone] Itching   Ivp Dye [Iodinated Diagnostic Agents] Hives   Levaquin [Levofloxacin] Itching and Rash   Penicillins Rash    "haven't had it in years"   Vancomycin Itching and Rash   Past Medical History:  Diagnosis Date   AAA (abdominal aortic aneurysm) (HCC)    Allergy    Anxiety    Bladder cancer ([place]) dx'd 2011   Chronic microscopic hematuria; transitional cell cancer   Blood transfusion 1960   "related to hysterectomy"   Chronic atrial  fibrillation ([place])    Anticoagulated with warfarin, rate control with diltiazem and Toprol   Chronic back pain    Chronic headache    "probably 2/wk" (08/28/2015)   Concussion w/o coma 0/98/1191   Complicated by subarachnoid hemorrhage.  "even now has times when she's not able to comprehend" (05/08/12)   Coronary artery disease    Nonobstructive   Diverticulitis    Diverticulosis    DVT (deep venous thrombosis), right 2005   "right calf after 8 foot fall"   Edema of both legs    Chronic, thought to be secondary to DVTs   External hemorrhoids    Family history of adverse reaction to anesthesia 2012   daughter "had OR for crushed hand; had problems w/anesthesia & I was in there all day long" (   Fatty liver    Frequent UTI    GERD (gastroesophageal reflux disease)    H/O hiatal hernia    High cholesterol    Migraine    "rare now" (08/28/2015)   Pneumonia ~ 2010;  2013; 07/2015   Rheumatoid arthritis(714.0)    "hands" (08/28/2015)   Urinary frequency     Current Outpatient Medications:    atorvastatin (LIPITOR) 10 MG tablet, TAKE 1 TABLET BY MOUTH EVERY DAY, Disp: 90 tablet, Rfl: 0   baclofen (LIORESAL) 10 MG tablet, TAKE 1 TABLET BY  MOUTH AT BEDTIME AS NEEDED FOR MUSCLE SPASMS., Disp: 90 tablet, Rfl: 1   calcium carbonate (OS-CAL - DOSED IN MG OF ELEMENTAL CALCIUM) 1250 MG tablet, Take 1 tablet by mouth every morning. , Disp: , Rfl:    cholecalciferol (VITAMIN D) 1000 UNITS tablet, Take 1,000 Units by mouth daily., Disp: , Rfl:    DILT-XR 240 MG 24 hr capsule, TAKE 1 CAPSULE BY MOUTH EVERY MORNING ON AN EMPTY STOMACH, Disp: 90 capsule, Rfl: 0   furosemide (LASIX) 20 MG tablet, TAKE ONE TO TWO TABLETS EVERY DAY AS INSTRUCTED, Disp: 180 tablet, Rfl: 2   loratadine (CLARITIN) 10 MG tablet, TAKE 1 TABLET (10 MG TOTAL) BY MOUTH DAILY AS NEEDED FOR ALLERGIES OR ITCHING., Disp: 30 tablet, Rfl: 2   mirtazapine (REMERON) 7.5 MG tablet, TAKE 1 TABLET BY MOUTH AT BEDTIME., Disp: 90 tablet, Rfl:  1   Misc. Devices (TOILET SAFETY FRAME) MISC, UAD, Disp: 1 each, Rfl: 0   Multiple Vitamins-Minerals (PRESERVISION AREDS 2+MULTI VIT PO), Take by mouth., Disp: , Rfl:    olopatadine (PATANOL) 0.1 % ophthalmic solution, PLACE 1 DROP IN AFFECTED EYE(S) TWICE DAILY, Disp: 15 mL, Rfl: 5   Omega-3 Fatty Acids (FISH OIL) 1000 MG CAPS, Take 1,000 mg by mouth daily., Disp: , Rfl:    omeprazole (PRILOSEC) 20 MG capsule, TAKE 1 CAPSULE BY MOUTH EVERY DAY, Disp: 90 capsule, Rfl: 0   PROAIR HFA 108 (90 Base) MCG/ACT inhaler, TAKE 2 PUFFS BY MOUTH EVERY 6 HOURS AS NEEDED FOR WHEEZE OR SHORTNESS OF BREATH, Disp: 8.5 Inhaler, Rfl: 4   Vibegron (GEMTESA) 75 MG TABS, Take 1 tablet by mouth daily., Disp: 30 tablet, Rfl: 3   warfarin (COUMADIN) 3 MG tablet, TAKE 1 TABLET BY MOUTH EVERY DAY EXCEPT TAKE 2 ON MONDAY, WEDNESDAY, FRIDAY, AND SATURDAY, Disp: 140 tablet, Rfl: 2 Social History   Socioeconomic History   Marital status: Widowed    Spouse name: Not on file   Number of children: 3   Years of education: Not on file   Highest education level: 8th grade  Occupational History   Occupation: Retired  Tobacco Use   Smoking status: Former    Packs/day: 1.00    Years: 40.00    Pack years: 40.00    Types: Cigarettes    Quit date: 11/07/1982    Years since quitting: 38.9   Smokeless tobacco: Former    Types: Snuff  Vaping Use   Vaping Use: Never used  Substance and Sexual Activity   Alcohol use: Yes    Alcohol/week: 7.0 standard drinks    Types: 7 Glasses of wine per week    Comment: 1 glass of wine prior to bed   Drug use: No   Sexual activity: Not Currently    Birth control/protection: Surgical  Other Topics Concern   Not on file  Social History Narrative   ** Merged History Encounter **       She is a widowed mother of 13, grandmother of 62, great grandmother of 28. She does not   really get routine exercise. She quit smoking in 1983. She does not smoke and does not use illicit drugs and does not  drink.   Social Determinants of Health   Financial Resource Strain: Not on file  Food Insecurity: Not on file  Transportation Needs: Not on file  Physical Activity: Not on file  Stress: Not on file  Social Connections: Not on file  Intimate Partner Violence: Not on file  Family History  Problem Relation Age of Onset   Anesthesia problems Daughter    Cancer Mother        uterine   Cancer Sister        leukemia   Cancer Brother    Prostate cancer Brother    Cancer Brother    Breast cancer Other        neice   Heart disease Brother    Cancer Brother    Heart attack Sister    Colon cancer Neg Hx     Objective: Office vital signs reviewed. BP 134/63   Pulse 83   Temp 97.7 F (36.5 C)   Ht 5\' 5"  (1.651 m)   Wt 154 lb 12.8 oz (70.2 kg)   SpO2 95%   BMI 25.76 kg/m   Physical Examination:  General: Awake, alert, well nourished, No acute distress HEENT: Left lower eyelid with visible mucosal cyst  Cardio: Irregularly irregular with rate control.  S1S2 heard, no murmurs appreciated Pulm: clear to auscultation bilaterally, no wheezes, rhonchi or rales; normal work of breathing on room air Extremities: warm, well perfused, 2+ pitting edema to knee and right lower extremity in 1-2+ pitting in the left lower extremity, no cyanosis or clubbing  MSK: Arrives in wheelchair  Assessment/ Plan: 87 y.o. female   Chronic atrial fibrillation ([place]) - Plan: CoaguChek XS/INR Waived  Chronic anticoagulation - Plan: CoaguChek XS/INR Waived  Eyelid cyst, left  Bilateral leg edema  Need for immunization against influenza - Plan: Flu Vaccine QUAD High Dose(Fluad)  Rate controlled.  Continue chronic anticoagulation.  Slightly supratherapeutic with INR of 3.1 for think she could probably get this in the normal range with just a little bit of green/vitamin K intake.  No changes.  May follow-up in 6 weeks  Continue lubrication eyedrops.  No evidence of infection or hordeolum at this  time  Leg edema chronic and stable.  Continue diuretic.  Continue follow-up with cardiology.  Offered referral to lymphedema clinic in [place] but did not want to go just yet  Influenza vaccination administered No orders of the defined types were placed in this encounter.  No orders of the defined types were placed in this encounter.    Janora Norlander, DO [place] [phone]

## 2021-10-21 DIAGNOSIS — R311 Benign essential microscopic hematuria: Secondary | ICD-10-CM | POA: Diagnosis not present

## 2021-10-21 DIAGNOSIS — Z8551 Personal history of malignant neoplasm of bladder: Secondary | ICD-10-CM | POA: Diagnosis not present

## 2021-10-21 DIAGNOSIS — N3941 Urge incontinence: Secondary | ICD-10-CM | POA: Diagnosis not present

## 2021-10-23 ENCOUNTER — Telehealth: Payer: Self-pay | Admitting: Family Medicine

## 2021-10-23 ENCOUNTER — Telehealth (INDEPENDENT_AMBULATORY_CARE_PROVIDER_SITE_OTHER): Payer: PPO | Admitting: Family Medicine

## 2021-10-23 DIAGNOSIS — R5381 Other malaise: Secondary | ICD-10-CM

## 2021-10-23 DIAGNOSIS — Z9181 History of falling: Secondary | ICD-10-CM

## 2021-10-23 DIAGNOSIS — R54 Age-related physical debility: Secondary | ICD-10-CM

## 2021-10-23 DIAGNOSIS — Z789 Other specified health status: Secondary | ICD-10-CM | POA: Diagnosis not present

## 2021-10-23 DIAGNOSIS — Z7409 Other reduced mobility: Secondary | ICD-10-CM | POA: Diagnosis not present

## 2021-10-23 NOTE — Progress Notes (Signed)
MyChart Video visit  Subjective: CC:F2F for [facility] PCP: Janora Norlander, DO WCH:ENIDPOE [name] is a 87 y.o. female. Patient provides verbal consent for consult held via video.  Due to COVID-19 pandemic this visit was conducted virtually. This visit type was conducted due to national recommendations for restrictions regarding the COVID-19 Pandemic (e.g. social distancing, sheltering in place) in an effort to limit this patient's exposure and mitigate transmission in our community. All issues noted in this document were discussed and addressed.  A physical exam was not performed with this format.   Location of patient: home  Location of provider: [facility] Others present for call: daughter  1. Impaired mobility/ adls Requires a lift chair, she's been having difficulty nevertheless.  Her daughter thinks she might need a hospital bed but wants to see if PT/OT might help first.  She admits that has impairment in mobility. Her walking has changed.  She seems to be throwing her right foot way out.  Using a walker currently around the house.   ROS: Per HPI  Allergies  Allergen Reactions   Contrast Media [Iodinated Diagnostic Agents] Hives   Amoxicillin Itching   Ampicillin Itching   Cefdinir Nausea And Vomiting   Cortisol [Hydrocortisone] Itching   Ivp Dye [Iodinated Diagnostic Agents] Hives   Levaquin [Levofloxacin] Itching and Rash   Penicillins Rash    "haven't had it in years"   Vancomycin Itching and Rash   Past Medical History:  Diagnosis Date   AAA (abdominal aortic aneurysm)    Allergy    Anxiety    Bladder cancer ([place]) dx'd 2011   Chronic microscopic hematuria; transitional cell cancer   Blood transfusion 1960   "related to hysterectomy"   Chronic atrial fibrillation ([place])    Anticoagulated with warfarin, rate control with diltiazem and Toprol   Chronic back pain    Chronic headache    "probably 2/wk" (08/28/2015)   Concussion w/o coma 04/04/5360   Complicated by  subarachnoid hemorrhage.  "even now has times when she's not able to comprehend" (05/08/12)   Coronary artery disease    Nonobstructive   Diverticulitis    Diverticulosis    DVT (deep venous thrombosis), right 2005   "right calf after 8 foot fall"   Edema of both legs    Chronic, thought to be secondary to DVTs   External hemorrhoids    Family history of adverse reaction to anesthesia 2012   daughter "had OR for crushed hand; had problems w/anesthesia & I was in there all day long" (   Fatty liver    Frequent UTI    GERD (gastroesophageal reflux disease)    H/O hiatal hernia    High cholesterol    Migraine    "rare now" (08/28/2015)   Pneumonia ~ 2010;  2013; 07/2015   Rheumatoid arthritis(714.0)    "hands" (08/28/2015)   Urinary frequency     Current Outpatient Medications:    atorvastatin (LIPITOR) 10 MG tablet, TAKE 1 TABLET BY MOUTH EVERY DAY, Disp: 90 tablet, Rfl: 0   baclofen (LIORESAL) 10 MG tablet, TAKE 1 TABLET BY MOUTH AT BEDTIME AS NEEDED FOR MUSCLE SPASMS., Disp: 90 tablet, Rfl: 1   calcium carbonate (OS-CAL - DOSED IN MG OF ELEMENTAL CALCIUM) 1250 MG tablet, Take 1 tablet by mouth every morning. , Disp: , Rfl:    cholecalciferol (VITAMIN D) 1000 UNITS tablet, Take 1,000 Units by mouth daily., Disp: , Rfl:    DILT-XR 240 MG 24 hr capsule, TAKE 1  CAPSULE BY MOUTH EVERY MORNING ON AN EMPTY STOMACH, Disp: 90 capsule, Rfl: 0   furosemide (LASIX) 20 MG tablet, TAKE ONE TO TWO TABLETS EVERY DAY AS INSTRUCTED, Disp: 180 tablet, Rfl: 2   loratadine (CLARITIN) 10 MG tablet, TAKE 1 TABLET (10 MG TOTAL) BY MOUTH DAILY AS NEEDED FOR ALLERGIES OR ITCHING., Disp: 30 tablet, Rfl: 2   mirtazapine (REMERON) 7.5 MG tablet, TAKE 1 TABLET BY MOUTH AT BEDTIME., Disp: 90 tablet, Rfl: 1   Misc. Devices (TOILET SAFETY FRAME) MISC, UAD, Disp: 1 each, Rfl: 0   Multiple Vitamins-Minerals (PRESERVISION AREDS 2+MULTI VIT PO), Take by mouth., Disp: , Rfl:    olopatadine (PATANOL) 0.1 % ophthalmic  solution, PLACE 1 DROP IN AFFECTED EYE(S) TWICE DAILY, Disp: 15 mL, Rfl: 5   Omega-3 Fatty Acids (FISH OIL) 1000 MG CAPS, Take 1,000 mg by mouth daily., Disp: , Rfl:    omeprazole (PRILOSEC) 20 MG capsule, TAKE 1 CAPSULE BY MOUTH EVERY DAY, Disp: 90 capsule, Rfl: 0   PROAIR HFA 108 (90 Base) MCG/ACT inhaler, TAKE 2 PUFFS BY MOUTH EVERY 6 HOURS AS NEEDED FOR WHEEZE OR SHORTNESS OF BREATH, Disp: 8.5 Inhaler, Rfl: 4   Vibegron (GEMTESA) 75 MG TABS, Take 1 tablet by mouth daily., Disp: 30 tablet, Rfl: 3   warfarin (COUMADIN) 3 MG tablet, TAKE 1 TABLET BY MOUTH EVERY DAY EXCEPT TAKE 2 ON MONDAY, WEDNESDAY, FRIDAY, AND SATURDAY, Disp: 140 tablet, Rfl: 2  Gen: awake, alert, lying in lift chair   Assessment/ Plan: 87 y.o. female   Frailty syndrome in geriatric patient - Plan: Ambulatory referral to [place]  At high risk for injury related to fall - Plan: Ambulatory referral to Home Health  Physical deconditioning - Plan: Ambulatory referral to [place]  Impaired mobility and activities of daily living - Plan: Ambulatory referral to [place]  Face to face to [facility] PT/OT placed.  Will reassess for need for hospital bed.  She might benefit from a step up to the bed.  Needs strengthening, evaluation re: ambulation devices.  Start time: 2:32pm;2:33pm End time: 2:44pm  Total time spent on patient care (including video visit/ documentation): 11 minutes  [place], [place] [phone]

## 2021-10-23 NOTE — Telephone Encounter (Signed)
Glad to order all of that but she will need a face to face visit (ok to make this on a video visit) in order for her insurance to approve.

## 2021-10-26 ENCOUNTER — Ambulatory Visit (INDEPENDENT_AMBULATORY_CARE_PROVIDER_SITE_OTHER): Payer: PPO | Admitting: Family Medicine

## 2021-10-26 ENCOUNTER — Encounter: Payer: Self-pay | Admitting: Family Medicine

## 2021-10-26 ENCOUNTER — Other Ambulatory Visit: Payer: Self-pay

## 2021-10-26 VITALS — BP 116/70 | HR 80 | Temp 96.9°F

## 2021-10-26 DIAGNOSIS — G8929 Other chronic pain: Secondary | ICD-10-CM | POA: Diagnosis not present

## 2021-10-26 DIAGNOSIS — M545 Low back pain, unspecified: Secondary | ICD-10-CM

## 2021-10-26 DIAGNOSIS — K5900 Constipation, unspecified: Secondary | ICD-10-CM | POA: Diagnosis not present

## 2021-10-26 MED ORDER — POLYETHYLENE GLYCOL 3350 17 G PO PACK: 17.0000 g | PACK | Freq: Two times a day (BID) | ORAL | 0 refills | Status: AC

## 2021-10-26 MED ORDER — BACLOFEN 10 MG PO TABS: 10.0000 mg | ORAL_TABLET | Freq: Three times a day (TID) | ORAL | 0 refills | Status: AC

## 2021-10-26 NOTE — Patient Instructions (Signed)

## 2021-10-26 NOTE — Progress Notes (Signed)
Acute Office Visit  Subjective:    Patient ID: [name], female    DOB: 1921-10-02, 87 y.o.   MRN: [id number]  Chief Complaint  Patient presents with   Back Pain   Constipation    HPI Here with daughter. Patient is in today for back pain. She has a history of chronic lumbar back pain from arthritis. The pain has been worse for the last week. She has been using a veterinary liniment gel to her back as well as her right knee and hand. She has been applying a heating pad on top of the cream to her lower back. She was her oncologist for a follow up of bladder cancer last week and they reports that she did not have a UTI. She has not been taking the baclofen that is on her list, but has used this before with some relief. She has been taking tylenol without improvement. There has been a referral placed for home PT and OT. She denies fever, new injury, numbness, tingling, or changes in bowel or bladder control. She denies flu or URI symptoms.   She has been constipated as well. She has tried a stool softener without improvement. She is having a daily bowel movement but they are small, hard stools. She denies fever, nausea, or vomiting.   Past Medical History:  Diagnosis Date   AAA (abdominal aortic aneurysm)    Allergy    Anxiety    Bladder cancer ([place]) dx'd 2011   Chronic microscopic hematuria; transitional cell cancer   Blood transfusion 1960   "related to hysterectomy"   Chronic atrial fibrillation ([place])    Anticoagulated with warfarin, rate control with diltiazem and Toprol   Chronic back pain    Chronic headache    "probably 2/wk" (08/28/2015)   Concussion w/o coma 3/88/8280   Complicated by subarachnoid hemorrhage.  "even now has times when she's not able to comprehend" (05/08/12)   Coronary artery disease    Nonobstructive   Diverticulitis    Diverticulosis    DVT (deep venous thrombosis), right 2005   "right calf after 8 foot fall"   Edema of both legs    Chronic,  thought to be secondary to DVTs   External hemorrhoids    Family history of adverse reaction to anesthesia 2012   daughter "had OR for crushed hand; had problems w/anesthesia & I was in there all day long" (   Fatty liver    Frequent UTI    GERD (gastroesophageal reflux disease)    H/O hiatal hernia    High cholesterol    Migraine    "rare now" (08/28/2015)   Pneumonia ~ 2010;  2013; 07/2015   Rheumatoid arthritis(714.0)    "hands" (08/28/2015)   Urinary frequency     Past Surgical History:  Procedure Laterality Date   BREAST CYST EXCISION Left 1960's?   2 cysts; benign   CARDIAC CATHETERIZATION  1980's   CATARACT EXTRACTION W/ INTRAOCULAR LENS  IMPLANT, BILATERAL Bilateral 1992   CYSTOSCOPY  11/11/2011   Procedure: CYSTOSCOPY;  Surgeon: Malka So;  Location: [facility];  Service: Urology;  Laterality: N/A;   CYSTOSTOMY W/ BLADDER BIOPSY  10/08/2013   INCONTINENCE SURGERY  1980's   JOINT REPLACEMENT     KNEE ARTHROSCOPY Right 2005   S/P fall   Lower Extremity Venous Doppler  11/10/2013   No DVT or superficial thrombus enlarged inguinal lymph node noted in the right. No Baker's cyst.   TONSILLECTOMY  1952  TOTAL KNEE ARTHROPLASTY Right 02/05/2013   Procedure: TOTAL KNEE ARTHROPLASTY;  Surgeon: Mauri Pole, MD;  Location: [facility];  Service: Orthopedics;  Laterality: Right;   TRANSTHORACIC ECHOCARDIOGRAM  01/'15; 4/'19   a) EF 60-65%. NO RWMA. Ao Sclerosis. MAC - no MS with mild MR>  B Atriae mildly dilated;; b)  EF 55-60%. AoV Sclerosis (no stenosis).  Trivial AI & MR. Mild LAE & Severe RAE.. Mild RV dilation   TRANSURETHRAL RESECTION OF BLADDER TUMOR  11/11/2011   Procedure: TRANSURETHRAL RESECTION OF BLADDER TUMOR (TURBT);  Surgeon: Malka So;  Location: [facility];  Service: Urology;  Laterality: N/A;  Cysto, Bladder Biopsy, TURBT with Gyrus,    TRANSURETHRAL RESECTION OF BLADDER TUMOR WITH GYRUS (TURBT-GYRUS)  2007; 2009; 2010   "for tumors on surface of bladder"   TUMOR  EXCISION  1976; 1980's   "fatty tumor cut off her upper back; left thumb"   VAGINAL HYSTERECTOMY  1960   partial     Family History  Problem Relation Age of Onset   Anesthesia problems Daughter    Cancer Mother        uterine   Cancer Sister        leukemia   Cancer Brother    Prostate cancer Brother    Cancer Brother    Breast cancer Other        neice   Heart disease Brother    Cancer Brother    Heart attack Sister    Colon cancer Neg Hx     Social History   Socioeconomic History   Marital status: Widowed    Spouse name: Not on file   Number of children: 3   Years of education: Not on file   Highest education level: 8th grade  Occupational History   Occupation: Retired  Tobacco Use   Smoking status: Former    Packs/day: 1.00    Years: 40.00    Pack years: 40.00    Types: Cigarettes    Quit date: 11/07/1982    Years since quitting: 38.9   Smokeless tobacco: Former    Types: Snuff  Vaping Use   Vaping Use: Never used  Substance and Sexual Activity   Alcohol use: Yes    Alcohol/week: 7.0 standard drinks    Types: 7 Glasses of wine per week    Comment: 1 glass of wine prior to bed   Drug use: No   Sexual activity: Not Currently    Birth control/protection: Surgical  Other Topics Concern   Not on file  Social History Narrative   ** Merged History Encounter **       She is a widowed mother of 52, grandmother of 75, great grandmother of 64. She does not   really get routine exercise. She quit smoking in 1983. She does not smoke and does not use illicit drugs and does not drink.   Social Determinants of Health   Financial Resource Strain: Not on file  Food Insecurity: Not on file  Transportation Needs: Not on file  Physical Activity: Not on file  Stress: Not on file  Social Connections: Not on file  Intimate Partner Violence: Not on file    Outpatient Medications Prior to Visit  Medication Sig Dispense Refill   atorvastatin (LIPITOR) 10 MG tablet TAKE  1 TABLET BY MOUTH EVERY DAY 90 tablet 0   baclofen (LIORESAL) 10 MG tablet TAKE 1 TABLET BY MOUTH AT BEDTIME AS NEEDED FOR MUSCLE SPASMS. 90 tablet 1  calcium carbonate (OS-CAL - DOSED IN MG OF ELEMENTAL CALCIUM) 1250 MG tablet Take 1 tablet by mouth every morning.      cholecalciferol (VITAMIN D) 1000 UNITS tablet Take 1,000 Units by mouth daily.     DILT-XR 240 MG 24 hr capsule TAKE 1 CAPSULE BY MOUTH EVERY MORNING ON AN EMPTY STOMACH 90 capsule 0   furosemide (LASIX) 20 MG tablet TAKE ONE TO TWO TABLETS EVERY DAY AS INSTRUCTED 180 tablet 2   loratadine (CLARITIN) 10 MG tablet TAKE 1 TABLET (10 MG TOTAL) BY MOUTH DAILY AS NEEDED FOR ALLERGIES OR ITCHING. 30 tablet 2   mirtazapine (REMERON) 7.5 MG tablet TAKE 1 TABLET BY MOUTH AT BEDTIME. 90 tablet 1   Misc. Devices (TOILET SAFETY FRAME) MISC UAD 1 each 0   Multiple Vitamins-Minerals (PRESERVISION AREDS 2+MULTI VIT PO) Take by mouth.     olopatadine (PATANOL) 0.1 % ophthalmic solution PLACE 1 DROP IN AFFECTED EYE(S) TWICE DAILY 15 mL 5   Omega-3 Fatty Acids (FISH OIL) 1000 MG CAPS Take 1,000 mg by mouth daily.     omeprazole (PRILOSEC) 20 MG capsule TAKE 1 CAPSULE BY MOUTH EVERY DAY 90 capsule 0   PROAIR HFA 108 (90 Base) MCG/ACT inhaler TAKE 2 PUFFS BY MOUTH EVERY 6 HOURS AS NEEDED FOR WHEEZE OR SHORTNESS OF BREATH 8.5 Inhaler 4   Vibegron (GEMTESA) 75 MG TABS Take 1 tablet by mouth daily. 30 tablet 3   warfarin (COUMADIN) 3 MG tablet TAKE 1 TABLET BY MOUTH EVERY DAY EXCEPT TAKE 2 ON MONDAY, WEDNESDAY, FRIDAY, AND SATURDAY 140 tablet 2   No facility-administered medications prior to visit.    Allergies  Allergen Reactions   Contrast Media [Iodinated Diagnostic Agents] Hives   Amoxicillin Itching   Ampicillin Itching   Cefdinir Nausea And Vomiting   Cortisol [Hydrocortisone] Itching   Ivp Dye [Iodinated Diagnostic Agents] Hives   Levaquin [Levofloxacin] Itching and Rash   Penicillins Rash    "haven't had it in years"   Vancomycin  Itching and Rash    Review of Systems As per HPI.     Objective:    Physical Exam Vitals and nursing note reviewed.  Constitutional:      Appearance: Normal appearance. She is not ill-appearing, toxic-appearing or diaphoretic.  Cardiovascular:     Rate and Rhythm: Normal rate and regular rhythm.     Heart sounds: Normal heart sounds. No murmur heard. Pulmonary:     Effort: Pulmonary effort is normal. No respiratory distress.     Breath sounds: Normal breath sounds.  Abdominal:     General: There is no distension.     Palpations: Abdomen is soft.     Tenderness: There is no abdominal tenderness. There is no guarding or rebound.  Skin:    Findings: Erythema (lower back, no edema, warmth or tenderness) present.  Neurological:     Mental Status: She is alert and oriented to person, place, and time.     Motor: Weakness (generalized) present.     Gait: Gait abnormal (wheelchair).    BP 116/70   Pulse 80   Temp (!) 96.9 F (36.1 C) (Temporal)   SpO2 96%  Wt Readings from Last 3 Encounters:  10/06/21 154 lb 12.8 oz (70.2 kg)  08/26/21 151 lb 6.4 oz (68.7 kg)  08/12/21 153 lb (69.4 kg)    Health Maintenance Due  Topic Date Due   COVID-19 Vaccine (2 - Pfizer risk series) 04/15/2020    There are no preventive care  reminders to display for this patient.   Lab Results  Component Value Date   TSH 4.450 12/17/2020   Lab Results  Component Value Date   WBC 8.0 12/17/2020   HGB 13.3 12/17/2020   HCT 39.9 12/17/2020   MCV 96 12/17/2020   PLT 268 12/17/2020   Lab Results  Component Value Date   NA 137 07/15/2021   K 4.7 07/15/2021   CO2 22 07/15/2021   GLUCOSE 103 (H) 07/15/2021   BUN 13 07/15/2021   CREATININE 0.75 07/15/2021   BILITOT 0.4 12/17/2020   ALKPHOS 102 12/17/2020   AST 19 12/17/2020   ALT 16 12/17/2020   PROT 6.7 12/17/2020   ALBUMIN 4.3 12/17/2020   CALCIUM 9.0 07/15/2021   ANIONGAP 13 12/18/2019   EGFR 72 07/15/2021   GFR 69.67 06/11/2013    No results found for: CHOL No results found for: HDL No results found for: LDLCALC No results found for: TRIG No results found for: CHOLHDL Lab Results  Component Value Date   HGBA1C (H) 09/18/2007    6.3 (NOTE)   The ADA recommends the following therapeutic goals for glycemic   control related to Hgb A1C measurement:   Goal of Therapy:   < 7.0% Hgb A1C   Action Suggested:  > 8.0% Hgb A1C   Ref:  Diabetes Care, 22, Suppl. 1, 1999       Assessment & Plan:   Bostyn was seen today for back pain and constipation.  Diagnoses and all orders for this visit:  Constipation, unspecified constipation type Daily small, hard bowel movements. Denies nausea or vomiting. Has been taking stool softeners. Miralax as below.  -     polyethylene glycol (MIRALAX / GLYCOLAX) 17 g packet; Take 17 g by mouth 2 (two) times daily for 7 days.  Chronic low back pain without sciatica, unspecified back pain laterality Discussed not to use horse creams. Discussed not to apply heating pad over creams or ointments as this can use a burn. Slightly erythema to lower back today, no skin breakdown or signs of infection. Will refill baclofen as this has been helpful in the past.  -     baclofen (LIORESAL) 10 MG tablet; Take 1 tablet (10 mg total) by mouth 3 (three) times daily.  Return to office for new or worsening symptoms, or if symptoms persist.   The patient indicates understanding of these issues and agrees with the plan.  Gwenlyn Perking, FNP

## 2021-10-29 ENCOUNTER — Telehealth: Payer: Self-pay | Admitting: Family Medicine

## 2021-10-29 DIAGNOSIS — R110 Nausea: Secondary | ICD-10-CM | POA: Diagnosis not present

## 2021-10-29 DIAGNOSIS — K5900 Constipation, unspecified: Secondary | ICD-10-CM | POA: Diagnosis not present

## 2021-10-29 NOTE — Telephone Encounter (Signed)
Heidi called from [facility] stating that [facility] did not receive referral for pt to have OT or PT. Wants to resend to fax # [phone].

## 2021-11-02 NOTE — Telephone Encounter (Signed)
Unable to return call to [facility] w/ [facility], no number recorded w/n encounter Reached out to [facility] our referral coordinator, she does have noted that she sent a comm mssg to [employer] on 10/28/21, got a response of acceptance today & information was faxed.

## 2021-11-04 DIAGNOSIS — I482 Chronic atrial fibrillation, unspecified: Secondary | ICD-10-CM | POA: Diagnosis not present

## 2021-11-04 DIAGNOSIS — M25561 Pain in right knee: Secondary | ICD-10-CM | POA: Diagnosis not present

## 2021-11-04 DIAGNOSIS — G8929 Other chronic pain: Secondary | ICD-10-CM | POA: Diagnosis not present

## 2021-11-10 DIAGNOSIS — Z8551 Personal history of malignant neoplasm of bladder: Secondary | ICD-10-CM | POA: Diagnosis not present

## 2021-11-11 DIAGNOSIS — R110 Nausea: Secondary | ICD-10-CM | POA: Diagnosis not present

## 2021-11-11 DIAGNOSIS — K5900 Constipation, unspecified: Secondary | ICD-10-CM | POA: Diagnosis not present

## 2021-11-12 DIAGNOSIS — E785 Hyperlipidemia, unspecified: Secondary | ICD-10-CM | POA: Diagnosis not present

## 2021-11-12 DIAGNOSIS — K5900 Constipation, unspecified: Secondary | ICD-10-CM | POA: Diagnosis not present

## 2021-11-12 DIAGNOSIS — M4696 Unspecified inflammatory spondylopathy, lumbar region: Secondary | ICD-10-CM | POA: Diagnosis not present

## 2021-11-12 DIAGNOSIS — Z8651 Personal history of combat and operational stress reaction: Secondary | ICD-10-CM | POA: Diagnosis not present

## 2021-11-12 DIAGNOSIS — Z7901 Long term (current) use of anticoagulants: Secondary | ICD-10-CM | POA: Diagnosis not present

## 2021-11-12 DIAGNOSIS — R54 Age-related physical debility: Secondary | ICD-10-CM | POA: Diagnosis not present

## 2021-11-12 DIAGNOSIS — I4891 Unspecified atrial fibrillation: Secondary | ICD-10-CM | POA: Diagnosis not present

## 2021-11-12 DIAGNOSIS — M5459 Other low back pain: Secondary | ICD-10-CM | POA: Diagnosis not present

## 2021-11-15 ENCOUNTER — Other Ambulatory Visit: Payer: Self-pay | Admitting: Family Medicine

## 2021-11-15 DIAGNOSIS — R630 Anorexia: Secondary | ICD-10-CM

## 2021-11-15 DIAGNOSIS — F32A Depression, unspecified: Secondary | ICD-10-CM

## 2021-11-15 DIAGNOSIS — R634 Abnormal weight loss: Secondary | ICD-10-CM

## 2021-11-23 ENCOUNTER — Ambulatory Visit (INDEPENDENT_AMBULATORY_CARE_PROVIDER_SITE_OTHER): Payer: PPO

## 2021-11-23 ENCOUNTER — Other Ambulatory Visit: Payer: Self-pay

## 2021-11-23 DIAGNOSIS — R54 Age-related physical debility: Secondary | ICD-10-CM

## 2021-11-23 DIAGNOSIS — M5459 Other low back pain: Secondary | ICD-10-CM

## 2021-11-23 DIAGNOSIS — E785 Hyperlipidemia, unspecified: Secondary | ICD-10-CM

## 2021-11-23 DIAGNOSIS — K5900 Constipation, unspecified: Secondary | ICD-10-CM

## 2021-11-23 DIAGNOSIS — I4891 Unspecified atrial fibrillation: Secondary | ICD-10-CM

## 2021-11-23 DIAGNOSIS — M4696 Unspecified inflammatory spondylopathy, lumbar region: Secondary | ICD-10-CM | POA: Diagnosis not present

## 2021-11-23 DIAGNOSIS — Z7901 Long term (current) use of anticoagulants: Secondary | ICD-10-CM

## 2021-11-24 ENCOUNTER — Telehealth: Payer: Self-pay | Admitting: Family Medicine

## 2021-11-24 NOTE — Telephone Encounter (Signed)
Pt daughter will get more details about what to order

## 2021-11-24 NOTE — Telephone Encounter (Signed)
If her OT can send me exactly the order she thinks the pt needs, I'll be glad to sign it.  I am not familiar with the railing she is talking about.  I will complete the letter for pick up today for the power company.

## 2021-12-04 DIAGNOSIS — I482 Chronic atrial fibrillation, unspecified: Secondary | ICD-10-CM | POA: Diagnosis not present

## 2021-12-04 DIAGNOSIS — M25561 Pain in right knee: Secondary | ICD-10-CM | POA: Diagnosis not present

## 2021-12-04 DIAGNOSIS — G8929 Other chronic pain: Secondary | ICD-10-CM | POA: Diagnosis not present

## 2021-12-10 ENCOUNTER — Other Ambulatory Visit: Payer: Self-pay | Admitting: *Deleted

## 2021-12-10 DIAGNOSIS — I482 Chronic atrial fibrillation, unspecified: Secondary | ICD-10-CM

## 2021-12-11 ENCOUNTER — Ambulatory Visit (INDEPENDENT_AMBULATORY_CARE_PROVIDER_SITE_OTHER): Payer: PPO | Admitting: Family Medicine

## 2021-12-11 DIAGNOSIS — R791 Abnormal coagulation profile: Secondary | ICD-10-CM

## 2021-12-11 DIAGNOSIS — I482 Chronic atrial fibrillation, unspecified: Secondary | ICD-10-CM | POA: Diagnosis not present

## 2021-12-11 LAB — COAGUCHEK XS/INR WAIVED
INR: 1.9 — ABNORMAL HIGH (ref 0.9–1.1)
Prothrombin Time: 23 s

## 2021-12-11 NOTE — Progress Notes (Signed)
Telephone visit  Subjective: CC: INR check PCP: Janora Norlander, DO MHD:[id number] [name] is a 87 y.o. female calls for telephone consult today. Patient provides verbal consent for consult held via phone.  Due to COVID-19 pandemic this visit was conducted virtually. This visit type was conducted due to national recommendations for restrictions regarding the COVID-19 Pandemic (Adriana.g. social distancing, sheltering in place) in an effort to limit this patient's exposure and mitigate transmission in our community. All issues noted in this document were discussed and addressed.  A physical exam was not performed with this format.   Location of patient: car Location of provider: [facility] Others present for call: [name] [phone]  1.  Atrial fibrillation Patient is compliant with 2 tablets of Coumadin Mondays Wednesdays and Fridays and 1 tablet daily all others.  She was last seen in October and her INR was minimally supratherapeutic at that time.  She accidentally did take 2 tablets yesterday, thinking it was Friday.  No reports of bleeding.   ROS: Per HPI  Allergies  Allergen Reactions   Contrast Media [Iodinated Contrast Media] Hives   Amoxicillin Itching   Ampicillin Itching   Cefdinir Nausea And Vomiting   Cortisol [Hydrocortisone] Itching   Ivp Dye [Iodinated Contrast Media] Hives   Levaquin [Levofloxacin] Itching and Rash   Penicillins Rash    "haven't had it in years"   Vancomycin Itching and Rash   Past Medical History:  Diagnosis Date   AAA (abdominal aortic aneurysm)    Allergy    Anxiety    Bladder cancer ([place]) dx'd 2011   Chronic microscopic hematuria; transitional cell cancer   Blood transfusion 1960   "related to hysterectomy"   Chronic atrial fibrillation ([place])    Anticoagulated with warfarin, rate control with diltiazem and Toprol   Chronic back pain    Chronic headache    "probably 2/wk" (08/28/2015)   Concussion w/o coma 0/81/4481   Complicated by  subarachnoid hemorrhage.  "even now has times when she's not able to comprehend" (05/08/12)   Coronary artery disease    Nonobstructive   Diverticulitis    Diverticulosis    DVT (deep venous thrombosis), right 2005   "right calf after 8 foot fall"   Edema of both legs    Chronic, thought to be secondary to DVTs   External hemorrhoids    Family history of adverse reaction to anesthesia 2012   daughter "had OR for crushed hand; had problems w/anesthesia & I was in there all day long" (   Fatty liver    Frequent UTI    GERD (gastroesophageal reflux disease)    H/O hiatal hernia    High cholesterol    Migraine    "rare now" (08/28/2015)   Pneumonia ~ 2010;  2013; 07/2015   Rheumatoid arthritis(714.0)    "hands" (08/28/2015)   Urinary frequency     Current Outpatient Medications:    atorvastatin (LIPITOR) 10 MG tablet, TAKE 1 TABLET BY MOUTH EVERY DAY, Disp: 90 tablet, Rfl: 0   baclofen (LIORESAL) 10 MG tablet, Take 1 tablet (10 mg total) by mouth 3 (three) times daily., Disp: 60 each, Rfl: 0   calcium carbonate (OS-CAL - DOSED IN MG OF ELEMENTAL CALCIUM) 1250 MG tablet, Take 1 tablet by mouth every morning. , Disp: , Rfl:    cholecalciferol (VITAMIN D) 1000 UNITS tablet, Take 1,000 Units by mouth daily., Disp: , Rfl:    DILT-XR 240 MG 24 hr capsule, TAKE 1 CAPSULE BY MOUTH EVERY  MORNING ON AN EMPTY STOMACH, Disp: 90 capsule, Rfl: 0   furosemide (LASIX) 20 MG tablet, TAKE ONE TO TWO TABLETS EVERY DAY AS INSTRUCTED, Disp: 180 tablet, Rfl: 2   loratadine (CLARITIN) 10 MG tablet, TAKE 1 TABLET (10 MG TOTAL) BY MOUTH DAILY AS NEEDED FOR ALLERGIES OR ITCHING., Disp: 90 tablet, Rfl: 1   mirtazapine (REMERON) 7.5 MG tablet, TAKE 1 TABLET BY MOUTH EVERYDAY AT BEDTIME, Disp: 90 tablet, Rfl: 0   Misc. Devices (TOILET SAFETY FRAME) MISC, UAD, Disp: 1 each, Rfl: 0   Multiple Vitamins-Minerals (PRESERVISION AREDS 2+MULTI VIT PO), Take by mouth., Disp: , Rfl:    olopatadine (PATANOL) 0.1 % ophthalmic  solution, PLACE 1 DROP IN AFFECTED EYE(S) TWICE DAILY, Disp: 15 mL, Rfl: 5   Omega-3 Fatty Acids (FISH OIL) 1000 MG CAPS, Take 1,000 mg by mouth daily., Disp: , Rfl:    omeprazole (PRILOSEC) 20 MG capsule, TAKE 1 CAPSULE BY MOUTH EVERY DAY, Disp: 90 capsule, Rfl: 0   PROAIR HFA 108 (90 Base) MCG/ACT inhaler, TAKE 2 PUFFS BY MOUTH EVERY 6 HOURS AS NEEDED FOR WHEEZE OR SHORTNESS OF BREATH, Disp: 8.5 Inhaler, Rfl: 4   Vibegron (GEMTESA) 75 MG TABS, Take 1 tablet by mouth daily., Disp: 30 tablet, Rfl: 3   warfarin (COUMADIN) 3 MG tablet, TAKE 1 TABLET BY MOUTH EVERY DAY EXCEPT TAKE 2 ON MONDAY, WEDNESDAY, FRIDAY, AND SATURDAY, Disp: 140 tablet, Rfl: 2  Assessment/ Plan: 87 y.o. female   Subtherapeutic international normalized ratio (INR)  Chronic atrial fibrillation ([place]) - Plan: CoaguChek XS/INR Waived  She will take 2 tablets today then resume 2 tablets Mondays Wednesdays and Fridays with 1 tablet daily all other days.  We will see each other again in 2 weeks for recheck.  She is aware of date and time on 12/25/2021 at 11 AM.  Start time: 11:16a End time: 11:22a  Total time spent on patient care (including telephone call/ virtual visit): 6 minutes  [place], [place] [phone]

## 2021-12-25 ENCOUNTER — Ambulatory Visit (INDEPENDENT_AMBULATORY_CARE_PROVIDER_SITE_OTHER): Payer: PPO | Admitting: Family Medicine

## 2021-12-25 VITALS — BP 138/64 | HR 83 | Temp 97.5°F | Ht 65.0 in | Wt 143.0 lb

## 2021-12-25 DIAGNOSIS — H0011 Chalazion right upper eyelid: Secondary | ICD-10-CM | POA: Diagnosis not present

## 2021-12-25 DIAGNOSIS — I482 Chronic atrial fibrillation, unspecified: Secondary | ICD-10-CM

## 2021-12-25 DIAGNOSIS — F32A Depression, unspecified: Secondary | ICD-10-CM

## 2021-12-25 DIAGNOSIS — F419 Anxiety disorder, unspecified: Secondary | ICD-10-CM | POA: Diagnosis not present

## 2021-12-25 LAB — COAGUCHEK XS/INR WAIVED
INR: 2.2 — ABNORMAL HIGH (ref 0.9–1.1)
Prothrombin Time: 26.7 s

## 2021-12-25 LAB — HEMOGLOBIN, FINGERSTICK: Hemoglobin: 12.8 g/dL (ref 11.1–15.9)

## 2021-12-25 NOTE — Progress Notes (Signed)
Subjective: CC: Follow-up subtherapeutic INR PCP: Janora Norlander, DO MLY:YTKPTWS E Mandt is a 88 y.o. female presenting to clinic today for:  1.  Chronic atrial fibrillation with recent subtherapeutic INR Patient's INR was noted to be subtherapeutic at her last visit on 12/11/2021.  She was advised to increase to 2 tablets that day then resume normal dosing to 2 tablets daily on Mondays Wednesdays and Fridays, with 1 tablet daily all others. Went into afib last night.  BP appeared to fluctuate.  2.  [place] Daughter notes that there is a lesion on her mother's right eyelid.  Wondering if this might be a stye.  Patient is somewhat sore.  No reports of visual disturbance  3.  Anxiety, depression, weight loss Her daughter voices concern about weight loss.  Her appetite has not been that great.  She apparently has not been compliant with mirtazapine 7.5 mg that was prescribed previously.  No bleeding reported.   ROS: Per HPI  Allergies  Allergen Reactions   Contrast Media [Iodinated Contrast Media] Hives   Amoxicillin Itching   Ampicillin Itching   Cefdinir Nausea And Vomiting   Cortisol [Hydrocortisone] Itching   Ivp Dye [Iodinated Contrast Media] Hives   Levaquin [Levofloxacin] Itching and Rash   Penicillins Rash    "haven't had it in years"   Vancomycin Itching and Rash   Past Medical History:  Diagnosis Date   AAA (abdominal aortic aneurysm)    Allergy    Anxiety    Bladder cancer ([place]) dx'd 2011   Chronic microscopic hematuria; transitional cell cancer   Blood transfusion 1960   "related to hysterectomy"   Chronic atrial fibrillation ([place])    Anticoagulated with warfarin, rate control with diltiazem and Toprol   Chronic back pain    Chronic headache    "probably 2/wk" (08/28/2015)   Concussion w/o coma 5/68/1275   Complicated by subarachnoid hemorrhage.  "even now has times when she's not able to comprehend" (05/08/12)   Coronary artery disease     Nonobstructive   Diverticulitis    Diverticulosis    DVT (deep venous thrombosis), right 2005   "right calf after 8 foot fall"   Edema of both legs    Chronic, thought to be secondary to DVTs   External hemorrhoids    Family history of adverse reaction to anesthesia 2012   daughter "had OR for crushed hand; had problems w/anesthesia & I was in there all day long" (   Fatty liver    Frequent UTI    GERD (gastroesophageal reflux disease)    H/O hiatal hernia    High cholesterol    Migraine    "rare now" (08/28/2015)   Pneumonia ~ 2010;  2013; 07/2015   Rheumatoid arthritis(714.0)    "hands" (08/28/2015)   Urinary frequency     Current Outpatient Medications:    atorvastatin (LIPITOR) 10 MG tablet, TAKE 1 TABLET BY MOUTH EVERY DAY, Disp: 90 tablet, Rfl: 0   baclofen (LIORESAL) 10 MG tablet, Take 1 tablet (10 mg total) by mouth 3 (three) times daily., Disp: 60 each, Rfl: 0   calcium carbonate (OS-CAL - DOSED IN MG OF ELEMENTAL CALCIUM) 1250 MG tablet, Take 1 tablet by mouth every morning. , Disp: , Rfl:    cholecalciferol (VITAMIN D) 1000 UNITS tablet, Take 1,000 Units by mouth daily., Disp: , Rfl:    DILT-XR 240 MG 24 hr capsule, TAKE 1 CAPSULE BY MOUTH EVERY MORNING ON AN EMPTY STOMACH, Disp: 90 capsule,  Rfl: 0   furosemide (LASIX) 20 MG tablet, TAKE ONE TO TWO TABLETS EVERY DAY AS INSTRUCTED, Disp: 180 tablet, Rfl: 2   loratadine (CLARITIN) 10 MG tablet, TAKE 1 TABLET (10 MG TOTAL) BY MOUTH DAILY AS NEEDED FOR ALLERGIES OR ITCHING., Disp: 90 tablet, Rfl: 1   mirtazapine (REMERON) 7.5 MG tablet, TAKE 1 TABLET BY MOUTH EVERYDAY AT BEDTIME, Disp: 90 tablet, Rfl: 0   Misc. Devices (TOILET SAFETY FRAME) MISC, UAD, Disp: 1 each, Rfl: 0   Multiple Vitamins-Minerals (PRESERVISION AREDS 2+MULTI VIT PO), Take by mouth., Disp: , Rfl:    olopatadine (PATANOL) 0.1 % ophthalmic solution, PLACE 1 DROP IN AFFECTED EYE(S) TWICE DAILY, Disp: 15 mL, Rfl: 5   Omega-3 Fatty Acids (FISH OIL) 1000 MG CAPS,  Take 1,000 mg by mouth daily., Disp: , Rfl:    omeprazole (PRILOSEC) 20 MG capsule, TAKE 1 CAPSULE BY MOUTH EVERY DAY, Disp: 90 capsule, Rfl: 0   PROAIR HFA 108 (90 Base) MCG/ACT inhaler, TAKE 2 PUFFS BY MOUTH EVERY 6 HOURS AS NEEDED FOR WHEEZE OR SHORTNESS OF BREATH, Disp: 8.5 Inhaler, Rfl: 4   Vibegron (GEMTESA) 75 MG TABS, Take 1 tablet by mouth daily., Disp: 30 tablet, Rfl: 3   warfarin (COUMADIN) 3 MG tablet, TAKE 1 TABLET BY MOUTH EVERY DAY EXCEPT TAKE 2 ON MONDAY, WEDNESDAY, FRIDAY, AND SATURDAY, Disp: 140 tablet, Rfl: 2 Social History   Socioeconomic History   Marital status: Widowed    Spouse name: Not on file   Number of children: 3   Years of education: Not on file   Highest education level: 8th grade  Occupational History   Occupation: Retired  Tobacco Use   Smoking status: Former    Packs/day: 1.00    Years: 40.00    Pack years: 40.00    Types: Cigarettes    Quit date: 11/07/1982    Years since quitting: 39.1   Smokeless tobacco: Former    Types: Snuff  Vaping Use   Vaping Use: Never used  Substance and Sexual Activity   Alcohol use: Yes    Alcohol/week: 7.0 standard drinks    Types: 7 Glasses of wine per week    Comment: 1 glass of wine prior to bed   Drug use: No   Sexual activity: Not Currently    Birth control/protection: Surgical  Other Topics Concern   Not on file  Social History Narrative   ** Merged History Encounter **       She is a widowed mother of 84, grandmother of 52, great grandmother of 72. She does not   really get routine exercise. She quit smoking in 1983. She does not smoke and does not use illicit drugs and does not drink.   Social Determinants of Health   Financial Resource Strain: Not on file  Food Insecurity: Not on file  Transportation Needs: Not on file  Physical Activity: Not on file  Stress: Not on file  Social Connections: Not on file  Intimate Partner Violence: Not on file   Family History  Problem Relation Age of Onset    Anesthesia problems Daughter    Cancer Mother        uterine   Cancer Sister        leukemia   Cancer Brother    Prostate cancer Brother    Cancer Brother    Breast cancer Other        neice   Heart disease Brother    Cancer Brother  Heart attack Sister    Colon cancer Neg Hx     Objective: Office vital signs reviewed. BP 138/64    Pulse 83    Temp (!) 97.5 F (36.4 C) (Temporal)    Ht 5\' 5"  (1.651 m)    Wt 143 lb (64.9 kg)    SpO2 94%    BMI 23.80 kg/m   Physical Examination:  General: Awake, alert, nontoxic-appearing elderly female, No acute distress HEENT: Right upper eyelid with soft tissue swelling consistent with chalazion.  No drainage, no conjunctival injection.  PERRLA.  EOMI Cardio: Irregularly irregular with rate controlled. Pulm: clear to auscultation bilaterally, no wheezes, rhonchi or rales; normal work of breathing on room air GI: soft, non-tender, non-distended, bowel sounds present x4, no hepatomegaly, no splenomegaly, no masses Depression screen [facility] 2/9 12/25/2021 10/06/2021 08/26/2021  Decreased Interest 2 1 0  Down, Depressed, Hopeless 2 1 0  PHQ - 2 Score 4 2 0  Altered sleeping 1 1 0  Tired, decreased energy 1 1 0  Change in appetite 1 1 0  Feeling bad or failure about yourself  0 1 0  Trouble concentrating 1 1 0  Moving slowly or fidgety/restless 1 1 0  Suicidal thoughts 0 1 0  PHQ-9 Score 9 9 0  Difficult doing work/chores Somewhat difficult Somewhat difficult Not difficult at all  Some recent data might be hidden   GAD 7 : Generalized Anxiety Score 12/25/2021 10/06/2021 08/26/2021 07/15/2021  Nervous, Anxious, on Edge 2 1 0 1  Control/stop worrying 2 1 0 1  Worry too much - different things 2 1 0 1  Trouble relaxing 1 1 0 3  Restless 1 1 0 1  Easily annoyed or irritable 1 0 0 0  Afraid - awful might happen 2 1 0 2  Total GAD 7 Score 11 6 0 9  Anxiety Difficulty Somewhat difficult Somewhat difficult Not difficult at all Somewhat difficult       Assessment/ Plan: 88 y.o. female   Chronic atrial fibrillation ([place]) - Plan: CoaguChek XS/INR Waived, Hemoglobin, fingerstick  Chalazion of right upper eyelid  Anxiety and depression  INR is therapeutic at 2.2.  Continue current regimen.  May follow-up in 6 to 8 weeks  Lesion of right eyelid appears to be consistent with a chalazion.  No evidence of secondary bacterial infection.  Recommended warm compresses.  Her daughter will contact me should this get very large or painful and we can consider referral to ophthalmology for incision and drainage  With regards to her anxiety and depression, she has had some weight loss since her last visit.  Uncertain if this is reflective of her anxiety and depression or if she is starting to demonstrate failure to thrive given advanced age.  I have advised her to resume use of the mirtazapine 7.5 mg nightly.  We will reconvene again in the next 6 to 8 weeks for recheck of weight and mood   No orders of the defined types were placed in this encounter.  No orders of the defined types were placed in this encounter.    Janora Norlander, DO [place] [phone]

## 2021-12-29 ENCOUNTER — Emergency Department (HOSPITAL_COMMUNITY)
Admission: EM | Admit: 2021-12-29 | Discharge: 2021-12-30 | Disposition: A | Discharge status: Home / Self Care | Payer: PPO | Attending: Emergency Medicine | Admitting: Emergency Medicine

## 2021-12-29 ENCOUNTER — Other Ambulatory Visit: Payer: Self-pay

## 2021-12-29 DIAGNOSIS — R0602 Shortness of breath: Secondary | ICD-10-CM | POA: Diagnosis not present

## 2021-12-29 DIAGNOSIS — Z7901 Long term (current) use of anticoagulants: Secondary | ICD-10-CM | POA: Insufficient documentation

## 2021-12-29 DIAGNOSIS — I4891 Unspecified atrial fibrillation: Secondary | ICD-10-CM | POA: Diagnosis present

## 2021-12-29 DIAGNOSIS — K5900 Constipation, unspecified: Secondary | ICD-10-CM

## 2021-12-30 ENCOUNTER — Emergency Department (HOSPITAL_COMMUNITY): Payer: PPO

## 2021-12-30 ENCOUNTER — Other Ambulatory Visit: Payer: Self-pay | Admitting: Family Medicine

## 2021-12-30 ENCOUNTER — Encounter (HOSPITAL_COMMUNITY): Payer: Self-pay | Admitting: Emergency Medicine

## 2021-12-30 ENCOUNTER — Other Ambulatory Visit: Payer: Self-pay

## 2021-12-30 DIAGNOSIS — E7800 Pure hypercholesterolemia, unspecified: Secondary | ICD-10-CM

## 2021-12-30 LAB — TROPONIN I (HIGH SENSITIVITY)
Troponin I (High Sensitivity): 10 ng/L (ref <18)
Troponin I (High Sensitivity): 10 ng/L (ref <18)

## 2021-12-30 LAB — BRAIN NATRIURETIC PEPTIDE: B Natriuretic Peptide: 151.4 pg/mL — ABNORMAL HIGH (ref 0.0–100.0)

## 2021-12-30 LAB — COMPREHENSIVE METABOLIC PANEL
ALT: 17 U/L (ref 0–44)
AST: 21 U/L (ref 15–41)
Albumin: 3.4 g/dL — ABNORMAL LOW (ref 3.5–5.0)
Alkaline Phosphatase: 86 U/L (ref 38–126)
Anion gap: 9 (ref 5–15)
BUN: 11 mg/dL (ref 8–23)
CO2: 27 mmol/L (ref 22–32)
Calcium: 9.2 mg/dL (ref 8.9–10.3)
Chloride: 94 mmol/L — ABNORMAL LOW (ref 98–111)
Creatinine, Ser: 0.71 mg/dL (ref 0.44–1.00)
GFR, Estimated: >60 mL/min (ref >60)
Glucose, Bld: 108 mg/dL — ABNORMAL HIGH (ref 70–99)
Potassium: 4.1 mmol/L (ref 3.5–5.1)
Sodium: 130 mmol/L — ABNORMAL LOW (ref 135–145)
Total Bilirubin: 0.7 mg/dL (ref 0.3–1.2)
Total Protein: 6.6 g/dL (ref 6.5–8.1)

## 2021-12-30 LAB — PROTIME-INR
INR: 1.8 — ABNORMAL HIGH (ref 0.8–1.2)
Prothrombin Time: 20.9 seconds — ABNORMAL HIGH (ref 11.4–15.2)

## 2021-12-30 LAB — CBC WITH DIFFERENTIAL/PLATELET
Abs Immature Granulocytes: 0.04 10*3/uL (ref 0.00–0.07)
Basophils Absolute: 0 10*3/uL (ref 0.0–0.1)
Basophils Relative: 1 %
Eosinophils Absolute: 0.3 10*3/uL (ref 0.0–0.5)
Eosinophils Relative: 5 %
HCT: 38.3 % (ref 36.0–46.0)
Hemoglobin: 12.9 g/dL (ref 12.0–15.0)
Immature Granulocytes: 1 %
Lymphocytes Relative: 21 %
Lymphs Abs: 1.5 10*3/uL (ref 0.7–4.0)
MCH: 31.9 pg (ref 26.0–34.0)
MCHC: 33.7 g/dL (ref 30.0–36.0)
MCV: 94.8 fL (ref 80.0–100.0)
Monocytes Absolute: 0.8 10*3/uL (ref 0.1–1.0)
Monocytes Relative: 11 %
Neutro Abs: 4.4 10*3/uL (ref 1.7–7.7)
Neutrophils Relative %: 61 %
Platelets: 221 10*3/uL (ref 150–400)
RBC: 4.04 MIL/uL (ref 3.87–5.11)
RDW: 12.9 % (ref 11.5–15.5)
WBC: 7.1 10*3/uL (ref 4.0–10.5)
nRBC: 0 % (ref 0.0–0.2)

## 2021-12-30 MED ORDER — DOCUSATE SODIUM 100 MG PO CAPS
100.0000 mg | ORAL_CAPSULE | Freq: Once | ORAL | Status: AC
Start: 2021-12-30 — End: 2021-12-30
  Administered 2021-12-30: 100 mg via ORAL
  Filled 2021-12-30: qty 1

## 2021-12-30 MED ORDER — ONDANSETRON 4 MG PO TBDP
8.0000 mg | ORAL_TABLET | Freq: Once | ORAL | Status: AC
  Administered 2021-12-30: 8 mg via ORAL
  Filled 2021-12-30: qty 2

## 2021-12-30 MED ORDER — SENNOSIDES-DOCUSATE SODIUM 8.6-50 MG PO TABS
1.0000 | ORAL_TABLET | ORAL | Status: AC
  Administered 2021-12-30: 1 via ORAL
  Filled 2021-12-30: qty 1

## 2021-12-30 MED ORDER — WARFARIN SODIUM 6 MG PO TABS
6.0000 mg | ORAL_TABLET | Freq: Once | ORAL | Status: AC
  Administered 2021-12-30: 6 mg via ORAL
  Filled 2021-12-30: qty 1

## 2021-12-30 NOTE — ED Provider Triage Note (Signed)
Emergency Medicine Provider Triage Evaluation Note  [name] , a 88 y.o. female  was evaluated in triage.  Pt complains of AFIB x 2 days.  Has been going in and out for the past 2 days, more persistent today.  Some intermittent chest pain, SOB, dizziness.  She is on coumadin. Followed by cards, Dr. Ellyn Hack.  Review of Systems  Positive: AFIB, chest pain Negative: fever  Physical Exam  BP (!) 158/57 (BP Location: Left Arm)    Pulse 62    Temp 97.7 F (36.5 C) (Oral)    Resp 18    SpO2 97%   Gen:   Awake, no distress   Resp:  Normal effort  MSK:   Moves extremities without difficulty  Other:    Medical Decision Making  Medically screening exam initiated at 12:06 AM.  Appropriate orders placed.  [name] was informed that the remainder of the evaluation will be completed by another provider, this initial triage assessment does not replace that evaluation, and the importance of remaining in the ED until their evaluation is complete.  AFIB x 2 days, intermittent.  EKG with same but rate controlled at 93 in triage.  Labs, CXR.   Larene Pickett, PA-C 12/30/21 [phone]

## 2021-12-30 NOTE — ED Provider Notes (Signed)
[facility] EMERGENCY DEPARTMENT Provider Note   CSN: [id number] Arrival date & time: 12/29/21  2334     History  Chief Complaint  Patient presents with   Atrial Fibrillation    [name] is a 88 y.o. female.  The history is provided by the patient and a relative.  Atrial Fibrillation This is a chronic problem. The current episode started more than 2 days ago. The problem occurs constantly. The problem has not changed since onset.Pertinent negatives include no chest pain, no abdominal pain, no headaches and no shortness of breath. Nothing aggravates the symptoms. Nothing relieves the symptoms. Treatments tried: home coumadin. The treatment provided no relief.  Patient is here with atrial fibrillation on COumadin with increased fatigue and also constipation.  No CP, no SOB, no abdominal or back pain.      Home Medications Prior to Admission medications   Medication Sig Start Date End Date Taking? Authorizing Provider  acetaminophen (TYLENOL) 500 MG tablet Take 500 mg by mouth every 6 (six) hours as needed for mild pain or moderate pain.   Yes [provider]  atorvastatin (LIPITOR) 10 MG tablet TAKE 1 TABLET BY MOUTH EVERY DAY Patient taking differently: Take 10 mg by mouth daily. 10/02/21  Yes Gottschalk, Leatrice Jewels M, DO  baclofen (LIORESAL) 10 MG tablet Take 1 tablet (10 mg total) by mouth 3 (three) times daily. 10/26/21  Yes Gwenlyn Perking, FNP  calcium carbonate (OS-CAL - DOSED IN MG OF ELEMENTAL CALCIUM) 1250 MG tablet Take 1 tablet by mouth every morning.    Yes [provider]  cholecalciferol (VITAMIN D) 1000 UNITS tablet Take 1,000 Units by mouth daily.   Yes [provider]  DILT-XR 240 MG 24 hr capsule TAKE 1 CAPSULE BY MOUTH EVERY MORNING ON AN EMPTY STOMACH Patient taking differently: Take 240 mg by mouth daily. 11/16/21  Yes Gottschalk, Ashly M, DO  furosemide (LASIX) 20 MG tablet TAKE ONE TO TWO TABLETS EVERY DAY AS  INSTRUCTED Patient taking differently: Take 20 mg by mouth daily. **take one to two tablets everyday as instructed** 05/19/21  Yes Gottschalk, Ashly M, DO  loratadine (CLARITIN) 10 MG tablet TAKE 1 TABLET (10 MG TOTAL) BY MOUTH DAILY AS NEEDED FOR ALLERGIES OR ITCHING. 11/16/21  Yes Gottschalk, Ashly M, DO  mirtazapine (REMERON) 7.5 MG tablet TAKE 1 TABLET BY MOUTH EVERYDAY AT BEDTIME Patient taking differently: Take 7.5 mg by mouth at bedtime. 11/16/21  Yes Gottschalk, Leatrice Jewels M, DO  Multiple Vitamins-Minerals (PRESERVISION AREDS 2+MULTI VIT PO) Take by mouth.   Yes [provider]  olopatadine (PATANOL) 0.1 % ophthalmic solution PLACE 1 DROP IN AFFECTED EYE(S) TWICE DAILY Patient taking differently: Place 1 drop into both eyes 2 (two) times daily. 06/26/21  Yes Gottschalk, Leatrice Jewels M, DO  Omega-3 Fatty Acids (FISH OIL) 1000 MG CAPS Take 1,000 mg by mouth daily.   Yes [provider]  omeprazole (PRILOSEC) 20 MG capsule TAKE 1 CAPSULE BY MOUTH EVERY DAY Patient taking differently: Take 20 mg by mouth daily. 11/16/21  Yes Gottschalk, Ashly M, DO  ondansetron (ZOFRAN-ODT) 4 MG disintegrating tablet Take 4 mg by mouth every 8 (eight) hours as needed. 10/28/21  Yes [provider]  Vibegron (GEMTESA) 75 MG TABS Take 1 tablet by mouth daily. 03/13/21  Yes Irine Seal, MD  warfarin (COUMADIN) 3 MG tablet TAKE 1 TABLET BY MOUTH EVERY DAY EXCEPT TAKE 2 ON MONDAY, [place], [place], AND SATURDAY Patient taking differently: Take 3 mg by  mouth daily at 4 PM. **TAKE 1 TABLET BY MOUTH EVERY DAY EXCEPT TAKE 2 ON MONDAY, WEDNESDAY, FRIDAY, AND Saturday** 04/17/21  Yes Gottschalk, Ashly M, DO  Misc. Devices (TOILET SAFETY FRAME) MISC UAD 04/27/21   Gottschalk, Ashly M, DO  PROAIR HFA 108 (90 Base) MCG/ACT inhaler TAKE 2 PUFFS BY MOUTH EVERY 6 HOURS AS NEEDED FOR WHEEZE OR SHORTNESS OF BREATH Patient not taking: Reported on 12/30/2021 11/17/18   Janora Norlander, DO      Allergies    Contrast media  [iodinated contrast media], Amoxicillin, Ampicillin, Cefdinir, Cortisol [hydrocortisone], Ivp dye [iodinated contrast media], Levaquin [levofloxacin], Penicillins, and Vancomycin    Review of Systems   Review of Systems  Constitutional:  Negative for fever.  HENT:  Negative for facial swelling.   Eyes:  Negative for redness.  Respiratory:  Negative for shortness of breath.   Cardiovascular:  Negative for chest pain.  Gastrointestinal:  Positive for constipation. Negative for abdominal pain and vomiting.  Musculoskeletal:  Negative for back pain and neck pain.  Neurological:  Negative for weakness, numbness and headaches.  All other systems reviewed and are negative.  Physical Exam Updated Vital Signs BP (!) 144/70    Pulse 96    Temp 97.7 F (36.5 C) (Oral)    Resp 18    SpO2 96%  Physical Exam Vitals and nursing note reviewed.  Constitutional:      General: She is not in acute distress.    Appearance: Normal appearance. She is not diaphoretic.  HENT:     Head: Normocephalic and atraumatic.     Nose: Nose normal.  Eyes:     Conjunctiva/sclera: Conjunctivae normal.     Pupils: Pupils are equal, round, and reactive to light.  Cardiovascular:     Rate and Rhythm: Normal rate. Rhythm irregular.     Pulses: Normal pulses.     Heart sounds: Normal heart sounds.  Pulmonary:     Effort: Pulmonary effort is normal.     Breath sounds: Normal breath sounds.  Abdominal:     General: Abdomen is flat. Bowel sounds are normal.     Palpations: Abdomen is soft.     Tenderness: There is no abdominal tenderness. There is no guarding or rebound.  Musculoskeletal:        General: No tenderness.     Cervical back: Normal range of motion and neck supple.     Right lower leg: No edema.     Left lower leg: No edema.  Skin:    General: Skin is warm and dry.  Neurological:     General: No focal deficit present.     Mental Status: She is alert and oriented to person, place, and time.     Deep  Tendon Reflexes: Reflexes normal.  Psychiatric:        Mood and Affect: Mood normal.        Behavior: Behavior normal.    ED Results / Procedures / Treatments   Labs (all labs ordered are listed, but only abnormal results are displayed) Results for orders placed or performed during the hospital encounter of 12/29/21  CBC with Differential  Result Value Ref Range   WBC 7.1 4.0 - 10.5 K/uL   RBC 4.04 3.87 - 5.11 MIL/uL   Hemoglobin 12.9 12.0 - 15.0 g/dL   HCT 38.3 36.0 - 46.0 %   MCV 94.8 80.0 - 100.0 fL   MCH 31.9 26.0 - 34.0 pg   MCHC 33.7 30.0 -  36.0 g/dL   RDW 12.9 11.5 - 15.5 %   Platelets 221 150 - 400 K/uL   nRBC 0.0 0.0 - 0.2 %   Neutrophils Relative % 61 %   Neutro Abs 4.4 1.7 - 7.7 K/uL   Lymphocytes Relative 21 %   Lymphs Abs 1.5 0.7 - 4.0 K/uL   Monocytes Relative 11 %   Monocytes Absolute 0.8 0.1 - 1.0 K/uL   Eosinophils Relative 5 %   Eosinophils Absolute 0.3 0.0 - 0.5 K/uL   Basophils Relative 1 %   Basophils Absolute 0.0 0.0 - 0.1 K/uL   Immature Granulocytes 1 %   Abs Immature Granulocytes 0.04 0.00 - 0.07 K/uL  Comprehensive metabolic panel  Result Value Ref Range   Sodium 130 (L) 135 - 145 mmol/L   Potassium 4.1 3.5 - 5.1 mmol/L   Chloride 94 (L) 98 - 111 mmol/L   CO2 27 22 - 32 mmol/L   Glucose, Bld 108 (H) 70 - 99 mg/dL   BUN 11 8 - 23 mg/dL   Creatinine, Ser 0.71 0.44 - 1.00 mg/dL   Calcium 9.2 8.9 - 10.3 mg/dL   Total Protein 6.6 6.5 - 8.1 g/dL   Albumin 3.4 (L) 3.5 - 5.0 g/dL   AST 21 15 - 41 U/L   ALT 17 0 - 44 U/L   Alkaline Phosphatase 86 38 - 126 U/L   Total Bilirubin 0.7 0.3 - 1.2 mg/dL   GFR, Estimated >60 >60 mL/min   Anion gap 9 5 - 15  Protime-INR  Result Value Ref Range   Prothrombin Time 20.9 (H) 11.4 - 15.2 seconds   INR 1.8 (H) 0.8 - 1.2  Brain natriuretic peptide  Result Value Ref Range   B Natriuretic Peptide 151.4 (H) 0.0 - 100.0 pg/mL  Troponin I (High Sensitivity)  Result Value Ref Range   Troponin I (High  Sensitivity) 10 <18 ng/L   DG Chest 2 View  Result Date: 12/30/2021 CLINICAL DATA:  Chest pain. EXAM: CHEST - 2 VIEW COMPARISON:  Chest radiograph dated 11/09/2018 FINDINGS: There is diffuse chronic intra coarsening and bronchitic changes. No focal consolidation, pleural effusion, pneumothorax. Numerous atelectasis. Stable cardiomediastinal silhouette. Atherosclerotic calcification of the aorta. No acute osseous pathology. Osteopenia with degenerative changes of the spine. IMPRESSION: No active cardiopulmonary disease. Electronically Signed   By: Anner Crete M.D.   On: 12/30/2021 01:14    EKG See muse, AFIB rate 93     Radiology DG Chest 2 View  Result Date: 12/30/2021 CLINICAL DATA:  Chest pain. EXAM: CHEST - 2 VIEW COMPARISON:  Chest radiograph dated 11/09/2018 FINDINGS: There is diffuse chronic intra coarsening and bronchitic changes. No focal consolidation, pleural effusion, pneumothorax. Numerous atelectasis. Stable cardiomediastinal silhouette. Atherosclerotic calcification of the aorta. No acute osseous pathology. Osteopenia with degenerative changes of the spine. IMPRESSION: No active cardiopulmonary disease. Electronically Signed   By: Anner Crete M.D.   On: 12/30/2021 01:14    Procedures Procedures    Medications Ordered in ED Medications  warfarin (COUMADIN) tablet 6 mg (has no administration in time range)    ED Course/ Medical Decision Making/ A&P                           Medical Decision Making Amount and/or Complexity of Data Reviewed Independent Historian:     Details: daughter who is concered about the AFIB but did not call cardiology External Data Reviewed: labs. Labs: ordered. Decision-making details  documented in ED Course.    Details: INR subtherapeutic, increased coumading dose given in the ED Radiology: ordered. Decision-making details documented in ED Course.    Details: NACPD by me on CXR ECG/medicine tests: ordered and independent  interpretation performed. Discussion of management or test interpretation with external provider(s): 248 case d/w Dr. Kalman Shan of cardiology.  Does not require admission.  No indication for further testing or treatment at this time.  Call for clinic follow up    Risk Prescription drug management.   Augmented coumadin dose given in the ED due to subtherapeutic INR MDM Reviewed: previous chart, nursing note and vitals Reviewed previous: labs Interpretation: labs, ECG and x-ray Consults: cardiology   Ruled out for MI in the ED.  I do not believe this is  PE nor do I suspect dissection at this time.  Blood pressure is normal and rate is controlled.  I do not believe the patient requires cardioversion nor admission at this time.  Call cardiology in the am for follow up.  Will increase miralax to BID.    Strict return precautions given,   [name] was evaluated in Emergency Department on 12/30/2021 for the symptoms described in the history of present illness. She was evaluated in the context of the global COVID-19 pandemic, which necessitated consideration that the patient might be at risk for infection with the SARS-CoV-2 virus that causes COVID-19. Institutional protocols and algorithms that pertain to the evaluation of patients at risk for COVID-19 are in a state of rapid change based on information released by regulatory bodies including the CDC and federal and state organizations. These policies and algorithms were followed during the patient's care in the ED.  Final Clinical Impression(s) / ED Diagnoses Final diagnoses:  None   Return for intractable cough, coughing up blood, fevers > 100.4 unrelieved by medication, shortness of breath, intractable vomiting, chest pain, shortness of breath, weakness, numbness, changes in speech, facial asymmetry, abdominal pain, passing out, Inability to tolerate liquids or food, cough, altered mental status or any concerns. No signs of systemic illness  or infection. The patient is nontoxic-appearing on exam and vital signs are within normal limits.  I have reviewed the triage vital signs and the nursing notes. Pertinent labs & imaging results that were available during my care of the patient were reviewed by me and considered in my medical decision making (see chart for details). After history, exam, and medical workup I feel the patient has been appropriately medically screened and is safe for discharge home. Pertinent diagnoses were discussed with the patient. Patient was given return precautions.      Rx / DC Orders ED Discharge Orders     None         , , MD 12/30/21 4712

## 2021-12-30 NOTE — Discharge Instructions (Signed)
Increase miralax to twice daily

## 2021-12-30 NOTE — ED Triage Notes (Signed)
Pt brought to ED by daughter with c/o intermittent runs of atrial fibrillation x2 days with occasional SOB, palpitations and nausea. Daughter reports history of aortic aneurysm.

## 2021-12-30 NOTE — ED Notes (Signed)
Pt had some dizziness when standing for the orthostatic vital signs. Pt was assisted to a standing position unable to stand for the 3 minutes. Nurse and MD are aware.

## 2022-01-01 ENCOUNTER — Telehealth: Payer: Self-pay | Admitting: Family Medicine

## 2022-01-01 NOTE — Telephone Encounter (Signed)
Left message for patient to call back and schedule Medicare Annual Wellness Visit (AWV) to be completed by video or phone.   Last AWV: 05/11/2019  Please schedule at anytime with [place]  45 minute appointment  Any questions, please contact me at [phone]

## 2022-01-04 ENCOUNTER — Other Ambulatory Visit: Payer: Self-pay | Admitting: Family Medicine

## 2022-01-04 ENCOUNTER — Telehealth: Payer: Self-pay | Admitting: *Deleted

## 2022-01-04 NOTE — Telephone Encounter (Signed)
TC from Katie PT w/ [facility] Pt c/o light headedness & had irregular HR today during PT. She did some walking and standing exercises. HR before PT 76 after was 80. BP 126/70. No c/o chest pain, some right hip pain and stomach issues.

## 2022-01-18 ENCOUNTER — Other Ambulatory Visit: Payer: Self-pay | Admitting: Family Medicine

## 2022-01-19 ENCOUNTER — Telehealth: Payer: Self-pay | Admitting: Cardiology

## 2022-01-19 NOTE — Telephone Encounter (Signed)
[place] called to let Dr. Ellyn Hack and nurse know that she is constantly staying in afib. Wants to know if patient needs to be seen sooner. Please call Kim at [phone].

## 2022-01-19 NOTE — Telephone Encounter (Signed)
Spoke with [name], aware patient has permanent atrial fib.

## 2022-01-26 ENCOUNTER — Telehealth: Payer: Self-pay | Admitting: Cardiology

## 2022-01-26 NOTE — Telephone Encounter (Signed)
Pt is wanting to know if she is able to start getting her INR checked at home by home health nurse...Marland Kitchen please advise

## 2022-01-26 NOTE — Telephone Encounter (Signed)
Spoke with daughter Neoma Laming who requested INRs be drawn by home health nurse because it is difficult for the patient to leave the house. [place] [phone]) who needs an order. Fax # [phone]

## 2022-01-27 ENCOUNTER — Telehealth: Payer: Self-pay

## 2022-01-27 ENCOUNTER — Other Ambulatory Visit: Payer: Self-pay

## 2022-01-27 DIAGNOSIS — I4821 Permanent atrial fibrillation: Secondary | ICD-10-CM

## 2022-01-27 NOTE — Telephone Encounter (Signed)
I spoke to pt's daughter, [facility] and PCP to arrange INR checked and now reported to [place].  They all verbalized understanding and will f/u.

## 2022-01-27 NOTE — Telephone Encounter (Signed)
HH given orders

## 2022-01-28 ENCOUNTER — Other Ambulatory Visit: Payer: Self-pay | Admitting: Family Medicine

## 2022-01-29 ENCOUNTER — Encounter (HOSPITAL_COMMUNITY): Payer: Self-pay | Admitting: Emergency Medicine

## 2022-01-29 ENCOUNTER — Emergency Department (HOSPITAL_COMMUNITY): Payer: PPO

## 2022-01-29 ENCOUNTER — Other Ambulatory Visit: Payer: Self-pay

## 2022-01-29 ENCOUNTER — Telehealth: Payer: Self-pay | Admitting: Cardiology

## 2022-01-29 ENCOUNTER — Emergency Department (HOSPITAL_COMMUNITY)
Admission: EM | Admit: 2022-01-29 | Discharge: 2022-01-29 | Disposition: A | Discharge status: Home / Self Care | Payer: PPO | Attending: Emergency Medicine | Admitting: Emergency Medicine

## 2022-01-29 DIAGNOSIS — Z7901 Long term (current) use of anticoagulants: Secondary | ICD-10-CM | POA: Insufficient documentation

## 2022-01-29 DIAGNOSIS — Z20822 Contact with and (suspected) exposure to covid-19: Secondary | ICD-10-CM | POA: Diagnosis not present

## 2022-01-29 DIAGNOSIS — R197 Diarrhea, unspecified: Secondary | ICD-10-CM | POA: Diagnosis present

## 2022-01-29 DIAGNOSIS — K5730 Diverticulosis of large intestine without perforation or abscess without bleeding: Secondary | ICD-10-CM | POA: Diagnosis not present

## 2022-01-29 LAB — CBC WITH DIFFERENTIAL/PLATELET
Abs Immature Granulocytes: 0.02 10*3/uL (ref 0.00–0.07)
Basophils Absolute: 0.1 10*3/uL (ref 0.0–0.1)
Basophils Relative: 1 %
Eosinophils Absolute: 0.2 10*3/uL (ref 0.0–0.5)
Eosinophils Relative: 5 %
HCT: 37.9 % (ref 36.0–46.0)
Hemoglobin: 13 g/dL (ref 12.0–15.0)
Immature Granulocytes: 0 %
Lymphocytes Relative: 21 %
Lymphs Abs: 1.1 10*3/uL (ref 0.7–4.0)
MCH: 32.7 pg (ref 26.0–34.0)
MCHC: 34.3 g/dL (ref 30.0–36.0)
MCV: 95.5 fL (ref 80.0–100.0)
Monocytes Absolute: 0.7 10*3/uL (ref 0.1–1.0)
Monocytes Relative: 13 %
Neutro Abs: 3 10*3/uL (ref 1.7–7.7)
Neutrophils Relative %: 60 %
Platelets: 210 10*3/uL (ref 150–400)
RBC: 3.97 MIL/uL (ref 3.87–5.11)
RDW: 13.4 % (ref 11.5–15.5)
WBC: 5.1 10*3/uL (ref 4.0–10.5)
nRBC: 0 % (ref 0.0–0.2)

## 2022-01-29 LAB — COMPREHENSIVE METABOLIC PANEL
ALT: 18 U/L (ref 0–44)
AST: 20 U/L (ref 15–41)
Albumin: 3.4 g/dL — ABNORMAL LOW (ref 3.5–5.0)
Alkaline Phosphatase: 86 U/L (ref 38–126)
Anion gap: 8 (ref 5–15)
BUN: 16 mg/dL (ref 8–23)
CO2: 21 mmol/L — ABNORMAL LOW (ref 22–32)
Calcium: 8.7 mg/dL — ABNORMAL LOW (ref 8.9–10.3)
Chloride: 102 mmol/L (ref 98–111)
Creatinine, Ser: 0.53 mg/dL (ref 0.44–1.00)
GFR, Estimated: >60 mL/min (ref >60)
Glucose, Bld: 91 mg/dL (ref 70–99)
Potassium: 3.8 mmol/L (ref 3.5–5.1)
Sodium: 131 mmol/L — ABNORMAL LOW (ref 135–145)
Total Bilirubin: 0.6 mg/dL (ref 0.3–1.2)
Total Protein: 6.5 g/dL (ref 6.5–8.1)

## 2022-01-29 LAB — RESP PANEL BY RT-PCR (FLU A&B, COVID) ARPGX2
Influenza A by PCR: NEGATIVE
Influenza B by PCR: NEGATIVE
SARS Coronavirus 2 by RT PCR: NEGATIVE

## 2022-01-29 MED ORDER — LOPERAMIDE HCL 2 MG PO CAPS
4.0000 mg | ORAL_CAPSULE | Freq: Once | ORAL | Status: AC
Start: 2022-01-29 — End: 2022-01-29
  Administered 2022-01-29: 4 mg via ORAL
  Filled 2022-01-29: qty 2

## 2022-01-29 MED ORDER — SODIUM CHLORIDE 0.9 % IV BOLUS
500.0000 mL | Freq: Once | INTRAVENOUS | Status: AC
  Administered 2022-01-29: 500 mL via INTRAVENOUS

## 2022-01-29 NOTE — Telephone Encounter (Signed)
[facility] team at [facility] has been made aware.

## 2022-01-29 NOTE — Discharge Instructions (Signed)
Call your primary care doctor as discussed in the next 2-3 days.    Take Imodium daily as needed for continued diarrhea.  Return immediately back to the ER if:  Your symptoms worsen within the next 12-24 hours. You develop new symptoms such as new fevers, persistent vomiting, new pain, shortness of breath, or new weakness or numbness, or if you have any other concerns.

## 2022-01-29 NOTE — Telephone Encounter (Signed)
Message sent to Dr. Ellyn Hack for a fyi.

## 2022-01-29 NOTE — Telephone Encounter (Signed)
Patient's daughter just wants to make Dr. Ellyn Hack aware that her mom has been escorted to [facility] by ambulance for her afib.

## 2022-01-29 NOTE — ED Triage Notes (Signed)
Pt arrived via [facility] c/o diarrhea x 4 days. Per EMS pt has a Hx of A.fib.

## 2022-01-29 NOTE — Telephone Encounter (Signed)
We should probably send a message up to the [place] office to make sure that our covering team there knows that she is going.  Glenetta Hew, MD

## 2022-01-29 NOTE — ED Provider Notes (Signed)
[place] Provider Note   CSN: [id number] Arrival date & time: 01/29/22  1112     History  No chief complaint on file.   [name] is a 88 y.o. female.  Presented chief complaint of diarrhea ongoing for 3 to 4 days.  Describes it about 4 episodes a day.  No vomiting.  Denies any recent travel or recent antibiotic use denies fevers.  Intermittent abdominal cramping pain.      Home Medications Prior to Admission medications   Medication Sig Start Date End Date Taking? Authorizing Provider  acetaminophen (TYLENOL) 500 MG tablet Take 500 mg by mouth every 6 (six) hours as needed for mild pain or moderate pain.   Yes [provider]  atorvastatin (LIPITOR) 10 MG tablet TAKE 1 TABLET BY MOUTH EVERY DAY 12/30/21  Yes Gottschalk, Ashly M, DO  baclofen (LIORESAL) 10 MG tablet Take 1 tablet (10 mg total) by mouth 3 (three) times daily. 10/26/21  Yes Gwenlyn Perking, FNP  calcium carbonate (OS-CAL - DOSED IN MG OF ELEMENTAL CALCIUM) 1250 MG tablet Take 1 tablet by mouth every morning.    Yes [provider]  cholecalciferol (VITAMIN D) 1000 UNITS tablet Take 1,000 Units by mouth daily.   Yes [provider]  DILT-XR 240 MG 24 hr capsule TAKE 1 CAPSULE BY MOUTH EVERY MORNING ON AN EMPTY STOMACH 01/28/22  Yes Gottschalk, Ashly M, DO  furosemide (LASIX) 20 MG tablet TAKE ONE TO TWO TABLETS EVERY DAY AS INSTRUCTED Patient taking differently: Take 20 mg by mouth daily. **take one to two tablets everyday as instructed** 05/19/21  Yes Gottschalk, Ashly M, DO  loratadine (CLARITIN) 10 MG tablet TAKE 1 TABLET (10 MG TOTAL) BY MOUTH DAILY AS NEEDED FOR ALLERGIES OR ITCHING. 11/16/21  Yes Gottschalk, Ashly M, DO  Misc. Devices (TOILET SAFETY FRAME) MISC UAD 04/27/21  Yes Gottschalk, Leatrice Jewels M, DO  Multiple Vitamins-Minerals (PRESERVISION AREDS 2+MULTI VIT PO) Take by mouth.   Yes [provider]  olopatadine (PATANOL) 0.1 % ophthalmic  solution PLACE 1 DROP IN AFFECTED EYE(S) TWICE DAILY Patient taking differently: Place 1 drop into both eyes 2 (two) times daily. 06/26/21  Yes Gottschalk, Leatrice Jewels M, DO  Omega-3 Fatty Acids (FISH OIL) 1000 MG CAPS Take 1,000 mg by mouth daily.   Yes [provider]  omeprazole (PRILOSEC) 20 MG capsule TAKE 1 CAPSULE BY MOUTH EVERY DAY 01/28/22  Yes Gottschalk, Ashly M, DO  ondansetron (ZOFRAN-ODT) 4 MG disintegrating tablet TAKE 1 TABLET BY MOUTH EVERY 8 HOURS AS NEEDED FOR NAUSEA 01/18/22  Yes Gottschalk, Ashly M, DO  Vibegron (GEMTESA) 75 MG TABS Take 1 tablet by mouth daily. 03/13/21  Yes Irine Seal, MD  warfarin (COUMADIN) 3 MG tablet TAKE 1 TABLET BY MOUTH EVERY DAY EXCEPT TAKE 2 ON MONDAY, WEDNESDAY, FRIDAY, AND SATURDAY 01/04/22  Yes Gottschalk, Ashly M, DO  mirtazapine (REMERON) 7.5 MG tablet TAKE 1 TABLET BY MOUTH EVERYDAY AT BEDTIME Patient not taking: Reported on 01/29/2022 11/16/21   Janora Norlander, DO  PROAIR HFA 108 (90 Base) MCG/ACT inhaler TAKE 2 PUFFS BY MOUTH EVERY 6 HOURS AS NEEDED FOR WHEEZE OR SHORTNESS OF BREATH Patient not taking: Reported on 12/30/2021 11/17/18   Janora Norlander, DO      Allergies    Contrast media [iodinated contrast media], Amoxicillin, Ampicillin, Cefdinir, Cortisol [hydrocortisone], Ivp dye [iodinated contrast media], Levaquin [levofloxacin], Penicillins, and Vancomycin    Review of Systems   Review of Systems  Constitutional:  Negative for fever.  HENT:  Negative for ear pain.   Eyes:  Negative for pain.  Respiratory:  Negative for cough.   Cardiovascular:  Negative for chest pain.  Gastrointestinal:  Positive for abdominal pain.  Genitourinary:  Negative for flank pain.  Musculoskeletal:  Negative for back pain.  Skin:  Negative for rash.  Neurological:  Negative for headaches.   Physical Exam Updated Vital Signs BP (!) 148/94    Pulse 98    Temp 98.1 F (36.7 C) (Oral)    Resp 19    Ht 5\' 5"  (1.651 m)    Wt 65.8 kg    SpO2 95%     BMI 24.13 kg/m  Physical Exam Constitutional:      General: She is not in acute distress.    Appearance: Normal appearance.  HENT:     Head: Normocephalic.     Nose: Nose normal.  Eyes:     Extraocular Movements: Extraocular movements intact.  Cardiovascular:     Rate and Rhythm: Normal rate.  Pulmonary:     Effort: Pulmonary effort is normal.  Abdominal:     Comments: Mild diffuse tenderness no guarding or rebound.  Musculoskeletal:        General: Normal range of motion.     Cervical back: Normal range of motion.  Neurological:     General: No focal deficit present.     Mental Status: She is alert. Mental status is at baseline.    ED Results / Procedures / Treatments   Labs (all labs ordered are listed, but only abnormal results are displayed) Labs Reviewed  COMPREHENSIVE METABOLIC PANEL - Abnormal; Notable for the following components:      Result Value   Sodium 131 (*)    CO2 21 (*)    Calcium 8.7 (*)    Albumin 3.4 (*)    All other components within normal limits  RESP PANEL BY RT-PCR (FLU A&B, COVID) ARPGX2  CBC WITH DIFFERENTIAL/PLATELET    EKG None  Radiology CT Abdomen Pelvis Wo Contrast  Result Date: 01/29/2022 CLINICAL DATA:  Abdominal pain acute, nonlocalized, diarrhea. Diffuse tenderness and diarrhea for 4 days. EXAM: CT ABDOMEN AND PELVIS WITHOUT CONTRAST TECHNIQUE: Multidetector CT imaging of the abdomen and pelvis was performed following the standard protocol without IV contrast. RADIATION DOSE REDUCTION: This exam was performed according to the departmental dose-optimization program which includes automated exposure control, adjustment of the mA and/or kV according to patient size and/or use of iterative reconstruction technique. COMPARISON:  CT examination dated December 18, 2019 FINDINGS: Lower chest: Fibrotic changes in the bilateral lung bases with subsegmental atelectasis. Coronary artery atherosclerotic calcifications. Hepatobiliary: No focal liver  abnormality is seen. No gallstones, gallbladder wall thickening, or biliary dilatation. Pancreas: Unremarkable. No pancreatic ductal dilatation or surrounding inflammatory changes. Spleen: Normal in size without focal abnormality. Adrenals/Urinary Tract: Adrenal glands are unremarkable. Large left renal cyst measuring up to 6.6 cm, unchanged. No evidence of nephrolithiasis or hydronephrosis. Bladder is unremarkable. Stomach/Bowel: Stomach is within normal limits. Appendix appears normal. Colonic wall thickening with mucosal enhancement in the ascending and proximal transverse colon concerning for colitis. Colonic diverticulosis prominent in the sigmoid colon without evidence of acute diverticulitis. Vascular/Lymphatic: Advanced atherosclerotic disease of abdominal aorta. Aneurysmal dilatation of the infrarenal abdominal aorta measuring up to 2.9 cm. No enlarged abdominal or pelvic lymph nodes. Reproductive: Status post hysterectomy. No adnexal masses. Other: No abdominal wall hernia or abnormality. No abdominopelvic ascites. Musculoskeletal: S shaped kyphoscoliosis of the thoracolumbar  spine with advanced multilevel degenerate disc disease. Advanced right hip osteoarthritis with bone-on-bone articulation and small reactive joint effusion. Diffuse osteopenia. IMPRESSION: 1. Mild colonic wall thickening with mucosal enhancement of the ascending and transverse colon concerning for colitis, likely infectious process. 2. Marked colonic diverticulosis without evidence of acute diverticulitis. Normal appendix. 3. Stable aneurysmal dilatation of the abdominal aorta measuring up to 3 cm with advanced atherosclerotic disease. Follow-up examination with annual ultrasound is recommended. 4. Advanced degenerate disc disease of the thoracolumbar spine with S shaped scoliosis. Advanced right hip osteoarthritis with small joint effusion. Diffuse osteopenia. No appreciable acute fracture or subluxation. 5.  Additional chronic  findings as above. Electronically Signed   By: Keane Police D.O.   On: 01/29/2022 14:49    Procedures Procedures    Medications Ordered in ED Medications  loperamide (IMODIUM) capsule 4 mg (4 mg Oral Given 01/29/22 1213)  sodium chloride 0.9 % bolus 500 mL (500 mLs Intravenous Bolus 01/29/22 1217)    ED Course/ Medical Decision Making/ A&P                           Medical Decision Making Amount and/or Complexity of Data Reviewed Labs: ordered. Radiology: ordered.  Risk Prescription drug management.   History obtained from daughter as well who is at bedside.  Review of records shows patient office visit December 25, 2021 for atrial fibrillation.  Patient placed on the monitor with no additional adverse events she does have atrial fibrillation heart rate about 110 bpm.  Labs are sent white count chemistry normal creatinine is normal.  No evidence of severe dehydration noted.  Patient given 500 cc bolus of fluids and Imodium.  Subsequent CT abdomen pelvis shows incidental finding of aortic enlargement and colitis.  Diffuse diverticulosis noted as well.  Patient given Imodium and advised outpatient follow-up with her doctor within 3 to 4 days.  Advised immediate return if she cannot keep down any fluids has worsening symptoms or any additional concerns.  Patient expressed understanding, discharged home in stable condition all questions were answered.        Final Clinical Impression(s) / ED Diagnoses Final diagnoses:  Diarrhea, unspecified type    Rx / DC Orders ED Discharge Orders     None         Luna Fuse, MD 01/29/22 1504

## 2022-02-03 ENCOUNTER — Ambulatory Visit (INDEPENDENT_AMBULATORY_CARE_PROVIDER_SITE_OTHER): Payer: PPO | Admitting: Family Medicine

## 2022-02-03 ENCOUNTER — Encounter: Payer: Self-pay | Admitting: Family Medicine

## 2022-02-03 VITALS — BP 141/68 | HR 86 | Temp 97.1°F | Ht 65.0 in | Wt 146.0 lb

## 2022-02-03 DIAGNOSIS — K529 Noninfective gastroenteritis and colitis, unspecified: Secondary | ICD-10-CM | POA: Diagnosis not present

## 2022-02-03 DIAGNOSIS — E871 Hypo-osmolality and hyponatremia: Secondary | ICD-10-CM | POA: Diagnosis not present

## 2022-02-03 DIAGNOSIS — I482 Chronic atrial fibrillation, unspecified: Secondary | ICD-10-CM | POA: Diagnosis not present

## 2022-02-03 DIAGNOSIS — Z7901 Long term (current) use of anticoagulants: Secondary | ICD-10-CM

## 2022-02-03 LAB — COAGUCHEK XS/INR WAIVED
INR: 2.2 — ABNORMAL HIGH (ref 0.9–1.1)
Prothrombin Time: 26 s

## 2022-02-03 LAB — POCT INR: INR: 2.2 (ref 2.0–3.0)

## 2022-02-03 NOTE — Progress Notes (Addendum)
Subjective:  Patient ID: [name], female    DOB: [birth year], 88 y.o.   MRN: [id number]  Patient Care Team: Janora Norlander, DO as PCP - General (Family Medicine) Leonie Man, MD as PCP - Cardiology (Cardiology) Janora Norlander, DO (Family Medicine)   Chief Complaint:  ER follow up   HPI: [name] is a 88 y.o. female presenting on 02/03/2022 for ER follow up   Pt presents for an ED follow up accompanies by her daughter. She was seen at [facility] on 01/29/22 with a visit diagnosis of diarrhea and low BP reading of 80/40 at home. Her diarrhea has since resolved and she is no longer requiring imodium. Her daughter states she is staying in Afib all the time.   INR was noted to be 1.8 during ED visit, will repeat today.      Relevant past medical, surgical, family, and social history reviewed and updated as indicated.  Allergies and medications reviewed and updated. Data reviewed: Chart in Epic.   Past Medical History:  Diagnosis Date   AAA (abdominal aortic aneurysm)    Allergy    Anxiety    Bladder cancer ([place]) dx'd 2011   Chronic microscopic hematuria; transitional cell cancer   Blood transfusion 1960   "related to hysterectomy"   Chronic atrial fibrillation ([place])    Anticoagulated with warfarin, rate control with diltiazem and Toprol   Chronic back pain    Chronic headache    "probably 2/wk" (08/28/2015)   Concussion w/o coma 09/06/4627   Complicated by subarachnoid hemorrhage.  "even now has times when she's not able to comprehend" (05/08/12)   Coronary artery disease    Nonobstructive   Diverticulitis    Diverticulosis    DVT (deep venous thrombosis), right 2005   "right calf after 8 foot fall"   Edema of both legs    Chronic, thought to be secondary to DVTs   External hemorrhoids    Family history of adverse reaction to anesthesia 2012   daughter "had OR for crushed hand; had problems w/anesthesia & I was in there all day long" (    Fatty liver    Frequent UTI    GERD (gastroesophageal reflux disease)    H/O hiatal hernia    High cholesterol    Migraine    "rare now" (08/28/2015)   Pneumonia ~ 2010;  2013; 07/2015   Rheumatoid arthritis(714.0)    "hands" (08/28/2015)   Urinary frequency     Past Surgical History:  Procedure Laterality Date   BREAST CYST EXCISION Left 1960's?   2 cysts; benign   CARDIAC CATHETERIZATION  1980's   CATARACT EXTRACTION W/ INTRAOCULAR LENS  IMPLANT, BILATERAL Bilateral 1992   CYSTOSCOPY  11/11/2011   Procedure: CYSTOSCOPY;  Surgeon: Malka So;  Location: [facility];  Service: Urology;  Laterality: N/A;   CYSTOSTOMY W/ BLADDER BIOPSY  10/08/2013   INCONTINENCE SURGERY  1980's   JOINT REPLACEMENT     KNEE ARTHROSCOPY Right 2005   S/P fall   Lower Extremity Venous Doppler  11/10/2013   No DVT or superficial thrombus enlarged inguinal lymph node noted in the right. No Baker's cyst.   TONSILLECTOMY  1952   TOTAL KNEE ARTHROPLASTY Right 02/05/2013   Procedure: TOTAL KNEE ARTHROPLASTY;  Surgeon: Mauri Pole, MD;  Location: [facility];  Service: Orthopedics;  Laterality: Right;   TRANSTHORACIC ECHOCARDIOGRAM  01/'15; 4/'19   a) EF 60-65%. NO RWMA. Ao Sclerosis. MAC -  no MS with mild MR>  B Atriae mildly dilated;; b)  EF 55-60%. AoV Sclerosis (no stenosis).  Trivial AI & MR. Mild LAE & Severe RAE.. Mild RV dilation   TRANSURETHRAL RESECTION OF BLADDER TUMOR  11/11/2011   Procedure: TRANSURETHRAL RESECTION OF BLADDER TUMOR (TURBT);  Surgeon: Malka So;  Location: [facility];  Service: Urology;  Laterality: N/A;  Cysto, Bladder Biopsy, TURBT with Gyrus,    TRANSURETHRAL RESECTION OF BLADDER TUMOR WITH GYRUS (TURBT-GYRUS)  2007; 2009; 2010   "for tumors on surface of bladder"   TUMOR EXCISION  1976; 1980's   "fatty tumor cut off her upper back; left thumb"   VAGINAL HYSTERECTOMY  1960   partial     Social History   Socioeconomic History   Marital status: Widowed    Spouse name: Not on file    Number of children: 3   Years of education: Not on file   Highest education level: 8th grade  Occupational History   Occupation: Retired  Tobacco Use   Smoking status: Former    Packs/day: 1.00    Years: 40.00    Pack years: 40.00    Types: Cigarettes    Quit date: 11/07/1982    Years since quitting: 39.2   Smokeless tobacco: Former    Types: Snuff  Vaping Use   Vaping Use: Never used  Substance and Sexual Activity   Alcohol use: Yes    Alcohol/week: 7.0 standard drinks    Types: 7 Glasses of wine per week    Comment: 1 glass of wine prior to bed   Drug use: No   Sexual activity: Not Currently    Birth control/protection: Surgical  Other Topics Concern   Not on file  Social History Narrative   ** Merged History Encounter **       She is a widowed mother of 73, grandmother of 25, great grandmother of 18. She does not   really get routine exercise. She quit smoking in 1983. She does not smoke and does not use illicit drugs and does not drink.   Social Determinants of Health   Financial Resource Strain: Not on file  Food Insecurity: Not on file  Transportation Needs: Not on file  Physical Activity: Not on file  Stress: Not on file  Social Connections: Not on file  Intimate Partner Violence: Not on file    Outpatient Encounter Medications as of 02/03/2022  Medication Sig   acetaminophen (TYLENOL) 500 MG tablet Take 500 mg by mouth every 6 (six) hours as needed for mild pain or moderate pain.   atorvastatin (LIPITOR) 10 MG tablet TAKE 1 TABLET BY MOUTH EVERY DAY   baclofen (LIORESAL) 10 MG tablet Take 1 tablet (10 mg total) by mouth 3 (three) times daily.   calcium carbonate (OS-CAL - DOSED IN MG OF ELEMENTAL CALCIUM) 1250 MG tablet Take 1 tablet by mouth every morning.    cholecalciferol (VITAMIN D) 1000 UNITS tablet Take 1,000 Units by mouth daily.   DILT-XR 240 MG 24 hr capsule TAKE 1 CAPSULE BY MOUTH EVERY MORNING ON AN EMPTY STOMACH   furosemide (LASIX) 20 MG tablet  TAKE ONE TO TWO TABLETS EVERY DAY AS INSTRUCTED (Patient taking differently: Take 20 mg by mouth daily. **take one to two tablets everyday as instructed**)   loratadine (CLARITIN) 10 MG tablet TAKE 1 TABLET (10 MG TOTAL) BY MOUTH DAILY AS NEEDED FOR ALLERGIES OR ITCHING.   mirtazapine (REMERON) 7.5 MG tablet TAKE 1 TABLET BY MOUTH  EVERYDAY AT BEDTIME   Misc. Devices (TOILET SAFETY FRAME) MISC UAD   Multiple Vitamins-Minerals (PRESERVISION AREDS 2+MULTI VIT PO) Take by mouth.   olopatadine (PATANOL) 0.1 % ophthalmic solution PLACE 1 DROP IN AFFECTED EYE(S) TWICE DAILY (Patient taking differently: Place 1 drop into both eyes 2 (two) times daily.)   Omega-3 Fatty Acids (FISH OIL) 1000 MG CAPS Take 1,000 mg by mouth daily.   omeprazole (PRILOSEC) 20 MG capsule TAKE 1 CAPSULE BY MOUTH EVERY DAY   ondansetron (ZOFRAN-ODT) 4 MG disintegrating tablet TAKE 1 TABLET BY MOUTH EVERY 8 HOURS AS NEEDED FOR NAUSEA   PROAIR HFA 108 (90 Base) MCG/ACT inhaler TAKE 2 PUFFS BY MOUTH EVERY 6 HOURS AS NEEDED FOR WHEEZE OR SHORTNESS OF BREATH   Vibegron (GEMTESA) 75 MG TABS Take 1 tablet by mouth daily.   warfarin (COUMADIN) 3 MG tablet TAKE 1 TABLET BY MOUTH EVERY DAY EXCEPT TAKE 2 ON MONDAY, WEDNESDAY, FRIDAY, AND SATURDAY   No facility-administered encounter medications on file as of 02/03/2022.    Allergies  Allergen Reactions   Contrast Media [Iodinated Contrast Media] Hives   Amoxicillin Itching   Ampicillin Itching   Cefdinir Nausea And Vomiting   Cortisol [Hydrocortisone] Itching   Ivp Dye [Iodinated Contrast Media] Hives   Levaquin [Levofloxacin] Itching and Rash   Penicillins Rash    "haven't had it in years"   Vancomycin Itching and Rash    Review of Systems  Constitutional:  Positive for activity change. Negative for appetite change (Decreased hunger), chills, diaphoresis, fatigue, fever and unexpected weight change.  Respiratory:  Negative for cough and shortness of breath.   Cardiovascular:   Positive for leg swelling. Negative for chest pain.  Gastrointestinal:  Negative for abdominal distention, abdominal pain, anal bleeding, blood in stool, constipation, diarrhea, nausea, rectal pain and vomiting.  Musculoskeletal:  Positive for arthralgias.  Neurological:  Negative for dizziness, tremors, seizures, syncope, facial asymmetry, speech difficulty, weakness, light-headedness, numbness and headaches.  Hematological:  Does not bruise/bleed easily.  Psychiatric/Behavioral:  Negative for confusion.   All other systems reviewed and are negative.      Objective:  BP (!) 141/68    Pulse 86    Temp (!) 97.1 F (36.2 C) (Temporal)    Ht _0  (1.651 m)    Wt 66.2 kg    HC 94" (238.8 cm)    BMI 24.30 kg/m    Wt Readings from Last 3 Encounters:  02/03/22 66.2 kg  01/29/22 65.8 kg  12/25/21 64.9 kg    Physical Exam Vitals and nursing note reviewed.  Constitutional:      Appearance: Normal appearance. She is normal weight.  HENT:     Head: Normocephalic and atraumatic.  Eyes:     Pupils: Pupils are equal, round, and reactive to light.  Cardiovascular:     Rate and Rhythm: Normal rate. Rhythm irregularly irregular.     Heart sounds: Normal heart sounds. No murmur heard.   No friction rub. No gallop.  Pulmonary:     Effort: Pulmonary effort is normal.     Breath sounds: Normal breath sounds.  Abdominal:     General: Bowel sounds are normal. There is no distension.     Palpations: Abdomen is soft.     Tenderness: There is no abdominal tenderness.  Musculoskeletal:        General: Swelling present.     Cervical back: Normal range of motion.     Right lower leg: 1+ Edema present.  Left lower leg: 1+ Edema present.     Comments: Ambulates with walker  Skin:    General: Skin is warm and dry.     Capillary Refill: Capillary refill takes less than 2 seconds.  Neurological:     General: No focal deficit present.     Mental Status: She is alert and oriented to person, place,  and time. Mental status is at baseline.     Gait: Gait abnormal (using walker).  Psychiatric:        Mood and Affect: Mood normal.        Behavior: Behavior normal.        Thought Content: Thought content normal.        Judgment: Judgment normal.    Results for orders placed or performed in visit on 02/03/22  POCT INR  Result Value Ref Range   INR 2.2 2.0 - 3.0       Pertinent labs & imaging results that were available during my care of the patient were reviewed by me and considered in my medical decision making.  Assessment & Plan:  [name] was seen today for er follow up.  Diagnoses and all orders for this visit: [name] was seen today for er follow up.  Diagnoses and all orders for this visit:  Chronic atrial fibrillation (HCC) -     CoaguChek XS/INR Waived -     BMP8+EGFR -     CBC with Differential/Platelet - Last INR was 1.8, recheck in office today 2.2 at goal. No changes to anticoaguation made today. Will recheck INR at less visit.   Chronic anticoagulation -     BMP8+EGFR -     CBC with Differential/Platelet Colitis -     BMP8+EGFR -     CBC with Differential/Platelet - Resolved today. Will recheck BMP for assessment of electrolytes and hydration.  Hyponatremia -     BMP8+EGFR -     CBC with Differential/Platelet -Hyponatremia noted in results of ED visit. Will recheck today.         Continue all other maintenance medications.  Follow up plan: Keep scheduled appointment.    Continue healthy lifestyle choices, including diet (rich in fruits, vegetables, and lean proteins, and low in salt and simple carbohydrates) and exercise (at least 30 minutes of moderate physical activity daily).    The above assessment and management plan was discussed with the patient. The patient verbalized understanding of and has agreed to the management plan. Patient is aware to call the clinic if they develop any new symptoms or if symptoms persist or worsen. Patient is  aware when to return to the clinic for a follow-up visit. Patient educated on when it is appropriate to go to the emergency department.   [place] , NP-S  I personally was present during the history, physical exam, and medical decision-making activities of this visit and have verified that the services and findings are accurately documented in the nurse practitioner student's note.  Monia Pouch, FNP-C [facility] [address] [place],  [id number] [phone]

## 2022-02-04 ENCOUNTER — Other Ambulatory Visit: Payer: Self-pay

## 2022-02-04 ENCOUNTER — Ambulatory Visit (INDEPENDENT_AMBULATORY_CARE_PROVIDER_SITE_OTHER): Payer: PPO | Admitting: Internal Medicine

## 2022-02-04 DIAGNOSIS — I4891 Unspecified atrial fibrillation: Secondary | ICD-10-CM | POA: Diagnosis not present

## 2022-02-04 DIAGNOSIS — Z5181 Encounter for therapeutic drug level monitoring: Secondary | ICD-10-CM | POA: Diagnosis not present

## 2022-02-04 DIAGNOSIS — I4821 Permanent atrial fibrillation: Secondary | ICD-10-CM

## 2022-02-04 DIAGNOSIS — Z7901 Long term (current) use of anticoagulants: Secondary | ICD-10-CM

## 2022-02-04 LAB — BMP8+EGFR
BUN/Creatinine Ratio: 18 (ref 12–28)
BUN: 12 mg/dL (ref 10–36)
CO2: 22 mmol/L (ref 20–29)
Calcium: 9.1 mg/dL (ref 8.7–10.3)
Chloride: 98 mmol/L (ref 96–106)
Creatinine, Ser: 0.65 mg/dL (ref 0.57–1.00)
Glucose: 97 mg/dL (ref 70–99)
Potassium: 4 mmol/L (ref 3.5–5.2)
Sodium: 137 mmol/L (ref 134–144)
eGFR: 80 mL/min/{1.73_m2} (ref >59)

## 2022-02-04 LAB — CBC WITH DIFFERENTIAL/PLATELET
Basophils Absolute: 0 10*3/uL (ref 0.0–0.2)
Basos: 0 %
EOS (ABSOLUTE): 0.3 10*3/uL (ref 0.0–0.4)
Eos: 4 %
Hematocrit: 39.4 % (ref 34.0–46.6)
Hemoglobin: 13.2 g/dL (ref 11.1–15.9)
Immature Grans (Abs): 0 10*3/uL (ref 0.0–0.1)
Immature Granulocytes: 0 %
Lymphocytes Absolute: 2 10*3/uL (ref 0.7–3.1)
Lymphs: 27 %
MCH: 30.8 pg (ref 26.6–33.0)
MCHC: 33.5 g/dL (ref 31.5–35.7)
MCV: 92 fL (ref 79–97)
Monocytes Absolute: 0.8 10*3/uL (ref 0.1–0.9)
Monocytes: 10 %
Neutrophils Absolute: 4.5 10*3/uL (ref 1.4–7.0)
Neutrophils: 59 %
Platelets: 231 10*3/uL (ref 150–450)
RBC: 4.28 x10E6/uL (ref 3.77–5.28)
RDW: 12.6 % (ref 11.7–15.4)
WBC: 7.6 10*3/uL (ref 3.4–10.8)

## 2022-02-04 LAB — POCT INR
INR: 1.7 — AB (ref 2.0–3.0)
INR: 2.3 (ref 2.0–3.0)

## 2022-02-05 ENCOUNTER — Telehealth: Payer: Self-pay

## 2022-02-05 NOTE — Telephone Encounter (Signed)
I spoke to [facility]) [facility].  We discussed pt's INR result and I thanked her for the assistance with INR check.  She verbalized understanding

## 2022-02-13 ENCOUNTER — Other Ambulatory Visit: Payer: Self-pay | Admitting: Family Medicine

## 2022-02-15 ENCOUNTER — Ambulatory Visit (INDEPENDENT_AMBULATORY_CARE_PROVIDER_SITE_OTHER): Payer: PPO | Admitting: Family Medicine

## 2022-02-15 ENCOUNTER — Encounter: Payer: Self-pay | Admitting: Family Medicine

## 2022-02-15 VITALS — BP 132/53 | HR 120 | Temp 97.9°F | Ht 65.0 in | Wt 145.2 lb

## 2022-02-15 DIAGNOSIS — Z7901 Long term (current) use of anticoagulants: Secondary | ICD-10-CM | POA: Diagnosis not present

## 2022-02-15 DIAGNOSIS — Z789 Other specified health status: Secondary | ICD-10-CM

## 2022-02-15 DIAGNOSIS — Z7409 Other reduced mobility: Secondary | ICD-10-CM

## 2022-02-15 DIAGNOSIS — G8929 Other chronic pain: Secondary | ICD-10-CM

## 2022-02-15 DIAGNOSIS — R54 Age-related physical debility: Secondary | ICD-10-CM | POA: Diagnosis not present

## 2022-02-15 DIAGNOSIS — Z9181 History of falling: Secondary | ICD-10-CM

## 2022-02-15 DIAGNOSIS — R5381 Other malaise: Secondary | ICD-10-CM | POA: Diagnosis not present

## 2022-02-15 DIAGNOSIS — M545 Low back pain, unspecified: Secondary | ICD-10-CM

## 2022-02-15 LAB — COAGUCHEK XS/INR WAIVED
INR: 1.8 — ABNORMAL HIGH (ref 0.9–1.1)
Prothrombin Time: 21 s

## 2022-02-15 NOTE — Patient Instructions (Signed)
Will try and get you a hospital bed and a COUMADIN checking device for home. ?Keep appointment with Cardiology to recheck INR in a week ?

## 2022-02-15 NOTE — Progress Notes (Signed)
Subjective: CC: INR check PCP: Janora Norlander, DO [place]:VZCHYIF E Laffey is a 88 y.o. female presenting to clinic today for:  1.  Atrial fibrillation Patient was just seen by her cardiologist for INR check a couple of weeks ago but is here to have this done again today.  INR was therapeutic at last visit and she was advised to follow-up in 1 month.  Her next appointment will be on 17 March.  She is not having any unusual bleeding episodes or runs of atrial fibrillation that she can tell.  She has noticed a huge change in her mobility however but primarily it from her right knee issues.  She reports discomfort in that right knee.  She has been utilizing a lift chair which has helped some but she admits that she is not nearly as mobile as she used to be.  She has a wheelchair at home if needed and finds that she is more reliant on this wheelchair.  She finds it very difficult to utilize her walker these days.  She has not been sleeping in her bed due to this pain and difficulty with mobility but would be interested in potentially getting a hospital bed which might be more helpful.   ROS: Per HPI  Allergies  Allergen Reactions   Contrast Media [Iodinated Contrast Media] Hives   Amoxicillin Itching   Ampicillin Itching   Cefdinir Nausea And Vomiting   Cortisol [Hydrocortisone] Itching   Ivp Dye [Iodinated Contrast Media] Hives   Levaquin [Levofloxacin] Itching and Rash   Penicillins Rash    "haven't had it in years"   Vancomycin Itching and Rash   Past Medical History:  Diagnosis Date   AAA (abdominal aortic aneurysm)    Allergy    Anxiety    Bladder cancer ([place]) dx'd 2011   Chronic microscopic hematuria; transitional cell cancer   Blood transfusion 1960   "related to hysterectomy"   Chronic atrial fibrillation ([place])    Anticoagulated with warfarin, rate control with diltiazem and Toprol   Chronic back pain    Chronic headache    "probably 2/wk" (08/28/2015)   Concussion  w/o coma 0/27/7412   Complicated by subarachnoid hemorrhage.  "even now has times when she's not able to comprehend" (05/08/12)   Coronary artery disease    Nonobstructive   Diverticulitis    Diverticulosis    DVT (deep venous thrombosis), right 2005   "right calf after 8 foot fall"   Edema of both legs    Chronic, thought to be secondary to DVTs   External hemorrhoids    Family history of adverse reaction to anesthesia 2012   daughter "had OR for crushed hand; had problems w/anesthesia & I was in there all day long" (   Fatty liver    Frequent UTI    GERD (gastroesophageal reflux disease)    H/O hiatal hernia    High cholesterol    Migraine    "rare now" (08/28/2015)   Pneumonia ~ 2010;  2013; 07/2015   Rheumatoid arthritis(714.0)    "hands" (08/28/2015)   Urinary frequency     Current Outpatient Medications:    acetaminophen (TYLENOL) 500 MG tablet, Take 500 mg by mouth every 6 (six) hours as needed for mild pain or moderate pain., Disp: , Rfl:    atorvastatin (LIPITOR) 10 MG tablet, TAKE 1 TABLET BY MOUTH EVERY DAY, Disp: 90 tablet, Rfl: 0   baclofen (LIORESAL) 10 MG tablet, Take 1 tablet (10 mg total) by  mouth 3 (three) times daily., Disp: 60 each, Rfl: 0   calcium carbonate (OS-CAL - DOSED IN MG OF ELEMENTAL CALCIUM) 1250 MG tablet, Take 1 tablet by mouth every morning. , Disp: , Rfl:    cholecalciferol (VITAMIN D) 1000 UNITS tablet, Take 1,000 Units by mouth daily., Disp: , Rfl:    DILT-XR 240 MG 24 hr capsule, TAKE 1 CAPSULE BY MOUTH EVERY MORNING ON AN EMPTY STOMACH, Disp: 90 capsule, Rfl: 0   furosemide (LASIX) 20 MG tablet, TAKE ONE TO TWO TABLETS EVERY DAY AS INSTRUCTED, Disp: 180 tablet, Rfl: 1   loratadine (CLARITIN) 10 MG tablet, TAKE 1 TABLET (10 MG TOTAL) BY MOUTH DAILY AS NEEDED FOR ALLERGIES OR ITCHING., Disp: 90 tablet, Rfl: 1   mirtazapine (REMERON) 7.5 MG tablet, TAKE 1 TABLET BY MOUTH EVERYDAY AT BEDTIME, Disp: 90 tablet, Rfl: 0   Misc. Devices (TOILET SAFETY  FRAME) MISC, UAD, Disp: 1 each, Rfl: 0   Multiple Vitamins-Minerals (PRESERVISION AREDS 2+MULTI VIT PO), Take by mouth., Disp: , Rfl:    olopatadine (PATANOL) 0.1 % ophthalmic solution, PLACE 1 DROP IN AFFECTED EYE(S) TWICE DAILY (Patient taking differently: Place 1 drop into both eyes 2 (two) times daily.), Disp: 15 mL, Rfl: 5   Omega-3 Fatty Acids (FISH OIL) 1000 MG CAPS, Take 1,000 mg by mouth daily., Disp: , Rfl:    omeprazole (PRILOSEC) 20 MG capsule, TAKE 1 CAPSULE BY MOUTH EVERY DAY, Disp: 90 capsule, Rfl: 1   ondansetron (ZOFRAN-ODT) 4 MG disintegrating tablet, TAKE 1 TABLET BY MOUTH EVERY 8 HOURS AS NEEDED FOR NAUSEA, Disp: 10 tablet, Rfl: 1   PROAIR HFA 108 (90 Base) MCG/ACT inhaler, TAKE 2 PUFFS BY MOUTH EVERY 6 HOURS AS NEEDED FOR WHEEZE OR SHORTNESS OF BREATH, Disp: 8.5 Inhaler, Rfl: 4   Vibegron (GEMTESA) 75 MG TABS, Take 1 tablet by mouth daily., Disp: 30 tablet, Rfl: 3   warfarin (COUMADIN) 3 MG tablet, TAKE 1 TABLET BY MOUTH EVERY DAY EXCEPT TAKE 2 ON MONDAY, WEDNESDAY, FRIDAY, AND SATURDAY, Disp: 140 tablet, Rfl: 2 Social History   Socioeconomic History   Marital status: Widowed    Spouse name: Not on file   Number of children: 3   Years of education: Not on file   Highest education level: 8th grade  Occupational History   Occupation: Retired  Tobacco Use   Smoking status: Former    Packs/day: 1.00    Years: 40.00    Pack years: 40.00    Types: Cigarettes    Quit date: 11/07/1982    Years since quitting: 39.3   Smokeless tobacco: Former    Types: Snuff  Vaping Use   Vaping Use: Never used  Substance and Sexual Activity   Alcohol use: Yes    Alcohol/week: 7.0 standard drinks    Types: 7 Glasses of wine per week    Comment: 1 glass of wine prior to bed   Drug use: No   Sexual activity: Not Currently    Birth control/protection: Surgical  Other Topics Concern   Not on file  Social History Narrative   ** Merged History Encounter **       She is a widowed  mother of 82, grandmother of 69, great grandmother of 25. She does not   really get routine exercise. She quit smoking in 1983. She does not smoke and does not use illicit drugs and does not drink.   Social Determinants of Health   Financial Resource Strain: Not on file  Food  Insecurity: Not on file  Transportation Needs: Not on file  Physical Activity: Not on file  Stress: Not on file  Social Connections: Not on file  Intimate Partner Violence: Not on file   Family History  Problem Relation Age of Onset   Anesthesia problems Daughter    Cancer Mother        uterine   Cancer Sister        leukemia   Cancer Brother    Prostate cancer Brother    Cancer Brother    Breast cancer Other        neice   Heart disease Brother    Cancer Brother    Heart attack Sister    Colon cancer Neg Hx     Objective: Office vital signs reviewed. BP (!) 132/53    Pulse (!) 120    Temp 97.9 F (36.6 C)    Ht '5\' 5"'$  (1.651 m)    Wt 145 lb 3.2 oz (65.9 kg)    SpO2 96%    BMI 24.16 kg/m   Physical Examination:  General: Awake, alert, well nourished, No acute distress HEENT: Sclera white.  Moist mucous membranes Cardio: regular rate and rhythm Pulm: clear to auscultation bilaterally, no wheezes, rhonchi or rales; normal work of breathing on room air Extremities: warm, well perfused, bilateral pitting edema present MSK: Arrives in wheelchair.  Degenerative changes noted in the knees bilaterally with right worse than left.  Assessment/ Plan: 88 y.o. female   Chronic anticoagulation - Plan: CoaguChek XS/INR Waived, For home use only DME Hospital bed  Physical deconditioning - Plan: For home use only DME Hospital bed  At high risk for injury related to fall - Plan: For home use only DME Hospital bed  Frailty syndrome in geriatric patient - Plan: For home use only DME Hospital bed  Chronic low back pain without sciatica, unspecified back pain laterality - Plan: For home use only DME Hospital  bed  Impaired mobility and activities of daily living - Plan: For home use only DME Hospital bed  INR subtherapeutic at 1.8.  She is due for 2 tablets of her Coumadin today so have not made any changes.  She has close follow-up with cardiology so we will await her recheck.  In the meantime I am going to see if I can get her an INR machine to have at home since her mobility seems to be worsening.  We would still plan to follow-up with her maybe every 3 to 4 months for check-in's but this may make things a little easier for her from a mobility standpoint  I have also ordered a hospital bed for her and we will fax to the appropriate agency.  I do think that having something like that in place would help with not only lower extremity edema but as well as comfort in reducing her falls.  Most certainly would not want her to fall on chronic anticoagulation  Orders Placed This Encounter  Procedures   CoaguChek XS/INR Waived   No orders of the defined types were placed in this encounter.    Janora Norlander, DO [place] [phone]

## 2022-02-17 ENCOUNTER — Emergency Department (HOSPITAL_COMMUNITY): Payer: PPO

## 2022-02-17 ENCOUNTER — Other Ambulatory Visit: Payer: Self-pay

## 2022-02-17 ENCOUNTER — Encounter (HOSPITAL_COMMUNITY): Payer: Self-pay | Admitting: Family Medicine

## 2022-02-17 ENCOUNTER — Inpatient Hospital Stay (HOSPITAL_COMMUNITY): Payer: PPO

## 2022-02-17 ENCOUNTER — Inpatient Hospital Stay (HOSPITAL_COMMUNITY)
Admission: EM | Admit: 2022-02-17 | Discharge: 2022-03-13 | DRG: 177 | Disposition: E | Payer: PPO | Attending: Family Medicine | Admitting: Family Medicine

## 2022-02-17 ENCOUNTER — Other Ambulatory Visit (HOSPITAL_COMMUNITY): Payer: Self-pay | Admitting: *Deleted

## 2022-02-17 DIAGNOSIS — I4821 Permanent atrial fibrillation: Secondary | ICD-10-CM | POA: Diagnosis present

## 2022-02-17 DIAGNOSIS — I739 Peripheral vascular disease, unspecified: Secondary | ICD-10-CM | POA: Diagnosis present

## 2022-02-17 DIAGNOSIS — I119 Hypertensive heart disease without heart failure: Secondary | ICD-10-CM | POA: Diagnosis present

## 2022-02-17 DIAGNOSIS — E222 Syndrome of inappropriate secretion of antidiuretic hormone: Secondary | ICD-10-CM | POA: Diagnosis present

## 2022-02-17 DIAGNOSIS — I10 Essential (primary) hypertension: Secondary | ICD-10-CM | POA: Diagnosis present

## 2022-02-17 DIAGNOSIS — B3749 Other urogenital candidiasis: Secondary | ICD-10-CM | POA: Diagnosis present

## 2022-02-17 DIAGNOSIS — R410 Disorientation, unspecified: Secondary | ICD-10-CM | POA: Diagnosis present

## 2022-02-17 DIAGNOSIS — J9601 Acute respiratory failure with hypoxia: Secondary | ICD-10-CM | POA: Diagnosis present

## 2022-02-17 DIAGNOSIS — Z96651 Presence of right artificial knee joint: Secondary | ICD-10-CM | POA: Diagnosis present

## 2022-02-17 DIAGNOSIS — I083 Combined rheumatic disorders of mitral, aortic and tricuspid valves: Secondary | ICD-10-CM | POA: Diagnosis present

## 2022-02-17 DIAGNOSIS — R9431 Abnormal electrocardiogram [ECG] [EKG]: Secondary | ICD-10-CM | POA: Diagnosis not present

## 2022-02-17 DIAGNOSIS — Z7901 Long term (current) use of anticoagulants: Secondary | ICD-10-CM | POA: Diagnosis not present

## 2022-02-17 DIAGNOSIS — I712 Thoracic aortic aneurysm, without rupture, unspecified: Secondary | ICD-10-CM | POA: Diagnosis present

## 2022-02-17 DIAGNOSIS — Z7189 Other specified counseling: Secondary | ICD-10-CM

## 2022-02-17 DIAGNOSIS — Z79899 Other long term (current) drug therapy: Secondary | ICD-10-CM | POA: Diagnosis not present

## 2022-02-17 DIAGNOSIS — I4891 Unspecified atrial fibrillation: Secondary | ICD-10-CM | POA: Diagnosis not present

## 2022-02-17 DIAGNOSIS — R1310 Dysphagia, unspecified: Secondary | ICD-10-CM | POA: Diagnosis present

## 2022-02-17 DIAGNOSIS — I5189 Other ill-defined heart diseases: Secondary | ICD-10-CM

## 2022-02-17 DIAGNOSIS — Z87440 Personal history of urinary (tract) infections: Secondary | ICD-10-CM

## 2022-02-17 DIAGNOSIS — Z20822 Contact with and (suspected) exposure to covid-19: Secondary | ICD-10-CM | POA: Diagnosis present

## 2022-02-17 DIAGNOSIS — I2510 Atherosclerotic heart disease of native coronary artery without angina pectoris: Secondary | ICD-10-CM | POA: Diagnosis present

## 2022-02-17 DIAGNOSIS — Z781 Physical restraint status: Secondary | ICD-10-CM | POA: Diagnosis not present

## 2022-02-17 DIAGNOSIS — Z86718 Personal history of other venous thrombosis and embolism: Secondary | ICD-10-CM

## 2022-02-17 DIAGNOSIS — I700 Atherosclerosis of aorta: Secondary | ICD-10-CM | POA: Diagnosis present

## 2022-02-17 DIAGNOSIS — J189 Pneumonia, unspecified organism: Secondary | ICD-10-CM | POA: Diagnosis not present

## 2022-02-17 DIAGNOSIS — K5730 Diverticulosis of large intestine without perforation or abscess without bleeding: Secondary | ICD-10-CM | POA: Diagnosis present

## 2022-02-17 DIAGNOSIS — Z8249 Family history of ischemic heart disease and other diseases of the circulatory system: Secondary | ICD-10-CM

## 2022-02-17 DIAGNOSIS — I272 Pulmonary hypertension, unspecified: Secondary | ICD-10-CM | POA: Diagnosis present

## 2022-02-17 DIAGNOSIS — J690 Pneumonitis due to inhalation of food and vomit: Secondary | ICD-10-CM | POA: Diagnosis present

## 2022-02-17 DIAGNOSIS — Z90711 Acquired absence of uterus with remaining cervical stump: Secondary | ICD-10-CM

## 2022-02-17 DIAGNOSIS — Z515 Encounter for palliative care: Secondary | ICD-10-CM | POA: Diagnosis not present

## 2022-02-17 DIAGNOSIS — Z66 Do not resuscitate: Secondary | ICD-10-CM | POA: Diagnosis not present

## 2022-02-17 DIAGNOSIS — Z888 Allergy status to other drugs, medicaments and biological substances status: Secondary | ICD-10-CM

## 2022-02-17 DIAGNOSIS — Z91041 Radiographic dye allergy status: Secondary | ICD-10-CM

## 2022-02-17 DIAGNOSIS — M069 Rheumatoid arthritis, unspecified: Secondary | ICD-10-CM | POA: Diagnosis present

## 2022-02-17 DIAGNOSIS — K219 Gastro-esophageal reflux disease without esophagitis: Secondary | ICD-10-CM | POA: Diagnosis present

## 2022-02-17 DIAGNOSIS — E785 Hyperlipidemia, unspecified: Secondary | ICD-10-CM | POA: Diagnosis present

## 2022-02-17 DIAGNOSIS — Z880 Allergy status to penicillin: Secondary | ICD-10-CM

## 2022-02-17 DIAGNOSIS — Z881 Allergy status to other antibiotic agents status: Secondary | ICD-10-CM

## 2022-02-17 LAB — CBC WITH DIFFERENTIAL/PLATELET
Abs Immature Granulocytes: 0.05 10*3/uL (ref 0.00–0.07)
Basophils Absolute: 0 10*3/uL (ref 0.0–0.1)
Basophils Relative: 0 %
Eosinophils Absolute: 0.1 10*3/uL (ref 0.0–0.5)
Eosinophils Relative: 1 %
HCT: 38.9 % (ref 36.0–46.0)
Hemoglobin: 13 g/dL (ref 12.0–15.0)
Immature Granulocytes: 1 %
Lymphocytes Relative: 9 %
Lymphs Abs: 0.9 10*3/uL (ref 0.7–4.0)
MCH: 32.7 pg (ref 26.0–34.0)
MCHC: 33.4 g/dL (ref 30.0–36.0)
MCV: 97.7 fL (ref 80.0–100.0)
Monocytes Absolute: 1 10*3/uL (ref 0.1–1.0)
Monocytes Relative: 10 %
Neutro Abs: 7.6 10*3/uL (ref 1.7–7.7)
Neutrophils Relative %: 79 %
Platelets: 172 10*3/uL (ref 150–400)
RBC: 3.98 MIL/uL (ref 3.87–5.11)
RDW: 13.8 % (ref 11.5–15.5)
WBC: 9.6 10*3/uL (ref 4.0–10.5)
nRBC: 0 % (ref 0.0–0.2)

## 2022-02-17 LAB — URINALYSIS, ROUTINE W REFLEX MICROSCOPIC
Bacteria, UA: NONE SEEN
Bilirubin Urine: NEGATIVE
Glucose, UA: NEGATIVE mg/dL
Ketones, ur: 5 mg/dL — AB
Nitrite: NEGATIVE
Protein, ur: 30 mg/dL — AB
RBC / HPF: >50 RBC/hpf — ABNORMAL HIGH (ref 0–5)
Specific Gravity, Urine: 1.016 (ref 1.005–1.030)
pH: 5 (ref 5.0–8.0)

## 2022-02-17 LAB — COMPREHENSIVE METABOLIC PANEL
ALT: 17 U/L (ref 0–44)
AST: 21 U/L (ref 15–41)
Albumin: 3.5 g/dL (ref 3.5–5.0)
Alkaline Phosphatase: 93 U/L (ref 38–126)
Anion gap: 10 (ref 5–15)
BUN: 13 mg/dL (ref 8–23)
CO2: 26 mmol/L (ref 22–32)
Calcium: 8.7 mg/dL — ABNORMAL LOW (ref 8.9–10.3)
Chloride: 95 mmol/L — ABNORMAL LOW (ref 98–111)
Creatinine, Ser: 0.69 mg/dL (ref 0.44–1.00)
GFR, Estimated: >60 mL/min (ref >60)
Glucose, Bld: 135 mg/dL — ABNORMAL HIGH (ref 70–99)
Potassium: 4.2 mmol/L (ref 3.5–5.1)
Sodium: 131 mmol/L — ABNORMAL LOW (ref 135–145)
Total Bilirubin: 1 mg/dL (ref 0.3–1.2)
Total Protein: 6.8 g/dL (ref 6.5–8.1)

## 2022-02-17 LAB — BLOOD GAS, ARTERIAL
Acid-base deficit: 0.4 mmol/L (ref 0.0–2.0)
Bicarbonate: 25.4 mmol/L (ref 20.0–28.0)
Drawn by: 23430
FIO2: 60 %
O2 Saturation: 91.5 %
Patient temperature: 36.7
pCO2 arterial: 44 mmHg (ref 32–48)
pH, Arterial: 7.36 (ref 7.35–7.45)
pO2, Arterial: 62 mmHg — ABNORMAL LOW (ref 83–108)

## 2022-02-17 LAB — LACTIC ACID, PLASMA: Lactic Acid, Venous: 1.4 mmol/L (ref 0.5–1.9)

## 2022-02-17 LAB — LIPASE, BLOOD: Lipase: 69 U/L — ABNORMAL HIGH (ref 11–51)

## 2022-02-17 LAB — PROTIME-INR
INR: 2 — ABNORMAL HIGH (ref 0.8–1.2)
Prothrombin Time: 22.4 seconds — ABNORMAL HIGH (ref 11.4–15.2)

## 2022-02-17 LAB — RESP PANEL BY RT-PCR (FLU A&B, COVID) ARPGX2
Influenza A by PCR: NEGATIVE
Influenza B by PCR: NEGATIVE
SARS Coronavirus 2 by RT PCR: NEGATIVE

## 2022-02-17 LAB — MAGNESIUM: Magnesium: 1.9 mg/dL (ref 1.7–2.4)

## 2022-02-17 LAB — TROPONIN I (HIGH SENSITIVITY)
Troponin I (High Sensitivity): 18 ng/L — ABNORMAL HIGH (ref <18)
Troponin I (High Sensitivity): 18 ng/L — ABNORMAL HIGH (ref <18)

## 2022-02-17 LAB — TSH: TSH: 2.857 u[IU]/mL (ref 0.350–4.500)

## 2022-02-17 LAB — BRAIN NATRIURETIC PEPTIDE: B Natriuretic Peptide: 396 pg/mL — ABNORMAL HIGH (ref 0.0–100.0)

## 2022-02-17 MED ORDER — MORPHINE SULFATE (PF) 2 MG/ML IV SOLN
INTRAVENOUS | Status: AC
  Administered 2022-02-17: 1 mg via INTRAVENOUS
  Filled 2022-02-17: qty 1

## 2022-02-17 MED ORDER — ACETAMINOPHEN 650 MG RE SUPP: 650.0000 mg | Freq: Four times a day (QID) | RECTAL | Status: DC | PRN

## 2022-02-17 MED ORDER — SODIUM CHLORIDE 0.9% FLUSH
3.0000 mL | Freq: Two times a day (BID) | INTRAVENOUS | Status: DC
  Administered 2022-02-17 – 2022-02-21 (×6): 3 mL via INTRAVENOUS

## 2022-02-17 MED ORDER — SODIUM CHLORIDE 0.9% FLUSH: 3.0000 mL | INTRAVENOUS | Status: DC | PRN

## 2022-02-17 MED ORDER — LEVALBUTEROL HCL 0.63 MG/3ML IN NEBU
0.6300 mg | INHALATION_SOLUTION | Freq: Four times a day (QID) | RESPIRATORY_TRACT | Status: DC
  Administered 2022-02-17 – 2022-02-18 (×6): 0.63 mg via RESPIRATORY_TRACT
  Filled 2022-02-17 (×5): qty 3

## 2022-02-17 MED ORDER — OLOPATADINE HCL 0.1 % OP SOLN
1.0000 [drp] | Freq: Two times a day (BID) | OPHTHALMIC | Status: DC
  Administered 2022-02-17 – 2022-02-21 (×8): 1 [drp] via OPHTHALMIC
  Filled 2022-02-17: qty 5

## 2022-02-17 MED ORDER — MORPHINE SULFATE (PF) 2 MG/ML IV SOLN
1.0000 mg | INTRAVENOUS | Status: DC | PRN
  Administered 2022-02-17 – 2022-02-19 (×5): 2 mg via INTRAVENOUS
  Filled 2022-02-17 (×5): qty 1

## 2022-02-17 MED ORDER — TRAZODONE HCL 50 MG PO TABS
50.0000 mg | ORAL_TABLET | Freq: Every evening | ORAL | Status: DC | PRN
  Administered 2022-02-18 – 2022-02-19 (×2): 50 mg via ORAL
  Filled 2022-02-17 (×2): qty 1

## 2022-02-17 MED ORDER — ONDANSETRON HCL 4 MG/2ML IJ SOLN: 4.0000 mg | Freq: Four times a day (QID) | INTRAMUSCULAR | Status: DC | PRN

## 2022-02-17 MED ORDER — SODIUM CHLORIDE 0.9% FLUSH
3.0000 mL | Freq: Two times a day (BID) | INTRAVENOUS | Status: DC
  Administered 2022-02-17 – 2022-02-21 (×8): 3 mL via INTRAVENOUS

## 2022-02-17 MED ORDER — POLYETHYLENE GLYCOL 3350 17 G PO PACK: 17.0000 g | PACK | Freq: Every day | ORAL | Status: DC | PRN

## 2022-02-17 MED ORDER — DILTIAZEM LOAD VIA INFUSION
20.0000 mg | Freq: Once | INTRAVENOUS | Status: AC
  Administered 2022-02-17: 20 mg via INTRAVENOUS
  Filled 2022-02-17: qty 20

## 2022-02-17 MED ORDER — ATORVASTATIN CALCIUM 10 MG PO TABS
10.0000 mg | ORAL_TABLET | Freq: Every day | ORAL | Status: DC
  Administered 2022-02-18 – 2022-02-19 (×2): 10 mg via ORAL
  Filled 2022-02-17 (×2): qty 1

## 2022-02-17 MED ORDER — LORAZEPAM 2 MG/ML IJ SOLN
0.5000 mg | Freq: Once | INTRAMUSCULAR | Status: AC
Start: 2022-02-17 — End: 2022-02-17
  Administered 2022-02-17: 0.5 mg via INTRAVENOUS
  Filled 2022-02-17: qty 1

## 2022-02-17 MED ORDER — LORAZEPAM 2 MG/ML IJ SOLN: 0.5000 mg | Freq: Once | INTRAMUSCULAR | Status: AC

## 2022-02-17 MED ORDER — SODIUM CHLORIDE 0.9 % IV SOLN: 250.0000 mL | INTRAVENOUS | Status: DC | PRN

## 2022-02-17 MED ORDER — ALPRAZOLAM 0.5 MG PO TABS: 1.0000 mg | ORAL_TABLET | Freq: Once | ORAL | Status: DC

## 2022-02-17 MED ORDER — LEVALBUTEROL HCL 0.63 MG/3ML IN NEBU
INHALATION_SOLUTION | RESPIRATORY_TRACT | Status: AC
  Filled 2022-02-17: qty 3

## 2022-02-17 MED ORDER — ACETAMINOPHEN 325 MG PO TABS: 650.0000 mg | ORAL_TABLET | Freq: Four times a day (QID) | ORAL | Status: DC | PRN

## 2022-02-17 MED ORDER — FLUCONAZOLE 100 MG PO TABS
200.0000 mg | ORAL_TABLET | Freq: Every day | ORAL | Status: AC
  Administered 2022-02-18 – 2022-02-19 (×2): 200 mg via ORAL
  Filled 2022-02-17 (×2): qty 2

## 2022-02-17 MED ORDER — PANTOPRAZOLE SODIUM 40 MG PO TBEC
40.0000 mg | DELAYED_RELEASE_TABLET | Freq: Every day | ORAL | Status: DC
  Administered 2022-02-18 – 2022-02-19 (×2): 40 mg via ORAL
  Filled 2022-02-17 (×2): qty 1

## 2022-02-17 MED ORDER — LORAZEPAM 2 MG/ML IJ SOLN
INTRAMUSCULAR | Status: AC
  Administered 2022-02-17: 0.5 mg via INTRAVENOUS
  Filled 2022-02-17: qty 1

## 2022-02-17 MED ORDER — WARFARIN - PHARMACIST DOSING INPATIENT: Freq: Every day | Status: DC

## 2022-02-17 MED ORDER — SODIUM CHLORIDE 0.9 % IV SOLN
1.0000 g | Freq: Once | INTRAVENOUS | Status: AC
  Administered 2022-02-17: 1 g via INTRAVENOUS
  Filled 2022-02-17: qty 10

## 2022-02-17 MED ORDER — MIRTAZAPINE 15 MG PO TABS
7.5000 mg | ORAL_TABLET | Freq: Every day | ORAL | Status: DC
  Administered 2022-02-18 – 2022-02-19 (×2): 7.5 mg via ORAL
  Filled 2022-02-17 (×3): qty 1

## 2022-02-17 MED ORDER — ONDANSETRON HCL 4 MG/2ML IJ SOLN
4.0000 mg | Freq: Once | INTRAMUSCULAR | Status: AC
  Administered 2022-02-17: 4 mg via INTRAVENOUS
  Filled 2022-02-17: qty 2

## 2022-02-17 MED ORDER — DILTIAZEM HCL-DEXTROSE 125-5 MG/125ML-% IV SOLN (PREMIX)
5.0000 mg/h | INTRAVENOUS | Status: DC
  Administered 2022-02-17: 5 mg/h via INTRAVENOUS
  Administered 2022-02-17 – 2022-02-19 (×4): 15 mg/h via INTRAVENOUS
  Filled 2022-02-17 (×8): qty 125

## 2022-02-17 MED ORDER — CHLORHEXIDINE GLUCONATE CLOTH 2 % EX PADS
6.0000 | MEDICATED_PAD | Freq: Every day | CUTANEOUS | Status: DC
  Administered 2022-02-18 – 2022-02-21 (×4): 6 via TOPICAL

## 2022-02-17 MED ORDER — VIBEGRON 75 MG PO TABS
1.0000 | ORAL_TABLET | Freq: Every day | ORAL | Status: DC
  Filled 2022-02-17: qty 1

## 2022-02-17 MED ORDER — WARFARIN SODIUM 2 MG PO TABS: 3.0000 mg | ORAL_TABLET | ORAL | Status: DC

## 2022-02-17 MED ORDER — BISACODYL 10 MG RE SUPP: 10.0000 mg | Freq: Every day | RECTAL | Status: DC | PRN

## 2022-02-17 MED ORDER — ONDANSETRON HCL 4 MG PO TABS: 4.0000 mg | ORAL_TABLET | Freq: Four times a day (QID) | ORAL | Status: DC | PRN

## 2022-02-17 MED ORDER — SODIUM CHLORIDE 0.9 % IV SOLN
2.0000 g | INTRAVENOUS | Status: DC
  Administered 2022-02-18 – 2022-02-21 (×4): 2 g via INTRAVENOUS
  Filled 2022-02-17 (×4): qty 20

## 2022-02-17 MED ORDER — SODIUM CHLORIDE 0.9 % IV SOLN
500.0000 mg | INTRAVENOUS | Status: DC
  Administered 2022-02-17: 500 mg via INTRAVENOUS
  Filled 2022-02-17: qty 5

## 2022-02-17 MED ORDER — WARFARIN SODIUM 5 MG PO TABS: 6.0000 mg | ORAL_TABLET | ORAL | Status: DC

## 2022-02-17 MED ORDER — DM-GUAIFENESIN ER 30-600 MG PO TB12
1.0000 | ORAL_TABLET | Freq: Two times a day (BID) | ORAL | Status: DC
Start: 2022-02-17 — End: 2022-02-20
  Administered 2022-02-18 – 2022-02-19 (×4): 1 via ORAL
  Filled 2022-02-17 (×5): qty 1

## 2022-02-17 NOTE — H&P (Signed)
Patient Demographics:    [name], is a 88 y.o. female  MRN: [id number]   DOB - 1921-07-28  Admit Date - 02/18/2022  Outpatient Primary MD for the patient is Janora Norlander, DO   Assessment & Plan:   Assessment and Plan:         1)bilateral multifocal pneumonia--- chest x-ray and CTA chest report noted -Rocephin, azithromycin bronchodilators as ordered -WBC and lactic acid WNL -Blood cultures pending   2) acute hypoxic respiratory failure--- secondary to #1 above -Requiring high flow oxygen -Continue bronchodilators  3) A-fib with RVR-- h/o  chronic A-fib -RVR most likely due to #1 and #2 above -IV Cardizem for rate control -Echo requested --TSH WNL, potassium and magnesium WNL -Troponin is flat at 18 -EKG A-fib with RVR -Pulmonary consult for Coumadin management -BNP borderline elevated at 396  4) social/ethics--plan of care and advanced directive discussed with patient and patient's daughter Ms. Adah Perl--- they request partial code specifically no intubation otherwise no limitations to treatment -Official palliative care consult request  5) possible UTI--- UA with yeast treat empirically with Diflucan, antibiotics as above #1, pending culture  6) hyponatremia--sodium 131 in the setting of emesis and poor oral intake due to shortness of breath   Disposition/Need for in-Hospital Stay- patient unable to be discharged at this time due to --acute hypoxic respiratory failure secondary to bilateral pneumonia requiring IV antibiotics as well as A-fib with RVR requiring IV Cardizem and high flow oxygen*  Status is: Inpatient  Remains inpatient appropriate because:   Dispo: The patient is from: Home              Anticipated d/c is to: Home              Anticipated d/c date is:  > 3 days              Patient currently is not medically stable to d/c. Barriers: Not Clinically Stable-    With History of - Reviewed by me  Past Medical History:  Diagnosis Date   AAA (abdominal aortic aneurysm)    Allergy    Anxiety    Bladder cancer ([place]) dx'd 2011   Chronic microscopic hematuria; transitional cell cancer   Blood transfusion 1960   "related to hysterectomy"   Chronic atrial fibrillation ([place])    Anticoagulated with warfarin, rate control with diltiazem and Toprol   Chronic back pain    Chronic headache    "probably 2/wk" (08/28/2015)   Concussion w/o coma 8/67/6720   Complicated by subarachnoid hemorrhage.  "even now has times when she's not able to comprehend" (05/08/12)   Coronary artery disease    Nonobstructive   Diverticulitis    Diverticulosis    DVT (deep venous thrombosis), right 2005   "right calf after 8 foot fall"   Edema of both legs    Chronic, thought to be secondary to DVTs   External hemorrhoids  Family history of adverse reaction to anesthesia 2012   daughter "had OR for crushed hand; had problems w/anesthesia & I was in there all day long" (   Fatty liver    Frequent UTI    GERD (gastroesophageal reflux disease)    H/O hiatal hernia    High cholesterol    Migraine    "rare now" (08/28/2015)   Pneumonia ~ 2010;  2013; 07/2015   Rheumatoid arthritis(714.0)    "hands" (08/28/2015)   Urinary frequency       Past Surgical History:  Procedure Laterality Date   BREAST CYST EXCISION Left 1960's?   2 cysts; benign   CARDIAC CATHETERIZATION  1980's   CATARACT EXTRACTION W/ INTRAOCULAR LENS  IMPLANT, BILATERAL Bilateral 1992   CYSTOSCOPY  11/11/2011   Procedure: CYSTOSCOPY;  Surgeon: Malka So;  Location: [facility];  Service: Urology;  Laterality: N/A;   CYSTOSTOMY W/ BLADDER BIOPSY  10/08/2013   INCONTINENCE SURGERY  1980's   JOINT REPLACEMENT     KNEE ARTHROSCOPY Right 2005   S/P fall   Lower Extremity Venous Doppler  11/10/2013    No DVT or superficial thrombus enlarged inguinal lymph node noted in the right. No Baker's cyst.   TONSILLECTOMY  1952   TOTAL KNEE ARTHROPLASTY Right 02/05/2013   Procedure: TOTAL KNEE ARTHROPLASTY;  Surgeon: Mauri Pole, MD;  Location: [facility];  Service: Orthopedics;  Laterality: Right;   TRANSTHORACIC ECHOCARDIOGRAM  01/'15; 4/'19   a) EF 60-65%. NO RWMA. Ao Sclerosis. MAC - no MS with mild MR>  B Atriae mildly dilated;; b)  EF 55-60%. AoV Sclerosis (no stenosis).  Trivial AI & MR. Mild LAE & Severe RAE.. Mild RV dilation   TRANSURETHRAL RESECTION OF BLADDER TUMOR  11/11/2011   Procedure: TRANSURETHRAL RESECTION OF BLADDER TUMOR (TURBT);  Surgeon: Malka So;  Location: [facility];  Service: Urology;  Laterality: N/A;  Cysto, Bladder Biopsy, TURBT with [place],    TRANSURETHRAL RESECTION OF BLADDER TUMOR WITH GYRUS (TURBT-GYRUS)  2007; 2009; 2010   "for tumors on surface of bladder"   TUMOR EXCISION  1976; 1980's   "fatty tumor cut off her upper back; left thumb"   [place]   partial       Chief Complaint  Patient presents with   Chest Pain      HPI:    [name]  is a 88 y.o. female who is a reformed smoker with past medical history relevant for chronic A-fib, and history of DVT in the past, chronically on Coumadin for anticoagulation at baseline and Cardizem for rate control at baseline, as well as history of PAD, HTN, and HLD presents to the ED with concerns about worsening shortness of breath cough concerns about possible aspiration and 1 episode of blood-tinged vomitus-- -Patient is on Coumadin for A-fib and prior history of DVT -Had some chest discomfort associated with shortness of breath on admission -CT chest with bilateral multifocal pneumonia right more than left, -EKG with A-fib with RVR -Troponin flat at 18 -CBC and lactic acid WNL TSH WNL -UA suggestive of UTI with yeast in the urine as well -Magnesium is 1.9 -ABG on high flow oxygen is  reassuring -INR 2.0 -BNP 396 -Potassium is 4.2 with a sodium of 131 and creatinine 0.69 -Lipase is borderline high at 16 -Additional history obtained from patient's daughter Ms. Adah Perl at bedside -Patient remained tachycardic with heart rate in 140s requiring IV Cardizem for rate control -Hypoxia persisted requiring  high flow oxygen    Review of systems:    In addition to the HPI above,   A full Review of  Systems was done, all other systems reviewed are negative except as noted above in HPI , .    Social History:  Reviewed by me    Social History   Tobacco Use   Smoking status: Former    Packs/day: 1.00    Years: 40.00    Pack years: 40.00    Types: Cigarettes    Quit date: 11/07/1982    Years since quitting: 39.3   Smokeless tobacco: Former    Types: Snuff  Substance Use Topics   Alcohol use: Yes    Alcohol/week: 7.0 standard drinks    Types: 7 Glasses of wine per week    Comment: 1 glass of wine prior to bed       Family History :  Reviewed by me    Family History  Problem Relation Age of Onset   Anesthesia problems Daughter    Cancer Mother        uterine   Cancer Sister        leukemia   Cancer Brother    Prostate cancer Brother    Cancer Brother    Breast cancer Other        neice   Heart disease Brother    Cancer Brother    Heart attack Sister    Colon cancer Neg Hx      Home Medications:   Prior to Admission medications   Medication Sig Start Date End Date Taking? Authorizing Provider  acetaminophen (TYLENOL) 500 MG tablet Take 500 mg by mouth every 6 (six) hours as needed for mild pain or moderate pain.   Yes [provider]  atorvastatin (LIPITOR) 10 MG tablet TAKE 1 TABLET BY MOUTH EVERY DAY 12/30/21  Yes Gottschalk, Ashly M, DO  calcium carbonate (OS-CAL - DOSED IN MG OF ELEMENTAL CALCIUM) 1250 MG tablet Take 1 tablet by mouth every morning.    Yes [provider]  cholecalciferol (VITAMIN D) 1000 UNITS tablet  Take 1,000 Units by mouth daily.   Yes [provider]  DILT-XR 240 MG 24 hr capsule TAKE 1 CAPSULE BY MOUTH EVERY MORNING ON AN EMPTY STOMACH 01/28/22  Yes Gottschalk, Ashly M, DO  furosemide (LASIX) 20 MG tablet TAKE ONE TO TWO TABLETS EVERY DAY AS INSTRUCTED 02/13/22  Yes Gottschalk, Ashly M, DO  loratadine (CLARITIN) 10 MG tablet TAKE 1 TABLET (10 MG TOTAL) BY MOUTH DAILY AS NEEDED FOR ALLERGIES OR ITCHING. 11/16/21  Yes Gottschalk, Ashly M, DO  Multiple Vitamins-Minerals (PRESERVISION AREDS 2+MULTI VIT PO) Take by mouth.   Yes [provider]  olopatadine (PATANOL) 0.1 % ophthalmic solution PLACE 1 DROP IN AFFECTED EYE(S) TWICE DAILY Patient taking differently: Place 1 drop into both eyes 2 (two) times daily. 06/26/21  Yes Gottschalk, Leatrice Jewels M, DO  Omega-3 Fatty Acids (FISH OIL) 1000 MG CAPS Take 1,000 mg by mouth daily.   Yes [provider]  omeprazole (PRILOSEC) 20 MG capsule TAKE 1 CAPSULE BY MOUTH EVERY DAY 01/28/22  Yes Gottschalk, Ashly M, DO  ondansetron (ZOFRAN-ODT) 4 MG disintegrating tablet TAKE 1 TABLET BY MOUTH EVERY 8 HOURS AS NEEDED FOR NAUSEA 01/18/22  Yes Gottschalk, Ashly M, DO  Vibegron (GEMTESA) 75 MG TABS Take 1 tablet by mouth daily. 03/13/21  Yes Irine Seal, MD  warfarin (COUMADIN) 3 MG tablet TAKE 1 TABLET BY MOUTH EVERY DAY EXCEPT TAKE  2 ON MONDAY, WEDNESDAY, FRIDAY, AND SATURDAY 01/04/22  Yes Gottschalk, Ashly M, DO  baclofen (LIORESAL) 10 MG tablet Take 1 tablet (10 mg total) by mouth 3 (three) times daily. Patient not taking: Reported on 02/20/2022 10/26/21   Gwenlyn Perking, FNP  mirtazapine (REMERON) 7.5 MG tablet TAKE 1 TABLET BY MOUTH EVERYDAY AT BEDTIME Patient not taking: Reported on 03/10/2022 11/16/21   Janora Norlander, DO  Misc. Devices (TOILET SAFETY FRAME) MISC UAD 04/27/21   Gottschalk, Ashly M, DO  PROAIR HFA 108 [phone] Base) MCG/ACT inhaler TAKE 2 PUFFS BY MOUTH EVERY 6 HOURS AS NEEDED FOR WHEEZE OR SHORTNESS OF BREATH Patient not taking:  Reported on 02/20/2022 11/17/18   Janora Norlander, DO     Allergies:     Allergies  Allergen Reactions   Contrast Media [Iodinated Contrast Media] Hives   Amoxicillin Itching   Ampicillin Itching   Cefdinir Nausea And Vomiting   Cortisol [Hydrocortisone] Itching   Ivp Dye [Iodinated Contrast Media] Hives   Levaquin [Levofloxacin] Itching and Rash   Penicillins Rash    "haven't had it in years"   Vancomycin Itching and Rash     Physical Exam:   Vitals  Blood pressure 136/62, pulse (!) 112, temperature 98 F (36.7 C), temperature source Oral, resp. rate (!) 25, height _0  (1.651 m), weight 70.3 kg, SpO2 97 %.  Physical Examination: General appearance - alert, respiratory distress noted  mental status -sounds, somewhat disoriented at times Eyes - sclera anicteric Ears- HOH Neck - supple, no JVD elevation , Chest -diminished breath sounds with scattered rhonchi and wheezes bilaterally  heart - S1 and S2 normal, irregularly irregular and tachycardic abdomen - soft, nontender, nondistended, no masses or organomegaly Neurological - screening mental status exam normal, neck supple without rigidity, cranial nerves II through XII intact, DTR's normal and symmetric Extremities -chronic pedal edema noted, intact peripheral pulses  Skin - warm, dry     Data Review:    CBC Recent Labs  Lab 03/02/2022 0646  WBC 9.6  HGB 13.0  HCT 38.9  PLT 172  MCV 97.7  MCH 32.7  MCHC 33.4  RDW 13.8  LYMPHSABS 0.9  MONOABS 1.0  EOSABS 0.1  BASOSABS 0.0   ------------------------------------------------------------------------------------------------------------------  Chemistries  Recent Labs  Lab 02/23/2022 0646 02/10/2022 0853  NA 131*  --   K 4.2  --   CL 95*  --   CO2 26  --   GLUCOSE 135*  --   BUN 13  --   CREATININE 0.69  --   CALCIUM 8.7*  --   MG  --  1.9  AST 21  --   ALT 17  --   ALKPHOS 93  --   BILITOT 1.0  --     ------------------------------------------------------------------------------------------------------------------ estimated creatinine clearance is 38.6 mL/min (by C-G formula based on SCr of 0.69 mg/dL). ------------------------------------------------------------------------------------------------------------------ Recent Labs    02/10/2022 0645  TSH 2.857     Coagulation profile Recent Labs  Lab 02/15/22 1248 03/02/2022 0656  INR 1.8* 2.0*   ------------------------------------------------------------------------------------------------------------------- No results for input(s): DDIMER in the last 72 hours. -------------------------------------------------------------------------------------------------------------------  Cardiac Enzymes No results for input(s): CKMB, TROPONINI, MYOGLOBIN in the last 168 hours.  Invalid input(s): CK ------------------------------------------------------------------------------------------------------------------    Component Value Date/Time   BNP 396.0 (H) 02/26/2022 0646     ---------------------------------------------------------------------------------------------------------------  Urinalysis    Component Value Date/Time   COLORURINE YELLOW 02/16/2022 0905   APPEARANCEUR CLEAR 02/10/2022 0905   APPEARANCEUR Clear  04/14/2020 1414   LABSPEC 1.016 02/10/2022 0905   PHURINE 5.0 03/01/2022 0905   GLUCOSEU NEGATIVE 02/11/2022 0905   HGBUR LARGE (A) 02/12/2022 0905   BILIRUBINUR NEGATIVE 02/19/2022 0905   BILIRUBINUR Negative 04/14/2020 1414   KETONESUR 5 (A) 02/20/2022 0905   PROTEINUR 30 (A) 02/24/2022 0905   UROBILINOGEN 0.2 08/14/2015 0057   NITRITE NEGATIVE 02/27/2022 0905   LEUKOCYTESUR SMALL (A) 02/18/2022 0905    ----------------------------------------------------------------------------------------------------------------   Imaging Results:    CT Chest Wo Contrast  Result Date: 02/28/2022 CLINICAL DATA:  Chest  pain, shortness of breath. EXAM: CT CHEST WITHOUT CONTRAST TECHNIQUE: Multidetector CT imaging of the chest was performed following the standard protocol without IV contrast. RADIATION DOSE REDUCTION: This exam was performed according to the departmental dose-optimization program which includes automated exposure control, adjustment of the mA and/or kV according to patient size and/or use of iterative reconstruction technique. COMPARISON:  November 23, 2013. FINDINGS: Cardiovascular: Atherosclerosis of thoracic aorta is noted. 4.3 cm ascending thoracic aortic aneurysm is noted. Mild cardiomegaly is noted. No pericardial effusion is noted. Mediastinum/Nodes: No enlarged mediastinal or axillary lymph nodes. Thyroid gland, trachea, and esophagus demonstrate no significant findings. Lungs/Pleura: Small bilateral pleural effusions are noted. No pneumothorax is noted. Multiple patchy airspace opacities are noted in both lungs, right greater than left, concerning for multifocal pneumonia. Upper Abdomen: No acute abnormality. Musculoskeletal: No chest wall mass or suspicious bone lesions identified. IMPRESSION: Multiple patchy airspace opacities are noted in both lungs, right greater than left, consistent with multifocal pneumonia. Small bilateral pleural effusions are noted. 4.3 cm ascending thoracic aortic aneurysm. Recommend annual imaging followup by CTA or MRA. This recommendation follows 2010 ACCF/AHA/AATS/ACR/ASA/SCA/SCAI/SIR/STS/SVM Guidelines for the Diagnosis and Management of Patients with Thoracic Aortic Disease. Circulation. 2010; 121: J194-R740. Aortic aneurysm NOS (ICD10-I71.9). Aortic Atherosclerosis (ICD10-I70.0). Electronically Signed   By: Marijo Conception M.D.   On: 02/26/2022 08:57   DG Chest Port 1 View  Result Date: 02/14/2022 CLINICAL DATA:  Chest pain with shortness of breath. Read sputum and vomiting. EXAM: PORTABLE CHEST 1 VIEW COMPARISON:  12/30/2021 FINDINGS: Cardiomegaly. Small pleural  effusions and diffuse interstitial coarsening with indistinct airspace type density at the bases. Distorted aortic and hilar contours from rotation. IMPRESSION: 1. Interstitial coarsening with indistinct density at the bases favoring infection or aspiration over CHF. 2. Cardiomegaly with small pleural effusions. Electronically Signed   By: Jorje Guild M.D.   On: 02/10/2022 07:14    Radiological Exams on Admission: CT Chest Wo Contrast  Result Date: 03/02/2022 CLINICAL DATA:  Chest pain, shortness of breath. EXAM: CT CHEST WITHOUT CONTRAST TECHNIQUE: Multidetector CT imaging of the chest was performed following the standard protocol without IV contrast. RADIATION DOSE REDUCTION: This exam was performed according to the departmental dose-optimization program which includes automated exposure control, adjustment of the mA and/or kV according to patient size and/or use of iterative reconstruction technique. COMPARISON:  November 23, 2013. FINDINGS: Cardiovascular: Atherosclerosis of thoracic aorta is noted. 4.3 cm ascending thoracic aortic aneurysm is noted. Mild cardiomegaly is noted. No pericardial effusion is noted. Mediastinum/Nodes: No enlarged mediastinal or axillary lymph nodes. Thyroid gland, trachea, and esophagus demonstrate no significant findings. Lungs/Pleura: Small bilateral pleural effusions are noted. No pneumothorax is noted. Multiple patchy airspace opacities are noted in both lungs, right greater than left, concerning for multifocal pneumonia. Upper Abdomen: No acute abnormality. Musculoskeletal: No chest wall mass or suspicious bone lesions identified. IMPRESSION: Multiple patchy airspace opacities are noted in both lungs, right greater than  left, consistent with multifocal pneumonia. Small bilateral pleural effusions are noted. 4.3 cm ascending thoracic aortic aneurysm. Recommend annual imaging followup by CTA or MRA. This recommendation follows 2010  ACCF/AHA/AATS/ACR/ASA/SCA/SCAI/SIR/STS/SVM Guidelines for the Diagnosis and Management of Patients with Thoracic Aortic Disease. Circulation. 2010; 121: G992-E268. Aortic aneurysm NOS (ICD10-I71.9). Aortic Atherosclerosis (ICD10-I70.0). Electronically Signed   By: Marijo Conception M.D.   On: 02/23/2022 08:57   DG Chest Port 1 View  Result Date: 03/06/2022 CLINICAL DATA:  Chest pain with shortness of breath. Read sputum and vomiting. EXAM: PORTABLE CHEST 1 VIEW COMPARISON:  12/30/2021 FINDINGS: Cardiomegaly. Small pleural effusions and diffuse interstitial coarsening with indistinct airspace type density at the bases. Distorted aortic and hilar contours from rotation. IMPRESSION: 1. Interstitial coarsening with indistinct density at the bases favoring infection or aspiration over CHF. 2. Cardiomegaly with small pleural effusions. Electronically Signed   By: Jorje Guild M.D.   On: 03/06/2022 07:14    DVT Prophylaxis -SCD /coumadin AM Labs Ordered, also please review Full Orders  Family Communication: Admission, patients condition and plan of care including tests being ordered have been discussed with the patient and daughter Ms Adah Perl who indicate understanding and agree with the plan   Code Status - Full Code  Likely DC to home with [facility] after improvement in respiratory status  Condition   fair  Roxan Hockey M.D on 02/22/2022 at 6:27 PM Go to [email or web address] -  for contact info  [facility] - Office  [phone]

## 2022-02-17 NOTE — ED Notes (Signed)
Pt is dressed out and put into a gown. Purewick is put in place.  ?

## 2022-02-17 NOTE — ED Notes (Signed)
Hospitalist @ bedside for low O2 ?

## 2022-02-17 NOTE — ED Triage Notes (Signed)
Pt arrived [facility] from home with complaints of SOB and Chest pain that started around 2am this morning. EMS states that she has had red sputum and episodes of vomiting. Pt Afib and RVR on arrival. '4mg'$  of Zofran given IV before arriving. ?

## 2022-02-17 NOTE — ED Notes (Signed)
Pt's daughter at bedside requests if pt has to be admitted that she be admitted to [facility] in [place] because her Cardiologist is there.  ?

## 2022-02-17 NOTE — ED Provider Notes (Signed)
[facility] EMERGENCY DEPARTMENT Provider Note   CSN: [id number] Arrival date & time: 03/01/2022  0630     History  Chief Complaint  Patient presents with   Chest Pain    [name] is a 88 y.o. female.  Pt is a 88 yo female who lives at home with her daughter.  She has a hx of permanent afib, cad, gerd, dvt, diverticulitis, AAA, ra, and frequent UTI.  She woke up this am around 0200 and felt that her heart was going fast.  She has had some coughing with reddish sputum.  EMS said her O2 sat was 89%, so she was put on 2L oxygen and O2 sat now in the mid-90s.  She was in afib with rvr when EMS arrived.  She was given zofran for nausea.  Pt denies feeling nauseous now.  Pt is on coumadin for afib + DVTs.  Last inr 1.8 on 3/6.      Home Medications Prior to Admission medications   Medication Sig Start Date End Date Taking? Authorizing Provider  acetaminophen (TYLENOL) 500 MG tablet Take 500 mg by mouth every 6 (six) hours as needed for mild pain or moderate pain.   Yes [provider]  atorvastatin (LIPITOR) 10 MG tablet TAKE 1 TABLET BY MOUTH EVERY DAY 12/30/21  Yes Gottschalk, Ashly M, DO  calcium carbonate (OS-CAL - DOSED IN MG OF ELEMENTAL CALCIUM) 1250 MG tablet Take 1 tablet by mouth every morning.    Yes [provider]  cholecalciferol (VITAMIN D) 1000 UNITS tablet Take 1,000 Units by mouth daily.   Yes [provider]  DILT-XR 240 MG 24 hr capsule TAKE 1 CAPSULE BY MOUTH EVERY MORNING ON AN EMPTY STOMACH 01/28/22  Yes Gottschalk, Ashly M, DO  furosemide (LASIX) 20 MG tablet TAKE ONE TO TWO TABLETS EVERY DAY AS INSTRUCTED 02/13/22  Yes Gottschalk, Ashly M, DO  loratadine (CLARITIN) 10 MG tablet TAKE 1 TABLET (10 MG TOTAL) BY MOUTH DAILY AS NEEDED FOR ALLERGIES OR ITCHING. 11/16/21  Yes Gottschalk, Ashly M, DO  Multiple Vitamins-Minerals (PRESERVISION AREDS 2+MULTI VIT PO) Take by mouth.   Yes [provider]  olopatadine (PATANOL) 0.1 %  ophthalmic solution PLACE 1 DROP IN AFFECTED EYE(S) TWICE DAILY Patient taking differently: Place 1 drop into both eyes 2 (two) times daily. 06/26/21  Yes Gottschalk, Leatrice Jewels M, DO  Omega-3 Fatty Acids (FISH OIL) 1000 MG CAPS Take 1,000 mg by mouth daily.   Yes [provider]  omeprazole (PRILOSEC) 20 MG capsule TAKE 1 CAPSULE BY MOUTH EVERY DAY 01/28/22  Yes Gottschalk, Ashly M, DO  ondansetron (ZOFRAN-ODT) 4 MG disintegrating tablet TAKE 1 TABLET BY MOUTH EVERY 8 HOURS AS NEEDED FOR NAUSEA 01/18/22  Yes Gottschalk, Ashly M, DO  Vibegron (GEMTESA) 75 MG TABS Take 1 tablet by mouth daily. 03/13/21  Yes Irine Seal, MD  warfarin (COUMADIN) 3 MG tablet TAKE 1 TABLET BY MOUTH EVERY DAY EXCEPT TAKE 2 ON MONDAY, WEDNESDAY, FRIDAY, AND SATURDAY 01/04/22  Yes Gottschalk, Ashly M, DO  baclofen (LIORESAL) 10 MG tablet Take 1 tablet (10 mg total) by mouth 3 (three) times daily. Patient not taking: Reported on 02/14/2022 10/26/21   Gwenlyn Perking, FNP  mirtazapine (REMERON) 7.5 MG tablet TAKE 1 TABLET BY MOUTH EVERYDAY AT BEDTIME Patient not taking: Reported on 03/11/2022 11/16/21   Janora Norlander, DO  Misc. Devices ([place]) MISC UAD 04/27/21   Janora Norlander, DO  PROAIR HFA 108 [phone] Base) MCG/ACT  inhaler TAKE 2 PUFFS BY MOUTH EVERY 6 HOURS AS NEEDED FOR WHEEZE OR SHORTNESS OF BREATH Patient not taking: Reported on 02/11/2022 11/17/18   Janora Norlander, DO      Allergies    Contrast media [iodinated contrast media], Amoxicillin, Ampicillin, Cefdinir, Cortisol [hydrocortisone], Ivp dye [iodinated contrast media], Levaquin [levofloxacin], Penicillins, and Vancomycin    Review of Systems   Review of Systems  Respiratory:  Positive for cough and shortness of breath.   Cardiovascular:  Positive for palpitations.  All other systems reviewed and are negative.  Physical Exam Updated Vital Signs BP 127/81    Pulse 71    Resp (!) 27    Ht '5\' 5"'$  (1.651 m)    Wt 63.5 kg    SpO2 (!) 89%     BMI 23.30 kg/m  Physical Exam Vitals and nursing note reviewed.  Constitutional:      General: She is in acute distress.     Appearance: She is well-developed.  HENT:     Head: Normocephalic and atraumatic.  Eyes:     Extraocular Movements: Extraocular movements intact.     Pupils: Pupils are equal, round, and reactive to light.  Cardiovascular:     Rate and Rhythm: Tachycardia present. Rhythm irregular.     Heart sounds: Normal heart sounds.  Pulmonary:     Effort: Pulmonary effort is normal.     Breath sounds: Normal breath sounds.  Abdominal:     General: Bowel sounds are normal.     Palpations: Abdomen is soft.  Musculoskeletal:        General: Normal range of motion.     Cervical back: Normal range of motion and neck supple.  Skin:    General: Skin is warm.     Capillary Refill: Capillary refill takes less than 2 seconds.  Neurological:     General: No focal deficit present.     Mental Status: She is alert and oriented to person, place, and time.  Psychiatric:        Mood and Affect: Mood normal.        Behavior: Behavior normal.    ED Results / Procedures / Treatments   Labs (all labs ordered are listed, but only abnormal results are displayed) Labs Reviewed  COMPREHENSIVE METABOLIC PANEL - Abnormal; Notable for the following components:      Result Value   Sodium 131 (*)    Chloride 95 (*)    Glucose, Bld 135 (*)    Calcium 8.7 (*)    All other components within normal limits  LIPASE, BLOOD - Abnormal; Notable for the following components:   Lipase 69 (*)    All other components within normal limits  BRAIN NATRIURETIC PEPTIDE - Abnormal; Notable for the following components:   B Natriuretic Peptide 396.0 (*)    All other components within normal limits  PROTIME-INR - Abnormal; Notable for the following components:   Prothrombin Time 22.4 (*)    INR 2.0 (*)    All other components within normal limits  TROPONIN I (HIGH SENSITIVITY) - Abnormal; Notable for  the following components:   Troponin I (High Sensitivity) 18 (*)    All other components within normal limits  TROPONIN I (HIGH SENSITIVITY) - Abnormal; Notable for the following components:   Troponin I (High Sensitivity) 18 (*)    All other components within normal limits  CULTURE, BLOOD (ROUTINE X 2)  CULTURE, BLOOD (ROUTINE X 2)  RESP PANEL BY RT-PCR (FLU A&B,  COVID) ARPGX2  CBC WITH DIFFERENTIAL/PLATELET  LACTIC ACID, PLASMA  URINALYSIS, ROUTINE W REFLEX MICROSCOPIC  TSH  MAGNESIUM    EKG EKG Interpretation  Date/Time:  Wednesday February 17 2022 06:39:56 EST Ventricular Rate:  143 PR Interval:    QRS Duration: 61 QT Interval:  320 QTC Calculation: 471 R Axis:   -1 Text Interpretation: Atrial fibrillation with rapid V-rate Anteroseptal infarct, age indeterminate Confirmed by Orpah Greek [phone]) on 02/27/2022 6:44:27 AM  Radiology CT Chest Wo Contrast  Result Date: 03/06/2022 CLINICAL DATA:  Chest pain, shortness of breath. EXAM: CT CHEST WITHOUT CONTRAST TECHNIQUE: Multidetector CT imaging of the chest was performed following the standard protocol without IV contrast. RADIATION DOSE REDUCTION: This exam was performed according to the departmental dose-optimization program which includes automated exposure control, adjustment of the mA and/or kV according to patient size and/or use of iterative reconstruction technique. COMPARISON:  November 23, 2013. FINDINGS: Cardiovascular: Atherosclerosis of thoracic aorta is noted. 4.3 cm ascending thoracic aortic aneurysm is noted. Mild cardiomegaly is noted. No pericardial effusion is noted. Mediastinum/Nodes: No enlarged mediastinal or axillary lymph nodes. Thyroid gland, trachea, and esophagus demonstrate no significant findings. Lungs/Pleura: Small bilateral pleural effusions are noted. No pneumothorax is noted. Multiple patchy airspace opacities are noted in both lungs, right greater than left, concerning for multifocal pneumonia.  Upper Abdomen: No acute abnormality. Musculoskeletal: No chest wall mass or suspicious bone lesions identified. IMPRESSION: Multiple patchy airspace opacities are noted in both lungs, right greater than left, consistent with multifocal pneumonia. Small bilateral pleural effusions are noted. 4.3 cm ascending thoracic aortic aneurysm. Recommend annual imaging followup by CTA or MRA. This recommendation follows 2010 ACCF/AHA/AATS/ACR/ASA/SCA/SCAI/SIR/STS/SVM Guidelines for the Diagnosis and Management of Patients with Thoracic Aortic Disease. Circulation. 2010; 121: U045-W098. Aortic aneurysm NOS (ICD10-I71.9). Aortic Atherosclerosis (ICD10-I70.0). Electronically Signed   By: Marijo Conception M.D.   On: 03/04/2022 08:57   DG Chest Port 1 View  Result Date: 02/18/2022 CLINICAL DATA:  Chest pain with shortness of breath. Read sputum and vomiting. EXAM: PORTABLE CHEST 1 VIEW COMPARISON:  12/30/2021 FINDINGS: Cardiomegaly. Small pleural effusions and diffuse interstitial coarsening with indistinct airspace type density at the bases. Distorted aortic and hilar contours from rotation. IMPRESSION: 1. Interstitial coarsening with indistinct density at the bases favoring infection or aspiration over CHF. 2. Cardiomegaly with small pleural effusions. Electronically Signed   By: Jorje Guild M.D.   On: 03/09/2022 07:14    Procedures Procedures    Medications Ordered in ED Medications  diltiazem (CARDIZEM) 1 mg/mL load via infusion 20 mg (20 mg Intravenous Bolus from Bag 02/16/2022 0741)    And  diltiazem (CARDIZEM) 125 mg in dextrose 5% 125 mL (1 mg/mL) infusion (12.5 mg/hr Intravenous Rate/Dose Change 03/09/2022 0930)  cefTRIAXone (ROCEPHIN) 1 g in sodium chloride 0.9 % 100 mL IVPB (has no administration in time range)  azithromycin (ZITHROMAX) 500 mg in sodium chloride 0.9 % 250 mL IVPB (has no administration in time range)  dextromethorphan-guaiFENesin (MUCINEX DM) 30-600 MG per 12 hr tablet 1 tablet (has no  administration in time range)    ED Course/ Medical Decision Making/ A&P                           Medical Decision Making Amount and/or Complexity of Data Reviewed Labs: ordered. Radiology: ordered.  Risk Prescription drug management. Decision regarding hospitalization.   This patient presents to the ED for concern of sob, this involves an  extensive number of treatment options, and is a complaint that carries with it a high risk of complications and morbidity.  The differential diagnosis includes pna, chf, afib with rvr, electrolyte imbalance   Co morbidities that complicate the patient evaluation  permanent afib, cad, gerd, dvt, diverticulitis, AAA, ra, and frequent UTI   Additional history obtained:  Additional history obtained from epic chart review External records from outside source obtained and reviewed including daughter   Lab Tests:  I Ordered, and personally interpreted labs.  The pertinent results include:  cbc is nl, cmp is nl other than mild hyponatremia, inr is 2.0, bnp is 396, trop mildly elevated at 18.  Covid/flu neg.   Imaging Studies ordered:  I ordered imaging studies including cxr  I independently visualized and interpreted imaging which showed    IMPRESSION:  1. Interstitial coarsening with indistinct density at the bases  favoring infection or aspiration over CHF.  2. Cardiomegaly with small pleural effusions.  CT chest:   IMPRESSION:  Multiple patchy airspace opacities are noted in both lungs, right  greater than left, consistent with multifocal pneumonia.     Small bilateral pleural effusions are noted.     4.3 cm ascending thoracic aortic aneurysm. Recommend annual imaging  followup by CTA or MRA. This recommendation follows 2010  ACCF/AHA/AATS/ACR/ASA/SCA/SCAI/SIR/STS/SVM Guidelines for the  Diagnosis and Management of Patients with Thoracic Aortic Disease.  Circulation. 2010; 121: E174-Y814. Aortic aneurysm NOS  (ICD10-I71.9).      Aortic Atherosclerosis (ICD10-I70.0).   I agree with the radiologist interpretation   Cardiac Monitoring:  The patient was maintained on a cardiac monitor.  I personally viewed and interpreted the cardiac monitored which showed an underlying rhythm of: afib with rvr   Medicines ordered and prescription drug management:  I ordered medication including cardizem bolus and infusion  for afib with rvr  and abx for pna Reevaluation of the patient after these medicines showed that the patient improved I have reviewed the patients home medicines and have made adjustments as needed   Test Considered:  CtA chest:  However, pt has an IV dye allergy   Critical Interventions:  Cardizem and abx   Consultations Obtained:  I requested consultation with the hospitalist,  and discussed lab and imaging findings as well as pertinent plan - they will admit   Problem List / ED Course:  Afib with rvr:  pt on a cardizem drip, but hr is still elevated in the 120. Multifocal pneumonia:  Daughters say pt can take rocephin and zithromax, so those abx have been ordered   Reevaluation:  After the interventions noted above, I reevaluated the patient and found that they have :improved   Social Determinants of Health:  Lives at home with daughter   Dispostion:  After consideration of the diagnostic results and the patients response to treatment, I feel that the patent would benefit from admission.    CRITICAL CARE Performed by: Isla Pence   Total critical care time: 30 minutes  Critical care time was exclusive of separately billable procedures and treating other patients.  Critical care was necessary to treat or prevent imminent or life-threatening deterioration.  Critical care was time spent personally by me on the following activities: development of treatment plan with patient and/or surrogate as well as nursing, discussions with consultants, evaluation of patient's response to  treatment, examination of patient, obtaining history from patient or surrogate, ordering and performing treatments and interventions, ordering and review of laboratory studies, ordering and review of  radiographic studies, pulse oximetry and re-evaluation of patient's condition.         Final Clinical Impression(s) / ED Diagnoses Final diagnoses:  Multifocal pneumonia  Atrial fibrillation with RVR ([place])  Acute respiratory failure with hypoxia [facility])    Rx / DC Orders ED Discharge Orders     None         Isla Pence, MD 02/16/2022 0945

## 2022-02-17 NOTE — Consult Note (Signed)
? ?                                                                                ?Consultation Note ?Date: 02/11/2022  ? ?Patient Name: [name]  ?DOB: 18-Jan-1921  MRN: [id number]  Age / Sex: 88 y.o., female  ?PCP: Janora Norlander, DO ?Referring Physician: Roxan Hockey, MD ? ?Reason for Consultation: goals of care ? ?HPI/Patient Profile: 88 y.o. female  with past medical history of chronic a-fib on coumadin, DVT,  admitted on 02/24/2022 with aspiration pnuemonia and a-fib with RVR. Palliative medicine consutled for goals of care.  ? ?Primary Decision Maker ?HCPOA- daughter- Vashti Hey ? ?Discussion: ?I have reviewed medical records including EPIC notes, labs and imaging, received report from patient's nurse, assessed the patient and then met with her daughter/HCPOA  to discuss diagnosis prognosis, GOC, EOL wishes, disposition and options. ? ?Evaluated patient- she was agitated, confused, and unable to participate in [place] discussion. Appeared uncomfortable and SOB, having some air hunger.  ? ?I introduced Palliative Medicine as specialized medical care for people living with serious illness. It focuses on providing relief from the symptoms and stress of a serious illness. The goal is to improve quality of life for both the patient and the family. ? ?We discussed a brief life review of the patient. She lives at home with [name]. Her husband died 98 years ago.  ? ?As far as functional and nutritional status - prior to this admission she has been living independently in the home- completes her own ADL's feeds herself. She has had some problems with dysphagia and typically knows to tuck her chin when she swallows.  ? ? We discussed patient's current illness and what it means in the larger context of patient's on-going co-morbidities.  Natural disease trajectory and expectations at EOL were discussed. ? ?I attempted to elicit values and goals of care important to the patient.  For now [name] shares  that goals of care are curative and to return patient home.  ? ?The difference between aggressive medical intervention and comfort care was considered in light of the patient's goals of care.  ? ?Advance directives, concepts specific to code status, artificial feeding and hydration, and rehospitalization were considered and discussed. Per [name] patient has living will stating no artificial life supporting means.  ? ?CODE STATUS was discussed- [name] shares that she has had extensive discussions with her Mom about this and her Mom wants chest compressions only and never to be intubated. We discussed the unlikely positive outcomes for Dot if she were to require resuscitation with or without intubation. However, [name] wishes to honor her mom's wishes for now.  ? ?We also discuss symptom management to limit suffering in the presence of ongoing curative intent care- recommend morphine IV 1-78m prn for agitation, air hunger- DJackelyn Polingis in agreement with this plan.  ? ?Discussed with patient/family the importance of continued conversation with family and the medical providers regarding overall plan of care and treatment options, ensuring decisions are within the context of the patient?s values and GOCs.    ? ?SUMMARY OF RECOMMENDATIONS ?-FPilar Platerecommendation for full DNR given patient's desire not to be intubated  and the futility of chest compression only efforts at resuscitation in the event of cardiac and respiratory arrest- will continue to discuss-  ?-Continue treatment aimed at treating what is treatable ?-Patient with continued agitation s/p receiving IV lorazepam- ordered morphine 1-25m IV q4hr prn agitation/air hunger- would not recommend more lorazepam- patient appeared more comfortable after receiving low dose morphine ? ?Code Status/Advance Care Planning: ?Limited code- chest compressions only ? ? ?Prognosis:   ?Unable to determine ? ?Discharge Planning: To Be Determined ? ?Primary Diagnoses: ?Present on  Admission: ? Atrial fibrillation with RVR ([facility] ? [place] respiratory [place] with [facility] [place] ? ? ?Review of Systems ? ?Physical Exam ? ?Vital Signs: BP 136/62   Pulse (!) 112   Temp 98 ?F (36.7 ?C) (Oral)   Resp (!) 25   Ht _0  (1.651 m)   Wt 70.3 kg   SpO2 97%   BMI 25.79 kg/m?  ?Pain Scale: 0-10 ?POSS *See Group Information*: 1-Acceptable,Awake and alert ?Pain Score: 3  ? ? ?SpO2: SpO2: 97 % ?O2 Device:SpO2: 97 % ?O2 Flow Rate: .O2 Flow Rate (L/min): 12 L/min ? ?IO: Intake/output summary:  ?Intake/Output Summary (Last 24 hours) at 02/20/2022 1746 ?Last data filed at 02/24/2022 1349 ?Gross per 24 hour  ?Intake 444.56 ml  ?Output --  ?Net 444.56 ml  ? ? ?LBM: Last BM Date : 02/16/22 ?Baseline Weight: Weight: 63.5 kg ?Most recent weight: Weight: 70.3 kg     ? ? ? ?Thank you for this consult. Palliative medicine will continue to follow and assist as needed. ? ? ?Signed by: ?KMariana Kaufman AGNP-C ?Palliative Medicine ? ?  ?Please contact Palliative Medicine Team phone at [phone] questions and concerns.  ?For individual provider: See Amion ? ? ? ? ? ? ? ? ? ? ? ? ? ? ?

## 2022-02-17 NOTE — Progress Notes (Addendum)
ANTICOAGULATION CONSULT NOTE - Initial Consult ? ?Pharmacy Consult for wartfarin dosing. ?Indication: atrial fibrillation ? ?Allergies  ?Allergen Reactions  ? Contrast Media [Iodinated Contrast Media] Hives  ? Amoxicillin Itching  ? Ampicillin Itching  ? Cefdinir Nausea And Vomiting  ? Cortisol [Hydrocortisone] Itching  ? Ivp Dye [Iodinated Contrast Media] Hives  ? Levaquin [Levofloxacin] Itching and Rash  ? Penicillins Rash  ?  "haven't had it in years"  ? Vancomycin Itching and Rash  ? ? ?Patient Measurements: ?Height: '5\' 5"'$  (165.1 cm) ?Weight: 70.3 kg (154 lb 15.7 oz) ?IBW/kg (Calculated) : 57 ? ?Vital Signs: ?Temp: 98 ?F (36.7 ?C) (03/08 1700) ?Temp Source: Oral (03/08 1700) ?BP: 137/46 (03/08 1800) ?Pulse Rate: 89 (03/08 1800) ? ?Labs: ?Recent Labs  ?  02/15/22 ?1248 03/09/2022 ?0646 03/10/2022 ?0656 03/03/2022 ?0853  ?HGB  --  13.0  --   --   ?HCT  --  38.9  --   --   ?PLT  --  172  --   --   ?LABPROT  --   --  22.4*  --   ?INR 1.8*  --  2.0*  --   ?CREATININE  --  0.69  --   --   ?TROPONINIHS  --  18*  --  18*  ? ? ?Estimated Creatinine Clearance: 38.6 mL/min (by C-G formula based on SCr of 0.69 mg/dL). ? ? ?Medical History: ?Past Medical History:  ?Diagnosis Date  ? AAA (abdominal aortic aneurysm)   ? Allergy   ? Anxiety   ? Bladder cancer ([place]) dx'd 2011  ? Chronic microscopic hematuria; transitional cell cancer  ? Blood transfusion 1960  ? "related to hysterectomy"  ? Chronic atrial fibrillation (H[facility])   ? Anticoagulated with warfarin, rate control with diltiazem and Toprol  ? Chronic back pain   ? Chronic headache   ? "probably 2/wk" (08/28/2015)  ? Concussion w/o coma 03/31/2004  ? Complicated by subarachnoid hemorrhage.  "even now has times when she's not able to comprehend" (05/08/12)  ? Coronary artery disease   ? Nonobstructive  ? Diverticulitis   ? Diverticulosis   ? DVT (deep venous thrombosis), right 2005  ? "right calf after 8 foot fall"  ? Edema of both legs   ? Chronic, thought to be secondary to DVTs  ?  External hemorrhoids   ? Family history of adverse reaction to anesthesia 2012  ? daughter "had OR for crushed hand; had problems w/anesthesia & I was in there all day long" (  ? Fatty liver   ? Frequent UTI   ? GERD (gastroesophageal reflux disease)   ? H/O hiatal hernia   ? High cholesterol   ? Migraine   ? "rare now" (08/28/2015)  ? Pneumonia ~ 2010;  2013; 07/2015  ? Rheumatoid arthritis(714.0)   ? "hands" (08/28/2015)  ? Urinary frequency   ? ? ?Medications that can interact with warfarin:  ?Azithromycin  ?Fluconazole ? ?Assessment: ?Patient is on warfarin at a home dose of 3 mg T,Th,Su and 6 mg M,W,F,Sa for chronic a.fib and hx DVT. Her INR today = 2.0 ? ?Goal of Therapy:  ? ?INR = 2-3 ?Monitor platelets by anticoagulation protocol: Yes ?  ?Plan:  ?Continue home dose warfarin since pt is therapeutic.  ?Pt unable to take po until swallow eval. so dose will be resumed tomorrow. ?Will monitor INR, CBC, and s/sx bleeding. ?Will try to avoid medications that interact with warfarin. ? ?Blenda Nicely ?03/05/2022,7:18 PM ? ? ?

## 2022-02-17 NOTE — Progress Notes (Signed)
RN called earlier informing this RT that patient had a desat episode despite being on [place] HFNC at [place].  RN turned up O2 to 15L and patient was still in 70s on sat with fairly good pleth.  Asked RN to place a NRB on patient as well to get sats up.  Patient is now on NRB and [place].  Sats came up so turned down [place] to 12L and she still has on NRB.  Current sat is 96%. ?

## 2022-02-18 ENCOUNTER — Inpatient Hospital Stay (HOSPITAL_COMMUNITY): Payer: PPO

## 2022-02-18 DIAGNOSIS — J9601 Acute respiratory failure with hypoxia: Secondary | ICD-10-CM | POA: Diagnosis not present

## 2022-02-18 DIAGNOSIS — R9431 Abnormal electrocardiogram [ECG] [EKG]: Secondary | ICD-10-CM

## 2022-02-18 DIAGNOSIS — J189 Pneumonia, unspecified organism: Secondary | ICD-10-CM | POA: Diagnosis present

## 2022-02-18 LAB — CBC
HCT: 33.6 % — ABNORMAL LOW (ref 36.0–46.0)
Hemoglobin: 11.1 g/dL — ABNORMAL LOW (ref 12.0–15.0)
MCH: 32 pg (ref 26.0–34.0)
MCHC: 33 g/dL (ref 30.0–36.0)
MCV: 96.8 fL (ref 80.0–100.0)
Platelets: 141 10*3/uL — ABNORMAL LOW (ref 150–400)
RBC: 3.47 MIL/uL — ABNORMAL LOW (ref 3.87–5.11)
RDW: 13.7 % (ref 11.5–15.5)
WBC: 10.1 10*3/uL (ref 4.0–10.5)
nRBC: 0 % (ref 0.0–0.2)

## 2022-02-18 LAB — ECHOCARDIOGRAM COMPLETE
AR max vel: 1.09 cm2
AV Area VTI: 0.98 cm2
AV Area mean vel: 1.17 cm2
AV Mean grad: 12.8 mmHg
AV Peak grad: 28.4 mmHg
Ao pk vel: 2.66 m/s
Area-P 1/2: 6.9 cm2
Height: 65 in
MV VTI: 1.22 cm2
S' Lateral: 3.3 cm
Weight: 2571.45 oz

## 2022-02-18 LAB — BASIC METABOLIC PANEL
Anion gap: 8 (ref 5–15)
BUN: 20 mg/dL (ref 8–23)
CO2: 24 mmol/L (ref 22–32)
Calcium: 8.2 mg/dL — ABNORMAL LOW (ref 8.9–10.3)
Chloride: 93 mmol/L — ABNORMAL LOW (ref 98–111)
Creatinine, Ser: 0.82 mg/dL (ref 0.44–1.00)
GFR, Estimated: >60 mL/min (ref >60)
Glucose, Bld: 117 mg/dL — ABNORMAL HIGH (ref 70–99)
Potassium: 4.5 mmol/L (ref 3.5–5.1)
Sodium: 125 mmol/L — ABNORMAL LOW (ref 135–145)

## 2022-02-18 LAB — STREP PNEUMONIAE URINARY ANTIGEN: Strep Pneumo Urinary Antigen: NEGATIVE

## 2022-02-18 LAB — GLUCOSE, CAPILLARY
Glucose-Capillary: 108 mg/dL — ABNORMAL HIGH (ref 70–99)
Glucose-Capillary: 133 mg/dL — ABNORMAL HIGH (ref 70–99)
Glucose-Capillary: 149 mg/dL — ABNORMAL HIGH (ref 70–99)
Glucose-Capillary: 154 mg/dL — ABNORMAL HIGH (ref 70–99)

## 2022-02-18 LAB — PROTIME-INR
INR: 2.3 — ABNORMAL HIGH (ref 0.8–1.2)
Prothrombin Time: 25.3 seconds — ABNORMAL HIGH (ref 11.4–15.2)

## 2022-02-18 LAB — MRSA NEXT GEN BY PCR, NASAL: MRSA by PCR Next Gen: NOT DETECTED

## 2022-02-18 MED ORDER — LEVALBUTEROL HCL 0.63 MG/3ML IN NEBU
0.6300 mg | INHALATION_SOLUTION | Freq: Three times a day (TID) | RESPIRATORY_TRACT | Status: DC
  Administered 2022-02-19 (×2): 0.63 mg via RESPIRATORY_TRACT
  Filled 2022-02-18 (×4): qty 3

## 2022-02-18 MED ORDER — SODIUM CHLORIDE 0.9 % IV SOLN
INTRAVENOUS | Status: DC
Start: 2022-02-18 — End: 2022-02-18

## 2022-02-18 MED ORDER — SODIUM CHLORIDE 0.9 % IV SOLN
INTRAVENOUS | Status: AC
Start: 2022-02-18 — End: 2022-02-19

## 2022-02-18 MED ORDER — SODIUM CHLORIDE 0.9 % IV SOLN
100.0000 mg | Freq: Two times a day (BID) | INTRAVENOUS | Status: DC
  Administered 2022-02-18 – 2022-02-21 (×7): 100 mg via INTRAVENOUS
  Filled 2022-02-18 (×9): qty 100

## 2022-02-18 MED ORDER — ENSURE ENLIVE PO LIQD
237.0000 mL | Freq: Three times a day (TID) | ORAL | Status: DC
  Administered 2022-02-18 – 2022-02-19 (×4): 237 mL via ORAL

## 2022-02-18 MED ORDER — WARFARIN SODIUM 2 MG PO TABS
3.0000 mg | ORAL_TABLET | Freq: Once | ORAL | Status: AC
  Administered 2022-02-18: 16:00:00 3 mg via ORAL
  Filled 2022-02-18: qty 1

## 2022-02-18 NOTE — Consult Note (Signed)
Name: [name] MRN: [id number] DOB: [birth year]    ADMISSION DATE:  02/26/2022 CONSULTATION DATE:  02/18/2022   REFERRING MD :  Roxan Hockey TRH  CHIEF COMPLAINT: Respiratory distress requiring oxygen   HISTORY OF PRESENT ILLNESS: 88 year old admitted with multifocal pneumonia and acute respiratory failure requiring high flow oxygen for which PCCM is consulted She was brought in by EMS with complaints of shortness of breath and chest pain that started around 2 AM on 3/8.  She reported episodes of vomiting and red sputum, she was found to be in atrial fibrillation with RVR requiring IV Cardizem drip, UA was suggestive of UTI, INR was 2.0, troponin was low, BNP 396 , serum sodium of 131 Chest x-ray showed cardiomegaly with small effusions and diffuse interstitial infiltrates. CT chest without contrast showed multiple patchy areas of consolidation in both lungs right more than left with small bilateral effusions 4.3 cm ascending thoracic aortic aneurysm was noted  -She was treated with Rocephin and azithromycin , admitted to the ICU initially requiring nonrebreather has now been weaned to 11 L high flow nasal cannula  PAST MEDICAL HISTORY :   Atrial fibrillation on Coumadin, prior history of DVT Ex-smoker, quit 1983 , 40 pack years  Past Medical History:  Diagnosis Date   AAA (abdominal aortic aneurysm)    Allergy    Anxiety    Bladder cancer ([place]) dx'd 2011   Chronic microscopic hematuria; transitional cell cancer   Blood transfusion 1960   "related to hysterectomy"   Chronic atrial fibrillation ([place])    Anticoagulated with warfarin, rate control with diltiazem and Toprol   Chronic back pain    Chronic headache    "probably 2/wk" (08/28/2015)   Concussion w/o coma 0/96/2836   Complicated by subarachnoid hemorrhage.  "even now has times when she's not able to comprehend" (05/08/12)   Coronary artery disease    Nonobstructive   Diverticulitis    Diverticulosis     DVT (deep venous thrombosis), right 2005   "right calf after 8 foot fall"   Edema of both legs    Chronic, thought to be secondary to DVTs   External hemorrhoids    Family history of adverse reaction to anesthesia 2012   daughter "had OR for crushed hand; had problems w/anesthesia & I was in there all day long" (   Fatty liver    Frequent UTI    GERD (gastroesophageal reflux disease)    H/O hiatal hernia    High cholesterol    Migraine    "rare now" (08/28/2015)   Pneumonia ~ 2010;  2013; 07/2015   Rheumatoid arthritis(714.0)    "hands" (08/28/2015)   Urinary frequency    Past Surgical History:  Procedure Laterality Date   BREAST CYST EXCISION Left 1960's?   2 cysts; benign   CARDIAC CATHETERIZATION  1980's   CATARACT EXTRACTION W/ INTRAOCULAR LENS  IMPLANT, BILATERAL Bilateral 1992   CYSTOSCOPY  11/11/2011   Procedure: CYSTOSCOPY;  Surgeon: Malka So;  Location: [facility];  Service: Urology;  Laterality: N/A;   CYSTOSTOMY W/ BLADDER BIOPSY  10/08/2013   INCONTINENCE SURGERY  1980's   JOINT REPLACEMENT     KNEE ARTHROSCOPY Right 2005   S/P fall   Lower Extremity Venous Doppler  11/10/2013   No DVT or superficial thrombus enlarged inguinal lymph node noted in the right. No Baker's cyst.   TONSILLECTOMY  1952   TOTAL KNEE ARTHROPLASTY Right 02/05/2013   Procedure: TOTAL KNEE ARTHROPLASTY;  Surgeon:  Mauri Pole, MD;  Location: [facility];  Service: Orthopedics;  Laterality: Right;   TRANSTHORACIC ECHOCARDIOGRAM  01/'15; 4/'19   a) EF 60-65%. NO RWMA. Ao Sclerosis. MAC - no MS with mild MR>  B Atriae mildly dilated;; b)  EF 55-60%. AoV Sclerosis (no stenosis).  Trivial AI & MR. Mild LAE & Severe RAE.. Mild RV dilation   TRANSURETHRAL RESECTION OF BLADDER TUMOR  11/11/2011   Procedure: TRANSURETHRAL RESECTION OF BLADDER TUMOR (TURBT);  Surgeon: Malka So;  Location: [facility];  Service: Urology;  Laterality: N/A;  Cysto, Bladder Biopsy, TURBT with G[place],    TRANSURETHRAL RESECTION OF  BLADDER TUMOR WITH GYRUS (TURBT-GYRUS)  2007; 2009; 2010   "for tumors on surface of bladder"   TUMOR EXCISION  1976; 1980's   "fatty tumor cut off her upper back; left thumb"   [place]   partial    Prior to Admission medications   Medication Sig Start Date End Date Taking? Authorizing Provider  acetaminophen (TYLENOL) 500 MG tablet Take 500 mg by mouth every 6 (six) hours as needed for mild pain or moderate pain.   Yes [provider]  atorvastatin (LIPITOR) 10 MG tablet TAKE 1 TABLET BY MOUTH EVERY DAY 12/30/21  Yes Gottschalk, Ashly M, DO  calcium carbonate (OS-CAL - DOSED IN MG OF ELEMENTAL CALCIUM) 1250 MG tablet Take 1 tablet by mouth every morning.    Yes [provider]  cholecalciferol (VITAMIN D) 1000 UNITS tablet Take 1,000 Units by mouth daily.   Yes [provider]  DILT-XR 240 MG 24 hr capsule TAKE 1 CAPSULE BY MOUTH EVERY MORNING ON AN EMPTY STOMACH 01/28/22  Yes Gottschalk, Ashly M, DO  furosemide (LASIX) 20 MG tablet TAKE ONE TO TWO TABLETS EVERY DAY AS INSTRUCTED 02/13/22  Yes Gottschalk, Ashly M, DO  loratadine (CLARITIN) 10 MG tablet TAKE 1 TABLET (10 MG TOTAL) BY MOUTH DAILY AS NEEDED FOR ALLERGIES OR ITCHING. 11/16/21  Yes Gottschalk, Ashly M, DO  Multiple Vitamins-Minerals (PRESERVISION AREDS 2+MULTI VIT PO) Take by mouth.   Yes [provider]  olopatadine (PATANOL) 0.1 % ophthalmic solution PLACE 1 DROP IN AFFECTED EYE(S) TWICE DAILY Patient taking differently: Place 1 drop into both eyes 2 (two) times daily. 06/26/21  Yes Gottschalk, Leatrice Jewels M, DO  Omega-3 Fatty Acids (FISH OIL) 1000 MG CAPS Take 1,000 mg by mouth daily.   Yes [provider]  omeprazole (PRILOSEC) 20 MG capsule TAKE 1 CAPSULE BY MOUTH EVERY DAY 01/28/22  Yes Gottschalk, Ashly M, DO  ondansetron (ZOFRAN-ODT) 4 MG disintegrating tablet TAKE 1 TABLET BY MOUTH EVERY 8 HOURS AS NEEDED FOR NAUSEA 01/18/22  Yes Gottschalk, Ashly M, DO  Vibegron (GEMTESA)  75 MG TABS Take 1 tablet by mouth daily. 03/13/21  Yes Irine Seal, MD  warfarin (COUMADIN) 3 MG tablet TAKE 1 TABLET BY MOUTH EVERY DAY EXCEPT TAKE 2 ON MONDAY, WEDNESDAY, FRIDAY, AND SATURDAY 01/04/22  Yes Gottschalk, Ashly M, DO  baclofen (LIORESAL) 10 MG tablet Take 1 tablet (10 mg total) by mouth 3 (three) times daily. Patient not taking: Reported on 03/11/2022 10/26/21   Gwenlyn Perking, FNP  mirtazapine (REMERON) 7.5 MG tablet TAKE 1 TABLET BY MOUTH EVERYDAY AT BEDTIME Patient not taking: Reported on 02/11/2022 11/16/21   Janora Norlander, DO  Misc. Devices (TOILET SAFETY FRAME) MISC UAD 04/27/21   Gottschalk, Ashly M, DO  PROAIR HFA 108 (90 Base) MCG/ACT inhaler TAKE 2 PUFFS BY MOUTH EVERY 6 HOURS AS  NEEDED FOR WHEEZE OR SHORTNESS OF BREATH Patient not taking: Reported on 02/18/2022 11/17/18   Janora Norlander, DO   Allergies  Allergen Reactions   Contrast Media [Iodinated Contrast Media] Hives   Amoxicillin Itching   Ampicillin Itching   Cefdinir Nausea And Vomiting   Cortisol [Hydrocortisone] Itching   Ivp Dye [Iodinated Contrast Media] Hives   Levaquin [Levofloxacin] Itching and Rash   Penicillins Rash    "haven't had it in years"   Vancomycin Itching and Rash    FAMILY HISTORY:  Family History  Problem Relation Age of Onset   Anesthesia problems Daughter    Cancer Mother        uterine   Cancer Sister        leukemia   Cancer Brother    Prostate cancer Brother    Cancer Brother    Breast cancer Other        neice   Heart disease Brother    Cancer Brother    Heart attack Sister    Colon cancer Neg Hx    SOCIAL HISTORY:  reports that she quit smoking about 39 years ago. Her smoking use included cigarettes. She has a 40.00 pack-year smoking history. She has quit using smokeless tobacco.  Her smokeless tobacco use included snuff. She reports current alcohol use of about 7.0 standard drinks per week. She reports that she does not use drugs.  REVIEW OF SYSTEMS:    Constitutional: Negative for fever, chills, weight loss, malaise/fatigue and diaphoresis.  HENT: Negative for hearing loss, ear pain, nosebleeds, congestion, sore throat, neck pain, tinnitus and ear discharge.   Eyes: Negative for blurred vision, double vision, photophobia, pain, discharge and redness.  Cardiovascular: Negative for chest pain, palpitations, orthopnea, claudication, leg swelling and PND.  Gastrointestinal: Negative for heartburn, nausea, vomiting, abdominal pain, diarrhea, constipation, blood in stool and melena.  Genitourinary: Negative for dysuria, urgency, frequency, hematuria and flank pain.  Musculoskeletal: Negative for myalgias, back pain, joint pain and falls.  Skin: Negative for itching and rash.  Neurological: Negative for dizziness, tingling, tremors, sensory change, speech change, focal weakness, seizures, loss of consciousness, weakness and headaches.  Endo/Heme/Allergies: Negative for environmental allergies and polydipsia. Does not bruise/bleed easily.  SUBJECTIVE:   VITAL SIGNS: Temp:  [98 F (36.7 C)-98.6 F (37 C)] 98.2 F (36.8 C) (03/09 1152) Pulse Rate:  [41-124] 87 (03/09 1130) Resp:  [21-45] 42 (03/09 1100) BP: (110-160)/(46-89) 128/59 (03/09 1130) SpO2:  [81 %-100 %] 88 % (03/09 1100) Weight:  [70.3 kg-72.9 kg] 72.9 kg (03/09 0442)  PHYSICAL EXAMINATION: Gen. Pleasant, elderly, well-nourished, in mild distress, normal affect ENT - no thrush, no post nasal drip, hard of hearing Neck: No JVD, no thyromegaly, no carotid bruits Lungs: no use of accessory muscles, no dullness to percussion, right basal crackles, no rhonchi Cardiovascular: Rhythm regular, heart sounds  normal, no murmurs, no peripheral edema Abdomen: soft and non-tender, no hepatosplenomegaly, BS normal. Musculoskeletal: No deformities, no cyanosis or clubbing Neuro:  alert, non focal Skin:  Warm, no lesions/ rash   Recent Labs  Lab 02/11/2022 0646 02/18/22 0422  NA 131* 125*   K 4.2 4.5  CL 95* 93*  CO2 26 24  BUN 13 20  CREATININE 0.69 0.82  GLUCOSE 135* 117*   Recent Labs  Lab 03/03/2022 0646 02/18/22 0422  HGB 13.0 11.1*  HCT 38.9 33.6*  WBC 9.6 10.1  PLT 172 141*   CT Chest Wo Contrast  Result Date: 03/09/2022 CLINICAL DATA:  Chest pain, shortness of breath. EXAM: CT CHEST WITHOUT CONTRAST TECHNIQUE: Multidetector CT imaging of the chest was performed following the standard protocol without IV contrast. RADIATION DOSE REDUCTION: This exam was performed according to the departmental dose-optimization program which includes automated exposure control, adjustment of the mA and/or kV according to patient size and/or use of iterative reconstruction technique. COMPARISON:  November 23, 2013. FINDINGS: Cardiovascular: Atherosclerosis of thoracic aorta is noted. 4.3 cm ascending thoracic aortic aneurysm is noted. Mild cardiomegaly is noted. No pericardial effusion is noted. Mediastinum/Nodes: No enlarged mediastinal or axillary lymph nodes. Thyroid gland, trachea, and esophagus demonstrate no significant findings. Lungs/Pleura: Small bilateral pleural effusions are noted. No pneumothorax is noted. Multiple patchy airspace opacities are noted in both lungs, right greater than left, concerning for multifocal pneumonia. Upper Abdomen: No acute abnormality. Musculoskeletal: No chest wall mass or suspicious bone lesions identified. IMPRESSION: Multiple patchy airspace opacities are noted in both lungs, right greater than left, consistent with multifocal pneumonia. Small bilateral pleural effusions are noted. 4.3 cm ascending thoracic aortic aneurysm. Recommend annual imaging followup by CTA or MRA. This recommendation follows 2010 ACCF/AHA/AATS/ACR/ASA/SCA/SCAI/SIR/STS/SVM Guidelines for the Diagnosis and Management of Patients with Thoracic Aortic Disease. Circulation. 2010; 121: L275-T700. Aortic aneurysm NOS (ICD10-I71.9). Aortic Atherosclerosis (ICD10-I70.0). Electronically  Signed   By: Marijo Conception M.D.   On: 03/02/2022 08:57   DG Chest Port 1 View  Result Date: 03/05/2022 CLINICAL DATA:  Chest pain with shortness of breath. Read sputum and vomiting. EXAM: PORTABLE CHEST 1 VIEW COMPARISON:  12/30/2021 FINDINGS: Cardiomegaly. Small pleural effusions and diffuse interstitial coarsening with indistinct airspace type density at the bases. Distorted aortic and hilar contours from rotation. IMPRESSION: 1. Interstitial coarsening with indistinct density at the bases favoring infection or aspiration over CHF. 2. Cardiomegaly with small pleural effusions. Electronically Signed   By: Jorje Guild M.D.   On: 02/24/2022 07:14    ASSESSMENT / PLAN:  Acute respiratory failure with hypoxia Multifocal pneumonia/community-acquired , possibility of aspiration due to history of vomiting  -Agree with IV ceftriaxone -Obtain urine strep antigen and Legionella for completion -Partial CODE STATUS as of now with DNI , but agree with team that chest compressions without intubation would likely not be helpful in the event of cardiac/respiratory arrest in her current situation  Hyponatremia -Per primary team  Kara Mead MD. [place] Pager [phone] If no response call [phone]    02/18/2022, 12:57 PM

## 2022-02-18 NOTE — Progress Notes (Signed)
Initial Nutrition Assessment ? ?DOCUMENTATION CODES:  ? ?Not applicable ? ?INTERVENTION:  ?- Encourage PO intake ?- Ensure Enlive po BID, each supplement provides 350 kcal and 20 grams of protein (pt prefers chocolate or vanilla) ? ?NUTRITION DIAGNOSIS:  ? ?Increased nutrient needs related to acute illness as evidenced by estimated needs. ? ? ?GOAL:  ? ?Patient will meet greater than or equal to 90% of their needs ? ?MONITOR:  ? ?PO intake, Supplement acceptance, Diet advancement, Labs, Weight trends, I & O's ? ?REASON FOR ASSESSMENT:  ? ?Malnutrition Screening Tool ?  ? ?ASSESSMENT:  ? ?Pt admitted from home with acute respiratory failure with hypoxia secondary to PNA. Brought in by EMS with compaints of SOB and chest pain. Reported episodes of vomiting and red sputum. PMH includes afib and prior h/o DVT ? ?ST present during visit performing bedside swallow evaluation. Daughter, [name], at bedside providing most history. She states that she has eaten well today on full liquid diet, she had soup and pudding today. No notable changes to PO intake PTA except for day of admission. PTA she would eat as she was hungry. She is familiar with Ensure and Boost products and is agreeable to receive them during admission. ? ?ST recommendation of mechanical soft, thin liquid.  ? ?Per review of chart, pt's weights noted with steady decline within the last year. Noted 4% weight loss within the last 6 months. This is not significant for time frame.  ? ?Medications: remeron, protonix, warfarin, IV abx ? ?Labs: sodium 125, calcium 8.2 ? ?I/O's: +942 ml since admission ? ?NUTRITION - FOCUSED PHYSICAL EXAM: ?Deferred to follow up. ? ?Diet Order:   ?Diet Order   ? ?       ?  Diet full liquid Room service appropriate? Yes; Fluid consistency: Thin  Diet effective now       ?  ? ?  ?  ? ?  ? ? ?EDUCATION NEEDS:  ? ?No education needs have been identified at this time ? ?Skin:  Skin Assessment: Reviewed RN Assessment ? ?Last BM:   3/7 ? ?Height:  ? ?Ht Readings from Last 1 Encounters:  ?02/26/2022 '5\' 5"'$  (1.651 m)  ? ? ?Weight:  ? ?Wt Readings from Last 1 Encounters:  ?02/18/22 72.9 kg  ? ?BMI:  Body mass index is 26.74 kg/m?. ? ?Estimated Nutritional Needs:  ? ?Kcal:  1500-1700 ? ?Protein:  75-90g ? ?Fluid:  >/=1.5L ? ?Clayborne Dana, RDN, LDN ?Clinical Nutrition ?

## 2022-02-18 NOTE — Evaluation (Signed)
Clinical/Bedside Swallow Evaluation Patient Details  Name: [name] MRN: [id number] Date of Birth: 07-09-1921  Today's Date: 02/18/2022 Time: SLP Start Time (ACUTE ONLY): 66 SLP Stop Time (ACUTE ONLY): 8127 SLP Time Calculation (min) (ACUTE ONLY): 26 min  Past Medical History:  Past Medical History:  Diagnosis Date   AAA (abdominal aortic aneurysm)    Allergy    Anxiety    Bladder cancer ([place]) dx'd 2011   Chronic microscopic hematuria; transitional cell cancer   Blood transfusion 1960   "related to hysterectomy"   Chronic atrial fibrillation ([place])    Anticoagulated with warfarin, rate control with diltiazem and Toprol   Chronic back pain    Chronic headache    "probably 2/wk" (08/28/2015)   Concussion w/o coma 04/28/16   Complicated by subarachnoid hemorrhage.  "even now has times when she's not able to comprehend" (05/08/12)   Coronary artery disease    Nonobstructive   Diverticulitis    Diverticulosis    DVT (deep venous thrombosis), right 2005   "right calf after 8 foot fall"   Edema of both legs    Chronic, thought to be secondary to DVTs   External hemorrhoids    Family history of adverse reaction to anesthesia 2012   daughter "had OR for crushed hand; had problems w/anesthesia & I was in there all day long" (   Fatty liver    Frequent UTI    GERD (gastroesophageal reflux disease)    H/O hiatal hernia    High cholesterol    Migraine    "rare now" (08/28/2015)   Pneumonia ~ 2010;  2013; 07/2015   Rheumatoid arthritis(714.0)    "hands" (08/28/2015)   Urinary frequency    Past Surgical History:  Past Surgical History:  Procedure Laterality Date   BREAST CYST EXCISION Left 1960's?   2 cysts; benign   CARDIAC CATHETERIZATION  1980's   CATARACT EXTRACTION W/ INTRAOCULAR LENS  IMPLANT, BILATERAL Bilateral 1992   CYSTOSCOPY  11/11/2011   Procedure: CYSTOSCOPY;  Surgeon: Malka So;  Location: [facility];  Service: Urology;  Laterality: N/A;   CYSTOSTOMY W/  BLADDER BIOPSY  10/08/2013   INCONTINENCE SURGERY  1980's   JOINT REPLACEMENT     KNEE ARTHROSCOPY Right 2005   S/P fall   Lower Extremity Venous Doppler  11/10/2013   No DVT or superficial thrombus enlarged inguinal lymph node noted in the right. No Baker's cyst.   TONSILLECTOMY  1952   TOTAL KNEE ARTHROPLASTY Right 02/05/2013   Procedure: TOTAL KNEE ARTHROPLASTY;  Surgeon: Mauri Pole, MD;  Location: [facility];  Service: Orthopedics;  Laterality: Right;   TRANSTHORACIC ECHOCARDIOGRAM  01/'15; 4/'19   a) EF 60-65%. NO RWMA. Ao Sclerosis. MAC - no MS with mild MR>  B Atriae mildly dilated;; b)  EF 55-60%. AoV Sclerosis (no stenosis).  Trivial AI & MR. Mild LAE & Severe RAE.. Mild RV dilation   TRANSURETHRAL RESECTION OF BLADDER TUMOR  11/11/2011   Procedure: TRANSURETHRAL RESECTION OF BLADDER TUMOR (TURBT);  Surgeon: Malka So;  Location: [facility];  Service: Urology;  Laterality: N/A;  Cysto, Bladder Biopsy, TURBT with Gyrus,    TRANSURETHRAL RESECTION OF BLADDER TUMOR WITH GYRUS (TURBT-GYRUS)  2007; 2009; 2010   "for tumors on surface of bladder"   TUMOR EXCISION  1976; 1980's   "fatty tumor cut off her upper back; left thumb"   VAGINAL HYSTERECTOMY  1960   partial    HPI:  88 year old admitted with multifocal pneumonia  and acute respiratory failure requiring high flow oxygen for which PCCM is consulted  She was brought in by EMS with complaints of shortness of breath and chest pain that started around 2 AM on 3/8.  She reported episodes of vomiting and red sputum, she was found to be in atrial fibrillation with RVR requiring IV Cardizem drip, UA was suggestive of UTI, INR was 2.0, troponin was low, BNP 396 , serum sodium of 131  Chest x-ray showed cardiomegaly with small effusions and diffuse interstitial infiltrates.  CT chest without contrast showed multiple patchy areas of consolidation in both lungs right more than left with small bilateral effusions  4.3 cm ascending thoracic aortic  aneurysm was noted     -She was treated with Rocephin and azithromycin , admitted to the ICU initially requiring nonrebreather has now been weaned to 11 L high flow nasal cannula. BSE requested.    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 Daughter states that they were told many years ago that Pt's airway "does not close off" when she swallows. SLP unable to find record of previous SLP notes. Oral motor examination is WNL, Pt wears U/L dentures. Pt assessed with ice chips, thin water via tsp/cup/straw, puree, and mech soft textures. Pt with mouth breathing and visibly short of breath, also exhibits some belching (dtr states she does this at times at home). No overt coughing, some delayed throat clearing after trials. Pt is at risk for aspiration due to respiratory compromise and increased work of breathing with po intake. Pt assessed with straws and she could use these if taking small sips (will need feeder assist for now). Recommend D3/mech soft and thin liquids via small bites/sips and cues to repeat/dry swallow as needed. It Pt too short of breath, withold po until Pt is more calm. She responded well to cues to "smell the flowers and blow out the candles" going nice and slow. Pt and daughter are in agreement with plan of care. SLP will follow during acute stay. SLP Visit Diagnosis: Dysphagia, unspecified (R13.10)    Aspiration Risk  Mild aspiration risk    Diet Recommendation Dysphagia 3 (Mech soft);Thin liquid   Liquid Administration via: Cup;Straw Medication Administration: Whole meds with liquid Supervision: Staff to assist with self feeding;Full supervision/cueing for compensatory strategies;Patient able to self feed Compensations: Small sips/bites;Slow rate;Multiple dry swallows after each bite/sip Postural Changes: Seated upright at 90  degrees;Remain upright for at least 30 minutes after po intake    Other  Recommendations Oral Care Recommendations: Oral care BID;Staff/trained caregiver to provide oral care Other Recommendations: Clarify dietary restrictions    Recommendations for follow up therapy are one component of a multi-disciplinary discharge planning process, led by the attending physician.  Recommendations may be updated based on patient status, additional functional criteria and insurance authorization.  Follow up Recommendations  (pending clinical course)      Assistance Recommended at Discharge Frequent or constant Supervision/Assistance  Functional Status Assessment Patient has had a recent decline in their functional status and demonstrates the ability to make significant improvements in function in a reasonable and predictable amount of time.  Frequency and Duration min 2x/week  1 week       Prognosis Prognosis for Safe Diet Advancement: Fair Barriers to Reach Goals:  (respiratory compromise)      Swallow Study   General Date of  Onset: 02/18/22 HPI: 88 year old admitted with multifocal pneumonia and acute respiratory failure requiring high flow oxygen for which PCCM is consulted  She was brought in by EMS with complaints of shortness of breath and chest pain that started around 2 AM on 3/8.  She reported episodes of vomiting and red sputum, she was found to be in atrial fibrillation with RVR requiring IV Cardizem drip, UA was suggestive of UTI, INR was 2.0, troponin was low, BNP 396 , serum sodium of 131  Chest x-ray showed cardiomegaly with small effusions and diffuse interstitial infiltrates.  CT chest without contrast showed multiple patchy areas of consolidation in both lungs right more than left with small bilateral effusions  4.3 cm ascending thoracic aortic aneurysm was noted     -She was treated with Rocephin and azithromycin , admitted to the ICU initially requiring nonrebreather has now been weaned  to 11 L high flow nasal cannula. BSE requested. Type of Study: Bedside Swallow Evaluation Previous Swallow Assessment: N/A Diet Prior to this Study: Dysphagia 1 (puree);Thin liquids (full liquids) Temperature Spikes Noted: No Respiratory Status: Nasal cannula History of Recent Intubation: No Behavior/Cognition: Alert;Cooperative;Pleasant mood;Requires cueing Oral Cavity Assessment: Within Functional Limits Oral Care Completed by SLP: Recent completion by staff Oral Cavity - Dentition: Dentures, top;Dentures, bottom Vision: Functional for self-feeding Self-Feeding Abilities: Needs assist (Pt tremulous, atypical for her) Patient Positioning: Upright in bed Baseline Vocal Quality: Normal Volitional Cough: Strong;Congested Volitional Swallow: Able to elicit    Oral/Motor/Sensory Function Overall Oral Motor/Sensory Function: Within functional limits   Ice Chips Ice chips: Within functional limits Presentation: Spoon   Thin Liquid Thin Liquid: Impaired Presentation: Cup;Straw;Spoon Pharyngeal  Phase Impairments: Suspected delayed Swallow;Multiple swallows;Throat Clearing - Delayed    Nectar Thick Nectar Thick Liquid: Not tested   Honey Thick Honey Thick Liquid: Not tested   Puree Puree: Within functional limits Presentation: Spoon   Solid     Solid: Impaired Presentation: Spoon Oral Phase Impairments:  (visibly short of breath, mouth breathing while chewing) Oral Phase Functional Implications: Prolonged oral transit Other Comments: belching     Thank you,  Genene Churn, [place]  , 02/18/2022,3:53 PM

## 2022-02-18 NOTE — Progress Notes (Signed)
Recent Labs  Lab 02/16/2022 0646 02/18/22 0422  WBC 9.6 10.1  NEUTROABS 7.6  --   HGB 13.0 11.1*  HCT 38.9 33.6*  MCV 97.7 96.8  PLT 172 570*   Basic Metabolic Panel: Recent Labs  Lab 02/27/2022 0646 02/28/2022 0853 02/18/22 0422  NA 131*  --  125*  K 4.2  --  4.5  CL 95*  --  93*  CO2 26  --  24  GLUCOSE 135*  --  117*  BUN 13  --  20  CREATININE 0.69  --  0.82  CALCIUM 8.7*  --  8.2*  MG  --  1.9  --    GFR: Estimated Creatinine Clearance: 38.3 mL/min (by C-G formula based on SCr of 0.82 mg/dL). Liver Function Tests: Recent Labs  Lab 02/10/2022 0646  AST 21  ALT 17  ALKPHOS 93  BILITOT 1.0  PROT 6.8  ALBUMIN 3.5   Cardiac Enzymes: No results for input(s): CKTOTAL, CKMB, CKMBINDEX, TROPONINI in the last 168 hours. BNP (last 3 results) No results for input(s): PROBNP in the last 8760 hours. HbA1C: No results for input(s): HGBA1C in the last 72 hours. Sepsis Labs: '@LABRCNTIP'$ (procalcitonin:4,lacticidven:4) ) Recent Results (from the past 240 hour(s))  Resp Panel by RT-PCR (Flu A&B, Covid) Nasopharyngeal Swab     Status: None   Collection Time: 03/08/2022  7:21 AM   Specimen: Nasopharyngeal Swab; Nasopharyngeal(NP) swabs in vial transport medium  Result Value Ref Range Status   SARS Coronavirus 2 by RT PCR NEGATIVE NEGATIVE Final    Comment:  (NOTE) SARS-CoV-2 target nucleic acids are NOT DETECTED.  The SARS-CoV-2 RNA is generally detectable in upper respiratory specimens during the acute phase of infection. The lowest concentration of SARS-CoV-2 viral copies this assay can detect is 138 copies/mL. A negative result does not preclude SARS-Cov-2 infection and should not be used as the sole basis for treatment or other patient management decisions. A negative result may occur with  improper specimen collection/handling, submission of specimen other than nasopharyngeal swab, presence of viral mutation(s) within the areas targeted by this assay, and inadequate number of viral copies(<138 copies/mL). A negative result must be combined with clinical observations, patient history, and epidemiological information. The expected result is Negative.  Fact Sheet for Patients:  EntrepreneurPulse.com.au  Fact Sheet for Healthcare Providers:  IncredibleEmployment.be  This test is no t yet approved or cleared by the [place] FDA and  has been authorized for detection and/or diagnosis of SARS-CoV-2 by FDA under an Emergency Use Authorization (EUA). This EUA will remain  in effect (meaning this test can be used) for the duration of the COVID-19 declaration under Section 564(b)(1) of the Act, 21 U.S.C.section 360bbb-3(b)(1), unless the authorization is terminated  or revoked sooner.       Influenza A by PCR NEGATIVE NEGATIVE Final   Influenza B by PCR NEGATIVE NEGATIVE Final    Comment: (NOTE) The Xpert Xpress SARS-CoV-2/FLU/RSV plus assay is intended as an aid in the diagnosis of influenza from Nasopharyngeal swab specimens and should not be used as a sole basis for treatment. Nasal washings and aspirates are unacceptable for Xpert Xpress SARS-CoV-2/FLU/RSV testing.  Fact Sheet for Patients: EntrepreneurPulse.com.au  Fact Sheet for Healthcare  Providers: IncredibleEmployment.be  This test is not yet approved or cleared by the [place] FDA and has been authorized for detection and/or diagnosis of SARS-CoV-2 by FDA under an Emergency Use Authorization (EUA). This EUA will remain in effect (meaning this test can be used) for  Recent Labs  Lab 02/16/2022 0646 02/18/22 0422  WBC 9.6 10.1  NEUTROABS 7.6  --   HGB 13.0 11.1*  HCT 38.9 33.6*  MCV 97.7 96.8  PLT 172 570*   Basic Metabolic Panel: Recent Labs  Lab 02/27/2022 0646 02/28/2022 0853 02/18/22 0422  NA 131*  --  125*  K 4.2  --  4.5  CL 95*  --  93*  CO2 26  --  24  GLUCOSE 135*  --  117*  BUN 13  --  20  CREATININE 0.69  --  0.82  CALCIUM 8.7*  --  8.2*  MG  --  1.9  --    GFR: Estimated Creatinine Clearance: 38.3 mL/min (by C-G formula based on SCr of 0.82 mg/dL). Liver Function Tests: Recent Labs  Lab 02/10/2022 0646  AST 21  ALT 17  ALKPHOS 93  BILITOT 1.0  PROT 6.8  ALBUMIN 3.5   Cardiac Enzymes: No results for input(s): CKTOTAL, CKMB, CKMBINDEX, TROPONINI in the last 168 hours. BNP (last 3 results) No results for input(s): PROBNP in the last 8760 hours. HbA1C: No results for input(s): HGBA1C in the last 72 hours. Sepsis Labs: '@LABRCNTIP'$ (procalcitonin:4,lacticidven:4) ) Recent Results (from the past 240 hour(s))  Resp Panel by RT-PCR (Flu A&B, Covid) Nasopharyngeal Swab     Status: None   Collection Time: 03/08/2022  7:21 AM   Specimen: Nasopharyngeal Swab; Nasopharyngeal(NP) swabs in vial transport medium  Result Value Ref Range Status   SARS Coronavirus 2 by RT PCR NEGATIVE NEGATIVE Final    Comment:  (NOTE) SARS-CoV-2 target nucleic acids are NOT DETECTED.  The SARS-CoV-2 RNA is generally detectable in upper respiratory specimens during the acute phase of infection. The lowest concentration of SARS-CoV-2 viral copies this assay can detect is 138 copies/mL. A negative result does not preclude SARS-Cov-2 infection and should not be used as the sole basis for treatment or other patient management decisions. A negative result may occur with  improper specimen collection/handling, submission of specimen other than nasopharyngeal swab, presence of viral mutation(s) within the areas targeted by this assay, and inadequate number of viral copies(<138 copies/mL). A negative result must be combined with clinical observations, patient history, and epidemiological information. The expected result is Negative.  Fact Sheet for Patients:  EntrepreneurPulse.com.au  Fact Sheet for Healthcare Providers:  IncredibleEmployment.be  This test is no t yet approved or cleared by the [place] FDA and  has been authorized for detection and/or diagnosis of SARS-CoV-2 by FDA under an Emergency Use Authorization (EUA). This EUA will remain  in effect (meaning this test can be used) for the duration of the COVID-19 declaration under Section 564(b)(1) of the Act, 21 U.S.C.section 360bbb-3(b)(1), unless the authorization is terminated  or revoked sooner.       Influenza A by PCR NEGATIVE NEGATIVE Final   Influenza B by PCR NEGATIVE NEGATIVE Final    Comment: (NOTE) The Xpert Xpress SARS-CoV-2/FLU/RSV plus assay is intended as an aid in the diagnosis of influenza from Nasopharyngeal swab specimens and should not be used as a sole basis for treatment. Nasal washings and aspirates are unacceptable for Xpert Xpress SARS-CoV-2/FLU/RSV testing.  Fact Sheet for Patients: EntrepreneurPulse.com.au  Fact Sheet for Healthcare  Providers: IncredibleEmployment.be  This test is not yet approved or cleared by the [place] FDA and has been authorized for detection and/or diagnosis of SARS-CoV-2 by FDA under an Emergency Use Authorization (EUA). This EUA will remain in effect (meaning this test can be used) for  Recent Labs  Lab 02/16/2022 0646 02/18/22 0422  WBC 9.6 10.1  NEUTROABS 7.6  --   HGB 13.0 11.1*  HCT 38.9 33.6*  MCV 97.7 96.8  PLT 172 570*   Basic Metabolic Panel: Recent Labs  Lab 02/27/2022 0646 02/28/2022 0853 02/18/22 0422  NA 131*  --  125*  K 4.2  --  4.5  CL 95*  --  93*  CO2 26  --  24  GLUCOSE 135*  --  117*  BUN 13  --  20  CREATININE 0.69  --  0.82  CALCIUM 8.7*  --  8.2*  MG  --  1.9  --    GFR: Estimated Creatinine Clearance: 38.3 mL/min (by C-G formula based on SCr of 0.82 mg/dL). Liver Function Tests: Recent Labs  Lab 02/10/2022 0646  AST 21  ALT 17  ALKPHOS 93  BILITOT 1.0  PROT 6.8  ALBUMIN 3.5   Cardiac Enzymes: No results for input(s): CKTOTAL, CKMB, CKMBINDEX, TROPONINI in the last 168 hours. BNP (last 3 results) No results for input(s): PROBNP in the last 8760 hours. HbA1C: No results for input(s): HGBA1C in the last 72 hours. Sepsis Labs: '@LABRCNTIP'$ (procalcitonin:4,lacticidven:4) ) Recent Results (from the past 240 hour(s))  Resp Panel by RT-PCR (Flu A&B, Covid) Nasopharyngeal Swab     Status: None   Collection Time: 03/08/2022  7:21 AM   Specimen: Nasopharyngeal Swab; Nasopharyngeal(NP) swabs in vial transport medium  Result Value Ref Range Status   SARS Coronavirus 2 by RT PCR NEGATIVE NEGATIVE Final    Comment:  (NOTE) SARS-CoV-2 target nucleic acids are NOT DETECTED.  The SARS-CoV-2 RNA is generally detectable in upper respiratory specimens during the acute phase of infection. The lowest concentration of SARS-CoV-2 viral copies this assay can detect is 138 copies/mL. A negative result does not preclude SARS-Cov-2 infection and should not be used as the sole basis for treatment or other patient management decisions. A negative result may occur with  improper specimen collection/handling, submission of specimen other than nasopharyngeal swab, presence of viral mutation(s) within the areas targeted by this assay, and inadequate number of viral copies(<138 copies/mL). A negative result must be combined with clinical observations, patient history, and epidemiological information. The expected result is Negative.  Fact Sheet for Patients:  EntrepreneurPulse.com.au  Fact Sheet for Healthcare Providers:  IncredibleEmployment.be  This test is no t yet approved or cleared by the [place] FDA and  has been authorized for detection and/or diagnosis of SARS-CoV-2 by FDA under an Emergency Use Authorization (EUA). This EUA will remain  in effect (meaning this test can be used) for the duration of the COVID-19 declaration under Section 564(b)(1) of the Act, 21 U.S.C.section 360bbb-3(b)(1), unless the authorization is terminated  or revoked sooner.       Influenza A by PCR NEGATIVE NEGATIVE Final   Influenza B by PCR NEGATIVE NEGATIVE Final    Comment: (NOTE) The Xpert Xpress SARS-CoV-2/FLU/RSV plus assay is intended as an aid in the diagnosis of influenza from Nasopharyngeal swab specimens and should not be used as a sole basis for treatment. Nasal washings and aspirates are unacceptable for Xpert Xpress SARS-CoV-2/FLU/RSV testing.  Fact Sheet for Patients: EntrepreneurPulse.com.au  Fact Sheet for Healthcare  Providers: IncredibleEmployment.be  This test is not yet approved or cleared by the [place] FDA and has been authorized for detection and/or diagnosis of SARS-CoV-2 by FDA under an Emergency Use Authorization (EUA). This EUA will remain in effect (meaning this test can be used) for  0.63 mg Nebulization Q6H   mirtazapine  7.5 mg Oral QHS   olopatadine  1 drop Both Eyes BID   pantoprazole  40 mg Oral Daily   sodium chloride flush  3 mL Intravenous Q12H   sodium chloride flush  3 mL Intravenous Q12H   Vibegron  1 tablet Oral Daily   warfarin  3 mg Oral ONCE-1600   Warfarin - Pharmacist Dosing Inpatient   Does not apply q1600   Continuous Infusions:  sodium chloride     sodium chloride     cefTRIAXone (ROCEPHIN)  IV Stopped (02/18/22 0555)   diltiazem (CARDIZEM) infusion 15  mg/hr (02/18/22 1139)   doxycycline (VIBRAMYCIN) IV Stopped (02/18/22 1137)   PRN Meds:.sodium chloride, acetaminophen **OR** acetaminophen, bisacodyl, morphine injection, ondansetron **OR** ondansetron (ZOFRAN) IV, polyethylene glycol, sodium chloride flush, traZODone   Anti-infectives (From admission, onward)    Start     Dose/Rate Route Frequency Ordered Stop   02/18/22 1000  doxycycline (VIBRAMYCIN) 100 mg in sodium chloride 0.9 % 250 mL IVPB        100 mg 125 mL/hr over 120 Minutes Intravenous Every 12 hours 02/18/22 0815     02/18/22 0600  cefTRIAXone (ROCEPHIN) 2 g in sodium chloride 0.9 % 100 mL IVPB        2 g 200 mL/hr over 30 Minutes Intravenous Every 24 hours 02/26/2022 1824     02/25/2022 1915  fluconazole (DIFLUCAN) tablet 200 mg        200 mg Oral Daily 02/12/2022 1820 02/20/22 0959   03/11/2022 0945  cefTRIAXone (ROCEPHIN) 1 g in sodium chloride 0.9 % 100 mL IVPB        1 g 200 mL/hr over 30 Minutes Intravenous  Once 02/20/2022 0930 03/09/2022 1015   03/10/2022 0945  azithromycin (ZITHROMAX) 500 mg in sodium chloride 0.9 % 250 mL IVPB  Status:  Discontinued        500 mg 250 mL/hr over 60 Minutes Intravenous Every 24 hours 02/24/2022 0930 02/18/22 0815        Subjective: [name] today has no fevers, no emesis,  No chest pain,   Daughter at bedside Cough , dyspnea and persistent severe hypoxia persist -Required NRB overnight -Requiring high flow oxygen at this -  Objective: Vitals:   02/18/22 1152 02/18/22 1330 02/18/22 1400 02/18/22 1500  BP:  (!) 163/88 (!) 135/48 (!) 149/70  Pulse:  92 96 98  Resp:    (!) 21  Temp: 98.2 F (36.8 C)     TempSrc: Oral     SpO2:      Weight:      Height:        Intake/Output Summary (Last 24 hours) at 02/18/2022 1531 Last data filed at 02/18/2022 1139 Gross per 24 hour  Intake 497.98 ml  Output --  Net 497.98 ml   Filed Weights   02/16/2022 0635 03/05/2022 1348 02/18/22 0442  Weight: 63.5 kg 70.3 kg 72.9 kg    Physical  Exam Gen:- Awake Alert, conversational dyspnea HEENT:- [place].AT, No sclera icterus Nose- [place] HFNC Ears-HOH Neck-Supple Neck,No JVD,.  Lungs-diminished breath sounds with scattered wheezes and rhonchi CV- S1, S2 normal, irregularly irregular Abd-  +ve B.Sounds, Abd Soft, No tenderness,    Extremity/Skin:-Chronic bilateral lower extremity pitting edema, pedal pulses present  Psych-affect is somewhat anxious,, baseline/underlying cognitive and memory deficits Neuro-generalized weakness and deconditioning, no new focal deficits, no tremors  Data Reviewed: I have personally reviewed following labs and imaging studies  CBC:  Recent Labs  Lab 02/16/2022 0646 02/18/22 0422  WBC 9.6 10.1  NEUTROABS 7.6  --   HGB 13.0 11.1*  HCT 38.9 33.6*  MCV 97.7 96.8  PLT 172 570*   Basic Metabolic Panel: Recent Labs  Lab 02/27/2022 0646 02/28/2022 0853 02/18/22 0422  NA 131*  --  125*  K 4.2  --  4.5  CL 95*  --  93*  CO2 26  --  24  GLUCOSE 135*  --  117*  BUN 13  --  20  CREATININE 0.69  --  0.82  CALCIUM 8.7*  --  8.2*  MG  --  1.9  --    GFR: Estimated Creatinine Clearance: 38.3 mL/min (by C-G formula based on SCr of 0.82 mg/dL). Liver Function Tests: Recent Labs  Lab 02/10/2022 0646  AST 21  ALT 17  ALKPHOS 93  BILITOT 1.0  PROT 6.8  ALBUMIN 3.5   Cardiac Enzymes: No results for input(s): CKTOTAL, CKMB, CKMBINDEX, TROPONINI in the last 168 hours. BNP (last 3 results) No results for input(s): PROBNP in the last 8760 hours. HbA1C: No results for input(s): HGBA1C in the last 72 hours. Sepsis Labs: '@LABRCNTIP'$ (procalcitonin:4,lacticidven:4) ) Recent Results (from the past 240 hour(s))  Resp Panel by RT-PCR (Flu A&B, Covid) Nasopharyngeal Swab     Status: None   Collection Time: 03/08/2022  7:21 AM   Specimen: Nasopharyngeal Swab; Nasopharyngeal(NP) swabs in vial transport medium  Result Value Ref Range Status   SARS Coronavirus 2 by RT PCR NEGATIVE NEGATIVE Final    Comment:  (NOTE) SARS-CoV-2 target nucleic acids are NOT DETECTED.  The SARS-CoV-2 RNA is generally detectable in upper respiratory specimens during the acute phase of infection. The lowest concentration of SARS-CoV-2 viral copies this assay can detect is 138 copies/mL. A negative result does not preclude SARS-Cov-2 infection and should not be used as the sole basis for treatment or other patient management decisions. A negative result may occur with  improper specimen collection/handling, submission of specimen other than nasopharyngeal swab, presence of viral mutation(s) within the areas targeted by this assay, and inadequate number of viral copies(<138 copies/mL). A negative result must be combined with clinical observations, patient history, and epidemiological information. The expected result is Negative.  Fact Sheet for Patients:  EntrepreneurPulse.com.au  Fact Sheet for Healthcare Providers:  IncredibleEmployment.be  This test is no t yet approved or cleared by the [place] FDA and  has been authorized for detection and/or diagnosis of SARS-CoV-2 by FDA under an Emergency Use Authorization (EUA). This EUA will remain  in effect (meaning this test can be used) for the duration of the COVID-19 declaration under Section 564(b)(1) of the Act, 21 U.S.C.section 360bbb-3(b)(1), unless the authorization is terminated  or revoked sooner.       Influenza A by PCR NEGATIVE NEGATIVE Final   Influenza B by PCR NEGATIVE NEGATIVE Final    Comment: (NOTE) The Xpert Xpress SARS-CoV-2/FLU/RSV plus assay is intended as an aid in the diagnosis of influenza from Nasopharyngeal swab specimens and should not be used as a sole basis for treatment. Nasal washings and aspirates are unacceptable for Xpert Xpress SARS-CoV-2/FLU/RSV testing.  Fact Sheet for Patients: EntrepreneurPulse.com.au  Fact Sheet for Healthcare  Providers: IncredibleEmployment.be  This test is not yet approved or cleared by the [place] FDA and has been authorized for detection and/or diagnosis of SARS-CoV-2 by FDA under an Emergency Use Authorization (EUA). This EUA will remain in effect (meaning this test can be used) for  Recent Labs  Lab 02/16/2022 0646 02/18/22 0422  WBC 9.6 10.1  NEUTROABS 7.6  --   HGB 13.0 11.1*  HCT 38.9 33.6*  MCV 97.7 96.8  PLT 172 570*   Basic Metabolic Panel: Recent Labs  Lab 02/27/2022 0646 02/28/2022 0853 02/18/22 0422  NA 131*  --  125*  K 4.2  --  4.5  CL 95*  --  93*  CO2 26  --  24  GLUCOSE 135*  --  117*  BUN 13  --  20  CREATININE 0.69  --  0.82  CALCIUM 8.7*  --  8.2*  MG  --  1.9  --    GFR: Estimated Creatinine Clearance: 38.3 mL/min (by C-G formula based on SCr of 0.82 mg/dL). Liver Function Tests: Recent Labs  Lab 02/10/2022 0646  AST 21  ALT 17  ALKPHOS 93  BILITOT 1.0  PROT 6.8  ALBUMIN 3.5   Cardiac Enzymes: No results for input(s): CKTOTAL, CKMB, CKMBINDEX, TROPONINI in the last 168 hours. BNP (last 3 results) No results for input(s): PROBNP in the last 8760 hours. HbA1C: No results for input(s): HGBA1C in the last 72 hours. Sepsis Labs: '@LABRCNTIP'$ (procalcitonin:4,lacticidven:4) ) Recent Results (from the past 240 hour(s))  Resp Panel by RT-PCR (Flu A&B, Covid) Nasopharyngeal Swab     Status: None   Collection Time: 03/08/2022  7:21 AM   Specimen: Nasopharyngeal Swab; Nasopharyngeal(NP) swabs in vial transport medium  Result Value Ref Range Status   SARS Coronavirus 2 by RT PCR NEGATIVE NEGATIVE Final    Comment:  (NOTE) SARS-CoV-2 target nucleic acids are NOT DETECTED.  The SARS-CoV-2 RNA is generally detectable in upper respiratory specimens during the acute phase of infection. The lowest concentration of SARS-CoV-2 viral copies this assay can detect is 138 copies/mL. A negative result does not preclude SARS-Cov-2 infection and should not be used as the sole basis for treatment or other patient management decisions. A negative result may occur with  improper specimen collection/handling, submission of specimen other than nasopharyngeal swab, presence of viral mutation(s) within the areas targeted by this assay, and inadequate number of viral copies(<138 copies/mL). A negative result must be combined with clinical observations, patient history, and epidemiological information. The expected result is Negative.  Fact Sheet for Patients:  EntrepreneurPulse.com.au  Fact Sheet for Healthcare Providers:  IncredibleEmployment.be  This test is no t yet approved or cleared by the [place] FDA and  has been authorized for detection and/or diagnosis of SARS-CoV-2 by FDA under an Emergency Use Authorization (EUA). This EUA will remain  in effect (meaning this test can be used) for the duration of the COVID-19 declaration under Section 564(b)(1) of the Act, 21 U.S.C.section 360bbb-3(b)(1), unless the authorization is terminated  or revoked sooner.       Influenza A by PCR NEGATIVE NEGATIVE Final   Influenza B by PCR NEGATIVE NEGATIVE Final    Comment: (NOTE) The Xpert Xpress SARS-CoV-2/FLU/RSV plus assay is intended as an aid in the diagnosis of influenza from Nasopharyngeal swab specimens and should not be used as a sole basis for treatment. Nasal washings and aspirates are unacceptable for Xpert Xpress SARS-CoV-2/FLU/RSV testing.  Fact Sheet for Patients: EntrepreneurPulse.com.au  Fact Sheet for Healthcare  Providers: IncredibleEmployment.be  This test is not yet approved or cleared by the [place] FDA and has been authorized for detection and/or diagnosis of SARS-CoV-2 by FDA under an Emergency Use Authorization (EUA). This EUA will remain in effect (meaning this test can be used) for  Recent Labs  Lab 02/16/2022 0646 02/18/22 0422  WBC 9.6 10.1  NEUTROABS 7.6  --   HGB 13.0 11.1*  HCT 38.9 33.6*  MCV 97.7 96.8  PLT 172 570*   Basic Metabolic Panel: Recent Labs  Lab 02/27/2022 0646 02/28/2022 0853 02/18/22 0422  NA 131*  --  125*  K 4.2  --  4.5  CL 95*  --  93*  CO2 26  --  24  GLUCOSE 135*  --  117*  BUN 13  --  20  CREATININE 0.69  --  0.82  CALCIUM 8.7*  --  8.2*  MG  --  1.9  --    GFR: Estimated Creatinine Clearance: 38.3 mL/min (by C-G formula based on SCr of 0.82 mg/dL). Liver Function Tests: Recent Labs  Lab 02/10/2022 0646  AST 21  ALT 17  ALKPHOS 93  BILITOT 1.0  PROT 6.8  ALBUMIN 3.5   Cardiac Enzymes: No results for input(s): CKTOTAL, CKMB, CKMBINDEX, TROPONINI in the last 168 hours. BNP (last 3 results) No results for input(s): PROBNP in the last 8760 hours. HbA1C: No results for input(s): HGBA1C in the last 72 hours. Sepsis Labs: '@LABRCNTIP'$ (procalcitonin:4,lacticidven:4) ) Recent Results (from the past 240 hour(s))  Resp Panel by RT-PCR (Flu A&B, Covid) Nasopharyngeal Swab     Status: None   Collection Time: 03/08/2022  7:21 AM   Specimen: Nasopharyngeal Swab; Nasopharyngeal(NP) swabs in vial transport medium  Result Value Ref Range Status   SARS Coronavirus 2 by RT PCR NEGATIVE NEGATIVE Final    Comment:  (NOTE) SARS-CoV-2 target nucleic acids are NOT DETECTED.  The SARS-CoV-2 RNA is generally detectable in upper respiratory specimens during the acute phase of infection. The lowest concentration of SARS-CoV-2 viral copies this assay can detect is 138 copies/mL. A negative result does not preclude SARS-Cov-2 infection and should not be used as the sole basis for treatment or other patient management decisions. A negative result may occur with  improper specimen collection/handling, submission of specimen other than nasopharyngeal swab, presence of viral mutation(s) within the areas targeted by this assay, and inadequate number of viral copies(<138 copies/mL). A negative result must be combined with clinical observations, patient history, and epidemiological information. The expected result is Negative.  Fact Sheet for Patients:  EntrepreneurPulse.com.au  Fact Sheet for Healthcare Providers:  IncredibleEmployment.be  This test is no t yet approved or cleared by the [place] FDA and  has been authorized for detection and/or diagnosis of SARS-CoV-2 by FDA under an Emergency Use Authorization (EUA). This EUA will remain  in effect (meaning this test can be used) for the duration of the COVID-19 declaration under Section 564(b)(1) of the Act, 21 U.S.C.section 360bbb-3(b)(1), unless the authorization is terminated  or revoked sooner.       Influenza A by PCR NEGATIVE NEGATIVE Final   Influenza B by PCR NEGATIVE NEGATIVE Final    Comment: (NOTE) The Xpert Xpress SARS-CoV-2/FLU/RSV plus assay is intended as an aid in the diagnosis of influenza from Nasopharyngeal swab specimens and should not be used as a sole basis for treatment. Nasal washings and aspirates are unacceptable for Xpert Xpress SARS-CoV-2/FLU/RSV testing.  Fact Sheet for Patients: EntrepreneurPulse.com.au  Fact Sheet for Healthcare  Providers: IncredibleEmployment.be  This test is not yet approved or cleared by the [place] FDA and has been authorized for detection and/or diagnosis of SARS-CoV-2 by FDA under an Emergency Use Authorization (EUA). This EUA will remain in effect (meaning this test can be used) for

## 2022-02-18 NOTE — Progress Notes (Signed)
*  PRELIMINARY RESULTS* ?Echocardiogram ?2D Echocardiogram has been performed. ? ?[name] ?02/18/2022, 2:09 PM ?

## 2022-02-18 NOTE — Progress Notes (Addendum)
ANTICOAGULATION CONSULT NOTE - Initial Consult ? ?Pharmacy Consult for warfarin ?Indication: atrial fibrillation ? ?Allergies  ?Allergen Reactions  ? Contrast Media [Iodinated Contrast Media] Hives  ? Amoxicillin Itching  ? Ampicillin Itching  ? Cefdinir Nausea And Vomiting  ? Cortisol [Hydrocortisone] Itching  ? Ivp Dye [Iodinated Contrast Media] Hives  ? Levaquin [Levofloxacin] Itching and Rash  ? Penicillins Rash  ?  "haven't had it in years"  ? Vancomycin Itching and Rash  ? ? ?Patient Measurements: ?Height: '5\' 5"'$  (165.1 cm) ?Weight: 72.9 kg (160 lb 11.5 oz) ?IBW/kg (Calculated) : 57 ?Heparin Dosing Weight:  ? ?Vital Signs: ?Temp: 98.6 ?F (37 ?C) (03/09 0727) ?Temp Source: Oral (03/09 4010) ?BP: 148/78 (03/09 0902) ?Pulse Rate: 98 (03/09 0902) ? ?Labs: ?Recent Labs  ?  02/15/22 ?1248 02/12/2022 ?0646 03/02/2022 ?0656 02/14/2022 ?0853 02/18/22 ?0422  ?HGB  --  13.0  --   --  11.1*  ?HCT  --  38.9  --   --  33.6*  ?PLT  --  172  --   --  141*  ?LABPROT  --   --  22.4*  --  25.3*  ?INR 1.8*  --  2.0*  --  2.3*  ?CREATININE  --  0.69  --   --  0.82  ?TROPONINIHS  --  18*  --  18*  --   ? ? ?Estimated Creatinine Clearance: 38.3 mL/min (by C-G formula based on SCr of 0.82 mg/dL). ? ? ?Medical History: ?Past Medical History:  ?Diagnosis Date  ? AAA (abdominal aortic aneurysm)   ? Allergy   ? Anxiety   ? Bladder cancer ([place]) dx'd 2011  ? Chronic microscopic hematuria; transitional cell cancer  ? Blood transfusion 1960  ? "related to hysterectomy"  ? Chronic atrial fibrillation (HCC)   ? Anticoagulated with warfarin, rate control with diltiazem and Toprol  ? Chronic back pain   ? Chronic headache   ? "probably 2/wk" (08/28/2015)  ? Concussion w/o coma 03/31/2004  ? Complicated by subarachnoid hemorrhage.  "even now has times when she's not able to comprehend" (05/08/12)  ? Coronary artery disease   ? Nonobstructive  ? Diverticulitis   ? Diverticulosis   ? DVT (deep venous thrombosis), right 2005  ? "right calf after 8 foot fall"   ? Edema of both legs   ? Chronic, thought to be secondary to DVTs  ? External hemorrhoids   ? Family history of adverse reaction to anesthesia 2012  ? daughter "had OR for crushed hand; had problems w/anesthesia & I was in there all day long" (  ? Fatty liver   ? Frequent UTI   ? GERD (gastroesophageal reflux disease)   ? H/O hiatal hernia   ? High cholesterol   ? Migraine   ? "rare now" (08/28/2015)  ? Pneumonia ~ 2010;  2013; 07/2015  ? Rheumatoid arthritis(714.0)   ? "hands" (08/28/2015)  ? Urinary frequency   ? ? ?Medications:  ?Medications Prior to Admission  ?Medication Sig Dispense Refill Last Dose  ? acetaminophen (TYLENOL) 500 MG tablet Take 500 mg by mouth every 6 (six) hours as needed for mild pain or moderate pain.   unk  ? atorvastatin (LIPITOR) 10 MG tablet TAKE 1 TABLET BY MOUTH EVERY DAY 90 tablet 0 02/16/2022  ? calcium carbonate (OS-CAL - DOSED IN MG OF ELEMENTAL CALCIUM) 1250 MG tablet Take 1 tablet by mouth every morning.    02/16/2022  ? cholecalciferol (VITAMIN D) 1000 UNITS tablet Take 1,000  Units by mouth daily.   02/16/2022  ? DILT-XR 240 MG 24 hr capsule TAKE 1 CAPSULE BY MOUTH EVERY MORNING ON AN EMPTY STOMACH 90 capsule 0 02/16/2022  ? furosemide (LASIX) 20 MG tablet TAKE ONE TO TWO TABLETS EVERY DAY AS INSTRUCTED 180 tablet 1 02/16/2022  ? loratadine (CLARITIN) 10 MG tablet TAKE 1 TABLET (10 MG TOTAL) BY MOUTH DAILY AS NEEDED FOR ALLERGIES OR ITCHING. 90 tablet 1 02/16/2022  ? Multiple Vitamins-Minerals (PRESERVISION AREDS 2+MULTI VIT PO) Take by mouth.   02/16/2022  ? olopatadine (PATANOL) 0.1 % ophthalmic solution PLACE 1 DROP IN AFFECTED EYE(S) TWICE DAILY (Patient taking differently: Place 1 drop into both eyes 2 (two) times daily.) 15 mL 5 02/16/2022  ? Omega-3 Fatty Acids (FISH OIL) 1000 MG CAPS Take 1,000 mg by mouth daily.   02/16/2022  ? omeprazole (PRILOSEC) 20 MG capsule TAKE 1 CAPSULE BY MOUTH EVERY DAY 90 capsule 1 02/16/2022  ? ondansetron (ZOFRAN-ODT) 4 MG disintegrating tablet TAKE 1 TABLET BY  MOUTH EVERY 8 HOURS AS NEEDED FOR NAUSEA 10 tablet 1 Past Week  ? Vibegron (GEMTESA) 75 MG TABS Take 1 tablet by mouth daily. 30 tablet 3 02/16/2022  ? warfarin (COUMADIN) 3 MG tablet TAKE 1 TABLET BY MOUTH EVERY DAY EXCEPT TAKE 2 ON MONDAY, WEDNESDAY, FRIDAY, AND SATURDAY 140 tablet 2 02/16/2022 at 0900  ? baclofen (LIORESAL) 10 MG tablet Take 1 tablet (10 mg total) by mouth 3 (three) times daily. (Patient not taking: Reported on 03/05/2022) 60 each 0 Not Taking  ? mirtazapine (REMERON) 7.5 MG tablet TAKE 1 TABLET BY MOUTH EVERYDAY AT BEDTIME (Patient not taking: Reported on 03/06/2022) 90 tablet 0 Not Taking  ? Misc. Devices (TOILET SAFETY FRAME) MISC UAD 1 each 0   ? PROAIR HFA 108 (90 Base) MCG/ACT inhaler TAKE 2 PUFFS BY MOUTH EVERY 6 HOURS AS NEEDED FOR WHEEZE OR SHORTNESS OF BREATH (Patient not taking: Reported on 02/22/2022) 8.5 Inhaler 4 Not Taking  ? ? ?Assessment: ?Pharmacy consulted to dose warfarin in patient with atrial fibrillation.  Patient's home dose listed as 6 mg MWF and 3 mg ROW.  Patient missed 3/8 dose due to being NPO. ? ?Patient on fluconazole- consider dose reduction of warfarin during use ? ?INR 2.3 ?CBC WNL ? ?Goal of Therapy:  ?INR 2-3 ?Monitor platelets by anticoagulation protocol: Yes ?  ?Plan:  ?Warfarin 3 mg today. ?Monitor daily INR and s/s of bleeding. ? ?Margot Ables, PharmD ?Clinical Pharmacist ?02/18/2022 10:54 AM ? ? ? ?

## 2022-02-18 NOTE — Progress Notes (Signed)
?  Transition of Care (TOC) Screening Note ? ? ?Patient Details  ?Name: [name] ?Date of Birth: March 22, 1921 ? ? ?Transition of Care (TOC) CM/SW Contact:    ?Iona Beard, LCSWA ?Phone Number: ?02/18/2022, 11:18 AM ? ? ? ?Transition of Care Department [facility]) has reviewed patient and no TOC needs have been identified at this time. We will continue to monitor patient advancement through interdisciplinary progression rounds. If new patient transition needs arise, please place a TOC consult. ?  ?

## 2022-02-19 DIAGNOSIS — I4891 Unspecified atrial fibrillation: Secondary | ICD-10-CM | POA: Diagnosis not present

## 2022-02-19 DIAGNOSIS — J9601 Acute respiratory failure with hypoxia: Secondary | ICD-10-CM | POA: Diagnosis not present

## 2022-02-19 DIAGNOSIS — J189 Pneumonia, unspecified organism: Secondary | ICD-10-CM | POA: Diagnosis not present

## 2022-02-19 DIAGNOSIS — Z7189 Other specified counseling: Secondary | ICD-10-CM | POA: Diagnosis not present

## 2022-02-19 LAB — RENAL FUNCTION PANEL
Albumin: 3 g/dL — ABNORMAL LOW (ref 3.5–5.0)
Anion gap: 8 (ref 5–15)
BUN: 21 mg/dL (ref 8–23)
CO2: 22 mmol/L (ref 22–32)
Calcium: 8.4 mg/dL — ABNORMAL LOW (ref 8.9–10.3)
Chloride: 91 mmol/L — ABNORMAL LOW (ref 98–111)
Creatinine, Ser: 0.75 mg/dL (ref 0.44–1.00)
GFR, Estimated: >60 mL/min (ref >60)
Glucose, Bld: 156 mg/dL — ABNORMAL HIGH (ref 70–99)
Phosphorus: 3.4 mg/dL (ref 2.5–4.6)
Potassium: 4.1 mmol/L (ref 3.5–5.1)
Sodium: 121 mmol/L — ABNORMAL LOW (ref 135–145)

## 2022-02-19 LAB — CBC
HCT: 34.3 % — ABNORMAL LOW (ref 36.0–46.0)
Hemoglobin: 11 g/dL — ABNORMAL LOW (ref 12.0–15.0)
MCH: 30.8 pg (ref 26.0–34.0)
MCHC: 32.1 g/dL (ref 30.0–36.0)
MCV: 96.1 fL (ref 80.0–100.0)
Platelets: 146 10*3/uL — ABNORMAL LOW (ref 150–400)
RBC: 3.57 MIL/uL — ABNORMAL LOW (ref 3.87–5.11)
RDW: 13.2 % (ref 11.5–15.5)
WBC: 12.9 10*3/uL — ABNORMAL HIGH (ref 4.0–10.5)
nRBC: 0 % (ref 0.0–0.2)

## 2022-02-19 LAB — LEGIONELLA PNEUMOPHILA SEROGP 1 UR AG: L. pneumophila Serogp 1 Ur Ag: NEGATIVE

## 2022-02-19 LAB — PROTIME-INR
INR: 2.4 — ABNORMAL HIGH (ref 0.8–1.2)
Prothrombin Time: 25.7 seconds — ABNORMAL HIGH (ref 11.4–15.2)

## 2022-02-19 LAB — GLUCOSE, CAPILLARY
Glucose-Capillary: 142 mg/dL — ABNORMAL HIGH (ref 70–99)
Glucose-Capillary: 171 mg/dL — ABNORMAL HIGH (ref 70–99)

## 2022-02-19 MED ORDER — MORPHINE SULFATE (PF) 2 MG/ML IV SOLN
1.0000 mg | Freq: Four times a day (QID) | INTRAVENOUS | Status: DC | PRN
  Administered 2022-02-19: 1 mg via INTRAVENOUS
  Filled 2022-02-19: qty 1

## 2022-02-19 MED ORDER — MORPHINE SULFATE (PF) 2 MG/ML IV SOLN
2.0000 mg | INTRAVENOUS | Status: DC | PRN
  Administered 2022-02-19 – 2022-02-20 (×3): 2 mg via INTRAVENOUS
  Filled 2022-02-19 (×3): qty 1

## 2022-02-19 MED ORDER — LEVALBUTEROL HCL 0.63 MG/3ML IN NEBU: 0.6300 mg | INHALATION_SOLUTION | Freq: Four times a day (QID) | RESPIRATORY_TRACT | Status: DC | PRN

## 2022-02-19 MED ORDER — OLANZAPINE 5 MG PO TBDP
2.5000 mg | ORAL_TABLET | Freq: Every day | ORAL | Status: DC
Start: 2022-02-19 — End: 2022-02-21
  Administered 2022-02-19: 2.5 mg via ORAL

## 2022-02-19 MED ORDER — SODIUM CHLORIDE 0.9 % IV SOLN: INTRAVENOUS | Status: AC

## 2022-02-19 MED ORDER — MORPHINE SULFATE (PF) 2 MG/ML IV SOLN
1.0000 mg | Freq: Once | INTRAVENOUS | Status: AC
  Administered 2022-02-19: 1 mg via INTRAVENOUS
  Filled 2022-02-19: qty 1

## 2022-02-19 MED ORDER — WARFARIN SODIUM 2 MG PO TABS: 3.0000 mg | ORAL_TABLET | Freq: Once | ORAL | Status: DC

## 2022-02-19 NOTE — Progress Notes (Signed)
Name: [name] MRN: [id number] DOB: 1920/12/29    ADMISSION DATE:  03/06/2022 CONSULTATION DATE:  02/19/2022   REFERRING MD :  Roxan Hockey TRH  CHIEF COMPLAINT: Respiratory distress requiring oxygen   HISTORY OF PRESENT ILLNESS: 88 year old admitted with multifocal pneumonia and acute respiratory failure requiring high flow oxygen for which PCCM is consulted She was brought in by EMS with complaints of shortness of breath and chest pain that started around 2 AM on 3/8.  She reported episodes of vomiting and red sputum, she was found to be in atrial fibrillation with RVR requiring IV Cardizem drip, UA was suggestive of UTI, INR was 2.0, troponin was low, BNP 396 , serum sodium of 131 Chest x-ray showed cardiomegaly with small effusions and diffuse interstitial infiltrates. CT chest without contrast showed multiple patchy areas of consolidation in both lungs right more than left with small bilateral effusions 4.3 cm ascending thoracic aortic aneurysm was noted  -She was treated with Rocephin and azithromycin , admitted to the ICU initially requiring nonrebreather has now been weaned to 11 L high flow nasal cannula  PAST MEDICAL HISTORY :   Atrial fibrillation on Coumadin, prior history of DVT Ex-smoker, quit 1983 , 40 pack years   SUBJECTIVE: Confused overnight , requiring bilateral wrist restraints. Was down to 8 L nasal cannula but overnight back up to 15 L  VITAL SIGNS: Temp:  [97.4 F (36.3 C)-98 F (36.7 C)] 97.4 F (36.3 C) (03/10 1100) Pulse Rate:  [68-126] 126 (03/10 1300) Resp:  [15-32] 27 (03/10 1300) BP: (120-166)/(48-106) 143/106 (03/10 1300) SpO2:  [72 %-97 %] 94 % (03/10 1300) Weight:  [74.8 kg] 74.8 kg (03/10 0325)  PHYSICAL EXAMINATION: Gen. elderly, well-nourished, in mild distress,  ENT - no thrush, no post nasal drip, hard of hearing Neck: No JVD, no thyromegaly, no carotid bruits Lungs: Mild use of accessory muscles, right basal crackles,  no rhonchi Cardiovascular: Rhythm regular, heart sounds  normal, no murmurs, no peripheral edema Abdomen: soft and non-tender, no hepatosplenomegaly, BS normal. Musculoskeletal: No deformities, no cyanosis or clubbing Neuro: Confused, moves all 4 extremities Skin:  Warm, no lesions/ rash    Labs show low sodium 121, mild leukocytosis, INR 2.4   Recent Labs  Lab 02/14/2022 0646 02/18/22 0422 02/19/22 0429  NA 131* 125* 121*  K 4.2 4.5 4.1  CL 95* 93* 91*  CO2 '26 24 22  '$ BUN '13 20 21  '$ CREATININE 0.69 0.82 0.75  GLUCOSE 135* 117* 156*    Recent Labs  Lab 03/05/2022 0646 02/18/22 0422 02/19/22 0429  HGB 13.0 11.1* 11.0*  HCT 38.9 33.6* 34.3*  WBC 9.6 10.1 12.9*  PLT 172 141* 146*    ECHOCARDIOGRAM COMPLETE  Result Date: 02/18/2022    ECHOCARDIOGRAM REPORT   Patient Name:   [name] Date of Exam: 02/18/2022 Medical Rec #:  [id number]           Height:       65.0 in Accession #:    [id number]          Weight:       160.7 lb Date of Birth:  10-27-1921          BSA:          1.803 m Patient Age:    28 years            BP:           141/71 mmHg Patient Gender: F  HR:           98 bpm. Exam Location:  [facility] Procedure: 2D Echo, Cardiac Doppler and Color Doppler Indications:    Abnormal ECG  History:        Patient has prior history of Echocardiogram examinations, most                 recent 03/22/2018. Arrythmias:Atrial Fibrillation; Risk                 Factors:Hypertension and Dyslipidemia. AAA.  Sonographer:    Wenda Low Referring Phys: Wilder  1. Left ventricular ejection fraction, by estimation, is 60 to 65%. The left ventricle has normal function. The left ventricle has no regional wall motion abnormalities. There is mild left ventricular hypertrophy. Left ventricular diastolic parameters are indeterminate.  2. Right ventricular systolic function is normal. The right ventricular size is normal. There is severely elevated  pulmonary artery systolic pressure.  3. Left atrial size was severely dilated.  4. Right atrial size was moderately dilated.  5. The mitral valve is degenerative. Mild mitral valve regurgitation. No evidence of mitral stenosis. Moderate mitral annular calcification.  6. Tricuspid valve regurgitation is moderate.  7. The aortic valve is tricuspid. There is moderate calcification of the aortic valve. There is moderate thickening of the aortic valve. Aortic valve regurgitation is trivial. Moderate aortic valve stenosis.  8. The inferior vena cava is normal in size with greater than 50% respiratory variability, suggesting right atrial pressure of 3 mmHg. FINDINGS  Left Ventricle: Left ventricular ejection fraction, by estimation, is 60 to 65%. The left ventricle has normal function. The left ventricle has no regional wall motion abnormalities. The left ventricular internal cavity size was normal in size. There is  mild left ventricular hypertrophy. Left ventricular diastolic parameters are indeterminate. Right Ventricle: The right ventricular size is normal. No increase in right ventricular wall thickness. Right ventricular systolic function is normal. There is severely elevated pulmonary artery systolic pressure. The tricuspid regurgitant velocity is 3.47 m/s, and with an assumed right atrial pressure of 15 mmHg, the estimated right ventricular systolic pressure is 46.2 mmHg. Left Atrium: Left atrial size was severely dilated. Right Atrium: Right atrial size was moderately dilated. Pericardium: There is no evidence of pericardial effusion. Mitral Valve: The mitral valve is degenerative in appearance. There is moderate thickening of the mitral valve leaflet(s). There is moderate calcification of the mitral valve leaflet(s). Moderate mitral annular calcification. Mild mitral valve regurgitation. No evidence of mitral valve stenosis. MV peak gradient, 16.5 mmHg. The mean mitral valve gradient is 2.0 mmHg. Tricuspid Valve:  The tricuspid valve is normal in structure. Tricuspid valve regurgitation is moderate . No evidence of tricuspid stenosis. Aortic Valve: The aortic valve is tricuspid. There is moderate calcification of the aortic valve. There is moderate thickening of the aortic valve. Aortic valve regurgitation is trivial. Moderate aortic stenosis is present. Aortic valve mean gradient measures 12.8 mmHg. Aortic valve peak gradient measures 28.4 mmHg. Aortic valve area, by VTI measures 0.98 cm. Pulmonic Valve: The pulmonic valve was normal in structure. Pulmonic valve regurgitation is trivial. No evidence of pulmonic stenosis. Aorta: The aortic root is normal in size and structure. Venous: The inferior vena cava is normal in size with greater than 50% respiratory variability, suggesting right atrial pressure of 3 mmHg. IAS/Shunts: No atrial level shunt detected by color flow Doppler.  LEFT VENTRICLE PLAX 2D LVIDd:         4.50  cm   Diastology LVIDs:         3.30 cm   LV e' medial:    7.72 cm/s LV PW:         1.40 cm   LV E/e' medial:  24.5 LV IVS:        1.40 cm   LV e' lateral:   9.36 cm/s LVOT diam:     2.00 cm   LV E/e' lateral: 20.2 LV SV:         52 LV SV Index:   29 LVOT Area:     3.14 cm  RIGHT VENTRICLE RV Basal diam:  4.00 cm RV Mid diam:    3.00 cm RV S prime:     11.10 cm/s TAPSE (M-mode): 2.4 cm LEFT ATRIUM              Index        RIGHT ATRIUM           Index LA diam:        5.30 cm  2.94 cm/m   RA Area:     28.20 cm LA Vol (A2C):   169.0 ml 93.76 ml/m  RA Volume:   91.60 ml  50.82 ml/m LA Vol (A4C):   160.0 ml 88.76 ml/m LA Biplane Vol: 175.0 ml 97.08 ml/m  AORTIC VALVE                     PULMONIC VALVE AV Area (Vmax):    1.09 cm      PV Vmax:       0.75 m/s AV Area (Vmean):   1.17 cm      PV Peak grad:  2.2 mmHg AV Area (VTI):     0.98 cm AV Vmax:           266.25 cm/s AV Vmean:          158.500 cm/s AV VTI:            0.528 m AV Peak Grad:      28.4 mmHg AV Mean Grad:      12.8 mmHg LVOT Vmax:          92.80 cm/s LVOT Vmean:        59.000 cm/s LVOT VTI:          0.165 m LVOT/AV VTI ratio: 0.31  AORTA Ao Root diam: 3.00 cm Ao Asc diam:  3.10 cm MITRAL VALVE                TRICUSPID VALVE MV Area (PHT): 6.90 cm     TR Peak grad:   48.2 mmHg MV Area VTI:   1.22 cm     TR Vmax:        347.00 cm/s MV Peak grad:  16.5 mmHg MV Mean grad:  2.0 mmHg     SHUNTS MV Vmax:       2.03 m/s     Systemic VTI:  0.16 m MV Vmean:      53.2 cm/s    Systemic Diam: 2.00 cm MV Decel Time: 110 msec MV E velocity: 189.00 cm/s Jenkins Rouge MD Electronically signed by Jenkins Rouge MD Signature Date/Time: 02/18/2022/2:23:31 PM    Final     ASSESSMENT / PLAN:  Acute respiratory failure with hypoxia Multifocal pneumonia/community-acquired , possibility of aspiration due to history of vomiting -Urine strep antigen negative  -Continue with IV ceftriaxone -Discussed with palliative care, CODE STATUS changed to DNR -Continue high flow  oxygen, probably would not do BiPAP here  Hyponatremia -Per primary team  PCCM will be available as needed  Kara Mead MD. FCCP. [facility] Pager [phone] If no response call [phone]    02/19/2022, 1:25 PM

## 2022-02-19 NOTE — Progress Notes (Signed)
PROGRESS NOTE     [name], is a 88 y.o. female, DOB - May 20, 1921, ZOX:[id number]  Admit date - 03/10/2022   Admitting Physician Adriana Hoak Mariea Clonts, MD  Outpatient Primary MD for the patient is Adriana Ip, DO  LOS - 2  Chief Complaint  Patient presents with   Chest Pain      Brief Narrative:  88 y.o. female who is a reformed smoker with past medical history relevant for chronic A-fib, and history of DVT in the past, chronically on Coumadin for anticoagulation at baseline and Cardizem for rate control at baseline, as well as history of PAD, HTN, and HLD admitted on 03/04/2022 with Acute Hypoxic Resp failure due to bil PNA    -Assessment and Plan:   1)Bilateral multifocal Pneumonia--- chest x-ray and CTA chest report noted -c/n iv Rocephin, bronchodilators as ordered -WBC and lactic acid WNL -Stopped azithromycin, use doxycycline due to concerns for possible QT prolongation -Blood cultures NGTD -Pulmonology consult from Dr. Cyril Chambers appreciated -Strep and Legionella antigen pending -Continues to require up to 15 L of high flow nasal cannula oxygen -Repeat chest x-ray on 02/20/2022 -Pulmonology consult appreciated    2) acute hypoxic respiratory failure--- secondary to #1 above -Still  Requiring high flow oxygen -Continue bronchodilators   3) A-fib with RVR-- h/o  chronic A-fib -RVR most likely due to #1 and #2 above -Continue IV Cardizem for rate control--- currently requiring 15 mics per hour -Echo with EF of 60 to 65%, severe pulmonary hypertension, moderate aortic stenosis, moderate right atrial and severe left atrial enlargement --TSH WNL, potassium and magnesium WNL -Troponin is flat at 18 -EKG A-fib with RVR -Pharmacy consult for Coumadin management appreciated -INR remains therapeutic   4)Social/ethics--plan of care and advanced directive discussed with patient and patient's daughter Ms. [name]--- they request partial code specifically no  intubation otherwise no limitations to treatment -Official palliative care consult appreciated   5)Possible UTI--- UA with yeast treat empirically with Diflucan, antibiotics as above #1,  -It appears the urine culture may not have been sent off    6)HypoNatremia--query SIADH related to PNA compounded by Gi losses in the setting of emesis and poor oral intake due to shortness of breath -Hyponatremia is worsening with sodium down to 121 -Maintain adequate hydration/avoid dehydration -Treat underlying pneumonia    Disposition/Need for in-Hospital Stay- patient unable to be discharged at this time due to --acute hypoxic respiratory failure secondary to bilateral pneumonia requiring IV antibiotics as well as A-fib with RVR requiring IV Cardizem and high flow oxygen*   Status is: Inpatient   Remains inpatient appropriate because:    Dispo: The patient is from: Home              Anticipated d/c is to: Home with HH Vs SNF rehab              Anticipated d/c date is: > 3 days              Patient currently is not medically stable to d/c. Barriers: Not Clinically Stable-   Code Status :  -  Code Status: Partial Code   Family Communication:    (patient is alert, awake and coherent)  Discussed with daughter at bedside  DVT Prophylaxis  :   - SCDs   SCDs Start: 02/24/2022 1822 Place TED hose Start: 03/06/2022 1822 warfarin (COUMADIN) tablet 3 mg   Lab Results  Component Value Date   PLT 146 (L) 02/19/2022   Inpatient  Medications  Scheduled Meds:  atorvastatin  10 mg Oral Daily   Chlorhexidine Gluconate Cloth  6 each Topical Q0600   dextromethorphan-guaiFENesin  1 tablet Oral BID   feeding supplement  237 mL Oral TID   fluconazole  200 mg Oral Daily   levalbuterol  0.63 mg Nebulization TID   mirtazapine  7.5 mg Oral QHS   OLANZapine zydis  2.5 mg Oral QHS   olopatadine  1 drop Both Eyes BID   pantoprazole  40 mg Oral Daily   sodium chloride flush  3 mL Intravenous Q12H   sodium  chloride flush  3 mL Intravenous Q12H   warfarin  3 mg Oral ONCE-1600   Warfarin - Pharmacist Dosing Inpatient   Does not apply q1600   Continuous Infusions:  sodium chloride     sodium chloride     cefTRIAXone (ROCEPHIN)  IV 200 mL/hr at 02/19/22 0700   diltiazem (CARDIZEM) infusion 15 mg/hr (02/19/22 0700)   doxycycline (VIBRAMYCIN) IV 100 mg (02/19/22 0929)   PRN Meds:.sodium chloride, acetaminophen **OR** acetaminophen, bisacodyl, morphine injection, ondansetron **OR** ondansetron (ZOFRAN) IV, polyethylene glycol, sodium chloride flush, traZODone   Anti-infectives (From admission, onward)    Start     Dose/Rate Route Frequency Ordered Stop   02/18/22 1000  doxycycline (VIBRAMYCIN) 100 mg in sodium chloride 0.9 % 250 mL IVPB        100 mg 125 mL/hr over 120 Minutes Intravenous Every 12 hours 02/18/22 0815     02/18/22 0600  cefTRIAXone (ROCEPHIN) 2 g in sodium chloride 0.9 % 100 mL IVPB        2 g 200 mL/hr over 30 Minutes Intravenous Every 24 hours 2022-03-02 1824     03-02-2022 1915  fluconazole (DIFLUCAN) tablet 200 mg        200 mg Oral Daily Mar 02, 2022 1820 02/20/22 0959   03/02/2022 0945  cefTRIAXone (ROCEPHIN) 1 g in sodium chloride 0.9 % 100 mL IVPB        1 g 200 mL/hr over 30 Minutes Intravenous  Once 2022/03/02 0930 03-02-22 1015   March 02, 2022 0945  azithromycin (ZITHROMAX) 500 mg in sodium chloride 0.9 % 250 mL IVPB  Status:  Discontinued        500 mg 250 mL/hr over 60 Minutes Intravenous Every 24 hours 03-02-22 0930 02/18/22 0815        Subjective: [name] today has no fevers, no emesis,  No chest pain,   -Had episodes of significant confusion overnight, required restraints Daughter at bedside -Continues to require up to 15 L of high flow nasal cannula oxygen -  Objective: Vitals:   02/19/22 0600 02/19/22 0700 02/19/22 0739 02/19/22 0800  BP: 120/84 (!) 128/53  125/63  Pulse: 77 83  94  Resp: 18 (!) 27  (!) 21  Temp:   98 F (36.7 C)   TempSrc:   Oral    SpO2: 97% (!) 89% 94% (!) 81%  Weight:      Height:        Intake/Output Summary (Last 24 hours) at 02/19/2022 1029 Last data filed at 02/19/2022 0700 Gross per 24 hour  Intake 1913.01 ml  Output --  Net 1913.01 ml   Filed Weights   03/02/22 1348 02/18/22 0442 02/19/22 0325  Weight: 70.3 kg 72.9 kg 74.8 kg    Physical Exam Gen:- Awake Alert, conversational dyspnea HEENT:- [place].AT, No sclera icterus Nose- [place] HFNC Ears-HOH Neck-Supple Neck,No JVD,.  Lungs-diminished breath sounds with scattered wheezes and rhonchi CV-  S1, S2 normal, irregularly irregular, less tachycardic Abd-  +ve B.Sounds, Abd Soft, No tenderness,    Extremity/Skin:-Chronic bilateral lower extremity pitting edema, pedal pulses present  Psych-affect is somewhat anxious,, baseline/underlying cognitive and memory deficits, apparent sundowning in the evenings and nights Neuro-generalized weakness and deconditioning, no new focal deficits, no tremors  Data Reviewed: I have personally reviewed following labs and imaging studies  CBC: Recent Labs  Lab 03/10/2022 0646 02/18/22 0422 02/19/22 0429  WBC 9.6 10.1 12.9*  NEUTROABS 7.6  --   --   HGB 13.0 11.1* 11.0*  HCT 38.9 33.6* 34.3*  MCV 97.7 96.8 96.1  PLT 172 141* 146*   Basic Metabolic Panel: Recent Labs  Lab 02/27/2022 0646 03/11/2022 0853 02/18/22 0422 02/19/22 0429  NA 131*  --  125* 121*  K 4.2  --  4.5 4.1  CL 95*  --  93* 91*  CO2 26  --  24 22  GLUCOSE 135*  --  117* 156*  BUN 13  --  20 21  CREATININE 0.69  --  0.82 0.75  CALCIUM 8.7*  --  8.2* 8.4*  MG  --  1.9  --   --   PHOS  --   --   --  3.4   GFR: Estimated Creatinine Clearance: 39.7 mL/min (by C-G formula based on SCr of 0.75 mg/dL). Liver Function Tests: Recent Labs  Lab 02/11/2022 0646 02/19/22 0429  AST 21  --   ALT 17  --   ALKPHOS 93  --   BILITOT 1.0  --   PROT 6.8  --   ALBUMIN 3.5 3.0*   Cardiac Enzymes: No results for input(s): CKTOTAL, CKMB, CKMBINDEX, TROPONINI  in the last 168 hours. BNP (last 3 results) No results for input(s): PROBNP in the last 8760 hours. HbA1C: No results for input(s): HGBA1C in the last 72 hours. Sepsis Labs: @LABRCNTIP (procalcitonin:4,lacticidven:4) ) Recent Results (from the past 240 hour(s))  Resp Panel by RT-PCR (Flu A&B, Covid) Nasopharyngeal Swab     Status: None   Collection Time: 03/04/2022  7:21 AM   Specimen: Nasopharyngeal Swab; Nasopharyngeal(NP) swabs in vial transport medium  Result Value Ref Range Status   SARS Coronavirus 2 by RT PCR NEGATIVE NEGATIVE Final    Comment: (NOTE) SARS-CoV-2 target nucleic acids are NOT DETECTED.  The SARS-CoV-2 RNA is generally detectable in upper respiratory specimens during the acute phase of infection. The lowest concentration of SARS-CoV-2 viral copies this assay can detect is 138 copies/mL. A negative result does not preclude SARS-Cov-2 infection and should not be used as the sole basis for treatment or other patient management decisions. A negative result may occur with  improper specimen collection/handling, submission of specimen other than nasopharyngeal swab, presence of viral mutation(s) within the areas targeted by this assay, and inadequate number of viral copies(<138 copies/mL). A negative result must be combined with clinical observations, patient history, and epidemiological information. The expected result is Negative.  Fact Sheet for Patients:  BloggerCourse.com  Fact Sheet for Healthcare Providers:  SeriousBroker.it  This test is no t yet approved or cleared by the [place] FDA and  has been authorized for detection and/or diagnosis of SARS-CoV-2 by FDA under an Emergency Use Authorization (EUA). This EUA will remain  in effect (meaning this test can be used) for the duration of the COVID-19 declaration under Section 564(b)(1) of the Act, 21 U.S.C.section 360bbb-3(b)(1), unless the authorization  is terminated  or revoked sooner.  Influenza A by PCR NEGATIVE NEGATIVE Final   Influenza B by PCR NEGATIVE NEGATIVE Final    Comment: (NOTE) The Xpert Xpress SARS-CoV-2/FLU/RSV plus assay is intended as an aid in the diagnosis of influenza from Nasopharyngeal swab specimens and should not be used as a sole basis for treatment. Nasal washings and aspirates are unacceptable for Xpert Xpress SARS-CoV-2/FLU/RSV testing.  Fact Sheet for Patients: BloggerCourse.com  Fact Sheet for Healthcare Providers: SeriousBroker.it  This test is not yet approved or cleared by the [place] FDA and has been authorized for detection and/or diagnosis of SARS-CoV-2 by FDA under an Emergency Use Authorization (EUA). This EUA will remain in effect (meaning this test can be used) for the duration of the COVID-19 declaration under Section 564(b)(1) of the Act, 21 U.S.C. section 360bbb-3(b)(1), unless the authorization is terminated or revoked.  Performed at [facility], [address], [place], [place] [address]   Culture, blood (routine x 2)     Status: None (Preliminary result)   Collection Time: 02/19/2022  7:32 AM   Specimen: BLOOD RIGHT FOREARM  Result Value Ref Range Status   Specimen Description BLOOD RIGHT FOREARM  Final   Special Requests   Final    BOTTLES DRAWN AEROBIC AND ANAEROBIC Blood Culture adequate volume   Culture   Final    NO GROWTH 2 DAYS Performed at [facility], [address], [place], [place] [address]    Report Status PENDING  Incomplete  Culture, blood (routine x 2)     Status: None (Preliminary result)   Collection Time: 03/01/2022  7:38 AM   Specimen: Left Antecubital; Blood  Result Value Ref Range Status   Specimen Description LEFT ANTECUBITAL  Final   Special Requests   Final    BOTTLES DRAWN AEROBIC AND ANAEROBIC Blood Culture adequate volume   Culture   Final    NO GROWTH 2 DAYS Performed at [facility], [address] [place], [place] [address]    Report Status PENDING  Incomplete  MRSA Next Gen by PCR, Nasal     Status: None   Collection Time: 02/20/2022  2:00 PM   Specimen: Nasal Mucosa; Nasal Swab  Result Value Ref Range Status   MRSA by PCR Next Gen NOT DETECTED NOT DETECTED Final    Comment: (NOTE) The GeneXpert MRSA Assay (FDA approved for NASAL specimens only), is one component of a comprehensive MRSA colonization surveillance program. It is not intended to diagnose MRSA infection nor to guide or monitor treatment for MRSA infections. Test performance is not FDA approved in patients less than 56 years old. Performed at [facility], [address], [place], [place] [address]     Radiology Studies: ECHOCARDIOGRAM COMPLETE  Result Date: 02/18/2022    ECHOCARDIOGRAM REPORT   Patient Name:   LIORA AMRINE [facility] Date of Exam: 02/18/2022 Medical Rec #:  [id number]           Height:       65.0 in Accession #:    [id number]          Weight:       160.7 lb Date of Birth:  [birth year]          BSA:          1.803 m Patient Age:    89 years            BP:           141/71 mmHg Patient Gender: F  HR:           98 bpm. [facility] Procedure: 2D Echo, Cardiac Doppler and Color Doppler Indications:    Abnormal ECG  History:        Patient has prior history of Echocardiogram examinations, most                 recent 03/22/2018. Arrythmias:Atrial Fibrillation; Risk                 Factors:Hypertension and Dyslipidemia. AAA.  Sonographer:    Mikki Harbor Referring Phys: [id number] Mahamed Zalewski IMPRESSIONS  1. Left ventricular ejection fraction, by estimation, is 60 to 65%. The left ventricle has normal function. The left ventricle has no regional wall motion abnormalities. There is mild left ventricular hypertrophy. Left ventricular diastolic parameters are indeterminate.  2. Right ventricular systolic function is normal. The right ventricular size is normal. There is severely  elevated pulmonary artery systolic pressure.  3. Left atrial size was severely dilated.  4. Right atrial size was moderately dilated.  5. The mitral valve is degenerative. Mild mitral valve regurgitation. No evidence of mitral stenosis. Moderate mitral annular calcification.  6. Tricuspid valve regurgitation is moderate.  7. The aortic valve is tricuspid. There is moderate calcification of the aortic valve. There is moderate thickening of the aortic valve. Aortic valve regurgitation is trivial. Moderate aortic valve stenosis.  8. The inferior vena cava is normal in size with greater than 50% respiratory variability, suggesting right atrial pressure of 3 mmHg. FINDINGS  Left Ventricle: Left ventricular ejection fraction, by estimation, is 60 to 65%. The left ventricle has normal function. The left ventricle has no regional wall motion abnormalities. The left ventricular internal cavity size was normal in size. There is  mild left ventricular hypertrophy. Left ventricular diastolic parameters are indeterminate. Right Ventricle: The right ventricular size is normal. No increase in right ventricular wall thickness. Right ventricular systolic function is normal. There is severely elevated pulmonary artery systolic pressure. The tricuspid regurgitant velocity is 3.47 m/s, and with an assumed right atrial pressure of 15 mmHg, the estimated right ventricular systolic pressure is 63.2 mmHg. Left Atrium: Left atrial size was severely dilated. Right Atrium: Right atrial size was moderately dilated. Pericardium: There is no evidence of pericardial effusion. Mitral Valve: The mitral valve is degenerative in appearance. There is moderate thickening of the mitral valve leaflet(s). There is moderate calcification of the mitral valve leaflet(s). Moderate mitral annular calcification. Mild mitral valve regurgitation. No evidence of mitral valve stenosis. MV peak gradient, 16.5 mmHg. The mean mitral valve gradient is 2.0 mmHg.  Tricuspid Valve: The tricuspid valve is normal in structure. Tricuspid valve regurgitation is moderate . No evidence of tricuspid stenosis. Aortic Valve: The aortic valve is tricuspid. There is moderate calcification of the aortic valve. There is moderate thickening of the aortic valve. Aortic valve regurgitation is trivial. Moderate aortic stenosis is present. Aortic valve mean gradient measures 12.8 mmHg. Aortic valve peak gradient measures 28.4 mmHg. Aortic valve area, by VTI measures 0.98 cm. Pulmonic Valve: The pulmonic valve was normal in structure. Pulmonic valve regurgitation is trivial. No evidence of pulmonic stenosis. Aorta: The aortic root is normal in size and structure. Venous: The inferior vena cava is normal in size with greater than 50% respiratory variability, suggesting right atrial pressure of 3 mmHg. IAS/Shunts: No atrial level shunt detected by color flow Doppler.  LEFT VENTRICLE PLAX 2D LVIDd:         4.50  cm   Diastology LVIDs:         3.30 cm   LV e' medial:    7.72 cm/s LV PW:         1.40 cm   LV E/e' medial:  24.5 LV IVS:        1.40 cm   LV e' lateral:   9.36 cm/s LVOT diam:     2.00 cm   LV E/e' lateral: 20.2 LV SV:         52 LV SV Index:   29 LVOT Area:     3.14 cm  RIGHT VENTRICLE RV Basal diam:  4.00 cm RV Mid diam:    3.00 cm RV S prime:     11.10 cm/s TAPSE (M-mode): 2.4 cm LEFT ATRIUM              Index        RIGHT ATRIUM           Index LA diam:        5.30 cm  2.94 cm/m   RA Area:     28.20 cm LA Vol (A2C):   169.0 ml 93.76 ml/m  RA Volume:   91.60 ml  50.82 ml/m LA Vol (A4C):   160.0 ml 88.76 ml/m LA Biplane Vol: 175.0 ml 97.08 ml/m  AORTIC VALVE                     PULMONIC VALVE AV Area (Vmax):    1.09 cm      PV Vmax:       0.75 m/s AV Area (Vmean):   1.17 cm      PV Peak grad:  2.2 mmHg AV Area (VTI):     0.98 cm AV Vmax:           266.25 cm/s AV Vmean:          158.500 cm/s AV VTI:            0.528 m AV Peak Grad:      28.4 mmHg AV Mean Grad:      12.8 mmHg  LVOT Vmax:         92.80 cm/s LVOT Vmean:        59.000 cm/s LVOT VTI:          0.165 m LVOT/AV VTI ratio: 0.31  AORTA Ao Root diam: 3.00 cm Ao Asc diam:  3.10 cm MITRAL VALVE                TRICUSPID VALVE MV Area (PHT): 6.90 cm     TR Peak grad:   48.2 mmHg MV Area VTI:   1.22 cm     TR Vmax:        347.00 cm/s MV Peak grad:  16.5 mmHg MV Mean grad:  2.0 mmHg     SHUNTS MV Vmax:       2.03 m/s     Systemic VTI:  0.16 m MV Vmean:      53.2 cm/s    Systemic Diam: 2.00 cm MV Decel Time: 110 msec MV E velocity: 189.00 cm/s Charlton Haws MD Electronically signed by Charlton Haws MD Signature Date/Time: 02/18/2022/2:23:31 PM    Final      Scheduled Meds:  atorvastatin  10 mg Oral Daily   Chlorhexidine Gluconate Cloth  6 each Topical Q0600   dextromethorphan-guaiFENesin  1 tablet Oral BID   feeding supplement  237 mL Oral TID   fluconazole  200 mg Oral Daily   levalbuterol  0.63 mg Nebulization TID   mirtazapine  7.5 mg Oral QHS   OLANZapine zydis  2.5 mg Oral QHS   olopatadine  1 drop Both Eyes BID   pantoprazole  40 mg Oral Daily   sodium chloride flush  3 mL Intravenous Q12H   sodium chloride flush  3 mL Intravenous Q12H   warfarin  3 mg Oral ONCE-1600   Warfarin - Pharmacist Dosing Inpatient   Does not apply q1600   Continuous Infusions:  sodium chloride     sodium chloride     cefTRIAXone (ROCEPHIN)  IV 200 mL/hr at 02/19/22 0700   diltiazem (CARDIZEM) infusion 15 mg/hr (02/19/22 0700)   doxycycline (VIBRAMYCIN) IV 100 mg (02/19/22 0929)     LOS: 2 days   Shon Hale M.D on 02/19/2022 at 10:29 AM  Go to [email or web address] - for contact info  [facility] - Office  [phone]  If 7PM-7AM, please contact night-coverage [email or web address] Password [facility] 02/19/2022, 10:29 AM

## 2022-02-19 NOTE — Progress Notes (Signed)
Speech Language Pathology Treatment: Dysphagia  ?Patient Details ?Name: [name] ?MRN: [id number] ?DOB: 06-Feb-1921 ?Today's Date: 02/19/2022 ?Time: 8546-2703 ?SLP Time Calculation (min) (ACUTE ONLY): 28 min ? ?Assessment / Plan / Recommendation ?Clinical Impression ? Pt was provided ongoing diagnostic dysphagia treatment this am; Pt's daughter was present for treatment. Pt was responsive, alert and pleasantly confused. She was not oriented to place but discussed her hometown and local food places in town with full understanding and awareness. Pt was frustrated and confused by restraint on RUE. SLP distracted Pt from this and provided PO trials (ginger ale and graham crackers). Pt consumed sips via straw and bites of cracker without overt s/sx of aspiration. She did benefit from cues to breathe "smell the flowers, blow out the candles", take her time and alternate bites and sips. RN reports Pt took pills whole with liquid without incident this am. ST will continue to follow acutely. Recommend continue with current diet. Echo BSE report noting that Pt is at risk for aspiration secondary to respiratory compromise and increased work of breathing with PO intake. ST will continue to follow acutely.  ?  ?HPI HPI: 88 year old admitted with multifocal pneumonia and acute respiratory failure requiring high flow oxygen for which PCCM is consulted  She was brought in by EMS with complaints of shortness of breath and chest pain that started around 2 AM on 3/8.  She reported episodes of vomiting and red sputum, she was found to be in atrial fibrillation with RVR requiring IV Cardizem drip, UA was suggestive of UTI, INR was 2.0, troponin was low, BNP 396 , serum sodium of 131  Chest x-ray showed cardiomegaly with small effusions and diffuse interstitial infiltrates.  CT chest without contrast showed multiple patchy areas of consolidation in both lungs right more than left with small bilateral effusions  4.3 cm ascending  thoracic aortic aneurysm was noted     -She was treated with Rocephin and azithromycin , admitted to the ICU initially requiring nonrebreather has now been weaned to 11 L high flow nasal cannula. ?  ?   ?SLP Plan ? Continue with current plan of care ? ?  ?  ?Recommendations for follow up therapy are one component of a multi-disciplinary discharge planning process, led by the attending physician.  Recommendations may be updated based on patient status, additional functional criteria and insurance authorization. ?  ? ?Recommendations  ?Diet recommendations: Regular;Thin liquid ?Liquids provided via: Straw ?Medication Administration: Whole meds with liquid ?Supervision: Patient able to self feed;Intermittent supervision to cue for compensatory strategies;Staff to assist with self feeding ?Compensations: Small sips/bites;Slow rate;Multiple dry swallows after each bite/sip  ?   ?    ?   ? ? ? ? Oral Care Recommendations: Oral care BID;Staff/trained caregiver to provide oral care ?Assistance recommended at discharge: Frequent or constant Supervision/Assistance ?SLP Visit Diagnosis: Dysphagia, unspecified (R13.10) ?Plan: Continue with current plan of care ? ? ? ? ?  ?  ? ? H. Izora Ribas MA, CCC-SLP ?Speech Language Pathologist ? ?Wende Bushy ? ?02/19/2022, 10:13 AM ?

## 2022-02-19 NOTE — Progress Notes (Signed)
? ?                                                                                                                                                     ?                                                   ?Daily Progress Note  ? ?Patient Name: [name]       Date: 02/19/2022 ?DOB: 1921/09/15  Age: 88 y.o. MRN#: [id number] ?Attending Physician: Roxan Hockey, MD ?Primary Care Physician: Janora Norlander, DO ?Admit Date: 02/28/2022 ? ?Reason for Consultation/Follow-up: Establishing goals of care ? ?Patient Profile/HPI:   ? 88 y.o. female  with past medical history of chronic a-fib on coumadin, DVT,  admitted on 02/16/2022 with aspiration pnuemonia and a-fib with RVR. Palliative medicine consutled for goals of care.  ? ?Subjective: ?Chart reviewed including progress notes labs and imaging.  Noted that she had some confusion last night. ?Her daughter [name] is at bedside-Cindy relates to difficulty of the night, I encouraged [name] to go home and get some rest.  I asked [name] to confirm that her Sister [name] is the healthcare power of attorney, however [name] stated that she believes she is also healthcare power of attorney as well and they serve together. ?This morning.was pleasantly confused, speech therapy at bedside conducting eval. ?I called [name] to discuss HCPOA-[name] states that she is the first HCPOA than her brother and then her sister.  I requested that [name] provide a copy of the [facility] POA so that we can confirm. ?Visited.later in the day and she had significant distress with air hunger she is on nonrebreather and 15 L high flow oxygen she is not responding to current treatments.  Dr. Buddy Duty is at the bedside also explaining this to daughter [name].  Had prolonged discussion with [name] regarding advanced care planning and goals of care. ?Discussed that daughter is very ill and it is possible she will not recover. ?[name] agrees to DO NOT RESUSCITATE order.  If her mom were to worsen she also would  not want her mom to have BiPAP. ?Continue current efforts for the next 48 hours, however if patient worsens or fails to improve then may transition to full comfort measures only.  [name] would also like to implement some level of comfort to limit her mom suffering she is agreeable to continuing as needed morphine for shortness of breath and air hunger. ?Also discussed using Zyprexa tonight to limit sundowning. ? ? ?Review of Systems  ?Unable to perform ROS: Acuity of condition  ? ? ?Physical Exam ?Vitals and nursing note reviewed.  ?Constitutional:   ?  Appearance: She is ill-appearing.  ?Pulmonary:  ?   Effort: Respiratory distress present.  ?Neurological:  ?   Mental Status: She is alert. She is disoriented.  ?Psychiatric:  ?   Comments: Pleasantly confused  ?         ? ?Vital Signs: BP 125/63 (BP Location: Right Arm)   Pulse 94   Temp 98 ?F (36.7 ?C) (Oral)   Resp (!) 21   Ht '5\' 5"'$  (1.651 m)   Wt 74.8 kg   SpO2 (!) 81%   BMI 27.44 kg/m?  ?SpO2: SpO2: (!) 81 % ?O2 Device: O2 Device: High Flow Nasal Cannula ?O2 Flow Rate: O2 Flow Rate (L/min): 15 L/min ? ?Intake/output summary:  ?Intake/Output Summary (Last 24 hours) at 02/19/2022 1027 ?Last data filed at 02/19/2022 0700 ?Gross per 24 hour  ?Intake 1913.01 ml  ?Output --  ?Net 1913.01 ml  ? ?LBM: Last BM Date : 02/16/22 ?Baseline Weight: Weight: 63.5 kg ?Most recent weight: Weight: 74.8 kg ? ?     ?Palliative Assessment/Data: PPS 10% ? ? ? ? ? ?Patient Active Problem List  ? Diagnosis Date Noted  ? Community acquired pneumonia, bilateral--??Aspiration 02/18/2022  ? Atrial fibrillation with RVR ([place]) 02/20/2022  ? Acute respiratory failure with hypoxia ([place]) 02/18/2022  ? Long term (current) use of anticoagulants 02/04/2022  ? Precordial pain 08/30/2018  ? Reactive airway disease with acute exacerbation 01/28/2018  ? Left ventricular diastolic dysfunction, NYHA class 2 12/21/2013  ? Non-rheumatic aortic sclerosis 12/21/2013  ? AAA (abdominal aortic aneurysm)  12/21/2013  ? Abdominal aortic aneurysm 12/21/2013  ? 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 Atrial fibrillation ([place])   ? Edema of both legs   ? 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 Chronic anticoagulation 05/10/2012  ? PVD, moderate bilat carotid disease 05/10/2012  ? Dyslipidemia 05/10/2012  ? Essential hypertension   ? GERD (gastroesophageal reflux disease)   ? Bladder cancer ([place])   ? Rheumatoid arthritis(714.0)   ? Primary bladder malignant neoplasm ([place]) 11/10/2011  ? COLONIC POLYPS, ADENOMATOUS 01/26/2008  ? HEMORRHOIDS, EXTERNAL 01/26/2008  ? GASTRITIS, CHRONIC 01/26/2008  ? HIATAL HERNIA 01/26/2008  ? DIVERTICULOSIS OF COLON 01/26/2008  ? Atrophic gastritis 01/26/2008  ? ? ?Palliative Care Assessment & Plan  ? ? ?Assessment/Recommendations/Plan ? ?Acute respiratory failure with hypoxia due to multifocal pneumonia-continue current interventions however if she worsens or fails to improve over the next 24 to 48 hours then transition to full comfort measures only, no BiPAP-I am unfortunately highly suspicious that she will deteriorate within the next 24 hours ?CODE STATUS changed to DO NOT RESUSCITATE ?[name] was able to provide a copy of the [facility] POA and living will-[name] is the first [place], her brother Simona Huh is the second HCPOA, her Sister Caren Griffins is the third HCPOA, all to act independently and in that order I have placed a copy on the patient's paper chart ?Morphine 2 mg IV every 4 hours as needed for shortness of breath or increased work of breathing ?Zyprexa 2.5 mg orally disintegrating tablet nightly ? ? ?Code Status: ?DNR ? ?Prognosis: ? Unable to determine she is not showing signs of improvement I am concerned that she may be limited to days ? ?Discharge Planning: ?To Be Determined ? ?Care plan was discussed with patient's family and care team ? ?Thank you for allowing the Palliative Medicine Team to assist in the care of this patient. ? ?Total time:  180 minutes ? ?Mariana Kaufman, AGNP-C ?Palliative  Medicine ? ? ?Please contact Palliative Medicine Team  phone at [phone] for questions and concerns.  ? ? ? ? ? ? ?

## 2022-02-19 NOTE — Progress Notes (Signed)
Over night pt increasing in confusion. Pt pulled out Ivs, removing medical equipment, refusing to put on oxygen mask, and trying to get out of bed. Attempted to reorient pt and educate the importance of getting her oxygen mask on. Her O2 was at 71%. She continued to refuse and repeatedly pulled off oxygen.  ? ?Provider notified. Order placed for bilateral wrist restraints. Pt's vitals are improving, O2 92%. Daughter, [name], at bedside and updated with pt's status. She stated that the pt has never been this high level of confusion.  ?

## 2022-02-19 NOTE — Progress Notes (Signed)
ANTICOAGULATION CONSULT NOTE -  ? ?Pharmacy Consult for warfarin ?Indication: atrial fibrillation ? ?Allergies  ?Allergen Reactions  ? Contrast Media [Iodinated Contrast Media] Hives  ? Amoxicillin Itching  ? Ampicillin Itching  ? Cefdinir Nausea And Vomiting  ? Cortisol [Hydrocortisone] Itching  ? Ivp Dye [Iodinated Contrast Media] Hives  ? Levaquin [Levofloxacin] Itching and Rash  ? Penicillins Rash  ?  "haven't had it in years"  ? Vancomycin Itching and Rash  ? ? ?Patient Measurements: ?Height: '5\' 5"'$  (165.1 cm) ?Weight: 74.8 kg (164 lb 14.5 oz) ?IBW/kg (Calculated) : 57 ?Heparin Dosing Weight:  ? ?Vital Signs: ?Temp: 98 ?F (36.7 ?C) (03/10 0739) ?Temp Source: Oral (03/10 0739) ?BP: 128/53 (03/10 0700) ?Pulse Rate: 83 (03/10 0700) ? ?Labs: ?Recent Labs  ?  02/15/2022 ?0646 03/05/2022 ?0656 03/03/2022 ?0853 02/18/22 ?0422 02/19/22 ?0429  ?HGB 13.0  --   --  11.1* 11.0*  ?HCT 38.9  --   --  33.6* 34.3*  ?PLT 172  --   --  141* 146*  ?LABPROT  --  22.4*  --  25.3* 25.7*  ?INR  --  2.0*  --  2.3* 2.4*  ?CREATININE 0.69  --   --  0.82 0.75  ?TROPONINIHS 18*  --  18*  --   --   ? ? ? ?Estimated Creatinine Clearance: 39.7 mL/min (by C-G formula based on SCr of 0.75 mg/dL). ? ? ?Medical History: ?Past Medical History:  ?Diagnosis Date  ? AAA (abdominal aortic aneurysm)   ? Allergy   ? Anxiety   ? Bladder cancer ([place]) dx'd 2011  ? Chronic microscopic hematuria; transitional cell cancer  ? Blood transfusion 1960  ? "related to hysterectomy"  ? Chronic atrial fibrillation (HCC)   ? Anticoagulated with warfarin, rate control with diltiazem and Toprol  ? Chronic back pain   ? Chronic headache   ? "probably 2/wk" (08/28/2015)  ? Concussion w/o coma 03/31/2004  ? Complicated by subarachnoid hemorrhage.  "even now has times when she's not able to comprehend" (05/08/12)  ? Coronary artery disease   ? Nonobstructive  ? Diverticulitis   ? Diverticulosis   ? DVT (deep venous thrombosis), right 2005  ? "right calf after 8 foot fall"  ? Edema  of both legs   ? Chronic, thought to be secondary to DVTs  ? External hemorrhoids   ? Family history of adverse reaction to anesthesia 2012  ? daughter "had OR for crushed hand; had problems w/anesthesia & I was in there all day long" (  ? Fatty liver   ? Frequent UTI   ? GERD (gastroesophageal reflux disease)   ? H/O hiatal hernia   ? High cholesterol   ? Migraine   ? "rare now" (08/28/2015)  ? Pneumonia ~ 2010;  2013; 07/2015  ? Rheumatoid arthritis(714.0)   ? "hands" (08/28/2015)  ? Urinary frequency   ? ? ?Medications:  ?Medications Prior to Admission  ?Medication Sig Dispense Refill Last Dose  ? acetaminophen (TYLENOL) 500 MG tablet Take 500 mg by mouth every 6 (six) hours as needed for mild pain or moderate pain.   unk  ? atorvastatin (LIPITOR) 10 MG tablet TAKE 1 TABLET BY MOUTH EVERY DAY 90 tablet 0 02/16/2022  ? calcium carbonate (OS-CAL - DOSED IN MG OF ELEMENTAL CALCIUM) 1250 MG tablet Take 1 tablet by mouth every morning.    02/16/2022  ? cholecalciferol (VITAMIN D) 1000 UNITS tablet Take 1,000 Units by mouth daily.   02/16/2022  ? DILT-XR  240 MG 24 hr capsule TAKE 1 CAPSULE BY MOUTH EVERY MORNING ON AN EMPTY STOMACH 90 capsule 0 02/16/2022  ? furosemide (LASIX) 20 MG tablet TAKE ONE TO TWO TABLETS EVERY DAY AS INSTRUCTED 180 tablet 1 02/16/2022  ? loratadine (CLARITIN) 10 MG tablet TAKE 1 TABLET (10 MG TOTAL) BY MOUTH DAILY AS NEEDED FOR ALLERGIES OR ITCHING. 90 tablet 1 02/16/2022  ? Multiple Vitamins-Minerals (PRESERVISION AREDS 2+MULTI VIT PO) Take by mouth.   02/16/2022  ? olopatadine (PATANOL) 0.1 % ophthalmic solution PLACE 1 DROP IN AFFECTED EYE(S) TWICE DAILY (Patient taking differently: Place 1 drop into both eyes 2 (two) times daily.) 15 mL 5 02/16/2022  ? Omega-3 Fatty Acids (FISH OIL) 1000 MG CAPS Take 1,000 mg by mouth daily.   02/16/2022  ? omeprazole (PRILOSEC) 20 MG capsule TAKE 1 CAPSULE BY MOUTH EVERY DAY 90 capsule 1 02/16/2022  ? ondansetron (ZOFRAN-ODT) 4 MG disintegrating tablet TAKE 1 TABLET BY MOUTH  EVERY 8 HOURS AS NEEDED FOR NAUSEA 10 tablet 1 Past Week  ? Vibegron (GEMTESA) 75 MG TABS Take 1 tablet by mouth daily. 30 tablet 3 02/16/2022  ? warfarin (COUMADIN) 3 MG tablet TAKE 1 TABLET BY MOUTH EVERY DAY EXCEPT TAKE 2 ON MONDAY, WEDNESDAY, FRIDAY, AND SATURDAY 140 tablet 2 02/16/2022 at 0900  ? baclofen (LIORESAL) 10 MG tablet Take 1 tablet (10 mg total) by mouth 3 (three) times daily. (Patient not taking: Reported on 02/12/2022) 60 each 0 Not Taking  ? mirtazapine (REMERON) 7.5 MG tablet TAKE 1 TABLET BY MOUTH EVERYDAY AT BEDTIME (Patient not taking: Reported on 02/15/2022) 90 tablet 0 Not Taking  ? Misc. Devices (TOILET SAFETY FRAME) MISC UAD 1 each 0   ? PROAIR HFA 108 (90 Base) MCG/ACT inhaler TAKE 2 PUFFS BY MOUTH EVERY 6 HOURS AS NEEDED FOR WHEEZE OR SHORTNESS OF BREATH (Patient not taking: Reported on 02/11/2022) 8.5 Inhaler 4 Not Taking  ? ? ?Assessment: ?Pharmacy consulted to dose warfarin in patient with atrial fibrillation.  Patient's home dose listed as 6 mg MWF and 3 mg ROW.  Patient missed 3/8 dose due to being NPO. ? ?Patient on fluconazole- consider dose reduction of warfarin during use ? ?INR 2.4 ?CBC WNL ? ?Goal of Therapy:  ?INR 2-3 ?Monitor platelets by anticoagulation protocol: Yes ?  ?Plan:  ?Warfarin 3 mg today. ?Monitor daily INR and s/s of bleeding. ? ?Margot Ables, PharmD ?Clinical Pharmacist ?02/19/2022 8:02 AM ? ? ? ?

## 2022-02-20 ENCOUNTER — Inpatient Hospital Stay (HOSPITAL_COMMUNITY): Payer: PPO

## 2022-02-20 DIAGNOSIS — J9601 Acute respiratory failure with hypoxia: Secondary | ICD-10-CM | POA: Diagnosis not present

## 2022-02-20 LAB — PROTIME-INR
INR: 2.5 — ABNORMAL HIGH (ref 0.8–1.2)
Prothrombin Time: 26.9 seconds — ABNORMAL HIGH (ref 11.4–15.2)

## 2022-02-20 LAB — CBC
HCT: 34.4 % — ABNORMAL LOW (ref 36.0–46.0)
Hemoglobin: 11.3 g/dL — ABNORMAL LOW (ref 12.0–15.0)
MCH: 32 pg (ref 26.0–34.0)
MCHC: 32.8 g/dL (ref 30.0–36.0)
MCV: 97.5 fL (ref 80.0–100.0)
Platelets: 159 10*3/uL (ref 150–400)
RBC: 3.53 MIL/uL — ABNORMAL LOW (ref 3.87–5.11)
RDW: 13.2 % (ref 11.5–15.5)
WBC: 13.7 10*3/uL — ABNORMAL HIGH (ref 4.0–10.5)
nRBC: 0 % (ref 0.0–0.2)

## 2022-02-20 LAB — BASIC METABOLIC PANEL
Anion gap: 9 (ref 5–15)
BUN: 27 mg/dL — ABNORMAL HIGH (ref 8–23)
CO2: 20 mmol/L — ABNORMAL LOW (ref 22–32)
Calcium: 8.9 mg/dL (ref 8.9–10.3)
Chloride: 94 mmol/L — ABNORMAL LOW (ref 98–111)
Creatinine, Ser: 0.82 mg/dL (ref 0.44–1.00)
GFR, Estimated: >60 mL/min (ref >60)
Glucose, Bld: 142 mg/dL — ABNORMAL HIGH (ref 70–99)
Potassium: 4.8 mmol/L (ref 3.5–5.1)
Sodium: 123 mmol/L — ABNORMAL LOW (ref 135–145)

## 2022-02-20 MED ORDER — MORPHINE SULFATE (PF) 2 MG/ML IV SOLN
2.0000 mg | INTRAVENOUS | Status: DC | PRN
  Administered 2022-02-20 – 2022-02-21 (×4): 2 mg via INTRAVENOUS
  Filled 2022-02-20 (×5): qty 1

## 2022-02-20 MED ORDER — LORAZEPAM 2 MG/ML IJ SOLN
INTRAMUSCULAR | Status: AC
  Administered 2022-02-20: 1 mg via INTRAVENOUS
  Filled 2022-02-20: qty 1

## 2022-02-20 MED ORDER — LORAZEPAM 2 MG/ML IJ SOLN
1.0000 mg | Freq: Four times a day (QID) | INTRAMUSCULAR | Status: DC | PRN
  Administered 2022-02-21: 1 mg via INTRAVENOUS
  Filled 2022-02-20: qty 1

## 2022-02-20 MED ORDER — WARFARIN SODIUM 2 MG PO TABS: 3.0000 mg | ORAL_TABLET | Freq: Once | ORAL | Status: DC

## 2022-02-20 NOTE — Progress Notes (Addendum)
ANTICOAGULATION CONSULT NOTE -  ? ?Pharmacy Consult for warfarin ?Indication: atrial fibrillation ? ?Allergies  ?Allergen Reactions  ? Contrast Media [Iodinated Contrast Media] Hives  ? Amoxicillin Itching  ? Ampicillin Itching  ? Cefdinir Nausea And Vomiting  ? Cortisol [Hydrocortisone] Itching  ? Ivp Dye [Iodinated Contrast Media] Hives  ? Levaquin [Levofloxacin] Itching and Rash  ? Penicillins Rash  ?  "haven't had it in years"  ? Vancomycin Itching and Rash  ? ? ?Patient Measurements: ?Height: '5\' 5"'$  (165.1 cm) ?Weight: 74.8 kg (164 lb 14.5 oz) ?IBW/kg (Calculated) : 57 ?Heparin Dosing Weight:  ? ?Vital Signs: ?BP: 143/61 (03/11 0700) ?Pulse Rate: 96 (03/11 0700) ? ?Labs: ?Recent Labs  ?  02/12/2022 ?0853 02/18/22 ?0422 02/18/22 ?0422 02/19/22 ?0429 02/20/22 ?1601  ?HGB  --  11.1*   < > 11.0* 11.3*  ?HCT  --  33.6*  --  34.3* 34.4*  ?PLT  --  141*  --  146* 159  ?LABPROT  --  25.3*  --  25.7* 26.9*  ?INR  --  2.3*  --  2.4* 2.5*  ?CREATININE  --  0.82  --  0.75 0.82  ?TROPONINIHS 18*  --   --   --   --   ? < > = values in this interval not displayed.  ? ? ? ?Estimated Creatinine Clearance: 38.8 mL/min (by C-G formula based on SCr of 0.82 mg/dL). ? ? ?Medical History: ?Past Medical History:  ?Diagnosis Date  ? AAA (abdominal aortic aneurysm)   ? Allergy   ? Anxiety   ? Bladder cancer ([place]) dx'd 2011  ? Chronic microscopic hematuria; transitional cell cancer  ? Blood transfusion 1960  ? "related to hysterectomy"  ? Chronic atrial fibrillation (HCC)   ? Anticoagulated with warfarin, rate control with diltiazem and Toprol  ? Chronic back pain   ? Chronic headache   ? "probably 2/wk" (08/28/2015)  ? Concussion w/o coma 03/31/2004  ? Complicated by subarachnoid hemorrhage.  "even now has times when she's not able to comprehend" (05/08/12)  ? Coronary artery disease   ? Nonobstructive  ? Diverticulitis   ? Diverticulosis   ? DVT (deep venous thrombosis), right 2005  ? "right calf after 8 foot fall"  ? Edema of both legs   ?  Chronic, thought to be secondary to DVTs  ? External hemorrhoids   ? Family history of adverse reaction to anesthesia 2012  ? daughter "had OR for crushed hand; had problems w/anesthesia & I was in there all day long" (  ? Fatty liver   ? Frequent UTI   ? GERD (gastroesophageal reflux disease)   ? H/O hiatal hernia   ? High cholesterol   ? Migraine   ? "rare now" (08/28/2015)  ? Pneumonia ~ 2010;  2013; 07/2015  ? Rheumatoid arthritis(714.0)   ? "hands" (08/28/2015)  ? Urinary frequency   ? ? ?Medications:  ?Medications Prior to Admission  ?Medication Sig Dispense Refill Last Dose  ? acetaminophen (TYLENOL) 500 MG tablet Take 500 mg by mouth every 6 (six) hours as needed for mild pain or moderate pain.   unk  ? atorvastatin (LIPITOR) 10 MG tablet TAKE 1 TABLET BY MOUTH EVERY DAY 90 tablet 0 02/16/2022  ? calcium carbonate (OS-CAL - DOSED IN MG OF ELEMENTAL CALCIUM) 1250 MG tablet Take 1 tablet by mouth every morning.    02/16/2022  ? cholecalciferol (VITAMIN D) 1000 UNITS tablet Take 1,000 Units by mouth daily.   02/16/2022  ?  DILT-XR 240 MG 24 hr capsule TAKE 1 CAPSULE BY MOUTH EVERY MORNING ON AN EMPTY STOMACH 90 capsule 0 02/16/2022  ? furosemide (LASIX) 20 MG tablet TAKE ONE TO TWO TABLETS EVERY DAY AS INSTRUCTED 180 tablet 1 02/16/2022  ? loratadine (CLARITIN) 10 MG tablet TAKE 1 TABLET (10 MG TOTAL) BY MOUTH DAILY AS NEEDED FOR ALLERGIES OR ITCHING. 90 tablet 1 02/16/2022  ? Multiple Vitamins-Minerals (PRESERVISION AREDS 2+MULTI VIT PO) Take by mouth.   02/16/2022  ? olopatadine (PATANOL) 0.1 % ophthalmic solution PLACE 1 DROP IN AFFECTED EYE(S) TWICE DAILY (Patient taking differently: Place 1 drop into both eyes 2 (two) times daily.) 15 mL 5 02/16/2022  ? Omega-3 Fatty Acids (FISH OIL) 1000 MG CAPS Take 1,000 mg by mouth daily.   02/16/2022  ? omeprazole (PRILOSEC) 20 MG capsule TAKE 1 CAPSULE BY MOUTH EVERY DAY 90 capsule 1 02/16/2022  ? ondansetron (ZOFRAN-ODT) 4 MG disintegrating tablet TAKE 1 TABLET BY MOUTH EVERY 8 HOURS AS  NEEDED FOR NAUSEA 10 tablet 1 Past Week  ? Vibegron (GEMTESA) 75 MG TABS Take 1 tablet by mouth daily. 30 tablet 3 02/16/2022  ? warfarin (COUMADIN) 3 MG tablet TAKE 1 TABLET BY MOUTH EVERY DAY EXCEPT TAKE 2 ON MONDAY, WEDNESDAY, FRIDAY, AND SATURDAY 140 tablet 2 02/16/2022 at 0900  ? baclofen (LIORESAL) 10 MG tablet Take 1 tablet (10 mg total) by mouth 3 (three) times daily. (Patient not taking: Reported on 03/09/2022) 60 each 0 Not Taking  ? mirtazapine (REMERON) 7.5 MG tablet TAKE 1 TABLET BY MOUTH EVERYDAY AT BEDTIME (Patient not taking: Reported on 02/22/2022) 90 tablet 0 Not Taking  ? Misc. Devices (TOILET SAFETY FRAME) MISC UAD 1 each 0   ? PROAIR HFA 108 (90 Base) MCG/ACT inhaler TAKE 2 PUFFS BY MOUTH EVERY 6 HOURS AS NEEDED FOR WHEEZE OR SHORTNESS OF BREATH (Patient not taking: Reported on 02/19/2022) 8.5 Inhaler 4 Not Taking  ? ? ?Assessment: ?Pharmacy consulted to dose warfarin in patient with atrial fibrillation.  Patient's home dose listed as 6 mg MWF and 3 mg ROW.  Patient missed 3/8 dose due to being NPO. ? ?Patient on fluconazole- consider dose reduction of warfarin during use ? ?INR 2.4>2.5 ?CBC WNL ? ?3/11: patient did not receive 3/10 dose ? ?Goal of Therapy:  ?INR 2-3 ?Monitor platelets by anticoagulation protocol: Yes ?  ?Plan:  ?Warfarin 3 mg today. ?Monitor daily INR and s/s of bleeding. ? ?Margot Ables, PharmD ?Clinical Pharmacist ?02/20/2022 8:20 AM ? ? ? ?

## 2022-02-20 NOTE — Progress Notes (Signed)
Lab Results  Component Value Date   PLT 159 19-Mar-2022   Inpatient Medications  Scheduled Meds:  Chlorhexidine Gluconate Cloth  6 each Topical Q0600   feeding supplement  237 mL Oral TID   mirtazapine  7.5 mg Oral QHS   OLANZapine zydis  2.5 mg Oral QHS   olopatadine  1 drop Both Eyes BID   sodium chloride flush  3 mL Intravenous Q12H   sodium chloride flush  3 mL Intravenous Q12H   Continuous Infusions:  sodium chloride      cefTRIAXone (ROCEPHIN)  IV Stopped (19-Mar-2022 8916)   diltiazem (CARDIZEM) infusion 2.5 mg/hr (02/19/22 1700)   doxycycline (VIBRAMYCIN) IV Stopped (2022/03/19 1227)   PRN Meds:.sodium chloride, acetaminophen **OR** acetaminophen, bisacodyl, levalbuterol, LORazepam, morphine injection, ondansetron **OR** ondansetron (ZOFRAN) IV, sodium chloride flush, traZODone   Anti-infectives (From admission, onward)    Start     Dose/Rate Route Frequency Ordered Stop   02/18/22 1000  doxycycline (VIBRAMYCIN) 100 mg in sodium chloride 0.9 % 250 mL IVPB        100 mg 125 mL/hr over 120 Minutes Intravenous Every 12 hours 02/18/22 0815     02/18/22 0600  cefTRIAXone (ROCEPHIN) 2 g in sodium chloride 0.9 % 100 mL IVPB        2 g 200 mL/hr over 30 Minutes Intravenous Every 24 hours 03/04/2022 1824     02/23/2022 1915  fluconazole (DIFLUCAN) tablet 200 mg        200 mg Oral Daily 03/05/2022 1820 19-Mar-2022 0959   02/14/2022 0945  cefTRIAXone (ROCEPHIN) 1 g in sodium chloride 0.9 % 100 mL IVPB        1 g 200 mL/hr over 30 Minutes Intravenous  Once 02/23/2022 0930 02/13/2022 1015   02/11/2022 0945  azithromycin (ZITHROMAX) 500 mg in sodium chloride 0.9 % 250 mL IVPB  Status:  Discontinued        500 mg 250 mL/hr over 60 Minutes Intravenous Every 24 hours 02/25/2022 0930 02/18/22 0815        Subjective: [name] today has persistent and significant hypoxia - -Continues to require up to 15 L of high flow nasal cannula oxygen -Multiple family members at bedside --They request DNR status -Family requested that we do not escalate care -Continue current level of care without further escalation, Transitioning to comfort care -High likelihood of in-hospital death  Objective: Vitals:   03/19/2022 1400 03-19-22 1500 19-Mar-2022 1600 03-19-22 1700  BP: (!) 124/58 (!) 141/69 (!) 156/86 (!) 141/96  Pulse: 86 (!) 41 (!) 101 78  Resp: 16 (!) '27 19 17  '$ Temp:      TempSrc:      SpO2: 94% 92% 94% 98%  Weight:      Height:        No intake or output data in the 24 hours ending 03/19/22 1834  Filed Weights   03/03/2022 1348 02/18/22 0442 02/19/22 0325  Weight: 70.3 kg 72.9 kg 74.8 kg    Physical Exam Gen:-Episodes of confusion and lethargy  HEENT:- [place].AT, No sclera icterus Nose- [place] HFNC Ears-HOH Neck-Supple Neck,No JVD,.  Lungs-diminished breath sounds with scattered wheezes and rhonchi CV- S1, S2 normal, irregularly irregular, less tachycardic Abd-  +ve B.Sounds, Abd Soft, No tenderness,    Extremity/Skin:-Chronic bilateral lower extremity pitting edema, pedal pulses present  Psych-episodes of agitation and restlessness alternating with lethargy after medications  -neuro-generalized weakness and deconditioning, no new focal deficits, no tremors  Data Reviewed: I have personally reviewed following labs  and imaging studies  CBC: Recent Labs  Lab 02/26/2022 0646 02/18/22 0422 02/19/22 0429 02/20/22 0344  WBC 9.6 10.1 12.9* 13.7*  NEUTROABS 7.6  --   --   --   HGB 13.0 11.1* 11.0* 11.3*  HCT 38.9 33.6* 34.3* 34.4*  MCV 97.7 96.8 96.1 97.5  PLT 172 141* 146* 390   Basic Metabolic Panel: Recent Labs  Lab 03/05/2022 0646 02/26/2022 0853 02/18/22 0422 02/19/22 0429 02/20/22 0344  NA 131*  --  125* 121* 123*  K 4.2  --  4.5 4.1 4.8  CL 95*  --  93* 91* 94*  CO2 26  --  24 22 20*  GLUCOSE 135*  --  117* 156* 142*  BUN 13  --  20 21 27*  CREATININE 0.69  --  0.82 0.75 0.82  CALCIUM 8.7*  --  8.2* 8.4* 8.9  MG  --  1.9  --   --   --   PHOS  --   --   --  3.4  --    GFR: Estimated Creatinine Clearance: 38.8 mL/min (by C-G formula based on SCr of 0.82 mg/dL). Liver Function Tests: Recent Labs  Lab 03/05/2022 0646 02/19/22 0429  AST 21  --   ALT 17  --   ALKPHOS 93  --   BILITOT 1.0  --   PROT 6.8  --   ALBUMIN 3.5 3.0*   Cardiac Enzymes: No results for input(s): CKTOTAL, CKMB, CKMBINDEX, TROPONINI in the last 168 hours. BNP (last 3 results) No results for input(s): PROBNP in the last 8760  hours. HbA1C: No results for input(s): HGBA1C in the last 72 hours. Sepsis Labs: '@LABRCNTIP'$ (procalcitonin:4,lacticidven:4) ) Recent Results (from the past 240 hour(s))  Resp Panel by RT-PCR (Flu A&B, Covid) Nasopharyngeal Swab     Status: None   Collection Time: 02/18/2022  7:21 AM   Specimen: Nasopharyngeal Swab; Nasopharyngeal(NP) swabs in vial transport medium  Result Value Ref Range Status   SARS Coronavirus 2 by RT PCR NEGATIVE NEGATIVE Final    Comment: (NOTE) SARS-CoV-2 target nucleic acids are NOT DETECTED.  The SARS-CoV-2 RNA is generally detectable in upper respiratory specimens during the acute phase of infection. The lowest concentration of SARS-CoV-2 viral copies this assay can detect is 138 copies/mL. A negative result does not preclude SARS-Cov-2 infection and should not be used as the sole basis for treatment or other patient management decisions. A negative result may occur with  improper specimen collection/handling, submission of specimen other than nasopharyngeal swab, presence of viral mutation(s) within the areas targeted by this assay, and inadequate number of viral copies(<138 copies/mL). A negative result must be combined with clinical observations, patient history, and epidemiological information. The expected result is Negative.  Fact Sheet for Patients:  EntrepreneurPulse.com.au  Fact Sheet for Healthcare Providers:  IncredibleEmployment.be  This test is no t yet approved or cleared by the [place] FDA and  has been authorized for detection and/or diagnosis of SARS-CoV-2 by FDA under an Emergency Use Authorization (EUA). This EUA will remain  in effect (meaning this test can be used) for the duration of the COVID-19 declaration under Section 564(b)(1) of the Act, 21 U.S.C.section 360bbb-3(b)(1), unless the authorization is terminated  or revoked sooner.       Influenza A by PCR NEGATIVE NEGATIVE Final    Influenza B by PCR NEGATIVE NEGATIVE Final    Comment: (NOTE) The Xpert Xpress SARS-CoV-2/FLU/RSV plus assay is intended as an aid in the diagnosis of influenza  Lab Results  Component Value Date   PLT 159 19-Mar-2022   Inpatient Medications  Scheduled Meds:  Chlorhexidine Gluconate Cloth  6 each Topical Q0600   feeding supplement  237 mL Oral TID   mirtazapine  7.5 mg Oral QHS   OLANZapine zydis  2.5 mg Oral QHS   olopatadine  1 drop Both Eyes BID   sodium chloride flush  3 mL Intravenous Q12H   sodium chloride flush  3 mL Intravenous Q12H   Continuous Infusions:  sodium chloride      cefTRIAXone (ROCEPHIN)  IV Stopped (19-Mar-2022 8916)   diltiazem (CARDIZEM) infusion 2.5 mg/hr (02/19/22 1700)   doxycycline (VIBRAMYCIN) IV Stopped (2022/03/19 1227)   PRN Meds:.sodium chloride, acetaminophen **OR** acetaminophen, bisacodyl, levalbuterol, LORazepam, morphine injection, ondansetron **OR** ondansetron (ZOFRAN) IV, sodium chloride flush, traZODone   Anti-infectives (From admission, onward)    Start     Dose/Rate Route Frequency Ordered Stop   02/18/22 1000  doxycycline (VIBRAMYCIN) 100 mg in sodium chloride 0.9 % 250 mL IVPB        100 mg 125 mL/hr over 120 Minutes Intravenous Every 12 hours 02/18/22 0815     02/18/22 0600  cefTRIAXone (ROCEPHIN) 2 g in sodium chloride 0.9 % 100 mL IVPB        2 g 200 mL/hr over 30 Minutes Intravenous Every 24 hours 03/04/2022 1824     02/23/2022 1915  fluconazole (DIFLUCAN) tablet 200 mg        200 mg Oral Daily 03/05/2022 1820 19-Mar-2022 0959   02/14/2022 0945  cefTRIAXone (ROCEPHIN) 1 g in sodium chloride 0.9 % 100 mL IVPB        1 g 200 mL/hr over 30 Minutes Intravenous  Once 02/23/2022 0930 02/13/2022 1015   02/11/2022 0945  azithromycin (ZITHROMAX) 500 mg in sodium chloride 0.9 % 250 mL IVPB  Status:  Discontinued        500 mg 250 mL/hr over 60 Minutes Intravenous Every 24 hours 02/25/2022 0930 02/18/22 0815        Subjective: [name] today has persistent and significant hypoxia - -Continues to require up to 15 L of high flow nasal cannula oxygen -Multiple family members at bedside --They request DNR status -Family requested that we do not escalate care -Continue current level of care without further escalation, Transitioning to comfort care -High likelihood of in-hospital death  Objective: Vitals:   03/19/2022 1400 03-19-22 1500 19-Mar-2022 1600 03-19-22 1700  BP: (!) 124/58 (!) 141/69 (!) 156/86 (!) 141/96  Pulse: 86 (!) 41 (!) 101 78  Resp: 16 (!) '27 19 17  '$ Temp:      TempSrc:      SpO2: 94% 92% 94% 98%  Weight:      Height:        No intake or output data in the 24 hours ending 03/19/22 1834  Filed Weights   03/03/2022 1348 02/18/22 0442 02/19/22 0325  Weight: 70.3 kg 72.9 kg 74.8 kg    Physical Exam Gen:-Episodes of confusion and lethargy  HEENT:- [place].AT, No sclera icterus Nose- [place] HFNC Ears-HOH Neck-Supple Neck,No JVD,.  Lungs-diminished breath sounds with scattered wheezes and rhonchi CV- S1, S2 normal, irregularly irregular, less tachycardic Abd-  +ve B.Sounds, Abd Soft, No tenderness,    Extremity/Skin:-Chronic bilateral lower extremity pitting edema, pedal pulses present  Psych-episodes of agitation and restlessness alternating with lethargy after medications  -neuro-generalized weakness and deconditioning, no new focal deficits, no tremors  Data Reviewed: I have personally reviewed following labs  and imaging studies  CBC: Recent Labs  Lab 02/26/2022 0646 02/18/22 0422 02/19/22 0429 02/20/22 0344  WBC 9.6 10.1 12.9* 13.7*  NEUTROABS 7.6  --   --   --   HGB 13.0 11.1* 11.0* 11.3*  HCT 38.9 33.6* 34.3* 34.4*  MCV 97.7 96.8 96.1 97.5  PLT 172 141* 146* 390   Basic Metabolic Panel: Recent Labs  Lab 03/05/2022 0646 02/26/2022 0853 02/18/22 0422 02/19/22 0429 02/20/22 0344  NA 131*  --  125* 121* 123*  K 4.2  --  4.5 4.1 4.8  CL 95*  --  93* 91* 94*  CO2 26  --  24 22 20*  GLUCOSE 135*  --  117* 156* 142*  BUN 13  --  20 21 27*  CREATININE 0.69  --  0.82 0.75 0.82  CALCIUM 8.7*  --  8.2* 8.4* 8.9  MG  --  1.9  --   --   --   PHOS  --   --   --  3.4  --    GFR: Estimated Creatinine Clearance: 38.8 mL/min (by C-G formula based on SCr of 0.82 mg/dL). Liver Function Tests: Recent Labs  Lab 03/05/2022 0646 02/19/22 0429  AST 21  --   ALT 17  --   ALKPHOS 93  --   BILITOT 1.0  --   PROT 6.8  --   ALBUMIN 3.5 3.0*   Cardiac Enzymes: No results for input(s): CKTOTAL, CKMB, CKMBINDEX, TROPONINI in the last 168 hours. BNP (last 3 results) No results for input(s): PROBNP in the last 8760  hours. HbA1C: No results for input(s): HGBA1C in the last 72 hours. Sepsis Labs: '@LABRCNTIP'$ (procalcitonin:4,lacticidven:4) ) Recent Results (from the past 240 hour(s))  Resp Panel by RT-PCR (Flu A&B, Covid) Nasopharyngeal Swab     Status: None   Collection Time: 02/18/2022  7:21 AM   Specimen: Nasopharyngeal Swab; Nasopharyngeal(NP) swabs in vial transport medium  Result Value Ref Range Status   SARS Coronavirus 2 by RT PCR NEGATIVE NEGATIVE Final    Comment: (NOTE) SARS-CoV-2 target nucleic acids are NOT DETECTED.  The SARS-CoV-2 RNA is generally detectable in upper respiratory specimens during the acute phase of infection. The lowest concentration of SARS-CoV-2 viral copies this assay can detect is 138 copies/mL. A negative result does not preclude SARS-Cov-2 infection and should not be used as the sole basis for treatment or other patient management decisions. A negative result may occur with  improper specimen collection/handling, submission of specimen other than nasopharyngeal swab, presence of viral mutation(s) within the areas targeted by this assay, and inadequate number of viral copies(<138 copies/mL). A negative result must be combined with clinical observations, patient history, and epidemiological information. The expected result is Negative.  Fact Sheet for Patients:  EntrepreneurPulse.com.au  Fact Sheet for Healthcare Providers:  IncredibleEmployment.be  This test is no t yet approved or cleared by the [place] FDA and  has been authorized for detection and/or diagnosis of SARS-CoV-2 by FDA under an Emergency Use Authorization (EUA). This EUA will remain  in effect (meaning this test can be used) for the duration of the COVID-19 declaration under Section 564(b)(1) of the Act, 21 U.S.C.section 360bbb-3(b)(1), unless the authorization is terminated  or revoked sooner.       Influenza A by PCR NEGATIVE NEGATIVE Final    Influenza B by PCR NEGATIVE NEGATIVE Final    Comment: (NOTE) The Xpert Xpress SARS-CoV-2/FLU/RSV plus assay is intended as an aid in the diagnosis of influenza  Lab Results  Component Value Date   PLT 159 19-Mar-2022   Inpatient Medications  Scheduled Meds:  Chlorhexidine Gluconate Cloth  6 each Topical Q0600   feeding supplement  237 mL Oral TID   mirtazapine  7.5 mg Oral QHS   OLANZapine zydis  2.5 mg Oral QHS   olopatadine  1 drop Both Eyes BID   sodium chloride flush  3 mL Intravenous Q12H   sodium chloride flush  3 mL Intravenous Q12H   Continuous Infusions:  sodium chloride      cefTRIAXone (ROCEPHIN)  IV Stopped (19-Mar-2022 8916)   diltiazem (CARDIZEM) infusion 2.5 mg/hr (02/19/22 1700)   doxycycline (VIBRAMYCIN) IV Stopped (2022/03/19 1227)   PRN Meds:.sodium chloride, acetaminophen **OR** acetaminophen, bisacodyl, levalbuterol, LORazepam, morphine injection, ondansetron **OR** ondansetron (ZOFRAN) IV, sodium chloride flush, traZODone   Anti-infectives (From admission, onward)    Start     Dose/Rate Route Frequency Ordered Stop   02/18/22 1000  doxycycline (VIBRAMYCIN) 100 mg in sodium chloride 0.9 % 250 mL IVPB        100 mg 125 mL/hr over 120 Minutes Intravenous Every 12 hours 02/18/22 0815     02/18/22 0600  cefTRIAXone (ROCEPHIN) 2 g in sodium chloride 0.9 % 100 mL IVPB        2 g 200 mL/hr over 30 Minutes Intravenous Every 24 hours 03/04/2022 1824     02/23/2022 1915  fluconazole (DIFLUCAN) tablet 200 mg        200 mg Oral Daily 03/05/2022 1820 19-Mar-2022 0959   02/14/2022 0945  cefTRIAXone (ROCEPHIN) 1 g in sodium chloride 0.9 % 100 mL IVPB        1 g 200 mL/hr over 30 Minutes Intravenous  Once 02/23/2022 0930 02/13/2022 1015   02/11/2022 0945  azithromycin (ZITHROMAX) 500 mg in sodium chloride 0.9 % 250 mL IVPB  Status:  Discontinued        500 mg 250 mL/hr over 60 Minutes Intravenous Every 24 hours 02/25/2022 0930 02/18/22 0815        Subjective: [name] today has persistent and significant hypoxia - -Continues to require up to 15 L of high flow nasal cannula oxygen -Multiple family members at bedside --They request DNR status -Family requested that we do not escalate care -Continue current level of care without further escalation, Transitioning to comfort care -High likelihood of in-hospital death  Objective: Vitals:   03/19/2022 1400 03-19-22 1500 19-Mar-2022 1600 03-19-22 1700  BP: (!) 124/58 (!) 141/69 (!) 156/86 (!) 141/96  Pulse: 86 (!) 41 (!) 101 78  Resp: 16 (!) '27 19 17  '$ Temp:      TempSrc:      SpO2: 94% 92% 94% 98%  Weight:      Height:        No intake or output data in the 24 hours ending 03/19/22 1834  Filed Weights   03/03/2022 1348 02/18/22 0442 02/19/22 0325  Weight: 70.3 kg 72.9 kg 74.8 kg    Physical Exam Gen:-Episodes of confusion and lethargy  HEENT:- [place].AT, No sclera icterus Nose- [place] HFNC Ears-HOH Neck-Supple Neck,No JVD,.  Lungs-diminished breath sounds with scattered wheezes and rhonchi CV- S1, S2 normal, irregularly irregular, less tachycardic Abd-  +ve B.Sounds, Abd Soft, No tenderness,    Extremity/Skin:-Chronic bilateral lower extremity pitting edema, pedal pulses present  Psych-episodes of agitation and restlessness alternating with lethargy after medications  -neuro-generalized weakness and deconditioning, no new focal deficits, no tremors  Data Reviewed: I have personally reviewed following labs

## 2022-02-21 ENCOUNTER — Inpatient Hospital Stay (HOSPITAL_COMMUNITY): Payer: PPO

## 2022-02-21 DIAGNOSIS — J9601 Acute respiratory failure with hypoxia: Secondary | ICD-10-CM | POA: Diagnosis not present

## 2022-02-21 LAB — CBC
HCT: 36.7 % (ref 36.0–46.0)
Hemoglobin: 11.6 g/dL — ABNORMAL LOW (ref 12.0–15.0)
MCH: 31 pg (ref 26.0–34.0)
MCHC: 31.6 g/dL (ref 30.0–36.0)
MCV: 98.1 fL (ref 80.0–100.0)
Platelets: 158 10*3/uL (ref 150–400)
RBC: 3.74 MIL/uL — ABNORMAL LOW (ref 3.87–5.11)
RDW: 13.1 % (ref 11.5–15.5)
WBC: 12.6 10*3/uL — ABNORMAL HIGH (ref 4.0–10.5)
nRBC: 0 % (ref 0.0–0.2)

## 2022-02-21 LAB — PROTIME-INR
INR: 3.3 — ABNORMAL HIGH (ref 0.8–1.2)
Prothrombin Time: 33.9 seconds — ABNORMAL HIGH (ref 11.4–15.2)

## 2022-02-21 MED ORDER — LORAZEPAM 2 MG/ML IJ SOLN: 1.0000 mg | INTRAMUSCULAR | Status: DC | PRN

## 2022-02-21 MED ORDER — FENTANYL CITRATE PF 50 MCG/ML IJ SOSY
25.0000 ug | PREFILLED_SYRINGE | INTRAMUSCULAR | Status: DC | PRN
Start: 2022-02-21 — End: 2022-02-21
  Administered 2022-02-21: 25 ug via INTRAVENOUS
  Filled 2022-02-21: qty 1

## 2022-02-22 LAB — CULTURE, BLOOD (ROUTINE X 2)
Culture: NO GROWTH
Culture: NO GROWTH
Special Requests: ADEQUATE
Special Requests: ADEQUATE

## 2022-02-26 ENCOUNTER — Ambulatory Visit: Payer: PPO | Admitting: Cardiology

## 2022-03-13 NOTE — Progress Notes (Signed)
Patient time of death occurred at 83 on 03/16/2022.  Pronounced by Freda Jackson RN and verified by Sherry Ruffing RN.  Family notified at bedside.  MD notified via phone call. ?

## 2022-03-13 NOTE — Death Summary Note (Signed)
DEATH SUMMARY   Patient Details  Name: [name] MRN: [id number] DOB: 05-29-1921 LPF:[id number], Koleen Distance, DO Admission/Discharge Information   Admit Date:  03-08-2022  Date of Death: Date of Death: 03/12/22  Time of Death: Time of Death: 03-15-1052  Length of Stay: 4   Principle Cause of death: Bilateral Multi-Focal Pneumonia  Hospital Diagnoses: Principal Problem:   Acute respiratory failure with hypoxia [facility]) Active Problems:   Community acquired pneumonia, bilateral--??Aspiration   Chronic anticoagulation   Left ventricular diastolic dysfunction, NYHA class 2   Atrial fibrillation with RVR ([place])   Essential hypertension   Hospital Course: 88 y.o. female who is a reformed smoker with past medical history relevant for chronic A-fib, and history of DVT in the past, chronically on Coumadin for anticoagulation at baseline and Cardizem for rate control at baseline, as well as history of PAD, HTN, and HLD admitted on 2022/03/08 with Acute Hypoxic Resp failure due to bil PNA  Assessment and Plan:  1)Bilateral multifocal Pneumonia--- chest x-ray and CTA chest report noted -treated with iv Rocephin, azithromycin and later switched to doxycycline  -Treated with bronchodilators  -WBC and lactic acid WNL -Blood cultures NGTD -Pulmonology consult from Dr. Kara Mead appreciated -Strep and Legionella antigen Neg -Patient required up to 15 L of high flow nasal cannula oxygen in addition to nonrebreather with -Repeat chest x-ray on 02/20/2022 shows bilateral interstitial opacities and bilateral effusions -Pulmonology consult appreciated -Family requested that we do Not escalate care 03-12-2022--- patient pulled off oxygen , family requested that we do not put it back on , family requested transition to Full comfort care -Patient expired at 10:53 AM with family at bedside    2) acute hypoxic respiratory failure--- secondary to #1 above   3) A-fib with RVR-- h/o  chronic A-fib -RVR  most likely due to #1 and #2 above -Treated with Cardizem for rate control--- -Echo with EF of 60 to 65%, severe pulmonary hypertension, moderate aortic stenosis, moderate right atrial and severe left atrial enlargement --TSH WNL, potassium and magnesium WNL -Troponin is flat at 18 -EKG A-fib with RVR -INR therapeutic --Patient expired at 10:53 AM with family at bedside   4)Social/ethics--plan of care and advanced directive discussed with patient and patient's daughters x 2  --Official palliative care consult appreciated -They request DNR status -Family requested that we do not escalate care -March 12, 2022--- patient pulled off oxygen , family requested that we do not put it back on , family requested transition to Full comfort care --Patient expired at 10:53 AM with family at bedside  5)HypoNatremia--query SIADH related to PNA compounded by Gi losses in the setting of emesis and poor oral intake due to shortness of breath --Patient expired at 10:53 AM with family at bedside  Consultations: PCCM and palliative care  The results of significant diagnostics from this hospitalization (including imaging, microbiology, ancillary and laboratory) are listed below for reference.   Significant Diagnostic Studies: CT Abdomen Pelvis Wo Contrast  Result Date: 01/29/2022 CLINICAL DATA:  Abdominal pain acute, nonlocalized, diarrhea. Diffuse tenderness and diarrhea for 4 days. EXAM: CT ABDOMEN AND PELVIS WITHOUT CONTRAST TECHNIQUE: Multidetector CT imaging of the abdomen and pelvis was performed following the standard protocol without IV contrast. RADIATION DOSE REDUCTION: This exam was performed according to the departmental dose-optimization program which includes automated exposure control, adjustment of the mA and/or kV according to patient size and/or use of iterative reconstruction technique. COMPARISON:  CT examination dated December 18, 2019 FINDINGS: Lower chest: Fibrotic changes  in the bilateral lung  bases with subsegmental atelectasis. Coronary artery atherosclerotic calcifications. Hepatobiliary: No focal liver abnormality is seen. No gallstones, gallbladder wall thickening, or biliary dilatation. Pancreas: Unremarkable. No pancreatic ductal dilatation or surrounding inflammatory changes. Spleen: Normal in size without focal abnormality. Adrenals/Urinary Tract: Adrenal glands are unremarkable. Large left renal cyst measuring up to 6.6 cm, unchanged. No evidence of nephrolithiasis or hydronephrosis. Bladder is unremarkable. Stomach/Bowel: Stomach is within normal limits. Appendix appears normal. Colonic wall thickening with mucosal enhancement in the ascending and proximal transverse colon concerning for colitis. Colonic diverticulosis prominent in the sigmoid colon without evidence of acute diverticulitis. Vascular/Lymphatic: Advanced atherosclerotic disease of abdominal aorta. Aneurysmal dilatation of the infrarenal abdominal aorta measuring up to 2.9 cm. No enlarged abdominal or pelvic lymph nodes. Reproductive: Status post hysterectomy. No adnexal masses. Other: No abdominal wall hernia or abnormality. No abdominopelvic ascites. Musculoskeletal: S shaped kyphoscoliosis of the thoracolumbar spine with advanced multilevel degenerate disc disease. Advanced right hip osteoarthritis with bone-on-bone articulation and small reactive joint effusion. Diffuse osteopenia. IMPRESSION: 1. Mild colonic wall thickening with mucosal enhancement of the ascending and transverse colon concerning for colitis, likely infectious process. 2. Marked colonic diverticulosis without evidence of acute diverticulitis. Normal appendix. 3. Stable aneurysmal dilatation of the abdominal aorta measuring up to 3 cm with advanced atherosclerotic disease. Follow-up examination with annual ultrasound is recommended. 4. Advanced degenerate disc disease of the thoracolumbar spine with S shaped scoliosis. Advanced right hip osteoarthritis with  small joint effusion. Diffuse osteopenia. No appreciable acute fracture or subluxation. 5.  Additional chronic findings as above. Electronically Signed   By: Keane Police D.O.   On: 01/29/2022 14:49   CT Chest Wo Contrast  Result Date: 03/07/2022 CLINICAL DATA:  Chest pain, shortness of breath. EXAM: CT CHEST WITHOUT CONTRAST TECHNIQUE: Multidetector CT imaging of the chest was performed following the standard protocol without IV contrast. RADIATION DOSE REDUCTION: This exam was performed according to the departmental dose-optimization program which includes automated exposure control, adjustment of the mA and/or kV according to patient size and/or use of iterative reconstruction technique. COMPARISON:  November 23, 2013. FINDINGS: Cardiovascular: Atherosclerosis of thoracic aorta is noted. 4.3 cm ascending thoracic aortic aneurysm is noted. Mild cardiomegaly is noted. No pericardial effusion is noted. Mediastinum/Nodes: No enlarged mediastinal or axillary lymph nodes. Thyroid gland, trachea, and esophagus demonstrate no significant findings. Lungs/Pleura: Small bilateral pleural effusions are noted. No pneumothorax is noted. Multiple patchy airspace opacities are noted in both lungs, right greater than left, concerning for multifocal pneumonia. Upper Abdomen: No acute abnormality. Musculoskeletal: No chest wall mass or suspicious bone lesions identified. IMPRESSION: Multiple patchy airspace opacities are noted in both lungs, right greater than left, consistent with multifocal pneumonia. Small bilateral pleural effusions are noted. 4.3 cm ascending thoracic aortic aneurysm. Recommend annual imaging followup by CTA or MRA. This recommendation follows 2010 ACCF/AHA/AATS/ACR/ASA/SCA/SCAI/SIR/STS/SVM Guidelines for the Diagnosis and Management of Patients with Thoracic Aortic Disease. Circulation. 2010; 121: U828-M034. Aortic aneurysm NOS (ICD10-I71.9). Aortic Atherosclerosis (ICD10-I70.0). Electronically Signed    By: Marijo Conception M.D.   On: 03/02/2022 08:57   DG CHEST PORT 1 VIEW  Result Date: 2022-03-18 CLINICAL DATA:  88 year old female with history of pneumonia. EXAM: PORTABLE CHEST 1 VIEW COMPARISON:  Chest x-ray 02/20/2022. FINDINGS: Lung volumes remain low. Patchy multifocal interstitial opacities and areas of airspace consolidation are again noted in the lungs bilaterally, asymmetrically distributed (right greater than left), compatible with severe multilobar bilateral pneumonia. Moderate left and small right pleural effusions.  No pneumothorax. Heart size is borderline enlarged. Upper mediastinal contours are within normal limits. Atherosclerotic calcifications are noted in the thoracic aorta. IMPRESSION: 1. Similar severe multilobar bilateral pneumonia with moderate left and small right parapneumonic pleural effusions. 2. Aortic atherosclerosis. Electronically Signed   By: Vinnie Langton M.D.   On: Mar 22, 2022 08:10   DG CHEST PORT 1 VIEW  Result Date: 02/20/2022 CLINICAL DATA:  88 year old female with pneumonia, chest pain and shortness of breath EXAM: PORTABLE CHEST 1 VIEW COMPARISON:  02/15/2022 CT and chest radiograph FINDINGS: Cardiomegaly again noted. Slightly enlarging bilateral pleural effusions are noted. Increasing central RIGHT lung airspace disease/consolidation identified. Other airspace and interstitial opacities are relatively unchanged. No evidence of pneumothorax or acute bony abnormality. IMPRESSION: 1. Increasing central RIGHT lung airspace disease/consolidation, question increasing edema versus infection. 2. Slightly enlarging bilateral pleural effusions. 3. Other airspace and interstitial opacities/edema again noted. Electronically Signed   By: Margarette Canada M.D.   On: 02/20/2022 09:34   DG Chest Port 1 View  Result Date: 03/07/2022 CLINICAL DATA:  Chest pain with shortness of breath. Read sputum and vomiting. EXAM: PORTABLE CHEST 1 VIEW COMPARISON:  12/30/2021 FINDINGS:  Cardiomegaly. Small pleural effusions and diffuse interstitial coarsening with indistinct airspace type density at the bases. Distorted aortic and hilar contours from rotation. IMPRESSION: 1. Interstitial coarsening with indistinct density at the bases favoring infection or aspiration over CHF. 2. Cardiomegaly with small pleural effusions. Electronically Signed   By: Jorje Guild M.D.   On: 03/07/2022 07:14   ECHOCARDIOGRAM COMPLETE  Result Date: 02/18/2022    ECHOCARDIOGRAM REPORT   Patient Name:   ARYA LUTTRULL [facility] Date of Exam: 02/18/2022 Medical Rec #:  [id number]           Height:       65.0 in Accession #:    [id number]          Weight:       160.7 lb Date of Birth:  15-Aug-1921          BSA:          1.803 m Patient Age:    31 years            BP:           141/71 mmHg Patient Gender: F                   HR:           98 bpm. Exam Location:  [facility] Procedure: 2D Echo, Cardiac Doppler and Color Doppler Indications:    Abnormal ECG  History:        Patient has prior history of Echocardiogram examinations, most                 recent 03/22/2018. Arrythmias:Atrial Fibrillation; Risk                 Factors:Hypertension and Dyslipidemia. AAA.  Sonographer:    Wenda Low Referring Phys: [place]  1. Left ventricular ejection fraction, by estimation, is 60 to 65%. The left ventricle has normal function. The left ventricle has no regional wall motion abnormalities. There is mild left ventricular hypertrophy. Left ventricular diastolic parameters are indeterminate.  2. Right ventricular systolic function is normal. The right ventricular size is normal. There is severely elevated pulmonary artery systolic pressure.  3. Left atrial size was severely dilated.  4. Right atrial size was moderately dilated.  5. The mitral valve is degenerative. Mild mitral  valve regurgitation. No evidence of mitral stenosis. Moderate mitral annular calcification.  6. Tricuspid valve regurgitation is  moderate.  7. The aortic valve is tricuspid. There is moderate calcification of the aortic valve. There is moderate thickening of the aortic valve. Aortic valve regurgitation is trivial. Moderate aortic valve stenosis.  8. The inferior vena cava is normal in size with greater than 50% respiratory variability, suggesting right atrial pressure of 3 mmHg. FINDINGS  Left Ventricle: Left ventricular ejection fraction, by estimation, is 60 to 65%. The left ventricle has normal function. The left ventricle has no regional wall motion abnormalities. The left ventricular internal cavity size was normal in size. There is  mild left ventricular hypertrophy. Left ventricular diastolic parameters are indeterminate. Right Ventricle: The right ventricular size is normal. No increase in right ventricular wall thickness. Right ventricular systolic function is normal. There is severely elevated pulmonary artery systolic pressure. The tricuspid regurgitant velocity is 3.47 m/s, and with an assumed right atrial pressure of 15 mmHg, the estimated right ventricular systolic pressure is 60.1 mmHg. Left Atrium: Left atrial size was severely dilated. Right Atrium: Right atrial size was moderately dilated. Pericardium: There is no evidence of pericardial effusion. Mitral Valve: The mitral valve is degenerative in appearance. There is moderate thickening of the mitral valve leaflet(s). There is moderate calcification of the mitral valve leaflet(s). Moderate mitral annular calcification. Mild mitral valve regurgitation. No evidence of mitral valve stenosis. MV peak gradient, 16.5 mmHg. The mean mitral valve gradient is 2.0 mmHg. Tricuspid Valve: The tricuspid valve is normal in structure. Tricuspid valve regurgitation is moderate . No evidence of tricuspid stenosis. Aortic Valve: The aortic valve is tricuspid. There is moderate calcification of the aortic valve. There is moderate thickening of the aortic valve. Aortic valve regurgitation is  trivial. Moderate aortic stenosis is present. Aortic valve mean gradient measures 12.8 mmHg. Aortic valve peak gradient measures 28.4 mmHg. Aortic valve area, by VTI measures 0.98 cm. Pulmonic Valve: The pulmonic valve was normal in structure. Pulmonic valve regurgitation is trivial. No evidence of pulmonic stenosis. Aorta: The aortic root is normal in size and structure. Venous: The inferior vena cava is normal in size with greater than 50% respiratory variability, suggesting right atrial pressure of 3 mmHg. IAS/Shunts: No atrial level shunt detected by color flow Doppler.  LEFT VENTRICLE PLAX 2D LVIDd:         4.50 cm   Diastology LVIDs:         3.30 cm   LV e' medial:    7.72 cm/s LV PW:         1.40 cm   LV E/e' medial:  24.5 LV IVS:        1.40 cm   LV e' lateral:   9.36 cm/s LVOT diam:     2.00 cm   LV E/e' lateral: 20.2 LV SV:         52 LV SV Index:   29 LVOT Area:     3.14 cm  RIGHT VENTRICLE RV Basal diam:  4.00 cm RV Mid diam:    3.00 cm RV S prime:     11.10 cm/s TAPSE (M-mode): 2.4 cm LEFT ATRIUM              Index        RIGHT ATRIUM           Index LA diam:        5.30 cm  2.94 cm/m   RA Area:  28.20 cm LA Vol (A2C):   169.0 ml 93.76 ml/m  RA Volume:   91.60 ml  50.82 ml/m LA Vol (A4C):   160.0 ml 88.76 ml/m LA Biplane Vol: 175.0 ml 97.08 ml/m  AORTIC VALVE                     PULMONIC VALVE AV Area (Vmax):    1.09 cm      PV Vmax:       0.75 m/s AV Area (Vmean):   1.17 cm      PV Peak grad:  2.2 mmHg AV Area (VTI):     0.98 cm AV Vmax:           266.25 cm/s AV Vmean:          158.500 cm/s AV VTI:            0.528 m AV Peak Grad:      28.4 mmHg AV Mean Grad:      12.8 mmHg LVOT Vmax:         92.80 cm/s LVOT Vmean:        59.000 cm/s LVOT VTI:          0.165 m LVOT/AV VTI ratio: 0.31  AORTA Ao Root diam: 3.00 cm Ao Asc diam:  3.10 cm MITRAL VALVE                TRICUSPID VALVE MV Area (PHT): 6.90 cm     TR Peak grad:   48.2 mmHg MV Area VTI:   1.22 cm     TR Vmax:        347.00 cm/s MV  Peak grad:  16.5 mmHg MV Mean grad:  2.0 mmHg     SHUNTS MV Vmax:       2.03 m/s     Systemic VTI:  0.16 m MV Vmean:      53.2 cm/s    Systemic Diam: 2.00 cm MV Decel Time: 110 msec MV E velocity: 189.00 cm/s Jenkins Rouge MD Electronically signed by Jenkins Rouge MD Signature Date/Time: 02/18/2022/2:23:31 PM    Final     Microbiology: Recent Results (from the past 240 hour(s))  Resp Panel by RT-PCR (Flu A&B, Covid) Nasopharyngeal Swab     Status: None   Collection Time: 02/19/2022  7:21 AM   Specimen: Nasopharyngeal Swab; Nasopharyngeal(NP) swabs in vial transport medium  Result Value Ref Range Status   SARS Coronavirus 2 by RT PCR NEGATIVE NEGATIVE Final    Comment: (NOTE) SARS-CoV-2 target nucleic acids are NOT DETECTED.  The SARS-CoV-2 RNA is generally detectable in upper respiratory specimens during the acute phase of infection. The lowest concentration of SARS-CoV-2 viral copies this assay can detect is 138 copies/mL. A negative result does not preclude SARS-Cov-2 infection and should not be used as the sole basis for treatment or other patient management decisions. A negative result may occur with  improper specimen collection/handling, submission of specimen other than nasopharyngeal swab, presence of viral mutation(s) within the areas targeted by this assay, and inadequate number of viral copies(<138 copies/mL). A negative result must be combined with clinical observations, patient history, and epidemiological information. The expected result is Negative.  Fact Sheet for Patients:  EntrepreneurPulse.com.au  Fact Sheet for Healthcare Providers:  IncredibleEmployment.be  This test is no t yet approved or cleared by the [place] FDA and  has been authorized for detection and/or diagnosis of SARS-CoV-2 by FDA under an Emergency Use Authorization (EUA). This EUA  will remain  in effect (meaning this test can be used) for the duration of  the COVID-19 declaration under Section 564(b)(1) of the Act, 21 U.S.C.section 360bbb-3(b)(1), unless the authorization is terminated  or revoked sooner.       Influenza A by PCR NEGATIVE NEGATIVE Final   Influenza B by PCR NEGATIVE NEGATIVE Final    Comment: (NOTE) The Xpert Xpress SARS-CoV-2/FLU/RSV plus assay is intended as an aid in the diagnosis of influenza from Nasopharyngeal swab specimens and should not be used as a sole basis for treatment. Nasal washings and aspirates are unacceptable for Xpert Xpress SARS-CoV-2/FLU/RSV testing.  Fact Sheet for Patients: EntrepreneurPulse.com.au  Fact Sheet for Healthcare Providers: IncredibleEmployment.be  This test is not yet approved or cleared by the [place] FDA and has been authorized for detection and/or diagnosis of SARS-CoV-2 by FDA under an Emergency Use Authorization (EUA). This EUA will remain in effect (meaning this test can be used) for the duration of the COVID-19 declaration under Section 564(b)(1) of the Act, 21 U.S.C. section 360bbb-3(b)(1), unless the authorization is terminated or revoked.  Performed at [facility] [address] [place], [place] [address]   Culture, blood (routine x 2)     Status: None (Preliminary result)   Collection Time: 03/06/2022  7:32 AM   Specimen: BLOOD RIGHT FOREARM  Result Value Ref Range Status   Specimen Description BLOOD RIGHT FOREARM  Final   Special Requests   Final    BOTTLES DRAWN AEROBIC AND ANAEROBIC Blood Culture adequate volume   Culture   Final    NO GROWTH 4 DAYS Performed at [facility], [address] [place], [place] [address]    Report Status PENDING  Incomplete  Culture, blood (routine x 2)     Status: None (Preliminary result)   Collection Time: 02/18/2022  7:38 AM   Specimen: Left Antecubital; Blood  Result Value Ref Range Status   Specimen Description LEFT ANTECUBITAL  Final   Special Requests   Final    BOTTLES  DRAWN AEROBIC AND ANAEROBIC Blood Culture adequate volume   Culture   Final    NO GROWTH 4 DAYS Performed at [facility], [address] [place], [place] [address]    Report Status PENDING  Incomplete  MRSA Next Gen by PCR, Nasal     Status: None   Collection Time: 03/01/2022  2:00 PM   Specimen: Nasal Mucosa; Nasal Swab  Result Value Ref Range Status   MRSA by PCR Next Gen NOT DETECTED NOT DETECTED Final    Comment: (NOTE) The GeneXpert MRSA Assay (FDA approved for NASAL specimens only), is one component of a comprehensive MRSA colonization surveillance program. It is not intended to diagnose MRSA infection nor to guide or monitor treatment for MRSA infections. Test performance is not FDA approved in patients less than 53 years old. Performed at [facility], [address], [place],  [address]     Time spent: 36 minutes  Signed: Roxan Hockey, MD Mar 01, 2022

## 2022-03-13 NOTE — Progress Notes (Signed)
Family states patient took off oxygen and doesn't want oxygen supplementation or "anything, any more medicine".  Patient O2 saturation found to be in the 50s.  Hospitalist notified.   ?

## 2022-03-13 NOTE — Progress Notes (Signed)
ANTICOAGULATION CONSULT NOTE -  ? ?Pharmacy Consult for warfarin ?Indication: atrial fibrillation ? ?Allergies  ?Allergen Reactions  ? Contrast Media [Iodinated Contrast Media] Hives  ? Amoxicillin Itching  ? Ampicillin Itching  ? Cefdinir Nausea And Vomiting  ? Cortisol [Hydrocortisone] Itching  ? Ivp Dye [Iodinated Contrast Media] Hives  ? Levaquin [Levofloxacin] Itching and Rash  ? Penicillins Rash  ?  "haven't had it in years"  ? Vancomycin Itching and Rash  ? ? ?Patient Measurements: ?Height: '5\' 5"'$  (165.1 cm) ?Weight: 74.8 kg (164 lb 14.5 oz) ?IBW/kg (Calculated) : 57 ?Heparin Dosing Weight:  ? ?Vital Signs: ?Temp: 97.2 ?F (36.2 ?C) (03/12 0415) ?Temp Source: Axillary (03/12 0415) ?BP: 172/81 (03/12 0700) ?Pulse Rate: 54 (03/12 0700) ? ?Labs: ?Recent Labs  ?  02/19/22 ?0429 02/20/22 ?5885 02/23/2022 ?0401  ?HGB 11.0* 11.3* 11.6*  ?HCT 34.3* 34.4* 36.7  ?PLT 146* 159 158  ?LABPROT 25.7* 26.9* 33.9*  ?INR 2.4* 2.5* 3.3*  ?CREATININE 0.75 0.82  --   ? ? ? ?Estimated Creatinine Clearance: 38.8 mL/min (by C-G formula based on SCr of 0.82 mg/dL). ? ? ?Medical History: ?Past Medical History:  ?Diagnosis Date  ? AAA (abdominal aortic aneurysm)   ? Allergy   ? Anxiety   ? Bladder cancer ([place]) dx'd 2011  ? Chronic microscopic hematuria; transitional cell cancer  ? Blood transfusion 1960  ? "related to hysterectomy"  ? Chronic atrial fibrillation (HCC)   ? Anticoagulated with warfarin, rate control with diltiazem and Toprol  ? Chronic back pain   ? Chronic headache   ? "probably 2/wk" (08/28/2015)  ? Concussion w/o coma 03/31/2004  ? Complicated by subarachnoid hemorrhage.  "even now has times when she's not able to comprehend" (05/08/12)  ? Coronary artery disease   ? Nonobstructive  ? Diverticulitis   ? Diverticulosis   ? DVT (deep venous thrombosis), right 2005  ? "right calf after 8 foot fall"  ? Edema of both legs   ? Chronic, thought to be secondary to DVTs  ? External hemorrhoids   ? Family history of adverse reaction  to anesthesia 2012  ? daughter "had OR for crushed hand; had problems w/anesthesia & I was in there all day long" (  ? Fatty liver   ? Frequent UTI   ? GERD (gastroesophageal reflux disease)   ? H/O hiatal hernia   ? High cholesterol   ? Migraine   ? "rare now" (08/28/2015)  ? Pneumonia ~ 2010;  2013; 07/2015  ? Rheumatoid arthritis(714.0)   ? "hands" (08/28/2015)  ? Urinary frequency   ? ? ?Medications:  ?Medications Prior to Admission  ?Medication Sig Dispense Refill Last Dose  ? acetaminophen (TYLENOL) 500 MG tablet Take 500 mg by mouth every 6 (six) hours as needed for mild pain or moderate pain.   unk  ? atorvastatin (LIPITOR) 10 MG tablet TAKE 1 TABLET BY MOUTH EVERY DAY 90 tablet 0 02/16/2022  ? calcium carbonate (OS-CAL - DOSED IN MG OF ELEMENTAL CALCIUM) 1250 MG tablet Take 1 tablet by mouth every morning.    02/16/2022  ? cholecalciferol (VITAMIN D) 1000 UNITS tablet Take 1,000 Units by mouth daily.   02/16/2022  ? DILT-XR 240 MG 24 hr capsule TAKE 1 CAPSULE BY MOUTH EVERY MORNING ON AN EMPTY STOMACH 90 capsule 0 02/16/2022  ? furosemide (LASIX) 20 MG tablet TAKE ONE TO TWO TABLETS EVERY DAY AS INSTRUCTED 180 tablet 1 02/16/2022  ? loratadine (CLARITIN) 10 MG tablet TAKE 1 TABLET (10  MG TOTAL) BY MOUTH DAILY AS NEEDED FOR ALLERGIES OR ITCHING. 90 tablet 1 02/16/2022  ? Multiple Vitamins-Minerals (PRESERVISION AREDS 2+MULTI VIT PO) Take by mouth.   02/16/2022  ? olopatadine (PATANOL) 0.1 % ophthalmic solution PLACE 1 DROP IN AFFECTED EYE(S) TWICE DAILY (Patient taking differently: Place 1 drop into both eyes 2 (two) times daily.) 15 mL 5 02/16/2022  ? Omega-3 Fatty Acids (FISH OIL) 1000 MG CAPS Take 1,000 mg by mouth daily.   02/16/2022  ? omeprazole (PRILOSEC) 20 MG capsule TAKE 1 CAPSULE BY MOUTH EVERY DAY 90 capsule 1 02/16/2022  ? ondansetron (ZOFRAN-ODT) 4 MG disintegrating tablet TAKE 1 TABLET BY MOUTH EVERY 8 HOURS AS NEEDED FOR NAUSEA 10 tablet 1 Past Week  ? Vibegron (GEMTESA) 75 MG TABS Take 1 tablet by mouth daily. 30  tablet 3 02/16/2022  ? warfarin (COUMADIN) 3 MG tablet TAKE 1 TABLET BY MOUTH EVERY DAY EXCEPT TAKE 2 ON MONDAY, WEDNESDAY, FRIDAY, AND SATURDAY 140 tablet 2 02/16/2022 at 0900  ? baclofen (LIORESAL) 10 MG tablet Take 1 tablet (10 mg total) by mouth 3 (three) times daily. (Patient not taking: Reported on 03/02/2022) 60 each 0 Not Taking  ? mirtazapine (REMERON) 7.5 MG tablet TAKE 1 TABLET BY MOUTH EVERYDAY AT BEDTIME (Patient not taking: Reported on 03/02/2022) 90 tablet 0 Not Taking  ? Misc. Devices (TOILET SAFETY FRAME) MISC UAD 1 each 0   ? PROAIR HFA 108 (90 Base) MCG/ACT inhaler TAKE 2 PUFFS BY MOUTH EVERY 6 HOURS AS NEEDED FOR WHEEZE OR SHORTNESS OF BREATH (Patient not taking: Reported on 02/10/2022) 8.5 Inhaler 4 Not Taking  ? ? ?Assessment: ?Pharmacy consulted to dose warfarin in patient with atrial fibrillation.  Patient's home dose listed as 6 mg MWF and 3 mg ROW.  Patient missed 3/8 dose due to being NPO. ? ?Patient on fluconazole- consider dose reduction of warfarin during use. Completed course 3/10 ? ?INR 2.4>2.5>3.3 ?CBC WNL ? ?3/11: patient did not receive 3/10 dose ? ?Goal of Therapy:  ?INR 2-3 ?Monitor platelets by anticoagulation protocol: Yes ?  ?Plan:  ?Hold warfarin x 1 dose. ?Monitor daily INR and s/s of bleeding. ? ?Margot Ables, PharmD ?Clinical Pharmacist ?03/23/22 7:49 AM ? ? ? ?

## 2022-03-13 NOTE — Discharge Summary (Signed)
- ?  Please see full death summary  ?- ?-Patient expired at 10:53 AM on 2022/03/14 with family at bedside ? ? ?Please see full death summary  ?- ?Roxan Hockey, MD ? ?

## 2022-03-13 DEATH — deceased
